# Patient Record
Sex: Female | Born: 1950 | Race: White | Hispanic: No | Smoking: Former smoker
Health system: Southern US, Community
[De-identification: ages and names within clinical notes are randomized; demographics above are authoritative.]

## PROBLEM LIST (undated history)

## (undated) DIAGNOSIS — F419 Anxiety disorder, unspecified: Secondary | ICD-10-CM

## (undated) DIAGNOSIS — R51 Headache: Secondary | ICD-10-CM

## (undated) DIAGNOSIS — M549 Dorsalgia, unspecified: Secondary | ICD-10-CM

## (undated) DIAGNOSIS — C50919 Malignant neoplasm of unspecified site of unspecified female breast: Secondary | ICD-10-CM

## (undated) DIAGNOSIS — K2210 Ulcer of esophagus without bleeding: Secondary | ICD-10-CM

## (undated) DIAGNOSIS — F410 Panic disorder [episodic paroxysmal anxiety] without agoraphobia: Secondary | ICD-10-CM

## (undated) DIAGNOSIS — K602 Anal fissure, unspecified: Secondary | ICD-10-CM

## (undated) DIAGNOSIS — H269 Unspecified cataract: Secondary | ICD-10-CM

## (undated) DIAGNOSIS — H409 Unspecified glaucoma: Secondary | ICD-10-CM

## (undated) DIAGNOSIS — K5730 Diverticulosis of large intestine without perforation or abscess without bleeding: Secondary | ICD-10-CM

## (undated) DIAGNOSIS — I2510 Atherosclerotic heart disease of native coronary artery without angina pectoris: Secondary | ICD-10-CM

## (undated) DIAGNOSIS — R519 Headache, unspecified: Secondary | ICD-10-CM

## (undated) DIAGNOSIS — M5416 Radiculopathy, lumbar region: Secondary | ICD-10-CM

## (undated) DIAGNOSIS — G8929 Other chronic pain: Secondary | ICD-10-CM

## (undated) DIAGNOSIS — M48061 Spinal stenosis, lumbar region without neurogenic claudication: Secondary | ICD-10-CM

## (undated) DIAGNOSIS — E785 Hyperlipidemia, unspecified: Secondary | ICD-10-CM

## (undated) DIAGNOSIS — B3781 Candidal esophagitis: Secondary | ICD-10-CM

## (undated) DIAGNOSIS — K2970 Gastritis, unspecified, without bleeding: Secondary | ICD-10-CM

## (undated) DIAGNOSIS — K589 Irritable bowel syndrome without diarrhea: Secondary | ICD-10-CM

## (undated) DIAGNOSIS — K259 Gastric ulcer, unspecified as acute or chronic, without hemorrhage or perforation: Secondary | ICD-10-CM

## (undated) DIAGNOSIS — K219 Gastro-esophageal reflux disease without esophagitis: Secondary | ICD-10-CM

## (undated) DIAGNOSIS — Z923 Personal history of irradiation: Secondary | ICD-10-CM

## (undated) DIAGNOSIS — R17 Unspecified jaundice: Secondary | ICD-10-CM

## (undated) DIAGNOSIS — G629 Polyneuropathy, unspecified: Secondary | ICD-10-CM

## (undated) DIAGNOSIS — Z9221 Personal history of antineoplastic chemotherapy: Secondary | ICD-10-CM

## (undated) DIAGNOSIS — N189 Chronic kidney disease, unspecified: Secondary | ICD-10-CM

## (undated) DIAGNOSIS — F32A Depression, unspecified: Secondary | ICD-10-CM

## (undated) DIAGNOSIS — W540XXA Bitten by dog, initial encounter: Secondary | ICD-10-CM

## (undated) DIAGNOSIS — T7491XA Unspecified adult maltreatment, confirmed, initial encounter: Secondary | ICD-10-CM

## (undated) DIAGNOSIS — F329 Major depressive disorder, single episode, unspecified: Secondary | ICD-10-CM

## (undated) DIAGNOSIS — R52 Pain, unspecified: Secondary | ICD-10-CM

## (undated) DIAGNOSIS — G4730 Sleep apnea, unspecified: Secondary | ICD-10-CM

## (undated) DIAGNOSIS — K449 Diaphragmatic hernia without obstruction or gangrene: Secondary | ICD-10-CM

## (undated) DIAGNOSIS — I639 Cerebral infarction, unspecified: Secondary | ICD-10-CM

## (undated) DIAGNOSIS — I10 Essential (primary) hypertension: Secondary | ICD-10-CM

## (undated) DIAGNOSIS — E876 Hypokalemia: Secondary | ICD-10-CM

## (undated) HISTORY — DX: Diverticulosis of large intestine without perforation or abscess without bleeding: K57.30

## (undated) HISTORY — PX: BREAST RECONSTRUCTION: SHX9

## (undated) HISTORY — DX: Malignant neoplasm of unspecified site of unspecified female breast: C50.919

## (undated) HISTORY — DX: Unspecified cataract: H26.9

## (undated) HISTORY — DX: Spinal stenosis, lumbar region without neurogenic claudication: M48.061

## (undated) HISTORY — PX: BREAST LUMPECTOMY: SHX2

## (undated) HISTORY — DX: Atherosclerotic heart disease of native coronary artery without angina pectoris: I25.10

## (undated) HISTORY — DX: Unspecified glaucoma: H40.9

## (undated) HISTORY — PX: CATARACT EXTRACTION: SUR2

## (undated) HISTORY — DX: Gastric ulcer, unspecified as acute or chronic, without hemorrhage or perforation: K25.9

## (undated) HISTORY — DX: Irritable bowel syndrome, unspecified: K58.9

## (undated) HISTORY — DX: Anal fissure, unspecified: K60.2

## (undated) HISTORY — DX: Chronic kidney disease, unspecified: N18.9

## (undated) HISTORY — PX: CARDIAC CATHETERIZATION: SHX172

## (undated) HISTORY — DX: Gastritis, unspecified, without bleeding: K29.70

## (undated) HISTORY — DX: Gastro-esophageal reflux disease without esophagitis: K21.9

## (undated) HISTORY — PX: BREAST REDUCTION SURGERY: SHX8

## (undated) HISTORY — DX: Panic disorder (episodic paroxysmal anxiety): F41.0

## (undated) HISTORY — DX: Bitten by dog, initial encounter: W54.0XXA

## (undated) HISTORY — DX: Sleep apnea, unspecified: G47.30

## (undated) HISTORY — DX: Anxiety disorder, unspecified: F41.9

## (undated) HISTORY — DX: Diaphragmatic hernia without obstruction or gangrene: K44.9

## (undated) HISTORY — PX: CHOLECYSTECTOMY: SHX55

## (undated) HISTORY — PX: REDUCTION MAMMAPLASTY: SUR839

## (undated) HISTORY — DX: Unspecified jaundice: R17

## (undated) HISTORY — DX: Ulcer of esophagus without bleeding: K22.10

## (undated) HISTORY — DX: Essential (primary) hypertension: I10

## (undated) HISTORY — DX: Hyperlipidemia, unspecified: E78.5

## (undated) HISTORY — PX: INSERTION / PLACEMENT / REVISION NEUROSTIMULATOR: SUR720

## (undated) HISTORY — DX: Major depressive disorder, single episode, unspecified: F32.9

## (undated) HISTORY — DX: Candidal esophagitis: B37.81

## (undated) HISTORY — DX: Unspecified adult maltreatment, confirmed, initial encounter: T74.91XA

## (undated) HISTORY — PX: BLADDER REPAIR: SHX76

## (undated) HISTORY — PX: UPPER GASTROINTESTINAL ENDOSCOPY: SHX188

## (undated) HISTORY — DX: Depression, unspecified: F32.A

---

## 1985-09-12 HISTORY — PX: PARTIAL HYSTERECTOMY: SHX80

## 1999-09-26 ENCOUNTER — Emergency Department (HOSPITAL_COMMUNITY): Admission: EM | Admit: 1999-09-26 | Discharge: 1999-09-26 | Payer: Self-pay | Admitting: Emergency Medicine

## 1999-10-01 ENCOUNTER — Encounter: Payer: Self-pay | Admitting: Internal Medicine

## 1999-10-01 ENCOUNTER — Ambulatory Visit (HOSPITAL_COMMUNITY): Admission: RE | Admit: 1999-10-01 | Discharge: 1999-10-01 | Payer: Self-pay | Admitting: Internal Medicine

## 1999-10-20 ENCOUNTER — Emergency Department (HOSPITAL_COMMUNITY): Admission: EM | Admit: 1999-10-20 | Discharge: 1999-10-21 | Payer: Self-pay | Admitting: Emergency Medicine

## 1999-10-21 ENCOUNTER — Encounter: Payer: Self-pay | Admitting: Emergency Medicine

## 1999-10-22 ENCOUNTER — Emergency Department (HOSPITAL_COMMUNITY): Admission: EM | Admit: 1999-10-22 | Discharge: 1999-10-22 | Payer: Self-pay | Admitting: Emergency Medicine

## 1999-11-02 ENCOUNTER — Other Ambulatory Visit: Admission: RE | Admit: 1999-11-02 | Discharge: 1999-11-02 | Payer: Self-pay | Admitting: Obstetrics and Gynecology

## 1999-11-03 ENCOUNTER — Inpatient Hospital Stay (HOSPITAL_COMMUNITY): Admission: AD | Admit: 1999-11-03 | Discharge: 1999-11-07 | Payer: Self-pay | Admitting: Internal Medicine

## 1999-11-04 ENCOUNTER — Encounter: Payer: Self-pay | Admitting: Internal Medicine

## 1999-11-05 ENCOUNTER — Encounter: Payer: Self-pay | Admitting: Internal Medicine

## 1999-11-10 ENCOUNTER — Encounter: Payer: Self-pay | Admitting: Obstetrics and Gynecology

## 1999-11-10 ENCOUNTER — Ambulatory Visit (HOSPITAL_COMMUNITY): Admission: RE | Admit: 1999-11-10 | Discharge: 1999-11-10 | Payer: Self-pay | Admitting: Obstetrics and Gynecology

## 2000-01-31 ENCOUNTER — Emergency Department (HOSPITAL_COMMUNITY): Admission: EM | Admit: 2000-01-31 | Discharge: 2000-01-31 | Payer: Self-pay | Admitting: Emergency Medicine

## 2000-03-21 ENCOUNTER — Encounter: Payer: Self-pay | Admitting: Emergency Medicine

## 2000-03-21 ENCOUNTER — Emergency Department (HOSPITAL_COMMUNITY): Admission: EM | Admit: 2000-03-21 | Discharge: 2000-03-21 | Payer: Self-pay | Admitting: Emergency Medicine

## 2001-08-28 ENCOUNTER — Other Ambulatory Visit: Admission: RE | Admit: 2001-08-28 | Discharge: 2001-08-28 | Payer: Self-pay | Admitting: Obstetrics and Gynecology

## 2002-02-17 ENCOUNTER — Encounter: Payer: Self-pay | Admitting: Emergency Medicine

## 2002-02-17 ENCOUNTER — Inpatient Hospital Stay (HOSPITAL_COMMUNITY): Admission: EM | Admit: 2002-02-17 | Discharge: 2002-02-21 | Payer: Self-pay | Admitting: Emergency Medicine

## 2002-02-19 ENCOUNTER — Encounter: Payer: Self-pay | Admitting: Cardiovascular Disease

## 2002-02-22 ENCOUNTER — Ambulatory Visit (HOSPITAL_COMMUNITY): Admission: RE | Admit: 2002-02-22 | Discharge: 2002-02-22 | Payer: Self-pay | Admitting: *Deleted

## 2002-03-14 ENCOUNTER — Ambulatory Visit (HOSPITAL_COMMUNITY): Admission: RE | Admit: 2002-03-14 | Discharge: 2002-03-14 | Payer: Self-pay | Admitting: *Deleted

## 2002-04-17 ENCOUNTER — Encounter: Admission: RE | Admit: 2002-04-17 | Discharge: 2002-07-16 | Payer: Self-pay | Admitting: Internal Medicine

## 2003-01-27 ENCOUNTER — Emergency Department (HOSPITAL_COMMUNITY): Admission: EM | Admit: 2003-01-27 | Discharge: 2003-01-27 | Payer: Self-pay | Admitting: Emergency Medicine

## 2003-01-29 ENCOUNTER — Encounter: Payer: Self-pay | Admitting: Emergency Medicine

## 2003-01-29 ENCOUNTER — Ambulatory Visit (HOSPITAL_COMMUNITY): Admission: RE | Admit: 2003-01-29 | Discharge: 2003-01-29 | Payer: Self-pay | Admitting: Emergency Medicine

## 2004-04-06 ENCOUNTER — Ambulatory Visit (HOSPITAL_COMMUNITY): Admission: RE | Admit: 2004-04-06 | Discharge: 2004-04-06 | Payer: Self-pay | Admitting: *Deleted

## 2004-07-15 ENCOUNTER — Ambulatory Visit: Payer: Self-pay | Admitting: Internal Medicine

## 2004-09-09 ENCOUNTER — Ambulatory Visit: Payer: Self-pay | Admitting: Internal Medicine

## 2004-09-20 ENCOUNTER — Ambulatory Visit: Payer: Self-pay | Admitting: Internal Medicine

## 2004-09-23 ENCOUNTER — Ambulatory Visit: Payer: Self-pay | Admitting: Internal Medicine

## 2004-09-29 ENCOUNTER — Ambulatory Visit: Payer: Self-pay | Admitting: Internal Medicine

## 2004-11-04 ENCOUNTER — Ambulatory Visit: Payer: Self-pay | Admitting: Internal Medicine

## 2005-01-19 ENCOUNTER — Ambulatory Visit: Payer: Self-pay | Admitting: Internal Medicine

## 2005-01-28 ENCOUNTER — Ambulatory Visit: Payer: Self-pay | Admitting: Internal Medicine

## 2005-03-04 ENCOUNTER — Ambulatory Visit: Payer: Self-pay | Admitting: Internal Medicine

## 2005-03-14 ENCOUNTER — Ambulatory Visit: Payer: Self-pay | Admitting: Internal Medicine

## 2005-03-30 ENCOUNTER — Ambulatory Visit: Payer: Self-pay | Admitting: Internal Medicine

## 2005-04-28 ENCOUNTER — Ambulatory Visit: Payer: Self-pay | Admitting: Internal Medicine

## 2005-05-09 ENCOUNTER — Ambulatory Visit: Payer: Self-pay | Admitting: Internal Medicine

## 2005-05-10 ENCOUNTER — Ambulatory Visit (HOSPITAL_COMMUNITY): Admission: RE | Admit: 2005-05-10 | Discharge: 2005-05-10 | Payer: Self-pay | Admitting: Obstetrics and Gynecology

## 2005-05-18 ENCOUNTER — Ambulatory Visit: Payer: Self-pay | Admitting: Internal Medicine

## 2005-05-23 ENCOUNTER — Encounter: Admission: RE | Admit: 2005-05-23 | Discharge: 2005-05-23 | Payer: Self-pay | Admitting: Obstetrics and Gynecology

## 2005-06-20 ENCOUNTER — Ambulatory Visit: Payer: Self-pay | Admitting: Internal Medicine

## 2005-06-23 ENCOUNTER — Ambulatory Visit: Payer: Self-pay | Admitting: Internal Medicine

## 2005-06-29 ENCOUNTER — Ambulatory Visit: Payer: Self-pay | Admitting: Internal Medicine

## 2005-08-11 ENCOUNTER — Ambulatory Visit: Payer: Self-pay | Admitting: Internal Medicine

## 2005-09-07 ENCOUNTER — Ambulatory Visit: Payer: Self-pay | Admitting: Adult Health

## 2005-09-22 ENCOUNTER — Ambulatory Visit: Payer: Self-pay | Admitting: Internal Medicine

## 2005-12-17 ENCOUNTER — Ambulatory Visit: Payer: Self-pay | Admitting: Family Medicine

## 2006-01-03 ENCOUNTER — Ambulatory Visit: Payer: Self-pay | Admitting: Internal Medicine

## 2006-01-09 ENCOUNTER — Ambulatory Visit: Payer: Self-pay | Admitting: Internal Medicine

## 2006-03-10 ENCOUNTER — Ambulatory Visit: Payer: Self-pay | Admitting: Internal Medicine

## 2006-05-02 ENCOUNTER — Ambulatory Visit: Payer: Self-pay | Admitting: Internal Medicine

## 2006-07-05 ENCOUNTER — Ambulatory Visit (HOSPITAL_COMMUNITY): Admission: RE | Admit: 2006-07-05 | Discharge: 2006-07-05 | Payer: Self-pay | Admitting: Internal Medicine

## 2006-07-14 ENCOUNTER — Ambulatory Visit: Payer: Self-pay | Admitting: Internal Medicine

## 2006-07-17 ENCOUNTER — Encounter: Admission: RE | Admit: 2006-07-17 | Discharge: 2006-07-17 | Payer: Self-pay | Admitting: Internal Medicine

## 2006-07-31 ENCOUNTER — Encounter: Admission: RE | Admit: 2006-07-31 | Discharge: 2006-07-31 | Payer: Self-pay | Admitting: General Surgery

## 2006-07-31 ENCOUNTER — Encounter (INDEPENDENT_AMBULATORY_CARE_PROVIDER_SITE_OTHER): Payer: Self-pay | Admitting: *Deleted

## 2006-08-30 ENCOUNTER — Ambulatory Visit: Payer: Self-pay | Admitting: Internal Medicine

## 2006-09-18 ENCOUNTER — Ambulatory Visit: Payer: Self-pay | Admitting: Internal Medicine

## 2006-09-26 ENCOUNTER — Ambulatory Visit: Payer: Self-pay | Admitting: Internal Medicine

## 2006-10-10 ENCOUNTER — Ambulatory Visit: Payer: Self-pay | Admitting: Internal Medicine

## 2006-10-23 ENCOUNTER — Ambulatory Visit: Payer: Self-pay | Admitting: Internal Medicine

## 2006-11-22 ENCOUNTER — Ambulatory Visit: Payer: Self-pay | Admitting: Internal Medicine

## 2006-12-15 ENCOUNTER — Encounter: Admission: RE | Admit: 2006-12-15 | Discharge: 2006-12-15 | Payer: Self-pay | Admitting: General Surgery

## 2007-02-13 ENCOUNTER — Ambulatory Visit: Payer: Self-pay | Admitting: Family Medicine

## 2007-02-19 ENCOUNTER — Ambulatory Visit: Payer: Self-pay | Admitting: Internal Medicine

## 2007-02-21 ENCOUNTER — Ambulatory Visit: Payer: Self-pay | Admitting: Internal Medicine

## 2007-02-22 ENCOUNTER — Emergency Department (HOSPITAL_COMMUNITY): Admission: EM | Admit: 2007-02-22 | Discharge: 2007-02-22 | Payer: Self-pay | Admitting: Emergency Medicine

## 2007-03-29 DIAGNOSIS — F411 Generalized anxiety disorder: Secondary | ICD-10-CM | POA: Insufficient documentation

## 2007-03-29 DIAGNOSIS — K5730 Diverticulosis of large intestine without perforation or abscess without bleeding: Secondary | ICD-10-CM | POA: Insufficient documentation

## 2007-03-29 DIAGNOSIS — I10 Essential (primary) hypertension: Secondary | ICD-10-CM | POA: Insufficient documentation

## 2007-03-29 DIAGNOSIS — I2510 Atherosclerotic heart disease of native coronary artery without angina pectoris: Secondary | ICD-10-CM | POA: Insufficient documentation

## 2007-05-15 ENCOUNTER — Telehealth (INDEPENDENT_AMBULATORY_CARE_PROVIDER_SITE_OTHER): Payer: Self-pay | Admitting: *Deleted

## 2007-05-17 ENCOUNTER — Ambulatory Visit: Payer: Self-pay | Admitting: Internal Medicine

## 2007-05-18 ENCOUNTER — Telehealth (INDEPENDENT_AMBULATORY_CARE_PROVIDER_SITE_OTHER): Payer: Self-pay | Admitting: Internal Medicine

## 2007-05-25 ENCOUNTER — Ambulatory Visit: Payer: Self-pay | Admitting: Internal Medicine

## 2007-05-30 ENCOUNTER — Encounter (INDEPENDENT_AMBULATORY_CARE_PROVIDER_SITE_OTHER): Payer: Self-pay | Admitting: *Deleted

## 2007-06-01 ENCOUNTER — Ambulatory Visit: Payer: Self-pay | Admitting: Internal Medicine

## 2007-06-09 ENCOUNTER — Emergency Department (HOSPITAL_COMMUNITY): Admission: EM | Admit: 2007-06-09 | Discharge: 2007-06-09 | Payer: Self-pay | Admitting: Emergency Medicine

## 2007-06-14 ENCOUNTER — Telehealth (INDEPENDENT_AMBULATORY_CARE_PROVIDER_SITE_OTHER): Payer: Self-pay | Admitting: Internal Medicine

## 2007-06-15 ENCOUNTER — Ambulatory Visit: Payer: Self-pay | Admitting: Internal Medicine

## 2007-06-15 LAB — CONVERTED CEMR LAB: Rapid Strep: NEGATIVE

## 2007-06-18 ENCOUNTER — Ambulatory Visit: Payer: Self-pay | Admitting: *Deleted

## 2007-07-25 ENCOUNTER — Telehealth (INDEPENDENT_AMBULATORY_CARE_PROVIDER_SITE_OTHER): Payer: Self-pay | Admitting: Internal Medicine

## 2007-08-21 ENCOUNTER — Ambulatory Visit: Payer: Self-pay | Admitting: Internal Medicine

## 2007-08-21 DIAGNOSIS — K602 Anal fissure, unspecified: Secondary | ICD-10-CM | POA: Insufficient documentation

## 2007-08-24 ENCOUNTER — Encounter: Payer: Self-pay | Admitting: Internal Medicine

## 2007-08-29 ENCOUNTER — Encounter: Admission: RE | Admit: 2007-08-29 | Discharge: 2007-08-29 | Payer: Self-pay | Admitting: Family Medicine

## 2007-08-29 ENCOUNTER — Encounter (INDEPENDENT_AMBULATORY_CARE_PROVIDER_SITE_OTHER): Payer: Self-pay | Admitting: Diagnostic Radiology

## 2007-08-31 ENCOUNTER — Telehealth (INDEPENDENT_AMBULATORY_CARE_PROVIDER_SITE_OTHER): Payer: Self-pay | Admitting: Internal Medicine

## 2007-09-04 ENCOUNTER — Encounter: Payer: Self-pay | Admitting: Family Medicine

## 2007-09-04 ENCOUNTER — Telehealth (INDEPENDENT_AMBULATORY_CARE_PROVIDER_SITE_OTHER): Payer: Self-pay | Admitting: *Deleted

## 2007-09-07 ENCOUNTER — Encounter: Admission: RE | Admit: 2007-09-07 | Discharge: 2007-09-07 | Payer: Self-pay | Admitting: Family Medicine

## 2007-09-10 ENCOUNTER — Encounter: Payer: Self-pay | Admitting: Internal Medicine

## 2007-09-10 ENCOUNTER — Encounter (INDEPENDENT_AMBULATORY_CARE_PROVIDER_SITE_OTHER): Payer: Self-pay | Admitting: Internal Medicine

## 2007-09-10 ENCOUNTER — Ambulatory Visit: Payer: Self-pay | Admitting: Oncology

## 2007-09-11 ENCOUNTER — Ambulatory Visit: Payer: Self-pay | Admitting: Internal Medicine

## 2007-09-11 DIAGNOSIS — B349 Viral infection, unspecified: Secondary | ICD-10-CM | POA: Insufficient documentation

## 2007-09-13 DIAGNOSIS — C50919 Malignant neoplasm of unspecified site of unspecified female breast: Secondary | ICD-10-CM

## 2007-09-13 HISTORY — DX: Malignant neoplasm of unspecified site of unspecified female breast: C50.919

## 2007-09-17 ENCOUNTER — Ambulatory Visit (HOSPITAL_COMMUNITY): Admission: RE | Admit: 2007-09-17 | Discharge: 2007-09-17 | Payer: Self-pay | Admitting: Oncology

## 2007-09-18 ENCOUNTER — Encounter: Payer: Self-pay | Admitting: Internal Medicine

## 2007-09-18 LAB — COMPREHENSIVE METABOLIC PANEL
ALT: 17 U/L (ref 0–35)
Alkaline Phosphatase: 56 U/L (ref 39–117)
CO2: 28 mEq/L (ref 19–32)
Creatinine, Ser: 0.89 mg/dL (ref 0.40–1.20)
Total Bilirubin: 0.6 mg/dL (ref 0.3–1.2)

## 2007-09-18 LAB — CBC WITH DIFFERENTIAL/PLATELET
EOS%: 1.6 % (ref 0.0–7.0)
Eosinophils Absolute: 0.1 10*3/uL (ref 0.0–0.5)
LYMPH%: 32.4 % (ref 14.0–48.0)
MCH: 31 pg (ref 26.0–34.0)
MCHC: 34.5 g/dL (ref 32.0–36.0)
MCV: 90 fL (ref 81.0–101.0)
MONO%: 8.7 % (ref 0.0–13.0)
NEUT#: 2.8 10*3/uL (ref 1.5–6.5)
Platelets: 239 10*3/uL (ref 145–400)
RBC: 4.26 10*6/uL (ref 3.70–5.32)
RDW: 13.4 % (ref 11.3–14.5)

## 2007-09-18 LAB — LACTATE DEHYDROGENASE: LDH: 192 U/L (ref 94–250)

## 2007-09-19 ENCOUNTER — Ambulatory Visit (HOSPITAL_COMMUNITY): Admission: RE | Admit: 2007-09-19 | Discharge: 2007-09-19 | Payer: Self-pay | Admitting: Oncology

## 2007-09-20 ENCOUNTER — Ambulatory Visit: Payer: Self-pay

## 2007-09-20 ENCOUNTER — Encounter: Payer: Self-pay | Admitting: Oncology

## 2007-09-21 ENCOUNTER — Ambulatory Visit (HOSPITAL_COMMUNITY): Admission: RE | Admit: 2007-09-21 | Discharge: 2007-09-21 | Payer: Self-pay | Admitting: Oncology

## 2007-09-21 LAB — VITAMIN D PNL(25-HYDRXY+1,25-DIHY)-BLD: Vit D, 25-Hydroxy: 25 ng/mL — ABNORMAL LOW (ref 30–89)

## 2007-09-22 ENCOUNTER — Encounter (INDEPENDENT_AMBULATORY_CARE_PROVIDER_SITE_OTHER): Payer: Self-pay | Admitting: Internal Medicine

## 2007-09-24 ENCOUNTER — Ambulatory Visit: Payer: Self-pay | Admitting: Family Medicine

## 2007-09-24 ENCOUNTER — Telehealth (INDEPENDENT_AMBULATORY_CARE_PROVIDER_SITE_OTHER): Payer: Self-pay | Admitting: Family Medicine

## 2007-09-24 DIAGNOSIS — IMO0002 Reserved for concepts with insufficient information to code with codable children: Secondary | ICD-10-CM | POA: Insufficient documentation

## 2007-09-25 ENCOUNTER — Encounter (INDEPENDENT_AMBULATORY_CARE_PROVIDER_SITE_OTHER): Payer: Self-pay | Admitting: Family Medicine

## 2007-09-28 ENCOUNTER — Ambulatory Visit: Payer: Self-pay | Admitting: Internal Medicine

## 2007-09-28 ENCOUNTER — Telehealth: Payer: Self-pay | Admitting: Internal Medicine

## 2007-09-28 DIAGNOSIS — C50211 Malignant neoplasm of upper-inner quadrant of right female breast: Secondary | ICD-10-CM | POA: Insufficient documentation

## 2007-09-28 DIAGNOSIS — F329 Major depressive disorder, single episode, unspecified: Secondary | ICD-10-CM | POA: Insufficient documentation

## 2007-10-01 ENCOUNTER — Encounter (INDEPENDENT_AMBULATORY_CARE_PROVIDER_SITE_OTHER): Payer: Self-pay | Admitting: Family Medicine

## 2007-10-03 ENCOUNTER — Telehealth: Payer: Self-pay | Admitting: Internal Medicine

## 2007-10-03 ENCOUNTER — Ambulatory Visit: Payer: Self-pay | Admitting: Internal Medicine

## 2007-10-03 DIAGNOSIS — J019 Acute sinusitis, unspecified: Secondary | ICD-10-CM | POA: Insufficient documentation

## 2007-10-03 DIAGNOSIS — J069 Acute upper respiratory infection, unspecified: Secondary | ICD-10-CM | POA: Insufficient documentation

## 2007-10-03 DIAGNOSIS — Z888 Allergy status to other drugs, medicaments and biological substances status: Secondary | ICD-10-CM | POA: Insufficient documentation

## 2007-10-04 ENCOUNTER — Ambulatory Visit: Payer: Self-pay | Admitting: Psychiatry

## 2007-10-05 ENCOUNTER — Ambulatory Visit (HOSPITAL_BASED_OUTPATIENT_CLINIC_OR_DEPARTMENT_OTHER): Admission: RE | Admit: 2007-10-05 | Discharge: 2007-10-05 | Payer: Self-pay | Admitting: General Surgery

## 2007-10-09 ENCOUNTER — Ambulatory Visit: Payer: Self-pay | Admitting: Cardiology

## 2007-10-11 ENCOUNTER — Ambulatory Visit: Payer: Self-pay

## 2007-10-16 ENCOUNTER — Ambulatory Visit: Payer: Self-pay | Admitting: Psychiatry

## 2007-10-16 LAB — CBC WITH DIFFERENTIAL/PLATELET
BASO%: 0 % (ref 0.0–2.0)
EOS%: 0.2 % (ref 0.0–7.0)
MCH: 31.1 pg (ref 26.0–34.0)
MCHC: 34.6 g/dL (ref 32.0–36.0)
NEUT%: 93.9 % — ABNORMAL HIGH (ref 39.6–76.8)
RDW: 13.4 % (ref 11.3–14.5)
lymph#: 1 10*3/uL (ref 0.9–3.3)

## 2007-10-25 LAB — CBC WITH DIFFERENTIAL/PLATELET
Basophils Absolute: 0.1 10*3/uL (ref 0.0–0.1)
EOS%: 0.2 % (ref 0.0–7.0)
HGB: 12.4 g/dL (ref 11.6–15.9)
MCH: 28.9 pg (ref 26.0–34.0)
NEUT#: 15.9 10*3/uL — ABNORMAL HIGH (ref 1.5–6.5)
RBC: 4.3 10*6/uL (ref 3.70–5.32)
RDW: 13 % (ref 11.3–14.5)
lymph#: 2.1 10*3/uL (ref 0.9–3.3)

## 2007-10-25 LAB — BASIC METABOLIC PANEL
BUN: 14 mg/dL (ref 6–23)
CO2: 29 mEq/L (ref 19–32)
Chloride: 104 mEq/L (ref 96–112)
Creatinine, Ser: 0.9 mg/dL (ref 0.40–1.20)
Potassium: 4.1 mEq/L (ref 3.5–5.3)

## 2007-10-26 ENCOUNTER — Ambulatory Visit: Payer: Self-pay | Admitting: Oncology

## 2007-10-31 LAB — COMPREHENSIVE METABOLIC PANEL
AST: 21 U/L (ref 0–37)
Albumin: 3.5 g/dL (ref 3.5–5.2)
Alkaline Phosphatase: 129 U/L — ABNORMAL HIGH (ref 39–117)
BUN: 20 mg/dL (ref 6–23)
Calcium: 9.4 mg/dL (ref 8.4–10.5)
Creatinine, Ser: 0.74 mg/dL (ref 0.40–1.20)
Glucose, Bld: 118 mg/dL — ABNORMAL HIGH (ref 70–99)

## 2007-10-31 LAB — CBC WITH DIFFERENTIAL/PLATELET
Basophils Absolute: 0 10*3/uL (ref 0.0–0.1)
EOS%: 0.1 % (ref 0.0–7.0)
Eosinophils Absolute: 0 10*3/uL (ref 0.0–0.5)
HCT: 33.1 % — ABNORMAL LOW (ref 34.8–46.6)
HGB: 11.7 g/dL (ref 11.6–15.9)
MCH: 31.3 pg (ref 26.0–34.0)
MCV: 88.8 fL (ref 81.0–101.0)
NEUT#: 8 10*3/uL — ABNORMAL HIGH (ref 1.5–6.5)
NEUT%: 89.3 % — ABNORMAL HIGH (ref 39.6–76.8)
RDW: 13.3 % (ref 11.3–14.5)
lymph#: 0.9 10*3/uL (ref 0.9–3.3)

## 2007-11-02 ENCOUNTER — Encounter: Payer: Self-pay | Admitting: Internal Medicine

## 2007-11-02 LAB — CBC WITH DIFFERENTIAL/PLATELET
Basophils Absolute: 0 10*3/uL (ref 0.0–0.1)
EOS%: 0.1 % (ref 0.0–7.0)
Eosinophils Absolute: 0 10*3/uL (ref 0.0–0.5)
HGB: 11.1 g/dL — ABNORMAL LOW (ref 11.6–15.9)
LYMPH%: 37.6 % (ref 14.0–48.0)
MCH: 31 pg (ref 26.0–34.0)
MCV: 88.2 fL (ref 81.0–101.0)
MONO%: 0.7 % (ref 0.0–13.0)
NEUT#: 0.9 10*3/uL — ABNORMAL LOW (ref 1.5–6.5)
NEUT%: 60.9 % (ref 39.6–76.8)
Platelets: 96 10*3/uL — ABNORMAL LOW (ref 145–400)

## 2007-11-05 ENCOUNTER — Ambulatory Visit: Payer: Self-pay | Admitting: Psychiatry

## 2007-11-15 LAB — CBC WITH DIFFERENTIAL/PLATELET
EOS%: 0.2 % (ref 0.0–7.0)
Eosinophils Absolute: 0 10*3/uL (ref 0.0–0.5)
LYMPH%: 15 % (ref 14.0–48.0)
MCH: 30.7 pg (ref 26.0–34.0)
MCHC: 34.6 g/dL (ref 32.0–36.0)
MCV: 88.8 fL (ref 81.0–101.0)
MONO%: 7.1 % (ref 0.0–13.0)
NEUT#: 6.9 10*3/uL — ABNORMAL HIGH (ref 1.5–6.5)
Platelets: 158 10*3/uL (ref 145–400)
RBC: 3.62 10*6/uL — ABNORMAL LOW (ref 3.70–5.32)

## 2007-11-15 LAB — COMPREHENSIVE METABOLIC PANEL
ALT: 19 U/L (ref 0–35)
AST: 15 U/L (ref 0–37)
Albumin: 4.3 g/dL (ref 3.5–5.2)
Alkaline Phosphatase: 66 U/L (ref 39–117)
BUN: 18 mg/dL (ref 6–23)
CO2: 28 mEq/L (ref 19–32)
Calcium: 9.7 mg/dL (ref 8.4–10.5)
Glucose, Bld: 107 mg/dL — ABNORMAL HIGH (ref 70–99)
Total Bilirubin: 0.5 mg/dL (ref 0.3–1.2)
Total Protein: 6.7 g/dL (ref 6.0–8.3)

## 2007-11-20 LAB — CBC WITH DIFFERENTIAL/PLATELET
BASO%: 0 % (ref 0.0–2.0)
Basophils Absolute: 0 10*3/uL (ref 0.0–0.1)
EOS%: 0 % (ref 0.0–7.0)
Eosinophils Absolute: 0 10*3/uL (ref 0.0–0.5)
HCT: 30.8 % — ABNORMAL LOW (ref 34.8–46.6)
HGB: 10.7 g/dL — ABNORMAL LOW (ref 11.6–15.9)
LYMPH%: 1.8 % — ABNORMAL LOW (ref 14.0–48.0)
MCH: 31.1 pg (ref 26.0–34.0)
MCHC: 34.6 g/dL (ref 32.0–36.0)
MCV: 89.7 fL (ref 81.0–101.0)
MONO#: 0.1 10*3/uL (ref 0.1–0.9)
MONO%: 0.3 % (ref 0.0–13.0)
NEUT#: 36.8 10*3/uL — ABNORMAL HIGH (ref 1.5–6.5)
NEUT%: 97.9 % — ABNORMAL HIGH (ref 39.6–76.8)
Platelets: 170 10*3/uL (ref 145–400)
RBC: 3.43 10*6/uL — ABNORMAL LOW (ref 3.70–5.32)
RDW: 14.2 % (ref 11.3–14.5)
WBC: 37.6 10*3/uL — ABNORMAL HIGH (ref 3.9–10.0)
lymph#: 0.7 10*3/uL — ABNORMAL LOW (ref 0.9–3.3)

## 2007-11-23 LAB — COMPREHENSIVE METABOLIC PANEL
ALT: 47 U/L — ABNORMAL HIGH (ref 0–35)
AST: 26 U/L (ref 0–37)
Albumin: 4.1 g/dL (ref 3.5–5.2)
Calcium: 9.2 mg/dL (ref 8.4–10.5)
Chloride: 100 mEq/L (ref 96–112)
Potassium: 3.4 mEq/L — ABNORMAL LOW (ref 3.5–5.3)

## 2007-11-23 LAB — CBC WITH DIFFERENTIAL/PLATELET
BASO%: 1.3 % (ref 0.0–2.0)
EOS%: 0.6 % (ref 0.0–7.0)
HGB: 9.6 g/dL — ABNORMAL LOW (ref 11.6–15.9)
MCH: 31.1 pg (ref 26.0–34.0)
MCHC: 35.3 g/dL (ref 32.0–36.0)
RDW: 14.2 % (ref 11.3–14.5)
lymph#: 0.6 10*3/uL — ABNORMAL LOW (ref 0.9–3.3)

## 2007-11-26 ENCOUNTER — Ambulatory Visit: Payer: Self-pay | Admitting: Internal Medicine

## 2007-11-26 DIAGNOSIS — J45909 Unspecified asthma, uncomplicated: Secondary | ICD-10-CM | POA: Insufficient documentation

## 2007-11-27 ENCOUNTER — Telehealth: Payer: Self-pay | Admitting: Internal Medicine

## 2007-11-29 ENCOUNTER — Encounter (INDEPENDENT_AMBULATORY_CARE_PROVIDER_SITE_OTHER): Payer: Self-pay | Admitting: Internal Medicine

## 2007-11-29 ENCOUNTER — Ambulatory Visit: Payer: Self-pay | Admitting: Psychiatry

## 2007-12-05 ENCOUNTER — Encounter: Payer: Self-pay | Admitting: Internal Medicine

## 2007-12-05 ENCOUNTER — Ambulatory Visit: Payer: Self-pay | Admitting: Oncology

## 2007-12-11 ENCOUNTER — Encounter: Admission: RE | Admit: 2007-12-11 | Discharge: 2007-12-11 | Payer: Self-pay | Admitting: Oncology

## 2007-12-12 LAB — COMPREHENSIVE METABOLIC PANEL
ALT: 62 U/L — ABNORMAL HIGH (ref 0–35)
AST: 39 U/L — ABNORMAL HIGH (ref 0–37)
Albumin: 3.4 g/dL — ABNORMAL LOW (ref 3.5–5.2)
Alkaline Phosphatase: 79 U/L (ref 39–117)
BUN: 12 mg/dL (ref 6–23)
Calcium: 8.7 mg/dL (ref 8.4–10.5)
Chloride: 100 mEq/L (ref 96–112)
Potassium: 3.4 mEq/L — ABNORMAL LOW (ref 3.5–5.3)
Sodium: 135 mEq/L (ref 135–145)

## 2007-12-12 LAB — CBC WITH DIFFERENTIAL/PLATELET
BASO%: 0.2 % (ref 0.0–2.0)
Basophils Absolute: 0 10*3/uL (ref 0.0–0.1)
EOS%: 0.2 % (ref 0.0–7.0)
HGB: 9.8 g/dL — ABNORMAL LOW (ref 11.6–15.9)
MCH: 31.2 pg (ref 26.0–34.0)
MCHC: 33.7 g/dL (ref 32.0–36.0)
MCV: 92.6 fL (ref 81.0–101.0)
MONO%: 1.7 % (ref 0.0–13.0)
RBC: 3.14 10*6/uL — ABNORMAL LOW (ref 3.70–5.32)
RDW: 16.6 % — ABNORMAL HIGH (ref 11.3–14.5)
lymph#: 0.7 10*3/uL — ABNORMAL LOW (ref 0.9–3.3)

## 2007-12-14 LAB — CBC WITH DIFFERENTIAL/PLATELET
BASO%: 0.5 % (ref 0.0–2.0)
Basophils Absolute: 0 10*3/uL (ref 0.0–0.1)
EOS%: 0.9 % (ref 0.0–7.0)
HGB: 9.2 g/dL — ABNORMAL LOW (ref 11.6–15.9)
MCH: 31.8 pg (ref 26.0–34.0)
MCHC: 35.1 g/dL (ref 32.0–36.0)
MCV: 90.7 fL (ref 81.0–101.0)
MONO%: 1.8 % (ref 0.0–13.0)
NEUT%: 64 % (ref 39.6–76.8)
RDW: 18.4 % — ABNORMAL HIGH (ref 11.3–14.5)

## 2007-12-17 ENCOUNTER — Ambulatory Visit: Payer: Self-pay | Admitting: Psychiatry

## 2007-12-18 ENCOUNTER — Encounter: Payer: Self-pay | Admitting: Internal Medicine

## 2007-12-26 ENCOUNTER — Ambulatory Visit: Payer: Self-pay | Admitting: Cardiology

## 2007-12-26 ENCOUNTER — Ambulatory Visit: Payer: Self-pay | Admitting: Internal Medicine

## 2007-12-27 LAB — CBC WITH DIFFERENTIAL/PLATELET
BASO%: 0.3 % (ref 0.0–2.0)
EOS%: 0.1 % (ref 0.0–7.0)
MCH: 32.5 pg (ref 26.0–34.0)
MCV: 92.3 fL (ref 81.0–101.0)
MONO%: 9.2 % (ref 0.0–13.0)
NEUT#: 5.2 10*3/uL (ref 1.5–6.5)
RBC: 2.89 10*6/uL — ABNORMAL LOW (ref 3.70–5.32)
RDW: 19.3 % — ABNORMAL HIGH (ref 11.3–14.5)
lymph#: 0.9 10*3/uL (ref 0.9–3.3)

## 2007-12-28 LAB — COMPREHENSIVE METABOLIC PANEL
ALT: 20 U/L (ref 0–35)
AST: 17 U/L (ref 0–37)
Albumin: 4.1 g/dL (ref 3.5–5.2)
Alkaline Phosphatase: 70 U/L (ref 39–117)
Potassium: 3.4 mEq/L — ABNORMAL LOW (ref 3.5–5.3)
Sodium: 141 mEq/L (ref 135–145)
Total Protein: 6.5 g/dL (ref 6.0–8.3)

## 2008-01-01 ENCOUNTER — Telehealth: Payer: Self-pay | Admitting: Internal Medicine

## 2008-01-04 ENCOUNTER — Encounter: Payer: Self-pay | Admitting: Internal Medicine

## 2008-01-04 LAB — CBC WITH DIFFERENTIAL/PLATELET
Basophils Absolute: 0 10*3/uL (ref 0.0–0.1)
EOS%: 0.1 % (ref 0.0–7.0)
Eosinophils Absolute: 0 10*3/uL (ref 0.0–0.5)
HGB: 10 g/dL — ABNORMAL LOW (ref 11.6–15.9)
LYMPH%: 4.8 % — ABNORMAL LOW (ref 14.0–48.0)
MCH: 32.5 pg (ref 26.0–34.0)
MCV: 94 fL (ref 81.0–101.0)
MONO%: 3.4 % (ref 0.0–13.0)
NEUT#: 30.2 10*3/uL — ABNORMAL HIGH (ref 1.5–6.5)
Platelets: 149 10*3/uL (ref 145–400)

## 2008-01-08 LAB — CBC WITH DIFFERENTIAL/PLATELET
BASO%: 0.3 % (ref 0.0–2.0)
EOS%: 0.1 % (ref 0.0–7.0)
LYMPH%: 5.3 % — ABNORMAL LOW (ref 14.0–48.0)
MCH: 31.6 pg (ref 26.0–34.0)
MCHC: 32.7 g/dL (ref 32.0–36.0)
MCV: 96.4 fL (ref 81.0–101.0)
MONO%: 2.7 % (ref 0.0–13.0)
Platelets: 140 10*3/uL — ABNORMAL LOW (ref 145–400)
RBC: 3.41 10*6/uL — ABNORMAL LOW (ref 3.70–5.32)
RDW: 18.1 % — ABNORMAL HIGH (ref 11.3–14.5)

## 2008-01-08 LAB — PROTIME-INR: Protime: 12 Seconds (ref 10.6–13.4)

## 2008-01-09 ENCOUNTER — Ambulatory Visit: Payer: Self-pay | Admitting: Psychiatry

## 2008-01-12 ENCOUNTER — Ambulatory Visit: Payer: Self-pay | Admitting: Internal Medicine

## 2008-01-12 ENCOUNTER — Telehealth: Payer: Self-pay | Admitting: Internal Medicine

## 2008-01-15 ENCOUNTER — Ambulatory Visit: Payer: Self-pay | Admitting: Oncology

## 2008-01-15 ENCOUNTER — Ambulatory Visit: Payer: Self-pay | Admitting: Psychiatry

## 2008-01-15 ENCOUNTER — Ambulatory Visit: Payer: Self-pay | Admitting: Internal Medicine

## 2008-01-17 ENCOUNTER — Encounter: Payer: Self-pay | Admitting: Internal Medicine

## 2008-01-17 LAB — COMPREHENSIVE METABOLIC PANEL
ALT: 20 U/L (ref 0–35)
CO2: 27 mEq/L (ref 19–32)
Calcium: 9.4 mg/dL (ref 8.4–10.5)
Chloride: 103 mEq/L (ref 96–112)
Creatinine, Ser: 0.85 mg/dL (ref 0.40–1.20)
Sodium: 141 mEq/L (ref 135–145)
Total Protein: 6.1 g/dL (ref 6.0–8.3)

## 2008-01-17 LAB — CBC WITH DIFFERENTIAL/PLATELET
BASO%: 1.9 % (ref 0.0–2.0)
Eosinophils Absolute: 0 10*3/uL (ref 0.0–0.5)
HCT: 26 % — ABNORMAL LOW (ref 34.8–46.6)
MCHC: 34.8 g/dL (ref 32.0–36.0)
MONO#: 0.4 10*3/uL (ref 0.1–0.9)
NEUT#: 3.1 10*3/uL (ref 1.5–6.5)
NEUT%: 69.4 % (ref 39.6–76.8)
WBC: 4.4 10*3/uL (ref 3.9–10.0)
lymph#: 0.8 10*3/uL — ABNORMAL LOW (ref 0.9–3.3)

## 2008-01-23 ENCOUNTER — Ambulatory Visit: Payer: Self-pay | Admitting: Internal Medicine

## 2008-01-25 ENCOUNTER — Encounter: Payer: Self-pay | Admitting: Internal Medicine

## 2008-01-25 LAB — CBC WITH DIFFERENTIAL/PLATELET
BASO%: 1.4 % (ref 0.0–2.0)
MCHC: 33.9 g/dL (ref 32.0–36.0)
MONO#: 0.1 10*3/uL (ref 0.1–0.9)
RBC: 2.78 10*6/uL — ABNORMAL LOW (ref 3.70–5.32)
RDW: 15.9 % — ABNORMAL HIGH (ref 11.3–14.5)
WBC: 1.4 10*3/uL — ABNORMAL LOW (ref 3.9–10.0)
lymph#: 0.6 10*3/uL — ABNORMAL LOW (ref 0.9–3.3)

## 2008-01-28 ENCOUNTER — Ambulatory Visit: Payer: Self-pay | Admitting: Psychiatry

## 2008-01-31 ENCOUNTER — Encounter: Payer: Self-pay | Admitting: Internal Medicine

## 2008-02-06 ENCOUNTER — Encounter: Payer: Self-pay | Admitting: Internal Medicine

## 2008-02-14 ENCOUNTER — Ambulatory Visit: Payer: Self-pay | Admitting: Oncology

## 2008-02-14 ENCOUNTER — Inpatient Hospital Stay (HOSPITAL_COMMUNITY): Admission: AD | Admit: 2008-02-14 | Discharge: 2008-02-18 | Payer: Self-pay | Admitting: Oncology

## 2008-02-14 ENCOUNTER — Ambulatory Visit: Payer: Self-pay | Admitting: Psychiatry

## 2008-02-14 LAB — CBC WITH DIFFERENTIAL/PLATELET
Eosinophils Absolute: 0 10*3/uL (ref 0.0–0.5)
HCT: 29.5 % — ABNORMAL LOW (ref 34.8–46.6)
LYMPH%: 75.8 % — ABNORMAL HIGH (ref 14.0–48.0)
MCV: 99.9 fL (ref 81.0–101.0)
MONO#: 0.1 10*3/uL (ref 0.1–0.9)
NEUT#: 0.1 10*3/uL — CL (ref 1.5–6.5)
NEUT%: 10.7 % — ABNORMAL LOW (ref 39.6–76.8)
Platelets: 113 10*3/uL — ABNORMAL LOW (ref 145–400)
WBC: 0.8 10*3/uL — CL (ref 3.9–10.0)

## 2008-02-25 ENCOUNTER — Ambulatory Visit: Payer: Self-pay | Admitting: Psychiatry

## 2008-02-28 LAB — COMPREHENSIVE METABOLIC PANEL
ALT: 13 U/L (ref 0–35)
AST: 14 U/L (ref 0–37)
CO2: 30 mEq/L (ref 19–32)
Creatinine, Ser: 0.75 mg/dL (ref 0.40–1.20)
Total Bilirubin: 0.4 mg/dL (ref 0.3–1.2)

## 2008-02-28 LAB — CBC WITH DIFFERENTIAL/PLATELET
BASO%: 0.8 % (ref 0.0–2.0)
EOS%: 0.2 % (ref 0.0–7.0)
HCT: 28.2 % — ABNORMAL LOW (ref 34.8–46.6)
LYMPH%: 27.4 % (ref 14.0–48.0)
MCH: 33.8 pg (ref 26.0–34.0)
MCHC: 34.9 g/dL (ref 32.0–36.0)
MCV: 96.8 fL (ref 81.0–101.0)
MONO%: 12.9 % (ref 0.0–13.0)
NEUT%: 58.7 % (ref 39.6–76.8)
Platelets: 242 10*3/uL (ref 145–400)

## 2008-02-29 ENCOUNTER — Ambulatory Visit: Payer: Self-pay | Admitting: Oncology

## 2008-03-04 ENCOUNTER — Ambulatory Visit: Payer: Self-pay | Admitting: Oncology

## 2008-03-06 LAB — CBC WITH DIFFERENTIAL/PLATELET
BASO%: 0.3 % (ref 0.0–2.0)
Eosinophils Absolute: 0.1 10*3/uL (ref 0.0–0.5)
HCT: 30.4 % — ABNORMAL LOW (ref 34.8–46.6)
LYMPH%: 30.3 % (ref 14.0–48.0)
MCHC: 34.3 g/dL (ref 32.0–36.0)
MONO#: 0.5 10*3/uL (ref 0.1–0.9)
NEUT%: 52.1 % (ref 39.6–76.8)
Platelets: 142 10*3/uL — ABNORMAL LOW (ref 145–400)
WBC: 3.5 10*3/uL — ABNORMAL LOW (ref 3.9–10.0)

## 2008-03-10 LAB — CBC WITH DIFFERENTIAL/PLATELET
BASO%: 0 % (ref 0.0–2.0)
Basophils Absolute: 0 10*3/uL (ref 0.0–0.1)
Eosinophils Absolute: 0 10*3/uL (ref 0.0–0.5)
HCT: 31.2 % — ABNORMAL LOW (ref 34.8–46.6)
HGB: 10.7 g/dL — ABNORMAL LOW (ref 11.6–15.9)
LYMPH%: 5.8 % — ABNORMAL LOW (ref 14.0–48.0)
MONO#: 0.7 10*3/uL (ref 0.1–0.9)
NEUT#: 18.8 10*3/uL — ABNORMAL HIGH (ref 1.5–6.5)
NEUT%: 91 % — ABNORMAL HIGH (ref 39.6–76.8)
Platelets: 163 10*3/uL (ref 145–400)
WBC: 20.7 10*3/uL — ABNORMAL HIGH (ref 3.9–10.0)
lymph#: 1.2 10*3/uL (ref 0.9–3.3)

## 2008-03-10 LAB — COMPREHENSIVE METABOLIC PANEL
ALT: 31 U/L (ref 0–35)
BUN: 11 mg/dL (ref 6–23)
CO2: 26 mEq/L (ref 19–32)
Calcium: 9 mg/dL (ref 8.4–10.5)
Chloride: 103 mEq/L (ref 96–112)
Creatinine, Ser: 0.73 mg/dL (ref 0.40–1.20)
Glucose, Bld: 120 mg/dL — ABNORMAL HIGH (ref 70–99)

## 2008-03-19 ENCOUNTER — Encounter: Payer: Self-pay | Admitting: Internal Medicine

## 2008-03-23 ENCOUNTER — Encounter: Admission: RE | Admit: 2008-03-23 | Discharge: 2008-03-23 | Payer: Self-pay | Admitting: General Surgery

## 2008-03-24 ENCOUNTER — Encounter: Payer: Self-pay | Admitting: Internal Medicine

## 2008-03-25 ENCOUNTER — Ambulatory Visit: Payer: Self-pay | Admitting: Internal Medicine

## 2008-03-25 DIAGNOSIS — R609 Edema, unspecified: Secondary | ICD-10-CM | POA: Insufficient documentation

## 2008-03-26 ENCOUNTER — Ambulatory Visit: Payer: Self-pay | Admitting: Cardiology

## 2008-03-26 ENCOUNTER — Encounter: Payer: Self-pay | Admitting: Cardiology

## 2008-03-27 ENCOUNTER — Telehealth: Payer: Self-pay | Admitting: Internal Medicine

## 2008-03-31 ENCOUNTER — Ambulatory Visit: Payer: Self-pay | Admitting: Psychiatry

## 2008-04-04 ENCOUNTER — Ambulatory Visit: Payer: Self-pay

## 2008-04-07 ENCOUNTER — Ambulatory Visit: Payer: Self-pay | Admitting: Psychiatry

## 2008-04-10 ENCOUNTER — Ambulatory Visit: Payer: Self-pay | Admitting: Psychiatry

## 2008-04-11 ENCOUNTER — Telehealth: Payer: Self-pay | Admitting: Internal Medicine

## 2008-04-14 ENCOUNTER — Ambulatory Visit: Payer: Self-pay | Admitting: Internal Medicine

## 2008-04-15 ENCOUNTER — Telehealth: Payer: Self-pay | Admitting: Internal Medicine

## 2008-04-16 ENCOUNTER — Telehealth: Payer: Self-pay | Admitting: Internal Medicine

## 2008-04-16 ENCOUNTER — Encounter: Payer: Self-pay | Admitting: Internal Medicine

## 2008-04-17 ENCOUNTER — Ambulatory Visit: Payer: Self-pay | Admitting: Psychiatry

## 2008-04-24 ENCOUNTER — Ambulatory Visit: Payer: Self-pay | Admitting: Psychiatry

## 2008-05-05 ENCOUNTER — Ambulatory Visit: Payer: Self-pay | Admitting: Psychiatry

## 2008-05-06 ENCOUNTER — Ambulatory Visit (HOSPITAL_BASED_OUTPATIENT_CLINIC_OR_DEPARTMENT_OTHER): Admission: RE | Admit: 2008-05-06 | Discharge: 2008-05-06 | Payer: Self-pay | Admitting: General Surgery

## 2008-05-06 ENCOUNTER — Encounter: Admission: RE | Admit: 2008-05-06 | Discharge: 2008-05-06 | Payer: Self-pay | Admitting: General Surgery

## 2008-05-06 ENCOUNTER — Encounter (INDEPENDENT_AMBULATORY_CARE_PROVIDER_SITE_OTHER): Payer: Self-pay | Admitting: General Surgery

## 2008-05-09 ENCOUNTER — Ambulatory Visit: Payer: Self-pay | Admitting: Oncology

## 2008-05-13 ENCOUNTER — Encounter: Payer: Self-pay | Admitting: Internal Medicine

## 2008-05-13 LAB — CBC WITH DIFFERENTIAL/PLATELET
Eosinophils Absolute: 0.1 10*3/uL (ref 0.0–0.5)
HCT: 34.6 % — ABNORMAL LOW (ref 34.8–46.6)
HGB: 11.8 g/dL (ref 11.6–15.9)
LYMPH%: 27.5 % (ref 14.0–48.0)
MONO#: 0.3 10*3/uL (ref 0.1–0.9)
NEUT#: 2.3 10*3/uL (ref 1.5–6.5)
NEUT%: 60.7 % (ref 39.6–76.8)
Platelets: 189 10*3/uL (ref 145–400)
WBC: 3.8 10*3/uL — ABNORMAL LOW (ref 3.9–10.0)
lymph#: 1.1 10*3/uL (ref 0.9–3.3)

## 2008-05-13 LAB — COMPREHENSIVE METABOLIC PANEL
CO2: 26 mEq/L (ref 19–32)
Calcium: 9.7 mg/dL (ref 8.4–10.5)
Chloride: 104 mEq/L (ref 96–112)
Creatinine, Ser: 0.97 mg/dL (ref 0.40–1.20)
Glucose, Bld: 119 mg/dL — ABNORMAL HIGH (ref 70–99)
Sodium: 140 mEq/L (ref 135–145)
Total Bilirubin: 0.5 mg/dL (ref 0.3–1.2)
Total Protein: 6.4 g/dL (ref 6.0–8.3)

## 2008-05-14 ENCOUNTER — Ambulatory Visit: Admission: RE | Admit: 2008-05-14 | Discharge: 2008-08-12 | Payer: Self-pay | Admitting: Radiation Oncology

## 2008-05-21 ENCOUNTER — Ambulatory Visit: Payer: Self-pay | Admitting: Psychiatry

## 2008-05-22 ENCOUNTER — Ambulatory Visit: Payer: Self-pay | Admitting: Internal Medicine

## 2008-05-22 LAB — CONVERTED CEMR LAB
ALT: 15 units/L (ref 0–35)
AST: 18 units/L (ref 0–37)
Alkaline Phosphatase: 63 units/L (ref 39–117)
Basophils Absolute: 0 10*3/uL (ref 0.0–0.1)
Basophils Relative: 0.6 % (ref 0.0–3.0)
Bilirubin, Direct: 0.1 mg/dL (ref 0.0–0.3)
CO2: 31 meq/L (ref 19–32)
Chloride: 106 meq/L (ref 96–112)
Creatinine, Ser: 0.7 mg/dL (ref 0.4–1.2)
Lymphocytes Relative: 34.8 % (ref 12.0–46.0)
MCHC: 33.6 g/dL (ref 30.0–36.0)
Neutrophils Relative %: 54.5 % (ref 43.0–77.0)
Potassium: 3.7 meq/L (ref 3.5–5.1)
RBC: 3.97 M/uL (ref 3.87–5.11)
TSH: 1.23 microintl units/mL (ref 0.35–5.50)
Total Bilirubin: 0.7 mg/dL (ref 0.3–1.2)
WBC: 3.1 10*3/uL — ABNORMAL LOW (ref 4.5–10.5)

## 2008-05-28 ENCOUNTER — Ambulatory Visit: Payer: Self-pay | Admitting: Internal Medicine

## 2008-06-02 ENCOUNTER — Ambulatory Visit: Payer: Self-pay | Admitting: Psychiatry

## 2008-06-02 ENCOUNTER — Telehealth: Payer: Self-pay | Admitting: Internal Medicine

## 2008-06-09 ENCOUNTER — Ambulatory Visit: Payer: Self-pay | Admitting: Psychiatry

## 2008-06-17 LAB — CBC WITH DIFFERENTIAL/PLATELET
BASO%: 0.5 % (ref 0.0–2.0)
EOS%: 1.8 % (ref 0.0–7.0)
Eosinophils Absolute: 0.1 10*3/uL (ref 0.0–0.5)
MCV: 88 fL (ref 81.0–101.0)
MONO%: 8.2 % (ref 0.0–13.0)
NEUT#: 2.1 10*3/uL (ref 1.5–6.5)
RBC: 4.08 10*6/uL (ref 3.70–5.32)
RDW: 14.2 % (ref 11.3–14.5)

## 2008-06-30 ENCOUNTER — Ambulatory Visit: Payer: Self-pay | Admitting: Psychiatry

## 2008-07-07 ENCOUNTER — Ambulatory Visit: Payer: Self-pay | Admitting: Psychiatry

## 2008-07-08 LAB — CBC WITH DIFFERENTIAL/PLATELET
Eosinophils Absolute: 0.1 10*3/uL (ref 0.0–0.5)
MONO#: 0.3 10*3/uL (ref 0.1–0.9)
NEUT#: 2.3 10*3/uL (ref 1.5–6.5)
Platelets: 134 10*3/uL — ABNORMAL LOW (ref 145–400)
RBC: 4.16 10*6/uL (ref 3.70–5.32)
RDW: 14.7 % — ABNORMAL HIGH (ref 11.3–14.5)
WBC: 3.1 10*3/uL — ABNORMAL LOW (ref 3.9–10.0)
lymph#: 0.5 10*3/uL — ABNORMAL LOW (ref 0.9–3.3)

## 2008-07-16 ENCOUNTER — Ambulatory Visit: Payer: Self-pay | Admitting: Oncology

## 2008-07-21 ENCOUNTER — Ambulatory Visit: Payer: Self-pay | Admitting: Psychiatry

## 2008-07-24 LAB — CBC WITH DIFFERENTIAL/PLATELET
Eosinophils Absolute: 0.1 10*3/uL (ref 0.0–0.5)
HCT: 36.8 % (ref 34.8–46.6)
LYMPH%: 21.4 % (ref 14.0–48.0)
MCHC: 34.5 g/dL (ref 32.0–36.0)
MCV: 88.3 fL (ref 81.0–101.0)
MONO#: 0.2 10*3/uL (ref 0.1–0.9)
MONO%: 10.7 % (ref 0.0–13.0)
NEUT#: 1.5 10*3/uL (ref 1.5–6.5)
NEUT%: 64.5 % (ref 39.6–76.8)
Platelets: 150 10*3/uL (ref 145–400)
RBC: 4.16 10*6/uL (ref 3.70–5.32)
WBC: 2.3 10*3/uL — ABNORMAL LOW (ref 3.9–10.0)

## 2008-07-24 LAB — COMPREHENSIVE METABOLIC PANEL
Alkaline Phosphatase: 93 U/L (ref 39–117)
CO2: 28 mEq/L (ref 19–32)
Creatinine, Ser: 0.84 mg/dL (ref 0.40–1.20)
Glucose, Bld: 119 mg/dL — ABNORMAL HIGH (ref 70–99)
Total Bilirubin: 0.8 mg/dL (ref 0.3–1.2)

## 2008-08-04 ENCOUNTER — Ambulatory Visit: Payer: Self-pay | Admitting: Psychiatry

## 2008-08-09 ENCOUNTER — Telehealth (INDEPENDENT_AMBULATORY_CARE_PROVIDER_SITE_OTHER): Payer: Self-pay | Admitting: *Deleted

## 2008-08-09 ENCOUNTER — Ambulatory Visit: Payer: Self-pay | Admitting: Internal Medicine

## 2008-08-21 ENCOUNTER — Ambulatory Visit: Payer: Self-pay | Admitting: Psychiatry

## 2008-08-26 ENCOUNTER — Encounter: Payer: Self-pay | Admitting: Internal Medicine

## 2008-08-29 ENCOUNTER — Ambulatory Visit (HOSPITAL_BASED_OUTPATIENT_CLINIC_OR_DEPARTMENT_OTHER): Admission: RE | Admit: 2008-08-29 | Discharge: 2008-08-29 | Payer: Self-pay | Admitting: General Surgery

## 2008-09-08 ENCOUNTER — Ambulatory Visit (HOSPITAL_COMMUNITY): Admission: RE | Admit: 2008-09-08 | Discharge: 2008-09-08 | Payer: Self-pay | Admitting: Radiation Oncology

## 2008-09-08 ENCOUNTER — Ambulatory Visit: Payer: Self-pay | Admitting: Psychiatry

## 2008-09-17 ENCOUNTER — Ambulatory Visit: Payer: Self-pay | Admitting: Cardiology

## 2008-09-18 ENCOUNTER — Ambulatory Visit: Payer: Self-pay | Admitting: Psychiatry

## 2008-09-29 ENCOUNTER — Ambulatory Visit (HOSPITAL_COMMUNITY): Admission: RE | Admit: 2008-09-29 | Discharge: 2008-09-29 | Payer: Self-pay | Admitting: Radiation Oncology

## 2008-10-09 ENCOUNTER — Encounter: Payer: Self-pay | Admitting: Internal Medicine

## 2008-10-10 ENCOUNTER — Encounter: Payer: Self-pay | Admitting: Internal Medicine

## 2008-10-15 ENCOUNTER — Encounter: Admission: RE | Admit: 2008-10-15 | Discharge: 2008-10-15 | Payer: Self-pay | Admitting: General Surgery

## 2008-10-23 ENCOUNTER — Encounter: Admission: RE | Admit: 2008-10-23 | Discharge: 2008-12-08 | Payer: Self-pay | Admitting: Neurosurgery

## 2008-10-29 ENCOUNTER — Telehealth: Payer: Self-pay | Admitting: Internal Medicine

## 2008-10-30 ENCOUNTER — Ambulatory Visit: Payer: Self-pay | Admitting: Psychiatry

## 2008-11-06 ENCOUNTER — Ambulatory Visit: Payer: Self-pay | Admitting: Psychiatry

## 2008-11-13 ENCOUNTER — Ambulatory Visit: Payer: Self-pay | Admitting: Psychiatry

## 2008-11-19 ENCOUNTER — Ambulatory Visit: Payer: Self-pay | Admitting: Oncology

## 2008-11-21 LAB — CBC WITH DIFFERENTIAL/PLATELET
BASO%: 0.3 % (ref 0.0–2.0)
EOS%: 0.9 % (ref 0.0–7.0)
Eosinophils Absolute: 0 10*3/uL (ref 0.0–0.5)
LYMPH%: 20.6 % (ref 14.0–49.7)
MCHC: 34.7 g/dL (ref 31.5–36.0)
MCV: 91.9 fL (ref 79.5–101.0)
MONO%: 7 % (ref 0.0–14.0)
NEUT#: 2.8 10*3/uL (ref 1.5–6.5)
RBC: 4.07 10*6/uL (ref 3.70–5.45)
RDW: 14 % (ref 11.2–14.5)

## 2008-11-21 LAB — COMPREHENSIVE METABOLIC PANEL
ALT: 20 U/L (ref 0–35)
AST: 20 U/L (ref 0–37)
CO2: 34 mEq/L — ABNORMAL HIGH (ref 19–32)
Calcium: 10.2 mg/dL (ref 8.4–10.5)
Chloride: 101 mEq/L (ref 96–112)
Creatinine, Ser: 0.74 mg/dL (ref 0.40–1.20)
Potassium: 3.3 mEq/L — ABNORMAL LOW (ref 3.5–5.3)
Sodium: 142 mEq/L (ref 135–145)
Total Protein: 7 g/dL (ref 6.0–8.3)

## 2008-11-21 LAB — LACTATE DEHYDROGENASE: LDH: 124 U/L (ref 94–250)

## 2008-12-22 ENCOUNTER — Ambulatory Visit: Payer: Self-pay | Admitting: Psychiatry

## 2008-12-31 ENCOUNTER — Ambulatory Visit: Payer: Self-pay | Admitting: Internal Medicine

## 2008-12-31 ENCOUNTER — Encounter: Payer: Self-pay | Admitting: Physician Assistant

## 2009-01-11 ENCOUNTER — Emergency Department (HOSPITAL_COMMUNITY): Admission: EM | Admit: 2009-01-11 | Discharge: 2009-01-11 | Payer: Self-pay | Admitting: Emergency Medicine

## 2009-01-20 ENCOUNTER — Telehealth (INDEPENDENT_AMBULATORY_CARE_PROVIDER_SITE_OTHER): Payer: Self-pay | Admitting: *Deleted

## 2009-01-21 ENCOUNTER — Encounter: Payer: Self-pay | Admitting: Cardiology

## 2009-01-21 ENCOUNTER — Ambulatory Visit: Payer: Self-pay

## 2009-02-04 ENCOUNTER — Ambulatory Visit: Payer: Self-pay | Admitting: Psychiatry

## 2009-02-23 ENCOUNTER — Encounter: Admission: RE | Admit: 2009-02-23 | Discharge: 2009-05-24 | Payer: Self-pay | Admitting: Oncology

## 2009-02-26 ENCOUNTER — Ambulatory Visit (HOSPITAL_COMMUNITY): Admission: RE | Admit: 2009-02-26 | Discharge: 2009-02-26 | Payer: Self-pay | Admitting: Oncology

## 2009-02-26 ENCOUNTER — Ambulatory Visit: Payer: Self-pay | Admitting: Oncology

## 2009-02-26 LAB — BUN: BUN: 15 mg/dL (ref 6–23)

## 2009-03-02 ENCOUNTER — Inpatient Hospital Stay (HOSPITAL_COMMUNITY): Admission: EM | Admit: 2009-03-02 | Discharge: 2009-03-06 | Payer: Self-pay | Admitting: Emergency Medicine

## 2009-03-03 ENCOUNTER — Ambulatory Visit: Payer: Self-pay | Admitting: Oncology

## 2009-03-05 ENCOUNTER — Encounter (INDEPENDENT_AMBULATORY_CARE_PROVIDER_SITE_OTHER): Payer: Self-pay | Admitting: Diagnostic Radiology

## 2009-03-09 ENCOUNTER — Ambulatory Visit: Payer: Self-pay | Admitting: Cardiology

## 2009-03-09 ENCOUNTER — Ambulatory Visit: Payer: Self-pay | Admitting: Psychiatry

## 2009-03-25 LAB — CREATININE, SERUM: Creatinine, Ser: 0.9 mg/dL (ref 0.40–1.20)

## 2009-03-25 LAB — BUN: BUN: 10 mg/dL (ref 6–23)

## 2009-03-30 ENCOUNTER — Ambulatory Visit: Payer: Self-pay | Admitting: Psychiatry

## 2009-03-30 ENCOUNTER — Ambulatory Visit: Payer: Self-pay | Admitting: Oncology

## 2009-04-28 ENCOUNTER — Encounter: Payer: Self-pay | Admitting: Internal Medicine

## 2009-05-11 ENCOUNTER — Encounter: Admission: RE | Admit: 2009-05-11 | Discharge: 2009-06-02 | Payer: Self-pay | Admitting: Oncology

## 2009-05-28 ENCOUNTER — Encounter: Admission: RE | Admit: 2009-05-28 | Discharge: 2009-06-09 | Payer: Self-pay | Admitting: Oncology

## 2009-09-23 ENCOUNTER — Ambulatory Visit: Payer: Self-pay | Admitting: Oncology

## 2009-09-25 LAB — COMPREHENSIVE METABOLIC PANEL
ALT: 14 U/L (ref 0–35)
BUN: 18 mg/dL (ref 6–23)
CO2: 25 mEq/L (ref 19–32)
Calcium: 9.3 mg/dL (ref 8.4–10.5)
Chloride: 103 mEq/L (ref 96–112)
Creatinine, Ser: 0.91 mg/dL (ref 0.40–1.20)
Total Bilirubin: 0.7 mg/dL (ref 0.3–1.2)

## 2009-09-25 LAB — CBC WITH DIFFERENTIAL/PLATELET
BASO%: 0.2 % (ref 0.0–2.0)
Basophils Absolute: 0 10*3/uL (ref 0.0–0.1)
EOS%: 1.2 % (ref 0.0–7.0)
HCT: 37.6 % (ref 34.8–46.6)
HGB: 12.2 g/dL (ref 11.6–15.9)
LYMPH%: 32.9 % (ref 14.0–49.7)
MCH: 30.6 pg (ref 25.1–34.0)
MCHC: 32.4 g/dL (ref 31.5–36.0)
MONO#: 0.4 10*3/uL (ref 0.1–0.9)
NEUT%: 57.4 % (ref 38.4–76.8)
Platelets: 179 10*3/uL (ref 145–400)

## 2009-09-25 LAB — VITAMIN D 25 HYDROXY (VIT D DEFICIENCY, FRACTURES): Vit D, 25-Hydroxy: 35 ng/mL (ref 30–89)

## 2009-09-25 LAB — LACTATE DEHYDROGENASE: LDH: 179 U/L (ref 94–250)

## 2009-09-30 ENCOUNTER — Ambulatory Visit: Payer: Self-pay | Admitting: Psychiatry

## 2009-10-02 ENCOUNTER — Encounter: Payer: Self-pay | Admitting: Internal Medicine

## 2009-10-16 ENCOUNTER — Encounter: Admission: RE | Admit: 2009-10-16 | Discharge: 2009-10-16 | Payer: Self-pay | Admitting: Oncology

## 2009-10-27 ENCOUNTER — Ambulatory Visit: Payer: Self-pay | Admitting: Psychiatry

## 2009-10-29 ENCOUNTER — Encounter: Payer: Self-pay | Admitting: Internal Medicine

## 2009-11-10 ENCOUNTER — Ambulatory Visit: Payer: Self-pay | Admitting: Psychiatry

## 2009-12-01 ENCOUNTER — Ambulatory Visit: Payer: Self-pay | Admitting: Psychiatry

## 2009-12-15 ENCOUNTER — Ambulatory Visit: Payer: Self-pay | Admitting: Psychiatry

## 2009-12-17 ENCOUNTER — Ambulatory Visit (HOSPITAL_COMMUNITY): Admission: RE | Admit: 2009-12-17 | Discharge: 2009-12-17 | Payer: Self-pay | Admitting: Radiation Oncology

## 2009-12-24 ENCOUNTER — Ambulatory Visit: Payer: Self-pay | Admitting: Psychiatry

## 2010-01-07 ENCOUNTER — Ambulatory Visit: Payer: Self-pay | Admitting: Psychiatry

## 2010-01-21 ENCOUNTER — Ambulatory Visit: Payer: Self-pay | Admitting: Psychiatry

## 2010-01-28 ENCOUNTER — Ambulatory Visit: Payer: Self-pay | Admitting: Cardiology

## 2010-01-28 DIAGNOSIS — R079 Chest pain, unspecified: Secondary | ICD-10-CM | POA: Insufficient documentation

## 2010-02-02 ENCOUNTER — Ambulatory Visit: Payer: Self-pay | Admitting: Psychiatry

## 2010-02-18 ENCOUNTER — Ambulatory Visit: Payer: Self-pay | Admitting: Psychiatry

## 2010-03-12 ENCOUNTER — Ambulatory Visit: Payer: Self-pay | Admitting: Oncology

## 2010-03-17 LAB — LACTATE DEHYDROGENASE: LDH: 158 U/L (ref 94–250)

## 2010-03-17 LAB — COMPREHENSIVE METABOLIC PANEL
ALT: 21 U/L (ref 0–35)
Albumin: 4.3 g/dL (ref 3.5–5.2)
CO2: 25 mEq/L (ref 19–32)
Calcium: 10.1 mg/dL (ref 8.4–10.5)
Chloride: 101 mEq/L (ref 96–112)
Glucose, Bld: 139 mg/dL — ABNORMAL HIGH (ref 70–99)
Potassium: 3.2 mEq/L — ABNORMAL LOW (ref 3.5–5.3)
Sodium: 140 mEq/L (ref 135–145)
Total Bilirubin: 0.7 mg/dL (ref 0.3–1.2)
Total Protein: 6.7 g/dL (ref 6.0–8.3)

## 2010-03-17 LAB — CBC WITH DIFFERENTIAL/PLATELET
BASO%: 0.9 % (ref 0.0–2.0)
Eosinophils Absolute: 0.1 10*3/uL (ref 0.0–0.5)
LYMPH%: 31.5 % (ref 14.0–49.7)
MCHC: 35 g/dL (ref 31.5–36.0)
MONO#: 0.3 10*3/uL (ref 0.1–0.9)
NEUT#: 1.8 10*3/uL (ref 1.5–6.5)
Platelets: 171 10*3/uL (ref 145–400)
RBC: 3.92 10*6/uL (ref 3.70–5.45)
RDW: 13.7 % (ref 11.2–14.5)
WBC: 3.1 10*3/uL — ABNORMAL LOW (ref 3.9–10.3)
lymph#: 1 10*3/uL (ref 0.9–3.3)

## 2010-03-17 LAB — CANCER ANTIGEN 27.29: CA 27.29: 33 U/mL (ref 0–39)

## 2010-03-18 ENCOUNTER — Ambulatory Visit: Payer: Self-pay | Admitting: Psychiatry

## 2010-03-22 ENCOUNTER — Encounter: Payer: Self-pay | Admitting: Cardiology

## 2010-03-23 ENCOUNTER — Ambulatory Visit: Payer: Self-pay | Admitting: Cardiology

## 2010-04-05 ENCOUNTER — Ambulatory Visit: Payer: Self-pay | Admitting: Cardiology

## 2010-04-08 ENCOUNTER — Telehealth: Payer: Self-pay | Admitting: Cardiology

## 2010-04-08 LAB — CONVERTED CEMR LAB
AST: 21 units/L (ref 0–37)
Alkaline Phosphatase: 59 units/L (ref 39–117)
Bilirubin, Direct: 0.1 mg/dL (ref 0.0–0.3)
Calcium: 9.9 mg/dL (ref 8.4–10.5)
GFR calc non Af Amer: 65.25 mL/min (ref >60)
LDL Cholesterol: 89 mg/dL (ref 0–99)
Potassium: 4.7 meq/L (ref 3.5–5.1)
Sodium: 140 meq/L (ref 135–145)
Total Bilirubin: 1.1 mg/dL (ref 0.3–1.2)
Total CHOL/HDL Ratio: 3
VLDL: 19 mg/dL (ref 0.0–40.0)

## 2010-04-20 ENCOUNTER — Ambulatory Visit: Payer: Self-pay | Admitting: Psychiatry

## 2010-05-04 ENCOUNTER — Ambulatory Visit: Payer: Self-pay | Admitting: Psychiatry

## 2010-05-27 ENCOUNTER — Ambulatory Visit: Payer: Self-pay | Admitting: Psychiatry

## 2010-06-24 ENCOUNTER — Ambulatory Visit: Payer: Self-pay | Admitting: Psychiatry

## 2010-07-07 ENCOUNTER — Ambulatory Visit: Payer: Self-pay | Admitting: Psychiatry

## 2010-07-16 ENCOUNTER — Ambulatory Visit: Payer: Self-pay | Admitting: Cardiology

## 2010-07-16 ENCOUNTER — Telehealth: Payer: Self-pay | Admitting: Cardiology

## 2010-07-16 ENCOUNTER — Inpatient Hospital Stay (HOSPITAL_COMMUNITY)
Admission: EM | Admit: 2010-07-16 | Discharge: 2010-07-17 | Payer: Self-pay | Source: Home / Self Care | Admitting: Emergency Medicine

## 2010-07-16 ENCOUNTER — Encounter: Payer: Self-pay | Admitting: Internal Medicine

## 2010-07-17 ENCOUNTER — Encounter: Payer: Self-pay | Admitting: Cardiology

## 2010-07-19 ENCOUNTER — Telehealth: Payer: Self-pay | Admitting: Internal Medicine

## 2010-07-29 ENCOUNTER — Ambulatory Visit: Payer: Self-pay | Admitting: Psychiatry

## 2010-08-02 ENCOUNTER — Encounter: Payer: Self-pay | Admitting: Cardiology

## 2010-08-02 ENCOUNTER — Ambulatory Visit: Payer: Self-pay | Admitting: Cardiology

## 2010-08-10 ENCOUNTER — Telehealth (INDEPENDENT_AMBULATORY_CARE_PROVIDER_SITE_OTHER): Payer: Self-pay

## 2010-08-11 ENCOUNTER — Encounter: Payer: Self-pay | Admitting: Cardiology

## 2010-08-11 ENCOUNTER — Encounter (HOSPITAL_COMMUNITY)
Admission: RE | Admit: 2010-08-11 | Discharge: 2010-10-12 | Payer: Self-pay | Source: Home / Self Care | Attending: Cardiology | Admitting: Cardiology

## 2010-08-11 ENCOUNTER — Ambulatory Visit: Payer: Self-pay

## 2010-08-11 ENCOUNTER — Ambulatory Visit: Payer: Self-pay | Admitting: Cardiology

## 2010-08-12 ENCOUNTER — Telehealth: Payer: Self-pay | Admitting: Cardiology

## 2010-08-18 ENCOUNTER — Ambulatory Visit: Payer: Self-pay | Admitting: Internal Medicine

## 2010-08-19 ENCOUNTER — Ambulatory Visit: Payer: Self-pay | Admitting: Psychiatry

## 2010-09-14 ENCOUNTER — Telehealth (INDEPENDENT_AMBULATORY_CARE_PROVIDER_SITE_OTHER): Payer: Self-pay | Admitting: *Deleted

## 2010-09-15 ENCOUNTER — Ambulatory Visit
Admission: RE | Admit: 2010-09-15 | Discharge: 2010-09-15 | Payer: Self-pay | Source: Home / Self Care | Attending: Cardiology | Admitting: Cardiology

## 2010-09-15 ENCOUNTER — Ambulatory Visit
Admission: RE | Admit: 2010-09-15 | Discharge: 2010-09-15 | Payer: Self-pay | Source: Home / Self Care | Attending: Psychiatry | Admitting: Psychiatry

## 2010-09-21 ENCOUNTER — Inpatient Hospital Stay (HOSPITAL_COMMUNITY)
Admission: RE | Admit: 2010-09-21 | Discharge: 2010-09-22 | Payer: Self-pay | Source: Home / Self Care | Attending: Obstetrics and Gynecology | Admitting: Obstetrics and Gynecology

## 2010-09-27 LAB — CBC
HCT: 34.1 % — ABNORMAL LOW (ref 36.0–46.0)
Hemoglobin: 11.1 g/dL — ABNORMAL LOW (ref 12.0–15.0)
MCH: 29.8 pg (ref 26.0–34.0)
MCHC: 32.6 g/dL (ref 30.0–36.0)
MCV: 91.7 fL (ref 78.0–100.0)
Platelets: 156 10*3/uL (ref 150–400)
RBC: 3.72 MIL/uL — ABNORMAL LOW (ref 3.87–5.11)
RDW: 13.2 % (ref 11.5–15.5)
WBC: 10.4 10*3/uL (ref 4.0–10.5)

## 2010-10-01 ENCOUNTER — Ambulatory Visit
Admission: RE | Admit: 2010-10-01 | Discharge: 2010-10-01 | Payer: Self-pay | Source: Home / Self Care | Attending: Internal Medicine | Admitting: Internal Medicine

## 2010-10-01 DIAGNOSIS — R0989 Other specified symptoms and signs involving the circulatory and respiratory systems: Secondary | ICD-10-CM | POA: Insufficient documentation

## 2010-10-01 DIAGNOSIS — R0609 Other forms of dyspnea: Secondary | ICD-10-CM | POA: Insufficient documentation

## 2010-10-02 ENCOUNTER — Other Ambulatory Visit: Payer: Self-pay | Admitting: Oncology

## 2010-10-02 DIAGNOSIS — Z9889 Other specified postprocedural states: Secondary | ICD-10-CM

## 2010-10-03 ENCOUNTER — Encounter: Payer: Self-pay | Admitting: Internal Medicine

## 2010-10-03 ENCOUNTER — Encounter: Payer: Self-pay | Admitting: Radiation Oncology

## 2010-10-03 ENCOUNTER — Encounter: Payer: Self-pay | Admitting: Oncology

## 2010-10-03 ENCOUNTER — Encounter: Payer: Self-pay | Admitting: General Surgery

## 2010-10-04 ENCOUNTER — Encounter: Payer: Self-pay | Admitting: Radiation Oncology

## 2010-10-06 ENCOUNTER — Ambulatory Visit: Admit: 2010-10-06 | Payer: Self-pay | Admitting: Psychiatry

## 2010-10-12 NOTE — Letter (Signed)
[facility]   Imported By: Marylou Mccoy 05/07/2010 10:50:00  _____________________________________________________________________  External Attachment:    Type:   Image     Comment:   External Document

## 2010-10-12 NOTE — Assessment & Plan Note (Signed)
Summary: eph/ gd  Medications Added NITROSTAT 0.4 MG SUBL (NITROGLYCERIN) 1 tablet under tongue at onset of chest pain; you may repeat every 5 minutes for up to 3 doses. PROVENTIL HFA 108 (90 BASE) MCG/ACT AERS (ALBUTEROL SULFATE) 2 puffs q 4 hour as needed FLUTICASONE PROPIONATE 50 MCG/ACT SUSP (FLUTICASONE PROPIONATE) use as directed CETIRIZINE HCL 10 MG TABS (CETIRIZINE HCL) 1 tab once daily DULERA 100-5 MCG/ACT AERO (MOMETASONE FURO-FORMOTEROL FUM) use as directed        Visit Type:  EPH Primary Provider:  Dr. Secundino Ginger  CC:  chest discomfort last Thursday....sob....edema/ankles .  History of Present Illness: [name] consent the day for followup of her chest discomfort and history of nonobstructive coronary disease.  She was hospitalized for prolonged chest discomfort, shortness of breath, and wheezing. She was also suffering from a chronic cough.  She ruled out for myocardial infarction. She was seen in consultation by Dr. Sherene Sires who changed her ACE inhibitor to Diovan. Her cough is markedly improved. In addition they changed her Inderal to bisoprolol. Her fatigue is improved. Note she has multiple drug allergies.  She has had no further chest discomfort. She does have dyspnea on exertion. She has a history of good LV function.  It is recommended discharge to pursue followup with a stress Myoview.  Clinical Reports Reviewed:  Cardiac Cath:  04/06/2004: Cardiac Cath Findings:   CONCLUSIONS:  1. Normal left ventricular and E and D diastolic pressure.  2. Mild coronary artery disease as described, which is not hemodynamically     significant.  It does not appear to be changed significantly when     compared to a previous catheterization.   PLAN:  Continued medical therapy.                                             Carole Binning, M.D. [facility] Cardiac Cath Findings:   RESULTS/HEMODYNAMICS:  Left ventricular pressure 126/14, aortic pressure  138/82.  There was no aortic  valve gradient.   Left ventriculography was not performed.   Coronary arteriography (codominant).   Left main is normal.   Left anterior descending artery has a tubular 40% stenosis in the mid  vessel.  The distal vessel has diffuse luminal irregularities.  The LAD  gives rise to a large first diagonal branch and a normal size second  diagonal branch.   Left circumflex is a codominant vessel.  There is a 25% stenosis in the  ostium and proximal circumflex.  Circumflex gives rise to a large branching  obtuse marginal and normal size first posterolateral branch, small second  posterolateral branch and a normal size third posterolateral branch.   Right coronary artery gives rise to a normal size posterior descending  artery and two small posterolateral branches.  The right coronary artery is  normal.   Intravascular ultrasound of the left circumflex revealed mild  atherosclerotic disease of the proximal and ostial left circumflex which was  nonobstructive in nature.  There was otherwise no significant disease  identified.   CONCLUSIONS:  1. Normal left ventricular and E and D diastolic pressure.  2. Mild coronary artery disease as described, which is not hemodynamically     significant.  It does not appear to be changed significantly when     compared to a previous catheterization.   PLAN:  Continued medical therapy.  Carole Binning, M.D. [facility]  03/14/2002: Cardiac Cath Findings:  FINDINGS: 1. Left main trunk: A large caliber vessel with mild irregularities. 2. LAD.  This begins as a large caliber vessel and tapers distally.  It    provides three diagonal branches.  There is evidence of eccentric    calcification in the mid LAD with moderate disease of 50% by angiography.    There is mild haziness in the mid LAD as well.  The distal LAD has luminal    irregularities.  The first two diagonal branches are major vessels and have     mild irregularities.  The third diagonal branch is a triple vessel. 3. Left circumflex artery: This is a medium caliber vessel that provides three    marginal branches at the mid section and a small PDA distally.  It is    codominant.  There is mild disease of 10 to 20% by angiogram. 4. Right coronary artery is codominant.  This is a medium caliber vessel that    provides a small posterior descending artery in its terminal segment.    The right coronary artery has luminal irregularities.  LEFT VENTRICULOGRAPHY:  Normal end-systolic and end-diastolic dimensions. Overall left ventricular function is well preserved, ejection fraction greater than 65%.  No mitral regurgitation.  LV pressure 140/10, aortic 140/85.  LV EDP equals 20.  INTRAVASCULAR ULTRASOUND:  The left circumflex artery shows mild eccentric plaque in the proximal segment.  Intravascular ultrasound of the LAD shows this to be a medium caliber vessel distally of 3 mm, a large caliber vessel of 3.5 mm proximally.  There is heavy concentric plaque as well as calcification in the mid section with a lumen of approximately 2.2 to 2.5 mm in diameter.  ASSESSMENT/PLAN:  Linda. Su [name] is a 60 year old female with moderate single-vessel coronary artery disease.  This does not appear to be critical at this point.  Continued medical therapy will be pursued.  Should the patient have recurrent symptoms, dobutamine echocardiogram may be helpful in determining of she has ischemia in the anterior wall. Dictated by:   Veneda Melter, M.D. Cardiac Cath Findings:  INTRAVASCULAR ULTRASOUND:  The left circumflex artery shows mild eccentric plaque in the proximal segment.  Intravascular ultrasound of the LAD shows this to be a medium caliber vessel distally of 3 mm, a large caliber vessel of 3.5 mm proximally.  There is heavy concentric plaque as well as calcification in the mid section with a lumen of approximately 2.2 to 2.5 mm in  diameter.  ASSESSMENT/PLAN:  Linda. Su [name] is a 60 year old female with moderate single-vessel coronary artery disease.  This does not appear to be critical at this point.  Continued medical therapy will be pursued.  Should the patient have recurrent symptoms, dobutamine echocardiogram may be helpful in determining of she has ischemia in the anterior wall. Dictated by:   Veneda Melter, M.D. Attending Physician:  Veneda Melter DD:  03/14/02 TD:  03/18/02 Job: [id number]  Nuclear Study:  10/11/2007:    exercise capacity:Adenosine study with no exercise blood pressure response: Normal Bp Response clinical symptoms: No CP or Dyspnea ECG impression:No significant ST segment change suggestive of ischemia overall impression: Normal stress nuclear study Dr.Peter Nishan  10/11/2007:  Excerise capacity: Adenosine study with no exercise  Blood Pressure response: Normal blood pressure repsonse  Clinical symptoms: No chest pain or dyspnea  ECG impression: No significant ST segment change suggestive of ischemia  Overall impression: Normal stress nuclear study.  Wendall Stade,  MD   07/01/2002:  Final Interpretation: Stress Cardiolite with no chest pain and no diagnostic electrocardiogram changes. The scintigraphic results show no evidence of ischemia or infarction in any vascular territory. Gated ejection fraction was 76%.  Olga Millers, MD, [facility]  07/01/2002:  Final Intrepretation: Stress Cardiolite with no chest pain and no diagnostic electrocardiogram changes. The scintigraphic results show no evidence of ischemia or infarction any vacular territory. Gated ejection fraction was 76%.  Olga Millers ,MD   Current Medications (verified): 1)  Furosemide 20 Mg Tabs (Furosemide) .... Take 1 Tablet A Day. 2)  Bisoprolol Fumarate 5 Mg Tabs (Bisoprolol Fumarate) .Marland Kitchen.. 1 Tab Two Times A Day 3)  Diovan 160 Mg Tabs (Valsartan) .Marland Kitchen.. 1 Cap Once Daily 4)  Pravastatin Sodium 20 Mg Tabs  (Pravastatin Sodium) .Marland Kitchen.. 1 Tab Once Daily 5)  Xanax 1 Mg Tabs (Alprazolam) .... 1/2 - 1 By Mouth Three Times A Day Prn 6)  Vitamin D3 1000 Unit  Tabs (Cholecalciferol) .Marland Kitchen.. 1 Tab Once Daily 7)  Vitamin C 500 Mg Tabs (Ascorbic Acid) .Marland Kitchen.. 1 Tab Once Daily 8)  Nitrostat 0.4 Mg Subl (Nitroglycerin) .Marland Kitchen.. 1 Tablet Under Tongue At Onset of Chest Pain; You May Repeat Every 5 Minutes For Up To 3 Doses. 9)  Proventil Hfa 108 (90 Base) Mcg/act Aers (Albuterol Sulfate) .... 2 Puffs 4 Times Daily As Needed 10)  Calcium-Carb 600 + D 600-125 Mg-Unit Tabs (Calcium Carbonate-Vitamin D) .Marland Kitchen.. 1 Tab Once Daily 11)  Tramadol Hcl 50 Mg Tabs (Tramadol Hcl) .Marland Kitchen.. 1 Tab Four Times Daily As Needed 12)  Xopenex Hfa 45 Mcg/act Aero (Levalbuterol Tartrate) .... 2 Puffs Q 6 Hour As Needed 13)  Aspirin 81 Mg Tbec (Aspirin) .... Take One Tablet By Mouth Daily 14)  Restasis 0.05 % Emul (Cyclosporine) .Marland Kitchen.. 1 Gtt Ou Two Times A Day 15)  Proventil Hfa 108 (90 Base) Mcg/act Aers (Albuterol Sulfate) .... 2 Puffs Q 4 Hour As Needed 16)  Fluticasone Propionate 50 Mcg/act Susp (Fluticasone Propionate) .... Use As Directed 17)  Cetirizine Hcl 10 Mg Tabs (Cetirizine Hcl) .Marland Kitchen.. 1 Tab Once Daily 18)  Dulera 100-5 Mcg/act Aero (Mometasone Furo-Formoterol Fum) .... Use As Directed  Allergies: 1)  ! Penicillin V Potassium (Penicillin V Potassium) 2)  ! Covera-Hs (Verapamil Hcl) 3)  ! Zoloft (Sertraline Hcl) 4)  ! Librax (Clidinium-Chlordiazepoxide) 5)  ! Paxil (Paroxetine Hcl) 6)  ! Sulfadiazine (Sulfadiazine) 7)  ! Cephalexin (Cephalexin) 8)  ! Amoxicillin (Amoxicillin) 9)  ! Bromfenex 10)  ! * Tequin 11)  ! Latex 12)  ! Darvocet 13)  ! 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 To have port-a-cath placed for chemo. Has not started chemo or radiation treatment. 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 Dr Derrell Lolling Onc Dr Donnie Coffin  Past Surgical  History: Last updated: 12/30/2008 Hysterectomy 1987 heart cath-insign cad 2/01 mri/mra brain endonoscopy 2001 asteroid trial crestor 40 mg once daily 03-28-02 porta-cath 2009  Dr Derrell Lolling cardiac stent X2 in 2001 Cholecystectomy remotely  Family History: Last updated: 02/27/2009 Family History of Anxiety Family History Hypertension  Father died of pancreatic cancer.  Mother still alive Siblings: 1 sister..2 brothers alive and well..1 brother deceased   Social History: Last updated: 07/16/2010 Former Smoker  stopped for around 1985 Alcohol use-no Regular exercise-no Getting divorced, moved to a boyfreind 2008   Mollee currently resides with her significant other, [employer].  She has four grown children and several grandchildren.   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  She does have a history of smoking, quit 25 years ago,   and smoked for 6-7 years.  No history of alcohol or illicit drug abuse.      Risk Factors: Exercise: no (03/29/2007)  Risk  Factors: Smoking Status: quit (03/29/2007)  Review of Systems       negative other than history of present illness  Vital Signs:  Patient profile:   60 year old female Height:      64 inches Weight:      171 pounds BMI:     29.46 Pulse rate:   64 / minute Pulse rhythm:   irregular BP sitting:   122 / 80  (left arm) Cuff size:   large  Vitals Entered By: Danielle Rankin, CMA (August 02, 2010 10:23 AM)  Physical Exam  General:  obese.  very pleasant, no acute distress Head:  normocephalic and atraumatic Eyes:  glasses otherwise normal Neck:  Neck supple, no JVD. No masses, thyromegaly or abnormal cervical nodes. Chest Wall:  no deformities or breast masses noted Lungs:  Clear bilaterally to auscultation and percussion. Heart:  PMI heart appreciate, soft S1-S2, no gallop murmur or rub. No obvious carotid bruits Msk:  Back normal, normal gait. Muscle strength and tone normal. Pulses:  pulses normal in all 4 extremities Extremities:  No clubbing or cyanosis. Neurologic:  Alert and oriented x 3. Skin:  Intact without lesions or rashes. Psych:  Normal affect.   Impression & Recommendations:  Problem # 1:  CORONARY ARTERY DISEASE (ICD-414.00) with chest pain, and no objective assessment of her coronary disease for 2 years, would proceed with stress nuclear study. Her updated medication list for this problem includes:    Bisoprolol Fumarate 5 Mg Tabs (Bisoprolol fumarate) .Marland Kitchen... 1 tab two times a day    Nitrostat 0.4 Mg Subl (Nitroglycerin) .Marland Kitchen... 1 tablet under tongue at onset of chest pain; you may repeat every 5 minutes for up to 3 doses.    Aspirin 81 Mg Tbec (Aspirin) .Marland Kitchen... Take one tablet by mouth daily  Orders: EKG w/ Interpretation (93000) Nuclear Stress Test (Nuc Stress Test)  Problem # 2:  OLD MYOCARDIAL INFARCTION (ICD-412) Assessment: Unchanged  Her updated medication list for this problem includes:    Bisoprolol Fumarate 5 Mg Tabs (Bisoprolol fumarate) .Marland Kitchen... 1  tab two times a day    Nitrostat 0.4 Mg Subl (Nitroglycerin) .Marland Kitchen... 1 tablet under tongue at onset of chest pain; you may repeat every 5 minutes for up to 3 doses.    Aspirin 81 Mg Tbec (Aspirin) .Marland Kitchen... Take one tablet by mouth daily  Problem # 3:  HYPERTENSION (ICD-401.9) Assessment: Improved  Her updated medication list for this problem includes:    Furosemide 20 Mg  Tabs (Furosemide) .Marland Kitchen... Take 1 tablet a day.    Bisoprolol Fumarate 5 Mg Tabs (Bisoprolol fumarate) .Marland Kitchen... 1 tab two times a day    Diovan 160 Mg Tabs (Valsartan) .Marland Kitchen... 1 cap once daily    Aspirin 81 Mg Tbec (Aspirin) .Marland Kitchen... Take one tablet by mouth daily  Problem # 4:  EDEMA (ICD-782.3) Assessment: Improved  Patient Instructions: 1)  Your physician recommends that you schedule a follow-up appointment in: 6 months with Dr. Daleen Squibb 2)  Your physician recommends that you continue on your current medications as directed. Please refer to the Current Medication list given to you today. 3)  Your physician has requested that you have an [employer]  For further information please visit [email or web address]  Please follow instruction sheet, as given.

## 2010-10-12 NOTE — Assessment & Plan Note (Signed)
Summary: Inpatient consultation > try off inderal, advair and ACE   Primary Provider/Referring Provider:  Dr. Secundino Ginger   History of Present Illness: 83 yowm quit smoking around 1985 with no respirartory problems at all.   July 16, 2010  1st pulmonary eval in EMR era for > 2 years sob and refractory cough and wheeze since 05/2010, never previously had any fall allegies.  See inpt eval > d/c ACE and and Inderol  Allergies: 1)  ! Penicillin V Potassium (Penicillin V Potassium) 2)  ! Covera-Hs (Verapamil Hcl) 3)  ! Zoloft (Sertraline Hcl) 4)  ! Librax (Clidinium-Chlordiazepoxide) 5)  ! Paxil (Paroxetine Hcl) 6)  ! Sulfadiazine (Sulfadiazine) 7)  ! Cephalexin (Cephalexin) 8)  ! Amoxicillin (Amoxicillin) 9)  ! Bromfenex 10)  ! * Tequin 11)  ! Latex 12)  ! Darvocet 13)  ! 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 To have port-a-cath placed for chemo. Has not started chemo or radiation treatment. 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 Dr Derrell Lolling Onc Dr  Donnie Coffin  Social History: Former Smoker  stopped for around 1985 Alcohol use-no Regular exercise-no Getting divorced, moved to a boyfreind 2008   Ellis currently resides with her significant other, [employer].  She has four grown children and several grandchildren.   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  She does have a history of smoking, quit 25 years ago,   and smoked for 6-7 years.  No history of alcohol or illicit drug abuse.      Physical Exam  Additional Exam:  obese wf with unusual affect who  failed to answer a single question asked in a straightforward manner, tending to go off on tangents or answer questions with ambiguous medical terms or diagnoses and seemed perplexed when asked the same question more than once for clarification. Classic  pseudowheeze resolves with purse lip maneuver HEENT: nl dentition, turbinates, and orophanx. Nl external ear canals without cough reflex NECK :  without JVD/Nodes/TM/ nl carotid upstrokes bilaterally LUNGS: no acc muscle use, clear to A and P bilaterally without cough on insp or exp maneuvers CV:  RRR  no s3 or murmur or increase in P2, no edema  ABD:  soft and nontender with nl excursion in the supine position. No bruits or organomegaly, bowel sounds nl MS:  warm without deformities, calf tenderness, cyanosis or clubbing SKIN: warm and dry without lesions   NEURO:  alert, approp, no deficits     Impression & Recommendations:  Problem # 1:  ASTHMA (ICD-493.90) When respiratory symptoms begin well after a patient reports complete smoking cessation,  it is very hard to "blame"  primary airwas dz  ie it doesn't make any more sense than hearing a  [employer]  driver wrecked his car while driving his kids to school or a Careers adviser sliced his hand off carving [place].  Once the high risk activity stops,  the symptoms should not suddenly erupt.  If so, the differential diagnosis should include   obesity/deconditioning,  LPR/Reflux, CHF, or side effect of medications  (Ace inhibitors and inderol in this case)  Rec off ace and inderol  Problem # 2:  HYPERTENSION (ICD-401.9)  ACE inhibitors are problematic in  pts with airway complaints because  even experienced pulmonologists can't always distinguish ace effects from copd/asthma.  By themselves they don't actually cause a problem, much like oxygen can't by itself start a fire, but they certainly serve as a powerful catalyst or enhancer for any "fire"  or inflammatory process in the upper airway, be it caused by an ET  tube or more commonly reflux (especially in the obese or pts with known GERD or who are on biphoshonates).   In the era of ARB near equivalency until we have a better handle on the reversibility of the airway problem, it just makes sense to avoid ace entirely in the short run and then decide later, having established a level of airway control using a reasonable limited regimen, whether to add back ace but even then being very careful to observe the pt for worsening airway control and number of meds used/ needed to control symptoms.  Given the complexity of her care I would avoid ACE and also strongly prefer Bystolic, the most beta -1  selective Beta blocker available in sample form, with bisoprolol the most selective generic choice  on the market.

## 2010-10-12 NOTE — Progress Notes (Signed)
Summary: test result   Phone Note Call from Patient Call back at Home Phone [phone]   Caller: Patient Reason for Call: Talk to Nurse, Lab or Test Results Initial call taken by: Lorne Skeens,  April 08, 2010 10:07 AM  Follow-up for Phone Call        Phone Call Completed PT AWARE OF LAB RESULTS. Follow-up by: Scherrie Bateman, LPN,  April 08, 2010 10:19 AM

## 2010-10-12 NOTE — Progress Notes (Signed)
Summary: medication not covered by insur  Phone Note Call from Patient Call back at [phone]   Caller: Patient Call For: [name] Summary of Call: insurance won't pay for xopenex abuterol and proventil [facility] [address] Initial call taken by: Rickard Patience,  July 19, 2010 9:48 AM  Follow-up for Phone Call        pt states tha Dr. Sherene Sires changed her to xopenex in hospital, but insurance will not pay. According to [facility] formulary proventil and ventolin are prefered. Pt is on proventil, but it is not working well for her. I advised we could try to get xopenex covered with this information. Pt states she is on her way to the pharamcy and willhave them fax over a PA. I advised we will initiate once we receive the info. Carron Curie CMA  July 19, 2010 11:02 AM

## 2010-10-12 NOTE — Assessment & Plan Note (Signed)
Summary: per check out/saf  Medications Added CARVEDILOL 3.125 MG TABS (CARVEDILOL) 1 tab two times a day        Visit Type:  1 yr f/u Primary Provider:  Tresa Garter MD   History of Present Illness: [name] returns today for evaluation management of some recent chest pain and what sounds like maybe some palpitations. This was during a respiratory tract infection this when her period  The pain was not coronary when careful history taking. She's had previous coronary disease with an anterior Shanie Mauzy MI with full recovery of LV function in 2003. Her most recent Myoview was in May of 2010 which was remarkable for ejection fraction 72% with no ischemia.  She's having no symptoms of angina or ischemic equivalence at present.  She was placed on additional beta blocker. She been on propranolol and now has had carvedilol added.  Her blood pressure and lower extremity edema has been under good control. She denies orthopnea, PND. She's had no syncope.  Current Medications (verified): 1)  Hydrochlorothiazide 25 Mg  Tabs (Hydrochlorothiazide) .Marland Kitchen.. 1 By Mouth Once Daily 2)  Lisinopril 40 Mg  Tabs (Lisinopril) .Marland Kitchen.. 1 Tab By Mouth Daily 3)  Propranolol Hcl Cr 160 Mg  Cp24 (Propranolol Hcl) .Marland Kitchen.. 1 Cap By Mouth Daily 4)  Pravastatin Sodium 40 Mg  Tabs (Pravastatin Sodium) .Marland Kitchen.. 1 Tab By Mouth Daily 5)  Xanax 1 Mg Tabs (Alprazolam) .... 1/2 - 1 By Mouth Three Times A Day Prn 6)  Vitamin D3 1000 Unit  Tabs (Cholecalciferol) .Marland Kitchen.. 1 Tab Once Daily 7)  Vitamin C Cr 500 Mg Cr-Caps (Ascorbic Acid) .... Take One Daily 8)  Nitro-Dur 0.4 Mg/hr Pt24 (Nitroglycerin) .... As Needed 9)  Proventil Hfa 108 (90 Base) Mcg/act Aers (Albuterol Sulfate) .... 2 Puffs 4 Times Daily As Needed 10)  Advair Diskus 250-50 Mcg/dose Misc (Fluticasone-Salmeterol) .Marland Kitchen.. 1 Puff Two Times A Day 11)  Carvedilol 3.125 Mg Tabs (Carvedilol) .Marland Kitchen.. 1 Tab Two Times A Day  Allergies: 1)  ! Penicillin V Potassium (Penicillin V  Potassium) 2)  ! Covera-Hs (Verapamil Hcl) 3)  ! Zoloft (Sertraline Hcl) 4)  ! Librax (Clidinium-Chlordiazepoxide) 5)  ! Paxil (Paroxetine Hcl) 6)  ! Sulfadiazine (Sulfadiazine) 7)  ! Cephalexin (Cephalexin) 8)  ! Amoxicillin (Amoxicillin) 9)  ! Bromfenex 10)  ! * Tequin 11)  ! Latex 12)  ! Darvocet 13)  ! 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 To have port-a-cath placed for chemo. Has not started chemo or radiation treatment. 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 Dr [name] Onc Dr [name]  Past Surgical History: Last updated: 12/30/2008 Hysterectomy 1987 heart cath-insign cad 2/01 mri/mra brain endonoscopy 2001 asteroid trial crestor 40 mg once daily 03-28-02 porta-cath 2009  Dr [name] cardiac stent X2 in 2001 Cholecystectomy remotely  Family History: Last updated: 02/27/2009 Family History of Anxiety Family History Hypertension  Father died of pancreatic cancer.  Mother still alive Siblings: 1 sister..2 brothers alive  and well..1 brother deceased   Social History: Last updated: 02/27/2009 Former Smoker  stopped for 25  years . Alcohol use-no Regular exercise-no Getting divorced, moved to a [place]   [name] currently resides with her significant other, [employer].  She has four grown children and several grandchildren.   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  She does have a history of smoking, quit 25 years ago,   and smoked for 6-7 years.  No history of alcohol or illicit drug abuse.      Risk Factors: Exercise: no (03/29/2007)  Risk Factors: Smoking Status: quit (03/29/2007)  Review of Systems       negative other than history of present illness  Vital Signs:  Patient profile:   60 year old female Height:      64 inches Weight:      173 pounds BMI:     29.80 Pulse rate:   73 / minute Pulse rhythm:   regular BP sitting:   110 / 80  (left arm) Cuff size:   large  Vitals Entered By: Danielle Rankin, CMA (Jan 28, 2010 9:29 AM)  Physical Exam  General:  obese.  obese.   Head:  normocephalic and atraumatic Eyes:  PERRLA/EOM intact; conjunctiva and lids normal. Neck:  Neck supple, no JVD. No masses, thyromegaly or abnormal cervical nodes. Lungs:  Clear bilaterally to auscultation and percussion. Heart:  Non-displaced PMI, chest non-tender; regular rate and rhythm, S1, S2 without murmurs, rubs or gallops. Carotid upstroke normal, no bruit. Normal abdominal aortic size, no bruits. Femorals normal pulses, no bruits. Pedals normal pulses. No edema, no varicosities. Abdomen:  Bowel sounds positive; abdomen soft and non-tender without masses, organomegaly, or hernias noted. No hepatosplenomegaly. Msk:  Back normal, normal gait. Muscle strength and tone normal. Pulses:  pulses normal in all 4 extremities Extremities:  No clubbing or cyanosis. Neurologic:  Alert and oriented x 3. Skin:  Intact without lesions or rashes. Psych:  Normal affect.   Problems:  Medical Problems Added: 1)  Dx of Old Myocardial Infarction  (ICD-412) 2)  Dx  of Chest Pain-unspecified  (ICD-786.50)  EKG  Procedure date:  01/28/2010  Findings:      normal sinus rhythm, low voltage, no change  Impression & Recommendations:  Problem # 1:  CORONARY ARTERY DISEASE (ICD-414.00) Assessment Unchanged She has minimal disease at this point in time. Stress due to her study has been negative for ischemia as recently as May of 2010. Her current symptoms are non-coronary. Despite having an anterior Loucinda Croy MI in the past with a stent, she has normal left ventricular function. She is currently on 2 beta blockers with carvedilol being added most recently. Apparently was for some chest pain and palpitations with a respiratory tract infection. We will discontinue the carvedilol. I will see her back in a year. Her updated medication list for this problem includes:    Lisinopril 40 Mg Tabs (Lisinopril) .Marland Kitchen... 1 tab by mouth daily    Propranolol Hcl Cr 160 Mg Cp24 (Propranolol hcl) .Marland Kitchen... 1 cap by mouth daily    Nitro-dur 0.4 Mg/hr Pt24 (Nitroglycerin) .Marland Kitchen... As needed    Carvedilol 3.125 Mg Tabs (Carvedilol) .Marland Kitchen... 1 tab two times a day  Orders: EKG w/ Interpretation (93000)  Problem # 2:  CHEST PAIN-UNSPECIFIED (ICD-786.50) Assessment: New  Her updated medication list for this problem includes:    Lisinopril 40 Mg Tabs (Lisinopril) .Marland Kitchen... 1 tab  by mouth daily    Propranolol Hcl Cr 160 Mg Cp24 (Propranolol hcl) .Marland Kitchen... 1 cap by mouth daily    Nitro-dur 0.4 Mg/hr Pt24 (Nitroglycerin) .Marland Kitchen... As needed    Carvedilol 3.125 Mg Tabs (Carvedilol) .Marland Kitchen... 1 tab two times a day  Problem # 3:  HYPERTENSION (ICD-401.9) Assessment: Improved  Her updated medication list for this problem includes:    Hydrochlorothiazide 25 Mg Tabs (Hydrochlorothiazide) .Marland Kitchen... 1 by mouth once daily    Lisinopril 40 Mg Tabs (Lisinopril) .Marland Kitchen... 1 tab by mouth daily    Propranolol Hcl Cr 160 Mg Cp24 (Propranolol hcl) .Marland Kitchen... 1 cap by mouth daily    Carvedilol 3.125 Mg Tabs (Carvedilol) .Marland Kitchen... 1 tab two  times a day  Problem # 4:  EDEMA (ICD-782.3) Assessment: Improved  Problem # 5:  OLD MYOCARDIAL INFARCTION (ICD-412) Assessment: Unchanged  Her updated medication list for this problem includes:    Lisinopril 40 Mg Tabs (Lisinopril) .Marland Kitchen... 1 tab by mouth daily    Propranolol Hcl Cr 160 Mg Cp24 (Propranolol hcl) .Marland Kitchen... 1 cap by mouth daily    Nitro-dur 0.4 Mg/hr Pt24 (Nitroglycerin) .Marland Kitchen... As needed    Carvedilol 3.125 Mg Tabs (Carvedilol) .Marland Kitchen... 1 tab two times a day  Her updated medication list for this problem includes:    Lisinopril 40 Mg Tabs (Lisinopril) .Marland Kitchen... 1 tab by mouth daily    Propranolol Hcl Cr 160 Mg Cp24 (Propranolol hcl) .Marland Kitchen... 1 cap by mouth daily    Nitro-dur 0.4 Mg/hr Pt24 (Nitroglycerin) .Marland Kitchen... As needed    Carvedilol 3.125 Mg Tabs (Carvedilol) .Marland Kitchen... 1 tab two times a day  Clinical Reports Reviewed:  Cardiac Cath:  04/06/2004: Cardiac Cath Findings:   CONCLUSIONS:  1. Normal left ventricular and E and D diastolic pressure.  2. Mild coronary artery disease as described, which is not hemodynamically     significant.  It does not appear to be changed significantly when     compared to a previous catheterization.   PLAN:  Continued medical therapy.                                             Carole Binning, M.D. [facility] Cardiac Cath Findings:   RESULTS/HEMODYNAMICS:  Left ventricular pressure 126/14, aortic pressure  138/82.  There was no aortic valve gradient.   Left ventriculography was not performed.   Coronary arteriography (codominant).   Left main is normal.   Left anterior descending artery has a tubular 40% stenosis in the mid  vessel.  The distal vessel has diffuse luminal irregularities.  The LAD  gives rise to a large first diagonal branch and a normal size second  diagonal branch.   Left circumflex is a codominant vessel.  There is a 25% stenosis in the  ostium and proximal circumflex.  Circumflex gives rise to a large branching  obtuse marginal  and normal size first posterolateral branch, small second  posterolateral branch and a normal size third posterolateral branch.   Right coronary artery gives rise to a normal size posterior descending  artery and two small posterolateral branches.  The right coronary artery is  normal.   Intravascular ultrasound of the left circumflex revealed mild  atherosclerotic disease of the proximal and ostial left circumflex which was  nonobstructive in nature.  There was otherwise no significant disease  identified.   CONCLUSIONS:  1. Normal left  ventricular and E and D diastolic pressure.  2. Mild coronary artery disease as described, which is not hemodynamically     significant.  It does not appear to be changed significantly when     compared to a previous catheterization.   PLAN:  Continued medical therapy.                                             Carole Binning, M.D. [facility]  03/14/2002: Cardiac Cath Findings:  FINDINGS: 1. Left main trunk: A large caliber vessel with mild irregularities. 2. LAD.  This begins as a large caliber vessel and tapers distally.  It    provides three diagonal branches.  There is evidence of eccentric    calcification in the mid LAD with moderate disease of 50% by angiography.    There is mild haziness in the mid LAD as well.  The distal LAD has luminal    irregularities.  The first two diagonal branches are major vessels and have    mild irregularities.  The third diagonal branch is a triple vessel. 3. Left circumflex artery: This is a medium caliber vessel that provides three    marginal branches at the mid section and a small PDA distally.  It is    codominant.  There is mild disease of 10 to 20% by angiogram. 4. Right coronary artery is codominant.  This is a medium caliber vessel that    provides a small posterior descending artery in its terminal segment.    The right coronary artery has luminal irregularities.  LEFT VENTRICULOGRAPHY:  Normal  end-systolic and end-diastolic dimensions. Overall left ventricular function is well preserved, ejection fraction greater than 65%.  No mitral regurgitation.  LV pressure 140/10, aortic 140/85.  LV EDP equals 20.  INTRAVASCULAR ULTRASOUND:  The left circumflex artery shows mild eccentric plaque in the proximal segment.  Intravascular ultrasound of the LAD shows this to be a medium caliber vessel distally of 3 mm, a large caliber vessel of 3.5 mm proximally.  There is heavy concentric plaque as well as calcification in the mid section with a lumen of approximately 2.2 to 2.5 mm in diameter.  ASSESSMENT/PLAN:  [name] is a 60 year old female with moderate single-vessel coronary artery disease.  This does not appear to be critical at this point.  Continued medical therapy will be pursued.  Should the patient have recurrent symptoms, dobutamine echocardiogram may be helpful in determining of she has ischemia in the anterior Kym Scannell. Dictated by:   Veneda Melter, M.D. Cardiac Cath Findings:  INTRAVASCULAR ULTRASOUND:  The left circumflex artery shows mild eccentric plaque in the proximal segment.  Intravascular ultrasound of the LAD shows this to be a medium caliber vessel distally of 3 mm, a large caliber vessel of 3.5 mm proximally.  There is heavy concentric plaque as well as calcification in the mid section with a lumen of approximately 2.2 to 2.5 mm in diameter.  ASSESSMENT/PLAN:  [name] is a 60 year old female with moderate single-vessel coronary artery disease.  This does not appear to be critical at this point.  Continued medical therapy will be pursued.  Should the patient have recurrent symptoms, dobutamine echocardiogram may be helpful in determining of she has ischemia in the anterior Rees Matura. Dictated by:   Veneda Melter, M.D. Attending Physician:  Veneda Melter DD:  03/14/02 TD:  03/18/02 Job:  [id number]  Nuclear Study:  10/11/2007:    exercise capacity:Adenosine  study with no exercise blood pressure response: Normal Bp Response clinical symptoms: No CP or Dyspnea ECG impression:No significant ST segment change suggestive of ischemia overall impression: Normal stress nuclear study Dr.Peter Nishan  10/11/2007:  Excerise capacity: Adenosine study with no exercise  Blood Pressure response: Normal blood pressure repsonse  Clinical symptoms: No chest pain or dyspnea  ECG impression: No significant ST segment change suggestive of ischemia  Overall impression: Normal stress nuclear study.  Wendall Stade, MD   07/01/2002:  Final Interpretation: Stress Cardiolite with no chest pain and no diagnostic electrocardiogram changes. The scintigraphic results show no evidence of ischemia or infarction in any vascular territory. Gated ejection fraction was 76%.  Olga Millers, MD, [facility]  07/01/2002:  Final Intrepretation: Stress Cardiolite with no chest pain and no diagnostic electrocardiogram changes. The scintigraphic results show no evidence of ischemia or infarction any vacular territory. Gated ejection fraction was 76%.  Olga Millers ,MD   Patient Instructions: 1)  Your physician recommends that you schedule a follow-up appointment in: YEAR WITH DR Raejean Swinford 2)  Your physician has recommended you make the following change in your medication: STOP CARVEDILOL

## 2010-10-12 NOTE — Letter (Signed)
[facility]   Im[facility] By: Sherian Rein 03/30/2010 08:53:32  _____________________________________________________________________  External Attachment:    Type:   Image     Comment:   External Document

## 2010-10-12 NOTE — Assessment & Plan Note (Signed)
Summary: Cardiology Nuclear Testing  Nuclear Med Background Indications for Stress Test: Evaluation for Ischemia  Indications Comments: 07/18/10 Atypical CP, (-) enzymes, MI ruled out.  History: Asthma, Echo, Heart Catheterization, History of Chemo, Myocardial Perfusion Study  History Comments: '05 Cath:N/O CAD. 01/21/09 MPS: NL, EF=72%. 07/17/10 Echo: EF=55-60%.  Symptoms: Chest Pain, DOE, Fatigue, Nausea, Near Syncope, Rapid HR  Symptoms Comments: Ankle edema. expierenced rapid heart beat x 2 episodes within last 2 weeks since leaving the hospital.   Nuclear Pre-Procedure Cardiac Risk Factors: History of Smoking, Hypertension, Lipids, NIDDM Caffeine/Decaff Intake: None NPO After: 8:00 PM Lungs: clear IV 0.9% NS with Angio Cath: 20g     IV Site: L Antecubital IV Started by: Irean Hong, RN Chest Size (in) 40     Cup Size DD     Height (in): 64 Weight (lb): 172 BMI: 29.63 Tech Comments: Held bisoprolol this am.   took her inhalers prior to  recieving her low level lexiscan,   Nuclear Med Study 1 or 2 day study:  1 day     Stress Test Type:  Treadmill/Lexiscan Reading MD:  Marca Ancona, MD     Referring MD:  Valera Castle, MD Resting Radionuclide:  Technetium 51m Tetrofosmin     Resting Radionuclide Dose:  11.0 mCi  Stress Radionuclide:  Technetium 39m Tetrofosmin     Stress Radionuclide Dose:  33.0 mCi   Stress Protocol   Lexiscan: 0.4 mg   Stress Test Technologist:  Frederick Peers, EMT-P     Nuclear Technologist:  Domenic Polite, CNMT  Rest Procedure  Myocardial perfusion imaging was performed at rest 45 minutes following the intravenous administration of Technetium 71m Tetrofosmin.  Stress Procedure  The patient received IV Lexiscan 0.4 mg over 15-seconds with concurrent low level exercise and then Technetium 91m Tetrofosmin was injected at 30-seconds while the patient continued walking one more minute.  There were no significant changes with Lexiscan.  Quantitative  spect images were obtained after a 45 minute delay.  QPS Raw Data Images:  Normal; no motion artifact; normal heart/lung ratio. Stress Images:  Normal homogeneous uptake in all areas of the myocardium. Rest Images:  Normal homogeneous uptake in all areas of the myocardium. Subtraction (SDS):  There is no evidence of scar or ischemia. Transient Ischemic Dilatation:  .90  (Normal <1.22)  Lung/Heart Ratio:  .32  (Normal <0.45)  Quantitative Gated Spect Images QGS EDV:  52 ml QGS ESV:  13 ml QGS EF:  75 % QGS cine images:  Normal wall motion.    Overall Impression  Exercise Capacity: Lexiscan with no exercise. BP Response: Hypertensive blood pressure response. Clinical Symptoms: Short of breath, chest tightness.  ECG Impression: No significant ST segment change suggestive of ischemia. Overall Impression: Normal stress nuclear study.  Appended Document: Cardiology Nuclear Testing normal, great news.

## 2010-10-12 NOTE — Progress Notes (Signed)
Summary: chest tightness with sob and arms feeling heavy   Phone Note Call from Patient Call back at Home Phone [phone]   Caller: Patient Summary of Call: Pt having chest tightness and sob and arms feeling heavy Initial call taken by: Judie Grieve,  July 16, 2010 1:43 PM  Follow-up for Phone Call        Pt calls with chest pain radiating with pressure to both arms right greater than left.  7/10 .  She also says it feels like her heart is racing.  She has taken 1 sl. ntg while on the phone with me and her "pain in the center of my chest is gone".  She is still having pressure in both arms. She says that she is feeling "awful". She is alone at work.  Advised her to take another sl. ntg and call 911.   Mylo Red RN     Appended Document: chest tightness with sob and arms feeling heavy agree with plan per Wallace Cullens RN

## 2010-10-12 NOTE — Letter (Signed)
Summary: [facility]   Imported By: Sherian Rein 11/10/2009 08:51:36  _____________________________________________________________________  External Attachment:    Type:   Image     Comment:   External Document

## 2010-10-12 NOTE — Progress Notes (Signed)
Summary: test results   Phone Note Call from Patient Call back at [phone]   Caller: Patient Reason for Call: Talk to Nurse, Lab or Test Results Summary of Call: nuc. stress test results Initial call taken by: Roe Coombs,  August 12, 2010 11:35 AM  Follow-up for Phone Call        advised pt. that I could not give her the results until Dr. Daleen Squibb has signed and reviewed it. We will notify her as soon as we get the results. She understands.  Whitney Maeola Sarah RN  August 12, 2010 11:41 AM  Follow-up by: Whitney Maeola Sarah RN,  August 12, 2010 11:41 AM     Appended Document: test results Pt aware of results. Reassurance given. Mylo Red RN

## 2010-10-12 NOTE — Progress Notes (Signed)
Summary: Nuc. Pre-Procedure  Phone Note Outgoing Call Call back at [facility] Phone [phone] Call back at cell [phone]   Call placed by: Irean Hong, RN,  August 10, 2010 3:11 PM Summary of Call: Reviewed information on Myoview Information Sheet (see scanned document for further details).  Spoke with patient.     Nuclear Med Background Indications for Stress Test: Evaluation for Ischemia  Indications Comments: 07/18/10 Atypical CP, (-) enzymes, MI ruled out.  History: Asthma, Echo, Heart Catheterization, History of Chemo, Myocardial Perfusion Study  History Comments: '05 Cath:N/O CAD. 01/21/09 MPS: NL, EF=72%. 07/17/10 Echo: EF=55-60%.  Symptoms: Chest Pain, DOE  Symptoms Comments: Ankle edema.   Nuclear Pre-Procedure Cardiac Risk Factors: History of Smoking, Hypertension, Lipids, NIDDM Height (in): 64  Nuclear Med Study Referring MD:  Valera Castle, MD

## 2010-10-12 NOTE — Assessment & Plan Note (Signed)
Summary: per pt call/edema in feel and legs/lg  Medications Added FUROSEMIDE 20 MG TABS (FUROSEMIDE) Take 1 tablet a day. CALCIUM 600 MG TABS (CALCIUM) 1 tab once daily        Visit Type:  rov Primary Provider:  Dr. Secundino Ginger  CC:  edema/feet/legs...pt states that her left leg has been cramping  also states her left arm has been numb after laying on it at night.  History of Present Illness: [name] comes in today for increased lower extremity edema particularly at the end of the day. She denies any orthopnea, PND. She denies any ischemic chest pain.  She's had some aching in her left arm is after she laid on it. She feels better with activities such as swimming.  The swelling isn't particularly bad this summer. Her feet started to hurt and cramp. She denies any claudication. She has only gained 1 pound since I last saw her. She is currently on HCTZ.  Current Medications (verified): 1)  Hydrochlorothiazide 25 Mg  Tabs (Hydrochlorothiazide) .Marland Kitchen.. 1 By Mouth Once Daily 2)  Lisinopril 40 Mg  Tabs (Lisinopril) .Marland Kitchen.. 1 Tab By Mouth Daily 3)  Propranolol Hcl Cr 160 Mg  Cp24 (Propranolol Hcl) .Marland Kitchen.. 1 Cap By Mouth Daily 4)  Pravastatin Sodium 40 Mg  Tabs (Pravastatin Sodium) .Marland Kitchen.. 1 Tab By Mouth Daily 5)  Xanax 1 Mg Tabs (Alprazolam) .... 1/2 - 1 By Mouth Three Times A Day Prn 6)  Vitamin D3 1000 Unit  Tabs (Cholecalciferol) .Marland Kitchen.. 1 Tab Once Daily 7)  Vitamin C Cr 500 Mg Cr-Caps (Ascorbic Acid) .... Take One Daily 8)  Nitro-Dur 0.4 Mg/hr Pt24 (Nitroglycerin) .... As Needed 9)  Proventil Hfa 108 (90 Base) Mcg/act Aers (Albuterol Sulfate) .... 2 Puffs 4 Times Daily As Needed 10)  Advair Diskus 250-50 Mcg/dose Misc (Fluticasone-Salmeterol) .Marland Kitchen.. 1 Puff Two Times A Day 11)  Calcium 600 Mg Tabs (Calcium) .Marland Kitchen.. 1 Tab Once Daily  Allergies: 1)  ! Penicillin V Potassium (Penicillin V Potassium) 2)  ! Covera-Hs (Verapamil Hcl) 3)  ! Zoloft (Sertraline Hcl) 4)  ! Librax (Clidinium-Chlordiazepoxide) 5)   ! Paxil (Paroxetine Hcl) 6)  ! Sulfadiazine (Sulfadiazine) 7)  ! Cephalexin (Cephalexin) 8)  ! Amoxicillin (Amoxicillin) 9)  ! Bromfenex 10)  ! * Tequin 11)  ! Latex 12)  ! Darvocet 13)  ! 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 To have port-a-cath placed for chemo. Has not started chemo or radiation treatment. 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 Dr Derrell Lolling Onc Dr Donnie Coffin  Past Surgical History: Last updated: 12/30/2008 Hysterectomy 1987 heart cath-insign cad 2/01 mri/mra brain endonoscopy 2001 asteroid trial crestor 40 mg once daily 03-28-02 porta-cath 2009  Dr Derrell Lolling cardiac stent X2 in 2001 Cholecystectomy remotely  Family History: Last updated: 02/27/2009 Family History of Anxiety Family History Hypertension  Father died of pancreatic cancer.  Mother still alive Siblings: 1 sister..2 brothers alive and well..1 brother deceased   Social History: Last updated:  02/27/2009 Former Smoker  stopped for 25 years . Alcohol use-no Regular exercise-no Getting divorced, moved to a boyfreind 2008   [name] currently resides with her  significant other, [employer].  She has four grown children and several grandchildren.   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  She does have a history of smoking, quit 25 years ago,   and smoked for 6-7 years.  No history of alcohol or illicit drug abuse.      Risk Factors: Exercise: no (03/29/2007)  Risk Factors: Smoking Status: quit (03/29/2007)  Review of Systems       negative other than history of present illness  Vital Signs:  Patient profile:   60 year old female Height:      64 inches Weight:      174 pounds BMI:     29.97 Pulse rate:   68 / minute Pulse rhythm:   regular BP sitting:   112 / 80  (left arm) Cuff size:   large  Vitals Entered By: Danielle Rankin, CMA (March 23, 2010 12:12 PM)  Physical Exam  General:  obese.  obese.   Head:  normocephalic and atraumatic Eyes:  PERRLA/EOM intact; conjunctiva and lids normal. Neck:  Neck supple, no JVD. No masses, thyromegaly or abnormal cervical nodes. Chest Deneane Stifter:  no deformities or breast masses noted Lungs:  Clear bilaterally to auscultation and percussion. Heart:  Non-displaced PMI, chest non-tender; regular rate and rhythm, S1, S2 without murmurs, rubs or gallops. Carotid upstroke normal, no bruit. Normal abdominal aortic size, no bruits. Femorals normal pulses, no bruits. Pedals normal pulses. No edema, no varicosities. Msk:  Back normal, normal gait. Muscle strength and tone normal. Pulses:  pulses normal in all 4 extremities Extremities:  trace left pedal edema and trace right pedal edema.  trace left pedal edema.   Neurologic:  Alert and oriented x 3. Skin:  Intact without lesions or rashes. Psych:  Normal affect.   Impression & Recommendations:  Problem # 1:  EDEMA (ICD-782.3) Assessment Deteriorated Today she has very little swelling. Her weight is only up 1 pound. However, she says again today is very uncomfortable.  I've asked her to restrict  sodium further. I changed her HCTZ to Lasix 20 mg per day. We will check a set of electrolytes and a couple weeks. I'll see her back in 6 months.  Problem # 2:  CORONARY ARTERY DISEASE (ICD-414.00)  The following medications were removed from the medication list:    Carvedilol 3.125 Mg Tabs (Carvedilol) .Marland Kitchen... 1 tab two times a day Her updated medication list for this problem includes:    Lisinopril 40 Mg Tabs (Lisinopril) .Marland Kitchen... 1 tab by mouth daily    Propranolol Hcl Cr 160 Mg Cp24 (Propranolol hcl) .Marland Kitchen... 1 cap by mouth daily    Nitro-dur 0.4 Mg/hr Pt24 (Nitroglycerin) .Marland Kitchen... As needed  The following medications were removed from the medication list:    Carvedilol 3.125 Mg Tabs (Carvedilol) .Marland Kitchen... 1 tab two times a day Her updated medication list for this problem includes:    Lisinopril 40 Mg Tabs (Lisinopril) .Marland Kitchen... 1 tab by mouth daily    Propranolol Hcl Cr 160 Mg Cp24 (Propranolol hcl) .Marland Kitchen... 1 cap by mouth daily    Nitro-dur 0.4 Mg/hr Pt24 (Nitroglycerin) .Marland Kitchen... As needed  Problem # 3:  OLD MYOCARDIAL INFARCTION (ICD-412) Assessment: Unchanged  The following medications were removed from the medication list:  Carvedilol 3.125 Mg Tabs (Carvedilol) .Marland Kitchen... 1 tab two times a day Her updated medication list for this problem includes:    Lisinopril 40 Mg Tabs (Lisinopril) .Marland Kitchen... 1 tab by mouth daily    Propranolol Hcl Cr 160 Mg Cp24 (Propranolol hcl) .Marland Kitchen... 1 cap by mouth daily    Nitro-dur 0.4 Mg/hr Pt24 (Nitroglycerin) .Marland Kitchen... As needed  The following medications were removed from the medication list:    Carvedilol 3.125 Mg Tabs (Carvedilol) .Marland Kitchen... 1 tab two times a day Her updated medication list for this problem includes:    Lisinopril 40 Mg Tabs (Lisinopril) .Marland Kitchen... 1 tab by mouth daily    Propranolol Hcl Cr 160 Mg Cp24 (Propranolol hcl) .Marland Kitchen... 1 cap by mouth daily    Nitro-dur 0.4 Mg/hr Pt24 (Nitroglycerin) .Marland Kitchen... As needed  Problem # 4:  HYPERTENSION (ICD-401.9)  The following  medications were removed from the medication list:    Carvedilol 3.125 Mg Tabs (Carvedilol) .Marland Kitchen... 1 tab two times a day Her updated medication list for this problem includes:    Furosemide 20 Mg Tabs (Furosemide) .Marland Kitchen... Take 1 tablet a day.    Lisinopril 40 Mg Tabs (Lisinopril) .Marland Kitchen... 1 tab by mouth daily    Propranolol Hcl Cr 160 Mg Cp24 (Propranolol hcl) .Marland Kitchen... 1 cap by mouth daily  The following medications were removed from the medication list:    Carvedilol 3.125 Mg Tabs (Carvedilol) .Marland Kitchen... 1 tab two times a day Her updated medication list for this problem includes:    Hydrochlorothiazide 25 Mg Tabs (Hydrochlorothiazide) .Marland Kitchen... 1 by mouth once daily    Lisinopril 40 Mg Tabs (Lisinopril) .Marland Kitchen... 1 tab by mouth daily    Propranolol Hcl Cr 160 Mg Cp24 (Propranolol hcl) .Marland Kitchen... 1 cap by mouth daily  Patient Instructions: 1)  Your physician recommends that you schedule a follow-up appointment in: 6 months with Dr. Daleen Squibb 2)  Your physician has recommended you make the following change in your medication: STOP HCTZ    START Furosemide 20mg  1 tablet daily 3)  Your physician recommends that you return for lab work in a couple of weeks for a BMP  782.3 4)  Your physician has requested that you limit the intake of sodium (salt) in your diet to two grams daily. Please see [facility] handout. Prescriptions: FUROSEMIDE 20 MG TABS (FUROSEMIDE) Take 1 tablet a day.  #30 x 6   Entered by:   Lisabeth Devoid RN   Authorized by:   Gaylord Shih, MD, [facility]   Signed by:   Lisabeth Devoid RN on 03/23/2010   Method used:   Electronically to        [facility] [phone]* (retail)       [address] [place], [place]  [address]       Ph: [phone] or [phone]       Fax: [phone]   RxID:   [id number]   Appended Document: per pt call/edema in feel and legs/lg no change in treatment.

## 2010-10-12 NOTE — Consult Note (Signed)
Summary: [place]   Imported By: Roderic Ovens 07/26/2010 09:25:04  _____________________________________________________________________  External Attachment:    Type:   Image     Comment:   External Document

## 2010-10-12 NOTE — Assessment & Plan Note (Signed)
Summary: Pulmonary/ ext p hosp ov with hfa 75% p coaching   Visit Type:  Hospital Follow-up Primary Provider/Referring Provider:  Dr. Secundino Ginger  CC:  Cough- much better.  History of Present Illness: 33 yowm quit smoking around 1985 with no respirartory problems at all.   July 16, 2010  1st pulmonary eval in EMR era for > 2 years sob and refractory cough and wheeze since 05/2010, never previously had any fall allegies.  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 Pt denies any significant sore throat, dysphagia, itching, sneezing,  nasal congestion or excess secretions,  fever, chills, sweats, unintended wt loss, pleuritic or exertional cp, hempoptysis, change in activity tolerance  orthopnea pnd or leg swelling Pt also denies any obvious fluctuation in symptoms with weather or environmental change or other alleviating or aggravating factors.       Current Medications (verified): 1)  Furosemide 20 Mg Tabs (Furosemide) .... Take 1 Tablet A Day. 2)  Bisoprolol Fumarate 5 Mg Tabs (Bisoprolol Fumarate) .Marland Kitchen.. 1 Tab Two Times A Day 3)  Diovan 160 Mg Tabs (Valsartan) .Marland Kitchen.. 1 Cap Once Daily 4)  Pravastatin Sodium 20 Mg Tabs (Pravastatin Sodium) .Marland Kitchen.. 1 Tab Once Daily 5)  Xanax 1 Mg Tabs (Alprazolam) .... 1/2 - 1 By Mouth Three Times A Day Prn 6)  Vitamin D3 1000 Unit  Tabs (Cholecalciferol) .Marland Kitchen.. 1 Tab Once Daily 7)  Vitamin C 500 Mg Tabs (Ascorbic Acid) .Marland Kitchen.. 1 Tab Once Daily 8)  Nitrostat 0.4 Mg Subl (Nitroglycerin) .Marland Kitchen.. 1 Tablet Under Tongue At Onset of Chest Pain; You May Repeat Every 5 Minutes For Up To 3 Doses. 9)  Proventil Hfa 108 (90 Base) Mcg/act Aers (Albuterol Sulfate) .... 2 Puffs 4 Times Daily As Needed 10)  Calcium-Carb 600 + D 600-125 Mg-Unit Tabs (Calcium Carbonate-Vitamin D) .Marland Kitchen.. 1 Tab Once Daily 11)  Tramadol Hcl 50 Mg Tabs (Tramadol Hcl) .Marland Kitchen.. 1 Tab Four Times Daily As Needed 12)   Xopenex Hfa 45 Mcg/act Aero (Levalbuterol Tartrate) .... 2 Puffs Q 6 Hour As Needed 13)  Aspirin 81 Mg Tbec (Aspirin) .... Take One Tablet By Mouth Daily 14)  Restasis 0.05 % Emul (Cyclosporine) .Marland Kitchen.. 1 Gtt Ou Two Times A Day 15)  Proventil Hfa 108 (90 Base) Mcg/act Aers (Albuterol Sulfate) .... 2 Puffs Q 4 Hour As Needed 16)  Fluticasone Propionate 50 Mcg/act Susp (Fluticasone Propionate) .... Use As Directed 17)  Cetirizine Hcl 10 Mg Tabs (Cetirizine Hcl) .Marland Kitchen.. 1 Tab Once Daily 18)  Dulera 100-5 Mcg/act Aero (Mometasone Furo-Formoterol Fum) .... Use As Directed  Allergies (verified): 1)  ! Penicillin V Potassium (Penicillin V Potassium) 2)  ! Covera-Hs (Verapamil Hcl) 3)  ! Zoloft (Sertraline Hcl) 4)  ! Librax (Clidinium-Chlordiazepoxide) 5)  ! Paxil (Paroxetine Hcl) 6)  ! Sulfadiazine (Sulfadiazine) 7)  ! Cephalexin (Cephalexin) 8)  ! Amoxicillin (Amoxicillin) 9)  ! Bromfenex 10)  ! * Tequin 11)  ! Latex 12)  ! Darvocet 13)  ! Zithromax  Past History:  Past Medical History: MYOCARDIAL INFARCTION, ANTERIOR WALL (ICD-410.10) CORONARY ARTERY DISEASE (ICD-414.00) HYPERTENSION (ICD-401.9)      - D/c ace July 16, 2010 (cough/ pseudoasthma)> resolved  ANTICOAGULATION THERAPY (ICD-V58.61) EDEMA (ICD-782.3) DIARRHEA (ICD-787.91) HEMATOMA (ICD-924.9) ASTHMA (ICD-493.90)    - HFA 0-75% p coaching  August 18, 2010  OTHER DRUG ALLERGY (ICD-995.27) SINUSITIS, ACUTE (ICD-461.9)       -  Sinus CT  July 16, 2010 >>>  L max thickening only UPPER RESPIRATORY INFECTION (URI) (ICD-465.9) BREAST CANCER, HX OF (ICD-V10.3)dx'd 12/08. To have port-a-cath placed for chemo. Has not started chemo or radiation treatment. 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 Dr Derrell Lolling Onc Dr Donnie Coffin  Family History: Reviewed history from 02/27/2009 and no changes required. Family History of Anxiety Family History Hypertension  Father died of pancreatic cancer.  Mother still alive Siblings: 1 sister..2 brothers alive and well..1 brother deceased   Social History: Reviewed history from 07/16/2010 and no changes required. Former Smoker  stopped for around 1985 Alcohol use-no Regular exercise-no Getting divorced, moved to a boyfreind 2008   Lenox currently resides with her significant other, [employer].  She has four grown children and several grandchildren.   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  She does have a history of smoking, quit 25 years ago,   and smoked for 6-7 years.  No history of alcohol or illicit drug abuse.      Vital Signs:  Patient profile:   60 year old female Weight:      173 pounds O2 Sat:      97 % on Room air Temp:     97.9 degrees F oral Pulse rate:   59 / minute BP sitting:   136 / 86  (left arm)  Vitals Entered By: Vernie Murders (August 18, 2010 11:15 AM)  O2 Flow:  Room air  Physical Exam  Additional Exam:  wt 172 > 173 August 18, 2010  obese wf who failed to answer a single question asked in a straightforward manner, tending to go off on tangents or answer questions with ambiguous medical terms or diagnoses and seemed perlexed  when asked the same question more than once for clarification.  HEENT: nl dentition, turbinates, and orophanx. Nl external ear canals without cough reflex NECK :  without JVD/Nodes/TM/ nl carotid upstrokes bilaterally LUNGS: no acc muscle use, clear to A and P bilaterally without cough on insp or exp maneuvers CV:  RRR   no s3 or murmur or increase in P2, no edema  ABD:  soft and nontender with nl excursion in the supine position. No bruits or organomegaly, bowel sounds nl MS:  warm without deformities, calf tenderness, cyanosis or clubbing SKIN: warm and dry without lesions   NEURO:  alert, approp, no deficits     Impression & Recommendations:  Problem # 1:  ASTHMA (ICD-493.90)   DDX of  difficult airways managment all start with A and  include Adherence, Ace Inhibitors, Acid Reflux, Active Sinus Disease, Alpha 1 Antitripsin deficiency, Anxiety masquerading as Airways dz,  ABPA,  allergy(esp in young), Aspiration (esp in elderly), Adverse effects of DPI,  Active smokers, plus one B  = Beta blocker use..    Most likely this was combination of ace effect plus the effect of inderol, rec continue off both  ? Adherence:  I spent extra time with the patient today explaining optimal mdi  technique.  This improved from  0-75% p coaching  ? active sinus ct excluded by ct sinus during recent  admit with flare of symptoms. continue to treat rhinitis for now with flonase.  See instructions for specific recommendations   Problem # 2:  HYPERTENSION (ICD-401.9)  Her updated medication list for this problem includes:    Furosemide 20 Mg Tabs (Furosemide) .Marland Kitchen... Take 1 tablet a day.    Bisoprolol Fumarate 5 Mg Tabs (Bisoprolol fumarate) .Marland Kitchen... 1 tab two times a day    Diovan 160 Mg Tabs (Valsartan) .Marland Kitchen... 1 cap once daily    ACE inhibitors are problematic in  pts with airway complaints because  even experienced pulmonologists can't always distinguish ace effects from copd/asthma.  By themselves they don't actually cause a problem, much like oxygen can't by itself start a fire, but they certainly serve as a powerful catalyst or enhancer for any "fire"  or inflammatory process in the upper airway, be it caused by an ET  tube or more commonly reflux (especially in the obese or pts with known GERD or who are on biphoshonates).   In the era of ARB near equivalency until we have a better handle on the reversibility of the airway problem, it just makes sense to avoid ace entirely in the short run and then decide later, having established a level of airway control using a reasonable limited regimen, whether to add back ace but even then being very careful to observe the pt for worsening airway control and number of meds used/ needed to control symptoms.    In her case given how difficult it is to assess her symptoms I would like to avoid this class completely and avoid non-specific BB alsol  Orders: Est. Patient Level IV ([address]109)  Medications Added to Medication List This Visit: 1)  Prilosec Otc 20 Mg Tbec (Omeprazole magnesium) .... Take  one 30-60 min before first meal of the day 2)  Pepcid 20 Mg Tabs (Famotidine) .... Take one by mouth at bedtime 3)  Fluticasone Propionate 50 Mcg/act Susp (Fluticasone propionate) .... One at bedtime  Other Orders: HFA Instruction [id number])  Patient Instructions: 1)  Work on inhaler technique:  relax and blow all the way out then take a nice smooth deep breath back in, triggering the inhaler at same time you start breathing in  2)  Flonase  has  no immediate benefit in terms of improving symptoms. To help them reached the target tissue, the patient should use Afrin two puffs every 12 hours applied one min before using the nasal steroids.  Afrin should be stopped after no more than 5 days.  If the symptoms worsen, Afrin can be restarted after 5 days off of therapy to prevent rebound congestion from overuse of Afrin.  I also emphasized that in no way are nasal steroids a concern in terms of "addiction". 3)  Prilosec 20 mg Take  one 30-60 min before first meal of the day and pecid 20 mg one at bedtime  4)  Please schedule a follow-up appointment in 6 weeks, sooner if needed  5)   Think of your medications in 3 separate categories and keep them separate AND BRING THEM WITH YOU TO [facility] OFFICE  VISIT  6)  a)  The ones you take no matter what daily on a scheduled basis these belong in a pillbox but also include flonase  7)  b)   The ones you only take if needed for specific problems 8)  c)   The ones you take for a short course and stop, like antibiotics and prednisone. 9)  GERD (REFLUX)  is a common cause of respiratory symptoms. It commonly presents without heartburn and can be treated with medication, but also with lifestyle changes including avoidance of late meals, excessive alcohol, smoking cessation, and avoid fatty foods, chocolate, peppermint, colas, red wine, and acidic juices such as orange juice. NO MINT OR MENTHOL PRODUCTS SO NO COUGH DROPS  10)  USE SUGARLESS CANDY INSTEAD (jolley ranchers)  11)  NO OIL BASED VITAMINS  12)  Please schedule a follow-up appointment in 4 weeks, sooner if needed

## 2010-10-14 NOTE — Assessment & Plan Note (Signed)
Summary: f17m/dfg  Medications Added DULERA 100-5 MCG/ACT AERO (MOMETASONE FURO-FORMOTEROL FUM) 2 puffs at bedtime      Allergies Added:   Visit Type:  Follow-up  CC:  no complaints.  Current Medications (verified): 1)  Furosemide 20 Mg Tabs (Furosemide) .... Take 1 Tablet A Day. 2)  Bisoprolol Fumarate 5 Mg Tabs (Bisoprolol Fumarate) .Marland Kitchen.. 1 Tab Two Times A Day 3)  Diovan 160 Mg Tabs (Valsartan) .Marland Kitchen.. 1 Cap Once Daily 4)  Pravastatin Sodium 20 Mg Tabs (Pravastatin Sodium) .Marland Kitchen.. 1 Tab Once Daily 5)  Vitamin D3 1000 Unit  Tabs (Cholecalciferol) .Marland Kitchen.. 1 Tab Once Daily 6)  Calcium-Carb 600 + D 600-125 Mg-Unit Tabs (Calcium Carbonate-Vitamin D) .Marland Kitchen.. 1 Tab Once Daily 7)  Vitamin C 500 Mg Tabs (Ascorbic Acid) .Marland Kitchen.. 1 Tab Once Daily 8)  Aspirin 81 Mg Tbec (Aspirin) .... Take One Tablet By Mouth Daily 9)  Pepcid 20 Mg Tabs (Famotidine) .... Take One By Mouth At Bedtime 10)  Nitrostat 0.4 Mg Subl (Nitroglycerin) .Marland Kitchen.. 1 Tablet Under Tongue At Onset of Chest Pain; You May Repeat Every 5 Minutes For Up To 3 Doses. 11)  Restasis 0.05 % Emul (Cyclosporine) .Marland Kitchen.. 1 Gtt Ou Two Times A Day 12)  Fluticasone Propionate 50 Mcg/act Susp (Fluticasone Propionate) .... One At Bedtime 13)  Proventil Hfa 108 (90 Base) Mcg/act Aers (Albuterol Sulfate) .... 2 Puffs 4 Times Daily As Needed 14)  Dulera 100-5 Mcg/act Aero (Mometasone Furo-Formoterol Fum) .... 2 Puffs At Bedtime 15)  Xanax 1 Mg Tabs (Alprazolam) .... 1/2 - 1 By Mouth Three Times A Day Prn 16)  Cetirizine Hcl 10 Mg Tabs (Cetirizine Hcl) .Marland Kitchen.. 1 Tab Once Daily  Allergies (verified): 1)  ! Penicillin V Potassium (Penicillin V Potassium) 2)  ! Covera-Hs (Verapamil Hcl) 3)  ! Zoloft (Sertraline Hcl) 4)  ! Librax (Clidinium-Chlordiazepoxide) 5)  ! Paxil (Paroxetine Hcl) 6)  ! Sulfadiazine (Sulfadiazine) 7)  ! Cephalexin (Cephalexin) 8)  ! Amoxicillin (Amoxicillin) 9)  ! Bromfenex 10)  ! * Tequin 11)  ! Latex 12)  ! Darvocet 13)  ! Zithromax  Vital  Signs:  Patient profile:   60 year old female Height:      64 inches Weight:      173 pounds BMI:     29.80 Pulse rate:   75 / minute BP sitting:   132 / 80  (left arm) Cuff size:   regular  Vitals Entered By: Hardin Negus, RMA (September 15, 2010 12:16 PM)   Impression & Recommendations:  Problem # 1:  CORONARY ARTERY DISEASE (ICD-414.00) Assessment Unchanged I review her recent Myoview with her. No changes in medications. I'll see her back in 6 months. Her updated medication list for this problem includes:    Bisoprolol Fumarate 5 Mg Tabs (Bisoprolol fumarate) .Marland Kitchen... 1 tab two times a day    Aspirin 81 Mg Tbec (Aspirin) .Marland Kitchen... Take one tablet by mouth daily    Nitrostat 0.4 Mg Subl (Nitroglycerin) .Marland Kitchen... 1 tablet under tongue at onset of chest pain; you may repeat every 5 minutes for up to 3 doses.  Her updated medication list for this problem includes:    Bisoprolol Fumarate 5 Mg Tabs (Bisoprolol fumarate) .Marland Kitchen... 1 tab two times a day    Aspirin 81 Mg Tbec (Aspirin) .Marland Kitchen... Take one tablet by mouth daily    Nitrostat 0.4 Mg Subl (Nitroglycerin) .Marland Kitchen... 1 tablet under tongue at onset of chest pain; you may repeat every 5 minutes for up to 3  doses.  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 1 tab two times a day    Aspirin 81 Mg Tbec (Aspirin) .Marland Kitchen... Take one tablet by mouth daily    Nitrostat 0.4 Mg Subl (Nitroglycerin) .Marland Kitchen... 1 tablet under tongue at onset of chest pain; you may repeat every 5 minutes for up to 3 doses.  Her updated medication list for this problem includes:    Bisoprolol Fumarate 5 Mg Tabs (Bisoprolol fumarate) .Marland Kitchen... 1 tab two times a day    Aspirin 81 Mg Tbec (Aspirin) .Marland Kitchen... Take one tablet by mouth daily    Nitrostat 0.4 Mg Subl (Nitroglycerin) .Marland Kitchen... 1 tablet under tongue at onset of chest pain; you may repeat every 5  minutes for up to 3 doses.  Patient Instructions: 1)  Your physician recommends that you schedule a follow-up appointment in: 6 months in [id number] 2)  Your physician recommends that you continue on your current medications as directed. Please refer to the Current Medication list given to you today.

## 2010-10-14 NOTE — Assessment & Plan Note (Signed)
Summary: Pulmonary/ ext f/u ov with walking sats = nl x 3 laps   Primary Provider/Referring Provider:  Dr. Secundino Ginger  CC:  Cough- resolved.  History of Present Illness: 60 yowm quit smoking around 1985 with no respirartory problems at all.   July 16, 2010  1st pulmonary eval in EMR era for > 2 years sob and refractory cough and wheeze since 05/2010, never previously had any fall allegies.  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 rec Work on inhaler technique:  relax and blow all the way out then take a nice smooth deep breath back in, triggering the inhaler at same time you start breathing in  Flonase  has  no immediate benefit in terms of improving symptoms.  Prilosec 20 mg Take  one 30-60 min before first meal of the day and pecid 20 mg one at bedtime  Please schedule a follow-up appointment in 6 weeks, sooner if needed   Think of your medications in 3 separate categories and keep them separate AND BRING THEM WITH YOU TO [facility] OFFICE VISIT  a)  The ones you take no matter what daily on a scheduled basis these belong in a pillbox but also include flonase  b)   The ones you only take if needed for specific problems c)   The ones you take for a short course and stop, like antibiotics and prednisone. GERD (REFLUX)  diet  October 01, 2010 ov cc cough resolved  but still feels she needs the saba up to 3 times esp with activities like going to the dumpster and back - not clear whether sitting and resting works just as well to relieve sob. Pt denies any significant sore throat, dysphagia, itching, sneezing,  nasal congestion or excess secretions,  fever, chills, sweats, unintended wt loss, pleuritic or exertional cp, hempoptysis, change in activity tolerance  orthopnea pnd or leg swelling. did not bring meds in separate bags as requested. count on dulera is way off.  Current Medications  (verified): 1)  Furosemide 20 Mg Tabs (Furosemide) .... Take 1 Tablet A Day. 2)  Bisoprolol Fumarate 5 Mg Tabs (Bisoprolol Fumarate) .Marland Kitchen.. 1 Tab Two Times A Day 3)  Diovan 160 Mg Tabs (Valsartan) .Marland Kitchen.. 1 Cap Once Daily 4)  Pravastatin Sodium 20 Mg Tabs (Pravastatin Sodium) .Marland Kitchen.. 1 Tab Once Daily 5)  Vitamin D3 1000 Unit  Tabs (Cholecalciferol) .Marland Kitchen.. 1 Tab Once Daily 6)  Calcium-Carb 600 + D 600-125 Mg-Unit Tabs (Calcium Carbonate-Vitamin D) .Marland Kitchen.. 1 Tab Once Daily 7)  Vitamin C 500 Mg Tabs (Ascorbic Acid) .Marland Kitchen.. 1 Tab Once Daily 8)  Aspirin 81 Mg Tbec (Aspirin) .... Take One Tablet By Mouth Daily 9)  Pepcid 20 Mg Tabs (Famotidine) .... Take One By Mouth At Bedtime As Needed 10)  Nitrostat 0.4 Mg Subl (Nitroglycerin) .Marland Kitchen.. 1 Tablet Under Tongue At Onset of Chest Pain; You May Repeat Every 5 Minutes For Up To 3 Doses. 11)  Restasis 0.05 % Emul (Cyclosporine) .Marland Kitchen.. 1 Gtt Ou Two Times A Day 12)  Fluticasone Propionate 50 Mcg/act Susp (Fluticasone Propionate) .Marland Kitchen.. 1 Spray Each Nostril At Bedtime As Needed 13)  Ventolin Hfa 108 (90 Base) Mcg/act Aers (Albuterol Sulfate) .... 2 Puffs Every 4-6 Hrs As Needed 14)  Dulera 100-5 Mcg/act Aero (Mometasone Furo-Formoterol Fum) .... 2 Puffs At Bedtime 15)  Xanax 1 Mg Tabs (Alprazolam) .Marland KitchenMarland KitchenMarland Kitchen  1/2 - 1 By Mouth Three Times A Day Prn 16)  Cetirizine Hcl 10 Mg Tabs (Cetirizine Hcl) .Marland Kitchen.. 1 Tab Once Daily  Allergies (verified): 1)  ! Penicillin V Potassium (Penicillin V Potassium) 2)  ! Covera-Hs (Verapamil Hcl) 3)  ! Zoloft (Sertraline Hcl) 4)  ! Librax (Clidinium-Chlordiazepoxide) 5)  ! Paxil (Paroxetine Hcl) 6)  ! Sulfadiazine (Sulfadiazine) 7)  ! Cephalexin (Cephalexin) 8)  ! Amoxicillin (Amoxicillin) 9)  ! Bromfenex 10)  ! * Tequin 11)  ! Latex 12)  ! Darvocet 13)  ! Zithromax  Vital Signs:  Patient profile:   60 year old female Weight:      173 pounds O2 Sat:      96 % on Room air Temp:     98.5 degrees F oral Pulse rate:   73 / minute BP sitting:   142  / 90  (left arm)  Vitals Entered By: Vernie Murders (October 01, 2010 9:22 AM)  O2 Flow:  Room air  Serial Vital Signs/Assessments:  Comments: Ambulatory Pulse Oximetry  Resting; HR__79___    02 Sat__96%RA___  Lap1 (185 feet)   HR__95___   02 Sat__97%RA___ Lap2 (185 feet)   HR__95___   02 Sat__97%RA___    Lap3 (185 feet)   HR__94___   02 Sat__98%RA___  _X__Test Completed without Difficulty Zackery Barefoot CMA  October 01, 2010 10:06 AM  ___Test Stopped due to:   By: Zackery Barefoot CMA    Physical Exam  Additional Exam:  wt 172 > 173 August 18, 2010 > 173 October 01, 2010  obese wf who continues to fail  to answer a single question asked in a straightforward manner, tending to go off on tangents or answer questions with ambiguous medical terms or diagnoses and seemed perlexed  when asked the same question more than once for clarification.  HEENT: nl dentition, turbinates, and orophanx. Nl external ear canals without cough reflex NECK :  without JVD/Nodes/TM/ nl carotid upstrokes bilaterally LUNGS: no acc muscle use, clear to A and P bilaterally without cough on insp or exp maneuvers CV:  RRR  no s3 or murmur or increase in P2, no edema  ABD:  soft and nontender with nl excursion in the supine position. No bruits or organomegaly, bowel sounds nl MS:  warm without deformities, calf tenderness, cyanosis or clubbing       Impression & Recommendations:  Problem # 1:  ASTHMA (ICD-493.90) Continues with atypical features.    DDX of  difficult airways managment all start with A and  include Adherence, Ace Inhibitors, Acid Reflux, Active Sinus Disease, Alpha 1 Antitripsin deficiency, Anxiety masquerading as Airways dz,  ABPA,  allergy(esp in young), Aspiration (esp in elderly), Adverse effects of DPI,  Active smokers, plus one B  = Beta blocker use..    In this case Adherence is the biggest issue and starts with  inability to use HFA effectively and also  understand that SABA  treats the symptoms but doesn't get to the underlying problem (inflammation).  I used  the ananology of putting steroid cream on a rash to help explain the meaning of topical therapy and the need to get the drug to the target tissue.   I spent extra time with the patient today explaining optimal mdi  technique.  This improved from  50-75% p coaching,  Acid reflux always a concern in this setting  Active sinus dz excluded by sinus ct already  Allergies in ddx > consider allergy profile next  Anxiety/ deconditioning also in ddx for  her symptoms.  Problem # 2:  DYSPNEA (ICD-786.09)  Not able to reproduce this today with symptoms disproportionate to objective findings suggesting anxiety/ deconditioning   Her updated medication list for this problem includes:    Furosemide 20 Mg Tabs (Furosemide) .Marland Kitchen... Take 1 tablet a day.    Bisoprolol Fumarate 5 Mg Tabs (Bisoprolol fumarate) .Marland Kitchen... 1 tab two times a day    Dulera 100-5 Mcg/act Aero (Mometasone furo-formoterol fum) .Marland Kitchen... 2 puffs first thing  in am and 2 puffs again in pm about 12 hours later    Ventolin Hfa 108 (90 Base) Mcg/act Aers (Albuterol sulfate) .Marland Kitchen... 2 puffs every 4-6 hrs as needed  Orders: Est. Patient Level IV (99214) Pulse Oximetry, Ambulatory ([address])  Medications Added to Medication List This Visit: 1)  Pepcid 20 Mg Tabs (Famotidine) .... Take one by mouth at bedtime as needed 2)  Dulera 100-5 Mcg/act Aero (Mometasone furo-formoterol fum) .... 2 puffs first thing  in am and 2 puffs again in pm about 12 hours later 3)  Fluticasone Propionate 50 Mcg/act Susp (Fluticasone propionate) .Marland Kitchen.. 1 spray each nostril at bedtime as needed 4)  Ventolin Hfa 108 (90 Base) Mcg/act Aers (Albuterol sulfate) .... 2 puffs every 4-6 hrs as needed 5)  Cetirizine Hcl 10 Mg Tabs (Cetirizine hcl) .Marland Kitchen.. 1 at bedtime if needed for itchy sneezing runny nose  Patient Instructions: 1)  Work on inhaler technique:  relax and blow all the way out then take a  nice smooth deep breath back in, triggering the inhaler at same time you start breathing in 2)  Dulera 100 2 puffs first thing  in am and 2 puffs again in pm about 12 hours later and only use the ventolin if needed 3)  Separate the medications in 2 bags, the ones you take no matter what on a scheduled basis vs the ones you only take if you need 4)  Please schedule a follow-up appointment in 6 weeks, sooner if needed with PFT's

## 2010-10-14 NOTE — Progress Notes (Signed)
Summary: Faxed records to [place] at [facility].   Faxed records to [place] at [facility]. LOV, EKG, & STRESS FAX: [phone] PHONE: [phone] [name]  September 14, 2010 11:14 AM

## 2010-10-18 ENCOUNTER — Ambulatory Visit
Admission: RE | Admit: 2010-10-18 | Discharge: 2010-10-18 | Disposition: A | Discharge status: Home / Self Care | Payer: Medicare Other | Source: Ambulatory Visit | Attending: Oncology | Admitting: Oncology

## 2010-10-18 DIAGNOSIS — Z9889 Other specified postprocedural states: Secondary | ICD-10-CM

## 2010-11-03 ENCOUNTER — Ambulatory Visit (INDEPENDENT_AMBULATORY_CARE_PROVIDER_SITE_OTHER): Payer: Medicare Other | Admitting: Psychiatry

## 2010-11-03 DIAGNOSIS — F0630 Mood disorder due to known physiological condition, unspecified: Secondary | ICD-10-CM

## 2010-11-03 DIAGNOSIS — F410 Panic disorder [episodic paroxysmal anxiety] without agoraphobia: Secondary | ICD-10-CM

## 2010-11-19 ENCOUNTER — Encounter: Payer: Self-pay | Admitting: Internal Medicine

## 2010-11-19 ENCOUNTER — Ambulatory Visit (INDEPENDENT_AMBULATORY_CARE_PROVIDER_SITE_OTHER): Payer: Medicare Other | Admitting: Internal Medicine

## 2010-11-19 DIAGNOSIS — J45909 Unspecified asthma, uncomplicated: Secondary | ICD-10-CM

## 2010-11-22 LAB — BASIC METABOLIC PANEL
BUN: 9 mg/dL (ref 6–23)
CO2: 32 mEq/L (ref 19–32)
Calcium: 9.9 mg/dL (ref 8.4–10.5)
GFR calc non Af Amer: >60 mL/min (ref >60)
Glucose, Bld: 99 mg/dL (ref 70–99)
Sodium: 140 mEq/L (ref 135–145)

## 2010-11-22 LAB — CBC
MCH: 29.7 pg (ref 26.0–34.0)
MCV: 90.8 fL (ref 78.0–100.0)
Platelets: 159 10*3/uL (ref 150–400)
RBC: 4.45 MIL/uL (ref 3.87–5.11)

## 2010-11-23 NOTE — Assessment & Plan Note (Signed)
Summary: Pulmonary/ ext ov with hfa 75% p coaching   Primary Provider/Referring Provider:  Dr. Secundino Ginger  CC:  Cough- returned approx 4 days ago.  History of Present Illness: 18 yowm quit smoking around 1985 with no respirartory problems at all.   July 16, 2010  1st pulmonary eval in EMR era for > 2 years sob and refractory cough and wheeze since 05/2010, never previously had any fall allegies.  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 rec Work on inhaler technique:  relax and blow all the way out then take a nice smooth deep breath back in, triggering the inhaler at same time you start breathing in  Flonase  has  no immediate benefit in terms of improving symptoms.  Prilosec 20 mg Take  one 30-60 min before first meal of the day and pecid 20 mg one at bedtime  Please schedule a follow-up appointment in 6 weeks, sooner if needed   Think of your medications in 3 separate categories and keep them separate AND BRING THEM WITH YOU TO [facility] OFFICE VISIT  a)  The ones you take no matter what daily on a scheduled basis these belong in a pillbox but also include flonase  b)   The ones you only take if needed for specific problems c)   The ones you take for a short course and stop, like antibiotics and prednisone. GERD (REFLUX)  diet  October 01, 2010 ov cc cough resolved  but still feels she needs the saba up to 3 times esp with activities like going to the dumpster and back - not clear whether sitting and resting works just as well to relieve sob.  did not bring meds in separate bags as requested. count on dulera is way off > could not tolerated taking it regularly due to nerves    November 19, 2010  ov cc Cough- returned approx 4 days ago using ventolin after lie down helps x hours  but using it up to 4 x daily also.  no excess sputum. sob worse with ex but resolves s needing saba. Pt denies any  significant sore throat, dysphagia, itching, sneezing,  nasal congestion or excess secretions,  fever, chills, sweats, unintended wt loss, pleuritic or exertional cp, hempoptysis,   tolerance  orthopnea pnd or leg swelling  Current Medications (verified): 1)  Furosemide 20 Mg Tabs (Furosemide) .... Take 1 Tablet A Day. 2)  Bisoprolol Fumarate 5 Mg Tabs (Bisoprolol Fumarate) .Marland Kitchen.. 1 Tab Two Times A Day 3)  Diovan 160 Mg Tabs (Valsartan) .Marland Kitchen.. 1 Cap Once Daily 4)  Pravastatin Sodium 20 Mg Tabs (Pravastatin Sodium) .Marland Kitchen.. 1 Tab Once Daily 5)  Vitamin D3 1000 Unit  Tabs (Cholecalciferol) .Marland Kitchen.. 1 Tab Once Daily 6)  Calcium-Carb 600 + D 600-125 Mg-Unit Tabs (Calcium Carbonate-Vitamin D) .Marland Kitchen.. 1 Tab Once Daily 7)  Vitamin C 500 Mg Tabs (Ascorbic Acid) .Marland Kitchen.. 1 Tab Once Daily 8)  Aspirin 81 Mg Tbec (Aspirin) .... Take One Tablet By Mouth Daily 9)  Nitrostat 0.4 Mg Subl (Nitroglycerin) .Marland Kitchen.. 1 Tablet Under Tongue At Onset of Chest Pain; You May Repeat Every 5 Minutes For Up To 3 Doses. 10)  Restasis 0.05 % Emul (Cyclosporine) .Marland Kitchen.. 1 Gtt Ou Two Times A Day 11)  Fluticasone Propionate 50 Mcg/act Susp (Fluticasone Propionate) .Marland Kitchen.. 1 Spray Each Nostril At Bedtime As Needed  12)  Ventolin Hfa 108 (90 Base) Mcg/act Aers (Albuterol Sulfate) .... 2 Puffs Every 4-6 Hrs As Needed 13)  Xanax 1 Mg Tabs (Alprazolam) .... 1/2 - 1 By Mouth Three Times A Day Prn 14)  Cetirizine Hcl 10 Mg Tabs (Cetirizine Hcl) .Marland Kitchen.. 1 At Bedtime If Needed For Itchy Sneezing Runny Nose  Allergies (verified): 1)  ! Penicillin V Potassium (Penicillin V Potassium) 2)  ! Covera-Hs (Verapamil Hcl) 3)  ! Zoloft (Sertraline Hcl) 4)  ! Librax (Clidinium-Chlordiazepoxide) 5)  ! Paxil (Paroxetine Hcl) 6)  ! Sulfadiazine (Sulfadiazine) 7)  ! Cephalexin (Cephalexin) 8)  ! Amoxicillin (Amoxicillin) 9)  ! Bromfenex 10)  ! * Tequin 11)  ! Latex 12)  ! Darvocet 13)  ! 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 To have port-a-cath placed for chemo. Has not started chemo or radiation treatment. 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 Dr Derrell Lolling Onc Dr Donnie Coffin  Vital Signs:  Patient profile:   60 year old female Weight:      172 pounds O2 Sat:      96 % on Room air Temp:     97.7 degrees F oral Pulse rate:   61 / minute BP sitting:   130 / 80  (left arm)  Vitals Entered By: Vernie Murders (November 19, 2010 12:28 PM)  O2 Flow:  Room air  Physical Exam  Additional Exam:  wt  173 August 18, 2010 > 173 October 01, 2010 > 172 November 19, 2010  pseudowheeze resolves completely  with purse lip maneuver   HEENT: nl dentition, turbinates, and orophanx. Nl external ear canals without cough reflex NECK :  without JVD/Nodes/TM/ nl carotid upstrokes bilaterally LUNGS: no acc muscle use, clear to A and P bilaterally without cough on insp or exp maneuvers CV:  RRR  no s3 or murmur or  increase in P2, no edema  ABD:  soft and nontender with nl excursion in the supine position. No bruits or organomegaly, bowel sounds nl MS:  warm without deformities, calf tenderness, cyanosis or clubbing       Impression & Recommendations:  Problem # 1:  ASTHMA (ICD-493.90) Continues with atypical features and nl spirometry despite active symptoms    DDX of  difficult airways managment all start with A and  include Adherence, Ace Inhibitors, Acid Reflux, Active Sinus Disease, Alpha 1 Antitripsin deficiency, Anxiety masquerading as Airways dz,  ABPA,  allergy(esp in young), Aspiration (esp in elderly), Adverse effects of DPI,  Active smokers, plus one B  = Beta blocker  use..    In this case Adherence is the biggest issue and starts with  inability to use HFA effectively and also  understand that SABA treats the symptoms but doesn't get to the underlying problem (inflammation).  I used  the ananology of putting steroid cream on a rash to help explain the meaning of topical therapy and the need to get the drug to the target tissue.   I spent extra time with the patient today explaining optimal mdi  technique.  This improved from  50-75% p coaching Will see if can tolerate one two times a day dosing with symbicort  Acid reflux always a concern in this setting: not better on diet so add PPI and h2 hs  Active sinus dz excluded by sinus ct already  Allergies in ddx > consider allergy profile next ov  Anxiety/ deconditioning also in ddx for  her symptoms.  I discussed the importance of understanding the goals of asthma therapy including the rule of 2's as it applies to the use of a rescue inhaler and hope to see much less saba dependency at next ov  Pt informed: Unlike when you get a prescription for eyeglasses, it's not possible to always walk out of this or any medical office with a perfect prescription that is immediately effective  based on any test that we offer here.  On the contrary, it  may take several weeks for the full impact of changes recommened today - hopefully you will respond well.  If not, then we'll adjust your medication on your next visit accordingly, knowing more then than we can possibly know now.     Medications Added to Medication List This Visit: 1)  Symbicort 160-4.5 Mcg/act Aero (Budesonide-formoterol fumarate) .... One puff every 12 hours 2)  Pepcid 20 Mg Tabs (Famotidine) .... Take one by mouth at bedtime 3)  Protonix 40 Mg Tbec (Pantoprazole sodium) .... By mouth daily. take one half hour before eating.  Other Orders: Est. Patient Level IV (04540) HFA Instruction [phone])  Patient Instructions: 1)  Work on inhaler technique:  relax and blow all the way out then take a nice smooth deep breath back in, triggering the inhaler at same time you start breathing in 2)  Symbicort 160 1 puffs first thing  in am and 1 puffs again in pm about 12 hours later if needed 3)  Protonix 40 mg Take  one 30-60 min before first meal of the day and Pepcid 20 mg one at bedtime 4)  Stop dulera 5)  GERD (REFLUX)  is a common cause of respiratory symptoms. It commonly presents without heartburn and can be treated with medication, but also with lifestyle changes including avoidance of late meals, excessive alcohol, smoking cessation, and avoid fatty foods, chocolate, peppermint, colas, red wine, and acidic juices such as orange juice. NO MINT OR MENTHOL PRODUCTS SO NO COUGH DROPS  6)  USE SUGARLESS CANDY INSTEAD (jolley ranchers)  7)  NO OIL BASED VITAMINS  8)  Please schedule a follow-up appointment in 6 weeks, sooner if needed  Prescriptions: PROTONIX 40 MG  TBEC (PANTOPRAZOLE SODIUM) By mouth daily. Take one half hour before eating.  #34 x 11   Entered and Authorized by:   Nyoka Cowden MD   Signed by:   Nyoka Cowden MD on 11/19/2010   Method used:   Electronically to        [facility] [phone]* (retail)  [address]       [place], [place]  [address]        Ph: [phone] or [phone]       Fax: [phone]   RxID:   [id number]

## 2010-11-24 LAB — COMPREHENSIVE METABOLIC PANEL
ALT: 17 U/L (ref 0–35)
AST: 19 U/L (ref 0–37)
Alkaline Phosphatase: 56 U/L (ref 39–117)
CO2: 28 mEq/L (ref 19–32)
GFR calc Af Amer: >60 mL/min (ref >60)
GFR calc non Af Amer: >60 mL/min (ref >60)
Glucose, Bld: 122 mg/dL — ABNORMAL HIGH (ref 70–99)
Potassium: 3.3 mEq/L — ABNORMAL LOW (ref 3.5–5.1)
Sodium: 141 mEq/L (ref 135–145)
Total Protein: 6.1 g/dL (ref 6.0–8.3)

## 2010-11-24 LAB — CBC
MCH: 28.9 pg (ref 26.0–34.0)
MCHC: 32.1 g/dL (ref 30.0–36.0)
MCV: 90.2 fL (ref 78.0–100.0)
Platelets: 171 10*3/uL (ref 150–400)
RDW: 13.7 % (ref 11.5–15.5)
WBC: 4.5 10*3/uL (ref 4.0–10.5)

## 2010-11-24 LAB — HEMOGLOBIN A1C: Hgb A1c MFr Bld: 5.9 % — ABNORMAL HIGH (ref <5.7)

## 2010-11-24 LAB — BASIC METABOLIC PANEL
BUN: 14 mg/dL (ref 6–23)
Chloride: 109 mEq/L (ref 96–112)
Creatinine, Ser: 0.75 mg/dL (ref 0.4–1.2)
Glucose, Bld: 109 mg/dL — ABNORMAL HIGH (ref 70–99)

## 2010-11-24 LAB — CK TOTAL AND CKMB (NOT AT ARMC): CK, MB: 2.2 ng/mL (ref 0.3–4.0)

## 2010-11-24 LAB — POCT CARDIAC MARKERS
CKMB, poc: <1 ng/mL — ABNORMAL LOW (ref 1.0–8.0)
Myoglobin, poc: 37.6 ng/mL (ref 12–200)
Troponin i, poc: <0.05 ng/mL (ref 0.00–0.09)
Troponin i, poc: <0.05 ng/mL (ref 0.00–0.09)

## 2010-11-24 LAB — CARDIAC PANEL(CRET KIN+CKTOT+MB+TROPI)
CK, MB: 1.9 ng/mL (ref 0.3–4.0)
Total CK: 78 U/L (ref 7–177)
Troponin I: <0.01 ng/mL (ref 0.00–0.06)

## 2010-11-24 LAB — DIFFERENTIAL
Eosinophils Absolute: 0 10*3/uL (ref 0.0–0.7)
Eosinophils Relative: 1 % (ref 0–5)
Lymphs Abs: 1 10*3/uL (ref 0.7–4.0)
Monocytes Absolute: 0.4 10*3/uL (ref 0.1–1.0)
Monocytes Relative: 9 % (ref 3–12)

## 2010-11-24 LAB — LIPID PANEL
Cholesterol: 137 mg/dL (ref 0–200)
HDL: 51 mg/dL (ref >39)
Total CHOL/HDL Ratio: 2.7 RATIO

## 2010-11-24 LAB — D-DIMER, QUANTITATIVE: D-Dimer, Quant: <0.22 ug/mL-FEU (ref 0.00–0.48)

## 2010-11-25 ENCOUNTER — Other Ambulatory Visit: Payer: Self-pay | Admitting: Oncology

## 2010-11-25 ENCOUNTER — Encounter (HOSPITAL_BASED_OUTPATIENT_CLINIC_OR_DEPARTMENT_OTHER): Payer: Medicare Other | Admitting: Oncology

## 2010-11-25 DIAGNOSIS — Z170 Estrogen receptor positive status [ER+]: Secondary | ICD-10-CM

## 2010-11-25 DIAGNOSIS — C50419 Malignant neoplasm of upper-outer quadrant of unspecified female breast: Secondary | ICD-10-CM

## 2010-11-25 LAB — COMPREHENSIVE METABOLIC PANEL
Albumin: 4.1 g/dL (ref 3.5–5.2)
Alkaline Phosphatase: 86 U/L (ref 39–117)
BUN: 16 mg/dL (ref 6–23)
CO2: 31 mEq/L (ref 19–32)
Calcium: 9.9 mg/dL (ref 8.4–10.5)
Chloride: 103 mEq/L (ref 96–112)
Glucose, Bld: 102 mg/dL — ABNORMAL HIGH (ref 70–99)
Potassium: 3.8 mEq/L (ref 3.5–5.3)
Sodium: 141 mEq/L (ref 135–145)
Total Protein: 7 g/dL (ref 6.0–8.3)

## 2010-11-25 LAB — CBC WITH DIFFERENTIAL/PLATELET
Basophils Absolute: 0 10*3/uL (ref 0.0–0.1)
Eosinophils Absolute: 0.1 10*3/uL (ref 0.0–0.5)
HGB: 13.6 g/dL (ref 11.6–15.9)
MCV: 90.4 fL (ref 79.5–101.0)
MONO#: 0.3 10*3/uL (ref 0.1–0.9)
MONO%: 7.8 % (ref 0.0–14.0)
NEUT#: 2.3 10*3/uL (ref 1.5–6.5)
RBC: 4.47 10*6/uL (ref 3.70–5.45)
RDW: 13.8 % (ref 11.2–14.5)
WBC: 3.8 10*3/uL — ABNORMAL LOW (ref 3.9–10.3)
lymph#: 1.1 10*3/uL (ref 0.9–3.3)

## 2010-11-25 LAB — LACTATE DEHYDROGENASE: LDH: 140 U/L (ref 94–250)

## 2010-11-26 LAB — CANCER ANTIGEN 27.29: CA 27.29: 35 U/mL (ref 0–39)

## 2010-11-26 LAB — VITAMIN D 25 HYDROXY (VIT D DEFICIENCY, FRACTURES): Vit D, 25-Hydroxy: 35 ng/mL (ref 30–89)

## 2010-12-01 LAB — CREATININE, SERUM: GFR calc non Af Amer: 53 mL/min — ABNORMAL LOW (ref >60)

## 2010-12-02 ENCOUNTER — Ambulatory Visit (INDEPENDENT_AMBULATORY_CARE_PROVIDER_SITE_OTHER): Payer: Medicare Other | Admitting: Psychiatry

## 2010-12-02 ENCOUNTER — Encounter: Payer: Medicare Other | Admitting: Oncology

## 2010-12-02 DIAGNOSIS — F0630 Mood disorder due to known physiological condition, unspecified: Secondary | ICD-10-CM

## 2010-12-02 DIAGNOSIS — F410 Panic disorder [episodic paroxysmal anxiety] without agoraphobia: Secondary | ICD-10-CM

## 2010-12-20 LAB — DIFFERENTIAL
Basophils Absolute: 0 10*3/uL (ref 0.0–0.1)
Basophils Relative: 1 % (ref 0–1)
Eosinophils Relative: 1 % (ref 0–5)
Lymphocytes Relative: 18 % (ref 12–46)
Monocytes Absolute: 0.3 10*3/uL (ref 0.1–1.0)
Monocytes Relative: 7 % (ref 3–12)

## 2010-12-20 LAB — COMPREHENSIVE METABOLIC PANEL
ALT: 15 U/L (ref 0–35)
AST: 21 U/L (ref 0–37)
Alkaline Phosphatase: 72 U/L (ref 39–117)
CO2: 29 mEq/L (ref 19–32)
Calcium: 9.3 mg/dL (ref 8.4–10.5)
Chloride: 110 mEq/L (ref 96–112)
GFR calc Af Amer: >60 mL/min (ref >60)
GFR calc non Af Amer: >60 mL/min (ref >60)
Glucose, Bld: 107 mg/dL — ABNORMAL HIGH (ref 70–99)
Potassium: 4.3 mEq/L (ref 3.5–5.1)
Sodium: 142 mEq/L (ref 135–145)
Total Bilirubin: 1 mg/dL (ref 0.3–1.2)

## 2010-12-20 LAB — CBC
HCT: 38.8 % (ref 36.0–46.0)
Hemoglobin: 12 g/dL (ref 12.0–15.0)
Hemoglobin: 12.3 g/dL (ref 12.0–15.0)
Hemoglobin: 13.4 g/dL (ref 12.0–15.0)
MCHC: 33.3 g/dL (ref 30.0–36.0)
MCHC: 33.5 g/dL (ref 30.0–36.0)
MCHC: 34.4 g/dL (ref 30.0–36.0)
MCHC: 34.5 g/dL (ref 30.0–36.0)
RBC: 3.76 MIL/uL — ABNORMAL LOW (ref 3.87–5.11)
RBC: 3.88 MIL/uL (ref 3.87–5.11)
RBC: 3.92 MIL/uL (ref 3.87–5.11)
RBC: 4.21 MIL/uL (ref 3.87–5.11)
RDW: 13.2 % (ref 11.5–15.5)
WBC: 4 10*3/uL (ref 4.0–10.5)
WBC: 4.9 10*3/uL (ref 4.0–10.5)

## 2010-12-20 LAB — POCT I-STAT, CHEM 8
BUN: 13 mg/dL (ref 6–23)
Calcium, Ion: 1.3 mmol/L (ref 1.12–1.32)
Glucose, Bld: 113 mg/dL — ABNORMAL HIGH (ref 70–99)
TCO2: 29 mmol/L (ref 0–100)

## 2010-12-20 LAB — BASIC METABOLIC PANEL
CO2: 28 mEq/L (ref 19–32)
Calcium: 9.3 mg/dL (ref 8.4–10.5)
Calcium: 9.6 mg/dL (ref 8.4–10.5)
Creatinine, Ser: 1.03 mg/dL (ref 0.4–1.2)
GFR calc Af Amer: >60 mL/min (ref >60)
Sodium: 141 mEq/L (ref 135–145)
Sodium: 142 mEq/L (ref 135–145)

## 2010-12-20 LAB — TSH: TSH: 1.967 u[IU]/mL (ref 0.350–4.500)

## 2010-12-20 LAB — GRAM STAIN

## 2010-12-20 LAB — URINALYSIS, ROUTINE W REFLEX MICROSCOPIC
Bilirubin Urine: NEGATIVE
Glucose, UA: NEGATIVE mg/dL
Ketones, ur: NEGATIVE mg/dL
Nitrite: NEGATIVE
Specific Gravity, Urine: 1.002 — ABNORMAL LOW (ref 1.005–1.030)
pH: 6 (ref 5.0–8.0)

## 2010-12-20 LAB — PROTIME-INR: INR: 1 (ref 0.00–1.49)

## 2010-12-20 LAB — CSF CELL COUNT WITH DIFFERENTIAL
RBC Count, CSF: 3 /mm3 — ABNORMAL HIGH
WBC, CSF: 1 /mm3 (ref 0–5)

## 2010-12-20 LAB — VITAMIN B12: Vitamin B-12: 278 pg/mL (ref 211–911)

## 2010-12-20 LAB — HSV PCR: HSV, PCR: NOT DETECTED

## 2010-12-20 LAB — ALBUMIN, FLUID (OTHER): Albumin, Fluid: 0 g/dL

## 2010-12-20 LAB — T4, FREE: Free T4: 1.16 ng/dL (ref 0.80–1.80)

## 2010-12-20 LAB — T4: T4, Total: 8.9 ug/dL (ref 5.0–12.5)

## 2010-12-20 LAB — MAGNESIUM: Magnesium: 1.9 mg/dL (ref 1.5–2.5)

## 2010-12-23 ENCOUNTER — Ambulatory Visit (INDEPENDENT_AMBULATORY_CARE_PROVIDER_SITE_OTHER): Payer: Medicare Other | Admitting: Psychiatry

## 2010-12-23 DIAGNOSIS — F0630 Mood disorder due to known physiological condition, unspecified: Secondary | ICD-10-CM

## 2010-12-23 DIAGNOSIS — F410 Panic disorder [episodic paroxysmal anxiety] without agoraphobia: Secondary | ICD-10-CM

## 2010-12-27 LAB — GLUCOSE, CAPILLARY: Glucose-Capillary: 117 mg/dL — ABNORMAL HIGH (ref 70–99)

## 2010-12-29 ENCOUNTER — Encounter: Payer: Self-pay | Admitting: Internal Medicine

## 2010-12-31 ENCOUNTER — Ambulatory Visit (INDEPENDENT_AMBULATORY_CARE_PROVIDER_SITE_OTHER): Payer: Medicare Other | Admitting: Internal Medicine

## 2010-12-31 ENCOUNTER — Encounter: Payer: Self-pay | Admitting: Internal Medicine

## 2010-12-31 DIAGNOSIS — I10 Essential (primary) hypertension: Secondary | ICD-10-CM

## 2010-12-31 DIAGNOSIS — K219 Gastro-esophageal reflux disease without esophagitis: Secondary | ICD-10-CM

## 2010-12-31 DIAGNOSIS — J45909 Unspecified asthma, uncomplicated: Secondary | ICD-10-CM

## 2010-12-31 NOTE — Assessment & Plan Note (Signed)
All goals of chronic asthma control met including optimal function and elimination of symptoms with minimal need for rescue therapy.  Contingencies discussed in full including contacting this office immediately if not controlling the symptoms using the rule of two's.     The proper method of use, as well as anticipated side effects, of this metered-dose inhaler are discussed and demonstrated to the patient - this improved to about 75% with coaching

## 2010-12-31 NOTE — Progress Notes (Signed)
Subjective:    Patient ID: [name], female    DOB: [birth year], 60 y.o.   MRN: [id number]  HPI  Primary Provider/Referring Provider: Dr. Secundino Ginger / [facility] Internal medicine    59 yowm quit smoking around 1985 with no respirartory problems at all.   July 16, 2010 1st pulmonary eval in EMR cc sob and refractory cough and wheeze since 05/2010, never previously had any fall allergies. See inpt eval with imp pseudoasthma, rec d/c ACE and and Inderal   August 18, 2010 ov cough much better off ace, no need for saba at all now, still not satisfied throat irritation better. rec Work on inhaler technique Flonase has no immediate benefit in terms of improving symptoms.  Prilosec 20 mg Take one 30-60 min before first meal of the day and pecid 20 mg one at bedtime  Please schedule a follow-up appointment in 6 weeks, sooner if needed  Think of your medications in 3 separate categories and keep them separate AND BRING THEM WITH YOU TO [facility] OFFICE VISIT  a) The ones you take no matter what daily on a scheduled basis these belong in a pillbox but also include flonase  b) The ones you only take if needed for specific problems  c) The ones you take for a short course and stop, like antibiotics and prednisone.  GERD (REFLUX) diet   October 01, 2010 ov cc cough resolved but still feels she needs the saba up to 3 times esp with activities like going to the dumpster and back - not clear whether sitting and resting works just as well to relieve sob. did not bring meds in separate bags as requested. count on dulera is way off > could not tolerate taking it regularly due to nerves. rec no change in rx  November 19, 2010 ov cc Cough- returned approx 4 days ago using ventolin after lie down helps x hours but using it up to 4 x daily also. no excess sputum. sob worse with ex but resolves s needing saba. rec 1) Work on inhaler technique: relax and blow all the way out then take a nice smooth deep breath back in,  triggering the inhaler at same time you start breathing in  2) Symbicort 160 1 puffs first thing in am and 1 puffs again in pm about 12 hours later if needed  3) Protonix 40 mg Take one 30-60 min before first meal of the day and Pepcid 20 mg one at bedtime  4) Stop dulera  5) GERD (REFLUX) diet  12/31/2010 ov /[name]/ final summary:  All smiles, no cough, sob,  rarely needs symbicort more than once or twice a week and no saba at all.    Pt denies any significant sore throat, dysphagia, itching, sneezing,  nasal congestion or excess/ purulent secretions,  fever, chills, sweats, unintended wt loss, pleuritic or exertional cp, hempoptysis, orthopnea pnd or leg swelling.    Also denies any obvious fluctuation of symptoms with weather or environmental changes or other aggravating or alleviating factors.      Allergies   1) ! Penicillin V Potassium (Penicillin V Potassium)  2) ! Covera-Hs (Verapamil Hcl)  3) ! Zoloft (Sertraline Hcl)  4) ! Librax (Clidinium-Chlordiazepoxide)  5) ! Paxil (Paroxetine Hcl)  6) ! Sulfadiazine (Sulfadiazine)  7) ! Cephalexin (Cephalexin)  8) ! Amoxicillin (Amoxicillin)  9) ! Bromfenex  10) ! * Tequin  11) ! Latex  12) ! Darvocet  13) ! 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 To have port-a-cath placed for chemo. Has not started chemo or radiation treatment.  DEPRESSION (ICD-311)bipolar (?)  VIRAL INFECTION (ICD-079.99)  ADENOCARCINOMA, RIGHT BREAST (ICD-174.9)  DIVERTICULOSIS, COLON (ICD-562.10)   CONSTIPATION (ICD-564.00)  GASTROESOPHAGEAL REFLUX DISEASE (ICD-530.81)  BIPOLAR NOS (ICD-296.80)  LOW BACK PAIN (ICD-724.2)  ANXIETY (ICD-300.00)               Review of Systems     Objective:   Physical Exam     wt 173 August 18, 2010 > 173 October 01, 2010 > 172 November 19, 2010 >  174 12/31/2010  pseudowheeze resolves completely with purse lip maneuver  HEENT: nl dentition, turbinates, and orophanx. Nl external ear canals without cough reflex  NECK : without JVD/Nodes/TM/ nl carotid upstrokes bilaterally  LUNGS: no acc muscle use, clear to A and P bilaterally without cough on insp or exp maneuvers  CV: RRR no s3 or murmur or increase in P2, no edema  ABD: soft and nontender with nl excursion in the supine position. No bruits or organomegaly, bowel sounds nl  MS: warm without deformities, calf tenderness, cyanosis or clubbing     Assessment & Plan:

## 2010-12-31 NOTE — Assessment & Plan Note (Signed)
Cough was probably  Classic Upper airway cough syndrome, so named because it's frequently impossible to sort out how much is  CR/sinusitis with freq throat clearing (which can be related to primary GERD)   vs  causing  secondary (" extra esophageal")  GERD from wide swings in gastric pressure that occur with throat clearing, often  promoting self use of mint and menthol lozenges that reduce the lower esophageal sphincter tone and exacerbate the problem further in a cyclical fashion.   These are the same pts who not infrequently have failed to tolerate ace inhibitors,  dry powder inhalers or biphosphonates or report having reflux symptoms that don't respond to standard doses of PPI , and are easily confused as having aecopd or asthma flares,   Now that off ace try taper off acid suppression and if flares will need GI eval next

## 2010-12-31 NOTE — Patient Instructions (Addendum)
Only symbicort 160 if needed up to  1-2 puffs every 12 hours and if you use the max dose and are not happy call immediately for evaluation.    Stop pepcid first and if no increase in cough then stop protonix 2 weeks later and if resp symptoms flare you need to restart them  GERD (REFLUX)  is an extremely common cause of respiratory symptoms, many times with no significant heartburn at all.    It can be treated with medication, but also with lifestyle changes including avoidance of late meals, excessive alcohol, smoking cessation, and avoid fatty foods, chocolate, peppermint, colas, red wine, and acidic juices such as orange juice.  NO MINT OR MENTHOL PRODUCTS SO NO COUGH DROPS  USE SUGARLESS CANDY INSTEAD (jolley ranchers or Stover's)  NO OIL BASED VITAMINS

## 2011-01-06 ENCOUNTER — Ambulatory Visit (INDEPENDENT_AMBULATORY_CARE_PROVIDER_SITE_OTHER): Payer: Medicare Other | Admitting: Psychiatry

## 2011-01-06 DIAGNOSIS — F410 Panic disorder [episodic paroxysmal anxiety] without agoraphobia: Secondary | ICD-10-CM

## 2011-01-06 DIAGNOSIS — F0630 Mood disorder due to known physiological condition, unspecified: Secondary | ICD-10-CM

## 2011-01-17 ENCOUNTER — Encounter (INDEPENDENT_AMBULATORY_CARE_PROVIDER_SITE_OTHER): Payer: Self-pay | Admitting: General Surgery

## 2011-01-20 ENCOUNTER — Encounter (HOSPITAL_BASED_OUTPATIENT_CLINIC_OR_DEPARTMENT_OTHER)
Admission: RE | Admit: 2011-01-20 | Discharge: 2011-01-20 | Disposition: A | Discharge status: Home / Self Care | Payer: Medicare Other | Source: Ambulatory Visit | Attending: Specialist | Admitting: Specialist

## 2011-01-20 LAB — DIFFERENTIAL
Basophils Absolute: 0 10*3/uL (ref 0.0–0.1)
Basophils Relative: 1 % (ref 0–1)
Eosinophils Absolute: 0 10*3/uL (ref 0.0–0.7)
Monocytes Relative: 7 % (ref 3–12)
Neutrophils Relative %: 61 % (ref 43–77)

## 2011-01-20 LAB — BASIC METABOLIC PANEL
CO2: 31 mEq/L (ref 19–32)
GFR calc Af Amer: >60 mL/min (ref >60)
GFR calc non Af Amer: >60 mL/min (ref >60)
Glucose, Bld: 107 mg/dL — ABNORMAL HIGH (ref 70–99)
Potassium: 3.6 mEq/L (ref 3.5–5.1)
Sodium: 143 mEq/L (ref 135–145)

## 2011-01-20 LAB — CBC
HCT: 38.7 % (ref 36.0–46.0)
Hemoglobin: 13 g/dL (ref 12.0–15.0)
RDW: 13.2 % (ref 11.5–15.5)
WBC: 3.7 10*3/uL — ABNORMAL LOW (ref 4.0–10.5)

## 2011-01-24 ENCOUNTER — Other Ambulatory Visit: Payer: Self-pay | Admitting: Specialist

## 2011-01-24 ENCOUNTER — Ambulatory Visit (HOSPITAL_BASED_OUTPATIENT_CLINIC_OR_DEPARTMENT_OTHER)
Admission: RE | Admit: 2011-01-24 | Discharge: 2011-01-25 | Disposition: A | Discharge status: Home / Self Care | Payer: Medicare Other | Source: Ambulatory Visit | Attending: Specialist | Admitting: Specialist

## 2011-01-24 DIAGNOSIS — N62 Hypertrophy of breast: Secondary | ICD-10-CM | POA: Insufficient documentation

## 2011-01-24 DIAGNOSIS — K589 Irritable bowel syndrome without diarrhea: Secondary | ICD-10-CM | POA: Insufficient documentation

## 2011-01-24 DIAGNOSIS — F411 Generalized anxiety disorder: Secondary | ICD-10-CM | POA: Insufficient documentation

## 2011-01-24 DIAGNOSIS — Z853 Personal history of malignant neoplasm of breast: Secondary | ICD-10-CM | POA: Insufficient documentation

## 2011-01-24 DIAGNOSIS — F410 Panic disorder [episodic paroxysmal anxiety] without agoraphobia: Secondary | ICD-10-CM | POA: Insufficient documentation

## 2011-01-24 DIAGNOSIS — J45909 Unspecified asthma, uncomplicated: Secondary | ICD-10-CM | POA: Insufficient documentation

## 2011-01-24 DIAGNOSIS — Z79899 Other long term (current) drug therapy: Secondary | ICD-10-CM | POA: Insufficient documentation

## 2011-01-24 DIAGNOSIS — Z01812 Encounter for preprocedural laboratory examination: Secondary | ICD-10-CM | POA: Insufficient documentation

## 2011-01-24 DIAGNOSIS — K219 Gastro-esophageal reflux disease without esophagitis: Secondary | ICD-10-CM | POA: Insufficient documentation

## 2011-01-25 NOTE — Discharge Summary (Signed)
[name], [name]                ACCOUNT NO.:  [id number]   MEDICAL RECORD NO.:  [id number]          PATIENT TYPE:  INP   LOCATION:  1318                         FACILITY:  [facility]   PHYSICIAN:  Linda Paling, MD    DATE OF BIRTH:  July 21, 1951   DATE OF ADMISSION:  03/02/2009  DATE OF DISCHARGE:  03/06/2009                               DISCHARGE SUMMARY   PRIMARY CARE PHYSICIAN:  Dr. [place] Terasaki, [place].   HEMATOLOGIST/ONCOLOGIST:  Dr. Donnie Coffin.   ADMITTING HISTORY:  Please refer to the excellent admission note  dictated by Dr. Vania Rea under history of present illness.   DISCHARGE DIAGNOSIS:  1. Back pain and bilateral lower extremity weakness due to      degenerative disk disease, C3-C4, C6-C7.   SECONDARY DIAGNOSES:  1. History of breast cancer.  2. History of hypertension.  3. History of CAD.  4. History of depression.  5. History of sinusitis.  6. History of asthma.   DISCHARGE MEDICATIONS:  1. New medication added Percocet 5/325 mg daily 6 hours p.r.n.   HOME MEDICATIONS TO BE CONTINUED:  1. Advair discus 250/50 one puff twice a day.  2. Albuterol inhaler 2 puffs p.r.n. typically twice a week.  3. HCTZ 25 mg daily.  4. Lisinopril 40 mg daily.  5. Inderal LA 160 mg daily.  6. Xanax 1 mg twice daily as needed.  7. Aspirin 81 mg p.o. daily.  8. Pravachol 40 mg p.o. q.h.s.  9. Extra strength Tylenol p.r.n.   HOSPITAL COURSE:  The following issues were addressed during the  hospitalization.  1. Back pain.  The patient was admitted to the hospital.  Neurology      consult was obtained.  The patient underwent MRI scan which showed      degenerative disk disease at the cervical level.  The patient also      underwent CSF evaluation which showed mild elevation in protein,      otherwise, it was negative.  Neurology consultation was performed      by Dr. Delia Heady on March 03, 2009.  The patient's back pain      continued to get better.  At the time  of discharge, the patient's      back pain is somewhat better.  Physical therapy evaluation is      performed who have recommended home PT/OT.  Neurology has signed      off today.  Patient is hemodynamically stable to go home.  2. History of hypertension.  It stayed stable.  3. History of depression, stayed stable.  4. History of asthma.  She did not have any acute exacerbation.  5. Imaging performed.  Consultation performed and procedure performed      as per hospital course.   DISPOSITION:  The patient will follow up with Dr. Secundino Ginger at [facility] within one week time and with Dr. Donnie Coffin as on as-needed basis.  Total time spent in discharge with this patient around one hour 15  minutes.      Linda Paling, MD  Electronically Signed  NP/MEDQ  D:  03/06/2009  T:  03/06/2009  Job:  [id number]   cc:   Dr. Kennon Holter, MD  Fax: [phone]

## 2011-01-25 NOTE — Assessment & Plan Note (Signed)
[facility]                            CARDIOLOGY OFFICE NOTE   DIXIE, COPPA                       MRN:          [id number]  DATE:12/26/2007                            DOB:          Jun 24, 1951    [name] returns today for further management of her nonobstructive  coronary disease.  She has undergone a significant round of chemotherapy  since I saw her for surgical clearance in January 2009 for right breast  cancer.   At that time we had a stress Myoview which showed an EF of 73% with no  ischemia.  An echocardiogram obtained in January 2009 showed normal left  ventricular function as well with an EF of 60%.  She is having no  symptoms of orthopnea, PND, or peripheral edema.  She has had no angina.  She does note that her heart rate gets up about 90 when she is having  chemo.   MEDS:  1. HCTZ 25 mg a day.  2. Lisinopril 40 mg a day.  3. Propranolol 160 mg a day.  4. Pravastatin 40 mg a day.  5. Coumadin/benazepril 40 mg a day.   PHYSICAL EXAMINATION:  Her blood pressure today is 122/84, pulse 80 and  regular.  Weight is 169.  HEENT:  Normal.  Carotid upstrokes were equal bilaterally without bruits.  No JVD.  Thyroid is not enlarged.  LUNGS:  Clear to auscultation.  HEART:  Reveals a difficult to appreciate PMI.  She has a normal S1-S2.  There is no obvious gallop.  She has a regular rate and rhythm.  ABDOMINAL EXAM:  Soft, good bowel sounds.  EXTREMITIES:  Reveal no edema.  Pulses are present.  NEURO:  Exam is intact.  SKIN:  Unremarkable.   I am delighted that [name] has tolerated her chemotherapy, thus  far, well.  She is going to have another round over the next several  months.  Her EF is normal both by echo and nuclear stress with no  evidence of ischemia--both studies done in January.   She will return in 3 months for a 2-D echocardiogram once she has  finished her chemotherapeutic course.    Thomas C. Daleen Squibb, MD,  [facility]  Electronically Signed   TCW/MedQ  DD: 12/26/2007  DT: 12/26/2007  Job #: [id number]   cc:   Pierce Crane, MD

## 2011-01-25 NOTE — H&P (Signed)
[name], Linda NO.:  [id number]   MEDICAL RECORD NO.:  [id number]          [name] TYPE:  INP   LOCATION:  1318                         FACILITY:  [facility]   PHYSICIAN:  Vania Rea, M.D. DATE OF BIRTH:  07/18/1951   DATE OF ADMISSION:  03/02/2009  DATE OF DISCHARGE:                              HISTORY & PHYSICAL   PRIMARY CARE PHYSICIAN:  Dr. [place] Terasaki in [place].   HEMATOLOGIST/ONCOLOGIST:  Dr. Donnie Coffin.   CHIEF COMPLAINT:  Urinary incontinence and inability to walk since  midday today.   HISTORY OF PRESENT ILLNESS:  This is a 60 year old Caucasian lady with a  past history is significant for carcinoma of the right breast status  post chemotherapy followed by partial right mastectomy in August 2009  who has been otherwise in fair state of health able to walk a mile every  day up to about 2 days ago.  The [name] really has been having problems  with her lower back since 4 months ago.  She has been having severe  lower back pains radiating down predominant the left leg.  She was  referred to a spinal surgeon, Dr. Phoebe Perch for this, and has been treated  with physical therapy.  The [name] did have imaging of her lumbar,  thoracic and cervical spine in December 2009 which showed only mild  degenerative changes in the lumbar spine, no evidence of metastasis.  Scattered nonspecific bone lesions in the thoracic spine of unclear  significance.  No metastatic disease in the cervical spine.  The [name]  had a CAT scan done on September 29, 2008 and this showed no specific  areas of metastasis in the spine.  However, they recommended a bone scan  to further evaluate the thoracic lesion.  It is not clear that the bone  scan is yet done.  Additionally, PET scan showed a small focus of  activity in the left gluteus medius muscle inferiorly and muscle  metastasis could not be excluded for that reading.  The [name] had an  MRI of the brain on June 17  with and without contrast because of  persistent right sided headaches and there was no evidence of acute  process on that MRI.   After firing the spinal surgeon, Dr. Phoebe Perch after one month of therapy,  because she felt she was getting worse not better, the [name] has been  self-medicating with extra-strength Tylenol and has been subsisting with  the pain until 2 days ago when the pain seems to be getting worse,  particularly down her left leg, and she started having tingling in the  fingers of both hands.  Then at about midday today, the [name]  apparently suddenly became ataxic, unable to walk and has been  incontinent of urine.  The [name] also notes she has been having  increased urinary frequency for about 2 days.  The [name] was brought  to the emergency room, had a Foley catheter placed and had an MRI of the  lumbar spine without contrast.  The MRI was negative for metastatic  disease and showed only mild lumbar  spondylosis.  At L4-L5 there is disk  bulging and annular care, eccentric to the left mild central canal and  mild to moderate bilateral foraminal narrowing, unchanged from previous.   The [name] was reevaluated by the emergency room physician and was  noted that she was now able to move her legs again.  The Foley catheter  was removed and she was able to call for the bed pan to pass urine, and  she was being prepared to be discharged home, but after standing the  [name] up to walk her, she was ataxic, unable to support herself.  The  hospitalist service was called to assist with management.  The [name]  denies any fever, cough or cold.  She denies any chest pain or shortness  of breath.  She denies any pain in the neck or thoracic area of the  lumbar pain and radicular left leg pain are aggravated by movement.  The  tingling in her fingers do not seem to be precipitated by anything.  She  does suffer with chronic insomnia, but does not consider herself  depressed.   She sleeps best in the early night and then is awake most of  the time after walking.   She does take HCTZ regularly for high blood pressure.  She does not take  a potassium supplement.   PAST MEDICAL HISTORY:  1. Breast cancer as noted above, status post chemotherapy and surgery.  2. Hypertension.  3. Coronary artery disease status post MI and stents x2 in 2003.  4. History of depression.  5. History of sinusitis.  6. Asthma since receiving chemotherapy.   SURGICAL HISTORY:  1. Hysterectomy.  2. Cholecystectomy.  3. Coronary artery stenting.  4. Right partial mastectomy.   MEDICATIONS:  1. Advair discus 250/50 one puff twice daily.  2. Albuterol inhaler two puffs p.r.n. typically twice weekly.  3. HCTZ 25 mg daily.  4. Lisinopril 40 mg daily.  5. Inderal LA 160 mg daily.  6. Xanax 1 mg twice daily as needed.  7. Aspirin 81 mg daily.  8. Pravachol 40 mg at bedtime.  9. Extra strength Tylenol p.r.n.   ALLERGIES:  1. ALL OF THE FOLLOWING CAUSES DIFFICULTY WITH BREATHING:  PENICILLIN,      SULFA, PROPHYLACTIC EYE DROPS, CEPHALEXIN, CLINDAMYCIN, COVERA      Q.H.S. WHICH IS VERAPAMIL, LIBRAX, PAXIL.  2. PAPER TAPE CAUSES A LOCAL RASH.  3. THE [name] RECEIVED TORADOL WHILE IN THE EMERGENCY ROOM AND THIS      CAUSED EXTREME NAUSEA.   SOCIAL HISTORY:  No history of tobacco, alcohol or illicit drug use.  She used to work as a Geophysicist/field seismologist, but because of her  cancer and chemotherapy, she has been advised to avoid exposure to young  children.  The [name] currently resides with her significant other who  is [employer].  She has four grown children and several  grandchildren.   FAMILY HISTORY:  Significant for father who died of pancreatic cancer.  Mother with paroxysmal atrial fibrillation, assist with mitral valve  prolapse.   REVIEW OF SYSTEMS:  On a 10-point review of systems, other than noted  above, unremarkable.   PHYSICAL EXAMINATION:  GENERAL:   Somewhat anxious middle-aged Caucasian  lady sitting up in the stretcher, somewhat nauseous from receiving  Toradol.  VITALS:  Temperature is 97.2, pulse 72, respiration 18, blood pressure  149/97.  She is saturating 100% on 2 liters.  HEENT:  Pupils are round, equal and reactive.  Mucous membranes pink and  anicteric.  No cervical lymphadenopathy or thyromegaly.  No jugular  venous distention.  No carotid bruit.  CHEST:  Clear to auscultation bilaterally.  CARDIOVASCULAR:  Regular rhythm without murmur.  ABDOMEN:  Obese, soft and nontender.  There are no masses.  Bladder is  not distended.  EXTREMITIES:  Without edema.  There are no bony joint deformities noted.  Dorsalis pulses is 2+ bilaterally.  CENTRAL NERVOUS SYSTEM:  Cranial nerves II-XII are grossly intact.  The  [name] has impaired sensation of the entire left side of her body up to  her neck, but not including her face.  She has been tested with gauze.  She says the sensation on the left side feels very rough and strange  compared to the right side.  Power is full strength in both upper  extremities bilaterally, and she has good power in both lower  extremities bilaterally.  Able to flex the knees at the ankles and the  toes against resistance as well as extend against resistance  bilaterally.  However, the movement on the left side triggered  excruciating back pain, and the exam had to be aborted.  Babinski was  downgoing bilaterally.  Deep tendon reflexes of the knees seem to be  increased on the left compared to the right or diminished on the right  compared to the left.  Her skin is warm and dry but she did have  erythematous rash under her left breast, suspicious for Candida.   LABORATORY DATA:  CBC is reviewed and is completely normal.  White count  is 4.8, hemoglobin 13.4, platelets 168, had a normal differential.  Her  MCV is 92.2.  Her I-stat is reviewed and is significant for potassium of  3.2, glucose of 113,  otherwise unremarkable.  Urinalysis:  Specific  gravity was 1.002 which was extremely dilute, and was otherwise  unremarkable.  Her PT/PTT and INR were completely normal.  MRI of her L-  spine as noted above was negative for metastatic disease.  No change in  the mild lumbar spondylosis noted in December 2009.  Transitional  anatomy at the lumbar sacral junction noted.   ASSESSMENT:  1. Lumbar radiculopathy, possibly related to disk bulge at L4-L5.  2. Left hemianesthesia of unclear etiology is possibly related to      cervical pathology.  3. Anxiety.  4. Depression.  5. Hypertension uncontrolled.  6. Hypokalemia due to diuretic therapy, possibly aggravating      neurologic symptoms.  7. History of hyperlipidemia.  8. History of asthma.  9. History of breast cancer.   PLAN:  1. Will admit this lady for pain control and will give her the      benefits of a Neurology evaluation in the morning.  Will send a      courtesy consult to Dr. Donnie Coffin.  2. We will order MRI of her cervical and thoracic spine with and      without contrast in the morning.  After tonight we will order x-      rays of the thoracic and the cervical spine.  Will start the      [name] on Neurontin.  Other plans as per orders.      Vania Rea, M.D.  Electronically Signed     LC/MEDQ  D:  03/02/2009  T:  03/03/2009  Job:  [id number]   cc:   Dr. Lucas Mallow, Liam Graham, MD  Fax: [phone]   Jesse Sans. Wall,  MD, [facility]  [address]  [address]  [place]  [place] [address]

## 2011-01-25 NOTE — Assessment & Plan Note (Signed)
[facility]                            CARDIOLOGY OFFICE NOTE   [name]                       MRN:          [id number]  DATE:09/17/2008                            DOB:          16-Apr-1951    [name] comes in today for followup.  She has finished chemo and has  had her Port-A-Cath removed!  She unfortunately has had some spots on  her spine that are being currently evaluated.  Dr. Donnie Coffin is in charge of  this.   She has had no angina, no shortness of breath, no orthopnea, PND, or  peripheral edema.  She has finished her furosemide and she has not had  recurrence of her edema.  Please refer my last note.   PROBLEM LIST:  Coronary artery disease.  She has a history of an  anterior myocardial infarction in April 2003.  She had 2 stents to the  LAD.  Since that time, she has had remarkable improvement in her LV  function.  Her last EF was 73% with normal perfusion dated October 11, 2007.  Her last echo was in January 2009, showed an EF of 55-60% with  normal LV size and function as well as wall thickness.   CURRENT MEDICATIONS:  1. Xanax 1 mg p.o. b.i.d. p.r.n.  2. HCTZ 25 mg a day.  3. Inderal LA 160 mg a day.  4. Pravachol 40 mg nightly.  5. Vitamin D 1000 units a day.  6. Vitamin C.  7. Lisinopril 40 mg a day.   PHYSICAL EXAMINATION:  VITAL SIGNS:  Today, her blood pressure is lower  than usual at 102/84.  Her heart rate is 64 and regular, weight is 175.  HEENT:  She is awake, otherwise normal.  Carotids are full.  No bruits.  Thyroid is not enlarged.  Trachea is midline.  LUNGS:  Clear to auscultation and percussion.  HEART:  Normal S1 and S2.  No gallop, rub, or murmur.  ABDOMEN:  Soft, good bowel sounds.  EXTREMITIES:  No significant edema.  Pulses are intact.  NEUROLOGICAL:  Intact.   ASSESSMENT AND PLAN:  [name] is doing well from my standpoint.  We  have made no changes in medical program.  I will see her back  again in 6  months.     Thomas C. Daleen Squibb, MD, [facility]  Electronically Signed   TCW/MedQ  DD: 09/17/2008  DT: 09/18/2008  Job #: [id number]

## 2011-01-25 NOTE — Op Note (Signed)
[name], [name]                ACCOUNT NO.:  [id number]   MEDICAL RECORD NO.:  [id number]          PATIENT TYPE:  AMB   LOCATION:  DSC                          FACILITY:  [facility]   PHYSICIAN:  Angelia Mould. Derrell Lolling, M.D.DATE OF BIRTH:  08-07-1951   DATE OF PROCEDURE:  08/29/2008  DATE OF DISCHARGE:                               OPERATIVE REPORT   PREOPERATIVE DIAGNOSIS:  Breast cancer.   POSTOPERATIVE DIAGNOSIS:  Breast cancer.   OPERATION PERFORMED:  Removal of Port-A-Cath.   SURGEON:  Angelia Mould. Derrell Lolling, MD   OPERATIVE INDICATIONS:  This is a 60 year old white female who has  undergone neoadjuvant chemotherapy and subsequent lumpectomy and  sentinel node biopsy for what turned out to be a stage T2, N0, invasive  mammary carcinoma of the right breast.  She has had radiation therapy.  She has completed her chemotherapy.  She had been referred back to me by  Dr. Donnie Coffin for removal of her Port-A-Cath.  She has been counseled  regarding this procedure.  She requested sedation for this.  She was  brought to operating room electively.   OPERATIVE TECHNIQUE:  The patient was brought to the operating room,  placed supine on the operating table and was sedated and monitored by  Anesthesia Department.  A surgical time out was held. The left neck and  left chest wall were prepped and draped in a sterile fashion.  I could  palpate the port in the left infraclavicular area.  Xylocaine 1% with  epinephrine was used as a local infiltration anesthetic.  A transverse  incision was made through the previous scar.  Dissection was carried  down through the subcutaneous tissue.  I dissected the capsule around  the port.  This turned out to be a power port that we removed.  We cut  all the Prolene sutures and removed the sutures and dissected the port  and the catheter completely off and they came out intact without any  problems.  There was no bleeding.  The wound looked clean.  The  subcutaneous  tissue was closed with interrupted sutures of 3-0 Vicryl  and the skin closed with a running subcuticular suture of 4-0 Monocryl  and Dermabond.  Clean bandages were placed and the patient was taken to  recovery room in stable condition.  Estimated blood loss was about 5 mL.  Complications none.  Sponge, needle, and instrument counts were correct.      Angelia Mould. Derrell Lolling, M.D.  Electronically Signed     HMI/MEDQ  D:  08/29/2008  T:  08/30/2008  Job:  [id number]

## 2011-01-25 NOTE — Op Note (Signed)
[name], [name]                ACCOUNT NO.:  [id number]   MEDICAL RECORD NO.:  [id number]          PATIENT TYPE:  AMB   LOCATION:  DSC                          FACILITY:  [facility]   PHYSICIAN:  Angelia Mould. Derrell Lolling, M.D.DATE OF BIRTH:  [birth year]   DATE OF PROCEDURE:  10/05/2007  DATE OF DISCHARGE:                               OPERATIVE REPORT   PREOPERATIVE DIAGNOSIS:  Locally advanced cancer, right breast.   POSTOPERATIVE DIAGNOSIS:  Locally advanced cancer, right breast.   OPERATION PERFORMED:  Insertion of PowerPort vascular access device with  fluoroscopic guidance and interpretation.   SURGEON:  Angelia Mould. Derrell Lolling, M.D.   OPERATIVE INDICATIONS:  This is a 60 year old white female who has  presented with a large mass in the upper inner quadrant of the right  breast.  She has had multiple x-rays which suggested she has a 5 cm  tumor in the right breast in the upper inner quadrant.  Physical exam  reveals that it is at least this size and this has appeared in the last  9 months.  There is concern that it is progressive and rapidly growing.  Biopsy has shown that this is invasive breast cancer.  Following  evaluation by me, I referred her to Dr. Pierce Crane for consideration of  neoadjuvant chemotherapy.  He has agreed that this needs to be done and  has requested that a port be inserted.  The patient has been counseled  regarding indications and details and risks and complications of port  insertion and she is brought to operating room electively.   OPERATIVE TECHNIQUE:  The patient was brought to the operating room and  placed supine on the operating table.  She was monitored and sedated by  the anesthesia department.  Intravenous antibiotics were given.  The  patient's neck and chest and breast were prepped and draped in a sterile  fashion.  The patient was identified as to correct patient and correct  procedure.   I used 1% Xylocaine with epinephrine as a local infiltration  anesthetic.  A left subclavian venipuncture was performed without difficulty in a  single pass.  A guidewire was inserted into the superior vena cava using  fluoroscopic guidance.  Using fluoroscopic guidance, we mapped out on  the anterior chest wall where the tip of the catheter should be in the  distal superior vena cava near the right atrial junction.  We then made  a small incision at the wire insertion site.  About 2.5 cm below the  wire insertion site, we made a transverse incision and dissected down  through subcutaneous tissue to the pectoralis fascia.  Using a tunneling  device, we drew the catheter between the wire insertion site and the  port pocket site.  Using the markings on the chest wall, we measured how  long the catheter should be and then cut the catheter, secured it to the  port and secured it with a locking device.  The port and catheter were  then flushed with heparinized saline.  The port was secured to the  pectoralis fascia with three interrupted  sutures of 2-0 Prolene.  With  the patient back in Trendelenburg position, I inserted the dilator and  peel-away sheath assembly over the guidewire into the central venous  circulation.  The guidewire and dilator were removed.  The catheter was  inserted through the peel-away sheath.  The peel-away sheath was  removed.  The catheter flushed easily and had excellent blood return.  Fluoroscopy confirmed that the tip of the catheter was right at the  superior vena cava right atrial junction.  There was no kink or crimping  or distortion of the catheter anywhere along its course.  The catheter  flushed easily, had excellent blood return and we then flushed the  catheter with concentrated heparin solution.  The subcutaneous tissue  was closed with interrupted sutures of 3-0 Vicryl.  The skin incisions  were closed with subcuticular sutures of 4-0 Monocryl and Steri-Strips.  Clean bandages were placed and patient was taken to  the recovery room in  stable condition.  A chest x-ray will be obtained in the recovery room.   ESTIMATED BLOOD LOSS:  About 10 mL.   COMPLICATIONS:  None.   Sponge, needle and instrument counts were correct.      Angelia Mould. Derrell Lolling, M.D.  Electronically Signed     HMI/MEDQ  D:  10/05/2007  T:  10/05/2007  Job:  [id number]   cc:   Georgina Quint. Plotnikov, MD  Pierce Crane, MD

## 2011-01-25 NOTE — Discharge Summary (Signed)
NAMETRESHA, [name]                ACCOUNT NO.:  [id number]   MEDICAL RECORD NO.:  [id number]          PATIENT TYPE:  INP   LOCATION:  1319                         FACILITY:  [facility]   PHYSICIAN:  Pierce Crane, MD        DATE OF BIRTH:  12/18/1950   DATE OF ADMISSION:  02/14/2008  DATE OF DISCHARGE:  02/18/2008                               DISCHARGE SUMMARY   DISCHARGE DIAGNOSES:  1. History of locally advanced right breast cancer.  2. Hypertension.  3. Diarrhea, resolved.  4. Neutropenia during this admission, improving.  5. Thrush, improving.  6. Nausea, improved.  7. Hypertension.  8. History of cardiac stent times two in 2001.  9. History of headaches.  10.History of sinusitis.  11.History of asthma.  12.Irritable bowel syndrome.  13.History of anxiety.  14.Status post partial hysterectomy 23 years ago.  15.History of cholecystectomy, remote.   MEDICATIONS ON DISCHARGE:  1. Hydrochlorothiazide 25 mg p.o. daily.  2. Inderal LA 160 mg p.o. daily.  3. Reglan 10 mg p.o. at bedtime p.r.n. for nausea.  4. Xanax 1 milligram p.o. b.i.d.  5. __________ 400 mg p.o. b.i.d.  6. Doxycycline 100 mg p.o. b.i.d.  7. Diflucan 100 mg p.o. daily.  8. Prinivil 40 mg at bedtime.  9. Magic mouth wash p.r.n.  10.K-Dur 20 mEq p.o. daily.  11.Pravachol 40 mg p.o. at bedtime.  12.Aspirin 81 mg daily.  13.Vitamin D 1000 units p.o. daily.  14.Imodium p.r.n.  15.Albuterol p.r.n. metered-dose inhaler.   LABS ON DISCHARGE:  C. diff toxin negative, white count 1.7, hemoglobin  9.1, hematocrit 26.6, platelets 150, ANC 0.6.  Urine culture negative.  Sodium 142, potassium 3.8, BUN 4, creatinine 0.66, glucose 119, calcium  8.9 and total bilirubin is 0.8, alkaline phosphatase 58, AST 29, ALT 40,  total protein 5.5, albumin 3.4.  On radiological studies status post  chest x-ray on February 15, 2008, with no active cardiopulmonary disease.   CONDITION ON DISCHARGE:  Improved.  Follow up with Dr. Donnie Coffin, the  office  will call.   HISTORY OF PRESENT ILLNESS:  [name] is a pleasant 60 year old white  female with diagnosis of right breast carcinoma originally diagnosed by  biopsy in August 29, 2007, ER/PR-positive 1%, PR-negative, HER-2 neu  negative with a proliferative index of 38%.  For well detailed  information regarding all her therapies, as well as details on her  original diagnosis, please refer to the current H and P by Dr. Donnie Coffin on  February 14, 2008.  The current therapy is neoadjuvant setting since January  2009, status post 4 cycles of FEC given on an every 3 week basis with  very little response.  She is now receiving every 3 weeks Taxotere,  currently cycle 3, day 11 with Xeloda at 1000 mg p.o. b.i.d., 14 days on  and 7 days off.   HOSPITAL COURSE:  [name] was admitted to [facility]  with afebrile neutropenia with a white count of 0.8, ANC of 0.1 as she  presented with extreme fatigue and weakness.  She also had a  temperature  greater than 100 in the last 24 hours prior to admission, and as well  she had episodes of diarrhea.  In addition, she had some significant  oral sensitivity with mouth ulcers and problems with swallowing.  She  had mild peripheral neuropathy, which was transient.  Thus, she was  admitted for further evaluation and management of her symptoms.  Upon  admission, blood cultures were obtained, both port and peripheral.  In  the interim, she was started on Avelox 400 mg IV daily with the first  dose given immediately after cultures were obtained.  In addition, she  continued with her current medications, but Diflucan was added as well.  Over the following days, her status improved significantly with the  exception of diarrhea, which was still present.  Her CBC celiac cultures  were negative.  Her blood cultures and urinary cultures were negative as  well.  Avelox was discontinued, and the patient was placed on  doxycycline.  She continued  on acyclovir and Diflucan.  As of February 16, 2008, the patient's overall status was improving with the exception of 5-  6 loose stools as of February 16, 2008, but as of February 17, 2008, her diarrhea  had significantly subsided, although it continued to be monitored.  Today, the patient is afebrile, blood pressure stable, and she has good  saturation at room air.  She feels well, and she denies any new episodes  of diarrhea.  The neutropenic fever has significantly improved, and she  denies any nausea or vomiting.  Thus, due to the overall improvement in  her symptoms, Dr. Donnie Coffin has concluded that she is stable to be  discharged home.  Prescriptions for doxycycline, Diflucan and Reglan  have been given.  The patient knows to call to the [facility] if she has any questions or concerns or her symptoms exacerbate.  The office will call for the follow-up appointment.      Marlowe Kays, P.A.      Pierce Crane, MD  Electronically Signed    SW/MEDQ  D:  02/18/2008  T:  02/18/2008  Job:  [id number]

## 2011-01-25 NOTE — Consult Note (Signed)
[name], [name]                ACCOUNT NO.:  [id number]   MEDICAL RECORD NO.:  [id number]          PATIENT TYPE:  INP   LOCATION:  1318                         FACILITY:  [facility]   PHYSICIAN:  Pramod P. Pearlean Brownie, MD    DATE OF BIRTH:  03-24-1951   DATE OF CONSULTATION:  03/03/2009  DATE OF DISCHARGE:                                 CONSULTATION   REFERRING PHYSICIAN:  [facility].   REASON FOR REFERRAL:  Back pain and myelopathy.   HISTORY OF PRESENT ILLNESS:  [name] is a 60 year old Caucasian lady  with known history of invasive ductal carcinoma of the right breast with  partial mastectomy who has undergone neoadjuvant chemotherapy with Taxol  in the past.  She has been complaining of chronic back pain for several  years but since January of this year the back pain seems to have gotten  worse.  She states her back pain has actually resolved with response to  chemotherapy and radiation that she had gotten. Over the last 6 months  she has had slowly progressive back pain which she states is in the low  back.  She describes this as moderate to severe in intensity.  Occasionally it radiates down into the tail bone and up the back of  both legs, left more than right.  Yesterday she had sudden worsening of  her back pain and also noted that she had trouble controlling her urine.  On several occasions she passed urine in her clothes when she was on the  way to the bathroom and here in the hospital where she has a bed pan but  before that can be brought she cannot hold her urine.  She denies any  true radicular pain shooting down her legs.  She is also complaining of  some numbness in her legs.  The patient had a very inconsistent history  and exam as per my physician assistant earlier and the story that I have  obtained from her also seems a little different from what he obtained  earlier today.  She states the pain was quite severe 10/10 yesterday but  today seems a little  improved and is 3/10 in severity.  She has not  attempted to walk today.   PAST MEDICAL HISTORY:  1. Hypertension.  2. Coronary artery disease.  3. Right breast cancer as stated above.  4. Neutropenia from chemotherapy.   HOME MEDICATIONS:  Benadryl, Toradol, potassium.   ALLERGIES:  ZOLOFT, AMOXICILLIN, DARVOCET, VERAPAMIL, PENICILLIN,  CEPHALOSPORIN, VICODIN, CONTRAST DYE.   FAMILY HISTORY:  Father had pancreatic cancer.   SOCIAL HISTORY:  The patient lives with significant other, does not  work.  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   REVIEW OF SYSTEMS:  Positive for pain, gait difficulty,  bladder  incontinence.   PHYSICAL EXAM:  Reveals a middle-aged Caucasian lady who appears  currently not in distress.  She is afebrile, temperature 97.3, pulse  rate 67 per minute, regular, blood pressure 119/75, respiratory rate 20  per minute.  Distal pulses well felt.  HEAD:  Nontraumatic.  NECK:  Supple.  There is no bruit.  EENT EXAM:  Unremarkable.  CARDIAC EXAM:  No murmur or gallop.  LUNGS:  Clear to auscultation.  ABDOMEN:  Soft, nontender.  NEUROLOGIC EXAM:  She is awake, alert, oriented to time, place and  person.  There is no aphasia, apraxia, dysarthria.  She follows commands  well.  Eye movements are full range.  There is no nystagmus.  Face is  symmetric.  Tongue is midline.  Motor system exam reveals no upper  extremity drift.  She has symmetric strength, tone, reflexes in upper  extremities.  Lower extremity exam is limited to some degree by her back  pain but she does have antigravity strength in both upper and lower  extremities.  She has good distal strength.  She has mild hip flexor  weakness but this is limited mainly by the back pain and her poor  effort.  Deep tendon reflexes are 2+ symmetric at the knees and 1+ at  the ankles.  Plantars were both downgoing.  She has subjective  diminished patch pinprick sensation in the left foot from the hip down.  Her  gait was not tested.   DATA REVIEWED:  MRI scan of lumbar spine done yesterday shows mild  spondylitic changes without definite disk herniation.  Metastatic lesion  noted.  MRI scan of the brain done last week shows mild nonspecific  white matter hyperintensities but no definite structural lesion, tumor  or infarct.  No metastasis is seen.  Basic metabolic panel labs, UA and  WBC count all normal.   IMPRESSION:  [age]-year-old lady with chronic low back pain with  some acute exacerbation with new symptoms of left leg numbness and some  urinary urge incontinence for unclear etiology.  History and exam are  inconsistent and have some non-organic features but given her history of  malignancy, metastatic carcinoma to the thoracic spine with myelopathy  or meningeal carcinomatosis are considerations which need to be ruled  out.   PLAN:  I agree with MRI scan of thoracic and cervical spine with and  without contrast as well and if this shows no definite explanation for  the patient's symptoms, may consider doing a spinal tap to look for  abnormal cells.  Continue present pain management.  I had a long  discussion with the patient regarding her symptoms.  Discussed plan for  evaluation and answered questions.  Kindly call for questions.           ______________________________  Sunny Schlein. Pearlean Brownie, MD     PPS/MEDQ  D:  03/03/2009  T:  03/03/2009  Job:  [id number]

## 2011-01-25 NOTE — H&P (Signed)
[name], [name] NO.:  [id number]   MEDICAL RECORD NO.:  [id number]          PATIENT TYPE:  INP   LOCATION:  1319                         FACILITY:  [facility]   PHYSICIAN:  Pierce Crane, MD        DATE OF BIRTH:  10-26-1950   DATE OF ADMISSION:  02/14/2008  DATE OF DISCHARGE:                              HISTORY & PHYSICAL   HISTORY OF PRESENT ILLNESS:  [name] is a pleasant 60 year old woman  living in [place], currently with diagnosis of right breast  carcinoma.  She was evaluated in late 2008 for a palpable mass in the  right breast.  Bilateral diagnostic mammograms and right ultrasound were  obtained on August 29, 2007 showing 2 microlobulated adjacent  hypoechoic masses measuring 1.5 x 1.2 x 1.8 cm and 1.2 x 1.4 x 1.7 cm.  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  The right lateral needle biopsy showed invasive ductal  carcinoma, ER positive at 1%, PR negative, HER-2/neu 2+, but FISH  negative, proliferation index of 38%.  MRI was then obtained following  the biopsy, and this did in fact show a lesion in the upper inner  quadrant of the right breast measuring 4.9 x 2.6 x 4.8 cm.  With this  information, [name] was referred to Dr. Donnie Coffin for further  evaluation.   [name] started on neoadjuvant chemotherapy in January 2009.  She is  currently status post 4 cycles of FEC chemotherapy given on an every 3-  week basis.  Initially, she was receiving the FEC in a dose-dense  fashion, but had very poor tolerance, primarily secondary to mucositis  and PPD, and the treatments were subsequently given on an every 3-week  basis.  This was better tolerated.  Breast MRI was repeated following  these 4 cycles of FEC on December 11, 2007 and unfortunately showed no  radiographic evidence of response.  In fact by MRI, the mass seemed to  have increased slightly from original measurement of 4.3 x 3.4 x 2.1 cm  to 4.4 x 4 x  3.0 cm.  Nonetheless, clinically the mass seemed to be  slightly smaller.   [name] then proceeded to treatment with Taxotere/Xeloda on which she  continues.  She is receiving Taxotere on an every 3-week basis, today  being day 7 cycle 3 of 4 planned doses.  She is also receiving  concurrent Xeloda at a dose of 1000 mg p.o. b.i.d., 14 days on and 7  days off.  Of course, this is also day 7 cycle 3 of the Xeloda as well.   [name] has had multiple side effects noted throughout the cycles of  chemotherapy.  She does have asymptomatic chemotherapy-induced anemia.  She has had some significant afebrile neutropenia as well and receives  Neupogen injections following each treatment.  She had severe myalgias  and arthralgias secondary to the Neulasta, but the Neupogen seems to be  less painful for her.  She does have only some mild transient and  numbness and tingling secondary to the Taxotere  and has had some pain in  the lower extremities with mild edema bilaterally.  Again, this pain  seems to increase slightly after the Neupogen injections.  She has  recently also developed some sensitivity on the palms of hands  bilaterally with mild erythema but no frank peeling, cracking,  blistering or other evidence of PPE.  The skin on the feet at this point  does not tend to be affected.   Of significance today is the fact that [name]'s ANC is 0.1, her WBC is  0.8.  She reports that she did run a temperature as high as 100.8 last  night.  She started on Tylenol, the temperature went back to normal, and  she has had no fever recorded since.  She has had no Tylenol since early  this morning, and in fact on exam today, her temperature was normal at  98.3.  She continues to feel very poorly, however.  She is very weak and  fatigued.  She has had some mild diarrhea in the last 24 hours.  She  also notes an increased shortness of breath.  She does have a history of  asthma, but this seems to have worsened.  She  has a lot of sinus  congestion with yellow mucus when she blows her nose.  She has also  developed some very significant oral sensitivity with a few oral  ulcerations, sore throat, and some difficulty swallowing secondary to  the discomfort.   ALLERGIES:  [name] has an extensive list of allergies, all of which  apparently calls problems breathing.  1. Penicillin.  2. Azithromycin.  3. Sertraline hydrochloride.  4. Clidinium-chlordiaze.  5. Paroxetine hydrochloride.  6. Sulfa.  7. Keflex.  8. Gatifloxacin.  9. Darvocet.  10.Vicodin.  11.Intolerance of clear tape.   PAST MEDICAL HISTORY:  1. Right breast carcinoma as per HPI.  2. Hypertension.  3. History of cardiac stent x2 in 2001.  4. History of headaches.  5. History of sinusitis.  6. History of asthma.  7. Irritable bowel syndrome.  8. History of anxiety.  9. Partial hysterectomy 23 years ago.  10.History of cholecystectomy remotely.   CURRENT MEDICATIONS:  1. Xeloda 1000 mg p.o. b.i.d. 14 days on, 7 days off, currently day 7      of the cycle.  2. Aspirin 81 mg daily.  3. Xanax 1 mg p.o. b.i.d.  4. Decadron 8 mg p.o. b.i.d. as directed with chemotherapy.  5. Vitamin D 1000 units daily.  6. Amen as directed with chemotherapy.  7. Hydrochlorothiazide 25 mg daily.  8. Propranolol 160 mg daily.  9. Klor-Con 20 mEq daily.  10.Magic mouth wash as needed swish and spit.  11.Pravachol 40 mg daily  12.TobraDex eye drops 2 drops in each eye every 6 hours as needed.  13.Vitamin C 500 mg daily.  14.Lisinopril 40 mg daily.  15.Acyclovir 400 mg p.o. b.i.d.   SOCIAL HISTORY:  [name] currently resides with her significant other, [name].  She has four grown children and several grandchildren.  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  She does have a history of smoking, quit 25 years ago,  and smoked for 6-7 years.  No history of alcohol or illicit drug abuse.   FAMILY  HISTORY:  Father died of pancreatic cancer.  Mother still alive,  and family history is otherwise noncontributory.   REVIEW OF SYSTEMS:  As per HPI and currently complains of extreme  fatigue and weakness.  She does have a history of a temperature of  greater than 100 in the last 24 hours and is found to be neutropenic on  exam today.  She has had some recent episodes of diarrhea, currently  resolved.  She also has some significant oral sensitivity with mouth  ulcers and problems swallowing.  She has had some mild peripheral  neuropathy which is transient.  She also has some mild edema bilaterally  in the lower extremities, equal bilaterally.  She has noticed some skin  changes in her hands bilaterally, primarily some mild erythema and  tenderness but no frank cracking, peeling or blistering.  No significant  nail bed changes.  She has had no rashes, no evidence of abnormal  bleeding.  She has occasional nausea but denies any recent emesis.  No  change in urinary habits.  She has developed a dry cough but denies any  phlegm production or pleurisy.  She also notes shortness of breath and  has a history of asthma, but this seems to have worsened recently.  Also  a lot of sinus congestion with yellow mucus.  No epistaxis noted.  No  hemoptysis noted.  No chest pain, pressure, palpitations.  No abnormal  headaches or dizziness.  No additional arthralgias or bony pain noted  other than that following the Neupogen injections.   PHYSICAL EXAMINATION:  Ill-appearing female appearing her stated age  oriented x3 and in no acute distress, ambulatory on presentation  although she does appear weak.  VITAL SIGNS:  Blood pressure 147/94, pulse 88, respirations 16,  temperature 98.3, and weight 170.5.  Height is 64 inches.  HEENT: Sclerae anicteric.  Conjunctivae pink.  Oropharynx is  erythematous with some diffuse ulcers around the inside of the bottom  lip.  Also some evidence of candidiasis noted.   Significant pharyngeal  erythema noted.  NODES:  No peripheral lymphadenopathy is palpated.  BREAST EXAM:  Left breast is benign and pendulous with no masses,  discharge, skin change or nipple inversion.  Right breast also pendulous  with easily palpated mass in the upper outer quadrant measuring  approximately 3 x 4 cm, slightly smaller and softer than at  presentation, but with little change recently.  Mild erythema  immediately superior to the mass but no additional skin changes noted.  No nipple discharge or inversion noted.  LUNGS:  Mildly diminished breath sounds, bibasilar, with some diffuse  expiratory wheezing, greater on the right than the left.  No frank  crackles or rhonchi auscultated.  HEART:  Regular rate and rhythm with no gallops, murmurs or rubs.  ABDOMEN:  Soft, nontender, positive bowel sounds x4.  No organomegaly or  masses.  EXTREMITIES:  Nonpitting pedal edema.  Equal bilaterally.  No upper  extremity edema noted.  No peripheral cyanosis.  SKIN:  Mild erythema noted on the palms of the hands bilaterally but no  cracking, peeling, blistering, or additional evidence of PPE.  Skin is  otherwise benign.  GENITOURINARY:  Is deferred.  NEUROLOGICAL:  Is nonfocal.  MUSCULOSKELETAL:  No focal spinal tenderness to gentle percussion.   LABORATORY DATA:  White count 0.8, ANC 0.1, hemoglobin 10.2, hematocrit  29.5 and platelets 113,000.  CMET has been ordered and is currently  pending.   IMPRESSION AND PLAN:  Delightful 60 year old [place] woman with a  diagnosis of right breast carcinoma, originally diagnosed by biopsy  August 29, 2007 and found to be ER positive 1%, PR negative, HER-2/neu  negative with  a proliferative index of 38%.  Currently being treated in  the neoadjuvant setting since January of 2009.  Status post 4 cycles of  FEC given on an every 3-week basis with very little response.  Now  receiving every 3-week Taxotere, currently day 7 cycle 3, given   concurrently with Xeloda at 1000 mg p.o. b.i.d., 14 days on and 7 days  off.   [name] is now being admitted to [facility] oncology unit currently  with afebrile neutropenia, although she does have a history of a  temperature greater than 100 degrees within the last 24 hours.  We will  obtain blood cultures x2, both port and peripheral.  Will also obtain a  urinalysis and urine cultures as well as a chest x-ray.  She will be  started on Avelox 400 mg IV daily with the first dose to be given  a.s.a.p. as soon as cultures are obtained.  We will continue her current  medications and will add Diflucan as well.  We will also make sure that  [name] is well hydrated.   This case admission and plan have all been reviewed with Dr. Pierce Crane  who voices his agreement with our plan.      Zollie Scale, [place]      Pierce Crane, MD  Electronically Signed    AB/MEDQ  D:  02/14/2008  T:  02/14/2008  Job:  [id number]   cc:   Angelia Mould. Derrell Lolling, M.D.  [address]  [place]  [place] [address]   Georgina Quint. Plotnikov, MD  [address]  [place]  [place] [address]

## 2011-01-25 NOTE — Op Note (Signed)
[name], [name]                ACCOUNT NO.:  [id number]   MEDICAL RECORD NO.:  [id number]          PATIENT TYPE:  AMB   LOCATION:  DSC                          FACILITY:  [facility]   PHYSICIAN:  Angelia Mould. Derrell Lolling, M.D.DATE OF BIRTH:  17-Mar-1951   DATE OF PROCEDURE:  05/06/2008  DATE OF DISCHARGE:                               OPERATIVE REPORT   PREOPERATIVE DIAGNOSIS:  Locally advanced carcinoma of the right breast,  status post neoadjuvant chemotherapy.   POSTOPERATIVE DIAGNOSIS:  Locally advanced carcinoma of the right  breast, status post neoadjuvant chemotherapy.   OPERATION PERFORMED:  1. Injection of blue dye, right breast.  2. Right partial mastectomy with specimen mammogram.  3. Right axillary sentinel node biopsy.   SURGEON:  Angelia Mould. Derrell Lolling, MD   OPERATIVE INDICATIONS:  This is a 61 year old white female who presented  on September 10, 2007, with a large palpable mass in the right breast.  On exam, she had what appeared to be about a 5-6-cm mass at the 1  o'clock position of the right breast, but it did not appear to be fixed  to the chest wall and did not appear to be invading the skin.  Image-  guided biopsy showed that 2 areas were biopsied and 2 clips were left, 1  showed ductal carcinoma in situ and the other showed invasive cancer.  The hormone receptors were essentially negative and HER-2/neu was  essentially negative by FISH.  By MRI, the tumor was 4.9 cm.  There was  no apparent axillary adenopathy.  She saw Dr. Pierce Crane who has been  giving her neoadjuvant chemotherapy.  She has had 2 different regimens  and she has had some reduction in the size of the tumor, but it remains  large and palpable.  She is strongly motivated to have breast  conservation.  I told her that we could attempt to do that and she is  aware that we will have to take a large amount of tissue that might pull  the breast up some.  I have told her that if we got positive margins, we  might have to go back another day for mastectomy.  She is comfortable  with that, but still wants to try.  Because her breasts are large, I  think that this is reasonable to attempt.  She is brought to operating  room electively.   OPERATIVE TECHNIQUE:  The patient was brought to [facility] to the  holding area where she underwent injection of radionuclide by the  Nuclear Medicine technician.  The patient was then taken to the  operating room.  General anesthesia was induced using an LMA device.  Following an alcohol prep, I injected 5 mL of blue dye in the right  retroareolar area.  This was 2 mL of methylene blue mixed with 3 mL of  saline.  The breast was then massaged for about 5 minutes.  The patient  received intravenous antibiotics.  The right breast, chest wall, right  axilla, and right shoulder were then prepped and draped in a sterile  fashion.  Using a marking pen, I marked the intended partial mastectomy.  This was  a curved transverse incision at least 25 cm in length, taking a ellipse  of skin probably 6 cm in width overlying the tumor.  Using a knife, I  made the incision.  Using electrocautery, I dissected widely around the  tumor all the way down to the chest wall taking the pectoralis fascia  with the specimen.  I felt that I got all the way around the tumor  grossly with at least a 1-cm margin, perhaps more.  The tumor was marked  with a silk suture at the medial edge of the skin ellipse.  I used the 6-  color margin marker kit to mark the specimen in 6 orientations.  We did  a specimen mammogram and I could see the tumor and both of the metal  clips.  Dr. Anselmo Pickler in the [facility] also  reviewed this and said that we appeared to have everything that  radiographically needed to be removed.  This specimen was then sent for  routine histology.  The breast incision was irrigated with saline.  Electrocautery was used to control bleeders.  We  reconstructed the  breast in about 3 layers.  Deep layer of breast tissue was brought  together with interrupted sutures of 3-0 Vicryl.  A superficial layer of  breast tissue was brought together with interrupted suture of 3-0 Vicryl  and the skin closed with skin staples.   Attention was directed to the right axilla.  The NeoProbe was used.  We  found a focal area of markedly increased radioactivity.  We made a  curved transverse incision overlying this.  Dissection was carried down  into the right axilla using the NeoProbe as a guide.  We identified a  single very blue, very hot lymph node and removed that.  After that was  removed, there was no more radioactivity remaining and so we only had  the single sentinel node.  This was sent to pathology.  Dr. Dierdre Searles performed  imprint cytology and I spoke with her and she said that there was no  cancerous cells seen on the imprint cytology.  The right axillary wound  was irrigated with saline.  Hemostasis was excellent.  The deeper  tissues were closed with interrupted sutures of 3-0 Vicryl and the skin  closed with skin staples.  The wounds were cleansed and dried and  covered with Adaptic gauze, 4x4s, ABDs, and Hypafix tape.  A 6-inch Ace  wrap was planned as well.  Ice packs were planned.  The patient  tolerated the procedure well and was taken to the recovery room in  stable condition.  Estimated blood loss was about 30 mL.  Complications  were none.  Sponge, needle, and instrument counts were correct.      Angelia Mould. Derrell Lolling, M.D.  Electronically Signed     HMI/MEDQ  D:  05/06/2008  T:  05/07/2008  Job:  [id number]   cc:   Pierce Crane, MD  Georgina Quint. Plotnikov, MD  Elinor Parkinson. Worthy Rancher, M.D.

## 2011-01-25 NOTE — Assessment & Plan Note (Signed)
[facility]                            CARDIOLOGY OFFICE NOTE   [name], [name]                       MRN:          [id number]  DATE:10/09/2007                            DOB:          14-Apr-1951    [name] is referred today by Dr. Pierce Crane for her history of  coronary artery disease, clearance for 16 weeks of chemotherapy for  right breast cancer, and possible right breast surgery with Dr. Claud Kelp in the future.   She is a delightful 60 year old white female who has been a former  patient of Dr. Chales Abrahams and Dr. Gerri Spore.  She is followed by Dr.  Posey Rea from primary care.   She has had previous catheterization x2 showing a 40% narrowing in the  mid LAD, 25% ostium of the proximal circumflex, no significant disease  in the right coronary.  Left ventricular function has been normal.   Dr. Donnie Coffin recently ordered a 2-D echocardiogram on September 20, 2007 in  anticipation of the chemotherapy.  Her EF was 55%-60%, normal left  ventricular chamber size and function and wall thickness, mild basal  septal hypertrophy.  She had no significant valvular disease.   She has been having some chest discomfort in the right upper chest.  This is since she had a Port-A-Cath placed last week by Dr. Derrell Lolling.  She  does have some dyspnea on exertion.   PAST MEDICAL HISTORY:  Her cardiac risk factors are hyperlipidemia on  pravastatin, hypertension, and mild obesity.  She does not exercise.  She quit smoking in 1984.  She does not drink alcohol.   ALLERGIES:  She has multiple drug allergies.  Please refer to the chart.   PAST SURGICAL HISTORY:  Has been negative except for the Port-A-Cath.   FAMILY HISTORY:  Negative for premature coronary disease.   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  She is separated.  Has four children.   REVIEW OF SYSTEMS:  Has a history of allergies and hay fever  and  irritable bowel syndrome.   PHYSICAL EXAMINATION:  VITAL SIGNS:  Blood pressure is 153/96 today,  usually it is better, heart rate 75, weight 166, 5 feet 3 inches.  HEENT:  Normocephalic, atraumatic.  PERRLA.  Extraocular movements  intact.  Sclerae are clear.  Facial symmetry is normal.  NECK:  Carotid upstrokes are equal bilaterally without bruits.  There is  no JVD.  Thyroid is not enlarged.  Trachea is midline.  LUNGS:  Clear.  HEART:  Reveals a poorly appreciated PMI.  She has large breasts, normal  S1, S2.  No gallop.  ABDOMEN:  Soft, good bowel sounds.  No midline bruit, no hepatomegaly.  EXTREMITIES:  With no cyanosis, clubbing or edema.  Pulses are intact.  NEUROLOGICAL:  Intact.   Electrocardiogram shows sinus rhythm.  This is essentially normal.   ASSESSMENT:  1. Nonobstructive coronary disease.  She has not had an objective      assessment of her coronary disease in about four years.  She does  have some chest discomfort which sounds nonischemic.  2. Normal left ventricular function.  3. Breast cancer getting ready to undergo chemotherapy.  She is going      to receive epirubicin, Paclitaxel and 5-FU as well as Cytoxan.      There is some potential cardiotoxicity with the first two as I have      discussed with her today.   PLAN:  1. Adenosine rest stress Myoview to rule out any progressive coronary      disease.  2. Continue current medications.  3. See back in three months.  At that time we will repeat a 2-D      echocardiogram after her chemotherapy.     Thomas C. Daleen Squibb, MD, [facility]  Electronically Signed    TCW/MedQ  DD: 10/09/2007  DT: 10/10/2007  Job #: [id number]   cc:   Pierce Crane, MD  [facility]  Georgina Quint. Plotnikov, MD

## 2011-01-25 NOTE — Assessment & Plan Note (Signed)
[facility]                            CARDIOLOGY OFFICE NOTE   [name], [name]                       MRN:          [id number]  DATE:03/26/2008                            DOB:          07-Sep-1951    [name] returns today for further management of nonobstructive  coronary disease.  She has now finished her chemotherapy for breast  cancer.   Followup 2D echocardiogram today shows normal left ventricular chamber  size and overall systolic function.  Her EF is normal without any  segmental wall motion abnormalities.   She has had some lower extremity edema.  Dr. Posey Rea placed her on  furosemide 20 mg p.r.n. yesterday.  I told her to take it until the  edema resolves.  It is probably multifactorial and not cardiac.   Her medications are otherwise unchanged.  Please refer the maintenance  medication list.   PHYSICAL EXAMINATION:  VITAL SIGNS:  Her blood pressure is 133/80, her  pulse is 90 and regular.  Her electrocardiogram shows sinus rhythm with  poor progression across the anterior precordium which is stable.  HEENT:  Unchanged.  NECK:  Carotids upstrokes are equal bilaterally.  There is no JVD.  Thyroid is not enlarged.  Trachea is midline.  LUNGS:  Clear.  HEART:  A poorly appreciated PMI.  She has no obvious gallop.  Normal S1  and S2.  ABDOMEN:  Soft.  Good bowel sounds.  Organomegaly could not be assessed.  EXTREMITIES:  1+ to 2+ pitting edema.  Pulses are intact.  NEURO:  Intact.  There is no sign of DVT.   [name] will stay on furosemide 20 mg daily until her edema  resolves.  If she is doing well, I will plan on seeing her back in 6  months.     Thomas C. Daleen Squibb, MD, [facility]  Electronically Signed    TCW/MedQ  DD: 03/26/2008  DT: 03/27/2008  Job #: [id number]   cc:   Pierce Crane, MD

## 2011-01-27 NOTE — Op Note (Signed)
NAMEALICEA, [name]                ACCOUNT NO.:  [id number]  MEDICAL RECORD NO.:  [id number]           PATIENT TYPE:  LOCATION:                                 FACILITY:  PHYSICIAN:  Earvin Hansen L. Shon Hough, M.D.DATE OF BIRTH:  10-23-1950  DATE OF PROCEDURE:  01/24/2011 DATE OF DISCHARGE:                              OPERATIVE REPORT   This is a 60 year old lady with history of right breast cancer, had previous large lumpectomy removing over one-half of that breast.  She also has severe macromastia on the left side causing increased pain, discomfort, shoulder grooving, and imbalance of body secondary to its size.  It is probably at least a G-cup.  PROCEDURES PERFORMED:  Right mastopexy full, left reduction mammoplasty.  General anesthesia.  Preoperatively, the patient was sat up and drawn for the surgeries on the right mastopexy and left reduction mammoplasty using the inferior pedicle technique.  All procedures in detail had been explained to the patient preoperatively as well as in the office setting.  All questions were answered.  She understands all the potential risks including scarring, infection, bleeding irregularity of the implants with asymmetry and infection.  With this, she still consents to surgery.  She was then taken to the operating room, placed on the operating room table in supine position, was given adequate general anesthesia after drawings had been done previously.  Prep was done to the chest and breast areas in routine fashion using Hibiclens soap and solution and walled off with sterile towels and drapes so as to make a sterile field.  One 0.25% Xylocaine with epinephrine with injected 1:400,000 concentration to the right and left breast areas.  The right side was done first with a full lifting procedure.  The skin was de-epithelialized using #20 blade. Medial and lateral skin pedicles were de-epithelialized as well.  Some excess skin was removed and fatty  breast tissue removed laterally.  I was able to then advance the flaps back together and secured them with 3- 0 Prolene sutures.  Subcutaneous closure was done with 3-0 Monocryl x2 layers and running subcuticular stitch of 3-0 Monocryl throughout the inverted T for the full mastopexy.  The wounds were drained with #10 fully-fluted Blake drains which were placed in the depths of the wound and brought out the lateral-most portion of the incision and secured with 3-0 Prolene suture.  Next, on left side the inferior pedicle reduction mammoplasty was fashioned.  Skin of the inferior pedicle was de-epithelialized with #20 blades.  Medial and lateral fatty dermal pedicles were incised down to underlying fascia.  The new keyhole area was also debulked and the flap was trimmed appropriately.  After proper hemostasis, the flap was transposed and stayed with 3-0 Prolene sutures. Subcutaneous closure was done with 3-0 Monocryl x2 layers, then running subcuticular stitch of 3-0 Monocryl and 5-0 Monocryl throughout the inverted T.  We used a right side in terms of size reduction and we got as close as possible with that.  The wounds were drained with #10 fully- fluted Blake drains which were placed in the depths of the wound and brought out  through the lateral-most portion of the incision and secured 3-0 Prolene.  At the end of the procedure, the periareolar areas were examined with nipple being supple with good blood supply.  There was some slight redness at the inferior portion of the left breast underneath the incision area which looked almost reaction area but actually improved throughout the procedure.  The wounds were cleansed.  Steri-Strips and soft dressing were applied including ABDs, 4 x 4's, Hypafix tape.  She withstood the procedure very well.  Estimated blood loss 200 mL.  Complications none.     [name]. Shon Hough, M.D.     Cathie Hoops  D:  01/24/2011  T:  01/24/2011  Job:   [id number]  Electronically Signed by Louisa Second M.D. on 01/27/2011 03:15:17 PM

## 2011-01-28 NOTE — H&P (Signed)
. [facility]  Patient:    [name], [name] Visit Number: [id number] MRN: [id number]          Service Type: MED Location: 2000 2030 01 Attending Physician:  Colon Branch Dictated by:   Lorain Childes, M.D. [facility] Admit Date:  02/17/2002                           History and Physical  PRIMARY CARE Marica Trentham:  Sonda Primes, M.D. PRIMARY CARDIOLOGIST:  Veneda Melter, M.D. [facility]  CHIEF COMPLAINT:  Chest pain.  HISTORY OF PRESENT ILLNESS:  The patient is a 60 year old female with history of non-flow-limiting coronary disease who was admitted with chest pain.  The patient reports that for the past three weeks she has had increasing shortness of breath and fatigue and also mild left chest sharp pain.  She has still been able to do her activities of daily living without any interruption, but she does report she is not at her baseline.  Today she attended a family reunion and noted some mild shortness of breath and nausea.  This persisted and then this evening around 4:30 p.m. she began developing pressure in her chest described as tightness, "like someone pushing on you."  She rated this as a 9/10, and this occurred while she was lifting her 33-month-old grandson.  She sat down, and the pain gradually decreased over the next 30 minutes.  She then came in for evaluation later this evening.  She reports complete relief of pain by 10:30 this evening once she received an aspirin in the emergency room. She denies any palpitations, though reports shortness of breath.  She denies any PND, no orthopnea.  She does report pedal edema.  She reports nausea, no vomiting, no syncope, no diaphoresis.  She does say that this pain is similar to her pain during her last presentation in February 2001, when she underwent a cardiac catheterization; however, this pain was a little more severe.  PAST MEDICAL HISTORY: 1. Chest pain and near-syncope in 2001.    a.  Cardiac catheterization in February 2001 done by Daisey Must, M.D.       Her EF was noted to be greater than 50%.  She had normal renal arteries,       no aorta or iliac disease.  Her left main was normal.  She is codominant.       Her LAD had diffuse 40% proximal and a focal 30% mid-.  Her left       circumflex was normal.  Her right coronary artery was normal. 2. History of anxiety disorder. 3. Irritable bowel syndrome with a negative MRI of the abdomen and CT of the    abdomen and pelvis in February 2001. 4. Chronic sinusitis. 5. Status post cholecystectomy. 6. Status post TAH. 7. History of headaches, status post MRI of the brain in February 2001,    which was negative. 8. Hypertension with history of hypertensive urgency in 2001.  ALLERGIES:  SULFA and ZOLOFT.  Both of these cause edema.  Denies any contrast allergies.  She did fine with her last cardiac catheterization and with her prior CTs.  MEDICATIONS: 1. Inderal 160 mg XL p.o. q.d. 2. Lotensin 40 mg p.o. q.h.s. 3. Hydrochlorothiazide 25 mg p.o. q.d. 4. Xanax 0.5 mg p.o. t.i.d. 5. Cerestin 0.625 mg p.o. q.d. 6. Z-Pak, which she has been taking for the past three days and will finish  tomorrow.  SOCIAL HISTORY:  She lives in [place] with her husband.  She is a day Occupational hygienist.  She is married, has four children, one living at home.  She had a three pack-year history of tobacco.  She quit 20 years ago.  She denies any alcohol, no drugs, no herbal medication, over-the-counter medications.  FAMILY HISTORY:  Her mother is alive at the age of 77.  She has a history of palpitations.  Her father died at the age of 67.  He had no coronary disease. She has a sister who has mitral valve prolapse, and she has brothers who are healthy.  REVIEW OF SYSTEMS:  Remarkable for subjective fevers and chills a few days ago, for which she was initiated on a Z-Pak for sinusitis.  She denies any sweats or weight changes, no  lymphadenopathy.  She had a sinus headache which has since improved.  She has had nasal discharge, no nasal bleeding or weight changes, no vertigo.  She does not have any hearing loss.  She denies any dental problems.  She did report visual changes with her sinus headache, which have since resolved.  SKIN:  She has no rashes or lesions.  CARDIOPULMONARY: Per the HPI, otherwise unremarkable.  GENITOURINARY:  She denies any urinary frequency, dysuria, no hematuria, nocturia.  She is surgically postmenopausal. NEUROPSYCHIATRIC:  She denies any weakness or numbness.  She does have anxiety.  She denies any arthralgias or joint swelling or joint deformities. She has some nausea, no vomiting, no diarrhea, and no bright red blood per rectum, no melena, no hematemesis.  No dysphagia or odynophagia.  No gastroesophageal reflux disease.  She does have mild abdominal pain, which is chronic.  She has had no change in her bowel habits and denies any polyuria or polydipsia.  No heat or cold intolerance, no skin or hair changes.  All other review of systems is negative.  PHYSICAL EXAMINATION:  VITAL SIGNS:  Temperature is 97.0, pulse 76, respirations 18, blood pressure 122/75.  She is saturating 98% on room air.  GENERAL:  She is a pleasant woman in no acute distress.  HEENT:  Normocephalic, atraumatic.  Pupils round and reactive to light. Extraocular movements intact.  Sclerae are white.  Conjunctivae are pink. Oropharynx is clear without any erythema or exudate.  Dentition is normal without evidence of abscesses.  NECK:  Supple with full range of motion, without any lymphadenopathy.  She has no bruits.  Her thyroid is normal without evidence of thyromegaly.  Her JVP is approximately 6 cm.  CHEST:  Her lungs are clear to auscultation bilaterally without any wheezes, rales, or rhonchi.  CARDIAC:  She is normal S1, split S2, regular rate and rhythm.  She has no murmur, no rubs, no gallops.  Her  PMI is nondisplaced.  Pulses are 2+ and equal without any bruits.  SKIN:  No rashes or lesions.   ABDOMEN:  Soft, positive bowel sounds throughout.  She has no rebound, no guarding.  She has mild diffuse tenderness which was nonfocal.  Her liver is approximately 7 cm in span.  EXTREMITIES:  She has no edema, no clubbing, no cyanosis, no rashes, no petechiae.  She has varicosities which are noted.  MUSCULOSKELETAL:  No joint deformities, no effusions, no CVA tenderness.  NEUROLOGIC:  She is alert and oriented x3.  Cranial nerves II-XII are intact. Her strength is 5/5 upper extremities and lower extremities bilaterally.  She has normal sensation throughout.  Normal cerebellar function.  LABORATORY DATA:  EKG shows sinus rhythm, rate of 75, normal axis, normal intervals, no Qs.  No ST elevation or depression.  No T-wave inversions.  No evidence of hypertrophy.  No change from prior EKG from February 2001.  Her laboratory data shows a hemoglobin of 12.8, hematocrit of 37.4, white count of 9.9, platelet count of 202.  Sodium 138, potassium 3.4, BUN 15, creatinine 0.9, glucose 116.  CK 89, MB of 2.1, troponin of 0.01.  ASSESSMENT AND PLAN:  The patient is a 60 year old female with history of non-flow-limiting coronary disease admitted with chest pain.  1. Coronary artery disease.  Will admit her to telemetry, serial cardiac    enzymes, and follow EKG.  Will start her on Lovenox and aspirin and Nitrol    paste.  Will continue her Inderal.  Will obtain a stress test to evaluate    for any ischemia.  Will check for risk factors with checking a fasting    lipid panel, a hemoglobin a1C, TSH. 2. Anxiety.  The patient sounds like she has significant anxiety disorder.    She has had multiple evaluations for complaints of pain.  Will continue her    Xanax, and she may benefit from further assistance with her anxiety to    decrease her stress.  Will monitor this. 3. Hypertension.  It is  well-controlled currently.  Will continue on her    current medications. 4. Chronic sinusitis.  The patient has had a recent acute exacerbation of her    sinusitis.  A CT scan was done in the emergency room by the ER physician.    The results of this are pending.  Will follow up on the results of this.    Will continue her on her Z-Pak and Clarinex for now.  She will receive her    final dose of azithromycin tomorrow. 5. Irritable bowel syndrome.  The patient has some vague mild diffuse    abdominal discomfort.  She has had a previous workup with negative CTs and    MRIs.  She also has had negative colonoscopy and EGD.  She is not    complaining of any exacerbation of her abdominal discomfort.  Will continue    to monitor her.  Will start her on Protonix, as this may help as she had    some mild epigastric discomfort. 6. Shortness of breath.  The patient did complain of shortness of breath.  She    does relate this much to her sinusitis and some postnasal drip and    congestion.  Her saturations are fine on room air.  We will check a chest    x-ray to rule out any air space disease.  Will also check a BNP to rule out    for any possible left ventricular dilatation.  Will continue to monitor    her.Dictated by:   Lorain Childes, M.D. [facility] Attending Physician:  Colon Branch DD:  02/18/02 TD:  02/19/02 Job: [id number]

## 2011-01-28 NOTE — Assessment & Plan Note (Signed)
[facility]                             PRIMARY CARE OFFICE NOTE   ZEPHYR, SAUSEDO                       MRN:          [id number]  DATE:07/14/2006                            DOB:          [birth year]    PROCEDURE:  Cryosurgery.   INDICATION:  Lesion suspicious for actinic keratosis, 6 mm, on the right  side.   Risks and benefits explained to the patient in detail and she agreed to  proceed.  The lesion was treated in the usual fashion with liquid nitrogen,  tolerated well.  Complications:  None.  Will do a biopsy if it recurs.    ______________________________  Georgina Quint Plotnikov, MD    AVP/MedQ  DD: 07/19/2006  DT: 07/19/2006  Job #: [id number]

## 2011-01-28 NOTE — Cardiovascular Report (Signed)
[facility]  Patient:    CHERAL, CAPPUCCI Visit Number: [id number] MRN: [id number]          Service Type: CAT Location: [facility] 2866 01 Attending Physician:  Veneda Melter Dictated by:   Veneda Melter, M.D. Proc. Date: 03/14/02 Admit Date:  03/14/2002 Discharge Date: 03/14/2002   CC:         Sonda Primes, M.D. [facility]  [facility]   Cardiac Catheterization  PROCEDURE: 1. Left heart catheterization 2. Left ventriculogram. 3. Selective coronary angiography. 4. Intravascular ultrasound of left circumflex artery. 5. Intravascular ultrasound of the left anterior descending artery.  DIAGNOSES: 1. Single-vessel coronary artery disease. 2. Normal left ventricular systolic function.  CARDIOLOGIST:  Veneda Melter, M.D.  HISTORY:  [name] is a 60 year old white female with a known history of mild coronary artery disease by prior cardiac catheterization in 2001.  The patient has been treated medically in the interim; however, she recently has had increasing complaints of substernal chest discomfort.  She presents for further cardiac assessment.  TECHNIQUE:  After informed consent was obtained, the patient was brought to the catheterization lab.  A 6-French sheath was placed in the right femoral artery.  The 6-French JL4 and JR4 catheters were then used the engage the right coronary artery.  Selective angiography was then performed in various projections using manual injection of contrast.  A pigtail catheter was advanced in the left ventricle and left ventriculogram performed using manual injections of contrast.  Subsequently intravascular ultrasound of the left circumflex and LAD arteries was performed; 2000 units of heparin given intravenously.  A 6-French CLS catheter was used to engage the left coronary artery.  A 0.014 inch floppy wire was advanced into the distal circumflex and then the distal LAD.  Intravascular ultrasound  was performed using automated pullback.  Repeat angiography showed no evidence of vessel damage. The patient tolerated the procedure well.  Intracoronary nitroglycerin was used prior to guide shots and IVUS.  At the termination of the case, catheters and sheaths were removed and manual pressure applied until adequate hemostasis was achieved.  The patient tolerated the procedure well, and the patient was transferred to the floor in stable condition.  FINDINGS: 1. Left main trunk: A large caliber vessel with mild irregularities. 2. LAD.  This begins as a large caliber vessel and tapers distally.  It    provides three diagonal branches.  There is evidence of eccentric    calcification in the mid LAD with moderate disease of 50% by angiography.    There is mild haziness in the mid LAD as well.  The distal LAD has luminal    irregularities.  The first two diagonal branches are major vessels and have    mild irregularities.  The third diagonal branch is a triple vessel. 3. Left circumflex artery: This is a medium caliber vessel that provides three    marginal branches at the mid section and a small PDA distally.  It is    codominant.  There is mild disease of 10 to 20% by angiogram. 4. Right coronary artery is codominant.  This is a medium caliber vessel that    provides a small posterior descending artery in its terminal segment.    The right coronary artery has luminal irregularities.  LEFT VENTRICULOGRAPHY:  Normal end-systolic and end-diastolic dimensions. Overall left ventricular function is well preserved, ejection fraction greater than 65%.  No mitral regurgitation.  LV pressure 140/10, aortic 140/85.  LV EDP equals  20.  INTRAVASCULAR ULTRASOUND:  The left circumflex artery shows mild eccentric plaque in the proximal segment.  Intravascular ultrasound of the LAD shows this to be a medium caliber vessel distally of 3 mm, a large caliber vessel of 3.5 mm proximally.  There is heavy  concentric plaque as well as calcification in the mid section with a lumen of approximately 2.2 to 2.5 mm in diameter.  ASSESSMENT/PLAN:  [name] is a 60 year old female with moderate single-vessel coronary artery disease.  This does not appear to be critical at this point.  Continued medical therapy will be pursued.  Should the patient have recurrent symptoms, dobutamine echocardiogram may be helpful in determining of she has ischemia in the anterior wall. Dictated by:   Veneda Melter, M.D. Attending Physician:  Veneda Melter DD:  03/14/02 TD:  03/18/02 Job: [id number]

## 2011-01-28 NOTE — Discharge Summary (Signed)
[name], [name]                            ACCOUNT NO.:  [id number]   MEDICAL RECORD NO.:  [id number]                    PATIENT TYPE:   LOCATION:                                       FACILITY:   PHYSICIAN:  [id number]                                DATE OF BIRTH:  September 22, 1950   DATE OF ADMISSION:  02/17/2002  DATE OF DISCHARGE:  02/21/2002                           DISCHARGE SUMMARY - REFERRING   PROCEDURES:  1. Adenosine Cardiolite.  2. Esophagogastroduodenoscopy.  3. CT of the sinuses.   HOSPITAL COURSE:  The patient is a 60 year old female who is status post  cardiac catheterization in 2001 showing no flow-limiting coronary artery  disease.  She came to the emergency room on February 17, 2002 for chest pain.  It  was described as pressure and tightness and rated a 9/10.  It occurred with  exertion.  She was admitted to rule out MI and for further evaluation.   Her enzymes were negative for MI and she had an adenosine Cardiolite on February 19, 2002.  The Cardiolite showed satisfactory overall contraction of the  left ventricular wall with an EF of 69% and some focal ischemia in the  inferolateral wall.  The Cardiolite images were reviewed with the prior  Cardiolite performed before her cardiac catheterization in 2001 and the  images were unchanged.  Since there was no flow-limiting disease on the  cardiac catheterization in 2001, it was felt that she did not need repeat  cardiac catheterization.  A GI consult was called.   The patient was seen by Dr. Hedwig Morton. Brodie with [facility].  An upper  endoscopy was performed which was normal for the esophagus, stomach and  duodenum.  Dr. Juanda Chance felt that the patient could possibly have  gastroesophageal reflux and therefore recommended Protonix 40 mg q.d. for  the next six to eight weeks.  This medication was added to her medication  regimen.   The patient had a D-dimer checked, which was elevated at 1.33.  She  additionally complained of  chronic sinusitis and a CT of her sinuses showed  mild chronic maxillary sinusitis bilaterally with mild mucosal swelling in  the nasal cavity.  Because there were no acute issues, no further treatment  was needed at this time.  A VQ scan was performed to rule out PE with an  elevated D-dimer.  The VQ scan was low probability for pulmonary embolism  and she had lower extremity Dopplers performed as well which showed no  evidence of DVT, superficial thrombosis or Baker's cysts bilaterally.   An internal medicine consult had been called to help [place] manage her multiple  medical problems and their assistance was greatly appreciated.  Her family  physician is Dr. Georgina Quint. Plotnikov and she is to follow up with him on  the GI issues, as well as the sinusitis and other medical problems.  On February 21, 2002, the patient was complaining of left arm and left-neck-to-left-  ulnar numbness and pain.  It was felt that this was possible cervical  radiculopathy and that outpatient followup was adequate.  Prior to  discharge, the patient had an MRI appointment and will follow up with her  primary care physician.  She was considered stable for discharge on February 21, 2002.   LABORATORY VALUES:  Hemoglobin 12.1, hematocrit 35.3, WBC 7.7, platelets  211,000.  Fecal occult blood negative.  Sodium 139, potassium 3.4  (supplemented), chloride 106, CO2 26, BUN 10, creatinine 0.7, glucose 117.  Other CMET values within normal limits.  Hemoglobin A1c 5.7.  Total  cholesterol 178, triglycerides 60, HDL 70, LDL 96.  TSH 2.048.   Chest x-ray:  Lungs are clear with heart size and mediastinal contours  normal.  VQ scan:  Low probability for pulmonary embolism.   DISCHARGE CONDITION:  Stable.   CONSULTANTS:  1. Dr. Lina Sar.  2. Dr. Stacie Glaze.   DISCHARGE DIAGNOSES:  1. Chest pain, no change in Cardiolite performed this admission, with     enzymes negative for myocardial infarction.  Gastrointestinal  workup also     negative for causative factors for chest pain and VQ scan low probability     for pulmonary embolus.  2. Acute-on-chronic sinusitis, antibiotics completed during this admission.  3. Hypokalemia.  4. Normocytic anemia.  5. Glucose intolerance with normal hemoglobin A1c.  6. Nonobstructive coronary artery disease by catheterization in February of     2001 with left anterior descending 40% and 30% stenosis, with no     significant coronary artery disease in any other vessels and a normal     ejection fraction.  7. Elevated D-dimer with VQ scan low probability for pulmonary embolism.  8. Left-neck-to-left-ulnar area numbness, MRI to be obtained as an     outpatient.  9. History of irritable bowel syndrome.  10.      Hypertension.  11.      Anxiety disorder.  12.      History of headaches with normal MRI in February of 2001.  13.      Status post cholecystectomy and tonsillectomy.  14.      Allergies to sulfa and Zoloft as well as Paxil, Provera and Librax.  15.      Reported history of renal artery stenosis with no evidence of renal     artery stenosis by catheterization in 2001.  16.      Possible history of intravenous dye reaction (patient unable to     remember type).  17.      History of allergies to amoxicillin and Cephalexin as well as     Darvocet.   DISCHARGE INSTRUCTIONS:  1. Her activity level is to be as tolerated.  2. She is to stick to a low-fat diet.  3. She is to follow up with Dr. Posey Rea and call for an appointment.  4. She is to follow up with Dr. Veneda Melter and call for an appointment.   DISCHARGE MEDICATIONS:  1. Protonix 40 mg q.d.  2. Inderal 160 mg q.d.  3. Lotensin 40 mg q.d.  4. Hydrochlorothiazide 25 mg q.d.  5. Xanax 0.5 mg t.i.d.  6.     _________ 0.625 mg q.d.  7. Coated aspirin 81 mg q.d.  8. Claritin 10 mg q.d.  Lavella Hammock, PA [facility]                    [phone]   RG/MEDQ  D:  04/29/2002  T:  05/01/2002  Job:  [id number]    cc:   Georgina Quint. Plotnikov, M.D. [facility]   Veneda Melter, M.D. [facility]

## 2011-01-28 NOTE — Cardiovascular Report (Signed)
NAME:  [name], KUPER                          ACCOUNT NO.:  [id number]   MEDICAL RECORD NO.:  [id number]                   PATIENT TYPE:  OIB   LOCATION:  2899                                 FACILITY:  [facility]   PHYSICIAN:  Carole Binning, M.D. [facility]         DATE OF BIRTH:  1951/06/07   DATE OF PROCEDURE:  DATE OF DISCHARGE:                              CARDIAC CATHETERIZATION   PROCEDURE PERFORMED:  1. Left heart catheterization with coronary angiography.  2. Intravascular ultrasound of the left circumflex coronary artery as part     of the Asteroid protocol.   INDICATION:  [name] is a 60 year old woman who has a history of mild  coronary artery disease.  She underwent cardiac catheterization two years  ago by Dr. Chales Abrahams.  She was enrolled in the Asteroid study and underwent  intravascular ultrasound of the left circumflex coronary artery at that  time.  She returns today for protocol followup and coronary angiography and  intravascular ultrasound.   PROCEDURE:  A 6 French sheath was placed in the right femoral artery.  Coronary angiography was performed with standard Judkins 6 French catheters.  Left ventricular pressure was measured with angled pigtail catheter.  Contrast was Omnipaque.  After completion of the coronary angiography we  proceeded with intravascular ultrasound of the left circumflex coronary  artery.  We used a 6 [place] CLS 3.5 guiding catheter.  A high-torque floppy  wire was advanced under fluoroscopic guidance into the distal left  circumflex.  An intravascular ultrasound catheter utilizing Clear View  Intravascular Ultrasound System was used.  The IVUS catheter was advanced  over the wire into distal left circumflex.  Intravascular ultrasound images  were then obtained and recorded on automatic pullback.  Intracoronary  nitroglycerin was administered prior to intravascular ultrasound.  Upon  completion of the intravascular ultrasound the catheter and  guidewire were  removed.  Angiographic images were repeated revealing no evidence of vessel  trauma.  At the conclusion of the procedure, an Angioseal vascular closure  device was placed in the right femoral artery with good hemostasis.   COMPLICATIONS:  None.   RESULTS/HEMODYNAMICS:  Left ventricular pressure 126/14, aortic pressure  138/82.  There was no aortic valve gradient.   Left ventriculography was not performed.   Coronary arteriography (codominant).   Left main is normal.   Left anterior descending artery has a tubular 40% stenosis in the mid  vessel.  The distal vessel has diffuse luminal irregularities.  The LAD  gives rise to a large first diagonal branch and a normal size second  diagonal branch.   Left circumflex is a codominant vessel.  There is a 25% stenosis in the  ostium and proximal circumflex.  Circumflex gives rise to a large branching  obtuse marginal and normal size first posterolateral branch, small second  posterolateral branch and a normal size third posterolateral branch.   Right coronary artery gives rise to a  normal size posterior descending  artery and two small posterolateral branches.  The right coronary artery is  normal.   Intravascular ultrasound of the left circumflex revealed mild  atherosclerotic disease of the proximal and ostial left circumflex which was  nonobstructive in nature.  There was otherwise no significant disease  identified.   CONCLUSIONS:  1. Normal left ventricular and E and D diastolic pressure.  2. Mild coronary artery disease as described, which is not hemodynamically     significant.  It does not appear to be changed significantly when     compared to a previous catheterization.   PLAN:  Continued medical therapy.                                               Carole Binning, M.D. [facility]    MWP/MEDQ  D:  04/06/2004  T:  04/06/2004  Job:  [id number]   cc:   Georgina Quint. Plotnikov, M.D. [facility]   [employer]

## 2011-01-28 NOTE — Op Note (Signed)
[place]  Patient:    [name], [name] Visit Number: [id number] MRN: [id number]          Service Type: MED Location: 2000 2030 01 Attending Physician:  Colon Branch Dictated by:   Hedwig Morton. Juanda Chance, M.D. [facility] Proc. Date: 02/20/02 Admit Date:  02/17/2002 Discharge Date: 02/21/2002   CC:         Theron Arista C. Eden Emms, M.D., [facility]   Operative Report  PROCEDURE:  Upper endoscopy.  ENDOSCOPIST:  Hedwig Morton. Juanda Chance, M.D. [facility]  INDICATIONS:  This 60 year old white female was admitted with chest pressure and underwent cardiac evaluation which showed positive Cardiolite scan which was apparently unchanged from previous scan and thought to be false positive. Also, internal medicine service has been consulted to assess etiology of her chest pain.  She does not give any clear symptoms of gastroesophageal reflux and denied any dysphagia or odynophagia.  She is now undergoing upper endoscopy to further evaluate her chest pain.  ENDOSCOPE:  Fujinon single-channel endoscope.  SEDATION:  Versed 10 mg IV.  DESCRIPTION OF PROCEDURE:  The Fujinon single-channel endoscope was passed under direct vision through the posterior pharynx into the esophagus.  The patient was monitored by pulse oximeter.  Her oxygen saturations were normal. Proximal and distal esophageal mucosa was normal.  Normal squamocolumnar junction.  There was a tiny hiatal hernia measuring about 1 cm which was not clinically significant.  The patient gagged during the procedure, and there was some prolapse of gastric mucosa into the esophagus, but this was not out of ordinary.  Stomach: The stomach was insufflated with air and showed normal-appearing gastric mucosa.  Body of the stomach and gastric antrum, no bile was noted. Retroflexion of the endoscope revealed normal fundus and cardia.  Pyloric outlet was normal.  Duodenum:  The duodenal bulb and descending duodenum were  unremarkable.  IMPRESSION:  Normal upper endoscopy of esophagus, stomach, and duodenum.  PLAN:  The patients chest pain or pressure is unclear as to the etiology. Normal upper endoscopy exam does not rule out symptomatic gastroesophageal reflux and, therefore, I would recommend the patient stay on Protonix 40 mg a day for at least the next 6 to 8 weeks to see how her pain develops or if it responds to the acid suppression. Dictated by:   Hedwig Morton. Juanda Chance, M.D. [facility] Attending Physician:  Colon Branch DD:  02/20/02 TD:  02/22/02 Job: [id number]

## 2011-01-28 NOTE — Letter (Signed)
May 02, 2006     Franco Collet  [address]  [place], [place]  [address]   RE:  JALEE, SAINE  MRN:  [id number]  /  DOB:  [birth year]   Dear Ms. Dayton Scrape:   I am writing this letter at Ms. Georgian Co request who has been a patient  of mine for a number of years.  She asked me to describe to you her medical  problems and medicines that she has been taking.  She has been suffering  with anxiety disorder, panic attacks, hypertension, coronary artery disease  and a back pain.  She had been under a fair amount of stress lately.  She  has been trying to get a disability and a Medicaid.  Currently, she is  taking Inderal LA 160 mg daily for her palpitations and Caresta 0.625 mg  daily for her hormone replacement, Xanax one 5 mg three times a day for  anxiety and panic attacks, HCTZ 25 mg for high blood pressure, Rosuvastatin  40 mg daily for her cholesterol, K-Dur 20 mEq for a supplement.  Lotensin 20  mg daily for her high blood pressure and aspirin 81 mg daily for her heart.   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  She is enjoying this job  and feels like she has enough energy to continue.  Please let me know if you  have any further questions.    Sincerely,     Sonda Primes, MD   AP/MedQ  DD:  05/02/2006  DT:  05/03/2006  Job #:  [id number]

## 2011-02-09 ENCOUNTER — Other Ambulatory Visit: Payer: Self-pay | Admitting: Cardiology

## 2011-02-09 DIAGNOSIS — I10 Essential (primary) hypertension: Secondary | ICD-10-CM

## 2011-02-09 MED ORDER — VALSARTAN 160 MG PO TABS: 160.0000 mg | ORAL_TABLET | Freq: Every day | ORAL | Status: DC

## 2011-02-09 MED ORDER — BISOPROLOL FUMARATE 5 MG PO TABS: 5.0000 mg | ORAL_TABLET | Freq: Two times a day (BID) | ORAL | Status: DC

## 2011-02-17 ENCOUNTER — Ambulatory Visit (INDEPENDENT_AMBULATORY_CARE_PROVIDER_SITE_OTHER): Payer: Medicare Other | Admitting: Psychiatry

## 2011-02-17 DIAGNOSIS — F0630 Mood disorder due to known physiological condition, unspecified: Secondary | ICD-10-CM

## 2011-02-17 DIAGNOSIS — F410 Panic disorder [episodic paroxysmal anxiety] without agoraphobia: Secondary | ICD-10-CM

## 2011-03-02 ENCOUNTER — Ambulatory Visit (INDEPENDENT_AMBULATORY_CARE_PROVIDER_SITE_OTHER): Payer: Medicare Other | Admitting: Psychiatry

## 2011-03-02 DIAGNOSIS — F0630 Mood disorder due to known physiological condition, unspecified: Secondary | ICD-10-CM

## 2011-03-02 DIAGNOSIS — F410 Panic disorder [episodic paroxysmal anxiety] without agoraphobia: Secondary | ICD-10-CM

## 2011-03-03 ENCOUNTER — Ambulatory Visit: Payer: Medicare Other | Admitting: Psychiatry

## 2011-03-03 ENCOUNTER — Encounter: Payer: Self-pay | Admitting: Adult Health

## 2011-03-03 ENCOUNTER — Ambulatory Visit (INDEPENDENT_AMBULATORY_CARE_PROVIDER_SITE_OTHER): Payer: Medicare Other | Admitting: Adult Health

## 2011-03-03 VITALS — BP 136/78 | HR 69 | Temp 97.4°F | Ht 63.0 in | Wt 167.4 lb

## 2011-03-03 DIAGNOSIS — J45909 Unspecified asthma, uncomplicated: Secondary | ICD-10-CM

## 2011-03-03 MED ORDER — PANTOPRAZOLE SODIUM 40 MG PO TBEC: 40.0000 mg | DELAYED_RELEASE_TABLET | Freq: Every day | ORAL | Status: DC

## 2011-03-03 MED ORDER — DOXYCYCLINE HYCLATE 100 MG PO TABS: 100.0000 mg | ORAL_TABLET | Freq: Two times a day (BID) | ORAL | Status: AC

## 2011-03-03 NOTE — Patient Instructions (Addendum)
Doxycycline 100mg  Twice daily  For 7 days Increase Symbicort 2 puffs Twice daily  -brush/rinse and gargle after use  Restart Protonix 40mg  daily before meal  Restart Pepcid 20mg  At bedtime   Delsym 2 tsp Twice daily  As needed  Cough Please contact office for sooner follow up if symptoms do not improve or worsen or seek emergency care  follow up Dr. Sherene Sires  4 weeks

## 2011-03-03 NOTE — Progress Notes (Signed)
Subjective:    Patient ID: [name], female    DOB: [birth year], 60 y.o.   MRN: [id number]  HPI 35 yowm quit smoking around 1985 with no respirartory problems at all.   July 16, 2010 1st pulmonary eval in EMR cc sob and refractory cough and wheeze since 05/2010, never previously had any fall allergies. See inpt eval with imp pseudoasthma, rec d/c ACE and and Inderal   August 18, 2010 ov cough much better off ace, no need for saba at all now, still not satisfied throat irritation better. rec Work on inhaler technique Flonase has no immediate benefit in terms of improving symptoms.  Prilosec 20 mg Take one 30-60 min before first meal of the day and pecid 20 mg one at bedtime  Please schedule a follow-up appointment in 6 weeks, sooner if needed  Think of your medications in 3 separate categories and keep them separate AND BRING THEM WITH YOU TO [facility] OFFICE VISIT  a) The ones you take no matter what daily on a scheduled basis these belong in a pillbox but also include flonase  b) The ones you only take if needed for specific problems  c) The ones you take for a short course and stop, like antibiotics and prednisone.  GERD (REFLUX) diet   October 01, 2010 ov cc cough resolved but still feels she needs the saba up to 3 times esp with activities like going to the dumpster and back - not clear whether sitting and resting works just as well to relieve sob. did not bring meds in separate bags as requested. count on dulera is way off > could not tolerate taking it regularly due to nerves. rec no change in rx  November 19, 2010 ov cc Cough- returned approx 4 days ago using ventolin after lie down helps x hours but using it up to 4 x daily also. no excess sputum. sob worse with ex but resolves s needing saba. rec 1) Work on inhaler technique: relax and blow all the way out then take a nice smooth deep breath back in, triggering the inhaler at same time you start breathing in  2) Symbicort 160 1 puffs  first thing in am and 1 puffs again in pm about 12 hours later if needed  3) Protonix 40 mg Take one 30-60 min before first meal of the day and Pepcid 20 mg one at bedtime  4) Stop dulera  5) GERD (REFLUX) diet  12/31/2010 ov /Wert/ final summary:  All smiles, no cough, sob,  rarely needs symbicort more than once or twice a week and no saba at all.   03/03/11 Acute OV  Pt presents for an acute office visit. Pt complains of asthma flare x1week with prod cough with milky-colored mucus, wheezing, increased SOB, tightness in chest.  Pt recently had breast reconstruction surgery 01/24/11 from previous breast cancer in past. Has been very sore from this. Is still on light duty. Last visit she was doing very well with rare cough, She taperd off off PPI, Symbicort and Pepcid as recommended last ov. She has restarted Symbicort once daily . Cough and wheezing are getting worse. Has used xopenex neb few times over last week. No chest pain , fever or hemoptysis      Allergies   1) ! Penicillin V Potassium (Penicillin V Potassium)  2) ! Covera-Hs (Verapamil Hcl)  3) ! Zoloft (Sertraline Hcl)  4) ! Librax (Clidinium-Chlordiazepoxide)  5) ! Paxil (Paroxetine Hcl)  6) ! Sulfadiazine (  Sulfadiazine)  7) ! Cephalexin (Cephalexin)  8) ! Amoxicillin (Amoxicillin)  9) ! Bromfenex  10) ! * Tequin  11) ! Latex  12) ! Darvocet  13) ! 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 To have port-a-cath placed for chemo. S/p chemo /radiation s/p lumpectomy  -right  S/p reconstruction surgery 01/24/11  DEPRESSION (ICD-311)bipolar (?)  VIRAL INFECTION (ICD-079.99)  ADENOCARCINOMA, RIGHT BREAST (ICD-174.9)  DIVERTICULOSIS, COLON (ICD-562.10)  CONSTIPATION (ICD-564.00)  GASTROESOPHAGEAL REFLUX DISEASE (ICD-530.81)  BIPOLAR NOS (ICD-296.80)  LOW BACK PAIN (ICD-724.2)  ANXIETY (ICD-300.00)     Review of Systems Constitutional:   No  weight loss, night sweats,  Fevers, chills, fatigue, or  lassitude.  HEENT:   No headaches,  Difficulty swallowing,  Tooth/dental problems, or  Sore throat,                No sneezing, itching, ear ache, nasal congestion, post nasal drip,   CV:  No chest pain,  Orthopnea, PND, swelling in lower extremities, anasarca, dizziness, palpitations, syncope.   GI  No heartburn, indigestion, abdominal pain, nausea, vomiting, diarrhea, change in bowel habits, loss of appetite, bloody stools.   Resp:    No coughing up of blood.  No chest wall deformity  Skin: no rash or lesions.  GU: no dysuria, change in color of urine, no urgency or frequency.  No flank pain, no hematuria   MS:  No joint pain or swelling.  No decreased range of motion.   Psych:  No change in mood or affect. No depression or anxiety.  .         Objective:   Physical Exam GEN: A/Ox3; pleasant , NAD   HEENT:  [place]/AT,  EACs-clear, TMs-wnl, NOSE-clear, THROAT-clear, no lesions, no postnasal drip or exudate noted.   NECK:  Supple w/ fair ROM; no JVD; normal carotid impulses w/o bruits; no thyromegaly or nodules palpated; no lymphadenopathy.  RESP  Clear  P & A; w/o, wheezes/ rales/ or rhonchi.no accessory muscle use, no dullness to percussion  CARD:  RRR, no m/r/g  , no peripheral edema, pulses intact, no cyanosis or clubbing.  GI:   Soft & nt; nml bowel sounds; no organomegaly or masses detected.  Musco: Warm bil, no deformities or joint swelling noted.   Neuro: alert, no focal deficits noted.    Skin: Warm, no lesions or rashes          Assessment & Plan:

## 2011-03-03 NOTE — Assessment & Plan Note (Addendum)
Flare with associated URI-   Plan:  Doxycycline 100mg  Twice daily  For 7 days Increase Symbicort 2 puffs Twice daily  -brush/rinse and gargle after use  Restart Protonix 40mg  daily before meal  Restart Pepcid 20mg  At bedtime   Delsym 2 tsp Twice daily  As needed  Cough Please contact office for sooner follow up if symptoms do not improve or worsen or seek emergency care  follow up Dr. Sherene Sires  4 weeks

## 2011-03-05 ENCOUNTER — Emergency Department (HOSPITAL_COMMUNITY): Payer: Medicare Other

## 2011-03-05 ENCOUNTER — Emergency Department (HOSPITAL_COMMUNITY)
Admission: EM | Admit: 2011-03-05 | Discharge: 2011-03-05 | Disposition: A | Discharge status: Home / Self Care | Payer: Medicare Other | Attending: Emergency Medicine | Admitting: Emergency Medicine

## 2011-03-05 DIAGNOSIS — K209 Esophagitis, unspecified without bleeding: Secondary | ICD-10-CM | POA: Insufficient documentation

## 2011-03-05 DIAGNOSIS — F3289 Other specified depressive episodes: Secondary | ICD-10-CM | POA: Insufficient documentation

## 2011-03-05 DIAGNOSIS — R05 Cough: Secondary | ICD-10-CM | POA: Insufficient documentation

## 2011-03-05 DIAGNOSIS — F329 Major depressive disorder, single episode, unspecified: Secondary | ICD-10-CM | POA: Insufficient documentation

## 2011-03-05 DIAGNOSIS — I252 Old myocardial infarction: Secondary | ICD-10-CM | POA: Insufficient documentation

## 2011-03-05 DIAGNOSIS — Z853 Personal history of malignant neoplasm of breast: Secondary | ICD-10-CM | POA: Insufficient documentation

## 2011-03-05 DIAGNOSIS — R059 Cough, unspecified: Secondary | ICD-10-CM | POA: Insufficient documentation

## 2011-03-05 DIAGNOSIS — I2510 Atherosclerotic heart disease of native coronary artery without angina pectoris: Secondary | ICD-10-CM | POA: Insufficient documentation

## 2011-03-05 DIAGNOSIS — K2990 Gastroduodenitis, unspecified, without bleeding: Secondary | ICD-10-CM | POA: Insufficient documentation

## 2011-03-05 DIAGNOSIS — K2970 Gastritis, unspecified, without bleeding: Secondary | ICD-10-CM | POA: Insufficient documentation

## 2011-03-05 DIAGNOSIS — R079 Chest pain, unspecified: Secondary | ICD-10-CM | POA: Insufficient documentation

## 2011-03-05 DIAGNOSIS — R109 Unspecified abdominal pain: Secondary | ICD-10-CM | POA: Insufficient documentation

## 2011-03-05 DIAGNOSIS — I10 Essential (primary) hypertension: Secondary | ICD-10-CM | POA: Insufficient documentation

## 2011-03-05 LAB — CK TOTAL AND CKMB (NOT AT ARMC)
CK, MB: 2.3 ng/mL (ref 0.3–4.0)
CK, MB: 2.1 ng/mL (ref 0.3–4.0)
Relative Index: INVALID (ref 0.0–2.5)
Relative Index: INVALID (ref 0.0–2.5)
Total CK: 80 U/L (ref 7–177)

## 2011-03-05 LAB — TROPONIN I
Troponin I: <0.3 ng/mL (ref <0.30)
Troponin I: <0.3 ng/mL (ref <0.30)

## 2011-03-05 LAB — CBC
HCT: 38.6 % (ref 36.0–46.0)
Hemoglobin: 12.6 g/dL (ref 12.0–15.0)
MCHC: 32.6 g/dL (ref 30.0–36.0)
RBC: 4.33 MIL/uL (ref 3.87–5.11)
WBC: 5.1 10*3/uL (ref 4.0–10.5)

## 2011-03-05 LAB — DIFFERENTIAL
Basophils Absolute: 0 10*3/uL (ref 0.0–0.1)
Basophils Relative: 0 % (ref 0–1)
Lymphocytes Relative: 42 % (ref 12–46)
Monocytes Absolute: 0.4 10*3/uL (ref 0.1–1.0)
Neutro Abs: 2.4 10*3/uL (ref 1.7–7.7)
Neutrophils Relative %: 48 % (ref 43–77)

## 2011-03-05 LAB — COMPREHENSIVE METABOLIC PANEL
ALT: 11 U/L (ref 0–35)
AST: 17 U/L (ref 0–37)
Albumin: 4.1 g/dL (ref 3.5–5.2)
Alkaline Phosphatase: 94 U/L (ref 39–117)
Calcium: 10.4 mg/dL (ref 8.4–10.5)
GFR calc Af Amer: >60 mL/min (ref >60)
Glucose, Bld: 121 mg/dL — ABNORMAL HIGH (ref 70–99)
Potassium: 3.1 mEq/L — ABNORMAL LOW (ref 3.5–5.1)
Sodium: 140 mEq/L (ref 135–145)
Total Protein: 6.8 g/dL (ref 6.0–8.3)

## 2011-03-05 LAB — URINALYSIS, ROUTINE W REFLEX MICROSCOPIC
Bilirubin Urine: NEGATIVE
Nitrite: NEGATIVE
Specific Gravity, Urine: 1.006 (ref 1.005–1.030)
Urobilinogen, UA: 0.2 mg/dL (ref 0.0–1.0)
pH: 7 (ref 5.0–8.0)

## 2011-03-09 ENCOUNTER — Encounter: Payer: Self-pay | Admitting: Internal Medicine

## 2011-03-10 ENCOUNTER — Encounter: Payer: Self-pay | Admitting: Internal Medicine

## 2011-03-10 ENCOUNTER — Ambulatory Visit (INDEPENDENT_AMBULATORY_CARE_PROVIDER_SITE_OTHER): Payer: Medicare Other | Admitting: Internal Medicine

## 2011-03-10 VITALS — BP 140/86 | HR 69 | Temp 98.1°F | Ht 64.0 in | Wt 165.0 lb

## 2011-03-10 DIAGNOSIS — J45909 Unspecified asthma, uncomplicated: Secondary | ICD-10-CM

## 2011-03-10 NOTE — Assessment & Plan Note (Signed)
Symptoms are markedly disproportionate to objective findings and not clear this is a lung problem but pt does appear to have difficult airway management issues.  DDX of  difficult airways managment all start with A and  include Adherence, Ace Inhibitors, Acid Reflux, Active Sinus Disease, Alpha 1 Antitripsin deficiency, Anxiety masquerading as Airways dz,  ABPA,  allergy(esp in young), Aspiration (esp in elderly), Adverse effects of DPI,  Active smokers, plus two Bs  = Bronchiectasis and Beta blocker use..and one C= CHF  Adherence is always the initial "prime suspect" and is a multilayered concern that requires a "trust but verify" approach in every patient - starting with knowing how to use medications, especially inhalers, correctly, keeping up with refills and understanding the fundamental difference between maintenance and prns vs those medications only taken for a very short course and then stopped and not refilled.   See instructions for specific recommendations which were reviewed directly with the patient who was given a copy with highlighter outlining the key components.  Acid reflux appears to be a major trigger for her atypical asthma symptoms. See instructions for specific recommendations which were reviewed directly with the patient who was given a copy with highlighter outlining the key components.   The proper method of use, as well as anticipated side effects, of this metered-dose inhaler are discussed and demonstrated to the patient. Improved to 75% with coaching

## 2011-03-10 NOTE — Patient Instructions (Addendum)
Protonix 40 mg 30-60 min before bfast daily and pepcid 20 mg at bedtime and call for GI referral if not better.  GERD (REFLUX)  is an extremely common cause of respiratory symptoms, many times with no significant heartburn at all.    It can be treated with medication, but also with lifestyle changes including avoidance of late meals, excessive alcohol, smoking cessation, and avoid fatty foods, chocolate, peppermint, colas, red wine, and acidic juices such as orange juice.  NO MINT OR MENTHOL PRODUCTS SO NO COUGH DROPS  USE SUGARLESS CANDY INSTEAD (jolley ranchers or Stover's)  NO OIL BASED VITAMINS   Every visit you need to bring every medication  Resume symbicort 160 Take 2 puffs first thing in am and then another 2 puffs about 12 hours later if having any respiratory problems    If you are satisfied with your treatment plan let your doctor know and he/she can either refill your medications or you can return here when your prescription runs out.     If in any way you are not 100% satisfied,  please tell us.  If 100% better, tell your friends!

## 2011-03-10 NOTE — Progress Notes (Signed)
Subjective:    Patient ID: [name], female    DOB: 11-20-1950, 60 y.o.   MRN: [id number]  HPI 24 yowm quit smoking around 1985 with no respirartory problems at all.   July 16, 2010 1st pulmonary eval in EMR cc sob and refractory cough and wheeze since 05/2010, never previously had any fall allergies. See inpt eval with imp pseudoasthma, rec d/c ACE and and Inderal   August 18, 2010 ov cough much better off ace, no need for saba at all now, still not satisfied throat irritation better. rec Work on inhaler technique Flonase has no immediate benefit in terms of improving symptoms.  Prilosec 20 mg Take one 30-60 min before first meal of the day and pecid 20 mg one at bedtime  Please schedule a follow-up appointment in 6 weeks, sooner if needed  Think of your medications in 3 separate categories and keep them separate AND BRING THEM WITH YOU TO [facility] OFFICE VISIT  a) The ones you take no matter what daily on a scheduled basis these belong in a pillbox but also include flonase  b) The ones you only take if needed for specific problems  c) The ones you take for a short course and stop, like antibiotics and prednisone.  GERD (REFLUX) diet   October 01, 2010 ov cc cough resolved but still feels she needs the saba up to 3 times esp with activities like going to the dumpster and back - not clear whether sitting and resting works just as well to relieve sob. did not bring meds in separate bags as requested. count on dulera is way off > could not tolerate taking it regularly due to nerves. rec no change in rx  November 19, 2010 ov cc Cough- returned approx 4 days ago using ventolin after lie down helps x hours but using it up to 4 x daily also. no excess sputum. sob worse with ex but resolves s needing saba. rec 1) Work on inhaler technique: relax and blow all the way out then take a nice smooth deep breath back in, triggering the inhaler at same time you start breathing in  2) Symbicort 160 1 puffs  first thing in am and 1 puffs again in pm about 12 hours later if needed  3) Protonix 40 mg Take one 30-60 min before first meal of the day and Pepcid 20 mg one at bedtime  4) Stop dulera  5) GERD (REFLUX) diet  12/31/2010 ov /[name]/ final summary:  All smiles, no cough, sob,  rarely needs symbicort more than once or twice a week and no saba at all.    rec Only use  symbicort 160 if needed up to 1-2 puffs every 12 hours and if you use the max dose and are not happy call immediately for evaluation.  Stop pepcid first and if no increase in cough then stop protonix 2 weeks later and if resp symptoms flare you need to restart them  GERD (REFLUX) diet reviewed    01/24/11  Major reconstructive surgery under general anesthesia Dr Shon Hough then w/in 2 weeks started needing rescue inhaler   03/03/11 Acute OV  Pt presents for an acute office visit. Pt complains of asthma flare x1week with prod cough with milky-colored mucus, wheezing, increased SOB, tightness in chest.  Pt recently had breast reconstruction surgery 01/24/11 from previous breast cancer in past. Has been very sore from this. Is still on light duty. Last visit she was doing very well with rare  cough, She taperd off off PPI, Symbicort and Pepcid as recommended last ov. She has restarted Symbicort once daily .   03/10/2011  Ov/ [name] confused with med names/ instructions, did not follow any of the contingency instructions from prev ov and started using rescue rx without symbicort.  No purulent sputum.  Pt denies any significant sore throat, dysphagia, itching, sneezing,  nasal congestion or excess/ purulent secretions,  fever, chills, sweats, unintended wt loss, pleuritic or exertional cp, hempoptysis, orthopnea pnd or leg swelling.    Also denies any obvious fluctuation of symptoms with weather or environmental changes or other aggravating or alleviating factors.      Allergies   1) ! Penicillin V Potassium (Penicillin V Potassium)  2) !  Covera-Hs (Verapamil Hcl)  3) ! Zoloft (Sertraline Hcl)  4) ! Librax (Clidinium-Chlordiazepoxide)  5) ! Paxil (Paroxetine Hcl)  6) ! Sulfadiazine (Sulfadiazine)  7) ! Cephalexin (Cephalexin)  8) ! Amoxicillin (Amoxicillin)  9) ! Bromfenex  10) ! * Tequin  11) ! Latex  12) ! Darvocet  13) ! 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 To have port-a-cath placed for chemo. S/p chemo /radiation s/p lumpectomy -right  S/p reconstruction surgery 01/24/11  DEPRESSION (ICD-311)bipolar (?)  VIRAL INFECTION (ICD-079.99)  ADENOCARCINOMA, RIGHT BREAST (ICD-174.9)  DIVERTICULOSIS, COLON (ICD-562.10)  CONSTIPATION (ICD-564.00)  GASTROESOPHAGEAL REFLUX DISEASE (ICD-530.81)  BIPOLAR NOS (ICD-296.80)  LOW BACK PAIN (ICD-724.2)  ANXIETY (ICD-300.00)               Objective:   Physical Exam GEN: amb wf unusual affect who   failed to answer a single question asked in a straightforward manner, tending to go off on tangents or answer questions with ambiguous medical terms or diagnoses and seemed aggravated  when asked the same question more than once for clarification.   HEENT:  [place]/AT,  EACs-clear, TMs-wnl, NOSE-clear, THROAT-clear, no lesions, no postnasal drip or exudate noted.   NECK:  Supple w/ fair ROM; no JVD; normal carotid impulses w/o bruits; no thyromegaly or nodules palpated; no lymphadenopathy.  RESP  Clear  P & A; w/o, wheezes/ rales/ or rhonchi.no accessory muscle use, no dullness to percussion  CARD:  RRR, no m/r/g   , no peripheral edema, pulses intact, no cyanosis or clubbing.  GI:   Soft & nt; nml bowel sounds; no organomegaly or masses detected.  Musco: Warm bil, no deformities or joint swelling noted.   Neuro: alert, no focal deficits noted.    Skin: Warm, no lesions or rashes         Assessment & Plan:

## 2011-03-15 ENCOUNTER — Telehealth: Payer: Self-pay | Admitting: Gastroenterology

## 2011-03-15 ENCOUNTER — Telehealth: Payer: Self-pay | Admitting: Internal Medicine

## 2011-03-15 DIAGNOSIS — R197 Diarrhea, unspecified: Secondary | ICD-10-CM

## 2011-03-15 DIAGNOSIS — R195 Other fecal abnormalities: Secondary | ICD-10-CM

## 2011-03-15 DIAGNOSIS — R634 Abnormal weight loss: Secondary | ICD-10-CM

## 2011-03-15 NOTE — Telephone Encounter (Signed)
Spoke with pt and notified referral to GI has been made. Pt verbalized understanding.

## 2011-03-15 NOTE — Telephone Encounter (Signed)
Order placed -- [facility]

## 2011-03-15 NOTE — Telephone Encounter (Signed)
Ok to refer to GI

## 2011-03-15 NOTE — Telephone Encounter (Signed)
Called, spoke with pt.  States after eating it is no more than 10 min and she has to go to the bathroom --having loose stools.  States this is worse since visits with MW.  Pt states she is also loosing weight -- has lost almost 14 lbs "since this started."  States MW had mentioned she will need to see a GI dr -- requesting he refer her to Dr. Eloise Harman who she has seen in the past.  She is aware he is out of office until Thursday and ok with  This.  Dr. Sherene Sires, pls advise if GI referral ok.  Thanks!

## 2011-03-17 ENCOUNTER — Other Ambulatory Visit: Payer: Medicare Other

## 2011-03-17 ENCOUNTER — Telehealth: Payer: Self-pay | Admitting: Physician Assistant

## 2011-03-17 ENCOUNTER — Ambulatory Visit (INDEPENDENT_AMBULATORY_CARE_PROVIDER_SITE_OTHER): Payer: Medicare Other | Admitting: Physician Assistant

## 2011-03-17 ENCOUNTER — Ambulatory Visit: Payer: Medicare Other | Admitting: Psychiatry

## 2011-03-17 ENCOUNTER — Encounter: Payer: Self-pay | Admitting: Physician Assistant

## 2011-03-17 ENCOUNTER — Other Ambulatory Visit (INDEPENDENT_AMBULATORY_CARE_PROVIDER_SITE_OTHER): Payer: Medicare Other

## 2011-03-17 VITALS — BP 112/76 | HR 68 | Ht 64.0 in | Wt 163.0 lb

## 2011-03-17 DIAGNOSIS — R634 Abnormal weight loss: Secondary | ICD-10-CM

## 2011-03-17 DIAGNOSIS — R1084 Generalized abdominal pain: Secondary | ICD-10-CM

## 2011-03-17 DIAGNOSIS — R197 Diarrhea, unspecified: Secondary | ICD-10-CM

## 2011-03-17 DIAGNOSIS — R6889 Other general symptoms and signs: Secondary | ICD-10-CM

## 2011-03-17 LAB — CBC WITH DIFFERENTIAL/PLATELET
Eosinophils Relative: 0.5 % (ref 0.0–5.0)
HCT: 37.7 % (ref 36.0–46.0)
Hemoglobin: 12.9 g/dL (ref 12.0–15.0)
Lymphocytes Relative: 23.7 % (ref 12.0–46.0)
Lymphs Abs: 1.3 10*3/uL (ref 0.7–4.0)
Monocytes Relative: 7.8 % (ref 3.0–12.0)
Neutro Abs: 3.6 10*3/uL (ref 1.4–7.7)
RBC: 4.14 Mil/uL (ref 3.87–5.11)
WBC: 5.4 10*3/uL (ref 4.5–10.5)

## 2011-03-17 MED ORDER — DICYCLOMINE HCL 10 MG PO CAPS: ORAL_CAPSULE | ORAL | Status: DC

## 2011-03-17 MED ORDER — ALIGN 4 MG PO CAPS: 1.0000 | ORAL_CAPSULE | Freq: Every day | ORAL | Status: DC

## 2011-03-17 NOTE — Progress Notes (Signed)
Topographical  errors, in plan of treatment : Align, ? Bentyl. I agree with plan

## 2011-03-17 NOTE — Telephone Encounter (Signed)
Pt brought 2 stool samples in this afternoon and was going to bring 2 samples  Tomorrow the 6th. She was concerned because when she brought the stool back today, someone told her to bring all 4 in at the same time from the same BM.  I called the lab and was told by someone there that checked with 2 other people that the pt is doing the stool collection correctly and can bring 2 samples today and 2 tomorrow.

## 2011-03-17 NOTE — Progress Notes (Signed)
Subjective:    Patient ID: [name], female    DOB: 1951-06-01, 60 y.o.   MRN: [id number]  HPI  [name] is a very nice 60 year old white female, known remotely to Dr. Jarold Motto who has not been seen since 2003. She has history of GERD and diverticulosis, and IBS. Her last endoscopy and colonoscopy were in 2001. She does have a generalized anxiety disorder and history of asthma, and is currently dealing with chronic back pain for which she is to have a spinal block within the next couple of weeks. She has also had recent breast reconstruction for breast cancer.  She comes in today after an ER visit on June 23 of when she had an asthma attack which she says was associated with a lot of hard coughing and then eventually developed acid reflux symptoms with choking and production of a lot of for sputum and phlegm. She had labs and chest x-ray done in the emergency room which were unremarkable and  was started on Protonix 40 mg daily in the morning and has also been taking Pepcid 20 mg at bedtime. She says over the past week or so she has improved although she still having some symptoms. She denies any dysphagia or a or odynophagia. She is also concerned because she has been having other GI symptoms over the past couple of months which have progressed and got worse over the past 2 weeks. She describes crampy abdominal pain in the upper abdomen associated with swelling and urgency postprandially. She says she was having symptoms to the point where every time she ate anything she was having urgency and the need for a bowel movement within 10-15 minutes after eating. She says her stools were light and soft but not really diarrhea. She has not seen any blood. At this point she is unable to tolerate anything other than a Bratt diet because it causes her to have about a pain and urgency. She has lost about 15 pounds over the past 2 months. She has not had any fever or chills but she is aware of, the only new medication  was a course of doxycycline after a breast reconstruction for breast cancer in mid May and then had another course of doxycycline in June for a sinus infection. She does not take any NSAIDs had been taking a baby aspirin which was held when she went to the emergency room.    Review of Systems  Constitutional: Positive for appetite change and unexpected weight change.  HENT: Negative.   Eyes: Negative.   Respiratory: Positive for cough.   Cardiovascular: Negative.   Gastrointestinal: Positive for abdominal pain and diarrhea.  Genitourinary: Negative.   Musculoskeletal: Negative.   Skin: Negative.   Neurological: Negative.   Hematological: Negative.   Psychiatric/Behavioral: Negative.    Outpatient Prescriptions Prior to Visit  Medication Sig Dispense Refill  . ALPRAZolam (XANAX) 1 MG tablet 1/2 to 1 tablet 3 times daily as needed      . aluminum-magnesium hydroxide 200-200 MG/5ML suspension Take as directed daily       . Ascorbic Acid (VITAMIN C) 500 MG tablet Take 500 mg by mouth daily.        . bisoprolol (ZEBETA) 5 MG tablet Take 1 tablet (5 mg total) by mouth 2 (two) times daily.  60 tablet  3  . budesonide-formoterol (SYMBICORT) 160-4.5 MCG/ACT inhaler Inhale 1 puff into the lungs at bedtime.       . Calcium Carbonate-Vitamin D (CALCIUM-CARB 600 + D)  600-125 MG-UNIT TABS Take 1 capsule by mouth daily.        . cetirizine (ZYRTEC) 10 MG tablet Take 10 mg by mouth at bedtime as needed.        . Cholecalciferol (VITAMIN D3) 400 UNITS CAPS Take 1 capsule by mouth daily.        . cycloSPORINE (RESTASIS) 0.05 % ophthalmic emulsion Place 1 drop into both eyes 2 (two) times daily.        Marland Kitchen docusate sodium (COLACE) 50 MG capsule Take by mouth 2 (two) times daily.        . famotidine (PEPCID) 20 MG tablet Take 20 mg by mouth daily.        . fluticasone (FLONASE) 50 MCG/ACT nasal spray 2 sprays by Nasal route at bedtime as needed.        . furosemide (LASIX) 20 MG tablet Take 20 mg by mouth  daily.        . nitroGLYCERIN (NITROSTAT) 0.4 MG SL tablet Place 0.4 mg under the tongue every 5 (five) minutes as needed.        . pantoprazole (PROTONIX) 40 MG tablet Take 1 tablet (40 mg total) by mouth daily before breakfast.  30 tablet  5  . pravastatin (PRAVACHOL) 20 MG tablet Take 20 mg by mouth daily.        . valsartan (DIOVAN) 160 MG tablet Take 1 tablet (160 mg total) by mouth daily.  30 tablet  3  . acetaminophen (TYLENOL) 500 MG tablet Take 500 mg by mouth every 6 (six) hours as needed.        Marland Kitchen aspirin 81 MG tablet Take 81 mg by mouth daily.        . psyllium (METAMUCIL) 58.6 % powder Take 1 packet by mouth as directed.             Objective:   Physical Exam Well-developed white female in no acute distress, pleasant, alert and oriented x3 HEENT; nontraumatic normocephalic EOMI PERRLA sclera anicteric  Neck; supple no JVD, Cardiovascular; regular rate and rhythm with S1-S2 no murmur rub or gallop  Pulmonary; clear bilaterally  Abdomen; soft tender in the upper abdomen epigastrium right upper quadrant right mid quadrant right lower quadrant no guarding no rebound no palpable mass or hepatosplenomegaly bowel sounds are active, Rectal; exam not done, Extremities; is benign skin warm and dry  Psych;anxios but otherwise appropriate.        Assessment & Plan:  #70 60 year old female with exacerbation of GERD, improving  Plan; Continue Protonix 40 mg daily in a.m. and Pepcid 20 mg each bedtime x30 days and if symptoms resolved may stop the Pepcid. Restart coated baby aspirin once daily  #2 Two-month history of upper abdominal pain cramping bloating and altered bowel habits with urgency postprandially and weight loss of 15 pounds. Etiology is not clear though chronologically seems to coincide with her breast reconstruction surgery and first course of doxycycline. Suspect antibiotic associated exacerbation of IBS, cannot rule out gastritis or other infectious colitis i.e. C.  difficile.  Plan; check CBC and sedimentation rate today Stool for culture, C. difficile PCR, lactoferrin, O. and P. Start a line one daily Start that until 10 mg 3 times daily 15 minutes before meals Further workup pending results of above  #3 Diverticulosis  #4 asthma  #5 Coronary artery disease status post MI  #6 Recent diagnosis of breast cancer.

## 2011-03-17 NOTE — Patient Instructions (Signed)
Please go to the basement level to have your labs drawn. Stay on a bland diet at the present time. We have sent a prescription for Bentyl ( Dicyclomine) to [facility]. Continue the Protonix 40 mg 30 min before breakfast. Take Pepcid AC 20 mg at bedtime for 1 month.

## 2011-03-17 NOTE — Telephone Encounter (Signed)
Pt with GI hx of Diverticulitis, Anal Fissure and Constipation. Pt has not seen [place] since [facility] with Dr Jarold Motto in 10/2000 showing Diverticulosis in Sigmoid-suspect IBS and the EGD was normal, again Dr Jarold Motto suspects IBS. She went to the ER on 03/05/11 for chest pain and was placed on Protonix. She saw Dr Sherene Sires on 03/10/11 and was to take Protonix qam and Pepcid QHS. Spoke with pt this am who reports she has a stool as soon as she eats no matter what she eats. She also reports bloating and upper abdominal pain . She stated she can have as many as 8-9 stools daily that are semi-formed and she reports light in color. She will bring copies of her records from Dr Benay Pike her "spine specialist". Pt will see Mike Gip, PA at 10am today.

## 2011-03-18 ENCOUNTER — Other Ambulatory Visit: Payer: Medicare Other

## 2011-03-18 DIAGNOSIS — R197 Diarrhea, unspecified: Secondary | ICD-10-CM

## 2011-03-18 DIAGNOSIS — R1084 Generalized abdominal pain: Secondary | ICD-10-CM

## 2011-03-18 DIAGNOSIS — R634 Abnormal weight loss: Secondary | ICD-10-CM

## 2011-03-18 LAB — GIARDIA/CRYPTOSPORIDIUM (EIA)
Cryptosporidium Screen (EIA): NEGATIVE
Giardia Screen (EIA): NEGATIVE

## 2011-03-19 LAB — FECAL LACTOFERRIN, QUANT: Lactoferrin: NEGATIVE

## 2011-03-21 ENCOUNTER — Ambulatory Visit (INDEPENDENT_AMBULATORY_CARE_PROVIDER_SITE_OTHER): Payer: Medicare Other | Admitting: Psychiatry

## 2011-03-21 ENCOUNTER — Telehealth: Payer: Self-pay | Admitting: *Deleted

## 2011-03-21 DIAGNOSIS — F410 Panic disorder [episodic paroxysmal anxiety] without agoraphobia: Secondary | ICD-10-CM

## 2011-03-21 DIAGNOSIS — F0630 Mood disorder due to known physiological condition, unspecified: Secondary | ICD-10-CM

## 2011-03-21 LAB — STOOL CULTURE

## 2011-03-21 NOTE — Telephone Encounter (Signed)
Left a message for patient at her home number to call me.

## 2011-03-21 NOTE — Telephone Encounter (Signed)
Patient given results. She is feeling better but still has same pain when she bends over and her stomach is still swelling.

## 2011-03-21 NOTE — Telephone Encounter (Signed)
Message copied by Daphine Deutscher on Mon Mar 21, 2011  3:40 PM ------      Message from: [place], [place] S      Created: Mon Mar 21, 2011  3:35 PM       Please let pt know that stool studies are negative, labs are normal... How is she feeling?

## 2011-03-22 ENCOUNTER — Ambulatory Visit: Payer: Medicare Other | Admitting: Psychiatry

## 2011-03-22 ENCOUNTER — Telehealth: Payer: Self-pay | Admitting: Physician Assistant

## 2011-03-22 NOTE — Telephone Encounter (Signed)
Patient instructed to continue as Mike Gip, PA instructed her too at visit. Patient states the Dicyclomine is causing "thrush'" in her mouth. Please, advise.

## 2011-03-23 ENCOUNTER — Telehealth: Payer: Self-pay | Admitting: *Deleted

## 2011-03-23 NOTE — Telephone Encounter (Signed)
Patient describes the "thrush"as blisters that are hurt. Spoke with Mike Gip, PA and she suggests stopping Bentyl. Would use Magic mouthwash but patient has an allergy to one of the ingredients. Called and left patient a message to call me.

## 2011-03-23 NOTE — Telephone Encounter (Signed)
error 

## 2011-03-23 NOTE — Telephone Encounter (Signed)
Spoke with patient and she has stopped the Bentyl and is doing warm salt water gargles which has helped her mouth.

## 2011-03-23 NOTE — Telephone Encounter (Signed)
Ok, good!

## 2011-03-28 ENCOUNTER — Ambulatory Visit (INDEPENDENT_AMBULATORY_CARE_PROVIDER_SITE_OTHER): Payer: Medicare Other | Admitting: Psychiatry

## 2011-03-28 DIAGNOSIS — F0630 Mood disorder due to known physiological condition, unspecified: Secondary | ICD-10-CM

## 2011-03-28 DIAGNOSIS — F410 Panic disorder [episodic paroxysmal anxiety] without agoraphobia: Secondary | ICD-10-CM

## 2011-04-05 ENCOUNTER — Ambulatory Visit: Payer: Medicare Other | Admitting: Internal Medicine

## 2011-04-07 ENCOUNTER — Encounter: Payer: Self-pay | Admitting: Internal Medicine

## 2011-04-07 ENCOUNTER — Ambulatory Visit (INDEPENDENT_AMBULATORY_CARE_PROVIDER_SITE_OTHER): Payer: Medicare Other | Admitting: Internal Medicine

## 2011-04-07 ENCOUNTER — Ambulatory Visit (INDEPENDENT_AMBULATORY_CARE_PROVIDER_SITE_OTHER): Payer: Medicare Other | Admitting: Psychiatry

## 2011-04-07 DIAGNOSIS — R059 Cough, unspecified: Secondary | ICD-10-CM

## 2011-04-07 DIAGNOSIS — F410 Panic disorder [episodic paroxysmal anxiety] without agoraphobia: Secondary | ICD-10-CM

## 2011-04-07 DIAGNOSIS — F0630 Mood disorder due to known physiological condition, unspecified: Secondary | ICD-10-CM

## 2011-04-07 DIAGNOSIS — R05 Cough: Secondary | ICD-10-CM

## 2011-04-07 DIAGNOSIS — J45909 Unspecified asthma, uncomplicated: Secondary | ICD-10-CM

## 2011-04-07 MED ORDER — TRAMADOL HCL 50 MG PO TABS: ORAL_TABLET | ORAL | Status: AC

## 2011-04-07 MED ORDER — PREDNISONE (PAK) 10 MG PO TABS: ORAL_TABLET | ORAL | Status: AC

## 2011-04-07 NOTE — Patient Instructions (Signed)
Take delsym two tsp every 12 hours and supplement if needed with  tramadol 50 mg up to 2 every 4 hours to suppress the urge to cough. Swallowing water or using ice chips/non mint and menthol containing candies (such as lifesavers or sugarless jolly ranchers) are also effective.  You should rest your voice and avoid activities that you know make you cough.  Once you have eliminated the cough for 3 straight days try reducing the tramadol first,  then the delsym as tolerated.   Prednisone 10 mg take  4 each am x 2 days,   2 each am x 2 days,  1 each am x2days and stop  Only use symbicort when you can't get your breath after you've effectively suppressed the cough as above  See Tammy NP w/in 2 weeks with all your medications, even over the counter meds, separated in two separate bags, the ones you take no matter what vs the ones you stop once you feel better and take only as needed when you feel you need them.   Tammy  will generate for you a new user friendly medication calendar that will put [place] all on the same page re: your medication use.     Without this process, it simply isn't possible to assure that we are providing  your outpatient care  with  the attention to detail we feel you deserve.   If we cannot assure that you're getting that kind of care,  then we cannot manage your problem effectively from this clinic.  Once you have seen Tammy and we are sure that we're all on the same page with your medication use she will arrange follow up with me.

## 2011-04-07 NOTE — Assessment & Plan Note (Signed)
Symptoms are markedly disproportionate to objective findings and not clear this is a lung problem but pt does appear to have difficult airway management issues.  DDX of  difficult airways managment all start with A and  include Adherence, Ace Inhibitors, Acid Reflux, Active Sinus Disease, Alpha 1 Antitripsin deficiency, Anxiety masquerading as Airways dz,  ABPA,  allergy(esp in young), Aspiration (esp in elderly), Adverse effects of DPI,  Active smokers, plus two Bs  = Bronchiectasis and Beta blocker use..and one C= CHF  ? Acid reflux > rx maximally  ? Anxiety > dx of exclusion  Reviewed optimal rx for all forms of inhalers which actually may worsen her upper airway complaints.

## 2011-04-07 NOTE — Assessment & Plan Note (Addendum)
Classic Upper airway cough syndrome, so named because it's frequently impossible to sort out how much is  CR/sinusitis with freq throat clearing (which can be related to primary GERD)   vs  causing  secondary (" extra esophageal")  GERD from wide swings in gastric pressure that occur with throat clearing, often  promoting self use of mint and menthol lozenges that reduce the lower esophageal sphincter tone and exacerbate the problem further in a cyclical fashion.   These are the same pts who not infrequently have failed to tolerate ace inhibitors,  dry powder inhalers or biphosphonates or report having reflux symptoms that don't respond to standard doses of PPI , and are easily confused as having aecopd or asthma flares, which was the case here  I strongly doubt this is asthma. See instructions for specific recommendations which were reviewed directly with the patient who was given a copy with highlighter outlining the key components.

## 2011-04-07 NOTE — Progress Notes (Signed)
Subjective:    Patient ID: [name], female    DOB: [birth year], 60 y.o.   MRN: [id number]  HPI 59 yowm quit smoking around 1985 with no respirartory problems at all.   July 16, 2010 1st pulmonary eval in EMR cc sob and refractory cough and wheeze since 05/2010, never previously had any fall allergies. See inpt eval with imp pseudoasthma, rec d/c ACE and and Inderal   August 18, 2010 ov cough much better off ace, no need for saba at all now, still not satisfied throat irritation better. rec Work on inhaler technique Flonase has no immediate benefit in terms of improving symptoms.  Prilosec 20 mg Take one 30-60 min before first meal of the day and pecid 20 mg one at bedtime  Please schedule a follow-up appointment in 6 weeks, sooner if needed  Think of your medications in 3 separate categories and keep them separate AND BRING THEM WITH YOU TO [facility] OFFICE VISIT  a) The ones you take no matter what daily on a scheduled basis these belong in a pillbox but also include flonase  b) The ones you only take if needed for specific problems  c) The ones you take for a short course and stop, like antibiotics and prednisone.  GERD (REFLUX) diet   October 01, 2010 ov cc cough resolved but still feels she needs the saba up to 3 times esp with activities like going to the dumpster and back - not clear whether sitting and resting works just as well to relieve sob. did not bring meds in separate bags as requested. count on dulera is way off > could not tolerate taking it regularly due to nerves. rec no change in rx  November 19, 2010 ov cc Cough- returned approx 4 days ago using ventolin after lie down helps x hours but using it up to 4 x daily also. no excess sputum. sob worse with ex but resolves s needing saba. rec 1) Work on inhaler technique: relax and blow all the way out then take a nice smooth deep breath back in, triggering the inhaler at same time you start breathing in  2) Symbicort 160 1 puffs  first thing in am and 1 puffs again in pm about 12 hours later if needed  3) Protonix 40 mg Take one 30-60 min before first meal of the day and Pepcid 20 mg one at bedtime  4) Stop dulera  5) GERD (REFLUX) diet  12/31/2010 ov /[name]/ final summary:  All smiles, no cough, sob,  rarely needs symbicort more than once or twice a week and no saba at all.    rec Only use  symbicort 160 if needed up to 1-2 puffs every 12 hours and if you use the max dose and are not happy call immediately for evaluation.  Stop pepcid first and if no increase in cough then stop protonix 2 weeks later and if resp symptoms flare you need to restart them  GERD (REFLUX) diet reviewed    01/24/11  Major reconstructive surgery under general anesthesia Dr Shon Hough then w/in 2 weeks started needing rescue inhaler   03/03/11 Acute OV  Pt presents for an acute office visit. Pt complains of asthma flare x1week with prod cough with milky-colored mucus, wheezing, increased SOB, tightness in chest.  Pt recently had breast reconstruction surgery 01/24/11 from previous breast cancer in past. Has been very sore from this. Is still on light duty. Last visit she was doing very well with rare  cough, She taperd off off PPI, Symbicort and Pepcid as recommended last ov. She has restarted Symbicort once daily .   03/10/2011  Ov/ [name] cc sob and coughing x months  confused with med names/ instructions, did not follow any of the contingency instructions from prev ov and started using rescue rx without symbicort.  GERD (REFLUX)  Every visit you need to bring every medication  Resume symbicort 160 Take 2 puffs first thing in am and then another 2 puffs about 12 hours   04/07/2011 ov/[name] cc  Cough with abd pain while coughing worse since July 22nd.    Allergies   1) ! Penicillin V Potassium (Penicillin V Potassium)  2) ! Covera-Hs (Verapamil Hcl)  3) ! Zoloft (Sertraline Hcl)  4) ! Librax (Clidinium-Chlordiazepoxide)  5) ! Paxil (Paroxetine  Hcl)  6) ! Sulfadiazine (Sulfadiazine)  7) ! Cephalexin (Cephalexin)  8) ! Amoxicillin (Amoxicillin)  9) ! Bromfenex  10) ! * Tequin  11) ! Latex  12) ! Darvocet  13) ! 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 To have port-a-cath placed for chemo. S/p chemo /radiation s/p lumpectomy -right  S/p reconstruction surgery 01/24/11  DEPRESSION (ICD-311)bipolar (?)  VIRAL INFECTION (ICD-079.99)  ADENOCARCINOMA, RIGHT BREAST (ICD-174.9)  DIVERTICULOSIS, COLON (ICD-562.10)  CONSTIPATION (ICD-564.00)  GASTROESOPHAGEAL REFLUX DISEASE (ICD-530.81)  BIPOLAR NOS (ICD-296.80)  LOW BACK PAIN (ICD-724.2)  ANXIETY (ICD-300.00)               Objective:   Physical Exam GEN: amb wf unusual affect who   failed to answer a single question asked in a straightforward manner, tending to go off on tangents or answer questions with ambiguous medical terms or diagnoses and seemed aggravated  when asked the same question more than once for clarification.   Wt 164 04/07/2011  HEENT:  [place]/AT,  EACs-clear, TMs-wnl, NOSE-clear, THROAT-clear, no lesions, no postnasal drip or exudate noted.   NECK:  Supple w/ fair ROM; no JVD; normal carotid impulses w/o bruits; no thyromegaly or nodules palpated; no lymphadenopathy.  RESP  Clear  P & A; w/o, wheezes/ rales/ or rhonchi.no accessory muscle use, no dullness to percussion  CARD:  RRR, no m/r/g  , no peripheral edema, pulses intact, no cyanosis or clubbing.  GI:   Soft & nt; nml bowel sounds; no  organomegaly or masses detected.  Musco: Warm bil, no deformities or joint swelling noted.             Assessment & Plan:

## 2011-04-18 ENCOUNTER — Ambulatory Visit: Payer: Medicare Other | Admitting: Psychiatry

## 2011-04-21 ENCOUNTER — Ambulatory Visit (INDEPENDENT_AMBULATORY_CARE_PROVIDER_SITE_OTHER): Payer: Medicare Other | Admitting: Psychiatry

## 2011-04-21 DIAGNOSIS — F0630 Mood disorder due to known physiological condition, unspecified: Secondary | ICD-10-CM

## 2011-04-21 DIAGNOSIS — F410 Panic disorder [episodic paroxysmal anxiety] without agoraphobia: Secondary | ICD-10-CM

## 2011-04-22 ENCOUNTER — Encounter: Payer: Self-pay | Admitting: Adult Health

## 2011-04-22 ENCOUNTER — Ambulatory Visit (INDEPENDENT_AMBULATORY_CARE_PROVIDER_SITE_OTHER): Payer: Medicare Other | Admitting: Adult Health

## 2011-04-22 DIAGNOSIS — R05 Cough: Secondary | ICD-10-CM

## 2011-04-22 DIAGNOSIS — R059 Cough, unspecified: Secondary | ICD-10-CM

## 2011-04-22 NOTE — Patient Instructions (Signed)
Follow med calendar closely and bring to each visit.  Follow up Dr. Wert  In 6 weeks   

## 2011-04-22 NOTE — Progress Notes (Signed)
Subjective:    Patient ID: [name], female    DOB: [birth year], 60 y.o.   MRN: [id number]  HPI 9 yowm quit smoking around 1985 with no respirartory problems at all.   July 16, 2010 1st pulmonary eval in EMR cc sob and refractory cough and wheeze since 05/2010, never previously had any fall allergies. See inpt eval with imp pseudoasthma, rec d/c ACE and and Inderal   August 18, 2010 ov cough much better off ace, no need for saba at all now, still not satisfied throat irritation better. rec Work on inhaler technique Flonase has no immediate benefit in terms of improving symptoms.  Prilosec 20 mg Take one 30-60 min before first meal of the day and pecid 20 mg one at bedtime  Please schedule a follow-up appointment in 6 weeks, sooner if needed  Think of your medications in 3 separate categories and keep them separate AND BRING THEM WITH YOU TO [facility] OFFICE VISIT  a) The ones you take no matter what daily on a scheduled basis these belong in a pillbox but also include flonase  b) The ones you only take if needed for specific problems  c) The ones you take for a short course and stop, like antibiotics and prednisone.  GERD (REFLUX) diet   October 01, 2010 ov cc cough resolved but still feels she needs the saba up to 3 times esp with activities like going to the dumpster and back - not clear whether sitting and resting works just as well to relieve sob. did not bring meds in separate bags as requested. count on dulera is way off > could not tolerate taking it regularly due to nerves. rec no change in rx  November 19, 2010 ov cc Cough- returned approx 4 days ago using ventolin after lie down helps x hours but using it up to 4 x daily also. no excess sputum. sob worse with ex but resolves s needing saba. rec 1) Work on inhaler technique: relax and blow all the way out then take a nice smooth deep breath back in, triggering the inhaler at same time you start breathing in  2) Symbicort 160 1 puffs  first thing in am and 1 puffs again in pm about 12 hours later if needed  3) Protonix 40 mg Take one 30-60 min before first meal of the day and Pepcid 20 mg one at bedtime  4) Stop dulera  5) GERD (REFLUX) diet  12/31/2010 ov /Wert/ final summary:  All smiles, no cough, sob,  rarely needs symbicort more than once or twice a week and no saba at all.    rec Only use  symbicort 160 if needed up to 1-2 puffs every 12 hours and if you use the max dose and are not happy call immediately for evaluation.  Stop pepcid first and if no increase in cough then stop protonix 2 weeks later and if resp symptoms flare you need to restart them  GERD (REFLUX) diet reviewed    01/24/11  Major reconstructive surgery under general anesthesia Dr Shon Hough then w/in 2 weeks started needing rescue inhaler   03/03/11 Acute OV  Pt presents for an acute office visit. Pt complains of asthma flare x1week with prod cough with milky-colored mucus, wheezing, increased SOB, tightness in chest.  Pt recently had breast reconstruction surgery 01/24/11 from previous breast cancer in past. Has been very sore from this. Is still on light duty. Last visit she was doing very well with rare  cough, She taperd off off PPI, Symbicort and Pepcid as recommended last ov. She has restarted Symbicort once daily .   03/10/2011  Ov/ Wert cc sob and coughing x months  confused with med names/ instructions, did not follow any of the contingency instructions from prev ov and started using rescue rx without symbicort.  GERD (REFLUX)  Every visit you need to bring every medication Resume symbicort 160 Take 2 puffs first thing in am and then another 2 puffs about 12 hours   04/07/2011 ov/Wert cc  Cough with abd pain while coughing worse since July 22nd.>>steroid taper   04/22/2011 follow up and med review  Pt returns for follow up and med review. We organized them into a med calendar with pt education .   She is feeling better with decreased cough.  Cough is almost gone. Last ov with slow to resolve cough -tx with steroid taper. She finished this with noticeable improvement. No congestion or fever.     Allergies   1) ! Penicillin V Potassium (Penicillin V Potassium)  2) ! Covera-Hs (Verapamil Hcl)  3) ! Zoloft (Sertraline Hcl)  4) ! Librax (Clidinium-Chlordiazepoxide)  5) ! Paxil (Paroxetine Hcl)  6) ! Sulfadiazine (Sulfadiazine)  7) ! Cephalexin (Cephalexin)  8) ! Amoxicillin (Amoxicillin)  9) ! Bromfenex  10) ! * Tequin  11) ! Latex  12) ! Darvocet  13) ! 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 To have port-a-cath placed for chemo. S/p chemo /radiation s/p lumpectomy -right  S/p reconstruction surgery 01/24/11  DEPRESSION (ICD-311)bipolar (?)  VIRAL INFECTION (ICD-079.99)  ADENOCARCINOMA, RIGHT BREAST (ICD-174.9)  DIVERTICULOSIS, COLON (ICD-562.10)  CONSTIPATION (ICD-564.00)  GASTROESOPHAGEAL REFLUX DISEASE (ICD-530.81)  BIPOLAR NOS (ICD-296.80)  LOW BACK PAIN (ICD-724.2)  ANXIETY (ICD-300.00)               Objective:   Physical Exam GEN: NAD  Wt 164 04/07/2011 >>166 04/22/2011  HEENT:  [place]/AT,  EACs-clear, TMs-wnl, NOSE-clear, THROAT-clear, no lesions, no postnasal drip or exudate noted.   NECK:  Supple w/ fair ROM; no JVD; normal carotid impulses w/o bruits; no thyromegaly or nodules palpated; no lymphadenopathy.  RESP  Clear  P & A; w/o, wheezes/ rales/ or rhonchi.no accessory muscle use, no dullness to percussion  CARD:  RRR, no m/r/g  , no  peripheral edema, pulses intact, no cyanosis or clubbing.  GI:   Soft & nt; nml bowel sounds; no organomegaly or masses detected.  Musco: Warm bil, no deformities or joint swelling noted.             Assessment & Plan:

## 2011-04-22 NOTE — Assessment & Plan Note (Signed)
Improved with tx aimed at GERD and cough prevention regimen.  Patient's medications were reviewed today and patient education was given. Computerized medication calendar was adjusted/completed

## 2011-04-27 ENCOUNTER — Encounter: Payer: Self-pay | Admitting: Cardiology

## 2011-04-27 ENCOUNTER — Ambulatory Visit (INDEPENDENT_AMBULATORY_CARE_PROVIDER_SITE_OTHER): Payer: Medicare Other | Admitting: Cardiology

## 2011-04-27 DIAGNOSIS — I252 Old myocardial infarction: Secondary | ICD-10-CM

## 2011-04-27 DIAGNOSIS — I10 Essential (primary) hypertension: Secondary | ICD-10-CM

## 2011-04-27 DIAGNOSIS — I2510 Atherosclerotic heart disease of native coronary artery without angina pectoris: Secondary | ICD-10-CM

## 2011-04-27 NOTE — Assessment & Plan Note (Signed)
Stable.  No change in therapy. 

## 2011-04-27 NOTE — Progress Notes (Signed)
Addended by: Reynaldo Minium C on: 04/27/2011 12:56 PM   Modules accepted: Orders

## 2011-04-27 NOTE — Patient Instructions (Signed)
Your physician recommends that you schedule a follow-up appointment in: 1 year with Dr. Wall  

## 2011-04-27 NOTE — Progress Notes (Signed)
HPI [name] returns today for evaluation and management of her history of coronary artery disease. She is having no angina. She has had some breathing or regularity at night. [name] has noticed at times that she snores a lot, it is not clear if she quits breathing.  She denies any angina, orthopnea, PND, edema, palpitations. She is compliant with her medications.  She's been a lot of stress lately with her mother being ill and out of nursing homes. She's also had to go back to school to get her certification period  EKG from March 05, 2011 is normal except for poor R-wave progression in the anterior precordium. Past Medical History  Diagnosis Date  . Myocardial infarct 2005  . CAD (coronary artery disease)   . Hypertension   . Encounter for long-term (current) use of anticoagulants   . Edema   . Hematoma   . Sinusitis acute   . Adenocarcinoma of breast     right  . Acute upper respiratory infections of unspecified site   . Depression   . Anal fissure   . GERD (gastroesophageal reflux disease)   . Dog bite   . Diverticulosis of colon (without mention of hemorrhage)   . Spinal stenosis, lumbar region, without neurogenic claudication   . Anxiety   . Panic attacks   . Asthma   . Irritable bowel syndrome     Past Surgical History  Procedure Date  . Vesicovaginal fistula closure w/ tah 1987  . Cardiac catheterization 2001  . Coronary angioplasty with stent placement 2001  . Cholecystectomy   . Bladder repair     tact  . Partial hymenectomy     Family History  Problem Relation Age of Onset  . Hypertension Mother     father, sister brother  . Pancreatic cancer Father   . Heart disease Mother     sister  . Diabetes Father     mother  . Dementia Mother   . Arthritis Mother     History   Social History  . Marital Status: Divorced    Spouse Name: N/A    Number of Children: 4  . Years of Education: N/A   Occupational History  . Teacher    Social History Main  Topics  . Smoking status: Former Smoker -- 0.3 packs/day for 8 years    Types: Cigarettes    Quit date: 09/13/1983  . Smokeless tobacco: Never Used  . Alcohol Use: No  . Drug Use: No  . Sexually Active: Not on file   Other Topics Concern  . Not on file   Social History Narrative  . No narrative on file    Allergies  Allergen Reactions  . Amoxicillin   . Azithromycin     REACTION: bad reaction  . Bentyl Hives  . Bromfed   . Cephalexin   . Iohexol      Code: HIVES, Desc: throat swelling no hives 20 yrs ago;needs pre-medication  09/19/07 sg, Onset Date: [id number]   . Latex   . Librax   . Paroxetine   . Penicillins   . Propoxyphene N-Acetaminophen     REACTION: swelling in the throat  . Sertraline Hcl   . Sulfadiazine   . Verapamil     Current Outpatient Prescriptions  Medication Sig Dispense Refill  . acetaminophen (TYLENOL) 500 MG tablet Take 500 mg by mouth every 6 (six) hours as needed.        . ALPRAZolam (XANAX) 1 MG tablet 1/2 to  1 tablet 3 times daily as needed      . Ascorbic Acid (VITAMIN C) 500 MG tablet Take 500 mg by mouth daily.        Marland Kitchen aspirin 81 MG tablet Take 81 mg by mouth daily.        . bisoprolol (ZEBETA) 5 MG tablet Take 1 tablet (5 mg total) by mouth 2 (two) times daily.  60 tablet  3  . budesonide-formoterol (SYMBICORT) 160-4.5 MCG/ACT inhaler Inhale 1 puff into the lungs at bedtime.       . Calcium Carbonate-Vitamin D (CALCIUM-CARB 600 + D) 600-125 MG-UNIT TABS Take 1 capsule by mouth daily.        . cetirizine (ZYRTEC) 10 MG tablet Take 10 mg by mouth at bedtime as needed.        . Cholecalciferol (VITAMIN D3) 400 UNITS CAPS Take 1 capsule by mouth daily.        . cycloSPORINE (RESTASIS) 0.05 % ophthalmic emulsion Place 1 drop into both eyes 2 (two) times daily.        Marland Kitchen docusate sodium (COLACE) 50 MG capsule Take by mouth 2 (two) times daily.        . famotidine (PEPCID) 20 MG tablet Take 20 mg by mouth at bedtime.       . fluticasone (FLONASE)  50 MCG/ACT nasal spray 2 sprays by Nasal route at bedtime as needed.        . furosemide (LASIX) 20 MG tablet Take 20 mg by mouth daily.        Marland Kitchen levalbuterol (XOPENEX HFA) 45 MCG/ACT inhaler Inhale 2 puffs into the lungs as needed.        . nitroGLYCERIN (NITROSTAT) 0.4 MG SL tablet Place 0.4 mg under the tongue every 5 (five) minutes as needed.        . pantoprazole (PROTONIX) 40 MG tablet Take 1 tablet (40 mg total) by mouth daily before breakfast.  30 tablet  5  . pravastatin (PRAVACHOL) 40 MG tablet Take 40 mg by mouth daily.        . Probiotic Product (ALIGN) 4 MG CAPS Take 1 capsule by mouth daily.  21 capsule  0  . traMADol (ULTRAM) 50 MG tablet Take 1-2 every 4 hours as needed       . valsartan (DIOVAN) 160 MG tablet Take 1 tablet (160 mg total) by mouth daily.  30 tablet  3    ROS Negative other than HPI.   PE General Appearance: well developed, well nourished in no acute distress HEENT: symmetrical face, PERRLA, good dentition  Neck: no JVD, thyromegaly, or adenopathy, trachea midline Chest: symmetric without deformity Cardiac: PMI non-displaced, RRR, normal S1, S2, no gallop or murmur Lung: clear to ausculation and percussion Vascular: all pulses full without bruits  Abdominal: nondistended, nontender, good bowel sounds, no HSM, no bruits Extremities: no cyanosis, clubbing or edema, no sign of DVT, no varicosities  Skin: normal color, no rashes Neuro: alert and oriented x 3, non-focal Pysch: normal affect Filed Vitals:   04/27/11 1618  BP: 138/80  Pulse: 67  Resp: 12  Height: 5\' 4"  (1.626 m)  Weight: 164 lb (74.39 kg)    EKG  Labs and Studies Reviewed.   Lab Results  Component Value Date   WBC 5.4 03/17/2011   HGB 12.9 03/17/2011   HCT 37.7 03/17/2011   MCV 91.0 03/17/2011   PLT 207.0 03/17/2011      Chemistry      Component Value  Date/Time   NA 140 03/05/2011 0208   K 3.1* 03/05/2011 0208   CL 103 03/05/2011 0208   CO2 28 03/05/2011 0208   BUN 15 03/05/2011 0208     CREATININE 0.75 03/05/2011 0208      Component Value Date/Time   CALCIUM 10.4 03/05/2011 0208   ALKPHOS 94 03/05/2011 0208   AST 17 03/05/2011 0208   ALT 11 03/05/2011 0208   BILITOT 0.4 03/05/2011 0208       Lab Results  Component Value Date   CHOL  Value: 137        ATP III CLASSIFICATION:  <200     mg/dL   Desirable  784-696  mg/dL   Borderline High  >=295    mg/dL   High        28/12/1322   CHOL 155 04/05/2010   Lab Results  Component Value Date   HDL 51 07/17/2010   HDL 47.50 04/05/2010   Lab Results  Component Value Date   LDLCALC  Value: 75        Total Cholesterol/HDL:CHD Risk Coronary Heart Disease Risk Table                     Men   Women  1/2 Average Risk   3.4   3.3  Average Risk       5.0   4.4  2 X Average Risk   9.6   7.1  3 X Average Risk  23.4   11.0        Use the calculated Patient Ratio above and the CHD Risk Table to determine the patient's CHD Risk.        ATP III CLASSIFICATION (LDL):  <100     mg/dL   Optimal  401-027  mg/dL   Near or Above                    Optimal  130-159  mg/dL   Borderline  253-664  mg/dL   High  >403     mg/dL   Very High 47/12/2593   LDLCALC 89 04/05/2010   Lab Results  Component Value Date   TRIG 55 07/17/2010   TRIG 95.0 04/05/2010   Lab Results  Component Value Date   CHOLHDL 2.7 07/17/2010   CHOLHDL 3 04/05/2010   Lab Results  Component Value Date   HGBA1C  Value: 5.9 (NOTE)                                                                       According to the ADA Clinical Practice Recommendations for 2011, when HbA1c is used as a screening test:   >=6.5%   Diagnostic of Diabetes Mellitus           (if abnormal result  is confirmed)  5.7-6.4%   Increased risk of developing Diabetes Mellitus  References:Diagnosis and Classification of Diabetes Mellitus,Diabetes Care,2011,34(Suppl 1):S62-S69 and Standards of Medical Care in         Diabetes - 2011,Diabetes Care,2011,34  (Suppl 1):S11-S61.* 07/16/2010   Lab Results  Component Value Date    ALT 11 03/05/2011   AST 17 03/05/2011   ALKPHOS 94 03/05/2011   BILITOT 0.4 03/05/2011  Lab Results  Component Value Date   TSH 0.831 07/16/2010

## 2011-05-04 ENCOUNTER — Telehealth: Payer: Self-pay | Admitting: Physician Assistant

## 2011-05-04 NOTE — Telephone Encounter (Signed)
Patient had many questions about her medications since he visit on 03/17/11 with Mike Gip, PA. Discussed with patient the medications from that visit. She states her stomach is "swelling after eating." Patient wants to see Dr. Jarold Motto. Scheduled patient on 04/3011 at 11:30 AM.

## 2011-05-05 ENCOUNTER — Ambulatory Visit (INDEPENDENT_AMBULATORY_CARE_PROVIDER_SITE_OTHER): Payer: Medicare Other | Admitting: Psychiatry

## 2011-05-05 DIAGNOSIS — F0630 Mood disorder due to known physiological condition, unspecified: Secondary | ICD-10-CM

## 2011-05-05 DIAGNOSIS — F410 Panic disorder [episodic paroxysmal anxiety] without agoraphobia: Secondary | ICD-10-CM

## 2011-05-12 ENCOUNTER — Telehealth: Payer: Self-pay | Admitting: Internal Medicine

## 2011-05-12 ENCOUNTER — Encounter: Payer: Self-pay | Admitting: *Deleted

## 2011-05-12 NOTE — Telephone Encounter (Signed)
Spoke with pt and she states she needed a sample of symbicort.  I advised pt would leave a sample upfront. Nothing further was needed

## 2011-05-13 ENCOUNTER — Ambulatory Visit (INDEPENDENT_AMBULATORY_CARE_PROVIDER_SITE_OTHER): Payer: Medicare Other | Admitting: Gastroenterology

## 2011-05-13 ENCOUNTER — Other Ambulatory Visit (INDEPENDENT_AMBULATORY_CARE_PROVIDER_SITE_OTHER): Payer: Medicare Other

## 2011-05-13 ENCOUNTER — Encounter: Payer: Self-pay | Admitting: Gastroenterology

## 2011-05-13 VITALS — BP 152/88 | HR 64 | Ht 64.0 in | Wt 164.0 lb

## 2011-05-13 DIAGNOSIS — K589 Irritable bowel syndrome without diarrhea: Secondary | ICD-10-CM

## 2011-05-13 DIAGNOSIS — R141 Gas pain: Secondary | ICD-10-CM

## 2011-05-13 DIAGNOSIS — R140 Abdominal distension (gaseous): Secondary | ICD-10-CM

## 2011-05-13 DIAGNOSIS — R1013 Epigastric pain: Secondary | ICD-10-CM

## 2011-05-13 DIAGNOSIS — K219 Gastro-esophageal reflux disease without esophagitis: Secondary | ICD-10-CM

## 2011-05-13 DIAGNOSIS — R143 Flatulence: Secondary | ICD-10-CM

## 2011-05-13 DIAGNOSIS — Z86010 Personal history of colonic polyps: Secondary | ICD-10-CM

## 2011-05-13 DIAGNOSIS — G8929 Other chronic pain: Secondary | ICD-10-CM

## 2011-05-13 LAB — HEPATIC FUNCTION PANEL
AST: 20 U/L (ref 0–37)
Alkaline Phosphatase: 74 U/L (ref 39–117)
Total Bilirubin: 1 mg/dL (ref 0.3–1.2)

## 2011-05-13 LAB — AMYLASE: Amylase: 58 U/L (ref 27–131)

## 2011-05-13 NOTE — Patient Instructions (Signed)
Your procedure has been scheduled for 05/23/2011, please follow the seperate instructions.  Please go to the basement today for your labs.

## 2011-05-13 NOTE — Progress Notes (Signed)
This is a very complicated 60 year old Caucasian female with chronic IBS apparently had colonoscopy at [facility] 2-3 years ago with removal of colon polyps. I've seen her for many years because of IBS and acid reflux problems area she recent saw our physician's assistant in June with a very detailed note reviewed at this time. It has had multiple negative radiographs. She has not had endoscopy many years. Her main complaint today is epigastric discomfort exacerbated by 18 with early satiety, nausea, and constipation. She does have lactose intolerance. She is status post cholecystectomy, and denies any specific hepatobiliary or systemic complaints. She has a history of at least 15 drug allergies she recently has had reconstructive breast surgery after mastectomy and chemotherapy for breast carcinoma. She is on approximately 20 medications listed and reviewed her chart which include Protonix 40 mg a day, probiotics, when necessary Ultram and also large doses of acetaminophen, Xanax 3 times a day, and various inhalers for her COPD. She denies current alcohol, cigarette, or NSAID use. There are no specific hepatobiliary complaints or systemic complaints such as fever or chills. Also denies any previous history of hepatitis or pancreatitis.  Current Medications, Allergies, Past Medical History, Past Surgical History, Family History and Social History were reviewed in [employer] record.  Pertinent Review of Systems Negative... running fatigue with chronic anxiety syndrome. She denies current cardiovascular or pulmonary complaints.   Physical Exam: Awake and alert no acute distress appears stated age. I cannot appreciate stigmata of chronic liver disease. Chest is clear cardiac exam is unremarkable. There is no hepatosplenomegaly, abdominal masses or significant tenderness. Bowel sounds are normal. Peripheral extremities are unremarkable, and mental status is normal. There are no gross  focal neurological deficits.    Assessment and Plan: Chronic abdominal pain with no endoscopic exam in greater than 10 years. She is status post cholecystectomy. I have increased her Protonix to a twice a day dosage with when necessary Carafate suspension. I suspect this patient has a hiatal hernia and worsening GERD. I have ordered serum amylase, lipase, and celiac profile. Try to obtain her records from [facility] concern her colonoscopy. I have urged her to avoid large doses of acetaminophen and to use when necessary tramadol.   Please copy her primary care physician, referring physician, and pertinent subspecialists. Encounter Diagnoses  Name Primary?  . Bloating Yes  . Chronic epigastric pain   . IBS (irritable bowel syndrome)   . Personal history of colonic polyps   . GERD (gastroesophageal reflux disease)

## 2011-05-17 ENCOUNTER — Encounter: Payer: Self-pay | Admitting: Gastroenterology

## 2011-05-17 LAB — CELIAC PANEL 10: Tissue Transglut Ab: 3 U/mL (ref <20)

## 2011-05-23 ENCOUNTER — Encounter: Payer: Self-pay | Admitting: Gastroenterology

## 2011-05-23 ENCOUNTER — Ambulatory Visit (AMBULATORY_SURGERY_CENTER): Payer: Medicare Other | Admitting: Gastroenterology

## 2011-05-23 DIAGNOSIS — R142 Eructation: Secondary | ICD-10-CM

## 2011-05-23 DIAGNOSIS — R1013 Epigastric pain: Secondary | ICD-10-CM

## 2011-05-23 DIAGNOSIS — K219 Gastro-esophageal reflux disease without esophagitis: Secondary | ICD-10-CM

## 2011-05-23 DIAGNOSIS — K449 Diaphragmatic hernia without obstruction or gangrene: Secondary | ICD-10-CM

## 2011-05-23 DIAGNOSIS — R141 Gas pain: Secondary | ICD-10-CM

## 2011-05-23 MED ORDER — SODIUM CHLORIDE 0.9 % IV SOLN: 500.0000 mL | INTRAVENOUS | Status: DC

## 2011-05-23 NOTE — Patient Instructions (Signed)
Discharge instructions given with verbal understanding. Handouts on a hiatal hernia and a soft diet given. Resume previous medications. 

## 2011-05-24 ENCOUNTER — Telehealth: Payer: Self-pay | Admitting: *Deleted

## 2011-05-24 NOTE — Telephone Encounter (Signed)

## 2011-05-25 ENCOUNTER — Ambulatory Visit (INDEPENDENT_AMBULATORY_CARE_PROVIDER_SITE_OTHER): Payer: Medicare Other | Admitting: Psychiatry

## 2011-05-25 DIAGNOSIS — K219 Gastro-esophageal reflux disease without esophagitis: Secondary | ICD-10-CM

## 2011-05-25 DIAGNOSIS — F0630 Mood disorder due to known physiological condition, unspecified: Secondary | ICD-10-CM

## 2011-05-25 DIAGNOSIS — F410 Panic disorder [episodic paroxysmal anxiety] without agoraphobia: Secondary | ICD-10-CM

## 2011-05-25 DIAGNOSIS — K449 Diaphragmatic hernia without obstruction or gangrene: Secondary | ICD-10-CM

## 2011-05-25 LAB — HELICOBACTER PYLORI SCREEN-BIOPSY: UREASE: NEGATIVE

## 2011-05-26 ENCOUNTER — Encounter: Payer: Self-pay | Admitting: Gastroenterology

## 2011-05-27 ENCOUNTER — Other Ambulatory Visit: Payer: Self-pay | Admitting: Gastroenterology

## 2011-05-27 MED ORDER — PANTOPRAZOLE SODIUM 40 MG PO TBEC: 40.0000 mg | DELAYED_RELEASE_TABLET | Freq: Two times a day (BID) | ORAL | Status: DC

## 2011-05-27 NOTE — Telephone Encounter (Signed)
rx sent and pt aware h pylori neg.

## 2011-05-31 ENCOUNTER — Other Ambulatory Visit: Payer: Self-pay | Admitting: Oncology

## 2011-05-31 ENCOUNTER — Encounter (HOSPITAL_BASED_OUTPATIENT_CLINIC_OR_DEPARTMENT_OTHER): Payer: Medicare Other | Admitting: Oncology

## 2011-05-31 ENCOUNTER — Ambulatory Visit: Payer: Medicare Other | Admitting: Internal Medicine

## 2011-05-31 DIAGNOSIS — C50419 Malignant neoplasm of upper-outer quadrant of unspecified female breast: Secondary | ICD-10-CM

## 2011-05-31 DIAGNOSIS — Z170 Estrogen receptor positive status [ER+]: Secondary | ICD-10-CM

## 2011-05-31 LAB — CBC WITH DIFFERENTIAL/PLATELET
BASO%: 0.7 % (ref 0.0–2.0)
EOS%: 1.3 % (ref 0.0–7.0)
HCT: 38.4 % (ref 34.8–46.6)
LYMPH%: 34.1 % (ref 14.0–49.7)
MCH: 31 pg (ref 25.1–34.0)
MCHC: 34.4 g/dL (ref 31.5–36.0)
MONO#: 0.2 10*3/uL (ref 0.1–0.9)
NEUT%: 57.6 % (ref 38.4–76.8)
Platelets: 155 10*3/uL (ref 145–400)
RBC: 4.25 10*6/uL (ref 3.70–5.45)
WBC: 3.6 10*3/uL — ABNORMAL LOW (ref 3.9–10.3)

## 2011-05-31 LAB — COMPREHENSIVE METABOLIC PANEL
BUN: 15 mg/dL (ref 6–23)
CO2: 31 mEq/L (ref 19–32)
Creatinine, Ser: 0.82 mg/dL (ref 0.50–1.10)
Glucose, Bld: 132 mg/dL — ABNORMAL HIGH (ref 70–99)
Total Bilirubin: 0.6 mg/dL (ref 0.3–1.2)

## 2011-05-31 LAB — VITAMIN D 25 HYDROXY (VIT D DEFICIENCY, FRACTURES): Vit D, 25-Hydroxy: 34 ng/mL (ref 30–89)

## 2011-05-31 LAB — CANCER ANTIGEN 27.29: CA 27.29: 30 U/mL (ref 0–39)

## 2011-05-31 LAB — LACTATE DEHYDROGENASE: LDH: 166 U/L (ref 94–250)

## 2011-06-02 ENCOUNTER — Ambulatory Visit: Payer: Medicare Other | Admitting: Psychiatry

## 2011-06-03 LAB — BASIC METABOLIC PANEL
Calcium: 9.9
GFR calc Af Amer: >60
GFR calc non Af Amer: >60
Glucose, Bld: 116 — ABNORMAL HIGH
Sodium: 137

## 2011-06-03 LAB — POCT HEMOGLOBIN-HEMACUE: Hemoglobin: 13.7

## 2011-06-05 ENCOUNTER — Other Ambulatory Visit: Payer: Self-pay | Admitting: Cardiology

## 2011-06-06 ENCOUNTER — Encounter (HOSPITAL_BASED_OUTPATIENT_CLINIC_OR_DEPARTMENT_OTHER): Payer: Medicare Other | Admitting: Oncology

## 2011-06-06 ENCOUNTER — Other Ambulatory Visit: Payer: Self-pay | Admitting: Oncology

## 2011-06-06 ENCOUNTER — Other Ambulatory Visit: Payer: Self-pay | Admitting: Cardiology

## 2011-06-06 ENCOUNTER — Encounter: Payer: Self-pay | Admitting: *Deleted

## 2011-06-06 DIAGNOSIS — C50919 Malignant neoplasm of unspecified site of unspecified female breast: Secondary | ICD-10-CM

## 2011-06-06 DIAGNOSIS — Z170 Estrogen receptor positive status [ER+]: Secondary | ICD-10-CM

## 2011-06-06 DIAGNOSIS — Z9889 Other specified postprocedural states: Secondary | ICD-10-CM

## 2011-06-06 DIAGNOSIS — C50419 Malignant neoplasm of upper-outer quadrant of unspecified female breast: Secondary | ICD-10-CM

## 2011-06-06 NOTE — Telephone Encounter (Signed)
Pt calling with complaints of pt hair falling out. Pt thinks this could be a negative side effect of pt BP medicine, bisoprolol 5 mg 2x/day.   Pt stated it is not controlling pt BP as well. Today BP was: 1st-168/89 2nd-168/99  Please return pt call to advise/discuss the option of changing pt RX.

## 2011-06-06 NOTE — Telephone Encounter (Signed)
Pt calls with c/o blood pressure elevation.  At cancer Dr. Isidore Moos today blood pressure was 164/89-90.  She is taking all of her medications. She will keep a record of her blood pressure.  She will follow-up with Dr. Daleen Squibb on Thursday. Her hair has started thinning over the past month. Reassured pt.   Pt has also had some c/o sore throat and will be seeing Dr. Jarold Motto Friday re? Acid reflux.  Pt is on Pantoprazole  Mylo Red RN

## 2011-06-08 ENCOUNTER — Ambulatory Visit (INDEPENDENT_AMBULATORY_CARE_PROVIDER_SITE_OTHER): Payer: Medicare Other | Admitting: Psychiatry

## 2011-06-08 DIAGNOSIS — F0630 Mood disorder due to known physiological condition, unspecified: Secondary | ICD-10-CM

## 2011-06-08 DIAGNOSIS — F410 Panic disorder [episodic paroxysmal anxiety] without agoraphobia: Secondary | ICD-10-CM

## 2011-06-09 ENCOUNTER — Encounter: Payer: Self-pay | Admitting: Cardiology

## 2011-06-09 ENCOUNTER — Ambulatory Visit (INDEPENDENT_AMBULATORY_CARE_PROVIDER_SITE_OTHER): Payer: Medicare Other | Admitting: Cardiology

## 2011-06-09 DIAGNOSIS — I10 Essential (primary) hypertension: Secondary | ICD-10-CM

## 2011-06-09 DIAGNOSIS — I252 Old myocardial infarction: Secondary | ICD-10-CM

## 2011-06-09 DIAGNOSIS — R7309 Other abnormal glucose: Secondary | ICD-10-CM

## 2011-06-09 DIAGNOSIS — I2510 Atherosclerotic heart disease of native coronary artery without angina pectoris: Secondary | ICD-10-CM

## 2011-06-09 LAB — COMPREHENSIVE METABOLIC PANEL
ALT: 40 — ABNORMAL HIGH
Alkaline Phosphatase: 58
CO2: 29
Calcium: 9.6
Chloride: 100
GFR calc non Af Amer: >60
Glucose, Bld: 113 — ABNORMAL HIGH
Potassium: 3.3 — ABNORMAL LOW
Sodium: 137
Total Bilirubin: 0.8

## 2011-06-09 LAB — CLOSTRIDIUM DIFFICILE EIA
C difficile Toxins A+B, EIA: NEGATIVE
C difficile Toxins A+B, EIA: NEGATIVE

## 2011-06-09 LAB — DIFFERENTIAL
Band Neutrophils: 0
Band Neutrophils: 0
Basophils Absolute: 0
Basophils Relative: 0
Blasts: 0
Eosinophils Absolute: 0
Eosinophils Relative: 0
Lymphocytes Relative: 0 — ABNORMAL LOW
Metamyelocytes Relative: 0
Myelocytes: 0
Neutro Abs: 0.6 — ABNORMAL LOW
Neutrophils Relative %: 0 — ABNORMAL LOW
Neutrophils Relative %: 35 — ABNORMAL LOW
Promyelocytes Absolute: 0

## 2011-06-09 LAB — CBC
HCT: 26.3 — ABNORMAL LOW
Hemoglobin: 9.1 — ABNORMAL LOW
Hemoglobin: 9.2 — ABNORMAL LOW
MCHC: 34.3
MCV: 100.7 — ABNORMAL HIGH
Platelets: 100 — ABNORMAL LOW
Platelets: 129 — ABNORMAL LOW
RDW: 16.2 — ABNORMAL HIGH
WBC: 0.9 — CL
WBC: 1.1 — CL

## 2011-06-09 LAB — CULTURE, BLOOD (ROUTINE X 2): Culture: NO GROWTH

## 2011-06-09 LAB — BASIC METABOLIC PANEL
BUN: 4 — ABNORMAL LOW
Calcium: 8.9
GFR calc non Af Amer: >60
Potassium: 3.8
Sodium: 142

## 2011-06-09 LAB — URINALYSIS, ROUTINE W REFLEX MICROSCOPIC
Bilirubin Urine: NEGATIVE
Nitrite: NEGATIVE
Specific Gravity, Urine: 1.005
pH: 6.5

## 2011-06-09 LAB — URINE CULTURE: Special Requests: NEGATIVE

## 2011-06-09 LAB — PATHOLOGIST SMEAR REVIEW

## 2011-06-09 MED ORDER — BISOPROLOL FUMARATE 10 MG PO TABS: 10.0000 mg | ORAL_TABLET | Freq: Two times a day (BID) | ORAL | Status: DC

## 2011-06-09 NOTE — Patient Instructions (Addendum)
Your physician recommends that you return for lab work tomorrow at the [place] lab for a Hemoglobin A1C  Your physician recommends that you schedule a follow-up appointment in: 1 year with Dr. Daleen Squibb  Your physician has recommended you make the following change in your medication:  Increase Bisoprol

## 2011-06-09 NOTE — Assessment & Plan Note (Signed)
Stable. No change in treatment. 

## 2011-06-09 NOTE — Progress Notes (Signed)
HPI [name] is a times a day for evaluation and management of her and coronary artery disease. Her biggest complaint is being very thirsty and urinating all the time. She is on 70 pounds. Her last fasting blood sugar was mildly elevated at 109.   She denies any angina, chest pain, orthopnea, PND, and any significant edema. She does take Lasix daily.  EKG from June 23 shows poor  R-wave progression,  stable.  She denies any dysuria or urinary symptoms. Past Medical History  Diagnosis Date  . Myocardial infarct 2005  . CAD (coronary artery disease)   . Hypertension   . Encounter for long-term (current) use of anticoagulants   . Edema   . Hematoma   . Sinusitis acute   . Adenocarcinoma of breast     right  . Acute upper respiratory infections of unspecified site   . Depression   . Anal fissure   . GERD (gastroesophageal reflux disease)   . Dog bite   . Diverticulosis of colon (without mention of hemorrhage)   . Spinal stenosis, lumbar region, without neurogenic claudication   . Anxiety   . Panic attacks   . Asthma   . Irritable bowel syndrome   . Hiatal hernia     Past Surgical History  Procedure Date  . Vesicovaginal fistula closure w/ tah 1987  . Cardiac catheterization 2001  . Coronary angioplasty with stent placement 2001  . Cholecystectomy   . Bladder repair     tact  . Partial hymenectomy     Family History  Problem Relation Age of Onset  . Hypertension Mother   . Pancreatic cancer Father   . Heart disease Mother   . Diabetes Father   . Dementia Mother   . Arthritis Mother   . Hypertension Father   . Hypertension Sister   . Hypertension Brother   . Heart disease Sister   . Diabetes Mother     History   Social History  . Marital Status: Divorced    Spouse Name: N/A    Number of Children: 4  . Years of Education: N/A   Occupational History  . Teacher    Social History Main Topics  . Smoking status: Former Smoker -- 0.3 packs/day for 8 years      Types: Cigarettes    Quit date: 09/13/1983  . Smokeless tobacco: Never Used  . Alcohol Use: No  . Drug Use: No  . Sexually Active: Not on file   Other Topics Concern  . Not on file   Social History Narrative  . No narrative on file    Allergies  Allergen Reactions  . Advil   . Amoxicillin   . Azithromycin     REACTION: bad reaction  . Bentyl Hives  . Bromfed   . Cephalexin   . Iohexol      Code: HIVES, Desc: throat swelling no hives 20 yrs ago;needs pre-medication  09/19/07 sg, Onset Date: [id number]   . Latex   . Librax   . Paroxetine   . Penicillins   . Propoxyphene N-Acetaminophen     REACTION: swelling in the throat  . Sertraline Hcl   . Sulfadiazine   . Verapamil     Current Outpatient Prescriptions  Medication Sig Dispense Refill  . acetaminophen (TYLENOL) 500 MG tablet Take 500 mg by mouth every 6 (six) hours as needed.        . ALPRAZolam (XANAX) 1 MG tablet 1/2 to 1 tablet 3 times daily  as needed      . aluminum & magnesium hydroxide (MAALOX) 225-200 MG/5ML suspension Take by mouth every 6 (six) hours as needed.        . Ascorbic Acid (VITAMIN C) 500 MG tablet Take 500 mg by mouth daily.        Marland Kitchen aspirin 81 MG tablet Take 81 mg by mouth daily.        . bisoprolol (ZEBETA) 10 MG tablet Take 1 tablet (10 mg total) by mouth 2 (two) times daily.  60 tablet  11  . budesonide-formoterol (SYMBICORT) 160-4.5 MCG/ACT inhaler Inhale 2 puffs into the lungs 2 (two) times daily.        . Calcium Carbonate-Vitamin D (CALCIUM-CARB 600 + D) 600-125 MG-UNIT TABS Take 1 capsule by mouth daily.        . cetirizine (ZYRTEC) 10 MG tablet Take 10 mg by mouth at bedtime as needed.       . Cholecalciferol (VITAMIN D3) 400 UNITS CAPS Take 1 capsule by mouth daily.        . cycloSPORINE (RESTASIS) 0.05 % ophthalmic emulsion Place 1 drop into both eyes 2 (two) times daily.        Marland Kitchen DIOVAN 160 MG tablet take 1 tablet by mouth daily  30 tablet  3  . docusate sodium (COLACE) 50 MG  capsule Take by mouth daily.       . famotidine (PEPCID) 20 MG tablet Take 20 mg by mouth daily.        . fluticasone (FLONASE) 50 MCG/ACT nasal spray Place 2 sprays into the nose at bedtime as needed.       . furosemide (LASIX) 20 MG tablet Take 20 mg by mouth daily.        Marland Kitchen levalbuterol (XOPENEX HFA) 45 MCG/ACT inhaler Inhale 2 puffs into the lungs as needed.        . nitroGLYCERIN (NITROSTAT) 0.4 MG SL tablet Place 0.4 mg under the tongue every 5 (five) minutes as needed.        . pantoprazole (PROTONIX) 40 MG tablet Take 1 tablet (40 mg total) by mouth 2 (two) times daily.  60 tablet  3  . Polyethyl Glycol-Propyl Glycol (SYSTANE OP) Apply 1 drop to eye 2 (two) times daily.        . pravastatin (PRAVACHOL) 40 MG tablet Take 40 mg by mouth daily.        . Probiotic Product (ALIGN) 4 MG CAPS Take 1 capsule by mouth daily.  21 capsule  0  . traMADol (ULTRAM) 50 MG tablet Take 1-2 every 4 hours as needed         ROS Negative other than HPI.   PE General Appearance: well developed, well nourished in no acute distress, overweight HEENT: symmetrical face, PERRLA, good dentition  Neck: no JVD, thyromegaly, or adenopathy, trachea midline Chest: symmetric without deformity Cardiac: PMI non-displaced, RRR, normal S1, S2, no gallop or murmur Lung: clear to ausculation and percussion Vascular: all pulses full without bruits  Abdominal: nondistended, nontender, good bowel sounds, no HSM, no bruits Extremities: no cyanosis, clubbing or edema, no sign of DVT, no varicosities  Skin: normal color, no rashes Neuro: alert and oriented x 3, non-focal Pysch: normal affect Filed Vitals:   06/09/11 1604  BP: 144/86  Pulse: 75  Height: 5\' 2"  (1.575 m)  Weight: 161 lb 12.8 oz (73.392 kg)    EKG  Labs and Studies Reviewed.   Lab Results  Component Value Date  WBC 5.4 03/17/2011   HGB 13.2 05/31/2011   HCT 38.4 05/31/2011   MCV 90.3 05/31/2011   PLT 155 05/31/2011      Chemistry      Component  Value Date/Time   NA 140 05/31/2011 1408   NA 140 05/31/2011 1408   K 3.4* 05/31/2011 1408   K 3.4* 05/31/2011 1408   CL 101 05/31/2011 1408   CL 101 05/31/2011 1408   CO2 31 05/31/2011 1408   CO2 31 05/31/2011 1408   BUN 15 05/31/2011 1408   BUN 15 05/31/2011 1408   CREATININE 0.82 05/31/2011 1408   CREATININE 0.82 05/31/2011 1408      Component Value Date/Time   CALCIUM 10.1 05/31/2011 1408   CALCIUM 10.1 05/31/2011 1408   ALKPHOS 70 05/31/2011 1408   ALKPHOS 70 05/31/2011 1408   AST 18 05/31/2011 1408   AST 18 05/31/2011 1408   ALT 15 05/31/2011 1408   ALT 15 05/31/2011 1408   BILITOT 0.6 05/31/2011 1408   BILITOT 0.6 05/31/2011 1408       Lab Results  Component Value Date   CHOL  Value: 137        ATP III CLASSIFICATION:  <200     mg/dL   Desirable  960-454  mg/dL   Borderline High  >=098    mg/dL   High        07/21/1477   CHOL 155 04/05/2010   Lab Results  Component Value Date   HDL 51 07/17/2010   HDL 47.50 04/05/2010   Lab Results  Component Value Date   LDLCALC  Value: 75        Total Cholesterol/HDL:CHD Risk Coronary Heart Disease Risk Table                     Men   Women  1/2 Average Risk   3.4   3.3  Average Risk       5.0   4.4  2 X Average Risk   9.6   7.1  3 X Average Risk  23.4   11.0        Use the calculated Patient Ratio above and the CHD Risk Table to determine the patient's CHD Risk.        ATP III CLASSIFICATION (LDL):  <100     mg/dL   Optimal  295-621  mg/dL   Near or Above                    Optimal  130-159  mg/dL   Borderline  308-657  mg/dL   High  >846     mg/dL   Very High 96/10/9526   LDLCALC 89 04/05/2010   Lab Results  Component Value Date   TRIG 55 07/17/2010   TRIG 95.0 04/05/2010   Lab Results  Component Value Date   CHOLHDL 2.7 07/17/2010   CHOLHDL 3 04/05/2010   Lab Results  Component Value Date   HGBA1C  Value: 5.9 (NOTE)                                                                       According to the ADA Clinical Practice Recommendations for  2011, when HbA1c is  used as a screening test:   >=6.5%   Diagnostic of Diabetes Mellitus           (if abnormal result  is confirmed)  5.7-6.4%   Increased risk of developing Diabetes Mellitus  References:Diagnosis and Classification of Diabetes Mellitus,Diabetes Care,2011,34(Suppl 1):S62-S69 and Standards of Medical Care in         Diabetes - 2011,Diabetes Care,2011,34  (Suppl 1):S11-S61.* 07/16/2010   Lab Results  Component Value Date   ALT 15 05/31/2011   ALT 15 05/31/2011   AST 18 05/31/2011   AST 18 05/31/2011   ALKPHOS 70 05/31/2011   ALKPHOS 70 05/31/2011   BILITOT 0.6 05/31/2011   BILITOT 0.6 05/31/2011   Lab Results  Component Value Date   TSH 0.831 07/16/2010

## 2011-06-10 ENCOUNTER — Other Ambulatory Visit (INDEPENDENT_AMBULATORY_CARE_PROVIDER_SITE_OTHER): Payer: Medicare Other

## 2011-06-10 ENCOUNTER — Ambulatory Visit (INDEPENDENT_AMBULATORY_CARE_PROVIDER_SITE_OTHER): Payer: Medicare Other | Admitting: Gastroenterology

## 2011-06-10 ENCOUNTER — Encounter: Payer: Self-pay | Admitting: Gastroenterology

## 2011-06-10 DIAGNOSIS — R7309 Other abnormal glucose: Secondary | ICD-10-CM

## 2011-06-10 DIAGNOSIS — R1310 Dysphagia, unspecified: Secondary | ICD-10-CM

## 2011-06-10 DIAGNOSIS — K219 Gastro-esophageal reflux disease without esophagitis: Secondary | ICD-10-CM

## 2011-06-10 DIAGNOSIS — R070 Pain in throat: Secondary | ICD-10-CM

## 2011-06-10 LAB — HEMOGLOBIN A1C: Hgb A1c MFr Bld: 6.2 % (ref 4.6–6.5)

## 2011-06-10 MED ORDER — LIDOCAINE VISCOUS HCL 2 % MT SOLN: OROMUCOSAL | Status: DC

## 2011-06-10 NOTE — Progress Notes (Signed)
This is a somewhat complicated 60 year old Caucasian female on multiple medications with a history of multiple drug allergies. She recently had endoscopy because of complaints of solid food dysphagia. Her endoscopy on Sept 10 was fairly unremarkable. She apparently developed a rather severe sore throat within several days after endoscopy, and apparently this has resolved at this time. She denies cough, sputum production, or any upper respiratory or  pulmonary symptoms at  this time. She has no history of recurrent strep throats or autoimmune disease. She continues with dysphagia for solid foods in her mid substernal area. Currently is on Protonix 40 mg twice a day.  Current Medications, Allergies, Past Medical History, Past Surgical History, Family History and Social History were reviewed in [employer] record.  Pertinent Review of Systems Negative.. history of breast cancer with regular followup per Dr. Beatris Ship.  Physical Exam: Awake and alert in no acute distress appearing her stated age. I cannot appreciate stigmata of chronic liver disease. Examination of the throat and oropharynx is unremarkable. There is no lymphadenopathy, thyroid enlargement, or neck tenderness. Chest is clear cardiac exam is unremarkable. Mental status is normal without gross neurological deficits.    Assessment and Plan: Continue dysphagia despite negative endoscopy and negative exam for H. pylori infection. She apparently had a rather severe sore throat related to endoscopy, but there is no evidence of trauma on exam or perforation. The etiology of her problem is unclear, and I've scheduled barium swallow for diagnostic purposes. She may need manometry and 24-hour pH probe testing. Otherwise she is to continue medications as listed and reviewed in her chart per her multiple physicians. Encounter Diagnoses  Name Primary?  Marland Kitchen Dysphagia Yes  . GERD (gastroesophageal reflux disease)   . Throat pain

## 2011-06-10 NOTE — Patient Instructions (Signed)
Your Barium Swallow is scheduled for 06/14/2011 arrive at [facility] at 10:45am for a 11:00am appt. Nothing to eat or drink after 5:00am.

## 2011-06-13 ENCOUNTER — Ambulatory Visit: Payer: Medicare Other | Admitting: Internal Medicine

## 2011-06-14 ENCOUNTER — Other Ambulatory Visit (HOSPITAL_COMMUNITY): Payer: Medicare Other

## 2011-06-15 ENCOUNTER — Other Ambulatory Visit (HOSPITAL_COMMUNITY): Payer: Medicare Other

## 2011-06-16 ENCOUNTER — Telehealth: Payer: Self-pay | Admitting: Cardiology

## 2011-06-16 NOTE — Telephone Encounter (Signed)
Pt returning your call

## 2011-06-17 ENCOUNTER — Telehealth: Payer: Self-pay | Admitting: *Deleted

## 2011-06-17 LAB — POCT I-STAT, CHEM 8
Chloride: 105 mEq/L (ref 96–112)
HCT: 36 % (ref 36.0–46.0)
Hemoglobin: 12.2 g/dL (ref 12.0–15.0)
Potassium: 4 mEq/L (ref 3.5–5.1)
Sodium: 141 mEq/L (ref 135–145)

## 2011-06-17 NOTE — Telephone Encounter (Signed)
10/5---pt calling stating she received a voicemail message about her lab and weight loss, but she did not understand message, and she's not feeling well and could debbie gray call her back --advised debbie gray not here today but i would send her a message to call [name] back the next time she is in office--pt agrees--i will send to deb--nt

## 2011-06-20 NOTE — Telephone Encounter (Signed)
I spoke with pt about her Hemoglobin A1C results. She will be joining a gym for her exercise as she has many allergie and the weather is cooler. We also discussed carbohydrate counting and ADA web sites for more information. She will call the office if she has any further symptoms as when seen by Dr. Daleen Squibb recently. She will call back when she has lost weight for a repeat HbA1C.   Mylo Red RN

## 2011-08-30 ENCOUNTER — Other Ambulatory Visit: Payer: Self-pay | Admitting: Cardiology

## 2011-09-13 HISTORY — PX: COLONOSCOPY: SHX174

## 2011-09-21 ENCOUNTER — Other Ambulatory Visit: Payer: Self-pay | Admitting: Oncology

## 2011-09-21 DIAGNOSIS — Z853 Personal history of malignant neoplasm of breast: Secondary | ICD-10-CM

## 2011-10-01 ENCOUNTER — Telehealth: Payer: Self-pay | Admitting: Oncology

## 2011-10-01 NOTE — Telephone Encounter (Signed)
called pt and scheduled appt for march2013 °

## 2011-10-06 ENCOUNTER — Encounter (INDEPENDENT_AMBULATORY_CARE_PROVIDER_SITE_OTHER): Payer: Self-pay | Admitting: General Surgery

## 2011-10-09 ENCOUNTER — Other Ambulatory Visit: Payer: Self-pay | Admitting: Cardiology

## 2011-10-11 DIAGNOSIS — F411 Generalized anxiety disorder: Secondary | ICD-10-CM | POA: Diagnosis not present

## 2011-10-11 DIAGNOSIS — R3589 Other polyuria: Secondary | ICD-10-CM | POA: Diagnosis not present

## 2011-10-11 DIAGNOSIS — R358 Other polyuria: Secondary | ICD-10-CM | POA: Diagnosis not present

## 2011-10-11 DIAGNOSIS — R071 Chest pain on breathing: Secondary | ICD-10-CM | POA: Diagnosis not present

## 2011-10-24 DIAGNOSIS — K5289 Other specified noninfective gastroenteritis and colitis: Secondary | ICD-10-CM | POA: Diagnosis not present

## 2011-10-24 DIAGNOSIS — J45909 Unspecified asthma, uncomplicated: Secondary | ICD-10-CM | POA: Diagnosis not present

## 2011-10-25 DIAGNOSIS — K5289 Other specified noninfective gastroenteritis and colitis: Secondary | ICD-10-CM | POA: Diagnosis not present

## 2011-11-08 ENCOUNTER — Encounter (INDEPENDENT_AMBULATORY_CARE_PROVIDER_SITE_OTHER): Payer: Self-pay | Admitting: General Surgery

## 2011-11-08 ENCOUNTER — Ambulatory Visit (INDEPENDENT_AMBULATORY_CARE_PROVIDER_SITE_OTHER): Payer: Medicare Other | Admitting: General Surgery

## 2011-11-08 VITALS — BP 118/74 | HR 76 | Temp 97.6°F | Resp 18 | Ht 64.0 in | Wt 167.6 lb

## 2011-11-08 DIAGNOSIS — Z853 Personal history of malignant neoplasm of breast: Secondary | ICD-10-CM

## 2011-11-08 NOTE — Progress Notes (Signed)
Patient ID: Linda [name], female   DOB: 08-Jul-1951, 61 y.o.   MRN: [id number]  Chief Complaint  Patient presents with  . Follow-up    br ck after mgm    HPI Linda [name] is a 61 y.o. female.  She returned for long-term followup regarding her right breast cancer.  The patient underwent a right partial mastectomy and sentinel node biopsy on May 06, 2008, following neoadjuvant chemotherapy. Her pathologic stage was ypT2, ypN0 following neoadjuvant treatment. She has no known recurrence to date.  She underwent bilateral reduction mammoplasty by Dr. Shon Hough on Jan 22, 2011. She is pleased with her result.  She is due for mammograms on Jan 30, 2012.  She is followed by Pierce Crane. She is here for interval followup.HPI  Past Medical History  Diagnosis Date  . Myocardial infarct 2005  . CAD (coronary artery disease)   . Hypertension   . Encounter for long-term (current) use of anticoagulants   . Edema   . Hematoma   . Sinusitis acute   . Adenocarcinoma of breast     right  . Acute upper respiratory infections of unspecified site   . Depression   . Anal fissure   . GERD (gastroesophageal reflux disease)   . Dog bite   . Diverticulosis of colon (without [name] of hemorrhage)   . Spinal stenosis, lumbar region, without neurogenic claudication   . Anxiety   . Panic attacks   . Asthma   . Irritable bowel syndrome   . Hiatal hernia     Past Surgical History  Procedure Date  . Vesicovaginal fistula closure w/ tah 1987  . Cardiac catheterization 2001  . Coronary angioplasty with stent placement 2001  . Cholecystectomy   . Bladder repair     tact  . Partial hymenectomy   . Breast lumpectomy     right  . Breast reconstruction     right breast  . Breast reduction surgery     left breast  . Abdominal hysterectomy     partial    Family History  Problem Relation Age of Onset  . Hypertension Mother   . Pancreatic cancer Father   . Heart disease Mother   .  Diabetes Father   . Dementia Mother   . Arthritis Mother   . Hypertension Father   . Hypertension Sister   . Hypertension Brother   . Heart disease Sister   . Diabetes Mother     Social History History  Substance Use Topics  . Smoking status: Former Smoker -- 0.3 packs/day for 8 years    Types: Cigarettes    Quit date: 09/13/1983  . Smokeless tobacco: Never Used  . Alcohol Use: No    Allergies  Allergen Reactions  . Advil   . Amoxicillin   . Azithromycin     REACTION: bad reaction  . Bentyl Hives  . Bromfed   . Cephalexin   . Iohexol      Code: HIVES, Desc: throat swelling no hives 20 yrs ago;needs pre-medication  09/19/07 sg, Onset Date: [id number]   . Latex   . Librax   . Paroxetine   . Penicillins   . Propoxyphene N-Acetaminophen     REACTION: swelling in the throat  . Sertraline Hcl   . Sulfadiazine   . Verapamil     Current Outpatient Prescriptions  Medication Sig Dispense Refill  . acetaminophen (TYLENOL) 500 MG tablet Take 500 mg by mouth every 6 (six) hours as needed.        Marland Kitchen  ALPRAZolam (XANAX) 1 MG tablet 1/2 to 1 tablet 3 times daily as needed      . Ascorbic Acid (VITAMIN C) 500 MG tablet Take 500 mg by mouth daily.        Marland Kitchen aspirin 81 MG tablet Take 81 mg by mouth daily.        . bisoprolol (ZEBETA) 10 MG tablet Take 1 tablet (10 mg total) by mouth 2 (two) times daily.  60 tablet  11  . budesonide-formoterol (SYMBICORT) 160-4.5 MCG/ACT inhaler Inhale 2 puffs into the lungs 2 (two) times daily.        . Calcium Carbonate-Vitamin D (CALCIUM-CARB 600 + D) 600-125 MG-UNIT TABS Take 1 capsule by mouth daily.        . cetirizine (ZYRTEC) 10 MG tablet Take 10 mg by mouth at bedtime as needed.       . Cholecalciferol (VITAMIN D3) 400 UNITS CAPS Take 1 capsule by mouth daily.        . cycloSPORINE (RESTASIS) 0.05 % ophthalmic emulsion Place 1 drop into both eyes 2 (two) times daily.        Marland Kitchen DIOVAN 160 MG tablet take 1 tablet by mouth daily  30 tablet  3  .  levalbuterol (XOPENEX HFA) 45 MCG/ACT inhaler Inhale 2 puffs into the lungs as needed.        . Lidocaine HCl 2 % SOLN Use as needed for sore throat.  20 mL  0  . nitroGLYCERIN (NITROSTAT) 0.4 MG SL tablet Place 0.4 mg under the tongue every 5 (five) minutes as needed.        . pantoprazole (PROTONIX) 40 MG tablet Take 1 tablet (40 mg total) by mouth 2 (two) times daily.  60 tablet  3  . pravastatin (PRAVACHOL) 40 MG tablet Take 40 mg by mouth daily.        . Probiotic Product (ALIGN) 4 MG CAPS Take 1 capsule by mouth daily.  21 capsule  0  . traMADol (ULTRAM) 50 MG tablet Take 1-2 every 4 hours as needed         Review of Systems Review of Systems  Constitutional: Negative for fever, chills and unexpected weight change.  HENT: Negative for hearing loss, congestion, sore throat, trouble swallowing and voice change.   Eyes: Negative for visual disturbance.  Respiratory: Negative for cough and wheezing.   Cardiovascular: Negative for chest pain, palpitations and leg swelling.  Gastrointestinal: Negative for nausea, vomiting, abdominal pain, diarrhea, constipation, blood in stool, abdominal distention and anal bleeding.  Genitourinary: Negative for hematuria, vaginal bleeding and difficulty urinating.  Musculoskeletal: Negative for arthralgias.  Skin: Negative for rash and wound.  Neurological: Negative for seizures, syncope and headaches.  Hematological: Negative for adenopathy. Does not bruise/bleed easily.  Psychiatric/Behavioral: Negative for confusion.    Blood pressure 118/74, pulse 76, temperature 97.6 F (36.4 C), temperature source Temporal, resp. rate 18, height 5\' 4"  (1.626 m), weight 167 lb 9.6 oz (76.023 kg).  Physical Exam Physical Exam  Constitutional: She appears well-developed and well-nourished. No distress.  HENT:  Head: Normocephalic and atraumatic.  Eyes: Right eye exhibits no discharge. Left eye exhibits no discharge. No scleral icterus.  Neck: Normal range of  motion. Neck supple. No JVD present. No tracheal deviation present. No thyromegaly present.  Cardiovascular: Normal rate, regular rhythm, normal heart sounds and intact distal pulses.  Exam reveals no gallop and no friction rub.   No murmur heard. Pulmonary/Chest: Effort normal and breath sounds normal. No respiratory  distress. She has no wheezes. She has no rales. She exhibits no tenderness.    Lymphadenopathy:    She has no cervical adenopathy.  Skin: Skin is warm and dry. No rash noted. She is not diaphoretic. No erythema. No pallor.  Psychiatric: She has a normal mood and affect. Her behavior is normal. Judgment and thought content normal.    Data Reviewed I reviewed all of her old records.  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  No evidence of  recurrence of 3-1/2 years following neoadjuvant chemotherapy and subsequent right partial mastectomy and sentinel node  biopsy  Good result following recent bilateral reduction mammoplasty.    Plan    Bilateral mammogram scheduled for Jan 30, 2012.  She will see Dr. Donnie Coffin in March of this year  Return to see me in one year   Athelene Hursey M. Derrell Lolling, M.D., [facility] General and Minimally invasive Surgery Breast and Colorectal Surgery Office:   [phone] Pager:   [phone]        Linda [name] 11/08/2011, 5:12 PM

## 2011-11-18 ENCOUNTER — Ambulatory Visit (HOSPITAL_BASED_OUTPATIENT_CLINIC_OR_DEPARTMENT_OTHER): Payer: Medicare Other | Admitting: Oncology

## 2011-11-18 ENCOUNTER — Other Ambulatory Visit (HOSPITAL_BASED_OUTPATIENT_CLINIC_OR_DEPARTMENT_OTHER): Payer: Medicare Other | Admitting: Lab

## 2011-11-18 VITALS — BP 139/83 | HR 67 | Temp 98.0°F | Ht 64.0 in | Wt 166.2 lb

## 2011-11-18 DIAGNOSIS — D709 Neutropenia, unspecified: Secondary | ICD-10-CM

## 2011-11-18 DIAGNOSIS — D649 Anemia, unspecified: Secondary | ICD-10-CM | POA: Diagnosis not present

## 2011-11-18 DIAGNOSIS — C50419 Malignant neoplasm of upper-outer quadrant of unspecified female breast: Secondary | ICD-10-CM

## 2011-11-18 DIAGNOSIS — Z170 Estrogen receptor positive status [ER+]: Secondary | ICD-10-CM | POA: Diagnosis not present

## 2011-11-18 DIAGNOSIS — E559 Vitamin D deficiency, unspecified: Secondary | ICD-10-CM

## 2011-11-18 LAB — CBC WITH DIFFERENTIAL/PLATELET
BASO%: 0.4 % (ref 0.0–2.0)
Basophils Absolute: 0 10*3/uL (ref 0.0–0.1)
EOS%: 0.7 % (ref 0.0–7.0)
HCT: 41.4 % (ref 34.8–46.6)
HGB: 13.9 g/dL (ref 11.6–15.9)
MCH: 30.3 pg (ref 25.1–34.0)
MONO#: 0.3 10*3/uL (ref 0.1–0.9)
NEUT#: 3.9 10*3/uL (ref 1.5–6.5)
NEUT%: 72.1 % (ref 38.4–76.8)
RDW: 14.1 % (ref 11.2–14.5)
WBC: 5.4 10*3/uL (ref 3.9–10.3)
lymph#: 1.1 10*3/uL (ref 0.9–3.3)

## 2011-11-18 NOTE — Progress Notes (Signed)
Hematology and Oncology Follow Up Visit  [name] [id number] 04-21-1951 61 y.o. 11/18/2011 12:40 PM PCP CC: Vania Rea. Jarold Motto, MD, Clementeen Graham, FACP, FAGA Yaakov Guthrie. Shon Hough, M.D. Thomas C. Daleen Squibb, MD, Abbey Chatters B. Sherene Sires, MD, FCCP Aleksei V. Plotnikov, MD    Principle Diagnosis: PROBLEMS:   1. Locally advanced breast cancer, status post dose-dense FEC followed by dose-dense Taxotere and Xeloda with last cycle of chemotherapy on 02/29/2008, status post lumpectomy on 05/06/2008 with partial response, status post radiation therapy completed on 07/22/2008, ER/PR negative.  2. History of CAD.  3. Chronic low back pain. 4. History of hypertension. 5. 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  Medications: I have reviewed the patient's current medications.  Allergies:  Allergies  Allergen Reactions  . Advil   . Amoxicillin   . Azithromycin     REACTION: bad reaction  . Bentyl Hives  . Bromfed   . Cephalexin   . Iohexol      Code: HIVES, Desc: throat swelling no hives 20 yrs ago;needs pre-medication  09/19/07 sg, Onset Date: [id number]   . Latex   . Librax   . Paroxetine   . Penicillins   . Propoxyphene N-Acetaminophen     REACTION: swelling in the throat  . Sertraline Hcl   . Sulfadiazine   . Verapamil     Past Medical History, Surgical history, Social history, and Family History were reviewed and updated.  Review of Systems: Constitutional:  Negative for fever, chills, night sweats, anorexia, weight loss, pain. Cardiovascular: negative Respiratory: negative Neurological: negative Dermatological: negative ENT: negative Skin Gastrointestinal: negative Genito-Urinary: negative Hematological and Lymphatic: negative Breast: negative Musculoskeletal: negative Remaining ROS  negative.  Physical Exam: Blood pressure 139/83, pulse 67, temperature 98 F (36.7 C), temperature source Oral, height 5\' 4"  (1.626 m), weight 166 lb 3.2 oz (75.388 kg). ECOG:  General appearance: alert, cooperative and appears stated age Head: Normocephalic, without obvious abnormality, atraumatic Neck: no adenopathy, no carotid bruit, no JVD, supple, symmetrical, trachea midline and thyroid not enlarged, symmetric, no tenderness/mass/nodules Lymph nodes: Cervical, supraclavicular, and axillary nodes normal. Cardiac : regular rate and rhythm, no murmurs or gallops Pulmonary:clear to auscultation bilaterally and normal percussion bilaterally Breasts: inspection negative, no nipple discharge or bleeding, no masses or nodularity palpable and s/p bilateral reconstruction Abdomen:soft, non-tender; bowel sounds normal; no masses,  no organomegaly Extremities negative Neuro: alert, oriented, normal speech, no focal findings or movement disorder noted  Lab Results: Lab Results  Component Value Date   WBC 5.4 11/18/2011   HGB 13.9 11/18/2011   HCT 41.4 11/18/2011   MCV 90.0 11/18/2011   PLT 179 11/18/2011     Chemistry      Component Value Date/Time   NA 140 05/31/2011 1408   NA 140 05/31/2011 1408   K 3.4* 05/31/2011 1408   K 3.4* 05/31/2011 1408   CL 101 05/31/2011 1408   CL 101 05/31/2011 1408   CO2 31 05/31/2011 1408   CO2 31 05/31/2011 1408   BUN 15 05/31/2011 1408   BUN 15 05/31/2011 1408   CREATININE 0.82 05/31/2011 1408   CREATININE 0.82 05/31/2011 1408      Component Value Date/Time   CALCIUM 10.1 05/31/2011 1408   CALCIUM 10.1 05/31/2011 1408   ALKPHOS 70 05/31/2011 1408   ALKPHOS 70 05/31/2011 1408  AST 18 05/31/2011 1408   AST 18 05/31/2011 1408   ALT 15 05/31/2011 1408   ALT 15 05/31/2011 1408   BILITOT 0.6 05/31/2011 1408   BILITOT 0.6 05/31/2011 1408      .pathology. Radiological Studies: chest X-ray m/a Mammogram Due for mammogram this month Bone density n/a  Impression and  Plan: [name] is doing well , I will see her in 1 yr with appropriate imaging studies.  More than 50% of the visit was spent in patient-related counselling   Pierce Crane, MD 3/8/201312:40 PM

## 2011-11-19 LAB — COMPREHENSIVE METABOLIC PANEL
ALT: 12 U/L (ref 0–35)
AST: 14 U/L (ref 0–37)
Albumin: 4.6 g/dL (ref 3.5–5.2)
BUN: 12 mg/dL (ref 6–23)
CO2: 31 mEq/L (ref 19–32)
Calcium: 10.2 mg/dL (ref 8.4–10.5)
Chloride: 104 mEq/L (ref 96–112)
Creatinine, Ser: 0.86 mg/dL (ref 0.50–1.10)
Potassium: 3.6 mEq/L (ref 3.5–5.3)

## 2011-11-19 LAB — VITAMIN D 25 HYDROXY (VIT D DEFICIENCY, FRACTURES): Vit D, 25-Hydroxy: 38 ng/mL (ref 30–89)

## 2012-01-18 DIAGNOSIS — I10 Essential (primary) hypertension: Secondary | ICD-10-CM | POA: Diagnosis not present

## 2012-01-18 DIAGNOSIS — E785 Hyperlipidemia, unspecified: Secondary | ICD-10-CM | POA: Diagnosis not present

## 2012-01-18 DIAGNOSIS — Z853 Personal history of malignant neoplasm of breast: Secondary | ICD-10-CM | POA: Diagnosis not present

## 2012-01-18 DIAGNOSIS — R197 Diarrhea, unspecified: Secondary | ICD-10-CM | POA: Diagnosis not present

## 2012-01-18 DIAGNOSIS — K5730 Diverticulosis of large intestine without perforation or abscess without bleeding: Secondary | ICD-10-CM | POA: Diagnosis not present

## 2012-01-18 DIAGNOSIS — R1032 Left lower quadrant pain: Secondary | ICD-10-CM | POA: Diagnosis not present

## 2012-01-18 DIAGNOSIS — R109 Unspecified abdominal pain: Secondary | ICD-10-CM | POA: Diagnosis not present

## 2012-01-18 DIAGNOSIS — R509 Fever, unspecified: Secondary | ICD-10-CM | POA: Diagnosis not present

## 2012-01-30 ENCOUNTER — Ambulatory Visit
Admission: RE | Admit: 2012-01-30 | Discharge: 2012-01-30 | Disposition: A | Discharge status: Home / Self Care | Payer: Medicare Other | Source: Ambulatory Visit | Attending: Oncology | Admitting: Oncology

## 2012-01-30 DIAGNOSIS — C50919 Malignant neoplasm of unspecified site of unspecified female breast: Secondary | ICD-10-CM

## 2012-01-30 DIAGNOSIS — Z9889 Other specified postprocedural states: Secondary | ICD-10-CM

## 2012-01-30 DIAGNOSIS — Z853 Personal history of malignant neoplasm of breast: Secondary | ICD-10-CM | POA: Diagnosis not present

## 2012-02-01 DIAGNOSIS — I10 Essential (primary) hypertension: Secondary | ICD-10-CM | POA: Diagnosis not present

## 2012-02-01 DIAGNOSIS — J029 Acute pharyngitis, unspecified: Secondary | ICD-10-CM | POA: Diagnosis not present

## 2012-02-03 ENCOUNTER — Other Ambulatory Visit: Payer: Self-pay | Admitting: Cardiology

## 2012-02-03 NOTE — Telephone Encounter (Signed)
..   Requested Prescriptions   Pending Prescriptions Disp Refills  . DIOVAN 160 MG tablet [Pharmacy Med Name: DIOVAN 160 MG TABLET] 30 tablet 5    Sig: take 1 tablet by mouth daily

## 2012-03-06 DIAGNOSIS — M545 Low back pain, unspecified: Secondary | ICD-10-CM | POA: Diagnosis not present

## 2012-03-06 DIAGNOSIS — IMO0002 Reserved for concepts with insufficient information to code with codable children: Secondary | ICD-10-CM | POA: Diagnosis not present

## 2012-03-08 ENCOUNTER — Other Ambulatory Visit: Payer: Self-pay | Admitting: Anesthesiology

## 2012-03-08 DIAGNOSIS — M5417 Radiculopathy, lumbosacral region: Secondary | ICD-10-CM

## 2012-03-14 ENCOUNTER — Other Ambulatory Visit: Payer: Medicare Other

## 2012-03-14 ENCOUNTER — Ambulatory Visit
Admission: RE | Admit: 2012-03-14 | Discharge: 2012-03-14 | Disposition: A | Discharge status: Home / Self Care | Payer: Medicare Other | Source: Ambulatory Visit | Attending: Anesthesiology | Admitting: Anesthesiology

## 2012-03-14 DIAGNOSIS — M5137 Other intervertebral disc degeneration, lumbosacral region: Secondary | ICD-10-CM | POA: Diagnosis not present

## 2012-03-14 DIAGNOSIS — R32 Unspecified urinary incontinence: Secondary | ICD-10-CM | POA: Diagnosis not present

## 2012-03-14 DIAGNOSIS — M5126 Other intervertebral disc displacement, lumbar region: Secondary | ICD-10-CM | POA: Diagnosis not present

## 2012-03-14 DIAGNOSIS — M5417 Radiculopathy, lumbosacral region: Secondary | ICD-10-CM

## 2012-03-20 ENCOUNTER — Ambulatory Visit (INDEPENDENT_AMBULATORY_CARE_PROVIDER_SITE_OTHER): Payer: Medicare Other | Admitting: Psychiatry

## 2012-03-20 DIAGNOSIS — F4323 Adjustment disorder with mixed anxiety and depressed mood: Secondary | ICD-10-CM

## 2012-03-20 NOTE — Progress Notes (Signed)
03-20-2012  Patient returns to therapy after a long break.  Seen for individual psychotherapy.  Next appointment 03-27-2012.

## 2012-03-23 ENCOUNTER — Telehealth: Payer: Self-pay | Admitting: Cardiology

## 2012-03-23 NOTE — Telephone Encounter (Signed)
Fu call °Patient returning your call °

## 2012-03-23 NOTE — Telephone Encounter (Signed)
Patient wanted to verify the next appointment date and time with Dr. Daleen Squibb.

## 2012-03-26 DIAGNOSIS — M545 Low back pain, unspecified: Secondary | ICD-10-CM | POA: Diagnosis not present

## 2012-03-26 DIAGNOSIS — IMO0002 Reserved for concepts with insufficient information to code with codable children: Secondary | ICD-10-CM | POA: Diagnosis not present

## 2012-03-26 DIAGNOSIS — M533 Sacrococcygeal disorders, not elsewhere classified: Secondary | ICD-10-CM | POA: Diagnosis not present

## 2012-03-27 ENCOUNTER — Ambulatory Visit (INDEPENDENT_AMBULATORY_CARE_PROVIDER_SITE_OTHER): Payer: Medicare Other | Admitting: Psychiatry

## 2012-03-27 DIAGNOSIS — F410 Panic disorder [episodic paroxysmal anxiety] without agoraphobia: Secondary | ICD-10-CM | POA: Diagnosis not present

## 2012-03-27 DIAGNOSIS — F0630 Mood disorder due to known physiological condition, unspecified: Secondary | ICD-10-CM | POA: Diagnosis not present

## 2012-03-27 NOTE — Progress Notes (Signed)
03-27-2012  Patient seen for individual psychotherapy.  Next appointment 04-26-2012.

## 2012-03-29 ENCOUNTER — Other Ambulatory Visit: Payer: Self-pay | Admitting: Adult Health

## 2012-03-30 DIAGNOSIS — J45901 Unspecified asthma with (acute) exacerbation: Secondary | ICD-10-CM | POA: Diagnosis not present

## 2012-04-16 DIAGNOSIS — H2510 Age-related nuclear cataract, unspecified eye: Secondary | ICD-10-CM | POA: Diagnosis not present

## 2012-04-16 DIAGNOSIS — IMO0002 Reserved for concepts with insufficient information to code with codable children: Secondary | ICD-10-CM | POA: Diagnosis not present

## 2012-04-24 DIAGNOSIS — Z79899 Other long term (current) drug therapy: Secondary | ICD-10-CM | POA: Diagnosis not present

## 2012-04-24 DIAGNOSIS — Z5181 Encounter for therapeutic drug level monitoring: Secondary | ICD-10-CM | POA: Diagnosis not present

## 2012-04-24 DIAGNOSIS — M533 Sacrococcygeal disorders, not elsewhere classified: Secondary | ICD-10-CM | POA: Diagnosis not present

## 2012-04-24 DIAGNOSIS — M545 Low back pain, unspecified: Secondary | ICD-10-CM | POA: Diagnosis not present

## 2012-04-24 DIAGNOSIS — IMO0002 Reserved for concepts with insufficient information to code with codable children: Secondary | ICD-10-CM | POA: Diagnosis not present

## 2012-04-25 DIAGNOSIS — H2510 Age-related nuclear cataract, unspecified eye: Secondary | ICD-10-CM | POA: Diagnosis not present

## 2012-04-26 ENCOUNTER — Ambulatory Visit (INDEPENDENT_AMBULATORY_CARE_PROVIDER_SITE_OTHER): Payer: Medicare Other | Admitting: Psychiatry

## 2012-04-26 DIAGNOSIS — F4323 Adjustment disorder with mixed anxiety and depressed mood: Secondary | ICD-10-CM

## 2012-04-26 NOTE — Progress Notes (Signed)
04-26-2012  Patient seen for individual psychotherapy.  Next appointment 05-10-2012. 

## 2012-04-30 ENCOUNTER — Other Ambulatory Visit: Payer: Self-pay | Admitting: Adult Health

## 2012-05-01 ENCOUNTER — Encounter: Payer: Self-pay | Admitting: Gastroenterology

## 2012-05-01 ENCOUNTER — Ambulatory Visit (INDEPENDENT_AMBULATORY_CARE_PROVIDER_SITE_OTHER): Payer: Medicare Other | Admitting: Gastroenterology

## 2012-05-01 VITALS — BP 138/80 | HR 72 | Ht 64.0 in | Wt 166.4 lb

## 2012-05-01 DIAGNOSIS — K5790 Diverticulosis of intestine, part unspecified, without perforation or abscess without bleeding: Secondary | ICD-10-CM

## 2012-05-01 DIAGNOSIS — R1032 Left lower quadrant pain: Secondary | ICD-10-CM

## 2012-05-01 DIAGNOSIS — Z8719 Personal history of other diseases of the digestive system: Secondary | ICD-10-CM

## 2012-05-01 DIAGNOSIS — K5730 Diverticulosis of large intestine without perforation or abscess without bleeding: Secondary | ICD-10-CM

## 2012-05-01 DIAGNOSIS — K219 Gastro-esophageal reflux disease without esophagitis: Secondary | ICD-10-CM

## 2012-05-01 DIAGNOSIS — Z800 Family history of malignant neoplasm of digestive organs: Secondary | ICD-10-CM

## 2012-05-01 MED ORDER — MOVIPREP 100 G PO SOLR: 1.0000 | Freq: Once | ORAL | Status: DC

## 2012-05-01 NOTE — Progress Notes (Signed)
History of Present Illness:  This is a complicated 61 year old patient who has multiple sensitivities and drug allergies, multiple complaints, and multiple diagnoses with multiple physicians and multiple somatic diagnoses. Please see her problem list for a list of her problems and therapies. She has chronic GERD and apparently is doing well on daily PPI therapy. She relates that she currently receiving primary care at [facility] and was treated for acute diverticulitis in may of this past year. At that time she had severe left lower quadrant pain, and apparently an abnormal CT scan. Currently she is having fairly regular bowel movements and denies melena or hematochezia. She is concerned that she has a first cousin that had colon cancer. She has rather severe lactose intolerance, but otherwise follows a regular diet. She denies anorexia, weight loss, or other systemic complaints. She is status post cholecystectomy many years ago.  I have reviewed this patient's present history, medical and surgical past history, allergies and medications.     ROS: The remainder of the 10 point ROS is negative     Physical Exam: Blood pressure 130/80, pulse 72 and regular, and weight 166 pounds the BMI of 28.56. General well developed well nourished patient in no acute distress, appearing their stated age Eyes PERRLA, no icterus, fundoscopic exam per opthamologist Skin no lesions noted Neck supple, no adenopathy, no thyroid enlargement, no tenderness Chest clear to percussion and auscultation Heart no significant murmurs, gallops or rubs noted Abdomen no hepatosplenomegaly masses or tenderness, BS normal.  Extremities no acute joint lesions, edema, phlebitis or evidence of cellulitis. Neurologic patient oriented x 3, cranial nerves intact, no focal neurologic deficits noted. Psychological mental status normal and normal affect.  Assessment and plan: Diverticulosis coli with recent episode of  diverticulitis. I placed her on high fiber diet with daily Metamucil and liberal by mouth fluids. She saw her patient education video on diverticulosis and its management. Outpatient colonoscopy scheduled her convenience. I have not prescribed anti-spasmodic because of her multiple" reactions" in the past multiple anti-spasmodic sudden other medications. She seems to be doing well with her GERD on daily Dexilant 60 mg. Standard antireflux maneuvers reviewed. Otherwise she is to continue her medications per her multiple physicians.  Encounter Diagnoses  Name Primary?  Marland Kitchen LLQ abdominal pain Yes  . Family history of malignant neoplasm of gastrointestinal tract   . Diverticular disease

## 2012-05-01 NOTE — Patient Instructions (Addendum)
You have been scheduled for a colonoscopy with propofol. Please follow written instructions given to you at your visit today.  Please pick up your prep kit at the pharmacy within the next 1-3 days. If you use inhalers (even only as needed), please bring them with you on the day of your procedure. Please purchase Metamucil over the counter. Take as directed. High Fiber Diet A high fiber diet changes your normal diet to include more whole grains, legumes, fruits, and vegetables. Changes in the diet involve replacing refined carbohydrates with unrefined foods. The calorie level of the diet is essentially unchanged. The Dietary Reference Intake (recommended amount) for adult males is 38 g per day. For adult females, it is 25 g per day. Pregnant and lactating women should consume 28 g of fiber per day. Fiber is the intact part of a plant that is not broken down during digestion. Functional fiber is fiber that has been isolated from the plant to provide a beneficial effect in the body. PURPOSE  Increase stool bulk.   Ease and regulate bowel movements.   Lower cholesterol.  INDICATIONS THAT YOU NEED MORE FIBER  Constipation and hemorrhoids.   Uncomplicated diverticulosis (intestine condition) and irritable bowel syndrome.   Weight management.   As a protective measure against hardening of the arteries (atherosclerosis), diabetes, and cancer.  NOTE OF CAUTION If you have a digestive or bowel problem, ask your caregiver for advice before adding high fiber foods to your diet. Some of the following medical problems are such that a high fiber diet should not be used without consulting your caregiver:  Acute diverticulitis (intestine infection).   Partial small bowel obstructions.   Complicated diverticular disease involving bleeding, rupture (perforation), or abscess (boil, furuncle).   Presence of autonomic neuropathy (nerve damage) or gastric paresis (stomach cannot empty itself).  GUIDELINES  FOR INCREASING FIBER  Start adding fiber to the diet slowly. A gradual increase of about 5 more grams (2 slices of whole-wheat bread, 2 servings of most fruits or vegetables, or 1 bowl of high fiber cereal) per day is best. Too rapid an increase in fiber may result in constipation, flatulence, and bloating.   Drink enough water and fluids to keep your urine clear or pale yellow. Water, juice, or caffeine-free drinks are recommended. Not drinking enough fluid may cause constipation.   Eat a variety of high fiber foods rather than one type of fiber.   Try to increase your intake of fiber through using high fiber foods rather than fiber pills or supplements that contain small amounts of fiber.   The goal is to change the types of food eaten. Do not supplement your present diet with high fiber foods, but replace foods in your present diet.  INCLUDE A VARIETY OF FIBER SOURCES  Replace refined and processed grains with whole grains, canned fruits with fresh fruits, and incorporate other fiber sources. White rice, white breads, and most bakery goods contain little or no fiber.   Brown whole-grain rice, buckwheat oats, and many fruits and vegetables are all good sources of fiber. These include: broccoli, Brussels sprouts, cabbage, cauliflower, beets, sweet potatoes, white potatoes (skin on), carrots, tomatoes, eggplant, squash, berries, fresh fruits, and dried fruits.   Cereals appear to be the richest source of fiber. Cereal fiber is found in whole grains and bran. Bran is the fiber-rich outer coat of cereal grain, which is largely removed in refining. In whole-grain cereals, the bran remains. In breakfast cereals, the largest amount of fiber  is found in those with "bran" in their names. The fiber content is sometimes indicated on the label.   You may need to include additional fruits and vegetables each day.   In baking, for 1 cup white flour, you may use the following substitutions:   1 cup  whole-wheat flour minus 2 tbs.    cup white flour plus  cup whole-wheat flour.  Document Released: 08/29/2005 Document Revised: 08/18/2011 Document Reviewed: 07/07/2009 [facility] Patient Information 2012 [place], [place].

## 2012-05-07 ENCOUNTER — Ambulatory Visit (INDEPENDENT_AMBULATORY_CARE_PROVIDER_SITE_OTHER): Payer: Medicare Other | Admitting: Psychiatry

## 2012-05-07 ENCOUNTER — Encounter: Payer: Self-pay | Admitting: Cardiology

## 2012-05-07 ENCOUNTER — Ambulatory Visit (INDEPENDENT_AMBULATORY_CARE_PROVIDER_SITE_OTHER): Payer: Medicare Other | Admitting: Cardiology

## 2012-05-07 VITALS — BP 134/80 | HR 66 | Ht 64.0 in | Wt 165.0 lb

## 2012-05-07 DIAGNOSIS — I10 Essential (primary) hypertension: Secondary | ICD-10-CM

## 2012-05-07 DIAGNOSIS — I2510 Atherosclerotic heart disease of native coronary artery without angina pectoris: Secondary | ICD-10-CM | POA: Diagnosis not present

## 2012-05-07 DIAGNOSIS — R739 Hyperglycemia, unspecified: Secondary | ICD-10-CM

## 2012-05-07 DIAGNOSIS — R7309 Other abnormal glucose: Secondary | ICD-10-CM

## 2012-05-07 DIAGNOSIS — I252 Old myocardial infarction: Secondary | ICD-10-CM

## 2012-05-07 DIAGNOSIS — F4323 Adjustment disorder with mixed anxiety and depressed mood: Secondary | ICD-10-CM | POA: Diagnosis not present

## 2012-05-07 NOTE — Patient Instructions (Addendum)
Your physician recommends that you continue on your current medications as directed. Please refer to the Current Medication list given to you today.  Your physician wants you to follow-up in: 1 year. You will receive a reminder letter in the mail two months in advance. If you don't receive a letter, please call our office to schedule the follow-up appointment.  

## 2012-05-07 NOTE — Assessment & Plan Note (Signed)
She is borderline diabetic. I've advised her again to increase her activity, decrease carbohydrate intake, and lose about 20 pounds.

## 2012-05-07 NOTE — Assessment & Plan Note (Signed)
Stable. Continue current medical program. Return to the office in one year.

## 2012-05-07 NOTE — Progress Notes (Signed)
HPI Ms [name] comes in today for evaluation and management of her coronary artery disease and history of MI. She's very emotional today because of some social issues. Para she denies any angina or ischemic symptoms. She is compliant with her medications. She remains thirsty all the time. but her hemoglobin A1c was 6.2%. We advised her to decrease carbs and to lose 20 pounds. Para she denies orthopnea, PND or edema.  Past Medical History  Diagnosis Date  . Myocardial infarct 2005  . CAD (coronary artery disease)   . Hypertension   . Encounter for long-term (current) use of anticoagulants   . Edema   . Hematoma   . Sinusitis acute   . Adenocarcinoma of breast     right  . Acute upper respiratory infections of unspecified site   . Depression   . Anal fissure   . GERD (gastroesophageal reflux disease)   . Dog bite   . Diverticulosis of colon (without mention of hemorrhage)   . Spinal stenosis, lumbar region, without neurogenic claudication   . Anxiety   . Panic attacks   . Asthma   . Irritable bowel syndrome   . Hiatal hernia     Current Outpatient Prescriptions  Medication Sig Dispense Refill  . acetaminophen (TYLENOL) 500 MG tablet Take 500 mg by mouth every 6 (six) hours as needed.        . ALPRAZolam (XANAX) 1 MG tablet 1/2 to 1 tablet 3 times daily as needed      . Ascorbic Acid (VITAMIN C) 500 MG tablet Take 500 mg by mouth daily.        Marland Kitchen aspirin 81 MG tablet Take 81 mg by mouth daily.        . bisoprolol (ZEBETA) 10 MG tablet Take 1 tablet (10 mg total) by mouth 2 (two) times daily.  60 tablet  11  . budesonide-formoterol (SYMBICORT) 160-4.5 MCG/ACT inhaler Inhale 2 puffs into the lungs 2 (two) times daily.        . Calcium Carbonate-Vitamin D (CALCIUM-CARB 600 + D) 600-125 MG-UNIT TABS Take 1 capsule by mouth daily.        . cetirizine (ZYRTEC) 10 MG tablet Take 10 mg by mouth at bedtime as needed.       . Cholecalciferol (VITAMIN D3) 400 UNITS CAPS Take 1 capsule by mouth  daily.        . cycloSPORINE (RESTASIS) 0.05 % ophthalmic emulsion Place 1 drop into both eyes 2 (two) times daily.        Marland Kitchen DIOVAN 160 MG tablet take 1 tablet by mouth daily  30 tablet  5  . furosemide (LASIX) 20 MG tablet Take 20 mg by mouth daily.      Marland Kitchen levalbuterol (XOPENEX HFA) 45 MCG/ACT inhaler Inhale 2 puffs into the lungs as needed.        . levETIRAcetam (KEPPRA) 250 MG tablet Take 250 mg by mouth as directed.      Marland Kitchen MOVIPREP 100 G SOLR Take 1 kit (100 g total) by mouth once.  1 kit  0  . nitroGLYCERIN (NITROSTAT) 0.4 MG SL tablet Place 0.4 mg under the tongue every 5 (five) minutes as needed.        . pantoprazole (PROTONIX) 40 MG tablet take 1 tablet by mouth once daily BEFORE BREAKFAST.  30 tablet  0  . pravastatin (PRAVACHOL) 40 MG tablet Take 40 mg by mouth daily.          Allergies  Allergen  Reactions  . Amoxicillin   . Azithromycin     REACTION: bad reaction  . Bromfed   . Cephalexin   . Clidinium-Chlordiazepoxide   . Dicyclomine Hcl Hives  . Ibuprofen   . Iohexol      Code: HIVES, Desc: throat swelling no hives 20 yrs ago;needs pre-medication  09/19/07 sg, Onset Date: 16109604   . Latex   . Paroxetine   . Penicillins   . Propoxyphene-Acetaminophen     REACTION: swelling in the throat  . Sertraline Hcl   . Sulfadiazine   . Verapamil     Family History  Problem Relation Age of Onset  . Hypertension Mother   . Pancreatic cancer Father   . Heart disease Mother   . Diabetes Father   . Dementia Mother   . Arthritis Mother   . Hypertension Father   . Hypertension Sister   . Hypertension Brother   . Heart disease Sister   . Diabetes Mother     History   Social History  . Marital Status: Divorced    Spouse Name: N/A    Number of Children: 4  . Years of Education: N/A   Occupational History  . Teacher    Social History Main Topics  . Smoking status: Former Smoker -- 0.3 packs/day for 8 years    Types: Cigarettes    Quit date: 09/13/1983  .  Smokeless tobacco: Never Used  . Alcohol Use: No  . Drug Use: No  . Sexually Active: Not on file   Other Topics Concern  . Not on file   Social History Narrative  . No narrative on file    ROS ALL NEGATIVE EXCEPT THOSE NOTED IN HPI  PE  General Appearance: well developed, well nourished in no acute distress, overweight HEENT: symmetrical face, PERRLA, good dentition  Neck: no JVD, thyromegaly, or adenopathy, trachea midline Chest: symmetric without deformity Cardiac: PMI non-displaced, RRR, normal S1, S2, no gallop or murmur Lung: clear to ausculation and percussion Vascular: all pulses full without bruits  Abdominal: nondistended, nontender, good bowel sounds, no HSM, no bruits Extremities: no cyanosis, clubbing or edema, no sign of DVT, no varicosities  Skin: normal color, no rashes Neuro: alert and oriented x 3, non-focal Pysch: normal affect  EKG Normal sinus rhythm, poor R wave progression, no acute changes BMET    Component Value Date/Time   NA 142 11/18/2011 1107   K 3.6 11/18/2011 1107   CL 104 11/18/2011 1107   CO2 31 11/18/2011 1107   GLUCOSE 99 11/18/2011 1107   BUN 12 11/18/2011 1107   CREATININE 0.86 11/18/2011 1107   CALCIUM 10.2 11/18/2011 1107   GFRNONAA >60 03/05/2011 0208   GFRAA >60 03/05/2011 0208    Lipid Panel     Component Value Date/Time   CHOL  Value: 137        ATP III CLASSIFICATION:  <200     mg/dL   Desirable  540-981  mg/dL   Borderline High  >=191    mg/dL   High        47/04/2955 0550   TRIG 55 07/17/2010 0550   HDL 51 07/17/2010 0550   CHOLHDL 2.7 07/17/2010 0550   VLDL 11 07/17/2010 0550   LDLCALC  Value: 75        Total Cholesterol/HDL:CHD Risk Coronary Heart Disease Risk Table                     Men   Women  1/2 Average Risk   3.4   3.3  Average Risk       5.0   4.4  2 X Average Risk   9.6   7.1  3 X Average Risk  23.4   11.0        Use the calculated Patient Ratio above and the CHD Risk Table to determine the patient's CHD Risk.        ATP III  CLASSIFICATION (LDL):  <100     mg/dL   Optimal  914-782  mg/dL   Near or Above                    Optimal  130-159  mg/dL   Borderline  956-213  mg/dL   High  >086     mg/dL   Very High 57/04/4695 2952    CBC    Component Value Date/Time   WBC 5.4 11/18/2011 1107   WBC 5.4 03/17/2011 1118   RBC 4.60 11/18/2011 1107   RBC 4.14 03/17/2011 1118   HGB 13.9 11/18/2011 1107   HGB 12.9 03/17/2011 1118   HCT 41.4 11/18/2011 1107   HCT 37.7 03/17/2011 1118   PLT 179 11/18/2011 1107   PLT 207.0 03/17/2011 1118   MCV 90.0 11/18/2011 1107   MCV 91.0 03/17/2011 1118   MCH 30.3 11/18/2011 1107   MCH 29.1 03/05/2011 0208   MCHC 33.7 11/18/2011 1107   MCHC 34.2 03/17/2011 1118   RDW 14.1 11/18/2011 1107   RDW 13.7 03/17/2011 1118   LYMPHSABS 1.1 11/18/2011 1107   LYMPHSABS 1.3 03/17/2011 1118   MONOABS 0.3 11/18/2011 1107   MONOABS 0.4 03/17/2011 1118   EOSABS 0.0 11/18/2011 1107   EOSABS 0.0 03/17/2011 1118   BASOSABS 0.0 11/18/2011 1107   BASOSABS 0.0 03/17/2011 1118

## 2012-05-08 DIAGNOSIS — F411 Generalized anxiety disorder: Secondary | ICD-10-CM | POA: Diagnosis not present

## 2012-05-08 DIAGNOSIS — Z881 Allergy status to other antibiotic agents status: Secondary | ICD-10-CM | POA: Diagnosis not present

## 2012-05-08 DIAGNOSIS — I252 Old myocardial infarction: Secondary | ICD-10-CM | POA: Diagnosis not present

## 2012-05-08 DIAGNOSIS — I2510 Atherosclerotic heart disease of native coronary artery without angina pectoris: Secondary | ICD-10-CM | POA: Diagnosis not present

## 2012-05-08 DIAGNOSIS — Z91041 Radiographic dye allergy status: Secondary | ICD-10-CM | POA: Diagnosis not present

## 2012-05-08 DIAGNOSIS — I10 Essential (primary) hypertension: Secondary | ICD-10-CM | POA: Diagnosis not present

## 2012-05-08 DIAGNOSIS — M461 Sacroiliitis, not elsewhere classified: Secondary | ICD-10-CM | POA: Diagnosis not present

## 2012-05-08 DIAGNOSIS — F329 Major depressive disorder, single episode, unspecified: Secondary | ICD-10-CM | POA: Diagnosis not present

## 2012-05-08 DIAGNOSIS — Z888 Allergy status to other drugs, medicaments and biological substances status: Secondary | ICD-10-CM | POA: Diagnosis not present

## 2012-05-08 DIAGNOSIS — Z853 Personal history of malignant neoplasm of breast: Secondary | ICD-10-CM | POA: Diagnosis not present

## 2012-05-08 DIAGNOSIS — Z882 Allergy status to sulfonamides status: Secondary | ICD-10-CM | POA: Diagnosis not present

## 2012-05-08 DIAGNOSIS — E7800 Pure hypercholesterolemia, unspecified: Secondary | ICD-10-CM | POA: Diagnosis not present

## 2012-05-08 DIAGNOSIS — Z91040 Latex allergy status: Secondary | ICD-10-CM | POA: Diagnosis not present

## 2012-05-08 DIAGNOSIS — IMO0002 Reserved for concepts with insufficient information to code with codable children: Secondary | ICD-10-CM | POA: Diagnosis not present

## 2012-05-08 DIAGNOSIS — M533 Sacrococcygeal disorders, not elsewhere classified: Secondary | ICD-10-CM | POA: Diagnosis not present

## 2012-05-08 DIAGNOSIS — Z880 Allergy status to penicillin: Secondary | ICD-10-CM | POA: Diagnosis not present

## 2012-05-09 ENCOUNTER — Emergency Department (HOSPITAL_COMMUNITY)
Admission: EM | Admit: 2012-05-09 | Discharge: 2012-05-09 | Disposition: A | Discharge status: Home / Self Care | Payer: Medicare Other | Attending: Emergency Medicine | Admitting: Emergency Medicine

## 2012-05-09 ENCOUNTER — Encounter (HOSPITAL_COMMUNITY): Payer: Self-pay | Admitting: Emergency Medicine

## 2012-05-09 DIAGNOSIS — K5730 Diverticulosis of large intestine without perforation or abscess without bleeding: Secondary | ICD-10-CM | POA: Diagnosis not present

## 2012-05-09 DIAGNOSIS — Z7982 Long term (current) use of aspirin: Secondary | ICD-10-CM | POA: Diagnosis not present

## 2012-05-09 DIAGNOSIS — T7840XA Allergy, unspecified, initial encounter: Secondary | ICD-10-CM | POA: Insufficient documentation

## 2012-05-09 DIAGNOSIS — F411 Generalized anxiety disorder: Secondary | ICD-10-CM | POA: Insufficient documentation

## 2012-05-09 DIAGNOSIS — L299 Pruritus, unspecified: Secondary | ICD-10-CM | POA: Diagnosis not present

## 2012-05-09 DIAGNOSIS — Z833 Family history of diabetes mellitus: Secondary | ICD-10-CM | POA: Diagnosis not present

## 2012-05-09 DIAGNOSIS — J45909 Unspecified asthma, uncomplicated: Secondary | ICD-10-CM | POA: Diagnosis not present

## 2012-05-09 DIAGNOSIS — Z888 Allergy status to other drugs, medicaments and biological substances status: Secondary | ICD-10-CM | POA: Insufficient documentation

## 2012-05-09 DIAGNOSIS — K219 Gastro-esophageal reflux disease without esophagitis: Secondary | ICD-10-CM | POA: Insufficient documentation

## 2012-05-09 DIAGNOSIS — Z87891 Personal history of nicotine dependence: Secondary | ICD-10-CM | POA: Diagnosis not present

## 2012-05-09 DIAGNOSIS — Z880 Allergy status to penicillin: Secondary | ICD-10-CM | POA: Diagnosis not present

## 2012-05-09 DIAGNOSIS — Z8249 Family history of ischemic heart disease and other diseases of the circulatory system: Secondary | ICD-10-CM | POA: Diagnosis not present

## 2012-05-09 DIAGNOSIS — Z881 Allergy status to other antibiotic agents status: Secondary | ICD-10-CM | POA: Insufficient documentation

## 2012-05-09 DIAGNOSIS — Z8261 Family history of arthritis: Secondary | ICD-10-CM | POA: Diagnosis not present

## 2012-05-09 DIAGNOSIS — I2510 Atherosclerotic heart disease of native coronary artery without angina pectoris: Secondary | ICD-10-CM | POA: Insufficient documentation

## 2012-05-09 DIAGNOSIS — Z800 Family history of malignant neoplasm of digestive organs: Secondary | ICD-10-CM | POA: Insufficient documentation

## 2012-05-09 DIAGNOSIS — Z7901 Long term (current) use of anticoagulants: Secondary | ICD-10-CM | POA: Diagnosis not present

## 2012-05-09 DIAGNOSIS — I10 Essential (primary) hypertension: Secondary | ICD-10-CM | POA: Insufficient documentation

## 2012-05-09 DIAGNOSIS — Z8489 Family history of other specified conditions: Secondary | ICD-10-CM | POA: Insufficient documentation

## 2012-05-09 DIAGNOSIS — X58XXXA Exposure to other specified factors, initial encounter: Secondary | ICD-10-CM | POA: Insufficient documentation

## 2012-05-09 MED ORDER — DIPHENHYDRAMINE HCL 50 MG/ML IJ SOLN: 12.5000 mg | Freq: Once | INTRAMUSCULAR | Status: DC

## 2012-05-09 MED ORDER — DIPHENHYDRAMINE HCL 50 MG/ML IJ SOLN
12.5000 mg | Freq: Once | INTRAMUSCULAR | Status: AC
  Administered 2012-05-09: 12.5 mg via INTRAVENOUS
  Filled 2012-05-09: qty 1

## 2012-05-09 MED ORDER — DIPHENHYDRAMINE HCL 50 MG/ML IJ SOLN
25.0000 mg | Freq: Once | INTRAMUSCULAR | Status: AC
  Administered 2012-05-09: 25 mg via INTRAVENOUS
  Filled 2012-05-09: qty 1

## 2012-05-09 MED ORDER — METHYLPREDNISOLONE SODIUM SUCC 125 MG IJ SOLR
125.0000 mg | Freq: Once | INTRAMUSCULAR | Status: AC
  Administered 2012-05-09: 125 mg via INTRAVENOUS
  Filled 2012-05-09: qty 2

## 2012-05-09 MED ORDER — FAMOTIDINE IN NACL 20-0.9 MG/50ML-% IV SOLN
20.0000 mg | Freq: Once | INTRAVENOUS | Status: AC
  Administered 2012-05-09: 20 mg via INTRAVENOUS
  Filled 2012-05-09: qty 50

## 2012-05-09 NOTE — ED Notes (Signed)
Pt presenting to ed with c/o possible allergic reaction. Pt states she received an injection at the pain clinic yesterday and she's not sure if she's having a reaction. Pt states she did not eat anything unusual this morning. Pt states she has positive nausea no vomiting and she's itching all over.

## 2012-05-09 NOTE — ED Provider Notes (Signed)
History     CSN: [id number]  Arrival date & time 05/09/12  0945   First MD Initiated Contact with Patient 05/09/12 1008      Chief Complaint  Patient presents with  . Allergic Reaction    (Consider location/radiation/quality/duration/timing/severity/associated sxs/prior treatment) HPI The patient his a 61 year old female with a history of several allergic reactions that presents to the ED with a suspected allergic reaction.  She reports significant pruritis this morning about 2 hours ago.  She states that the pruritus began to spread all over her body.  She also reports significant malaise.  She denies fever, chills, headache, dizziness, chest pain, palpitations, SOB, wheezing, and urinary symptoms.  She also denies taking any new medications, changing medication doses, eating/drinking any new food or drink, or using any different soaps/skin care products.  She does report receiving a Kenalog steroid injection in her lumbar spine yesterday.  She was injected with bupivacaine for local anesthesia.   Past Medical History  Diagnosis Date  . Myocardial infarct 2005  . CAD (coronary artery disease)   . Hypertension   . Encounter for long-term (current) use of anticoagulants   . Edema   . Hematoma   . Sinusitis acute   . Adenocarcinoma of breast     right  . Acute upper respiratory infections of unspecified site   . Depression   . Anal fissure   . GERD (gastroesophageal reflux disease)   . Dog bite   . Diverticulosis of colon (without mention of hemorrhage)   . Spinal stenosis, lumbar region, without neurogenic claudication   . Anxiety   . Panic attacks   . Asthma   . Irritable bowel syndrome   . Hiatal hernia     Past Surgical History  Procedure Date  . Vesicovaginal fistula closure w/ tah 1987  . Cardiac catheterization 2001  . Coronary angioplasty with stent placement 2001  . Cholecystectomy   . Bladder repair     tact  . Partial hymenectomy   . Breast lumpectomy    right  . Breast reconstruction     right breast  . Breast reduction surgery     left breast  . Abdominal hysterectomy     partial    Family History  Problem Relation Age of Onset  . Hypertension Mother   . Pancreatic cancer Father   . Heart disease Mother   . Diabetes Father   . Dementia Mother   . Arthritis Mother   . Hypertension Father   . Hypertension Sister   . Hypertension Brother   . Heart disease Sister   . Diabetes Mother     History  Substance Use Topics  . Smoking status: Former Smoker -- 0.3 packs/day for 8 years    Types: Cigarettes    Quit date: 09/13/1983  . Smokeless tobacco: Never Used  . Alcohol Use: No    OB History    Grav Para Term Preterm Abortions TAB SAB Ect Mult Living                  Review of Systems All other systems negative except as documented in the HPI. All pertinent positives and negatives as reviewed in the HPI.   Allergies  Amoxicillin; Azithromycin; Bromfed; Cephalexin; Clidinium-chlordiazepoxide; Dicyclomine hcl; Ibuprofen; Iohexol; Latex; Paroxetine; Penicillins; Propoxyphene-acetaminophen; Sertraline hcl; Sulfadiazine; and Verapamil  Home Medications   Current Outpatient Rx  Name Route Sig Dispense Refill  . ACETAMINOPHEN 500 MG PO TABS Oral Take 500 mg by  mouth every 6 (six) hours as needed. pain    . ALPRAZOLAM 1 MG PO TABS Oral Take 1 mg by mouth 3 (three) times daily as needed. 1/2 to 1 tablet 3 times daily as needed anxiety    . VITAMIN C 500 MG PO TABS Oral Take 500 mg by mouth daily.      . ASPIRIN 81 MG PO TABS Oral Take 81 mg by mouth daily.      Marland Kitchen BISOPROLOL FUMARATE 10 MG PO TABS Oral Take 1 tablet (10 mg total) by mouth 2 (two) times daily. 60 tablet 11  . BUDESONIDE-FORMOTEROL FUMARATE 160-4.5 MCG/ACT IN AERO Inhalation Inhale 2 puffs into the lungs 2 (two) times daily.      Marland Kitchen CALCIUM CARBONATE-VITAMIN D 600-125 MG-UNIT PO TABS Oral Take 1 capsule by mouth daily.      Marland Kitchen VITAMIN D3 400 UNITS PO CAPS Oral  Take 1 capsule by mouth daily.      . CYCLOSPORINE 0.05 % OP EMUL Both Eyes Place 1 drop into both eyes 2 (two) times daily.      . FUROSEMIDE 20 MG PO TABS Oral Take 20 mg by mouth daily.    Marland Kitchen LEVALBUTEROL TARTRATE 45 MCG/ACT IN AERO Inhalation Inhale 2 puffs into the lungs as needed.      Marland Kitchen PANTOPRAZOLE SODIUM 40 MG PO TBEC Oral Take 40 mg by mouth daily.    Marland Kitchen PRAVASTATIN SODIUM 40 MG PO TABS Oral Take 40 mg by mouth daily.      Marland Kitchen VALSARTAN 160 MG PO TABS Oral Take 160 mg by mouth daily.      BP 151/89  Pulse 62  Temp 98.7 F (37.1 C) (Oral)  Resp 16  SpO2 100%  Physical Exam  Constitutional: She is oriented to person, place, and time. She appears well-developed and well-nourished.  HENT:  Head: Normocephalic and atraumatic.  Eyes: Conjunctivae and EOM are normal. Pupils are equal, round, and reactive to light. Right eye exhibits no discharge. Left eye exhibits no discharge.  Neck: Normal range of motion. Neck supple. No thyromegaly present.  Cardiovascular: Normal rate, regular rhythm, normal heart sounds and intact distal pulses.   No murmur heard. Pulmonary/Chest: Effort normal and breath sounds normal. No respiratory distress. She has no wheezes. She has no rales. She exhibits no tenderness.  Abdominal: There is no hepatosplenomegaly. There is no CVA tenderness.  Musculoskeletal: Normal range of motion.  Lymphadenopathy:    She has no cervical adenopathy.  Neurological: She is alert and oriented to person, place, and time.  Skin: Skin is warm and dry. No rash noted.  Psychiatric: Her speech is normal. Judgment normal. Her mood appears anxious. Cognition and memory are normal.    ED Course  Procedures (including critical care time)   The patients presented to the ED with a suspected allergic reaction. Patient is having just itching. She received an ECG to assess potential cardiac issues.  It revealed normal sinus rhythm with no abnormalities.  The patient has no difficulty  breathing, airway issues, or cardiovascular problems. The patient was given Benadryl, famotidine, and methylprednisolone for antihistamine and anti-inflammatory.  She responded well to the medications stating that her itching has decreased and she is feeling better overall.        MDM  The patient was initially evaluated to rule out anaphylaxis and determined to have a minor allergic reaction.  She was given dual antihistamine treatment as well as a one time steroid dose for treatment which  alleviated her symptoms.          Carlyle Dolly, PA-C 05/09/12 1420

## 2012-05-09 NOTE — ED Notes (Signed)
Pt reports mouth is dry but no SOB or swelling to face or neck. Pt maintaining O2 sats on room air. MM pink and moist.

## 2012-05-10 ENCOUNTER — Ambulatory Visit: Payer: Medicare Other | Admitting: Psychiatry

## 2012-05-11 ENCOUNTER — Encounter: Payer: Self-pay | Admitting: Gastroenterology

## 2012-05-11 ENCOUNTER — Ambulatory Visit (AMBULATORY_SURGERY_CENTER): Payer: Medicare Other | Admitting: Gastroenterology

## 2012-05-11 VITALS — BP 149/98 | HR 68 | Temp 97.2°F | Resp 15 | Ht 64.0 in | Wt 166.0 lb

## 2012-05-11 DIAGNOSIS — K589 Irritable bowel syndrome without diarrhea: Secondary | ICD-10-CM | POA: Diagnosis not present

## 2012-05-11 DIAGNOSIS — Z8719 Personal history of other diseases of the digestive system: Secondary | ICD-10-CM | POA: Diagnosis not present

## 2012-05-11 DIAGNOSIS — E669 Obesity, unspecified: Secondary | ICD-10-CM | POA: Diagnosis not present

## 2012-05-11 DIAGNOSIS — Z889 Allergy status to unspecified drugs, medicaments and biological substances status: Secondary | ICD-10-CM

## 2012-05-11 DIAGNOSIS — R1032 Left lower quadrant pain: Secondary | ICD-10-CM | POA: Diagnosis not present

## 2012-05-11 DIAGNOSIS — Z1211 Encounter for screening for malignant neoplasm of colon: Secondary | ICD-10-CM | POA: Diagnosis not present

## 2012-05-11 MED ORDER — SODIUM CHLORIDE 0.9 % IV SOLN: 500.0000 mL | INTRAVENOUS | Status: DC

## 2012-05-11 NOTE — Op Note (Signed)
[facility] [address] [place], [address]   COLONOSCOPY PROCEDURE REPORT  PATIENT: [name], [name]  MR#: [id number] BIRTHDATE: 1951/06/20 , 61  yrs. old GENDER: Female ENDOSCOPIST: Mardella Layman, MD, [facility] REFERRED BY: PROCEDURE DATE:  05/11/2012 PROCEDURE:   Colonoscopy, screening ASA CLASS:   Class II INDICATIONS:average risk patient for colon cancer and abdominal pain in the lower left quadrant. MEDICATIONS: Propofol (Diprivan) 140 mg IV  DESCRIPTION OF PROCEDURE:   After the risks and benefits and of the procedure were explained, informed consent was obtained.  A digital rectal exam revealed no abnormalities of the rectum.    The LB [id number]  endoscope was introduced through the anus and advanced to the cecum, which was identified by both the appendix and ileocecal valve .  The quality of the prep was excellent, using MoviPrep .  The instrument was then slowly withdrawn as the colon was fully examined.     COLON FINDINGS: Moderate diverticulosis was noted in the descending colon and sigmoid colon.   The colon was otherwise normal.  There was no diverticulosis, inflamation, polyps or cancers unless previously stated.     Retroflexed views revealed no abnormalities. The scope was then withdrawn from the patient and the procedure completed.  COMPLICATIONS: There were no complications. ENDOSCOPIC IMPRESSION: 1.   Moderate diverticulosis was noted in the descending colon and sigmoid colon 2.   The colon was otherwise normal  RECOMMENDATIONS: 1.  High fiber diet 2.  Continue current colorectal screening recommendations for "routine risk" patients with a repeat colonoscopy in 10 years.   REPEAT EXAM:  cc:Judy Pimple, MD and Gaylord Shih, MD  _______________________________ eSigned:  Mardella Layman, MD, [facility] 05/11/2012 2:50 PM     PATIENT NAME:  [name], [name] MR#: [id number]

## 2012-05-11 NOTE — Patient Instructions (Addendum)
Discharge instructions given with verbal understanding. Handouts on diverticulosis and a high fiber diet given. Resume previous medications.YOU HAD AN ENDOSCOPIC PROCEDURE TODAY AT THE [facility]: Refer to the procedure report that was given to you for any specific questions about what was found during the examination.  If the procedure report does not answer your questions, please call your gastroenterologist to clarify.  If you requested that your care partner not be given the details of your procedure findings, then the procedure report has been included in a sealed envelope for you to review at your convenience later.  YOU SHOULD EXPECT: Some feelings of bloating in the abdomen. Passage of more gas than usual.  Walking can help get rid of the air that was put into your GI tract during the procedure and reduce the bloating. If you had a lower endoscopy (such as a colonoscopy or flexible sigmoidoscopy) you may notice spotting of blood in your stool or on the toilet paper. If you underwent a bowel prep for your procedure, then you may not have a normal bowel movement for a few days.  DIET: Your first meal following the procedure should be a light meal and then it is ok to progress to your normal diet.  A half-sandwich or bowl of soup is an example of a good first meal.  Heavy or fried foods are harder to digest and may make you feel nauseous or bloated.  Likewise meals heavy in dairy and vegetables can cause extra gas to form and this can also increase the bloating.  Drink plenty of fluids but you should avoid alcoholic beverages for 24 hours.  ACTIVITY: Your care partner should take you home directly after the procedure.  You should plan to take it easy, moving slowly for the rest of the day.  You can resume normal activity the day after the procedure however you should NOT DRIVE or use heavy machinery for 24 hours (because of the sedation medicines used during the test).    SYMPTOMS TO  REPORT IMMEDIATELY: A gastroenterologist can be reached at any hour.  During normal business hours, 8:30 AM to 5:00 PM Monday through Friday, call [phone].  After hours and on weekends, please call the GI answering service at [phone] who will take a message and have the physician on call contact you.   Following lower endoscopy (colonoscopy or flexible sigmoidoscopy):  Excessive amounts of blood in the stool  Significant tenderness or worsening of abdominal pains  Swelling of the abdomen that is new, acute  Fever of 100F or higher  FOLLOW UP: If any biopsies were taken you will be contacted by phone or by letter within the next 1-3 weeks.  Call your gastroenterologist if you have not heard about the biopsies in 3 weeks.  Our staff will call the home number listed on your records the next business day following your procedure to check on you and address any questions or concerns that you may have at that time regarding the information given to you following your procedure. This is a courtesy call and so if there is no answer at the home number and we have not heard from you through the emergency physician on call, we will assume that you have returned to your regular daily activities without incident.  SIGNATURES/CONFIDENTIALITY: You and/or your care partner have signed paperwork which will be entered into your electronic medical record.  These signatures attest to the fact that that the information above on your   After Visit Summary has been reviewed and is understood.  Full responsibility of the confidentiality of this discharge information lies with you and/or your care-partner.   

## 2012-05-11 NOTE — ED Provider Notes (Signed)
Medical screening examination/treatment/procedure(s) were conducted as a shared visit with non-physician practitioner(s) and myself.  I personally evaluated the patient during the encounter.  Pt appeared nontoxic.  No evidence of airway compromise observed.  Agree with plan as described.  Tobin Chad, MD 05/11/12 [phone]

## 2012-05-11 NOTE — Progress Notes (Signed)
Patient did not experience any of the following events: a burn prior to discharge; a fall within the facility; wrong site/side/patient/procedure/implant event; or a hospital transfer or hospital admission upon discharge from the facility. (G8907) Patient did not have preoperative order for IV antibiotic SSI prophylaxis. (G8918)  

## 2012-05-12 ENCOUNTER — Other Ambulatory Visit: Payer: Self-pay | Admitting: Internal Medicine

## 2012-05-12 MED ORDER — CLOTRIMAZOLE 10 MG MT TROC: 10.0000 mg | Freq: Every day | OROMUCOSAL | Status: DC

## 2012-05-12 NOTE — Progress Notes (Signed)
Patient called and c/o oral thrush. Sore mouth. Hx of thrush years ago andlike this.  mycelex troche rx.  Advised go to urgent care and/or PCP if persists.

## 2012-05-14 ENCOUNTER — Encounter (HOSPITAL_COMMUNITY): Payer: Self-pay | Admitting: *Deleted

## 2012-05-14 ENCOUNTER — Emergency Department (HOSPITAL_COMMUNITY)
Admission: EM | Admit: 2012-05-14 | Discharge: 2012-05-14 | Disposition: A | Discharge status: Home / Self Care | Payer: Medicare Other | Attending: Emergency Medicine | Admitting: Emergency Medicine

## 2012-05-14 DIAGNOSIS — B370 Candidal stomatitis: Secondary | ICD-10-CM

## 2012-05-14 DIAGNOSIS — I2510 Atherosclerotic heart disease of native coronary artery without angina pectoris: Secondary | ICD-10-CM | POA: Insufficient documentation

## 2012-05-14 DIAGNOSIS — K219 Gastro-esophageal reflux disease without esophagitis: Secondary | ICD-10-CM | POA: Diagnosis not present

## 2012-05-14 DIAGNOSIS — K589 Irritable bowel syndrome without diarrhea: Secondary | ICD-10-CM | POA: Diagnosis not present

## 2012-05-14 DIAGNOSIS — Z7901 Long term (current) use of anticoagulants: Secondary | ICD-10-CM | POA: Diagnosis not present

## 2012-05-14 DIAGNOSIS — R070 Pain in throat: Secondary | ICD-10-CM | POA: Diagnosis not present

## 2012-05-14 DIAGNOSIS — J45909 Unspecified asthma, uncomplicated: Secondary | ICD-10-CM | POA: Diagnosis not present

## 2012-05-14 DIAGNOSIS — Z87891 Personal history of nicotine dependence: Secondary | ICD-10-CM | POA: Insufficient documentation

## 2012-05-14 DIAGNOSIS — J029 Acute pharyngitis, unspecified: Secondary | ICD-10-CM

## 2012-05-14 DIAGNOSIS — I252 Old myocardial infarction: Secondary | ICD-10-CM | POA: Diagnosis not present

## 2012-05-14 DIAGNOSIS — J020 Streptococcal pharyngitis: Secondary | ICD-10-CM | POA: Diagnosis not present

## 2012-05-14 DIAGNOSIS — I10 Essential (primary) hypertension: Secondary | ICD-10-CM | POA: Insufficient documentation

## 2012-05-14 MED ORDER — FLUCONAZOLE 100 MG PO TABS: 100.0000 mg | ORAL_TABLET | Freq: Every day | ORAL | Status: AC

## 2012-05-14 MED ORDER — FLUCONAZOLE 200 MG PO TABS
200.0000 mg | ORAL_TABLET | Freq: Once | ORAL | Status: AC
  Administered 2012-05-14: 200 mg via ORAL
  Filled 2012-05-14: qty 1

## 2012-05-14 NOTE — ED Provider Notes (Signed)
History     CSN: [id number]  Arrival date & time 05/14/12  1116   First MD Initiated Contact with Patient 05/14/12 1235      No chief complaint on file.   (Consider location/radiation/quality/duration/timing/severity/associated sxs/prior treatment) HPI Comments: [name] 61 y.o. female   The chief complaint is: No chief complaint on file.   The patient has medical history significant for:   Past Medical History:   Myocardial infarct                              2005         CAD (coronary artery disease)                                Hypertension                                                 Encounter for long-term (current) use of antic*              Edema                                                        Hematoma                                                     Sinusitis acute                                              Adenocarcinoma of breast                                       Comment:right   Acute upper respiratory infections of unspecif*              Depression                                                   Anal fissure                                                 GERD (gastroesophageal reflux disease)                       Dog bite  Diverticulosis of colon (without mention of he*              Spinal stenosis, lumbar region, without neurog*              Anxiety                                                      Panic attacks                                                Asthma                                                       Irritable bowel syndrome                                     Hiatal hernia                                                Jaundice                                                       Comment:Hx of Jaundice at age 75 from "dirty               restuarant". Unsure of Hepatitis type  Patient presents with a 3 day history of thrush and sore throat. Patient states  that she frequently uses inhaled steroid but does not have a spacer. Patient was given clotrimazole troches from her PCP, but states that they are not helping. Denies fever, chills. Denies NVD or abdominal pain. Denies SOB or dysphagia.      The history is provided by the patient.    Past Medical History  Diagnosis Date  . Myocardial infarct 2005  . CAD (coronary artery disease)   . Hypertension   . Encounter for long-term (current) use of anticoagulants   . Edema   . Hematoma   . Sinusitis acute   . Adenocarcinoma of breast     right  . Acute upper respiratory infections of unspecified site   . Depression   . Anal fissure   . GERD (gastroesophageal reflux disease)   . Dog bite   . Diverticulosis of colon (without mention of hemorrhage)   . Spinal stenosis, lumbar region, without neurogenic claudication   . Anxiety   . Panic attacks   . Asthma   . Irritable bowel syndrome   . Hiatal hernia   . Jaundice     Hx of Jaundice at age 35 from "dirty restuarant". Unsure of Hepatitis type    Past Surgical History  Procedure Date  . Vesicovaginal fistula  closure w/ tah 1987  . Cardiac catheterization 2001  . Coronary angioplasty with stent placement 2001  . Cholecystectomy   . Bladder repair     tact  . Partial hymenectomy   . Breast lumpectomy     right  . Breast reconstruction     right breast  . Breast reduction surgery     left breast  . Abdominal hysterectomy     partial    Family History  Problem Relation Age of Onset  . Hypertension Mother   . Pancreatic cancer Father   . Heart disease Mother   . Diabetes Father   . Dementia Mother   . Arthritis Mother   . Hypertension Father   . Hypertension Sister   . Hypertension Brother   . Heart disease Sister   . Diabetes Mother     History  Substance Use Topics  . Smoking status: Former Smoker -- 0.3 packs/day for 8 years    Types: Cigarettes    Quit date: 09/13/1983  . Smokeless tobacco: Never Used  .  Alcohol Use: No    OB History    Grav Para Term Preterm Abortions TAB SAB Ect Mult Living                  Review of Systems  Constitutional: Negative for fever and chills.  HENT: Positive for sore throat, mouth sores and trouble swallowing.   Respiratory: Positive for shortness of breath.   Gastrointestinal: Negative for nausea, vomiting, abdominal pain and diarrhea.    Allergies  Amoxicillin; Azithromycin; Bromfed; Cephalexin; Clidinium-chlordiazepoxide; Dicyclomine hcl; Ibuprofen; Iohexol; Latex; Lidocaine; Paroxetine; Penicillins; Propoxyphene-acetaminophen; Sertraline hcl; Sulfadiazine; and Verapamil  Home Medications   Current Outpatient Rx  Name Route Sig Dispense Refill  . ACETAMINOPHEN 500 MG PO TABS Oral Take 500 mg by mouth every 6 (six) hours as needed. pain    . ALBUTEROL SULFATE HFA 108 (90 BASE) MCG/ACT IN AERS Inhalation Inhale 2 puffs into the lungs every 6 (six) hours as needed. For shortness of breath    . ALPRAZOLAM 1 MG PO TABS Oral Take 1 mg by mouth 3 (three) times daily as needed. 1/2 to 1 tablet 3 times daily as needed anxiety    . VITAMIN C 500 MG PO TABS Oral Take 500 mg by mouth daily.      . ASPIRIN 81 MG PO TABS Oral Take 81 mg by mouth daily.      . BECLOMETHASONE DIPROPIONATE 40 MCG/ACT IN AERS Inhalation Inhale 2 puffs into the lungs 2 (two) times daily.    Marland Kitchen BISOPROLOL FUMARATE 10 MG PO TABS Oral Take 1 tablet (10 mg total) by mouth 2 (two) times daily. 60 tablet 11  . CALCIUM CARBONATE-VITAMIN D 600-125 MG-UNIT PO TABS Oral Take 1 capsule by mouth daily.      Marland Kitchen CETIRIZINE HCL 10 MG PO TABS Oral Take 10 mg by mouth daily.    Marland Kitchen CLOTRIMAZOLE 10 MG MT TROC Oral Take 10 mg by mouth 5 (five) times daily.    . CYCLOSPORINE 0.05 % OP EMUL Both Eyes Place 1 drop into both eyes 2 (two) times daily.      . FUROSEMIDE 20 MG PO TABS Oral Take 20 mg by mouth daily.    . ADULT MULTIVITAMIN W/MINERALS CH Oral Take 1 tablet by mouth daily.    Marland Kitchen OVER THE COUNTER  MEDICATION Oral Take 1 capsule by mouth daily. [facility] probiotic digestive care: benificail bacteria, with b, infantis 10mg     .  PRAVASTATIN SODIUM 40 MG PO TABS Oral Take 40 mg by mouth daily.      Marland Kitchen VALSARTAN 160 MG PO TABS Oral Take 160 mg by mouth daily.    Marland Kitchen VITAMIN E 400 UNITS PO CAPS Oral Take 400 Units by mouth daily.      BP 150/85  Pulse 77  Temp 98.2 F (36.8 C) (Oral)  SpO2 96%  Physical Exam  Nursing note and vitals reviewed. Constitutional: She appears well-developed and well-nourished. She appears distressed.  HENT:  Head: Normocephalic and atraumatic.       Oropharynx has significant areas of candida, in addition to areas on the tounge.  Eyes: Conjunctivae are normal. No scleral icterus.  Neck: Normal range of motion. Neck supple.  Cardiovascular: Normal rate, regular rhythm and normal heart sounds.   Pulmonary/Chest: Effort normal and breath sounds normal.  Abdominal: Soft. Bowel sounds are normal. There is no tenderness.  Lymphadenopathy:    She has no cervical adenopathy.  Neurological: She is alert.  Skin: Skin is warm and dry.    ED Course  Procedures (including critical care time)  Labs Reviewed - No data to display No results found.   1. Thrush   2. Sore throat       MDM  Patient presented with sore throat secondary to thrush. Patient used inhaled steroid for her asthma. Patient encourage to ask her primary care about a spacer for thrush prevention. Patient given first dose of fluconazole today and will continue for 13 days. Return precautions given. No red flags for bacterial pharyngitis or peritonsillar abscess.        Pixie Casino, PA-C 05/14/12 1413

## 2012-05-14 NOTE — ED Notes (Signed)
Sore throat ? Thrush  White coating in mouth and throat. Pain in rt upper tooth/jaw area

## 2012-05-14 NOTE — ED Provider Notes (Signed)
Medical screening examination/treatment/procedure(s) were performed by non-physician practitioner and as supervising physician I was immediately available for consultation/collaboration.   Laray Anger, DO 05/14/12 1840

## 2012-05-15 ENCOUNTER — Other Ambulatory Visit: Payer: Self-pay | Admitting: Gastroenterology

## 2012-05-15 ENCOUNTER — Telehealth: Payer: Self-pay | Admitting: *Deleted

## 2012-05-15 NOTE — Telephone Encounter (Signed)
  Follow up Call-  Call back number 05/11/2012 05/23/2011  Post procedure Call Back phone  # [phone]  Permission to leave phone message Yes -     Patient questions:  Do you have a fever, pain , or abdominal swelling? yes  Patient states that she went to the ER due to a reaction to the meds that the GI doc gave her for white "stuff" that was in her throat.  Patient states that her fever was 99.4, then 100. States feels okay today. Pain Score  0 *   Have you tolerated food without any problems? yes  Have you been able to return to your normal activities? yes  Do you have any questions about your discharge instructions: Diet   no Medications  yes  States that the doc on call and ER gave her fluconazole for the "white stuff" in her mouth; states mouth is better. Follow up visit  no  Do you have questions or concerns about your Care? no  Actions: * If pain score is 4 or above: No action needed, pain <4.

## 2012-05-16 DIAGNOSIS — H25049 Posterior subcapsular polar age-related cataract, unspecified eye: Secondary | ICD-10-CM | POA: Diagnosis not present

## 2012-05-16 DIAGNOSIS — Z961 Presence of intraocular lens: Secondary | ICD-10-CM | POA: Diagnosis not present

## 2012-05-16 DIAGNOSIS — H2510 Age-related nuclear cataract, unspecified eye: Secondary | ICD-10-CM | POA: Diagnosis not present

## 2012-05-17 ENCOUNTER — Encounter (HOSPITAL_COMMUNITY): Payer: Self-pay | Admitting: *Deleted

## 2012-05-17 ENCOUNTER — Emergency Department (HOSPITAL_COMMUNITY)
Admission: EM | Admit: 2012-05-17 | Discharge: 2012-05-17 | Disposition: A | Discharge status: Home / Self Care | Payer: Medicare Other | Attending: Emergency Medicine | Admitting: Emergency Medicine

## 2012-05-17 DIAGNOSIS — Z9889 Other specified postprocedural states: Secondary | ICD-10-CM | POA: Diagnosis not present

## 2012-05-17 DIAGNOSIS — R21 Rash and other nonspecific skin eruption: Secondary | ICD-10-CM | POA: Diagnosis not present

## 2012-05-17 DIAGNOSIS — K219 Gastro-esophageal reflux disease without esophagitis: Secondary | ICD-10-CM | POA: Insufficient documentation

## 2012-05-17 DIAGNOSIS — T413X5A Adverse effect of local anesthetics, initial encounter: Secondary | ICD-10-CM | POA: Insufficient documentation

## 2012-05-17 DIAGNOSIS — K589 Irritable bowel syndrome without diarrhea: Secondary | ICD-10-CM | POA: Diagnosis not present

## 2012-05-17 DIAGNOSIS — Z87891 Personal history of nicotine dependence: Secondary | ICD-10-CM | POA: Diagnosis not present

## 2012-05-17 DIAGNOSIS — I252 Old myocardial infarction: Secondary | ICD-10-CM | POA: Insufficient documentation

## 2012-05-17 DIAGNOSIS — I2510 Atherosclerotic heart disease of native coronary artery without angina pectoris: Secondary | ICD-10-CM | POA: Diagnosis not present

## 2012-05-17 DIAGNOSIS — I10 Essential (primary) hypertension: Secondary | ICD-10-CM | POA: Insufficient documentation

## 2012-05-17 DIAGNOSIS — Z7901 Long term (current) use of anticoagulants: Secondary | ICD-10-CM | POA: Insufficient documentation

## 2012-05-17 DIAGNOSIS — T7840XA Allergy, unspecified, initial encounter: Secondary | ICD-10-CM | POA: Insufficient documentation

## 2012-05-17 MED ORDER — DIPHENHYDRAMINE HCL 25 MG PO CAPS
ORAL_CAPSULE | ORAL | Status: AC
  Administered 2012-05-17: 50 mg via ORAL
  Filled 2012-05-17: qty 2

## 2012-05-17 MED ORDER — DIPHENHYDRAMINE HCL 25 MG PO CAPS: 25.0000 mg | ORAL_CAPSULE | Freq: Four times a day (QID) | ORAL | Status: DC

## 2012-05-17 MED ORDER — DIPHENHYDRAMINE HCL 25 MG PO CAPS
ORAL_CAPSULE | ORAL | Status: AC
  Filled 2012-05-17: qty 1

## 2012-05-17 MED ORDER — DIPHENHYDRAMINE HCL 25 MG PO CAPS
50.0000 mg | ORAL_CAPSULE | Freq: Once | ORAL | Status: AC
  Administered 2012-05-17: 50 mg via ORAL

## 2012-05-17 MED ORDER — FAMOTIDINE 20 MG PO TABS
20.0000 mg | ORAL_TABLET | Freq: Once | ORAL | Status: AC
  Administered 2012-05-17: 20 mg via ORAL
  Filled 2012-05-17: qty 1

## 2012-05-17 MED ORDER — PREDNISONE (PAK) 10 MG PO TABS: 10.0000 mg | ORAL_TABLET | Freq: Every day | ORAL | Status: AC

## 2012-05-17 MED ORDER — FAMOTIDINE 20 MG PO TABS: 20.0000 mg | ORAL_TABLET | Freq: Two times a day (BID) | ORAL | Status: DC

## 2012-05-17 MED ORDER — PREDNISONE 20 MG PO TABS
60.0000 mg | ORAL_TABLET | Freq: Once | ORAL | Status: AC
  Administered 2012-05-17: 60 mg via ORAL
  Filled 2012-05-17: qty 3

## 2012-05-17 NOTE — ED Notes (Signed)
Pt reports having cataract procedure done yesterday morning. Sts MD used lidocaine which she is allergic to. C/o itching all over body since yesterday. Has not taken any antihistamines at home. Sts she has taken benadryl before with no adverse reaction. Pt given 50mg  benadryl PO in triage. No resp distress, swelling or symptoms noted, 99% RA.

## 2012-05-17 NOTE — ED Provider Notes (Signed)
History     CSN: [id number]  Arrival date & time 05/17/12  1140   First MD Initiated Contact with Patient 05/17/12 1424      Chief Complaint  Patient presents with  . Allergic Reaction    (Consider location/radiation/quality/duration/timing/severity/associated sxs/prior treatment) HPI Comments: Patient reports she had cataract surgery on her right eye yesterday, states she told the staff prior to the operation that she was allergic to lidocaine.  Despite this, lidocaine was used - though she was given benadryl to help with itching reaction she has had in the past.  Pt states that overnight she had an asthma attack that subsided with inhalers.  This morning she began having itching, first on her bilateral anterior thighs, then spread to the rest of her body.  Reports mild erythematous rash over extremities and has itching over entire body and face.  Denies any itching or swelling in her mouth, tongue, or throat.  Denies any difficulty breathing or swallowing.    Patient is a 61 y.o. female presenting with allergic reaction. The history is provided by the patient.  Allergic Reaction The primary symptoms are  rash. The primary symptoms do not include wheezing, shortness of breath or cough.  Significant symptoms that are not present include eye redness.    Past Medical History  Diagnosis Date  . Myocardial infarct 2005  . CAD (coronary artery disease)   . Hypertension   . Encounter for long-term (current) use of anticoagulants   . Edema   . Hematoma   . Sinusitis acute   . Adenocarcinoma of breast     right  . Acute upper respiratory infections of unspecified site   . Depression   . Anal fissure   . GERD (gastroesophageal reflux disease)   . Dog bite   . Diverticulosis of colon (without mention of hemorrhage)   . Spinal stenosis, lumbar region, without neurogenic claudication   . Anxiety   . Panic attacks   . Asthma   . Irritable bowel syndrome   . Hiatal hernia   . Jaundice      Hx of Jaundice at age 59 from "dirty restuarant". Unsure of Hepatitis type    Past Surgical History  Procedure Date  . Vesicovaginal fistula closure w/ tah 1987  . Cardiac catheterization 2001  . Coronary angioplasty with stent placement 2001  . Cholecystectomy   . Bladder repair     tact  . Partial hymenectomy   . Breast lumpectomy     right  . Breast reconstruction     right breast  . Breast reduction surgery     left breast  . Abdominal hysterectomy     partial  . Colonoscopy   . Cataract extraction     Family History  Problem Relation Age of Onset  . Hypertension Mother   . Pancreatic cancer Father   . Heart disease Mother   . Diabetes Father   . Dementia Mother   . Arthritis Mother   . Hypertension Father   . Hypertension Sister   . Hypertension Brother   . Heart disease Sister   . Diabetes Mother     History  Substance Use Topics  . Smoking status: Former Smoker -- 0.3 packs/day for 8 years    Types: Cigarettes    Quit date: 09/13/1983  . Smokeless tobacco: Never Used  . Alcohol Use: No    OB History    Grav Para Term Preterm Abortions TAB SAB Ect Mult Living  Review of Systems  Constitutional: Negative for fever.  HENT: Negative for sore throat and trouble swallowing.   Eyes: Negative for pain, redness and visual disturbance.  Respiratory: Negative for cough, shortness of breath, wheezing and stridor.   Cardiovascular: Negative for chest pain.  Skin: Positive for rash.    Allergies  Amoxicillin; Azithromycin; Bromfed; Cephalexin; Clidinium-chlordiazepoxide; Clotrimazole; Dicyclomine hcl; Ibuprofen; Iohexol; Latex; Lidocaine; Paroxetine; Penicillins; Propoxyphene-acetaminophen; Sertraline hcl; Sulfadiazine; and Verapamil  Home Medications   Current Outpatient Rx  Name Route Sig Dispense Refill  . ACETAMINOPHEN 500 MG PO TABS Oral Take 500 mg by mouth every 6 (six) hours as needed. pain    . ALBUTEROL SULFATE HFA 108 (90  BASE) MCG/ACT IN AERS Inhalation Inhale 2 puffs into the lungs every 6 (six) hours as needed. For shortness of breath    . ALPRAZOLAM 1 MG PO TABS Oral Take 1 mg by mouth 3 (three) times daily as needed. 1/2 to 1 tablet 3 times daily as needed anxiety    . VITAMIN C 500 MG PO TABS Oral Take 500 mg by mouth daily.      . ASPIRIN 81 MG PO TABS Oral Take 81 mg by mouth daily.      . BECLOMETHASONE DIPROPIONATE 40 MCG/ACT IN AERS Inhalation Inhale 2 puffs into the lungs 2 (two) times daily.    Marland Kitchen BISOPROLOL FUMARATE 10 MG PO TABS Oral Take 1 tablet (10 mg total) by mouth 2 (two) times daily. 60 tablet 11  . BROMFENAC SODIUM 0.07 % OP SOLN Ophthalmic Apply 1 drop to eye daily. Right eye    . CALCIUM CARBONATE-VITAMIN D 600-125 MG-UNIT PO TABS Oral Take 1 capsule by mouth daily.      Marland Kitchen CETIRIZINE HCL 10 MG PO TABS Oral Take 10 mg by mouth daily.    . CYCLOSPORINE 0.05 % OP EMUL Both Eyes Place 1 drop into both eyes 2 (two) times daily.      Marland Kitchen FLUCONAZOLE 100 MG PO TABS Oral Take 1 tablet (100 mg total) by mouth daily. For 13 days 13 tablet 0  . FUROSEMIDE 20 MG PO TABS Oral Take 20 mg by mouth daily.    . ADULT MULTIVITAMIN W/MINERALS CH Oral Take 1 tablet by mouth daily.    . OFLOXACIN 0.3 % OP SOLN Right Eye Place 1 drop into the right eye 2 (two) times daily.    Marland Kitchen OVER THE COUNTER MEDICATION Oral Take 1 capsule by mouth daily. [facility] probiotic digestive care: benificail bacteria, with b, infantis 10mg     . PANTOPRAZOLE SODIUM 40 MG PO TBEC  TAKE 1 TABLET TWICE DAILY. 60 tablet 11  . PRAVASTATIN SODIUM 40 MG PO TABS Oral Take 40 mg by mouth daily.      Marland Kitchen VALSARTAN 160 MG PO TABS Oral Take 160 mg by mouth daily.    Marland Kitchen VITAMIN E 400 UNITS PO CAPS Oral Take 400 Units by mouth daily.    Marland Kitchen DIPHENHYDRAMINE HCL 25 MG PO CAPS Oral Take 1 capsule (25 mg total) by mouth every 6 (six) hours. 30 capsule 0  . FAMOTIDINE 20 MG PO TABS Oral Take 1 tablet (20 mg total) by mouth 2 (two) times daily. X 3 days then as  needed 30 tablet 0  . PREDNISONE (PAK) 10 MG PO TABS Oral Take 1 tablet (10 mg total) by mouth daily. Day 1: take 6 tabs.  Day 2: 5 tabs  Day 3: 4 tabs  Day 4: 3 tabs  Day 5:  2 tabs  Day 6: 1 tab 21 tablet 0    BP 158/85  Pulse 55  Temp 98.1 F (36.7 C) (Oral)  Resp 14  SpO2 98%  Physical Exam  Nursing note and vitals reviewed. Constitutional: She appears well-developed and well-nourished. No distress.  HENT:  Head: Normocephalic and atraumatic.  Mouth/Throat: Uvula is midline and oropharynx is clear and moist. No uvula swelling. No oropharyngeal exudate, posterior oropharyngeal edema or posterior oropharyngeal erythema.  Neck: Neck supple.  Cardiovascular: Normal rate and regular rhythm.   Pulmonary/Chest: Effort normal and breath sounds normal. No respiratory distress. She has no wheezes. She has no rales.  Neurological: She is alert.  Skin: She is not diaphoretic.    ED Course  Procedures (including critical care time)  Labs Reviewed - No data to display No results found.   1. Allergic reaction       MDM  Pt with mild erythematous rash on extremities, c/o diffuse pruritis.  No airway concerns, lungs CTAB.  Pt given prednisone, pepcid, benadryl in ED.  D/C home with same, PCP and close f/u with ophthalmology.  Discussed diagnosis and care plan with patient.  Pt given return precautions.  Pt verbalizes understanding and agrees with plan.           [place], [place] 05/17/12 [phone]

## 2012-05-17 NOTE — ED Notes (Signed)
Pt will call for ride due to slight sedation

## 2012-05-21 NOTE — ED Provider Notes (Signed)
Medical screening examination/treatment/procedure(s) were performed by non-physician practitioner and as supervising physician I was immediately available for consultation/collaboration.   Suzi Roots, MD 05/21/12 [phone]

## 2012-05-23 DIAGNOSIS — H2510 Age-related nuclear cataract, unspecified eye: Secondary | ICD-10-CM | POA: Diagnosis not present

## 2012-05-26 ENCOUNTER — Other Ambulatory Visit: Payer: Self-pay | Admitting: Cardiology

## 2012-05-31 ENCOUNTER — Ambulatory Visit (INDEPENDENT_AMBULATORY_CARE_PROVIDER_SITE_OTHER): Payer: Medicare Other | Admitting: Psychiatry

## 2012-05-31 DIAGNOSIS — F4323 Adjustment disorder with mixed anxiety and depressed mood: Secondary | ICD-10-CM | POA: Diagnosis not present

## 2012-06-04 NOTE — Progress Notes (Signed)
05-31-2012  Patient seen for individual psychotherapy.  Next appointment 06-14-2012.

## 2012-06-06 ENCOUNTER — Telehealth: Payer: Self-pay | Admitting: Cardiology

## 2012-06-06 DIAGNOSIS — F411 Generalized anxiety disorder: Secondary | ICD-10-CM | POA: Diagnosis not present

## 2012-06-06 DIAGNOSIS — Z01419 Encounter for gynecological examination (general) (routine) without abnormal findings: Secondary | ICD-10-CM | POA: Diagnosis not present

## 2012-06-06 DIAGNOSIS — Z124 Encounter for screening for malignant neoplasm of cervix: Secondary | ICD-10-CM | POA: Diagnosis not present

## 2012-06-06 DIAGNOSIS — J309 Allergic rhinitis, unspecified: Secondary | ICD-10-CM | POA: Diagnosis not present

## 2012-06-06 DIAGNOSIS — I10 Essential (primary) hypertension: Secondary | ICD-10-CM | POA: Diagnosis not present

## 2012-06-06 NOTE — Telephone Encounter (Signed)
Pt. states that she has developed an allergy to Lidocaine since her last OV with Dr. Daleen Squibb on 05-07-12. She states that her first reaction happened when  Dr. Marca Ancona at the pain clinic gave her Lidocaine prior to an injection. Pt. states that the day after injection her face was as "red as blood" and hot and that she almost collapsed. She went to ED at [facility] (pt. states that her BP was 180/181) and was told she is now allergic to Lidocaine and for her to notify her MD's of allergy, she states her BP was 132/76 when she left ED. Pt. states that she went to have her cataract surgery and advised anesthesiologist that she is allergic to Lidocaine but he used it anyway which caused another allergic reaction. She later went for appt. with Dr. Sherrin Daisy and her BP was elevated (?), he wanted to increase her BP meds but pt. refused b/c she wanted him to talk with Dr. Daleen Squibb before any changes were made in her medications. Pt. wants to know if Dr. Sherrin Daisy has spoke with Dr. Daleen Squibb concerning this and if not he can be reached at [phone]. Per pt. please call her cell phone if you have questions.

## 2012-06-06 NOTE — Telephone Encounter (Signed)
Pt has been having a problem with her b/p and she had to go see Dr. Sherrin Daisy at [facility] and he wanted to give her more b/p meds and she refused until he talks to Dr. Daleen Squibb and he was suppose to be contacting Dr.Wall and pt wants to know when that is done

## 2012-06-06 NOTE — Telephone Encounter (Signed)
Patient called no answer.LMTC. 

## 2012-06-07 NOTE — Telephone Encounter (Signed)
Have her come by the office to check her blood pressure either tomorrow next week. Make sure she takes her meds before she comes. No changes for now.

## 2012-06-08 ENCOUNTER — Ambulatory Visit (INDEPENDENT_AMBULATORY_CARE_PROVIDER_SITE_OTHER): Payer: Medicare Other

## 2012-06-08 ENCOUNTER — Telehealth: Payer: Self-pay | Admitting: Cardiology

## 2012-06-08 VITALS — BP 152/94 | HR 96 | Ht 64.0 in | Wt 166.0 lb

## 2012-06-08 DIAGNOSIS — M545 Low back pain, unspecified: Secondary | ICD-10-CM | POA: Diagnosis not present

## 2012-06-08 DIAGNOSIS — I10 Essential (primary) hypertension: Secondary | ICD-10-CM

## 2012-06-08 DIAGNOSIS — R0989 Other specified symptoms and signs involving the circulatory and respiratory systems: Secondary | ICD-10-CM

## 2012-06-08 DIAGNOSIS — IMO0002 Reserved for concepts with insufficient information to code with codable children: Secondary | ICD-10-CM | POA: Diagnosis not present

## 2012-06-08 DIAGNOSIS — M533 Sacrococcygeal disorders, not elsewhere classified: Secondary | ICD-10-CM | POA: Diagnosis not present

## 2012-06-08 NOTE — Telephone Encounter (Signed)
Attempted to return pt's call 3 times, no answer.  Left vm.

## 2012-06-08 NOTE — Telephone Encounter (Signed)
I spoke with pt this morning and she will come in later today for a bp check. Mylo Red RN

## 2012-06-08 NOTE — Telephone Encounter (Signed)
Pt calls today b/c she is upset about an apparent possible cyst near her heart. Her [facility] doctor told her about this.She states no xrays had been made. She is clearly upset about the discussion the [facility] md had with her but we have not received her records.  She is extremely anxious.    She was in earlier for a blood pressure check 152/94. She was anxious then even after resting.   After much reassurance she will sign a release of medical information either at our office or with the [facility] md.s office. Once information received Dr. Daleen Squibb will be able to review this. Pt is agreeable.  Also discussed resuming her blood pressure monitoring at home. She will take her Zebeta 7am & 7 pm.  Recheck her blood pressure 2-3 hours later and keep this log.  She is still taking her Diovan.  Reassurance given Mylo Red RN

## 2012-06-08 NOTE — Telephone Encounter (Signed)
New Problem:    Patient called in because she called Dr. Kizzie Ide office and was unable to reach them because they had left early on Friday.  Her PCP was supposed to send a fax but it did not make it to our office.  Patient wants to see Dr. Daleen Squibb as soon as possible. Please call back.

## 2012-06-08 NOTE — Progress Notes (Signed)
**Note De-Identified [name] Obfuscation** Pt's BP today is 152/94. Pt. states that she took her meds this morning at 7 am. She c/o elevated BP, "spells of nausea, fatigue and headaches" that she first thought was an allergic reaction to Lidocaine but she no longer thinks that is the problem. She states that Dr. Marca Ancona told her today that " I have a cyst beside one of my main arteries around my heart" she states that he told her the name but she cannot remember. Pt. given our fax number and asked to call Dr. Kizzie Ide office and request that they fax over the notes from her last couple of visits with him and any test results as well, she states she will request that those records be faxed here.  She wants to see Dr. Daleen Squibb as soon as possible. Please advise.

## 2012-06-08 NOTE — Telephone Encounter (Signed)
Pt calls to day

## 2012-06-14 ENCOUNTER — Ambulatory Visit: Payer: Medicare Other | Admitting: Psychiatry

## 2012-06-14 ENCOUNTER — Telehealth: Payer: Self-pay | Admitting: Cardiology

## 2012-06-14 NOTE — Telephone Encounter (Signed)
Pt calls today with blood pressure reading and more information on "cyst". We have not received any records as of today.  Her blood pressure today is 169/96  She is taking her medications as directed.  She talked with her pain management Dr. 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  Asked if she had discussed this with her pcp Dr. Marland Kitchen  [place] in [place].  Pt said this would be something that would need to be "addressed by her heart doctor since her blood pressure was going up" as pt was told to her by Dr. Mckinley Jewel ( pain management doctor)  Appt. With Lawson Fiscal NP on Tuesday 06/19/12 same day as Dr. Daleen Squibb is in office. Mylo Red RN

## 2012-06-14 NOTE — Telephone Encounter (Signed)
Pt would like to speak with nurse regarding medical questions.  She can be reached at [phone]

## 2012-06-19 ENCOUNTER — Encounter: Payer: Self-pay | Admitting: Nurse Practitioner

## 2012-06-19 ENCOUNTER — Ambulatory Visit (INDEPENDENT_AMBULATORY_CARE_PROVIDER_SITE_OTHER): Payer: Medicare Other | Admitting: Nurse Practitioner

## 2012-06-19 VITALS — BP 158/100 | HR 71 | Ht 64.0 in | Wt 168.8 lb

## 2012-06-19 DIAGNOSIS — I10 Essential (primary) hypertension: Secondary | ICD-10-CM

## 2012-06-19 LAB — BASIC METABOLIC PANEL
BUN: 17 mg/dL (ref 6–23)
CO2: 31 mEq/L (ref 19–32)
Calcium: 9.5 mg/dL (ref 8.4–10.5)
Chloride: 104 mEq/L (ref 96–112)
Creatinine, Ser: 0.8 mg/dL (ref 0.4–1.2)
GFR: 75.8 mL/min (ref >60.00)
Glucose, Bld: 94 mg/dL (ref 70–99)
Potassium: 3.4 mEq/L — ABNORMAL LOW (ref 3.5–5.1)
Sodium: 140 mEq/L (ref 135–145)

## 2012-06-19 MED ORDER — HYDRALAZINE HCL 25 MG PO TABS: 25.0000 mg | ORAL_TABLET | Freq: Two times a day (BID) | ORAL | Status: DC

## 2012-06-19 MED ORDER — NITROGLYCERIN 0.4 MG SL SUBL: 0.4000 mg | SUBLINGUAL_TABLET | SUBLINGUAL | Status: DC | PRN

## 2012-06-19 NOTE — Progress Notes (Signed)
[name] Date of Birth: Jul 31, 1951 Medical Record #[id number]  [name] is seen today for a work in visit. [name] is seen for Dr. Daleen Squibb. Her history is noted below.  [name] comes in today. [name] is here alone. [name] is complaining of an elevated BP. Says "everything has been a mess" since her visit here in August. [name] has been to the ER twice in September with a lidocaine allergy and asthma issues. No chest pain. No NTG use. Weight is going up. Some swelling. Uses sea salt. Says her blood pressure is staying 150 to 160/100. Has had some headaches. With her allergic reaction to lidocaine, [name] was flushing. 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    Current Outpatient Prescriptions on File Prior to Visit  Medication Sig Dispense Refill  . acetaminophen (TYLENOL) 500 MG tablet Take 500 mg by mouth every 6 (six) hours as needed. pain      . albuterol (PROVENTIL HFA;VENTOLIN HFA) 108 (90 BASE) MCG/ACT inhaler Inhale 2 puffs into the lungs every 6 (six) hours as needed. For shortness of breath      . ALPRAZolam (XANAX) 1 MG tablet Take 1 mg by mouth 3 (three) times daily as needed. 1/2 to 1 tablet 3 times daily as needed anxiety      . Ascorbic Acid (VITAMIN C) 500 MG tablet Take 500 mg by mouth daily.        Marland Kitchen aspirin 81 MG tablet Take 81 mg by mouth daily.        . beclomethasone (QVAR) 40 MCG/ACT inhaler Inhale 2 puffs into the lungs 2 (two) times daily.      . bisoprolol (ZEBETA) 10 MG tablet TAKE 1 TABLET TWICE DAILY.  60 tablet  11  . Bromfenac Sodium (PROLENSA) 0.07 % SOLN Apply 1 drop to eye daily. Left eye      . Calcium Carbonate-Vitamin D (CALCIUM-CARB 600 + D) 600-125 MG-UNIT TABS Take 1 capsule by mouth daily.        . cetirizine (ZYRTEC ALLERGY) 10 MG tablet Take 10 mg by mouth daily.      . cycloSPORINE (RESTASIS) 0.05 % ophthalmic emulsion Place 1 drop into both eyes 2 (two) times daily.        . diphenhydrAMINE (BENADRYL) 25 mg capsule Take 1 capsule (25 mg total) by mouth  every 6 (six) hours.  30 capsule  0  . furosemide (LASIX) 20 MG tablet Take 20 mg by mouth daily.      . Multiple Vitamin (MULTIVITAMIN WITH MINERALS) TABS Take 1 tablet by mouth daily.      Marland Kitchen ofloxacin (OCUFLOX) 0.3 % ophthalmic solution Place 1 drop into the left eye 2 (two) times daily.       Marland Kitchen OVER THE COUNTER MEDICATION Take 1 capsule by mouth daily. [facility] probiotic digestive care: benificail bacteria, with b, infantis 10mg       . pravastatin (PRAVACHOL) 40 MG tablet Take 40 mg by mouth daily.        . valsartan (DIOVAN) 160 MG tablet Take 160 mg by mouth daily.      . vitamin E (VITAMIN E) 400 UNIT capsule Take 400 Units by mouth daily.        Allergies  Allergen Reactions  . Amoxicillin Swelling    Throat Swells  . Azithromycin Swelling    Throat Swelling  . Bromfed Swelling    Throat Swelling  . Cephalexin Swelling    Throat Swelling  . Clidinium-Chlordiazepoxide Swelling  Throat Swelling  . Clotrimazole Other (See Comments) and Hypertension    Patient told me that [name] couldn't swallow due to the medication  . Dicyclomine Hcl Hives  . Ibuprofen     N/V  . Iohexol      Code: HIVES, Desc: throat swelling no hives 20 yrs ago;needs pre-medication  09/19/07 sg, Onset Date: [id number]   . Latex Swelling    Blisters on Skin  . Lidocaine Hives  . Paroxetine Swelling    Throat Swelling  . Penicillins Hives  . Propoxyphene-Acetaminophen Swelling    REACTION: swelling in the throat  . Sertraline Hcl Swelling    Throat Swelling  . Sulfadiazine Swelling    Throat Swelling  . Verapamil Swelling    Throat Swelling    Past Medical History  Diagnosis Date  . Myocardial infarct 2005  . CAD (coronary artery disease)   . Hypertension   . Long term (current) use of anticoagulants   . Edema   . Hematoma   . Sinusitis acute   . Adenocarcinoma of breast     right  . Acute upper respiratory infections of unspecified site   . Depression   . Anal fissure   . GERD  (gastroesophageal reflux disease)   . Dog bite(E906.0)   . Diverticulosis of colon (without mention of hemorrhage)   . Spinal stenosis, lumbar region, without neurogenic claudication   . Anxiety   . Panic attacks   . Asthma   . Irritable bowel syndrome   . Hiatal hernia   . Jaundice     Hx of Jaundice at age 68 from "dirty restuarant". Unsure of Hepatitis type    Past Surgical History  Procedure Date  . Vesicovaginal fistula closure w/ tah 1987  . Cardiac catheterization 2001  . Coronary angioplasty with stent placement 2001  . Cholecystectomy   . Bladder repair     tact  . Partial hymenectomy   . Breast lumpectomy     right  . Breast reconstruction     right breast  . Breast reduction surgery     left breast  . Abdominal hysterectomy     partial  . Colonoscopy   . Cataract extraction     History  Smoking status  . Former Smoker -- 0.3 packs/day for 8 years  . Types: Cigarettes  . Quit date: 09/13/1983  Smokeless tobacco  . Never Used    History  Alcohol Use No    Family History  Problem Relation Age of Onset  . Hypertension Mother   . Pancreatic cancer Father   . Heart disease Mother   . Diabetes Father   . Dementia Mother   . Arthritis Mother   . Hypertension Father   . Hypertension Sister   . Hypertension Brother   . Heart disease Sister   . Diabetes Mother     Review of Systems: The review of systems is per the HPI.  All other systems were reviewed and are negative.  Physical Exam: BP 158/100  Pulse 71  Ht 5\' 4"  (1.626 m)  Wt 168 lb 12.8 oz (76.567 kg)  BMI 28.97 kg/m2 Patient is very pleasant and in no acute distress. [name] seems a little anxious. Skin is warm and dry. Color is normal.  HEENT is unremarkable. Normocephalic/atraumatic. PERRL. Sclera are nonicteric. Neck is supple. No masses. No JVD. Lungs are clear. Cardiac exam shows a regular rate and rhythm. Abdomen is soft. Extremities are without edema. Gait and ROM are intact. No  gross  neurologic deficits noted.  LABORATORY DATA: PENDING  Lab Results  Component Value Date   WBC 5.4 11/18/2011   HGB 13.9 11/18/2011   HCT 41.4 11/18/2011   PLT 179 11/18/2011   GLUCOSE 99 11/18/2011   CHOL  Value: 137        ATP III CLASSIFICATION:  <200     mg/dL   Desirable  960-454  mg/dL   Borderline High  >=098    mg/dL   High        07/21/1477   TRIG 55 07/17/2010   HDL 51 07/17/2010   LDLCALC  Value: 75        Total Cholesterol/HDL:CHD Risk Coronary Heart Disease Risk Table                     Men   Women  1/2 Average Risk   3.4   3.3  Average Risk       5.0   4.4  2 X Average Risk   9.6   7.1  3 X Average Risk  23.4   11.0        Use the calculated Patient Ratio above and the CHD Risk Table to determine the patient's CHD Risk.        ATP III CLASSIFICATION (LDL):  <100     mg/dL   Optimal  295-621  mg/dL   Near or Above                    Optimal  130-159  mg/dL   Borderline  308-657  mg/dL   High  >846     mg/dL   Very High 96/10/9526   ALT 12 11/18/2011   AST 14 11/18/2011   NA 142 11/18/2011   K 3.6 11/18/2011   CL 104 11/18/2011   CREATININE 0.86 11/18/2011   BUN 12 11/18/2011   CO2 31 11/18/2011   TSH 0.831 07/16/2010   INR 1.11 07/16/2010   HGBA1C 6.2 06/10/2011     Assessment / Plan:   HTN - not controlled. I have asked her to cut out the salt. We will add Hydralazine 25 mg BID. I have asked her to monitor her blood pressure at home and keep a diary. I will see her back in about 3 weeks and check her cuff as well. We will check renal function today and send a 24 hour urine for catecholamines/metanephrines.   Patient is agreeable to this plan and will call if any problems develop in the interim.

## 2012-06-19 NOTE — Patient Instructions (Signed)
We are adding Hydralazine 25 mg two times a day  Monitor your blood pressure at home. Record your readings and bring in for review  We are checking labs today and a 24 hour urine  I will see you in about 3 weeks. Bring in your cuff for review  Call the [facility] office at [phone] if you have any questions, problems or concerns.

## 2012-06-21 ENCOUNTER — Ambulatory Visit (INDEPENDENT_AMBULATORY_CARE_PROVIDER_SITE_OTHER): Payer: Medicare Other | Admitting: Psychiatry

## 2012-06-21 ENCOUNTER — Telehealth: Payer: Self-pay | Admitting: Nurse Practitioner

## 2012-06-21 DIAGNOSIS — F4323 Adjustment disorder with mixed anxiety and depressed mood: Secondary | ICD-10-CM

## 2012-06-21 NOTE — Telephone Encounter (Signed)
Returned call again.  LMOM. Vista Mink, CMA.

## 2012-06-21 NOTE — Telephone Encounter (Signed)
F/u  Returning call back to nurse.  

## 2012-06-22 MED ORDER — VALSARTAN 160 MG PO TABS: 160.0000 mg | ORAL_TABLET | Freq: Two times a day (BID) | ORAL | Status: DC

## 2012-06-22 NOTE — Telephone Encounter (Signed)
Pt calls today b/c she is allergic to the Hydralazine she started on Tuesday. States she has a rash on her neck, chest, and arms. Itching all over. She consulted her pharmacist and has been taking Benadryl with little relief. Pt did not stop taking the Hydralazine until yesterday after speaking with her pharmacist.  Norma Fredrickson reviewed. Recommended pt increase Diovan to 160mg  bid. Decrease sodium intake. Hydralazine discontinued and documented as an allergy.  Pt agrees with this. Prescription e-scribed. Mylo Red RN

## 2012-06-22 NOTE — Telephone Encounter (Signed)
Pt had a reaction to the new b/p medication and she is rtning a call to [facility] you can call home or mobile

## 2012-07-02 LAB — CATECHOLAMINES, FRACTIONATED, URINE, 24 HOUR
Dopamine, 24 hr Urine: 175
Epinephrine, 24 hr Urine: 3.5
Norepinephrine, 24 hr Ur: 45
Total Volume - CF 24Hr U: 2500

## 2012-07-03 LAB — 5 HIAA, QUANTITATIVE, URINE, 24 HOUR: 5-HIAA, 24 Hr Urine: 7 mg/24 h — ABNORMAL HIGH (ref <=6.0)

## 2012-07-03 LAB — METANEPHRINES, URINE, 24 HOUR
Metaneph Total, Ur: 396 mcg/24 h (ref 224–832)
Metanephrines, Ur: 138 mcg/24 h (ref 90–315)
Normetanephrine, 24H Ur: 258 mcg/24 h (ref 122–676)

## 2012-07-10 ENCOUNTER — Encounter: Payer: Self-pay | Admitting: Nurse Practitioner

## 2012-07-10 ENCOUNTER — Ambulatory Visit (INDEPENDENT_AMBULATORY_CARE_PROVIDER_SITE_OTHER): Payer: Federal, State, Local not specified - Other | Admitting: Psychiatry

## 2012-07-10 ENCOUNTER — Ambulatory Visit (INDEPENDENT_AMBULATORY_CARE_PROVIDER_SITE_OTHER): Payer: Medicare Other | Admitting: Nurse Practitioner

## 2012-07-10 VITALS — BP 132/84 | HR 70 | Ht 64.0 in | Wt 166.8 lb

## 2012-07-10 DIAGNOSIS — F4323 Adjustment disorder with mixed anxiety and depressed mood: Secondary | ICD-10-CM

## 2012-07-10 DIAGNOSIS — I10 Essential (primary) hypertension: Secondary | ICD-10-CM

## 2012-07-10 NOTE — Patient Instructions (Addendum)
Stay on your current medicines  Dr. Daleen Squibb will see you in 3 months.  I will talk with him about your urine tests and we will be in touch tomorrow.  Call the [facility] office at [phone] if you have any questions, problems or concerns.

## 2012-07-10 NOTE — Progress Notes (Addendum)
[name] Date of Birth: 09/25/1950 Medical Record #[id number]  History of Present Illness: [name] is seen back today for a 3 week check. She is seen for Dr. Daleen Squibb. She has HTN. Intolerant to several medicines. Her other issues are as noted below.   I saw her 3 weeks ago. BP was up. She was worried about a pheo. We checked 24 hour urine. Tried to start Hydralazine. She did not tolerate. Advised to increase her ARB.   She comes back today. She is here alone. Overall, she say she is doing ok. Remains quite stressed with her mom. Blood pressure has improved. Readings are good at home. She did not increase her ARB. Saw her primary doctor who gave her Norvasc. I chose not to use this due to her allergy to Verapamil. So far she is doing ok on it. Her 24 hour urine was negative for catecholamines and metanephrine's. Her 5HIAA was just ever so slightly elevated and could have been affected by diet. I would like to review this with Dr. Daleen Squibb upon his return tomorrow. She has basically been ok. She did have a sharp chest pain in her right breast while driving yesterday and felt some soreness today. This has been fleeting. She has had breast cancer on the right and has had radiation. She has been wearing new bras and this seems more musculoskeletal. She has no exertional symptoms.   Current Outpatient Prescriptions on File Prior to Visit  Medication Sig Dispense Refill  . acetaminophen (TYLENOL) 500 MG tablet Take 500 mg by mouth every 6 (six) hours as needed. pain      . albuterol (PROVENTIL HFA;VENTOLIN HFA) 108 (90 BASE) MCG/ACT inhaler Inhale 2 puffs into the lungs every 6 (six) hours as needed. For shortness of breath      . ALPRAZolam (XANAX) 1 MG tablet Take 1 mg by mouth 3 (three) times daily as needed. 1/2 to 1 tablet 3 times daily as needed anxiety      . amLODipine (NORVASC) 5 MG tablet Take 5 mg by mouth daily.      . Ascorbic Acid (VITAMIN C) 500 MG tablet Take 500 mg by mouth daily.         Marland Kitchen aspirin 81 MG tablet Take 81 mg by mouth daily.        . beclomethasone (QVAR) 40 MCG/ACT inhaler Inhale 2 puffs into the lungs 2 (two) times daily.      . bisoprolol (ZEBETA) 10 MG tablet TAKE 1 TABLET TWICE DAILY.  60 tablet  11  . Calcium Carbonate-Vitamin D (CALCIUM-CARB 600 + D) 600-125 MG-UNIT TABS Take 1 capsule by mouth daily.        . cetirizine (ZYRTEC ALLERGY) 10 MG tablet Take 10 mg by mouth daily.      . cycloSPORINE (RESTASIS) 0.05 % ophthalmic emulsion Place 1 drop into both eyes 2 (two) times daily.        . furosemide (LASIX) 20 MG tablet Take 20 mg by mouth daily.      . Multiple Vitamin (MULTIVITAMIN WITH MINERALS) TABS Take 1 tablet by mouth daily.      . nitroGLYCERIN (NITROSTAT) 0.4 MG SL tablet Place 1 tablet (0.4 mg total) under the tongue every 5 (five) minutes as needed for chest pain.  90 tablet  3  . OVER THE COUNTER MEDICATION Take 1 capsule by mouth daily. [facility] probiotic digestive care: benificail bacteria, with b, infantis 10mg       . pravastatin (  PRAVACHOL) 40 MG tablet Take 40 mg by mouth daily.        . vitamin E (VITAMIN E) 400 UNIT capsule Take 400 Units by mouth daily.        Allergies  Allergen Reactions  . Amoxicillin Swelling    Throat Swells  . Azithromycin Swelling    Throat Swelling  . Bromfed Swelling    Throat Swelling  . Cephalexin Swelling    Throat Swelling  . Clidinium-Chlordiazepoxide Swelling    Throat Swelling  . Clotrimazole Other (See Comments) and Hypertension    Patient told me that she couldn't swallow due to the medication  . Dicyclomine Hcl Hives  . Hydralazine Hcl     Rash and itching  . Ibuprofen     N/V  . Iohexol      Code: HIVES, Desc: throat swelling no hives 20 yrs ago;needs pre-medication  09/19/07 sg, Onset Date: [id number]   . Latex Swelling    Blisters on Skin  . Lidocaine Hives  . Paroxetine Swelling    Throat Swelling  . Penicillins Hives  . Propoxyphene-Acetaminophen Swelling    REACTION:  swelling in the throat  . Sertraline Hcl Swelling    Throat Swelling  . Sulfadiazine Swelling    Throat Swelling  . Verapamil Swelling    Throat Swelling    Past Medical History  Diagnosis Date  . Myocardial infarct 2005  . CAD (coronary artery disease)   . Hypertension   . Long term (current) use of anticoagulants   . Edema   . Hematoma   . Sinusitis acute   . Adenocarcinoma of breast     right  . Acute upper respiratory infections of unspecified site   . Depression   . Anal fissure   . GERD (gastroesophageal reflux disease)   . Dog bite(E906.0)   . Diverticulosis of colon (without mention of hemorrhage)   . Spinal stenosis, lumbar region, without neurogenic claudication   . Anxiety   . Panic attacks   . Asthma   . Irritable bowel syndrome   . Hiatal hernia   . Jaundice     Hx of Jaundice at age 43 from "dirty restuarant". Unsure of Hepatitis type    Past Surgical History  Procedure Date  . Vesicovaginal fistula closure w/ tah 1987  . Cardiac catheterization 2001  . Coronary angioplasty with stent placement 2001  . Cholecystectomy   . Bladder repair     tact  . Partial hymenectomy   . Breast lumpectomy     right  . Breast reconstruction     right breast  . Breast reduction surgery     left breast  . Abdominal hysterectomy     partial  . Colonoscopy   . Cataract extraction     History  Smoking status  . Former Smoker -- 0.3 packs/day for 8 years  . Types: Cigarettes  . Quit date: 09/13/1983  Smokeless tobacco  . Never Used    History  Alcohol Use No    Family History  Problem Relation Age of Onset  . Hypertension Mother   . Pancreatic cancer Father   . Heart disease Mother   . Diabetes Father   . Dementia Mother   . Arthritis Mother   . Hypertension Father   . Hypertension Sister   . Hypertension Brother   . Heart disease Sister   . Diabetes Mother     Review of Systems: The review of systems is per the HPI.  All other systems were  reviewed and are negative.  Physical Exam: BP 132/84  Pulse 70  Ht 5\' 4"  (1.626 m)  Wt 166 lb 12.8 oz (75.66 kg)  BMI 28.63 kg/m2 Patient is very pleasant and in no acute distress. Skin is warm and dry. Color is normal.  HEENT is unremarkable. Normocephalic/atraumatic. PERRL. Sclera are nonicteric. Neck is supple. No masses. No JVD. Lungs are clear. Cardiac exam shows a regular rate and rhythm. Abdomen is soft. Extremities are without edema. Gait and ROM are intact. No gross neurologic deficits noted.  LABORATORY DATA: Lab Results  Component Value Date   WBC 5.4 11/18/2011   HGB 13.9 11/18/2011   HCT 41.4 11/18/2011   PLT 179 11/18/2011   GLUCOSE 94 06/19/2012   CHOL  Value: 137        ATP III CLASSIFICATION:  <200     mg/dL   Desirable  161-096  mg/dL   Borderline High  >=045    mg/dL   High        40/05/8118   TRIG 55 07/17/2010   HDL 51 07/17/2010   LDLCALC  Value: 75        Total Cholesterol/HDL:CHD Risk Coronary Heart Disease Risk Table                     Men   Women  1/2 Average Risk   3.4   3.3  Average Risk       5.0   4.4  2 X Average Risk   9.6   7.1  3 X Average Risk  23.4   11.0        Use the calculated Patient Ratio above and the CHD Risk Table to determine the patient's CHD Risk.        ATP III CLASSIFICATION (LDL):  <100     mg/dL   Optimal  147-829  mg/dL   Near or Above                    Optimal  130-159  mg/dL   Borderline  562-130  mg/dL   High  >865     mg/dL   Very High 78/12/6960   ALT 12 11/18/2011   AST 14 11/18/2011   NA 140 06/19/2012   K 3.4* 06/19/2012   CL 104 06/19/2012   CREATININE 0.8 06/19/2012   BUN 17 06/19/2012   CO2 31 06/19/2012   TSH 0.831 07/16/2010   INR 1.11 07/16/2010   HGBA1C 6.2 06/10/2011     Assessment / Plan: HTN - her blood pressure has improved. She is monitoring at home. Her cuff correlates nicely. She is tolerating this current regimen. We will see her back in 3 months. I will talk with Dr. Daleen Squibb regarding her 24 hour urine.   Patient is agreeable to  this plan and will call if any problems develop in the interim.   Addendum: I have reviewed her 24 hour urine with Dr. Daleen Squibb. He would like to repeat the 5HIAA with the dietary restrictions to just make sure she is ok.

## 2012-07-11 ENCOUNTER — Other Ambulatory Visit: Payer: Self-pay | Admitting: Nurse Practitioner

## 2012-07-11 ENCOUNTER — Telehealth: Payer: Self-pay | Admitting: *Deleted

## 2012-07-11 DIAGNOSIS — I10 Essential (primary) hypertension: Secondary | ICD-10-CM

## 2012-07-11 NOTE — Telephone Encounter (Signed)
Message copied by Debbe Bales on Wed Jul 11, 2012 12:23 PM ------      Message from: Rosalio Macadamia      Created: Wed Jul 11, 2012 10:39 AM       Can you call [name]. Tell her that Dr. Daleen Squibb has reviewed her urine tests. He feels like it is ok but does want to repeat it to just make sure. We will need to send her the instructions on the preparation.             lori

## 2012-07-11 NOTE — Telephone Encounter (Signed)
t/w pt will come in tomorrow to pick up 24 hour container pt aware of  instructions

## 2012-07-16 ENCOUNTER — Other Ambulatory Visit: Payer: Self-pay

## 2012-07-16 ENCOUNTER — Other Ambulatory Visit: Payer: Medicare Other

## 2012-07-16 DIAGNOSIS — J3489 Other specified disorders of nose and nasal sinuses: Secondary | ICD-10-CM | POA: Diagnosis not present

## 2012-07-16 DIAGNOSIS — I10 Essential (primary) hypertension: Secondary | ICD-10-CM

## 2012-07-16 DIAGNOSIS — Z23 Encounter for immunization: Secondary | ICD-10-CM | POA: Diagnosis not present

## 2012-07-16 DIAGNOSIS — F411 Generalized anxiety disorder: Secondary | ICD-10-CM | POA: Diagnosis not present

## 2012-07-24 ENCOUNTER — Ambulatory Visit (INDEPENDENT_AMBULATORY_CARE_PROVIDER_SITE_OTHER): Payer: Medicare Other | Admitting: Psychiatry

## 2012-07-24 DIAGNOSIS — F4323 Adjustment disorder with mixed anxiety and depressed mood: Secondary | ICD-10-CM | POA: Diagnosis not present

## 2012-07-25 ENCOUNTER — Other Ambulatory Visit: Payer: Self-pay | Admitting: *Deleted

## 2012-07-25 MED ORDER — FUROSEMIDE 20 MG PO TABS: 20.0000 mg | ORAL_TABLET | Freq: Every day | ORAL | Status: DC

## 2012-08-07 ENCOUNTER — Ambulatory Visit (INDEPENDENT_AMBULATORY_CARE_PROVIDER_SITE_OTHER): Payer: Medicare Other | Admitting: Psychiatry

## 2012-08-07 DIAGNOSIS — F4323 Adjustment disorder with mixed anxiety and depressed mood: Secondary | ICD-10-CM

## 2012-08-22 ENCOUNTER — Ambulatory Visit (INDEPENDENT_AMBULATORY_CARE_PROVIDER_SITE_OTHER): Payer: Medicare Other | Admitting: Psychiatry

## 2012-08-22 DIAGNOSIS — F4323 Adjustment disorder with mixed anxiety and depressed mood: Secondary | ICD-10-CM | POA: Diagnosis not present

## 2012-08-28 ENCOUNTER — Other Ambulatory Visit (INDEPENDENT_AMBULATORY_CARE_PROVIDER_SITE_OTHER): Payer: Medicare Other

## 2012-08-28 ENCOUNTER — Ambulatory Visit (INDEPENDENT_AMBULATORY_CARE_PROVIDER_SITE_OTHER): Payer: Medicare Other | Admitting: Gastroenterology

## 2012-08-28 ENCOUNTER — Ambulatory Visit (HOSPITAL_COMMUNITY)
Admission: RE | Admit: 2012-08-28 | Discharge: 2012-08-28 | Disposition: A | Discharge status: Home / Self Care | Payer: Medicare Other | Source: Ambulatory Visit | Attending: Gastroenterology | Admitting: Gastroenterology

## 2012-08-28 ENCOUNTER — Encounter: Payer: Self-pay | Admitting: Gastroenterology

## 2012-08-28 VITALS — BP 124/84 | HR 82 | Ht 64.0 in | Wt 169.4 lb

## 2012-08-28 DIAGNOSIS — C50919 Malignant neoplasm of unspecified site of unspecified female breast: Secondary | ICD-10-CM | POA: Insufficient documentation

## 2012-08-28 DIAGNOSIS — R142 Eructation: Secondary | ICD-10-CM

## 2012-08-28 DIAGNOSIS — K219 Gastro-esophageal reflux disease without esophagitis: Secondary | ICD-10-CM

## 2012-08-28 DIAGNOSIS — R1013 Epigastric pain: Secondary | ICD-10-CM

## 2012-08-28 DIAGNOSIS — F458 Other somatoform disorders: Secondary | ICD-10-CM

## 2012-08-28 DIAGNOSIS — Z9089 Acquired absence of other organs: Secondary | ICD-10-CM

## 2012-08-28 DIAGNOSIS — R141 Gas pain: Secondary | ICD-10-CM

## 2012-08-28 DIAGNOSIS — R140 Abdominal distension (gaseous): Secondary | ICD-10-CM

## 2012-08-28 DIAGNOSIS — Z853 Personal history of malignant neoplasm of breast: Secondary | ICD-10-CM | POA: Diagnosis not present

## 2012-08-28 DIAGNOSIS — I10 Essential (primary) hypertension: Secondary | ICD-10-CM | POA: Diagnosis not present

## 2012-08-28 DIAGNOSIS — K589 Irritable bowel syndrome without diarrhea: Secondary | ICD-10-CM

## 2012-08-28 DIAGNOSIS — Z9049 Acquired absence of other specified parts of digestive tract: Secondary | ICD-10-CM

## 2012-08-28 LAB — HEPATIC FUNCTION PANEL
ALT: 18 U/L (ref 0–35)
AST: 23 U/L (ref 0–37)
Albumin: 4.3 g/dL (ref 3.5–5.2)
Alkaline Phosphatase: 60 U/L (ref 39–117)
Bilirubin, Direct: 0.1 mg/dL (ref 0.0–0.3)
Total Bilirubin: 1 mg/dL (ref 0.3–1.2)
Total Protein: 7.3 g/dL (ref 6.0–8.3)

## 2012-08-28 LAB — IBC PANEL
Iron: 85 ug/dL (ref 42–145)
Saturation Ratios: 25.4 % (ref 20.0–50.0)
Transferrin: 239 mg/dL (ref 212.0–360.0)

## 2012-08-28 LAB — LIPASE: Lipase: 35 U/L (ref 11.0–59.0)

## 2012-08-28 LAB — VITAMIN B12: Vitamin B-12: 337 pg/mL (ref 211–911)

## 2012-08-28 LAB — FERRITIN: Ferritin: 74.6 ng/mL (ref 10.0–291.0)

## 2012-08-28 LAB — FOLATE: Folate: >24.8 ng/mL

## 2012-08-28 MED ORDER — TRAMADOL HCL 50 MG PO TABS: 50.0000 mg | ORAL_TABLET | Freq: Four times a day (QID) | ORAL | Status: DC | PRN

## 2012-08-28 NOTE — Progress Notes (Signed)
This is a 61 year old Caucasian female with hypertensive cardiovascular disease and multiple drug allergies.  She also has a history of IBS, GERD, constipation, or other artery disease, chronic anxiety and depression with panic attacks, as had previous surgery for breast cancer.  Endoscopy a year and a half ago was fairly unremarkable.  She is status post cholecystectomy.  Currently the patient describes several months of" agonizing" pain or subxiphoid area without typical acid reflux or hepatobiliary complaints.  Her pain is associated with severe distention, bloating,, but she denies bowel or regularity, melena or medically Z.  Patient also denies abuse of alcohol, cigarettes, or NSAIDs.  She has asthma, allergies, and apparently uses when necessary nitroglycerin, but her chest pain is not associated with exertion or other cardiopulmonary complaints.  Current Medications, Allergies, Past Medical History, Past Surgical History, Family History and Social History were reviewed in [employer] record.  Pertinent Review of Systems Negative   Physical Exam: Healthy-appearing patient in no acute distress.  Blood pressure 124/84, pulse 82 and regular, and weight under and 69 pounds the BMI of 29.08.  Oxygen saturation on room air 97%.  Cannot appreciate stigmata of chronic liver disease.  Chest is clear and she is in a regular rhythm without murmurs gallops or rubs.  Her abdomen shows no significant distention, organomegaly, masses or significant tenderness.  Bowel sounds are nonobstructive.  Mental status is normal.  Peripheral extremities showed no edema or phlebitis.    Assessment and Plan: Recurrent severe subxiphoid pain in a 61 year old patient is status post cholecystectomy and is on regular daily PPI therapy.  I've scheduled repeat upper abdominal ultrasonography, endoscopy, and we may need to proceed with esophageal manometry.  She has a long history of anxiety and depression, and  this may be an upper GI- associated IBS symptoms.  I cannot see where she is on an antidepressant medication, but a SSRI OR triyclic antidepressant may be in order.  Labs also ordered for review.  I have increased her twice a day PPI with when necessary liquid Carafate, and when necessary tramadol pending her workup.  She has multiple drug allergies to all anti-spasmodic... Encounter Diagnoses  Name Primary?  . Epigastric pain Yes  . Other general symptoms   . Gastritis, acute    . Bloating

## 2012-08-28 NOTE — Patient Instructions (Addendum)
You have been scheduled for an endoscopy with propofol. Please follow written instructions given to you at your visit today. If you use inhalers (even only as needed) or a CPAP machine, please bring them with you on the day of your procedure.  You have been scheduled for an abdominal ultrasound at [facility] (1st floor of hospital) on 08-28-2012 at [facility]. Please arrive 15 minutes prior to your appointment for registration. Make certain not to have anything to eat or drink 6 hours prior to your appointment. Should you need to reschedule your appointment, please contact radiology at [phone]. This test typically takes about 30 minutes to perform.  Your physician has requested that you go to the basement for lab work before leaving today.  We have sent the following medications to your pharmacy for you to pick up at your convenience:Tramadol, please take as directed.

## 2012-08-29 ENCOUNTER — Ambulatory Visit (AMBULATORY_SURGERY_CENTER): Payer: Medicare Other | Admitting: Gastroenterology

## 2012-08-29 ENCOUNTER — Encounter: Payer: Self-pay | Admitting: Gastroenterology

## 2012-08-29 VITALS — BP 152/78 | HR 65 | Temp 97.5°F | Resp 15 | Ht 64.0 in | Wt 169.0 lb

## 2012-08-29 DIAGNOSIS — R1013 Epigastric pain: Secondary | ICD-10-CM | POA: Diagnosis not present

## 2012-08-29 DIAGNOSIS — F410 Panic disorder [episodic paroxysmal anxiety] without agoraphobia: Secondary | ICD-10-CM | POA: Diagnosis not present

## 2012-08-29 DIAGNOSIS — R1310 Dysphagia, unspecified: Secondary | ICD-10-CM | POA: Diagnosis not present

## 2012-08-29 DIAGNOSIS — K219 Gastro-esophageal reflux disease without esophagitis: Secondary | ICD-10-CM

## 2012-08-29 DIAGNOSIS — K449 Diaphragmatic hernia without obstruction or gangrene: Secondary | ICD-10-CM | POA: Diagnosis not present

## 2012-08-29 DIAGNOSIS — K222 Esophageal obstruction: Secondary | ICD-10-CM

## 2012-08-29 DIAGNOSIS — F319 Bipolar disorder, unspecified: Secondary | ICD-10-CM | POA: Diagnosis not present

## 2012-08-29 MED ORDER — SODIUM CHLORIDE 0.9 % IV SOLN: 500.0000 mL | INTRAVENOUS | Status: DC

## 2012-08-29 NOTE — Patient Instructions (Addendum)
YOU HAD AN ENDOSCOPIC PROCEDURE TODAY AT THE [facility]: Refer to the procedure report that was given to you for any specific questions about what was found during the examination.  If the procedure report does not answer your questions, please call your gastroenterologist to clarify.  If you requested that your care partner not be given the details of your procedure findings, then the procedure report has been included in a sealed envelope for you to review at your convenience later.  YOU SHOULD EXPECT: Some feelings of bloating in the abdomen. Passage of more gas than usual.  Walking can help get rid of the air that was put into your GI tract during the procedure and reduce the bloating. If you had a lower endoscopy (such as a colonoscopy or flexible sigmoidoscopy) you may notice spotting of blood in your stool or on the toilet paper. If you underwent a bowel prep for your procedure, then you may not have a normal bowel movement for a few days.  DIET: Your first meal following the procedure should be a light meal and then it is ok to progress to your normal diet.  A half-sandwich or bowl of soup is an example of a good first meal.  Heavy or fried foods are harder to digest and may make you feel nauseous or bloated.  Likewise meals heavy in dairy and vegetables can cause extra gas to form and this can also increase the bloating.  Drink plenty of fluids but you should avoid alcoholic beverages for 24 hours.  ACTIVITY: Your care partner should take you home directly after the procedure.  You should plan to take it easy, moving slowly for the rest of the day.  You can resume normal activity the day after the procedure however you should NOT DRIVE or use heavy machinery for 24 hours (because of the sedation medicines used during the test).    SYMPTOMS TO REPORT IMMEDIATELY: A gastroenterologist can be reached at any hour.  During normal business hours, 8:30 AM to 5:00 PM Monday through Friday,  call [phone].  After hours and on weekends, please call the GI answering service at [phone] who will take a message and have the physician on call contact you.    Following upper endoscopy (EGD)  Vomiting of blood or coffee ground material  New chest pain or pain under the shoulder blades  Painful or persistently difficult swallowing  New shortness of breath  Fever of 100F or higher  Black, tarry-looking stools  FOLLOW UP: If any biopsies were taken you will be contacted by phone or by letter within the next 1-3 weeks.  Call your gastroenterologist if you have not heard about the biopsies in 3 weeks.  Our staff will call the home number listed on your records the next business day following your procedure to check on you and address any questions or concerns that you may have at that time regarding the information given to you following your procedure. This is a courtesy call and so if there is no answer at the home number and we have not heard from you through the emergency physician on call, we will assume that you have returned to your regular daily activities without incident.  SIGNATURES/CONFIDENTIALITY: You and/or your care partner have signed paperwork which will be entered into your electronic medical record.  These signatures attest to the fact that that the information above on your After Visit Summary has been reviewed and is understood.  Full   responsibility of the confidentiality of this discharge information lies with you and/or your care-partner.    Follow post dilation diet for rest of day. GERD and stricture information given.  Dr. Norval Gable office will arrange future esophageal manometry to test muscle function.

## 2012-08-29 NOTE — Progress Notes (Signed)
Patient did not experience any of the following events: a burn prior to discharge; a fall within the facility; wrong site/side/patient/procedure/implant event; or a hospital transfer or hospital admission upon discharge from the facility. (G8907) Patient did not have preoperative order for IV antibiotic SSI prophylaxis. (G8918)  

## 2012-08-29 NOTE — Progress Notes (Signed)
Propofol given over incremental dosages 

## 2012-08-29 NOTE — Op Note (Signed)
[facility] [address] [place], [address]   ENDOSCOPY PROCEDURE REPORT  PATIENT: Linda, [name]  MR#: [id number] BIRTHDATE: 10/18/1950 , 61  yrs. old GENDER: Female ENDOSCOPIST:David Hale Bogus, MD, [facility] REFERRED BY: PROCEDURE DATE:  08/29/2012 PROCEDURE:   EGD, diagnostic and Maloney dilation of esophagus ASA CLASS:    Class II INDICATIONS: Epigastric pain and Dysphagia. MEDICATION: propofol (Diprivan) 100mg  IV TOPICAL ANESTHETIC:  DESCRIPTION OF PROCEDURE:   After the risks and benefits of the procedure were explained, informed consent was obtained.  The LB [id number]  endoscope was introduced through the mouth  and advanced to the second portion of the duodenum .  The instrument was slowly withdrawn as the mucosa was fully examined.    The upper, middle and distal third of the esophagus were carefully inspected and no abnormalities were noted.  The z-line was well seen at the GEJ.  The endoscope was pushed into the fundus which was normal including a retroflexed view.  The antrum, gastric body, first and second part of the duodenum were unremarkable. Retroflexed views revealed no abnormalities.  Dilated #73F maloney dilator..slight heme...l  The scope was then withdrawn from the patient and the procedure completed.  COMPLICATIONS: There were no complications.   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  Continue PPI 2.  My office will arrange for you to have an esophageal manometry test performed.  This is an test of you muscle function of your esophagus.    _______________________________ eSignedMardella Layman, MD, [facility] 08/29/2012 4:19 PM   standard discharge

## 2012-08-30 ENCOUNTER — Telehealth: Payer: Self-pay

## 2012-08-30 ENCOUNTER — Ambulatory Visit (INDEPENDENT_AMBULATORY_CARE_PROVIDER_SITE_OTHER): Payer: Medicare Other | Admitting: Psychiatry

## 2012-08-30 DIAGNOSIS — F4323 Adjustment disorder with mixed anxiety and depressed mood: Secondary | ICD-10-CM | POA: Diagnosis not present

## 2012-08-30 NOTE — Telephone Encounter (Signed)
  Follow up Call-  Call back number 08/29/2012 05/11/2012 05/23/2011  Post procedure Call Back phone  # [phone]  Permission to leave phone message Yes Yes -     Patient questions:  Do you have a fever, pain , or abdominal swelling? no Pain Score  0 *  Have you tolerated food without any problems? yes  Have you been able to return to your normal activities? yes  Do you have any questions about your discharge instructions: Diet   no Medications  no Follow up visit  no  Do you have questions or concerns about your Care? no  Actions: * If pain score is 4 or above: No action needed, pain <4.

## 2012-09-12 HISTORY — PX: ESOPHAGOGASTRODUODENOSCOPY: SHX1529

## 2012-09-18 ENCOUNTER — Telehealth: Payer: Self-pay | Admitting: *Deleted

## 2012-09-18 NOTE — Telephone Encounter (Signed)
lmom for pt to call back. Per EGD, Dr Jarold Motto wrote for pt to have an Esophageal Manometry.

## 2012-09-19 ENCOUNTER — Ambulatory Visit (INDEPENDENT_AMBULATORY_CARE_PROVIDER_SITE_OTHER): Payer: Medicare Other | Admitting: Psychiatry

## 2012-09-19 ENCOUNTER — Other Ambulatory Visit: Payer: Self-pay | Admitting: *Deleted

## 2012-09-19 DIAGNOSIS — F4323 Adjustment disorder with mixed anxiety and depressed mood: Secondary | ICD-10-CM | POA: Diagnosis not present

## 2012-09-19 DIAGNOSIS — R1013 Epigastric pain: Secondary | ICD-10-CM

## 2012-09-19 DIAGNOSIS — R1310 Dysphagia, unspecified: Secondary | ICD-10-CM

## 2012-09-19 NOTE — Telephone Encounter (Signed)
Pt called back and I scheduled her EM for 10/08/12 at 0800. Mailed her instructions to her.

## 2012-09-24 ENCOUNTER — Telehealth: Payer: Self-pay | Admitting: Cardiology

## 2012-09-24 DIAGNOSIS — Z961 Presence of intraocular lens: Secondary | ICD-10-CM | POA: Diagnosis not present

## 2012-09-24 NOTE — Telephone Encounter (Signed)
Spoke with [name] [phone].Prior Authorization for diovan waiting for further review,will hear within 2 business days.Patient was called and told.

## 2012-09-24 NOTE — Telephone Encounter (Signed)
Statement of medical necessity is needed so they can pay for pt medication called Diovan and  they need to hear from [place] today

## 2012-10-03 ENCOUNTER — Ambulatory Visit: Payer: Medicare Other | Admitting: Cardiology

## 2012-10-04 ENCOUNTER — Ambulatory Visit (INDEPENDENT_AMBULATORY_CARE_PROVIDER_SITE_OTHER): Payer: Medicare Other | Admitting: Psychiatry

## 2012-10-04 DIAGNOSIS — F4323 Adjustment disorder with mixed anxiety and depressed mood: Secondary | ICD-10-CM | POA: Diagnosis not present

## 2012-10-08 ENCOUNTER — Ambulatory Visit (HOSPITAL_COMMUNITY)
Admission: RE | Admit: 2012-10-08 | Discharge: 2012-10-08 | Disposition: A | Discharge status: Home / Self Care | Payer: Medicare Other | Source: Ambulatory Visit | Attending: Gastroenterology | Admitting: Gastroenterology

## 2012-10-08 ENCOUNTER — Encounter (HOSPITAL_COMMUNITY)
Admission: RE | Disposition: A | Discharge status: Home / Self Care | Payer: Self-pay | Source: Ambulatory Visit | Attending: Gastroenterology

## 2012-10-08 DIAGNOSIS — K219 Gastro-esophageal reflux disease without esophagitis: Secondary | ICD-10-CM

## 2012-10-08 DIAGNOSIS — R1310 Dysphagia, unspecified: Secondary | ICD-10-CM | POA: Insufficient documentation

## 2012-10-08 DIAGNOSIS — R1013 Epigastric pain: Secondary | ICD-10-CM | POA: Diagnosis not present

## 2012-10-08 HISTORY — PX: ESOPHAGEAL MANOMETRY: SHX5429

## 2012-10-08 SURGERY — MANOMETRY, ESOPHAGUS

## 2012-10-09 ENCOUNTER — Encounter (HOSPITAL_COMMUNITY): Payer: Self-pay | Admitting: Gastroenterology

## 2012-10-10 ENCOUNTER — Ambulatory Visit: Payer: Self-pay | Admitting: Cardiology

## 2012-10-13 ENCOUNTER — Telehealth: Payer: Self-pay | Admitting: *Deleted

## 2012-10-13 NOTE — Telephone Encounter (Signed)
Left message for pt to return our call so we can reschedule her med onc appt.

## 2012-10-18 ENCOUNTER — Ambulatory Visit (INDEPENDENT_AMBULATORY_CARE_PROVIDER_SITE_OTHER): Payer: Medicare Other | Admitting: Psychiatry

## 2012-10-18 DIAGNOSIS — F4323 Adjustment disorder with mixed anxiety and depressed mood: Secondary | ICD-10-CM | POA: Diagnosis not present

## 2012-10-19 ENCOUNTER — Telehealth: Payer: Self-pay | Admitting: *Deleted

## 2012-10-19 ENCOUNTER — Encounter: Payer: Self-pay | Admitting: Oncology

## 2012-10-19 DIAGNOSIS — J019 Acute sinusitis, unspecified: Secondary | ICD-10-CM | POA: Diagnosis not present

## 2012-10-19 NOTE — Telephone Encounter (Signed)
Pt request to have f/u with Dr. Welton Flakes.  New appt date and time of 11/06/12 at 2:15 with Warner Mccreedy, NP given to pt.

## 2012-10-26 ENCOUNTER — Telehealth: Payer: Self-pay | Admitting: *Deleted

## 2012-10-26 NOTE — Telephone Encounter (Signed)
Called and spoke with patient to reschedule her appt. Due to changes made in Melissa's schedule. Confirmed appt. For 11/26/12 at 215pm.

## 2012-10-29 ENCOUNTER — Ambulatory Visit (INDEPENDENT_AMBULATORY_CARE_PROVIDER_SITE_OTHER): Payer: Medicare Other | Admitting: General Surgery

## 2012-10-29 ENCOUNTER — Telehealth: Payer: Self-pay | Admitting: Gastroenterology

## 2012-10-29 DIAGNOSIS — J45909 Unspecified asthma, uncomplicated: Secondary | ICD-10-CM | POA: Diagnosis not present

## 2012-10-29 NOTE — Telephone Encounter (Signed)
I spoke with [name] at [facility] pharmacy services to start prior authorization.  Per Lacie I need to call [phone] to get automated service to fax over prior authorization.  ____________________________________________________________________________________________________________________  Patient was notified that fax was filled out and faxed back. I advised patient that normally it takes 24-48 hours to hear back about prior authorization.  I advised patient that once I hear back from [facility] about prior authorization I will notify her.  Patient has enough medication.  Patient verbalized understanding.

## 2012-11-01 ENCOUNTER — Ambulatory Visit: Payer: Medicare Other | Admitting: Internal Medicine

## 2012-11-06 ENCOUNTER — Ambulatory Visit: Payer: Medicare Other | Admitting: Gynecologic Oncology

## 2012-11-08 ENCOUNTER — Ambulatory Visit: Payer: Medicare Other | Admitting: Psychiatry

## 2012-11-09 ENCOUNTER — Telehealth: Payer: Self-pay | Admitting: Gastroenterology

## 2012-11-09 NOTE — Telephone Encounter (Signed)
Pt reports she is having a problem with her stomach swelling; the swelling is worse at night. She states certain foods make it worse and when asked she stated tomato based products and others. She is not on any new meds that could cause this. She also states she never heard back from her last test, EM but I found results and gave then to her and Dr Jarold Motto wants to see her to discuss. Pt is scheduled for 11/27/12.

## 2012-11-13 ENCOUNTER — Other Ambulatory Visit: Payer: Self-pay | Admitting: Lab

## 2012-11-20 ENCOUNTER — Telehealth: Payer: Self-pay | Admitting: *Deleted

## 2012-11-20 ENCOUNTER — Ambulatory Visit: Payer: Medicare Other | Admitting: Oncology

## 2012-11-20 MED ORDER — METOPROLOL TARTRATE 50 MG PO TABS: 50.0000 mg | ORAL_TABLET | Freq: Two times a day (BID) | ORAL | Status: DC

## 2012-11-20 NOTE — Telephone Encounter (Signed)
Left detailed message regarding Bisoprol not being covered by her insurance formulary. Dr. Daleen Squibb has reviewed and recommended changing medication to:  Metoprolol Tartrate 50mg  1 tablet twice a day  I have sent in prescription for this and asked that pt call back. Mylo Red RN

## 2012-11-21 DIAGNOSIS — E785 Hyperlipidemia, unspecified: Secondary | ICD-10-CM | POA: Diagnosis not present

## 2012-11-21 DIAGNOSIS — R609 Edema, unspecified: Secondary | ICD-10-CM | POA: Diagnosis not present

## 2012-11-21 DIAGNOSIS — I10 Essential (primary) hypertension: Secondary | ICD-10-CM | POA: Diagnosis not present

## 2012-11-21 DIAGNOSIS — F411 Generalized anxiety disorder: Secondary | ICD-10-CM | POA: Diagnosis not present

## 2012-11-21 NOTE — Telephone Encounter (Signed)
Pt is aware of medication change due to her insurance. She will start Metoprolol today as she is currently out of the Bisoprol Mylo Red RN

## 2012-11-26 ENCOUNTER — Ambulatory Visit: Payer: Medicare Other | Admitting: Gynecologic Oncology

## 2012-11-26 ENCOUNTER — Encounter (INDEPENDENT_AMBULATORY_CARE_PROVIDER_SITE_OTHER): Payer: Self-pay | Admitting: General Surgery

## 2012-11-26 ENCOUNTER — Ambulatory Visit (INDEPENDENT_AMBULATORY_CARE_PROVIDER_SITE_OTHER): Payer: Medicare Other | Admitting: General Surgery

## 2012-11-26 ENCOUNTER — Telehealth: Payer: Self-pay | Admitting: *Deleted

## 2012-11-26 VITALS — BP 130/68 | HR 84 | Temp 97.1°F | Resp 20 | Ht 64.0 in | Wt 179.0 lb

## 2012-11-26 DIAGNOSIS — Z853 Personal history of malignant neoplasm of breast: Secondary | ICD-10-CM | POA: Diagnosis not present

## 2012-11-26 NOTE — Telephone Encounter (Signed)
Pt called stating that she cannot drive in ice and I confirmed rescheduled 12/06/12 appt w/ pt.

## 2012-11-26 NOTE — Progress Notes (Signed)
Patient ID: [name], female   DOB: 1951/07/19, 62 y.o.   MRN: [id number] History: This patient returns for long-term followup of her right breast cancer. She was diagnosed with a large cancer in the 12:00 position of the right breast. She underwent neoadjuvant chemotherapy. On 05/10/2008 she underwent right partial mastectomy, sentinel lymph node biopsy. Final pathology was ypT2, ypN0. On 01/22/2011 she underwent bilateral reductions and has done well with that. Last mammogram 01/30/2012 was category 2. She's going to get mammograms again in May of this year. She has a history of coronary disease, myocardial infarction and hypertension is followed [facility]. She is also followed by a Samella Parr  at [facility]. Recent swelling lead to increasing her Lasix dose. She was Dr. Renelda Loma patient. She states that she's going to establish withDr. Drue Second. No complaints about her breast  Exam: Patient looks well. No distress Neck no adenopathy or mass Lungs clear to auscultation bilaterally Heart regular rate and rhythm her were no ectopy   breast--- long transverse curvilinear incision superior right breast. Bilateral reduction scars. Breasts are soft. No palpable mass no axillary adenopathy. Some radiation changes on the right  Assessment: Locally advanced cancer right breast, 12:00 position. Status post neoadjuvant chemotherapy, breast conservation surgery, ultimate bilateral reduction mammoplasty. No evidence of recurrence at this time  Plan: She will establish herself with Dr. Welton Flakes for medical oncology Mammograms in May 2014 Return to see me one year   Christon Parada M. Derrell Lolling, M.D., [facility] General and Minimally invasive Surgery Breast and Colorectal Surgery Office:   [phone] Pager:   [phone]

## 2012-11-26 NOTE — Patient Instructions (Signed)
Your breast exam today reveals no evidence of cancer. All of your scars have healed well. There is a little bit of pigmentation on the right from the radiation therapy. All of your lymph nodes feel normal.  Be sure to get bilateral mammograms in May 2014  Keep your appointment at the [facility] cancer center today  Return to see Dr. Derrell Lolling in one year.

## 2012-11-27 ENCOUNTER — Telehealth: Payer: Self-pay | Admitting: Gastroenterology

## 2012-11-27 ENCOUNTER — Ambulatory Visit: Payer: Medicare Other | Admitting: Gastroenterology

## 2012-12-06 ENCOUNTER — Telehealth: Payer: Self-pay | Admitting: Oncology

## 2012-12-06 ENCOUNTER — Ambulatory Visit (HOSPITAL_BASED_OUTPATIENT_CLINIC_OR_DEPARTMENT_OTHER): Payer: Medicare Other | Admitting: Adult Health

## 2012-12-06 ENCOUNTER — Encounter: Payer: Self-pay | Admitting: Adult Health

## 2012-12-06 ENCOUNTER — Telehealth: Payer: Self-pay | Admitting: *Deleted

## 2012-12-06 VITALS — BP 138/87 | HR 86 | Temp 98.1°F | Resp 20 | Ht 64.0 in | Wt 179.8 lb

## 2012-12-06 DIAGNOSIS — Z853 Personal history of malignant neoplasm of breast: Secondary | ICD-10-CM | POA: Diagnosis not present

## 2012-12-06 NOTE — Progress Notes (Signed)
OFFICE PROGRESS NOTE  CC**  Provider Not In System No address on file  DIAGNOSIS: 62 y/o female with stage IIA right breast cancer ER negative, PR negative, HER-2/neu negative.  PRIOR THERAPY:  1.  Patient was found to have a right breast mass at a routine gynecologic breast exam in 08/2007. She had a diagnostic mammogram on 08/29/2007 that found to macrolobulated masses 1.5 x 1.2 x 1.8 cm and 1.2 x 1.4 x. 1.7 cm.  The medial mass biopsy showed DCIS with foci suspicious for stromal invasion, the lateral mass showed invasive ductal carcinoma ER 1%, PR 0%, HER-2/neu negative with a Ki-67 of 38%.  MRI on 09/07/2007 showed a bilobed mass in the right upper inner quadrant of 4.9 x 2.6 x 4.8 cm with no axillary adenopathy.  2. Patient underwent neoadjuvant treatment with four cycles of dose dense FEC followed by 4 cycles of dose dense Taxotere coupled with Xeloda 1000mg  PO BID daily for 8 weeks.  There was an effort to increase her Xeloda to 1500mg  however she was hospitalized for febrile neutropenia.  She had f/u MRI on 03/23/08 that showed response to neoadjuvant chemotherapy with the mass decreasing to 3.4 x 2.6 x 3 cm.    3. She had a partial mastectomy by Dr. Derrell Lolling on 05/06/08 and a 3.8 cm tumor was removed.  Sentinel lymph node was negative.  4. She underwent radiation therapy with Dr. Mitzi Hansen from 06/03/08 to 07/22/08.    5. 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  A PET/CT in January 2010 was negative for any malignancy or cancer, and a lumbar puncture with cytology was negative as well.  She is followed by Dr. Marca Ancona in the pain clinic for her back.   CURRENT THERAPY: Observation  INTERVAL HISTORY: [name] 62 y.o. female returns for follow up.  She is doing well.  She has not noticed any changes in her breasts, or any other difficulties.  She denies fevers, chills, nausea,  vomiting, pain, or any other concerns.  She does have some swelling and is taking Lasix as directed by her PCP and has an echocardiogram scheduled as well as f/u with a cardiologist.    MEDICAL HISTORY: Past Medical History  Diagnosis Date  . Myocardial infarct 2005  . CAD (coronary artery disease)   . Hypertension   . Long term (current) use of anticoagulants   . Edema   . Hematoma   . Sinusitis acute   . Adenocarcinoma of breast     right  . Acute upper respiratory infections of unspecified site   . Depression   . Anal fissure   . GERD (gastroesophageal reflux disease)   . Dog bite(E906.0)   . Diverticulosis of colon (without mention of hemorrhage)   . Spinal stenosis, lumbar region, without neurogenic claudication   . Anxiety   . Panic attacks   . Asthma   . Irritable bowel syndrome   . Hiatal hernia   . Jaundice     Hx of Jaundice at age 89 from "dirty restuarant". Unsure of Hepatitis type    ALLERGIES:  is allergic to amoxicillin; azithromycin; bromfed; cephalexin; clidinium-chlordiazepoxide; clotrimazole; dicyclomine hcl; hydralazine hcl; ibuprofen; iohexol; latex; lidocaine; paroxetine; penicillins; propoxyphene-acetaminophen; sertraline hcl; sulfadiazine; and verapamil.  MEDICATIONS:  Current Outpatient Prescriptions  Medication Sig Dispense Refill  . acetaminophen (TYLENOL) 500 MG tablet Take 500 mg by mouth every 6 (six)  hours as needed. pain      . ADVAIR DISKUS 100-50 MCG/DOSE AEPB       . albuterol (PROVENTIL HFA;VENTOLIN HFA) 108 (90 BASE) MCG/ACT inhaler Inhale 2 puffs into the lungs every 6 (six) hours as needed. For shortness of breath      . ALPRAZolam (XANAX) 1 MG tablet Take 1 mg by mouth 3 (three) times daily as needed. 1/2 to 1 tablet 3 times daily as needed anxiety      . amLODipine (NORVASC) 5 MG tablet Take 5 mg by mouth daily.      . Ascorbic Acid (VITAMIN C) 500 MG tablet Take 500 mg by mouth daily.        Marland Kitchen aspirin 81 MG tablet Take 81 mg by mouth  daily.        . beclomethasone (QVAR) 40 MCG/ACT inhaler Inhale 2 puffs into the lungs 2 (two) times daily.      . bisoprolol (ZEBETA) 10 MG tablet       . Calcium Carbonate-Vitamin D (CALCIUM-CARB 600 + D) 600-125 MG-UNIT TABS Take 1 capsule by mouth daily.        . cetirizine (ZYRTEC ALLERGY) 10 MG tablet Take 10 mg by mouth daily.      . cycloSPORINE (RESTASIS) 0.05 % ophthalmic emulsion Place 1 drop into both eyes 2 (two) times daily.        . furosemide (LASIX) 20 MG tablet Take 1 tablet (20 mg total) by mouth daily.  30 tablet  5  . hydrochlorothiazide (HYDRODIURIL) 25 MG tablet Take 25 mg by mouth daily.      . metoprolol (LOPRESSOR) 50 MG tablet Take 1 tablet (50 mg total) by mouth 2 (two) times daily.  180 tablet  3  . Multiple Vitamin (MULTIVITAMIN WITH MINERALS) TABS Take 1 tablet by mouth daily.      . nitroGLYCERIN (NITROSTAT) 0.4 MG SL tablet Place 1 tablet (0.4 mg total) under the tongue every 5 (five) minutes as needed for chest pain.  90 tablet  3  . OVER THE COUNTER MEDICATION Take 1 capsule by mouth daily. [facility] probiotic digestive care: benificail bacteria, with b, infantis 10mg       . pantoprazole (PROTONIX) 40 MG tablet Take 40 mg by mouth daily.      . pravastatin (PRAVACHOL) 40 MG tablet Take 40 mg by mouth daily.        . traMADol (ULTRAM) 50 MG tablet Take 1 tablet (50 mg total) by mouth every 6 (six) hours as needed for pain.  30 tablet  1  . valsartan (DIOVAN) 160 MG tablet Take 1 tablet (160 mg total) by mouth daily.      . vitamin E (VITAMIN E) 400 UNIT capsule Take 400 Units by mouth daily.       No current facility-administered medications for this visit.    SURGICAL HISTORY:  Past Surgical History  Procedure Laterality Date  . Vesicovaginal fistula closure w/ tah  1987  . Cardiac catheterization  2001  . Coronary angioplasty with stent placement  2001  . Cholecystectomy    . Bladder repair      tact  . Partial hymenectomy    . Breast lumpectomy       right  . Breast reconstruction      right breast  . Breast reduction surgery      left breast  . Abdominal hysterectomy      partial  . Colonoscopy    . Cataract extraction    .  Esophageal manometry  10/08/2012    Procedure: ESOPHAGEAL MANOMETRY (EM);  Surgeon: Mardella Layman, MD;  Location: [facility];  Service: Endoscopy;  Laterality: N/A;    REVIEW OF SYSTEMS:  General: fatigue (-), night sweats (-), fever (-), pain (-) Lymph: palpable nodes (-) HEENT: vision changes (-), mucositis (-), gum bleeding (-), epistaxis (-) Cardiovascular: chest pain (-), palpitations (-) Pulmonary: shortness of breath (-), dyspnea on exertion (-), cough (-), hemoptysis (-) GI:  Early satiety (-), melena (-), dysphagia (-), nausea/vomiting (-), diarrhea (-) GU: dysuria (-), hematuria (-), incontinence (-) Musculoskeletal: joint swelling (-), joint pain (-), back pain (-) Neuro: weakness (-), numbness (-), headache (-), confusion (-) Skin: Rash (-), lesions (-), dryness (-) Psych: depression (-), suicidal/homicidal ideation (-), feeling of hopelessness (-)   HEALTH MAINTENANCE:  Mammogram 01/2012 Colonoscopy 04/2012 Bone Density n/a Pap Smear 05/2012 Eye Exam 09/2012 Vitamin D n/a Lipid Panel not checked recently  PHYSICAL EXAMINATION: Blood pressure 138/87, pulse 86, temperature 98.1 F (36.7 C), temperature source Oral, resp. rate 20, height 5\' 4"  (1.626 m), weight 179 lb 12.8 oz (81.557 kg). Body mass index is 30.85 kg/(m^2). General: Patient is a well appearing female in no acute distress HEENT: PERRLA, sclerae anicteric no conjunctival pallor, MMM Neck: supple, no palpable adenopathy Lungs: clear to auscultation bilaterally, no wheezes, rhonchi, or rales Cardiovascular: regular rate rhythm, S1, S2, no murmurs, rubs or gallops Abdomen: Soft, non-tender, non-distended, normoactive bowel sounds, no HSM Extremities: warm and well perfused, no clubbing, cyanosis, or edema Skin: No rashes  or lesions Neuro: Non-focal Breasts: Right breast lumpectomy site well healed, no nodularity or masses, left breast no nodularity, masses, or skin changes ECOG PERFORMANCE STATUS: 1 - Symptomatic but completely ambulatory      LABORATORY DATA: Lab Results  Component Value Date   WBC 4.3 12/07/2012   HGB 13.1 12/07/2012   HCT 39.2 12/07/2012   MCV 89.9 12/07/2012   PLT 188 12/07/2012      Chemistry      Component Value Date/Time   NA 145 12/07/2012 1341   NA 140 06/19/2012 1510   K 3.7 12/07/2012 1341   K 3.4* 06/19/2012 1510   CL 104 12/07/2012 1341   CL 104 06/19/2012 1510   CO2 32* 12/07/2012 1341   CO2 31 06/19/2012 1510   BUN 14.0 12/07/2012 1341   BUN 17 06/19/2012 1510   CREATININE 1.1 12/07/2012 1341   CREATININE 0.8 06/19/2012 1510      Component Value Date/Time   CALCIUM 10.0 12/07/2012 1341   CALCIUM 9.5 06/19/2012 1510   ALKPHOS 83 12/07/2012 1341   ALKPHOS 60 08/28/2012 1100   AST 22 12/07/2012 1341   AST 23 08/28/2012 1100   ALT 25 12/07/2012 1341   ALT 18 08/28/2012 1100   BILITOT 0.77 12/07/2012 1341   BILITOT 1.0 08/28/2012 1100       RADIOGRAPHIC STUDIES:  No results found.  ASSESSMENT:  [name] is a 62 y/o female  1. Stage IIA triple negative breast cancer of the right breast.   PLAN:  1.  We will see the patient back in every six months.  We updated her health maintenance information, discussed the importance of monthly self breast exams, yearly diagnostic mammograms, exercise, healthy diet, and calcium/vitamin D supplementation.     All questions were answered. The patient knows to call the clinic with any problems, questions or concerns. We can certainly see the patient much sooner if necessary.  I spent 40 minutes  counseling the patient face to face. The total time spent in the appointment was 60 minutes.  This case was reviewed by Dr. Welton Flakes.   Cherie Ouch Lyn Hollingshead, NP Medical Oncology [facility] Phone: [phone] 12/10/2012,  8:57 AM

## 2012-12-06 NOTE — Telephone Encounter (Signed)
gv pt appt schedule for March and October. Due to lb closed already pt will return for add on lb tomorrow.

## 2012-12-06 NOTE — Telephone Encounter (Signed)
Left message on pts cell and at home to let her know that Annice Pih is sick and cannot see her, however Dr. Welton Flakes can, but needs to see her at 1pm.  Requested for her to contact me asap and let me know if she is able to make the 1pm appt or if we need to reschedule.

## 2012-12-06 NOTE — Patient Instructions (Addendum)
Doing well.  We will see you in 6 months.   Proceed with mammogram due in May.  Please call us if you have any questions or concerns.

## 2012-12-07 ENCOUNTER — Other Ambulatory Visit (HOSPITAL_BASED_OUTPATIENT_CLINIC_OR_DEPARTMENT_OTHER): Payer: Medicare Other

## 2012-12-07 DIAGNOSIS — Z853 Personal history of malignant neoplasm of breast: Secondary | ICD-10-CM

## 2012-12-07 DIAGNOSIS — C50419 Malignant neoplasm of upper-outer quadrant of unspecified female breast: Secondary | ICD-10-CM | POA: Diagnosis not present

## 2012-12-07 DIAGNOSIS — R609 Edema, unspecified: Secondary | ICD-10-CM | POA: Diagnosis not present

## 2012-12-07 LAB — CANCER ANTIGEN 27.29: CA 27.29: 41 U/mL — ABNORMAL HIGH (ref 0–39)

## 2012-12-07 LAB — CBC WITH DIFFERENTIAL/PLATELET
Basophils Absolute: 0 10*3/uL (ref 0.0–0.1)
Eosinophils Absolute: 0.1 10*3/uL (ref 0.0–0.5)
LYMPH%: 27.9 % (ref 14.0–49.7)
MCH: 30 pg (ref 25.1–34.0)
MCV: 89.9 fL (ref 79.5–101.0)
MONO%: 6.3 % (ref 0.0–14.0)
NEUT#: 2.7 10*3/uL (ref 1.5–6.5)
Platelets: 188 10*3/uL (ref 145–400)
RBC: 4.36 10*6/uL (ref 3.70–5.45)

## 2012-12-07 LAB — COMPREHENSIVE METABOLIC PANEL (CC13)
CO2: 32 mEq/L — ABNORMAL HIGH (ref 22–29)
Creatinine: 1.1 mg/dL (ref 0.6–1.1)
Glucose: 110 mg/dl — ABNORMAL HIGH (ref 70–99)
Total Bilirubin: 0.77 mg/dL (ref 0.20–1.20)

## 2012-12-10 ENCOUNTER — Other Ambulatory Visit: Payer: Self-pay | Admitting: *Deleted

## 2013-01-01 ENCOUNTER — Encounter: Payer: Self-pay | Admitting: Cardiology

## 2013-01-01 ENCOUNTER — Ambulatory Visit (INDEPENDENT_AMBULATORY_CARE_PROVIDER_SITE_OTHER): Payer: Medicare Other | Admitting: Cardiology

## 2013-01-01 VITALS — BP 118/80 | HR 82 | Ht 64.0 in | Wt 178.0 lb

## 2013-01-01 DIAGNOSIS — I252 Old myocardial infarction: Secondary | ICD-10-CM

## 2013-01-01 DIAGNOSIS — I2510 Atherosclerotic heart disease of native coronary artery without angina pectoris: Secondary | ICD-10-CM | POA: Diagnosis not present

## 2013-01-01 DIAGNOSIS — I10 Essential (primary) hypertension: Secondary | ICD-10-CM | POA: Diagnosis not present

## 2013-01-01 NOTE — Patient Instructions (Addendum)
  Your physician recommends that you return for fasting lab work.  Lipid Panel and CMET

## 2013-01-01 NOTE — Progress Notes (Signed)
HPI [name] returns today for her history of coronary disease, history of myocardial infarction, hypertension, and hyperlipidemia.  Her last visit her blood pressure was poorly controlled. Today it's quite good. He seems to be maintaining her weight without gaining further. She denies any angina or ischemic symptoms. Looking back at her chart, do not see lipids nor LFTs in the past year.  Her biggest concern is dependent edema. She denies any orthopnea, PND.  Past Medical History  Diagnosis Date  . Myocardial infarct 2005  . CAD (coronary artery disease)   . Hypertension   . Long term (current) use of anticoagulants   . Edema   . Hematoma   . Sinusitis acute   . Adenocarcinoma of breast     right  . Acute upper respiratory infections of unspecified site   . Depression   . Anal fissure   . GERD (gastroesophageal reflux disease)   . Dog bite(E906.0)   . Diverticulosis of colon (without mention of hemorrhage)   . Spinal stenosis, lumbar region, without neurogenic claudication   . Anxiety   . Panic attacks   . Asthma   . Irritable bowel syndrome   . Hiatal hernia   . Jaundice     Hx of Jaundice at age 59 from "dirty restuarant". Unsure of Hepatitis type    Current Outpatient Prescriptions  Medication Sig Dispense Refill  . acetaminophen (TYLENOL) 500 MG tablet Take 500 mg by mouth every 6 (six) hours as needed. pain      . ADVAIR DISKUS 100-50 MCG/DOSE AEPB       . albuterol (PROVENTIL HFA;VENTOLIN HFA) 108 (90 BASE) MCG/ACT inhaler Inhale 2 puffs into the lungs every 6 (six) hours as needed. For shortness of breath      . ALPRAZolam (XANAX) 1 MG tablet Take 1 mg by mouth 3 (three) times daily as needed. 1/2 to 1 tablet 3 times daily as needed anxiety      . amLODipine (NORVASC) 5 MG tablet Take 5 mg by mouth daily.      . Ascorbic Acid (VITAMIN C) 500 MG tablet Take 500 mg by mouth daily.        Marland Kitchen aspirin 81 MG tablet Take 81 mg by mouth daily.        . beclomethasone  (QVAR) 40 MCG/ACT inhaler Inhale 2 puffs into the lungs 2 (two) times daily.      . bisoprolol (ZEBETA) 10 MG tablet       . Calcium Carbonate-Vitamin D (CALCIUM-CARB 600 + D) 600-125 MG-UNIT TABS Take 1 capsule by mouth daily.        . cetirizine (ZYRTEC ALLERGY) 10 MG tablet Take 10 mg by mouth daily.      . cycloSPORINE (RESTASIS) 0.05 % ophthalmic emulsion Place 1 drop into both eyes 2 (two) times daily.        . furosemide (LASIX) 40 MG tablet Take 1 tablet by mouth daily.      . metoprolol (LOPRESSOR) 50 MG tablet Take 1 tablet (50 mg total) by mouth 2 (two) times daily.  180 tablet  3  . Multiple Vitamin (MULTIVITAMIN WITH MINERALS) TABS Take 1 tablet by mouth daily.      . nitroGLYCERIN (NITROSTAT) 0.4 MG SL tablet Place 1 tablet (0.4 mg total) under the tongue every 5 (five) minutes as needed for chest pain.  90 tablet  3  . OVER THE COUNTER MEDICATION Take 1 capsule by mouth daily. [facility] probiotic digestive care: benificail bacteria,  with b, infantis 10mg       . pantoprazole (PROTONIX) 40 MG tablet Take 40 mg by mouth daily.      . pravastatin (PRAVACHOL) 40 MG tablet Take 40 mg by mouth daily.        . traMADol (ULTRAM) 50 MG tablet Take 1 tablet (50 mg total) by mouth every 6 (six) hours as needed for pain.  30 tablet  1  . valsartan (DIOVAN) 160 MG tablet Take 1 tablet (160 mg total) by mouth daily.      . vitamin E (VITAMIN E) 400 UNIT capsule Take 400 Units by mouth daily.       No current facility-administered medications for this visit.    Allergies  Allergen Reactions  . Amoxicillin Swelling    Throat Swells  . Azithromycin Swelling    Throat Swelling  . Bromfed Swelling    Throat Swelling  . Cephalexin Swelling    Throat Swelling  . Clidinium-Chlordiazepoxide Swelling    Throat Swelling  . Clotrimazole Other (See Comments) and Hypertension    Patient told me that she couldn't swallow due to the medication  . Dicyclomine Hcl Hives  . Hydralazine Hcl     Rash  and itching  . Ibuprofen     N/V  . Iohexol      Code: HIVES, Desc: throat swelling no hives 20 yrs ago;needs pre-medication  09/19/07 sg, Onset Date: [id number]   . Latex Swelling    Blisters on Skin  . Lidocaine Hives  . Paroxetine Swelling    Throat Swelling  . Penicillins Hives  . Propoxyphene-Acetaminophen Swelling    REACTION: swelling in the throat  . Sertraline Hcl Swelling    Throat Swelling  . Sulfadiazine Swelling    Throat Swelling  . Verapamil Swelling    Throat Swelling    Family History  Problem Relation Age of Onset  . Hypertension Mother   . Heart disease Mother   . Dementia Mother   . Arthritis Mother   . Diabetes Mother   . Pancreatic cancer Father   . Diabetes Father   . Hypertension Father   . Hypertension Sister   . Hypertension Brother   . Heart disease Sister   . Rectal cancer Neg Hx   . Stomach cancer Neg Hx   . Colon cancer Cousin     History   Social History  . Marital Status: Divorced    Spouse Name: N/A    Number of Children: 4  . Years of Education: N/A   Occupational History  . Teacher    Social History Main Topics  . Smoking status: Former Smoker -- 0.30 packs/day for 8 years    Types: Cigarettes    Quit date: 09/13/1983  . Smokeless tobacco: Never Used  . Alcohol Use: No  . Drug Use: No  . Sexually Active: Not on file   Other Topics Concern  . Not on file   Social History Narrative  . No narrative on file    ROS ALL NEGATIVE EXCEPT THOSE NOTED IN HPI  PE  General Appearance: well developed, well nourished in no acute distress , overweight HEENT: symmetrical face, PERRLA, good dentition  Neck: no JVD, thyromegaly, or adenopathy, trachea midline Chest: symmetric without deformity Cardiac: PMI POORLY APPRECIATED RRR, normal S1, S2, no gallop or murmur Lung: clear to ausculation and percussion Vascular: all pulses full without bruits  Abdominal: nondistended, nontender, good bowel sounds, no HSM, no  bruits Extremities: no cyanosis, clubbing, minimal  bilateral lower extremity edema, no sign of DVT, no varicosities  Skin: normal color, no rashes Neuro: alert and oriented x 3, non-focal Pysch: normal affect  EKG  BMET    Component Value Date/Time   NA 145 12/07/2012 1341   NA 140 06/19/2012 1510   K 3.7 12/07/2012 1341   K 3.4* 06/19/2012 1510   CL 104 12/07/2012 1341   CL 104 06/19/2012 1510   CO2 32* 12/07/2012 1341   CO2 31 06/19/2012 1510   GLUCOSE 110* 12/07/2012 1341   GLUCOSE 94 06/19/2012 1510   BUN 14.0 12/07/2012 1341   BUN 17 06/19/2012 1510   CREATININE 1.1 12/07/2012 1341   CREATININE 0.8 06/19/2012 1510   CALCIUM 10.0 12/07/2012 1341   CALCIUM 9.5 06/19/2012 1510   GFRNONAA >60 03/05/2011 0208   GFRAA >60 03/05/2011 0208    Lipid Panel     Component Value Date/Time   CHOL  Value: 137        ATP III CLASSIFICATION:  <200     mg/dL   Desirable  409-811  mg/dL   Borderline High  >=914    mg/dL   High        78/10/9560 0550   TRIG 55 07/17/2010 0550   HDL 51 07/17/2010 0550   CHOLHDL 2.7 07/17/2010 0550   VLDL 11 07/17/2010 0550   LDLCALC  Value: 75        Total Cholesterol/HDL:CHD Risk Coronary Heart Disease Risk Table                     Men   Women  1/2 Average Risk   3.4   3.3  Average Risk       5.0   4.4  2 X Average Risk   9.6   7.1  3 X Average Risk  23.4   11.0        Use the calculated Patient Ratio above and the CHD Risk Table to determine the patient's CHD Risk.        ATP III CLASSIFICATION (LDL):  <100     mg/dL   Optimal  130-865  mg/dL   Near or Above                    Optimal  130-159  mg/dL   Borderline  784-696  mg/dL   High  >295     mg/dL   Very High 28/12/1322 4010    CBC    Component Value Date/Time   WBC 4.3 12/07/2012 1341   WBC 5.4 03/17/2011 1118   RBC 4.36 12/07/2012 1341   RBC 4.14 03/17/2011 1118   HGB 13.1 12/07/2012 1341   HGB 12.9 03/17/2011 1118   HCT 39.2 12/07/2012 1341   HCT 37.7 03/17/2011 1118   PLT 188 12/07/2012 1341   PLT 207.0 03/17/2011 1118    MCV 89.9 12/07/2012 1341   MCV 91.0 03/17/2011 1118   MCH 30.0 12/07/2012 1341   MCH 29.1 03/05/2011 0208   MCHC 33.4 12/07/2012 1341   MCHC 34.2 03/17/2011 1118   RDW 13.4 12/07/2012 1341   RDW 13.7 03/17/2011 1118   LYMPHSABS 1.2 12/07/2012 1341   LYMPHSABS 1.3 03/17/2011 1118   MONOABS 0.3 12/07/2012 1341   MONOABS 0.4 03/17/2011 1118   EOSABS 0.1 12/07/2012 1341   EOSABS 0.0 03/17/2011 1118   BASOSABS 0.0 12/07/2012 1341   BASOSABS 0.0 03/17/2011 1118

## 2013-01-01 NOTE — Assessment & Plan Note (Signed)
Stable. Continue secondary preventative therapy. Will arrange for fasting lipids and a comprehensive metabolic profile. Patient encouraged to lose weight. Will remain on current meds for now. Return the office in one year with Dr. Delton See.

## 2013-01-01 NOTE — Assessment & Plan Note (Signed)
Much better control. Continue salt restriction and weight loss. No change in medications.

## 2013-01-02 ENCOUNTER — Telehealth: Payer: Self-pay | Admitting: *Deleted

## 2013-01-02 ENCOUNTER — Telehealth: Payer: Self-pay | Admitting: Cardiology

## 2013-01-02 ENCOUNTER — Ambulatory Visit (INDEPENDENT_AMBULATORY_CARE_PROVIDER_SITE_OTHER): Payer: Medicare Other | Admitting: Cardiology

## 2013-01-02 ENCOUNTER — Other Ambulatory Visit: Payer: Self-pay | Admitting: *Deleted

## 2013-01-02 DIAGNOSIS — I2510 Atherosclerotic heart disease of native coronary artery without angina pectoris: Secondary | ICD-10-CM

## 2013-01-02 DIAGNOSIS — I252 Old myocardial infarction: Secondary | ICD-10-CM | POA: Diagnosis not present

## 2013-01-02 DIAGNOSIS — E876 Hypokalemia: Secondary | ICD-10-CM

## 2013-01-02 DIAGNOSIS — I10 Essential (primary) hypertension: Secondary | ICD-10-CM

## 2013-01-02 LAB — COMPREHENSIVE METABOLIC PANEL
Albumin: 3.9 g/dL (ref 3.5–5.2)
CO2: 29 mEq/L (ref 19–32)
GFR: 80.15 mL/min (ref >60.00)
Glucose, Bld: 100 mg/dL — ABNORMAL HIGH (ref 70–99)
Potassium: 3.3 mEq/L — ABNORMAL LOW (ref 3.5–5.1)
Sodium: 141 mEq/L (ref 135–145)
Total Protein: 6.6 g/dL (ref 6.0–8.3)

## 2013-01-02 LAB — LIPID PANEL: Cholesterol: 146 mg/dL (ref 0–200)

## 2013-01-02 MED ORDER — FUROSEMIDE 40 MG PO TABS: 40.0000 mg | ORAL_TABLET | Freq: Every day | ORAL | Status: DC

## 2013-01-02 MED ORDER — POTASSIUM CHLORIDE CRYS ER 20 MEQ PO TBCR: 20.0000 meq | EXTENDED_RELEASE_TABLET | Freq: Every day | ORAL | Status: DC

## 2013-01-02 NOTE — Telephone Encounter (Signed)
Needs refill on Lasix

## 2013-01-02 NOTE — Telephone Encounter (Signed)
Walk In Pt Form " pt Needs Refill" Sent to Message Nurse  01/02/13/KM

## 2013-01-02 NOTE — Telephone Encounter (Signed)
K+ low,  kdur 20 meq ordered daily, lab redraw 01/09/13, pt agreed to plan.

## 2013-01-04 ENCOUNTER — Telehealth: Payer: Self-pay | Admitting: Cardiology

## 2013-01-04 DIAGNOSIS — E876 Hypokalemia: Secondary | ICD-10-CM

## 2013-01-04 MED ORDER — POTASSIUM CHLORIDE CRYS ER 20 MEQ PO TBCR: 40.0000 meq | EXTENDED_RELEASE_TABLET | Freq: Every day | ORAL | Status: DC

## 2013-01-04 NOTE — Telephone Encounter (Signed)
Follow up  ° ° °Patient returning call back to nurse  °

## 2013-01-04 NOTE — Telephone Encounter (Signed)
Pt returns call & lab results given.  She will increase potassium to daily per Dr. Daleen Squibb Return for bmp next week Pt verbalizes understanding. New prescription e-scribed. Mylo Red RN

## 2013-01-04 NOTE — Telephone Encounter (Signed)
Received 42 pages of medical records from [facility]. Placed in Dr. Vern Claude box for review.  rmf

## 2013-01-15 ENCOUNTER — Telehealth: Payer: Self-pay | Admitting: Cardiology

## 2013-01-15 NOTE — Telephone Encounter (Signed)
Records Rec From [facility] gave to [facility] 01/15/13/KM

## 2013-01-15 NOTE — Telephone Encounter (Signed)
Records Rec From [facility], gave to [facility] 01/15/13/KM

## 2013-01-29 ENCOUNTER — Other Ambulatory Visit: Payer: Self-pay | Admitting: Oncology

## 2013-01-29 DIAGNOSIS — Z853 Personal history of malignant neoplasm of breast: Secondary | ICD-10-CM

## 2013-02-11 ENCOUNTER — Ambulatory Visit
Admission: RE | Admit: 2013-02-11 | Discharge: 2013-02-11 | Disposition: A | Discharge status: Home / Self Care | Payer: Medicare Other | Source: Ambulatory Visit | Attending: Oncology | Admitting: Oncology

## 2013-02-11 DIAGNOSIS — Z853 Personal history of malignant neoplasm of breast: Secondary | ICD-10-CM

## 2013-03-22 DIAGNOSIS — J019 Acute sinusitis, unspecified: Secondary | ICD-10-CM | POA: Diagnosis not present

## 2013-03-29 ENCOUNTER — Ambulatory Visit (INDEPENDENT_AMBULATORY_CARE_PROVIDER_SITE_OTHER)
Admission: RE | Admit: 2013-03-29 | Discharge: 2013-03-29 | Disposition: A | Discharge status: Home / Self Care | Payer: Medicare Other | Source: Ambulatory Visit | Attending: Physician Assistant | Admitting: Physician Assistant

## 2013-03-29 ENCOUNTER — Other Ambulatory Visit (INDEPENDENT_AMBULATORY_CARE_PROVIDER_SITE_OTHER): Payer: Medicare Other

## 2013-03-29 ENCOUNTER — Ambulatory Visit (INDEPENDENT_AMBULATORY_CARE_PROVIDER_SITE_OTHER): Payer: Medicare Other | Admitting: Physician Assistant

## 2013-03-29 ENCOUNTER — Encounter: Payer: Self-pay | Admitting: Physician Assistant

## 2013-03-29 ENCOUNTER — Telehealth: Payer: Self-pay | Admitting: Gastroenterology

## 2013-03-29 VITALS — BP 120/70 | HR 80 | Ht 64.0 in | Wt 173.0 lb

## 2013-03-29 DIAGNOSIS — R109 Unspecified abdominal pain: Secondary | ICD-10-CM

## 2013-03-29 DIAGNOSIS — D3000 Benign neoplasm of unspecified kidney: Secondary | ICD-10-CM | POA: Diagnosis not present

## 2013-03-29 DIAGNOSIS — R140 Abdominal distension (gaseous): Secondary | ICD-10-CM

## 2013-03-29 DIAGNOSIS — R10819 Abdominal tenderness, unspecified site: Secondary | ICD-10-CM | POA: Diagnosis not present

## 2013-03-29 DIAGNOSIS — R197 Diarrhea, unspecified: Secondary | ICD-10-CM | POA: Diagnosis not present

## 2013-03-29 DIAGNOSIS — R143 Flatulence: Secondary | ICD-10-CM | POA: Diagnosis not present

## 2013-03-29 DIAGNOSIS — R141 Gas pain: Secondary | ICD-10-CM | POA: Diagnosis not present

## 2013-03-29 DIAGNOSIS — K5730 Diverticulosis of large intestine without perforation or abscess without bleeding: Secondary | ICD-10-CM | POA: Diagnosis not present

## 2013-03-29 LAB — C-REACTIVE PROTEIN: CRP: <0.5 mg/dL (ref 0.5–20.0)

## 2013-03-29 LAB — CBC WITH DIFFERENTIAL/PLATELET
Basophils Relative: 0.4 % (ref 0.0–3.0)
Eosinophils Relative: 0.8 % (ref 0.0–5.0)
Hemoglobin: 14.1 g/dL (ref 12.0–15.0)
Lymphocytes Relative: 23.8 % (ref 12.0–46.0)
Monocytes Relative: 6.1 % (ref 3.0–12.0)
Neutro Abs: 5 10*3/uL (ref 1.4–7.7)
Neutrophils Relative %: 68.9 % (ref 43.0–77.0)
RBC: 4.62 Mil/uL (ref 3.87–5.11)
WBC: 7.2 10*3/uL (ref 4.5–10.5)

## 2013-03-29 MED ORDER — TRAMADOL HCL 50 MG PO TABS: 50.0000 mg | ORAL_TABLET | Freq: Four times a day (QID) | ORAL | Status: DC | PRN

## 2013-03-29 NOTE — Telephone Encounter (Signed)
Pt has not been seen since 08/2012 and has an extensive GI hx including IBS, GERD and Constipation . Last COLON 05/11/2012 showed moderate diverticulosis in descending and sigmoid colon. She has a hx of MIs and normal EM and EGD. Pt reports months of her stomach hurting and abdominal swelling. She states pain has worsened this week and she has diarrhea. The pain is located above her waist on the right side, she has no fever. She had 3 loose stools yesterday that turned into watery ones. Can't eat,doesn't want anything, but denies nausea and reports the pain is worse when she bends over. Pt given an appt with Mike Gip, PA this am.

## 2013-03-29 NOTE — Progress Notes (Signed)
Subjective:    Patient ID: [name], female    DOB: 1950-11-17, 62 y.o.   MRN: [id number]  HPI  [name] is a 62 year old white female known to Dr. Jarold Motto. She has history of hypertension, coronary artery disease is status post MI in 2005. She also has history of asthma, hyperlipidemia, and breast cancer  which was diagnosed in 2007. She is status post lumpectomy and underwent chemotherapy and radiation, and says she has been disease free. She had undergone colonoscopy in August of 2013 which showed moderate diverticulosis of the left colon. She had EGD in December of 2013 which was a normal exam and then underwent esophageal manometry which was also normal. Upper abnormal ultrasound was also done in December 2013 which was negative status post cholecystectomy   She comes in today stating that she's been having abdominal pain over the past month or so which had initially been intermittent and associated with bloating and abdominal distention particularly in the evenings. She says over the past couple of days he has developed more constant abdominal pain and a sensation of abdominal swelling .She says she hurts particularly with bending over and feels worse after eating. Her appetite has been decreased. She's not had any weight loss. No fever or chills. She's had some vague nausea but no vomiting. She says she has had formed stools followed by loose stools on a daily basis over the past few months. No recent change no melena or hematochezia. She's currently taking a course of doxycycline for sinusitis but just started this earlier this week.    Review of Systems  Constitutional: Positive for appetite change and fatigue.  HENT: Negative.   Eyes: Negative.   Respiratory: Negative.   Cardiovascular: Negative.   Gastrointestinal: Positive for abdominal pain, diarrhea and abdominal distention.  Endocrine: Negative.   Genitourinary: Negative.   Musculoskeletal: Negative.   Skin: Negative.    Allergic/Immunologic: Negative.   Neurological: Negative.   Hematological: Negative.   Psychiatric/Behavioral: Negative.    Outpatient Prescriptions Prior to Visit  Medication Sig Dispense Refill  . ADVAIR DISKUS 100-50 MCG/DOSE AEPB       . albuterol (PROVENTIL HFA;VENTOLIN HFA) 108 (90 BASE) MCG/ACT inhaler Inhale 2 puffs into the lungs every 6 (six) hours as needed. For shortness of breath      . ALPRAZolam (XANAX) 1 MG tablet Take 1 mg by mouth 3 (three) times daily as needed. 1/2 to 1 tablet 3 times daily as needed anxiety      . amLODipine (NORVASC) 5 MG tablet Take 5 mg by mouth daily.      . Ascorbic Acid (VITAMIN C) 500 MG tablet Take 500 mg by mouth daily.        Marland Kitchen aspirin 81 MG tablet Take 81 mg by mouth daily.        . beclomethasone (QVAR) 40 MCG/ACT inhaler Inhale 2 puffs into the lungs 2 (two) times daily.      . bisoprolol (ZEBETA) 10 MG tablet       . Calcium Carbonate-Vitamin D (CALCIUM-CARB 600 + D) 600-125 MG-UNIT TABS Take 1 capsule by mouth daily.        . cetirizine (ZYRTEC ALLERGY) 10 MG tablet Take 10 mg by mouth daily.      . cycloSPORINE (RESTASIS) 0.05 % ophthalmic emulsion Place 1 drop into both eyes 2 (two) times daily.        . furosemide (LASIX) 40 MG tablet Take 1 tablet (40 mg total) by mouth daily.  30 tablet  11  . metoprolol (LOPRESSOR) 50 MG tablet Take 1 tablet (50 mg total) by mouth 2 (two) times daily.  180 tablet  3  . Multiple Vitamin (MULTIVITAMIN WITH MINERALS) TABS Take 1 tablet by mouth daily.      . nitroGLYCERIN (NITROSTAT) 0.4 MG SL tablet Place 1 tablet (0.4 mg total) under the tongue every 5 (five) minutes as needed for chest pain.  90 tablet  3  . OVER THE COUNTER MEDICATION Take 1 capsule by mouth daily. [facility] probiotic digestive care: benificail bacteria, with b, infantis 10mg       . pantoprazole (PROTONIX) 40 MG tablet Take 40 mg by mouth daily.      . potassium chloride SA (K-DUR,KLOR-CON) 20 MEQ tablet Take 2 tablets (40 mEq  total) by mouth daily.  60 tablet  5  . pravastatin (PRAVACHOL) 40 MG tablet Take 40 mg by mouth daily.        . valsartan (DIOVAN) 160 MG tablet Take 1 tablet (160 mg total) by mouth daily.      . vitamin E (VITAMIN E) 400 UNIT capsule Take 400 Units by mouth daily.      Marland Kitchen acetaminophen (TYLENOL) 500 MG tablet Take 500 mg by mouth every 6 (six) hours as needed. pain      . traMADol (ULTRAM) 50 MG tablet Take 1 tablet (50 mg total) by mouth every 6 (six) hours as needed for pain.  30 tablet  1   No facility-administered medications prior to visit.   Allergies  Allergen Reactions  . Amoxicillin Swelling    Throat Swells  . Azithromycin Swelling    Throat Swelling  . Bromfed Swelling    Throat Swelling  . Cephalexin Swelling    Throat Swelling  . Clidinium-Chlordiazepoxide Swelling    Throat Swelling  . Clotrimazole Other (See Comments) and Hypertension    Patient told me that she couldn't swallow due to the medication  . Dicyclomine Hcl Hives  . Hydralazine Hcl     Rash and itching  . Ibuprofen     N/V  . Iohexol      Code: HIVES, Desc: throat swelling no hives 20 yrs ago;needs pre-medication  09/19/07 sg, Onset Date: [id number]   . Latex Swelling    Blisters on Skin  . Lidocaine Hives  . Paroxetine Swelling    Throat Swelling  . Penicillins Hives  . Propoxyphene-Acetaminophen Swelling    REACTION: swelling in the throat  . Sertraline Hcl Swelling    Throat Swelling  . Sulfadiazine Swelling    Throat Swelling  . Verapamil Swelling    Throat Swelling   Patient Active Problem List   Diagnosis Date Noted  . Dysphagia 06/10/2011  . Throat pain 06/10/2011  . Elevated blood sugar level 06/09/2011  . Flatulence, eructation, and gas pain 05/23/2011  . Abdominal pain, epigastric 05/23/2011  . Hiatal hernia 05/23/2011  . GERD (gastroesophageal reflux disease) 05/13/2011  . Personal history of colonic polyps 05/13/2011  . IBS (irritable bowel syndrome) 05/13/2011  . Chronic  epigastric pain 05/13/2011  . Bloating 05/13/2011  . Cough 04/07/2011  . OLD MYOCARDIAL INFARCTION 01/28/2010  . DIARRHEA 04/14/2008  . HEMATOMA 01/15/2008  . ASTHMA 11/26/2007  . DEPRESSION 09/28/2007  . BREAST CANCER, HX OF 09/28/2007  . ABRASION/FRICION BURN OTH MX&UNS SITE W/O INF 09/24/2007  . CONSTIPATION 08/21/2007  . ANAL FISSURE 08/21/2007  . GASTROESOPHAGEAL REFLUX DISEASE 06/15/2007  . DISORDER, BIPOLAR NOS 06/01/2007  . LOW BACK PAIN  06/01/2007  . ANXIETY 03/29/2007  . PANIC ATTACK 03/29/2007  . HYPERTENSION 03/29/2007  . CORONARY ARTERY DISEASE 03/29/2007  . DIVERTICULOSIS, COLON 03/29/2007   History  Substance Use Topics  . Smoking status: Former Smoker -- 0.30 packs/day for 8 years    Types: Cigarettes    Quit date: 09/13/1983  . Smokeless tobacco: Never Used  . Alcohol Use: No   family history includes Arthritis in her mother; Colon cancer in her cousin; Dementia in her mother; Diabetes in her father and mother; Heart disease in her mother and sister; Hypertension in her brother, father, mother, and sister; and Pancreatic cancer in her father.  There is no history of Rectal cancer and Stomach cancer.     Objective:   Physical Exam well-developed white female in no acute distress anxious appearing blood pressure 120/70 pulse 80 height 5 foot 4 weight 173. HEENT; nontraumatic normocephalic EOMI PERRLA sclera anicteric, Supple ;no JVD, Cardiovascular; regular rate and rhythm with S1-S2 no murmur or gallop, capillary clear bilaterally, Abdomen ;soft bowel sounds are present she is rather diffusely tender but more markedly so in the left mid quadrant left lower ordered there is no guarding or rebound no palpable mass or hepatosplenomegaly no fluid wave, she has a cholecystectomy incisional scar, Rectal; exam Brown Hemoccult negative stool, Extremities; no clubbing cyanosis or edema skin warm and dry, Psych; mood and affect normal and appropriate      Assessment &  Plan:   #33 62 year old female with a several week history of postprandial abdominal bloating swelling and discomfort now progressive over the past 2 days with constant fairly generalized abdominal pain. Etiology is not clear we'll rule out partial obstruction versus acute inflammatory process #2 history of coronary artery disease status post MI 2005 #3 Hypertension #4 hyperlipidemia #5 asthma #6 history of breast cancer 2007 status post lumpectomy followed by chemotherapy and radiation #7 diverticulosis  Plan; is on labs today to include CBC with differential C. met and CRP Schedule for CT scan of the abdomen and pelvis with oral but no IV contrast due to allergy Ultram 50 mg every 6 hours as needed for pain #50 and no refills Further plans pending results of labs and CT

## 2013-03-29 NOTE — Patient Instructions (Addendum)
Your physician has requested that you go to the basement for the following lab work before leaving today: CBC/diff, CMET, Lipase, CRP  We have sent the following medications to your pharmacy for you to pick up at your convenience: Ultram  You have been scheduled for a CT scan of the abdomen and pelvis at [facility] ([address]---this is in the same building as [facility]).   You are scheduled on 03/29/13 at 3:00pm. You should arrive 15 minutes prior to your appointment time for registration. Please follow the written instructions below on the day of your exam:  WARNING: IF YOU ARE ALLERGIC TO IODINE/X-RAY DYE, PLEASE NOTIFY RADIOLOGY IMMEDIATELY AT [phone]! YOU WILL BE GIVEN A 13 HOUR PREMEDICATION PREP.  1) Do not eat or drink anything after 11:00am (4 hours prior to your test) 2) You have been given 2 bottles of oral contrast to drink. The solution may taste               better if refrigerated, but do NOT add ice or any other liquid to this solution. Shake             well before drinking.    Drink 1 bottle of contrast @ 1:00pm(2 hours prior to your exam)  Drink 1 bottle of contrast @ 2:00pm (1 hour prior to your exam)  You may take any medications as prescribed with a small amount of water except for the following: Metformin, Glucophage, Glucovance, Avandamet, Riomet, Fortamet, Actoplus Met, Janumet, Glumetza or Metaglip. The above medications must be held the day of the exam AND 48 hours after the exam.  The purpose of you drinking the oral contrast is to aid in the visualization of your intestinal tract. The contrast solution may cause some diarrhea. Before your exam is started, you will be given a small amount of fluid to drink. Depending on your individual set of symptoms, you may also receive an intravenous injection of x-ray contrast/dye. Plan on being at [facility] [address] for 30 minutes or long, depending on the type of exam you are having  performed.  This test typically takes 30-45 minutes to complete.  If you have any questions regarding your exam or if you need to reschedule, you may call the CT department at [phone] between the hours of 8:00 am and 5:00 pm, Monday-Friday.  ________________________________________________________________________  I appreciate the opportunity to care for you.

## 2013-03-30 ENCOUNTER — Telehealth: Payer: Self-pay | Admitting: Gastroenterology

## 2013-03-30 NOTE — Telephone Encounter (Signed)
Seen in the office yesterday for abdominal pain and diarrhea. She called today complaining of worsening diarrhea. I reviewed her lab CT reports which all were unremarkable. Pain is actually less but diarrhea is more.  Patient was instructed to take 2 Imodium and then one to 2 every 6 hours. If diarrhea is not improved over the weekend she will call the office on Monday.

## 2013-04-05 NOTE — Telephone Encounter (Signed)
Closed encounter °

## 2013-04-25 DIAGNOSIS — E785 Hyperlipidemia, unspecified: Secondary | ICD-10-CM | POA: Diagnosis not present

## 2013-04-25 DIAGNOSIS — F341 Dysthymic disorder: Secondary | ICD-10-CM | POA: Diagnosis not present

## 2013-04-25 DIAGNOSIS — R197 Diarrhea, unspecified: Secondary | ICD-10-CM | POA: Diagnosis not present

## 2013-04-25 DIAGNOSIS — I10 Essential (primary) hypertension: Secondary | ICD-10-CM | POA: Diagnosis not present

## 2013-04-25 DIAGNOSIS — K219 Gastro-esophageal reflux disease without esophagitis: Secondary | ICD-10-CM | POA: Diagnosis not present

## 2013-04-25 DIAGNOSIS — F411 Generalized anxiety disorder: Secondary | ICD-10-CM | POA: Diagnosis not present

## 2013-04-25 DIAGNOSIS — R609 Edema, unspecified: Secondary | ICD-10-CM | POA: Diagnosis not present

## 2013-06-10 DIAGNOSIS — Z01419 Encounter for gynecological examination (general) (routine) without abnormal findings: Secondary | ICD-10-CM | POA: Diagnosis not present

## 2013-06-10 DIAGNOSIS — IMO0002 Reserved for concepts with insufficient information to code with codable children: Secondary | ICD-10-CM | POA: Diagnosis not present

## 2013-06-10 DIAGNOSIS — R109 Unspecified abdominal pain: Secondary | ICD-10-CM | POA: Diagnosis not present

## 2013-06-11 DIAGNOSIS — Z7982 Long term (current) use of aspirin: Secondary | ICD-10-CM | POA: Diagnosis not present

## 2013-06-11 DIAGNOSIS — I10 Essential (primary) hypertension: Secondary | ICD-10-CM | POA: Diagnosis not present

## 2013-06-11 DIAGNOSIS — R350 Frequency of micturition: Secondary | ICD-10-CM | POA: Diagnosis not present

## 2013-06-11 DIAGNOSIS — R609 Edema, unspecified: Secondary | ICD-10-CM | POA: Diagnosis not present

## 2013-06-11 DIAGNOSIS — Z79899 Other long term (current) drug therapy: Secondary | ICD-10-CM | POA: Diagnosis not present

## 2013-06-11 DIAGNOSIS — M549 Dorsalgia, unspecified: Secondary | ICD-10-CM | POA: Diagnosis not present

## 2013-06-11 DIAGNOSIS — R109 Unspecified abdominal pain: Secondary | ICD-10-CM | POA: Diagnosis not present

## 2013-06-13 ENCOUNTER — Ambulatory Visit: Payer: Medicare Other | Admitting: Oncology

## 2013-06-13 ENCOUNTER — Telehealth: Payer: Self-pay | Admitting: *Deleted

## 2013-06-13 ENCOUNTER — Other Ambulatory Visit: Payer: Medicare Other | Admitting: Lab

## 2013-06-13 NOTE — Telephone Encounter (Signed)
Called pt to let her know that Dr. Welton Flakes is sick and cannot see her today and she understood.  Rescheduled and confirmed 06/25/13 appt w/ pt.

## 2013-06-17 DIAGNOSIS — F411 Generalized anxiety disorder: Secondary | ICD-10-CM | POA: Diagnosis not present

## 2013-06-17 DIAGNOSIS — F410 Panic disorder [episodic paroxysmal anxiety] without agoraphobia: Secondary | ICD-10-CM | POA: Diagnosis not present

## 2013-06-20 ENCOUNTER — Ambulatory Visit (INDEPENDENT_AMBULATORY_CARE_PROVIDER_SITE_OTHER): Payer: Medicare Other | Admitting: Psychiatry

## 2013-06-20 DIAGNOSIS — F4323 Adjustment disorder with mixed anxiety and depressed mood: Secondary | ICD-10-CM

## 2013-06-21 ENCOUNTER — Telehealth: Payer: Self-pay | Admitting: Oncology

## 2013-06-21 NOTE — Telephone Encounter (Signed)
pt called to confirm appts for 10/14 shh °

## 2013-06-21 NOTE — Telephone Encounter (Signed)
pt called to confirm appts for 10/14 shh

## 2013-06-25 ENCOUNTER — Telehealth: Payer: Self-pay | Admitting: Oncology

## 2013-06-25 ENCOUNTER — Ambulatory Visit: Payer: Federal, State, Local not specified - Other | Admitting: Psychiatry

## 2013-06-25 ENCOUNTER — Ambulatory Visit: Payer: Medicare Other | Admitting: Oncology

## 2013-06-25 ENCOUNTER — Other Ambulatory Visit: Payer: Medicare Other | Admitting: Lab

## 2013-06-25 DIAGNOSIS — IMO0002 Reserved for concepts with insufficient information to code with codable children: Secondary | ICD-10-CM | POA: Diagnosis not present

## 2013-06-25 DIAGNOSIS — M545 Low back pain, unspecified: Secondary | ICD-10-CM | POA: Diagnosis not present

## 2013-06-25 DIAGNOSIS — Z5181 Encounter for therapeutic drug level monitoring: Secondary | ICD-10-CM | POA: Diagnosis not present

## 2013-06-25 DIAGNOSIS — Z79899 Other long term (current) drug therapy: Secondary | ICD-10-CM | POA: Diagnosis not present

## 2013-06-25 DIAGNOSIS — G894 Chronic pain syndrome: Secondary | ICD-10-CM | POA: Diagnosis not present

## 2013-06-25 DIAGNOSIS — M533 Sacrococcygeal disorders, not elsewhere classified: Secondary | ICD-10-CM | POA: Diagnosis not present

## 2013-06-25 NOTE — Telephone Encounter (Signed)
, °

## 2013-06-27 ENCOUNTER — Ambulatory Visit (INDEPENDENT_AMBULATORY_CARE_PROVIDER_SITE_OTHER): Payer: Medicare Other | Admitting: Psychiatry

## 2013-06-27 DIAGNOSIS — Z630 Problems in relationship with spouse or partner: Secondary | ICD-10-CM | POA: Diagnosis not present

## 2013-06-27 DIAGNOSIS — F4323 Adjustment disorder with mixed anxiety and depressed mood: Secondary | ICD-10-CM | POA: Diagnosis not present

## 2013-06-28 ENCOUNTER — Emergency Department (HOSPITAL_COMMUNITY)
Admission: EM | Admit: 2013-06-28 | Discharge: 2013-06-28 | Disposition: A | Discharge status: Home / Self Care | Payer: Medicare Other | Attending: Emergency Medicine | Admitting: Emergency Medicine

## 2013-06-28 ENCOUNTER — Encounter (HOSPITAL_COMMUNITY): Payer: Self-pay | Admitting: Emergency Medicine

## 2013-06-28 DIAGNOSIS — Z87891 Personal history of nicotine dependence: Secondary | ICD-10-CM | POA: Insufficient documentation

## 2013-06-28 DIAGNOSIS — Z79899 Other long term (current) drug therapy: Secondary | ICD-10-CM | POA: Diagnosis not present

## 2013-06-28 DIAGNOSIS — IMO0002 Reserved for concepts with insufficient information to code with codable children: Secondary | ICD-10-CM | POA: Diagnosis not present

## 2013-06-28 DIAGNOSIS — I2510 Atherosclerotic heart disease of native coronary artery without angina pectoris: Secondary | ICD-10-CM | POA: Insufficient documentation

## 2013-06-28 DIAGNOSIS — T502X1A Poisoning by carbonic-anhydrase inhibitors, benzothiadiazides and other diuretics, accidental (unintentional), initial encounter: Secondary | ICD-10-CM | POA: Diagnosis not present

## 2013-06-28 DIAGNOSIS — T465X1A Poisoning by other antihypertensive drugs, accidental (unintentional), initial encounter: Secondary | ICD-10-CM | POA: Insufficient documentation

## 2013-06-28 DIAGNOSIS — T503X1A Poisoning by electrolytic, caloric and water-balance agents, accidental (unintentional), initial encounter: Secondary | ICD-10-CM | POA: Insufficient documentation

## 2013-06-28 DIAGNOSIS — T46901A Poisoning by unspecified agents primarily affecting the cardiovascular system, accidental (unintentional), initial encounter: Secondary | ICD-10-CM | POA: Insufficient documentation

## 2013-06-28 DIAGNOSIS — E860 Dehydration: Secondary | ICD-10-CM | POA: Diagnosis not present

## 2013-06-28 DIAGNOSIS — Z91040 Latex allergy status: Secondary | ICD-10-CM | POA: Insufficient documentation

## 2013-06-28 DIAGNOSIS — F411 Generalized anxiety disorder: Secondary | ICD-10-CM | POA: Diagnosis not present

## 2013-06-28 DIAGNOSIS — Z880 Allergy status to penicillin: Secondary | ICD-10-CM | POA: Insufficient documentation

## 2013-06-28 DIAGNOSIS — Z9889 Other specified postprocedural states: Secondary | ICD-10-CM | POA: Insufficient documentation

## 2013-06-28 DIAGNOSIS — F329 Major depressive disorder, single episode, unspecified: Secondary | ICD-10-CM | POA: Diagnosis not present

## 2013-06-28 DIAGNOSIS — K219 Gastro-esophageal reflux disease without esophagitis: Secondary | ICD-10-CM | POA: Insufficient documentation

## 2013-06-28 DIAGNOSIS — Z9861 Coronary angioplasty status: Secondary | ICD-10-CM | POA: Insufficient documentation

## 2013-06-28 DIAGNOSIS — T50901A Poisoning by unspecified drugs, medicaments and biological substances, accidental (unintentional), initial encounter: Secondary | ICD-10-CM

## 2013-06-28 DIAGNOSIS — J45909 Unspecified asthma, uncomplicated: Secondary | ICD-10-CM | POA: Insufficient documentation

## 2013-06-28 DIAGNOSIS — I252 Old myocardial infarction: Secondary | ICD-10-CM | POA: Diagnosis not present

## 2013-06-28 DIAGNOSIS — I10 Essential (primary) hypertension: Secondary | ICD-10-CM | POA: Diagnosis not present

## 2013-06-28 DIAGNOSIS — Y9389 Activity, other specified: Secondary | ICD-10-CM | POA: Insufficient documentation

## 2013-06-28 DIAGNOSIS — Z7982 Long term (current) use of aspirin: Secondary | ICD-10-CM | POA: Diagnosis not present

## 2013-06-28 DIAGNOSIS — Z853 Personal history of malignant neoplasm of breast: Secondary | ICD-10-CM | POA: Insufficient documentation

## 2013-06-28 DIAGNOSIS — Y929 Unspecified place or not applicable: Secondary | ICD-10-CM | POA: Insufficient documentation

## 2013-06-28 DIAGNOSIS — F3289 Other specified depressive episodes: Secondary | ICD-10-CM | POA: Insufficient documentation

## 2013-06-28 DIAGNOSIS — R42 Dizziness and giddiness: Secondary | ICD-10-CM | POA: Diagnosis not present

## 2013-06-28 LAB — POCT I-STAT, CHEM 8
Calcium, Ion: 1.32 mmol/L — ABNORMAL HIGH (ref 1.13–1.30)
Creatinine, Ser: 1.1 mg/dL (ref 0.50–1.10)
Glucose, Bld: 122 mg/dL — ABNORMAL HIGH (ref 70–99)
Hemoglobin: 14.3 g/dL (ref 12.0–15.0)
Sodium: 143 mEq/L (ref 135–145)
TCO2: 26 mmol/L (ref 0–100)

## 2013-06-28 MED ORDER — POTASSIUM CHLORIDE CRYS ER 20 MEQ PO TBCR
40.0000 meq | EXTENDED_RELEASE_TABLET | Freq: Once | ORAL | Status: AC
  Administered 2013-06-28: 40 meq via ORAL
  Filled 2013-06-28: qty 2

## 2013-06-28 NOTE — ED Provider Notes (Addendum)
CSN: [id number]     Arrival date & time 06/28/13  1191 History   First MD Initiated Contact with Patient 06/28/13 [phone]     Chief Complaint  Patient presents with  . Dizziness   (Consider location/radiation/quality/duration/timing/severity/associated sxs/prior Treatment) HPI Patient reports she took one extra dose of Lasix, metoprolol, and Diovan last night by accident. SHe felt lightheaded and dry last night. Feels improved presently. No treatment prior to coming here no pain anywhere. No other associated symptoms. Past Medical History  Diagnosis Date  . Myocardial infarct 2005  . CAD (coronary artery disease)   . Hypertension   . Long term (current) use of anticoagulants   . Edema   . Hematoma   . Sinusitis acute   . Adenocarcinoma of breast     right  . Acute upper respiratory infections of unspecified site   . Depression   . Anal fissure   . GERD (gastroesophageal reflux disease)   . Dog bite(E906.0)   . Diverticulosis of colon (without mention of hemorrhage)   . Spinal stenosis, lumbar region, without neurogenic claudication   . Anxiety   . Panic attacks   . Asthma   . Irritable bowel syndrome   . Hiatal hernia   . Jaundice     Hx of Jaundice at age 59 from "dirty restuarant". Unsure of Hepatitis type   Past Surgical History  Procedure Laterality Date  . Vesicovaginal fistula closure w/ tah  1987  . Cardiac catheterization  2001  . Coronary angioplasty with stent placement  2001  . Cholecystectomy    . Bladder repair      tact  . Partial hymenectomy    . Breast lumpectomy      right  . Breast reconstruction      right breast  . Breast reduction surgery      left breast  . Abdominal hysterectomy      partial  . Colonoscopy    . Cataract extraction    . Esophageal manometry  10/08/2012    Procedure: ESOPHAGEAL MANOMETRY (EM);  Surgeon: Mardella Layman, MD;  Location: [facility];  Service: Endoscopy;  Laterality: N/A;   Family History  Problem Relation  Age of Onset  . Hypertension Mother   . Heart disease Mother   . Dementia Mother   . Arthritis Mother   . Diabetes Mother   . Pancreatic cancer Father   . Diabetes Father   . Hypertension Father   . Hypertension Sister   . Hypertension Brother   . Heart disease Sister   . Rectal cancer Neg Hx   . Stomach cancer Neg Hx   . Colon cancer Cousin    History  Substance Use Topics  . Smoking status: Former Smoker -- 0.30 packs/day for 8 years    Types: Cigarettes    Quit date: 09/13/1983  . Smokeless tobacco: Never Used  . Alcohol Use: No   OB History   Grav Para Term Preterm Abortions TAB SAB Ect Mult Living                 Review of Systems  Neurological: Positive for dizziness.  All other systems reviewed and are negative.    Allergies  Amoxicillin; Azithromycin; Bromfed; Cephalexin; Clidinium-chlordiazepoxide; Clotrimazole; Dicyclomine hcl; Hydralazine hcl; Ibuprofen; Iohexol; Latex; Lidocaine; Paroxetine; Penicillins; Propoxyphene-acetaminophen; Sertraline hcl; Sulfadiazine; and Verapamil  Home Medications   Current Outpatient Rx  Name  Route  Sig  Dispense  Refill  . ADVAIR DISKUS 100-50 MCG/DOSE AEPB               .  albuterol (PROVENTIL HFA;VENTOLIN HFA) 108 (90 BASE) MCG/ACT inhaler   Inhalation   Inhale 2 puffs into the lungs every 6 (six) hours as needed. For shortness of breath         . ALPRAZolam (XANAX) 1 MG tablet   Oral   Take 1 mg by mouth 3 (three) times daily as needed. 1/2 to 1 tablet 3 times daily as needed anxiety         . amLODipine (NORVASC) 5 MG tablet   Oral   Take 5 mg by mouth daily.         . Ascorbic Acid (VITAMIN C) 500 MG tablet   Oral   Take 500 mg by mouth daily.           Marland Kitchen aspirin 81 MG tablet   Oral   Take 81 mg by mouth daily.           . beclomethasone (QVAR) 40 MCG/ACT inhaler   Inhalation   Inhale 2 puffs into the lungs 2 (two) times daily.         . bisoprolol (ZEBETA) 10 MG tablet                . Calcium Carbonate-Vitamin D (CALCIUM-CARB 600 + D) 600-125 MG-UNIT TABS   Oral   Take 1 capsule by mouth daily.           . cetirizine (ZYRTEC ALLERGY) 10 MG tablet   Oral   Take 10 mg by mouth daily.         . cycloSPORINE (RESTASIS) 0.05 % ophthalmic emulsion   Both Eyes   Place 1 drop into both eyes 2 (two) times daily.           Marland Kitchen doxycycline (ADOXA) 50 MG tablet   Oral   Take 50 mg by mouth 2 (two) times daily.         . furosemide (LASIX) 40 MG tablet   Oral   Take 1 tablet (40 mg total) by mouth daily.   30 tablet   11   . metoprolol (LOPRESSOR) 50 MG tablet   Oral   Take 1 tablet (50 mg total) by mouth 2 (two) times daily.   180 tablet   3   . Multiple Vitamin (MULTIVITAMIN WITH MINERALS) TABS   Oral   Take 1 tablet by mouth daily.         . nitroGLYCERIN (NITROSTAT) 0.4 MG SL tablet   Sublingual   Place 1 tablet (0.4 mg total) under the tongue every 5 (five) minutes as needed for chest pain.   90 tablet   3   . OVER THE COUNTER MEDICATION   Oral   Take 1 capsule by mouth daily. [facility] probiotic digestive care: benificail bacteria, with b, infantis 10mg          . pantoprazole (PROTONIX) 40 MG tablet   Oral   Take 40 mg by mouth daily.         . potassium chloride SA (K-DUR,KLOR-CON) 20 MEQ tablet   Oral   Take 2 tablets (40 mEq total) by mouth daily.   60 tablet   5   . pravastatin (PRAVACHOL) 40 MG tablet   Oral   Take 40 mg by mouth daily.           . traMADol (ULTRAM) 50 MG tablet   Oral   Take 1 tablet (50 mg total) by mouth every 6 (six) hours as needed for pain.  50 tablet   0   . valsartan (DIOVAN) 160 MG tablet   Oral   Take 1 tablet (160 mg total) by mouth daily.         . vitamin E (VITAMIN E) 400 UNIT capsule   Oral   Take 400 Units by mouth daily.          BP 134/107  Pulse 104  Temp(Src) 98.3 F (36.8 C) (Oral)  Resp 18  SpO2 99% Physical Exam  Nursing note and vitals  reviewed. Constitutional: She appears well-developed and well-nourished.  HENT:  Head: Normocephalic and atraumatic.  Mucous membranes dry  Eyes: Conjunctivae are normal. Pupils are equal, round, and reactive to light.  Neck: Neck supple. No tracheal deviation present. No thyromegaly present.  Cardiovascular: Normal rate and regular rhythm.   No murmur heard. Pulmonary/Chest: Effort normal and breath sounds normal.  Abdominal: Soft. Bowel sounds are normal. She exhibits no distension. There is no tenderness.  Musculoskeletal: Normal range of motion. She exhibits no edema and no tenderness.  Neurological: She is alert. She has normal reflexes. Coordination normal.  gait normal moderate left lightheaded on standing  Skin: Skin is warm and dry. No rash noted.  Psychiatric: She has a normal mood and affect.    ED Course  Procedures (including critical care time) Labs Review Labs Reviewed - No data to display Imaging Review No results found.  EKG Interpretation   None       MDM  No diagnosis found. no further evaluation needed. Potassium chloride 40 mg by mouth prior to discharge Patient likely tries to one extra dose of diuretic. He is encouraged to drink lots of fluids. She can resume her usual medications today. Diagnosis #1accidental overdose #2 hypokalemia 3 hyperglycemia    Doug Sou, MD 06/28/13 1022  Doug Sou, MD 06/28/13 1103

## 2013-06-28 NOTE — ED Notes (Signed)
Pt accidentally took 2 days worth of BP meds (Metoprolol 50 mg) this morning. C/o feeling dizzy and light headed, contacted PCP and was told to come to the ED

## 2013-07-02 ENCOUNTER — Ambulatory Visit (HOSPITAL_BASED_OUTPATIENT_CLINIC_OR_DEPARTMENT_OTHER): Payer: Medicare Other | Admitting: Oncology

## 2013-07-02 ENCOUNTER — Other Ambulatory Visit (HOSPITAL_BASED_OUTPATIENT_CLINIC_OR_DEPARTMENT_OTHER): Payer: Medicare Other | Admitting: Lab

## 2013-07-02 ENCOUNTER — Encounter: Payer: Self-pay | Admitting: Oncology

## 2013-07-02 ENCOUNTER — Telehealth: Payer: Self-pay | Admitting: *Deleted

## 2013-07-02 ENCOUNTER — Telehealth: Payer: Self-pay | Admitting: Oncology

## 2013-07-02 VITALS — BP 155/93 | HR 84 | Temp 98.4°F | Resp 18 | Ht 64.0 in | Wt 175.7 lb

## 2013-07-02 DIAGNOSIS — Z853 Personal history of malignant neoplasm of breast: Secondary | ICD-10-CM

## 2013-07-02 LAB — CBC WITH DIFFERENTIAL/PLATELET
BASO%: 1.3 % (ref 0.0–2.0)
Basophils Absolute: 0 10*3/uL (ref 0.0–0.1)
EOS%: 3 % (ref 0.0–7.0)
HCT: 38.5 % (ref 34.8–46.6)
MCH: 30.3 pg (ref 25.1–34.0)
MCHC: 33.9 g/dL (ref 31.5–36.0)
MCV: 89.4 fL (ref 79.5–101.0)
MONO%: 7.6 % (ref 0.0–14.0)
RBC: 4.31 10*6/uL (ref 3.70–5.45)
RDW: 13.5 % (ref 11.2–14.5)
WBC: 3.2 10*3/uL — ABNORMAL LOW (ref 3.9–10.3)

## 2013-07-02 LAB — COMPREHENSIVE METABOLIC PANEL (CC13)
AST: 26 U/L (ref 5–34)
Albumin: 3.8 g/dL (ref 3.5–5.0)
Alkaline Phosphatase: 72 U/L (ref 40–150)
BUN: 10.9 mg/dL (ref 7.0–26.0)
CO2: 26 mEq/L (ref 22–29)
Calcium: 9.9 mg/dL (ref 8.4–10.4)
Potassium: 3.4 mEq/L — ABNORMAL LOW (ref 3.5–5.1)
Sodium: 144 mEq/L (ref 136–145)

## 2013-07-02 NOTE — Patient Instructions (Signed)
We will see you back in 6 months  Please call with any questions or problems

## 2013-07-02 NOTE — Telephone Encounter (Signed)
, °

## 2013-07-02 NOTE — Telephone Encounter (Signed)
Message copied by Cooper Render on Tue Jul 02, 2013  4:28 PM ------      Message from: Illa Level      Created: Tue Jul 02, 2013  4:15 PM       Patients potassium is low, please call patient and tell to increase intake of potassium rich foods.             Thanks,       L      ----- Message -----         From: [facility] Interface         Sent: 07/02/2013   9:49 AM           To: Illa Level, NP                   ------

## 2013-07-02 NOTE — Progress Notes (Signed)
OFFICE PROGRESS NOTE  CC**  Linda Bison, MD [address] [place] [address]  DIAGNOSIS: 62 y/o female with stage IIA right breast cancer ER negative, PR negative, HER-2/neu negative.  PRIOR THERAPY:  1.  Patient was found to have a right breast mass at a routine gynecologic breast exam in 08/2007. She had a diagnostic mammogram on 08/29/2007 that found to macrolobulated masses 1.5 x 1.2 x 1.8 cm and 1.2 x 1.4 x. 1.7 cm.  The medial mass biopsy showed DCIS with foci suspicious for stromal invasion, the lateral mass showed invasive ductal carcinoma ER 1%, PR 0%, HER-2/neu negative with a Ki-67 of 38%.  MRI on 09/07/2007 showed a bilobed mass in the right upper inner quadrant of 4.9 x 2.6 x 4.8 cm with no axillary adenopathy.  2. Patient underwent neoadjuvant treatment with four cycles of dose dense FEC followed by 4 cycles of dose dense Taxotere coupled with Xeloda 1000mg  PO BID daily for 8 weeks.  There was an effort to increase her Xeloda to 1500mg  however she was hospitalized for febrile neutropenia.  She had f/u MRI on 03/23/08 that showed response to neoadjuvant chemotherapy with the mass decreasing to 3.4 x 2.6 x 3 cm.    3. She had a partial mastectomy by Dr. Derrell Lolling on 05/06/08 and a 3.8 cm tumor was removed.  Sentinel lymph node was negative.  4. She underwent radiation therapy with Dr. Mitzi Hansen from 06/03/08 to 07/22/08.    5. 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  A PET/CT in January 2010 was negative for any malignancy or cancer, and a lumbar puncture with cytology was negative as well.  She is followed by Dr. Marca Ancona in the pain clinic for her back.   CURRENT THERAPY: Observation  INTERVAL HISTORY: [name] 62 y.o. female returns for follow up.  She is doing well.  She has not noticed any changes in her breasts, or any other difficulties.  She denies fevers,  chills, nausea, vomiting, pain, or any other concerns.  She does have some swelling and is taking Lasix as directed by her PCP and has an echocardiogram scheduled as well as f/u with a cardiologist.    MEDICAL HISTORY: Past Medical History  Diagnosis Date  . Myocardial infarct 2005  . CAD (coronary artery disease)   . Hypertension   . Long term (current) use of anticoagulants   . Edema   . Hematoma   . Sinusitis acute   . Adenocarcinoma of breast     right  . Acute upper respiratory infections of unspecified site   . Depression   . Anal fissure   . GERD (gastroesophageal reflux disease)   . Dog bite(E906.0)   . Diverticulosis of colon (without mention of hemorrhage)   . Spinal stenosis, lumbar region, without neurogenic claudication   . Anxiety   . Panic attacks   . Asthma   . Irritable bowel syndrome   . Hiatal hernia   . Jaundice     Hx of Jaundice at age 26 from "dirty restuarant". Unsure of Hepatitis type    ALLERGIES:  is allergic to amoxicillin; azithromycin; bromfed; cephalexin; chlordiazepoxide-clidinium; clotrimazole; dicyclomine hcl; hydralazine hcl; ibuprofen; iohexol; latex; lidocaine; paroxetine; penicillins; propoxyphene-acetaminophen; sertraline hcl; sulfadiazine; and verapamil.  MEDICATIONS:  Current Outpatient Prescriptions  Medication Sig Dispense Refill  . acetaminophen (TYLENOL) 500 MG tablet Take 500 mg by mouth 3 (  three) times daily.      Marland Kitchen ADVAIR DISKUS 100-50 MCG/DOSE AEPB Inhale 1 puff into the lungs 2 (two) times daily as needed (shortness of breath).       Marland Kitchen albuterol (PROVENTIL HFA;VENTOLIN HFA) 108 (90 BASE) MCG/ACT inhaler Inhale 2 puffs into the lungs every 6 (six) hours as needed. For shortness of breath      . ALPRAZolam (XANAX) 1 MG tablet Take 1 mg by mouth 2 (two) times daily as needed for sleep or anxiety. 1/2 to 1 tablet 2 times daily as needed anxiety/sleep      . amLODipine (NORVASC) 5 MG tablet       . ascorbic acid (VITAMIN C) 1000 MG  tablet Take 1,000 mg by mouth daily.      Marland Kitchen aspirin 81 MG tablet Take 81 mg by mouth daily.        . Calcium Carbonate-Vitamin D (CALCIUM-CARB 600 + D) 600-125 MG-UNIT TABS Take 1 capsule by mouth 2 (two) times daily.       . cetirizine (ZYRTEC ALLERGY) 10 MG tablet Take 10 mg by mouth daily.      . cyclobenzaprine (FLEXERIL) 5 MG tablet       . cycloSPORINE (RESTASIS) 0.05 % ophthalmic emulsion Place 1 drop into both eyes 2 (two) times daily.        Marland Kitchen doxycycline (VIBRAMYCIN) 50 MG capsule       . furosemide (LASIX) 40 MG tablet Take 1 tablet (40 mg total) by mouth daily.  30 tablet  11  . metoprolol (LOPRESSOR) 50 MG tablet Take 1 tablet (50 mg total) by mouth 2 (two) times daily.  180 tablet  3  . nitroGLYCERIN (NITROSTAT) 0.4 MG SL tablet Place 1 tablet (0.4 mg total) under the tongue every 5 (five) minutes as needed for chest pain.  90 tablet  3  . pantoprazole (PROTONIX) 40 MG tablet Take 40 mg by mouth daily.      . pravastatin (PRAVACHOL) 40 MG tablet       . valsartan (DIOVAN) 160 MG tablet Take 160 mg by mouth 2 (two) times daily.       . vitamin E (VITAMIN E) 400 UNIT capsule Take 400 Units by mouth daily.      . VOLTAREN 1 % GEL        No current facility-administered medications for this visit.    SURGICAL HISTORY:  Past Surgical History  Procedure Laterality Date  . Vesicovaginal fistula closure w/ tah  1987  . Cardiac catheterization  2001  . Coronary angioplasty with stent placement  2001  . Cholecystectomy    . Bladder repair      tact  . Partial hymenectomy    . Breast lumpectomy      right  . Breast reconstruction      right breast  . Breast reduction surgery      left breast  . Abdominal hysterectomy      partial  . Colonoscopy    . Cataract extraction    . Esophageal manometry  10/08/2012    Procedure: ESOPHAGEAL MANOMETRY (EM);  Surgeon: Mardella Layman, MD;  Location: [facility];  Service: Endoscopy;  Laterality: N/A;    REVIEW OF SYSTEMS:   General: fatigue (-), night sweats (-), fever (-), pain (-) Lymph: palpable nodes (-) HEENT: vision changes (-), mucositis (-), gum bleeding (-), epistaxis (-) Cardiovascular: chest pain (-), palpitations (-) Pulmonary: shortness of breath (-), dyspnea on exertion (-), cough (-), hemoptysis (-)  GI:  Early satiety (-), melena (-), dysphagia (-), nausea/vomiting (-), diarrhea (-) GU: dysuria (-), hematuria (-), incontinence (-) Musculoskeletal: joint swelling (-), joint pain (-), back pain (-) Neuro: weakness (-), numbness (-), headache (-), confusion (-) Skin: Rash (-), lesions (-), dryness (-) Psych: depression (-), suicidal/homicidal ideation (-), feeling of hopelessness (-)   HEALTH MAINTENANCE:  Mammogram 01/2012 Colonoscopy 04/2012 Bone Density n/a Pap Smear 05/2012 Eye Exam 09/2012 Vitamin D n/a Lipid Panel not checked recently  PHYSICAL EXAMINATION: Blood pressure 155/93, pulse 84, temperature 98.4 F (36.9 C), temperature source Oral, resp. rate 18, height 5\' 4"  (1.626 m), weight 175 lb 11.2 oz (79.697 kg). Body mass index is 30.14 kg/(m^2). General: Patient is a well appearing female in no acute distress HEENT: PERRLA, sclerae anicteric no conjunctival pallor, MMM Neck: supple, no palpable adenopathy Lungs: clear to auscultation bilaterally, no wheezes, rhonchi, or rales Cardiovascular: regular rate rhythm, S1, S2, no murmurs, rubs or gallops Abdomen: Soft, non-tender, non-distended, normoactive bowel sounds, no HSM Extremities: warm and well perfused, no clubbing, cyanosis, or edema Skin: No rashes or lesions Neuro: Non-focal Breasts: Right breast lumpectomy site well healed, no nodularity or masses, left breast no nodularity, masses, or skin changes ECOG PERFORMANCE STATUS: 1 - Symptomatic but completely ambulatory      LABORATORY DATA: Lab Results  Component Value Date   WBC 3.2* 07/02/2013   HGB 13.0 07/02/2013   HCT 38.5 07/02/2013   MCV 89.4 07/02/2013    PLT 178 07/02/2013      Chemistry      Component Value Date/Time   NA 143 06/28/2013 1037   NA 145 12/07/2012 1341   K 3.4* 06/28/2013 1037   K 3.7 12/07/2012 1341   CL 102 06/28/2013 1037   CL 104 12/07/2012 1341   CO2 29 01/02/2013 0810   CO2 32* 12/07/2012 1341   BUN 15 06/28/2013 1037   BUN 14.0 12/07/2012 1341   CREATININE 1.10 06/28/2013 1037   CREATININE 1.1 12/07/2012 1341      Component Value Date/Time   CALCIUM 9.5 01/02/2013 0810   CALCIUM 10.0 12/07/2012 1341   ALKPHOS 64 01/02/2013 0810   ALKPHOS 83 12/07/2012 1341   AST 25 01/02/2013 0810   AST 22 12/07/2012 1341   ALT 27 01/02/2013 0810   ALT 25 12/07/2012 1341   BILITOT 0.7 01/02/2013 0810   BILITOT 0.77 12/07/2012 1341       RADIOGRAPHIC STUDIES:  No results found.  ASSESSMENT:  [name] is a 62 y/o female  1. Stage IIA triple negative breast cancer of the right breast.   PLAN:  1.  We will see the patient back in every six months.  We updated her health maintenance information, discussed the importance of monthly self breast exams, yearly diagnostic mammograms, exercise, healthy diet, and calcium/vitamin D supplementation.    2. Patient daughter diagnosed with cancer during her malignancy we had a lengthy discussion regarding the situation. Patient encouraged to continue counseling with Dr. Noe Gens   All questions were answered. The patient knows to call the clinic with any problems, questions or concerns. We can certainly see the patient much sooner if necessary.  I spent 30 minutes counseling the patient face to face. The total time spent in the appointment was 30 minutes.  Drue Second, MD Medical/Oncology [facility] [phone] (beeper) [phone] (Office)  07/02/2013, 10:23 AM

## 2013-07-02 NOTE — Telephone Encounter (Signed)
Notified pt, per NP increase potassium rich foods. Gave pt's examples of K+ rich foods. Pt verbalized understanding. No further concerns.

## 2013-07-04 ENCOUNTER — Ambulatory Visit (INDEPENDENT_AMBULATORY_CARE_PROVIDER_SITE_OTHER): Payer: Medicare Other | Admitting: Psychiatry

## 2013-07-04 DIAGNOSIS — F0630 Mood disorder due to known physiological condition, unspecified: Secondary | ICD-10-CM

## 2013-07-04 DIAGNOSIS — Z630 Problems in relationship with spouse or partner: Secondary | ICD-10-CM | POA: Diagnosis not present

## 2013-07-10 DIAGNOSIS — I10 Essential (primary) hypertension: Secondary | ICD-10-CM | POA: Diagnosis not present

## 2013-07-10 DIAGNOSIS — R7309 Other abnormal glucose: Secondary | ICD-10-CM | POA: Diagnosis not present

## 2013-07-10 DIAGNOSIS — Z789 Other specified health status: Secondary | ICD-10-CM | POA: Diagnosis not present

## 2013-07-11 ENCOUNTER — Ambulatory Visit (INDEPENDENT_AMBULATORY_CARE_PROVIDER_SITE_OTHER): Payer: Medicare Other | Admitting: Psychiatry

## 2013-07-11 DIAGNOSIS — Z630 Problems in relationship with spouse or partner: Secondary | ICD-10-CM

## 2013-07-11 DIAGNOSIS — F4323 Adjustment disorder with mixed anxiety and depressed mood: Secondary | ICD-10-CM | POA: Diagnosis not present

## 2013-07-16 ENCOUNTER — Telehealth: Payer: Self-pay | Admitting: Gastroenterology

## 2013-07-16 NOTE — Telephone Encounter (Signed)
Pt seen in July, 2014 by Mike Gip, PA for abdominal pain and bloating. Labs were done and CT was ordered that was unremarkable.  Today, pt reports her stomach swells after she eats no matter what the food and now her feet and legs are swelling. She is wearing support hose to help with the swelling and her BP meds have been changed. Tried to schedule pt an appt and she is [place] [facility] and I cannot make appts now until I have a referral and NPI code. Pt will call back.

## 2013-07-18 ENCOUNTER — Other Ambulatory Visit: Payer: Self-pay

## 2013-07-18 ENCOUNTER — Ambulatory Visit (INDEPENDENT_AMBULATORY_CARE_PROVIDER_SITE_OTHER): Payer: Medicare Other | Admitting: Psychiatry

## 2013-07-18 DIAGNOSIS — Z630 Problems in relationship with spouse or partner: Secondary | ICD-10-CM | POA: Diagnosis not present

## 2013-07-18 DIAGNOSIS — F4323 Adjustment disorder with mixed anxiety and depressed mood: Secondary | ICD-10-CM | POA: Diagnosis not present

## 2013-07-18 NOTE — Telephone Encounter (Signed)
Sch'd to see [name] on 07-19-13. Pt's aware

## 2013-07-19 ENCOUNTER — Ambulatory Visit (INDEPENDENT_AMBULATORY_CARE_PROVIDER_SITE_OTHER): Payer: Medicare Other | Admitting: Gastroenterology

## 2013-07-19 ENCOUNTER — Encounter: Payer: Self-pay | Admitting: Gastroenterology

## 2013-07-19 ENCOUNTER — Other Ambulatory Visit (INDEPENDENT_AMBULATORY_CARE_PROVIDER_SITE_OTHER): Payer: Medicare Other

## 2013-07-19 VITALS — BP 138/90 | HR 80 | Wt 175.2 lb

## 2013-07-19 DIAGNOSIS — K589 Irritable bowel syndrome without diarrhea: Secondary | ICD-10-CM

## 2013-07-19 DIAGNOSIS — R140 Abdominal distension (gaseous): Secondary | ICD-10-CM

## 2013-07-19 DIAGNOSIS — R141 Gas pain: Secondary | ICD-10-CM

## 2013-07-19 LAB — IGA: IgA: 221 mg/dL (ref 68–378)

## 2013-07-19 NOTE — Patient Instructions (Signed)
Please go to the basement level to have your labs drawn.  We made you a follow up visit with Dr. Jarold Motto for 08-16-2013 at 9:15 am.  Follow the Fod Map diet , we have given you a sheet on this.

## 2013-07-19 NOTE — Progress Notes (Signed)
07/19/2013 [name] [id number] 27-Jan-1951   History of Present Illness:  [name] is a 62 year old white female known to Dr. Jarold Motto. She has history of hypertension, coronary artery disease is status post MI in 2005. She also has history of asthma, hyperlipidemia, and breast cancer, which was diagnosed in 2007. She is status post lumpectomy and underwent chemotherapy and radiation, and says she has been disease free.  She also has anxiety and bipolar disorder with panic attacks.  She had undergone colonoscopy in August of 2013, which showed moderate diverticulosis of the left colon. She had EGD in December of 2013 which was a normal exam and then underwent esophageal manometry which was also normal. Upper abnormal ultrasound was also done in December 2013 which was negative status post cholecystectomy.  CT scan of the abdomen and pelvis without contrast in July of this year was unremarkable as well.  Recent CBC and CMP were unremarkable.    She comes in today with the same previous complaints of abdominal bloating (swelling as she refers to it) and distention along with diffuse abdominal pain that she has complained about in the past.  Has alternating constipation and diarrhea.  Has diarrhea following a harder stool.  Says that she gets symptoms even after eating a banana.  Says that she had a black stool this AM.     Current Medications, Allergies, Past Medical History, Past Surgical History, Family History and Social History were reviewed in [employer] record.   Physical Exam: BP 138/90  Pulse 80  Wt 175 lb 3.2 oz (79.47 kg) General: Well developed, white female in no acute distress Head: Normocephalic and atraumatic Eyes:  Sclerae anicteric, conjunctiva pink  Ears: Normal auditory acuity Lungs: Clear throughout to auscultation Heart: Regular rate and rhythm Abdomen: Soft, non-distended.  Normal bowel sounds.  Diffuse TTP with light palpation, but abdomen is benign.    Rectal:  There was soft stool noted in the rectal vault.  Stool was very light brown and heme negative.   Musculoskeletal: Symmetrical with no gross deformities  Extremities: No edema  Neurological: Alert oriented x 4, grossly nonfocal Psychological:  Alert and cooperative. Normal mood and affect  Assessment and Recommendations: -Postprandial abdominal bloating, swelling, and generalized abdominal pain.  Also alternating constipation and diarrhea.  All symptoms present for at least the past year.  Very likely secondary to IBS.  Will check labs to rule out celiac disease.  Continue daily probiotic.  Will give the FODMAP diet for her to follow.  Will follow up in 4 weeks.

## 2013-07-19 NOTE — Progress Notes (Signed)
I agree

## 2013-07-22 DIAGNOSIS — F411 Generalized anxiety disorder: Secondary | ICD-10-CM | POA: Diagnosis not present

## 2013-07-25 ENCOUNTER — Ambulatory Visit: Payer: Federal, State, Local not specified - Other | Admitting: Psychiatry

## 2013-07-28 ENCOUNTER — Encounter (HOSPITAL_COMMUNITY): Payer: Self-pay | Admitting: Emergency Medicine

## 2013-07-28 ENCOUNTER — Emergency Department (HOSPITAL_COMMUNITY): Payer: Medicare Other

## 2013-07-28 ENCOUNTER — Emergency Department (HOSPITAL_COMMUNITY)
Admission: EM | Admit: 2013-07-28 | Discharge: 2013-07-28 | Disposition: A | Discharge status: Home / Self Care | Payer: Medicare Other | Attending: Emergency Medicine | Admitting: Emergency Medicine

## 2013-07-28 DIAGNOSIS — Z880 Allergy status to penicillin: Secondary | ICD-10-CM | POA: Insufficient documentation

## 2013-07-28 DIAGNOSIS — Z9861 Coronary angioplasty status: Secondary | ICD-10-CM | POA: Insufficient documentation

## 2013-07-28 DIAGNOSIS — I10 Essential (primary) hypertension: Secondary | ICD-10-CM | POA: Diagnosis not present

## 2013-07-28 DIAGNOSIS — Z8719 Personal history of other diseases of the digestive system: Secondary | ICD-10-CM | POA: Diagnosis not present

## 2013-07-28 DIAGNOSIS — F329 Major depressive disorder, single episode, unspecified: Secondary | ICD-10-CM | POA: Insufficient documentation

## 2013-07-28 DIAGNOSIS — J45909 Unspecified asthma, uncomplicated: Secondary | ICD-10-CM | POA: Diagnosis not present

## 2013-07-28 DIAGNOSIS — F410 Panic disorder [episodic paroxysmal anxiety] without agoraphobia: Secondary | ICD-10-CM | POA: Insufficient documentation

## 2013-07-28 DIAGNOSIS — Z9889 Other specified postprocedural states: Secondary | ICD-10-CM | POA: Diagnosis not present

## 2013-07-28 DIAGNOSIS — Z853 Personal history of malignant neoplasm of breast: Secondary | ICD-10-CM | POA: Diagnosis not present

## 2013-07-28 DIAGNOSIS — Z87891 Personal history of nicotine dependence: Secondary | ICD-10-CM | POA: Insufficient documentation

## 2013-07-28 DIAGNOSIS — Z91040 Latex allergy status: Secondary | ICD-10-CM | POA: Diagnosis not present

## 2013-07-28 DIAGNOSIS — Z7982 Long term (current) use of aspirin: Secondary | ICD-10-CM | POA: Insufficient documentation

## 2013-07-28 DIAGNOSIS — G8929 Other chronic pain: Secondary | ICD-10-CM | POA: Insufficient documentation

## 2013-07-28 DIAGNOSIS — IMO0002 Reserved for concepts with insufficient information to code with codable children: Secondary | ICD-10-CM | POA: Diagnosis not present

## 2013-07-28 DIAGNOSIS — I252 Old myocardial infarction: Secondary | ICD-10-CM | POA: Diagnosis not present

## 2013-07-28 DIAGNOSIS — I2510 Atherosclerotic heart disease of native coronary artery without angina pectoris: Secondary | ICD-10-CM | POA: Diagnosis not present

## 2013-07-28 DIAGNOSIS — Z7901 Long term (current) use of anticoagulants: Secondary | ICD-10-CM | POA: Diagnosis not present

## 2013-07-28 DIAGNOSIS — Z79899 Other long term (current) drug therapy: Secondary | ICD-10-CM | POA: Diagnosis not present

## 2013-07-28 DIAGNOSIS — F3289 Other specified depressive episodes: Secondary | ICD-10-CM | POA: Insufficient documentation

## 2013-07-28 DIAGNOSIS — M5416 Radiculopathy, lumbar region: Secondary | ICD-10-CM

## 2013-07-28 DIAGNOSIS — M519 Unspecified thoracic, thoracolumbar and lumbosacral intervertebral disc disorder: Secondary | ICD-10-CM | POA: Diagnosis not present

## 2013-07-28 LAB — POCT I-STAT CREATININE: Creatinine, Ser: 1.3 mg/dL — ABNORMAL HIGH (ref 0.50–1.10)

## 2013-07-28 MED ORDER — DEXAMETHASONE SODIUM PHOSPHATE 10 MG/ML IJ SOLN
10.0000 mg | Freq: Once | INTRAMUSCULAR | Status: AC
  Administered 2013-07-28: 10 mg via INTRAVENOUS
  Filled 2013-07-28: qty 1

## 2013-07-28 MED ORDER — TRAMADOL HCL 50 MG PO TABS: 50.0000 mg | ORAL_TABLET | Freq: Four times a day (QID) | ORAL | Status: DC | PRN

## 2013-07-28 MED ORDER — METHOCARBAMOL 100 MG/ML IJ SOLN
500.0000 mg | Freq: Once | INTRAMUSCULAR | Status: AC
  Administered 2013-07-28: 500 mg via INTRAMUSCULAR
  Filled 2013-07-28: qty 5

## 2013-07-28 MED ORDER — METHOCARBAMOL 500 MG PO TABS: 1000.0000 mg | ORAL_TABLET | Freq: Four times a day (QID) | ORAL | Status: DC | PRN

## 2013-07-28 MED ORDER — TRAMADOL HCL 50 MG PO TABS
100.0000 mg | ORAL_TABLET | Freq: Once | ORAL | Status: AC
  Administered 2013-07-28: 100 mg via ORAL
  Filled 2013-07-28: qty 2

## 2013-07-28 NOTE — ED Notes (Signed)
Pt c/o bain pain x3 months

## 2013-07-28 NOTE — ED Notes (Signed)
Patient transported to MRI 

## 2013-07-28 NOTE — ED Provider Notes (Signed)
CSN: [id number]     Arrival date & time 07/28/13  1314 History   First MD Initiated Contact with Patient 07/28/13 1352     Chief Complaint  Patient presents with  . Back Pain   (Consider location/radiation/quality/duration/timing/severity/associated sxs/prior Treatment) HPI   [name] is a 62 y.o. female complaining of exacerbation of chronic low back pain radiating down the left leg to the foot. Pain is significantly worsening over the course of the last 2 weeks in addition has now become bilateral with right-sided pain as well. Patient has been taking Tylenol Extra Strength, applying Voltaren gel with little relief. She states the pain is 10 out of 10, it is exacerbated by lying flat. She denies any fever, numbness, weakness, history of IV drug use. Patient is a breast cancer survivor. She does report change in her bowel habits with intermittent urinary incontinence described as urge incontinence starting 1.5 weeks ago. Patient denies any saddle paresthesia. Last MRI was 3 years ago.  [facility]t [facility]   Past Medical History  Diagnosis Date  . Myocardial infarct 2005  . CAD (coronary artery disease)   . Hypertension   . Long term (current) use of anticoagulants   . Edema   . Hematoma   . Sinusitis acute   . Adenocarcinoma of breast     right  . Acute upper respiratory infections of unspecified site   . Depression   . Anal fissure   . GERD (gastroesophageal reflux disease)   . Dog bite(E906.0)   . Diverticulosis of colon (without mention of hemorrhage)   . Spinal stenosis, lumbar region, without neurogenic claudication   . Anxiety   . Panic attacks   . Asthma   . Irritable bowel syndrome   . Hiatal hernia   . Jaundice     Hx of Jaundice at age 49 from "dirty restuarant". Unsure of Hepatitis type   Past Surgical History  Procedure Laterality Date  . Partial hysterectomy  1987  . Cardiac catheterization  2001  . Coronary angioplasty with stent placement   2001  . Cholecystectomy    . Bladder repair      tact  . Breast lumpectomy Right   . Breast reconstruction Right   . Breast reduction surgery Left   . Colonoscopy    . Cataract extraction    . Esophageal manometry  10/08/2012    Procedure: ESOPHAGEAL MANOMETRY (EM);  Surgeon: Mardella Layman, MD;  Location: [facility];  Service: Endoscopy;  Laterality: N/A;   Family History  Problem Relation Age of Onset  . Hypertension Mother   . Heart disease Mother   . Dementia Mother   . Arthritis Mother   . Diabetes Mother   . Prostate cancer Father   . Lung cancer Maternal Uncle   . Hypertension Father   . Hypertension Sister   . Hypertension Brother   . Heart disease Sister   . Rectal cancer Neg Hx   . Stomach cancer Neg Hx   . Colon cancer Cousin   . Colon polyps Mother   . Colon polyps Father   . Inflammatory bowel disease Sister    History  Substance Use Topics  . Smoking status: Former Smoker -- 0.30 packs/day for 8 years    Types: Cigarettes    Quit date: 09/13/1983  . Smokeless tobacco: Never Used  . Alcohol Use: No   OB History   Grav Para Term Preterm Abortions TAB SAB Ect Mult Living  Review of Systems 10 systems reviewed and found to be negative, except as noted in the HPI   Allergies  Amoxicillin; Azithromycin; Bromfed; Cephalexin; Chlordiazepoxide-clidinium; Clotrimazole; Dicyclomine hcl; Hydralazine hcl; Ibuprofen; Iohexol; Latex; Lidocaine; Paroxetine; Penicillins; Propoxyphene-acetaminophen; Sertraline hcl; Sulfadiazine; and Verapamil  Home Medications   Current Outpatient Rx  Name  Route  Sig  Dispense  Refill  . acetaminophen (TYLENOL) 500 MG tablet   Oral   Take 500 mg by mouth every 8 (eight) hours as needed (pain).          Marland Kitchen albuterol (PROVENTIL HFA;VENTOLIN HFA) 108 (90 BASE) MCG/ACT inhaler   Inhalation   Inhale 2 puffs into the lungs every 6 (six) hours as needed. For shortness of breath         . ALPRAZolam (XANAX) 1  MG tablet   Oral   Take 0.5-1 mg by mouth 2 (two) times daily as needed for anxiety or sleep.          Marland Kitchen ascorbic acid (VITAMIN C) 1000 MG tablet   Oral   Take 1,000 mg by mouth daily.         Marland Kitchen aspirin 81 MG tablet   Oral   Take 81 mg by mouth daily.           . busPIRone (BUSPAR) 10 MG tablet   Oral   Take 10 mg by mouth 2 (two) times daily.          . Calcium Carbonate-Vitamin D (CALCIUM-CARB 600 + D) 600-125 MG-UNIT TABS   Oral   Take 1 capsule by mouth 2 (two) times daily.          . Camphor-Menthol-Methyl Sal (SALONPAS) 1.2-5.7-6.3 % PTCH   Apply externally   Apply 3 patches topically daily. Removes patches at night         . cetirizine (ZYRTEC ALLERGY) 10 MG tablet   Oral   Take 10 mg by mouth daily.         . cycloSPORINE (RESTASIS) 0.05 % ophthalmic emulsion   Both Eyes   Place 1 drop into both eyes 2 (two) times daily.           . furosemide (LASIX) 40 MG tablet   Oral   Take 1 tablet (40 mg total) by mouth daily.   30 tablet   11   . metoprolol (LOPRESSOR) 50 MG tablet   Oral   Take 1 tablet (50 mg total) by mouth 2 (two) times daily.   180 tablet   3   . nitroGLYCERIN (NITROSTAT) 0.4 MG SL tablet   Sublingual   Place 1 tablet (0.4 mg total) under the tongue every 5 (five) minutes as needed for chest pain.   90 tablet   3   . pravastatin (PRAVACHOL) 40 MG tablet   Oral   Take 40 mg by mouth every evening.          . Probiotic Product (PROBIOTIC DAILY) CAPS   Oral   Take 1 capsule by mouth daily.         . sodium chloride (OCEAN) 0.65 % nasal spray   Nasal   Place 1 spray into the nose as needed for congestion.         . valsartan (DIOVAN) 160 MG tablet   Oral   Take 160 mg by mouth 2 (two) times daily.          . vitamin E (VITAMIN E) 400 UNIT capsule   Oral   Take  400 Units by mouth daily.         . VOLTAREN 1 % GEL   Topical   Apply 2 g topically 2 (two) times daily as needed (pain).          .  methocarbamol (ROBAXIN) 500 MG tablet   Oral   Take 2 tablets (1,000 mg total) by mouth 4 (four) times daily as needed (Pain).   20 tablet   0   . traMADol (ULTRAM) 50 MG tablet   Oral   Take 1 tablet (50 mg total) by mouth every 6 (six) hours as needed.   15 tablet   0    BP 141/87  Pulse 72  Temp(Src) 98.7 F (37.1 C) (Oral)  Resp 14  SpO2 98% Physical Exam  Nursing note and vitals reviewed. Constitutional: She is oriented to person, place, and time. She appears well-developed and well-nourished. No distress.  HENT:  Head: Normocephalic.  Mouth/Throat: Oropharynx is clear and moist.  Eyes: Conjunctivae and EOM are normal.  Cardiovascular: Normal rate, regular rhythm and intact distal pulses.   Pulmonary/Chest: Effort normal and breath sounds normal. No stridor. No respiratory distress. She has no wheezes. She has no rales. She exhibits no tenderness.  Abdominal: Soft. There is no tenderness. There is no rebound and no guarding.  Musculoskeletal: Normal range of motion.  No point tenderness to percussion of lumbar spinal processes.  No TTP or paraspinal spasm. Strength is 5 out of 5 to bilateral lower extremities at hip and knee;extensor hallucis longus 5 out of 5. Ankle strength 5 out of 5, no clonus, neurovascularly intact.  No saddle anaesthesia    Strait leg raise is positive bilaterally.    Neurological: She is alert and oriented to person, place, and time.  Psychiatric: She has a normal mood and affect.    ED Course  Procedures (including critical care time) Labs Review Labs Reviewed  POCT I-STAT CREATININE - Abnormal; Notable for the following:    Creatinine, Ser 1.30 (*)    All other components within normal limits   Imaging Review Mr Lumbar Spine Wo Contrast  07/28/2013   CLINICAL DATA:  Low back and bilateral leg pain. Urinary incontinence.  EXAM: MRI LUMBAR SPINE WITHOUT CONTRAST  TECHNIQUE: Multiplanar, multisequence MR imaging was performed. No  intravenous contrast was administered.  COMPARISON:  MRI dated 03/14/2012  FINDINGS: Slightly low-lying but otherwise normal conus tip at L2-3. Normal paraspinal soft tissues.  T12-L1 through L2-3:  Normal.  L3-4: Tiny diffuse disc bulge with no neural impingement, unchanged.  L4-5: Tiny disc bulges into the neural foramina bilaterally, unchanged. Small effusions in the facet joints, also unchanged. No neural impingement.  L5-S1: Small broad-based disc bulge with a small annular tear at the level of the left neural foramen, unchanged. Disc bulges into both neural foramina without neural impingement, unchanged. Minimal degenerative changes of the facet joints, stable.  IMPRESSION: 1. No significant change in the appearance of the lumbar spine since the prior study of 03/14/2012. 2. No neural impingement or spinal stenosis. 3. Small annular tear at the level of the left neural foramen at L5-S1.   Electronically Signed   By: Geanie Cooley M.D.   On: 07/28/2013 17:58    EKG Interpretation   None      Patient seen and examined at the bedside. She reports that after Decadron and Robaxin at the pain in the legs is resolved however she reports low back pain at 8/10.   MDM  1. Chronic lumbar radiculopathy      Filed Vitals:   07/28/13 1324 07/28/13 1852  BP: 159/98 141/87  Pulse: 78 72  Temp: 98.3 F (36.8 C) 98.7 F (37.1 C)  TempSrc: Oral Oral  Resp: 14   SpO2: 99% 98%     [name] is a 62 y.o. female with past medical history significant for breast cancer in full remission complaining of exacerbation of chronic low back pain. She reports a unrinary incontinence which she describes as consistent with urge incontinence starting 1.5 weeks ago. In addition to this she is having bilateral radiculopathy which also started approximately the same time. In consideration of the acute exacerbation of low back pain, new urinary incontinence and I doubt bilateral nature of the radiculopathy I think  it is reasonable to obtain an MRI at this point. Pain control medications are limited based on patient's preference to not have narcotics (because she has family members who are predicted), and significant number of the drug allergies.  MRI shows no acute abnormalities. Patient will be discharged home with Robaxin and tramadol.  Medications  methocarbamol (ROBAXIN) injection 500 mg (500 mg Intramuscular Given 07/28/13 1451)  dexamethasone (DECADRON) injection 10 mg (10 mg Intravenous Given 07/28/13 1450)  traMADol (ULTRAM) tablet 100 mg (100 mg Oral Given 07/28/13 1552)    Pt is hemodynamically stable, appropriate for, and amenable to discharge at this time. Pt verbalized understanding and agrees with care plan. All questions answered. Outpatient follow-up and specific return precautions discussed.    Discharge Medication List as of 07/28/2013  6:07 PM    START taking these medications   Details  methocarbamol (ROBAXIN) 500 MG tablet Take 2 tablets (1,000 mg total) by mouth 4 (four) times daily as needed (Pain)., Starting 07/28/2013, Until Discontinued, Print    traMADol (ULTRAM) 50 MG tablet Take 1 tablet (50 mg total) by mouth every 6 (six) hours as needed., Starting 07/28/2013, Until Discontinued, Print        Note: Portions of this report may have been transcribed using voice recognition software. Every effort was made to ensure accuracy; however, inadvertent computerized transcription errors may be present      Wynetta Emery, PA-C 07/29/13 0008

## 2013-07-28 NOTE — ED Notes (Signed)
Pt. Ambulated no no assist. Pt. Walked from bed out to the hallway. Pt. Denies dizziness. Nurse was notified.

## 2013-07-29 ENCOUNTER — Telehealth: Payer: Self-pay | Admitting: Gastroenterology

## 2013-07-29 NOTE — Telephone Encounter (Signed)
Pt went to the ER yesterday for back and leg pain. She mad a mistake and thought she was to f/u with Dr Jarold Motto, but it's her PCP. She will see [place] on 08/16/13.

## 2013-07-30 ENCOUNTER — Ambulatory Visit (INDEPENDENT_AMBULATORY_CARE_PROVIDER_SITE_OTHER): Payer: Medicare Other | Admitting: Psychiatry

## 2013-07-30 DIAGNOSIS — F4323 Adjustment disorder with mixed anxiety and depressed mood: Secondary | ICD-10-CM

## 2013-07-30 DIAGNOSIS — R52 Pain, unspecified: Secondary | ICD-10-CM | POA: Diagnosis not present

## 2013-07-30 DIAGNOSIS — I10 Essential (primary) hypertension: Secondary | ICD-10-CM | POA: Diagnosis not present

## 2013-07-30 DIAGNOSIS — Z7189 Other specified counseling: Secondary | ICD-10-CM

## 2013-07-30 DIAGNOSIS — G8929 Other chronic pain: Secondary | ICD-10-CM | POA: Diagnosis not present

## 2013-07-30 DIAGNOSIS — Z23 Encounter for immunization: Secondary | ICD-10-CM | POA: Diagnosis not present

## 2013-07-30 DIAGNOSIS — M545 Low back pain, unspecified: Secondary | ICD-10-CM | POA: Diagnosis not present

## 2013-07-30 DIAGNOSIS — Z630 Problems in relationship with spouse or partner: Secondary | ICD-10-CM

## 2013-08-01 DIAGNOSIS — G8929 Other chronic pain: Secondary | ICD-10-CM | POA: Diagnosis not present

## 2013-08-01 DIAGNOSIS — Z853 Personal history of malignant neoplasm of breast: Secondary | ICD-10-CM | POA: Diagnosis not present

## 2013-08-01 DIAGNOSIS — M549 Dorsalgia, unspecified: Secondary | ICD-10-CM | POA: Diagnosis not present

## 2013-08-01 NOTE — ED Provider Notes (Signed)
Medical screening examination/treatment/procedure(s) were performed by non-physician practitioner and as supervising physician I was immediately available for consultation/collaboration.  EKG Interpretation   None        Raeford Razor, MD 08/01/13 [phone]

## 2013-08-06 ENCOUNTER — Ambulatory Visit (INDEPENDENT_AMBULATORY_CARE_PROVIDER_SITE_OTHER): Payer: Medicare Other | Admitting: Psychiatry

## 2013-08-06 DIAGNOSIS — F4323 Adjustment disorder with mixed anxiety and depressed mood: Secondary | ICD-10-CM

## 2013-08-06 DIAGNOSIS — Z630 Problems in relationship with spouse or partner: Secondary | ICD-10-CM

## 2013-08-12 DIAGNOSIS — IMO0002 Reserved for concepts with insufficient information to code with codable children: Secondary | ICD-10-CM | POA: Diagnosis not present

## 2013-08-13 DIAGNOSIS — R300 Dysuria: Secondary | ICD-10-CM | POA: Diagnosis not present

## 2013-08-16 ENCOUNTER — Ambulatory Visit (INDEPENDENT_AMBULATORY_CARE_PROVIDER_SITE_OTHER): Payer: Medicare Other

## 2013-08-16 ENCOUNTER — Ambulatory Visit (INDEPENDENT_AMBULATORY_CARE_PROVIDER_SITE_OTHER): Payer: Medicare Other | Admitting: Gastroenterology

## 2013-08-16 ENCOUNTER — Encounter: Payer: Self-pay | Admitting: Gastroenterology

## 2013-08-16 VITALS — BP 128/82 | HR 78 | Ht 64.0 in | Wt 178.4 lb

## 2013-08-16 DIAGNOSIS — Z889 Allergy status to unspecified drugs, medicaments and biological substances status: Secondary | ICD-10-CM

## 2013-08-16 DIAGNOSIS — K5730 Diverticulosis of large intestine without perforation or abscess without bleeding: Secondary | ICD-10-CM

## 2013-08-16 DIAGNOSIS — K6389 Other specified diseases of intestine: Secondary | ICD-10-CM

## 2013-08-16 DIAGNOSIS — R140 Abdominal distension (gaseous): Secondary | ICD-10-CM

## 2013-08-16 DIAGNOSIS — K589 Irritable bowel syndrome without diarrhea: Secondary | ICD-10-CM

## 2013-08-16 DIAGNOSIS — K219 Gastro-esophageal reflux disease without esophagitis: Secondary | ICD-10-CM | POA: Diagnosis not present

## 2013-08-16 DIAGNOSIS — R141 Gas pain: Secondary | ICD-10-CM | POA: Diagnosis not present

## 2013-08-16 LAB — IBC PANEL
Iron: 74 ug/dL (ref 42–145)
Saturation Ratios: 21.8 % (ref 20.0–50.0)
Transferrin: 242.9 mg/dL (ref 212.0–360.0)

## 2013-08-16 LAB — FERRITIN: Ferritin: 87.9 ng/mL (ref 10.0–291.0)

## 2013-08-16 LAB — FOLATE: Folate: 14.3 ng/mL (ref >5.9)

## 2013-08-16 LAB — VITAMIN B12: Vitamin B-12: 298 pg/mL (ref 211–911)

## 2013-08-16 MED ORDER — LINACLOTIDE 290 MCG PO CAPS: 290.0000 ug | ORAL_CAPSULE | Freq: Every day | ORAL | Status: DC

## 2013-08-16 MED ORDER — RIFAXIMIN 550 MG PO TABS: 550.0000 mg | ORAL_TABLET | Freq: Two times a day (BID) | ORAL | Status: DC

## 2013-08-16 MED ORDER — VSL#3 PO CAPS: 1.0000 | ORAL_CAPSULE | Freq: Every day | ORAL | Status: DC

## 2013-08-16 NOTE — Patient Instructions (Signed)
Please go to the basement for lab work.  We have given you samples of the following medication to take: VSL # 3 probiotic, please take one capsule by mouth once daily Linzess 290 mcg, please take one capsule by mouth on an empty stomach once daily   We have sent the following medications to your pharmacy for you to pick up at your convenience: Xifaxan 550 mg, please take one tablet by mouth twice daily for two weeks and then stop

## 2013-08-16 NOTE — Progress Notes (Addendum)
This is a very complex 62 year old Caucasian female who I have been seeing for 30 years for IBS with gas, bloating, and alternating diarrhea and constipation.  She's had several extensive GI workups which are been unremarkable.  She was recently was placed on a FOD-MAP diet with some good success in terms of her symptoms, but she continued to complain of gas, bloating, and primarily constipation.  Workup for celiac disease, inflammatory bowel disease, other cause of malabsorption been negative.  Despite these complaints, she's had no anorexia or weight loss.  She has GERD well-controlled on daily PPI.  She also is on variety of different medications and has a long list of drug allergies.  She denies fever, chills, skin rashes, joint pains, oral stomatitis.  Endoscopy is completed within the last year.  She does have lactose intolerance.   ROS: All systems were reviewed and are negative unless otherwise stated in the HPI.          Physical Exam: Blood pressure 120/82, pulse 70 and regular weight 178 the BMI of 30.61.  I cannot appreciate stigmata of chronic liver disease.  Her abdomen is not distended and shows no organomegaly, masses or tenderness.  Bowel sounds are normal.  Mental status is normal.   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  She also appears to have a rather severe constipation problem.  I have decided to treat her with Linzess 290 mcg a day, also a trial of Xifaxan 550 mg twice a day for 2 weeks with VSL#3 probiotic therapy,  continue with her FOD-MAP diet, and we will continue her Protonix for GERD.  I have ordered stool exam for Calprotectin  and elastase- 1 exam, and anemia profile, CRP and sedimentation rate.  Review of her recent esophageal manometry showed some weak peristalsis but no evidence of an esophageal motility disorder otherwise.  Colonoscopy was negative in August of 2013.  Abdominal CT scan in July of this year was normal except for  sigmoid colon diverticulosis.  Ultrasound exam in 2013 showed no evidence of cholelithiasis.

## 2013-08-17 ENCOUNTER — Emergency Department (HOSPITAL_COMMUNITY): Payer: Medicare Other

## 2013-08-17 ENCOUNTER — Inpatient Hospital Stay (HOSPITAL_COMMUNITY)
Admission: EM | Admit: 2013-08-17 | Discharge: 2013-08-19 | DRG: 682 | Disposition: A | Discharge status: Home / Self Care | Payer: Medicare Other | Attending: Internal Medicine | Admitting: Internal Medicine

## 2013-08-17 DIAGNOSIS — Z79899 Other long term (current) drug therapy: Secondary | ICD-10-CM

## 2013-08-17 DIAGNOSIS — R7309 Other abnormal glucose: Secondary | ICD-10-CM | POA: Diagnosis not present

## 2013-08-17 DIAGNOSIS — F329 Major depressive disorder, single episode, unspecified: Secondary | ICD-10-CM | POA: Diagnosis present

## 2013-08-17 DIAGNOSIS — Z833 Family history of diabetes mellitus: Secondary | ICD-10-CM

## 2013-08-17 DIAGNOSIS — I10 Essential (primary) hypertension: Secondary | ICD-10-CM | POA: Diagnosis present

## 2013-08-17 DIAGNOSIS — E876 Hypokalemia: Secondary | ICD-10-CM | POA: Diagnosis present

## 2013-08-17 DIAGNOSIS — Z8249 Family history of ischemic heart disease and other diseases of the circulatory system: Secondary | ICD-10-CM | POA: Diagnosis not present

## 2013-08-17 DIAGNOSIS — Z853 Personal history of malignant neoplasm of breast: Secondary | ICD-10-CM | POA: Diagnosis not present

## 2013-08-17 DIAGNOSIS — R55 Syncope and collapse: Secondary | ICD-10-CM

## 2013-08-17 DIAGNOSIS — D72819 Decreased white blood cell count, unspecified: Secondary | ICD-10-CM | POA: Diagnosis present

## 2013-08-17 DIAGNOSIS — E860 Dehydration: Secondary | ICD-10-CM

## 2013-08-17 DIAGNOSIS — Z7901 Long term (current) use of anticoagulants: Secondary | ICD-10-CM | POA: Diagnosis not present

## 2013-08-17 DIAGNOSIS — R0602 Shortness of breath: Secondary | ICD-10-CM | POA: Diagnosis not present

## 2013-08-17 DIAGNOSIS — Z83719 Family history of colon polyps, unspecified: Secondary | ICD-10-CM

## 2013-08-17 DIAGNOSIS — R070 Pain in throat: Secondary | ICD-10-CM

## 2013-08-17 DIAGNOSIS — K5900 Constipation, unspecified: Secondary | ICD-10-CM

## 2013-08-17 DIAGNOSIS — I2510 Atherosclerotic heart disease of native coronary artery without angina pectoris: Secondary | ICD-10-CM | POA: Diagnosis present

## 2013-08-17 DIAGNOSIS — D649 Anemia, unspecified: Secondary | ICD-10-CM

## 2013-08-17 DIAGNOSIS — R05 Cough: Secondary | ICD-10-CM

## 2013-08-17 DIAGNOSIS — Z8719 Personal history of other diseases of the digestive system: Secondary | ICD-10-CM | POA: Diagnosis not present

## 2013-08-17 DIAGNOSIS — Z860100 Personal history of colon polyps, unspecified: Secondary | ICD-10-CM

## 2013-08-17 DIAGNOSIS — K589 Irritable bowel syndrome without diarrhea: Secondary | ICD-10-CM | POA: Diagnosis present

## 2013-08-17 DIAGNOSIS — Z801 Family history of malignant neoplasm of trachea, bronchus and lung: Secondary | ICD-10-CM | POA: Diagnosis not present

## 2013-08-17 DIAGNOSIS — N179 Acute kidney failure, unspecified: Secondary | ICD-10-CM

## 2013-08-17 DIAGNOSIS — F411 Generalized anxiety disorder: Secondary | ICD-10-CM

## 2013-08-17 DIAGNOSIS — K219 Gastro-esophageal reflux disease without esophagitis: Secondary | ICD-10-CM | POA: Diagnosis present

## 2013-08-17 DIAGNOSIS — K5730 Diverticulosis of large intestine without perforation or abscess without bleeding: Secondary | ICD-10-CM

## 2013-08-17 DIAGNOSIS — R739 Hyperglycemia, unspecified: Secondary | ICD-10-CM

## 2013-08-17 DIAGNOSIS — Z800 Family history of malignant neoplasm of digestive organs: Secondary | ICD-10-CM

## 2013-08-17 DIAGNOSIS — J189 Pneumonia, unspecified organism: Secondary | ICD-10-CM | POA: Diagnosis present

## 2013-08-17 DIAGNOSIS — G8929 Other chronic pain: Secondary | ICD-10-CM

## 2013-08-17 DIAGNOSIS — Z9861 Coronary angioplasty status: Secondary | ICD-10-CM

## 2013-08-17 DIAGNOSIS — F410 Panic disorder [episodic paroxysmal anxiety] without agoraphobia: Secondary | ICD-10-CM | POA: Diagnosis present

## 2013-08-17 DIAGNOSIS — R140 Abdominal distension (gaseous): Secondary | ICD-10-CM

## 2013-08-17 DIAGNOSIS — C50211 Malignant neoplasm of upper-inner quadrant of right female breast: Secondary | ICD-10-CM

## 2013-08-17 DIAGNOSIS — R1013 Epigastric pain: Secondary | ICD-10-CM

## 2013-08-17 DIAGNOSIS — F319 Bipolar disorder, unspecified: Secondary | ICD-10-CM

## 2013-08-17 DIAGNOSIS — Z8371 Family history of colonic polyps: Secondary | ICD-10-CM

## 2013-08-17 DIAGNOSIS — M545 Low back pain, unspecified: Secondary | ICD-10-CM

## 2013-08-17 DIAGNOSIS — R141 Gas pain: Secondary | ICD-10-CM

## 2013-08-17 DIAGNOSIS — T148XXA Other injury of unspecified body region, initial encounter: Secondary | ICD-10-CM

## 2013-08-17 DIAGNOSIS — IMO0002 Reserved for concepts with insufficient information to code with codable children: Secondary | ICD-10-CM

## 2013-08-17 DIAGNOSIS — R062 Wheezing: Secondary | ICD-10-CM | POA: Diagnosis not present

## 2013-08-17 DIAGNOSIS — R059 Cough, unspecified: Secondary | ICD-10-CM

## 2013-08-17 DIAGNOSIS — K602 Anal fissure, unspecified: Secondary | ICD-10-CM

## 2013-08-17 DIAGNOSIS — I252 Old myocardial infarction: Secondary | ICD-10-CM

## 2013-08-17 DIAGNOSIS — J45909 Unspecified asthma, uncomplicated: Secondary | ICD-10-CM | POA: Diagnosis present

## 2013-08-17 DIAGNOSIS — R197 Diarrhea, unspecified: Secondary | ICD-10-CM | POA: Diagnosis not present

## 2013-08-17 DIAGNOSIS — M549 Dorsalgia, unspecified: Secondary | ICD-10-CM | POA: Diagnosis present

## 2013-08-17 DIAGNOSIS — K449 Diaphragmatic hernia without obstruction or gangrene: Secondary | ICD-10-CM

## 2013-08-17 DIAGNOSIS — I959 Hypotension, unspecified: Secondary | ICD-10-CM

## 2013-08-17 DIAGNOSIS — F3289 Other specified depressive episodes: Secondary | ICD-10-CM

## 2013-08-17 LAB — GLUCOSE, CAPILLARY: Glucose-Capillary: 148 mg/dL — ABNORMAL HIGH (ref 70–99)

## 2013-08-17 LAB — POCT I-STAT TROPONIN I: Troponin i, poc: 0 ng/mL (ref 0.00–0.08)

## 2013-08-17 LAB — BASIC METABOLIC PANEL
BUN: 18 mg/dL (ref 6–23)
CO2: 27 mEq/L (ref 19–32)
Calcium: 9.5 mg/dL (ref 8.4–10.5)
GFR calc non Af Amer: 42 mL/min — ABNORMAL LOW (ref >90)
Glucose, Bld: 178 mg/dL — ABNORMAL HIGH (ref 70–99)
Potassium: 3.2 mEq/L — ABNORMAL LOW (ref 3.5–5.1)
Sodium: 135 mEq/L (ref 135–145)

## 2013-08-17 MED ORDER — SODIUM CHLORIDE 0.9 % IV SOLN: 1000.0000 mL | INTRAVENOUS | Status: DC

## 2013-08-17 MED ORDER — SODIUM CHLORIDE 0.9 % IV SOLN
1000.0000 mL | Freq: Once | INTRAVENOUS | Status: AC
  Administered 2013-08-18: 1000 mL via INTRAVENOUS

## 2013-08-17 MED ORDER — SODIUM CHLORIDE 0.9 % IV BOLUS (SEPSIS)
1000.0000 mL | Freq: Once | INTRAVENOUS | Status: AC
  Administered 2013-08-17: 1000 mL via INTRAVENOUS

## 2013-08-17 MED ORDER — SODIUM CHLORIDE 0.9 % IV SOLN: 1000.0000 mL | Freq: Once | INTRAVENOUS | Status: DC

## 2013-08-17 NOTE — ED Provider Notes (Signed)
CSN: [id number]     Arrival date & time 08/17/13  2230 History   First MD Initiated Contact with Patient 08/17/13 2300     Chief Complaint  Patient presents with  . Chest Pain  . Shortness of Breath  . Hypotension   (Consider location/radiation/quality/duration/timing/severity/associated sxs/prior Treatment) HPI Comments: The patient is a 62 year-old female with a past medical history of CAD with a history of MI, IBS, breast cancer, presenting the Emergency Department with a chief complaint of altered mental status.  The patient's spouse reports she went to bed at 2200 and woke up at 2215 and stated "I don't feel well I need to go to the hospital".  She reports nausea lightheadedness.  Upon further questioning the patient reports chest tightness and associated shortness of breath and felt as though she was "suffocating". She also reports 12 episodes of diarrhea yesterday which has resolved.   The history is provided by the patient, the spouse and medical records. No language interpreter was used.    Past Medical History  Diagnosis Date  . Myocardial infarct 2005  . CAD (coronary artery disease)   . Hypertension   . Long term (current) use of anticoagulants   . Edema   . Hematoma   . Sinusitis acute   . Adenocarcinoma of breast     right  . Acute upper respiratory infections of unspecified site   . Depression   . Anal fissure   . GERD (gastroesophageal reflux disease)   . Dog bite(E906.0)   . Diverticulosis of colon (without mention of hemorrhage)   . Spinal stenosis, lumbar region, without neurogenic claudication   . Anxiety   . Panic attacks   . Asthma   . Irritable bowel syndrome   . Hiatal hernia   . Jaundice     Hx of Jaundice at age 33 from "dirty restuarant". Unsure of Hepatitis type   Past Surgical History  Procedure Laterality Date  . Partial hysterectomy  1987  . Cardiac catheterization  2001  . Coronary angioplasty with stent placement  2001  . Cholecystectomy     . Bladder repair      tact  . Breast lumpectomy Right   . Breast reconstruction Right   . Breast reduction surgery Left   . Colonoscopy    . Cataract extraction    . Esophageal manometry  10/08/2012    Procedure: ESOPHAGEAL MANOMETRY (EM);  Surgeon: Mardella Layman, MD;  Location: [facility];  Service: Endoscopy;  Laterality: N/A;   Family History  Problem Relation Age of Onset  . Hypertension Mother   . Heart disease Mother   . Dementia Mother   . Arthritis Mother   . Diabetes Mother   . Prostate cancer Father   . Lung cancer Maternal Uncle   . Hypertension Father   . Hypertension Sister   . Hypertension Brother   . Heart disease Sister   . Rectal cancer Neg Hx   . Stomach cancer Neg Hx   . Colon cancer Cousin   . Colon polyps Mother   . Colon polyps Father   . Inflammatory bowel disease Sister    History  Substance Use Topics  . Smoking status: Former Smoker -- 0.30 packs/day for 8 years    Types: Cigarettes    Quit date: 09/13/1983  . Smokeless tobacco: Never Used  . Alcohol Use: No   OB History   Grav Para Term Preterm Abortions TAB SAB Ect Mult Living  Review of Systems  Constitutional: Negative for fever and chills.  HENT: Positive for ear pain.   Eyes: Negative for photophobia and visual disturbance.  Respiratory: Positive for cough and chest tightness.   Gastrointestinal: Positive for nausea. Negative for vomiting and abdominal distention.  Neurological: Positive for weakness and light-headedness. Negative for facial asymmetry and numbness.  All other systems reviewed and are negative.    Allergies  Amoxicillin; Azithromycin; Bromfed; Cephalexin; Chlordiazepoxide-clidinium; Clotrimazole; Dicyclomine hcl; Hydralazine hcl; Ibuprofen; Iohexol; Latex; Lidocaine; Paroxetine; Penicillins; Propoxyphene-acetaminophen; Sertraline hcl; Sulfadiazine; and Verapamil  Home Medications   Current Outpatient Rx  Name  Route  Sig  Dispense   Refill  . acetaminophen (TYLENOL) 500 MG tablet   Oral   Take 500 mg by mouth every 8 (eight) hours as needed (pain).          Marland Kitchen ALPRAZolam (XANAX) 1 MG tablet   Oral   Take 0.5-1 mg by mouth 2 (two) times daily as needed for anxiety or sleep.          Marland Kitchen ascorbic acid (VITAMIN C) 1000 MG tablet   Oral   Take 1,000 mg by mouth daily.         Marland Kitchen aspirin 81 MG tablet   Oral   Take 81 mg by mouth daily.           . busPIRone (BUSPAR) 10 MG tablet   Oral   Take 10 mg by mouth 2 (two) times daily.          . Calcium Carbonate-Vitamin D (CALCIUM-CARB 600 + D) 600-125 MG-UNIT TABS   Oral   Take 1 capsule by mouth 2 (two) times daily.          . Camphor-Menthol-Methyl Sal (SALONPAS) 1.2-5.7-6.3 % PTCH   Apply externally   Apply 3 patches topically daily. Removes patches at night         . cyclobenzaprine (FLEXERIL) 5 MG tablet               . cycloSPORINE (RESTASIS) 0.05 % ophthalmic emulsion   Both Eyes   Place 1 drop into both eyes 2 (two) times daily.           . furosemide (LASIX) 40 MG tablet   Oral   Take 1 tablet (40 mg total) by mouth daily.   30 tablet   11   . gabapentin (NEURONTIN) 300 MG capsule   Oral   Take 300-900 mg by mouth at bedtime. Take 1 cap at bedtime then 2 caps times 7 days then 3 caps qhs         . Linaclotide (LINZESS) 290 MCG CAPS capsule   Oral   Take 1 capsule (290 mcg total) by mouth daily.   12 capsule   0   . methocarbamol (ROBAXIN) 500 MG tablet   Oral   Take 2 tablets (1,000 mg total) by mouth 4 (four) times daily as needed (Pain).   20 tablet   0   . metoprolol (LOPRESSOR) 50 MG tablet   Oral   Take 1 tablet (50 mg total) by mouth 2 (two) times daily.   180 tablet   3   . pantoprazole (PROTONIX) 40 MG tablet   Oral   Take 40 mg by mouth daily.          . pravastatin (PRAVACHOL) 40 MG tablet   Oral   Take 40 mg by mouth every evening.          Marland Kitchen  Probiotic Product (PROBIOTIC DAILY) CAPS   Oral    Take 1 capsule by mouth daily.         . rifaximin (XIFAXAN) 550 MG TABS tablet   Oral   Take 1 tablet (550 mg total) by mouth 2 (two) times daily.   20 tablet   0     Take for two weeks and stop   . sodium chloride (OCEAN) 0.65 % nasal spray   Nasal   Place 1 spray into the nose as needed for congestion.         . valsartan (DIOVAN) 160 MG tablet   Oral   Take 160 mg by mouth 2 (two) times daily.          . vitamin E (VITAMIN E) 400 UNIT capsule   Oral   Take 400 Units by mouth daily.         . VOLTAREN 1 % GEL   Topical   Apply 2 g topically 2 (two) times daily as needed (pain).           BP 60/43  Pulse 90  Temp(Src) 98.3 F (36.8 C) (Oral)  Resp 18  SpO2 95% Physical Exam  Nursing note and vitals reviewed. Constitutional: She is oriented to person, place, and time. She appears well-developed and well-nourished.  HENT:  Head: Normocephalic and atraumatic.  Left Ear: Tympanic membrane normal.  Nose: No mucosal edema or rhinorrhea.  Mouth/Throat: Uvula is midline. Mucous membranes are dry. Posterior oropharyngeal erythema present. No oropharyngeal exudate or posterior oropharyngeal edema.  Eyes: EOM are normal. Pupils are equal, round, and reactive to light. Left eye exhibits no discharge. No scleral icterus.  Neck: Neck supple. No JVD present.  Cardiovascular: Normal rate and regular rhythm.  Exam reveals distant heart sounds.   Pulses:      Radial pulses are 2+ on the right side, and 2+ on the left side.  Pulmonary/Chest: Effort normal and breath sounds normal. No accessory muscle usage. No respiratory distress. She has no decreased breath sounds. She has no wheezes. She has no rales.  Abdominal: Soft. Bowel sounds are normal. She exhibits no distension. There is no tenderness. There is no rebound and no guarding.  Musculoskeletal: She exhibits no edema.  Lymphadenopathy:       Head (right side): No submental, no submandibular and no tonsillar adenopathy  present.       Head (left side): No submental, no submandibular and no tonsillar adenopathy present.  Neurological: She is alert and oriented to person, place, and time. No cranial nerve deficit or sensory deficit.  Skin: Skin is warm and dry.  Psychiatric: She has a normal mood and affect. Her behavior is normal.    ED Course  Procedures (including critical care time) Labs Review Labs Reviewed  CBC - Abnormal; Notable for the following:    HCT 35.6 (*)    All other components within normal limits  BASIC METABOLIC PANEL - Abnormal; Notable for the following:    Potassium 3.2 (*)    Glucose, Bld 178 (*)    Creatinine, Ser 1.34 (*)    GFR calc non Af Amer 42 (*)    GFR calc Af Amer 49 (*)    All other components within normal limits  GLUCOSE, CAPILLARY - Abnormal; Notable for the following:    Glucose-Capillary 148 (*)    All other components within normal limits  PROTIME-INR  POCT I-STAT TROPONIN I  CG4 I-STAT (LACTIC ACID)   Imaging Review  Dg Chest 2 View  08/18/2013   CLINICAL DATA:  Shortness of breath and cough.  EXAM: CHEST  2 VIEW  COMPARISON:  03/05/2011  FINDINGS: Two views of the chest were obtained. There is no evidence for airspace disease. Heart and mediastinum are within normal limits. Density at the left costophrenic angle but no definite pleural fluid based on the lateral view.  IMPRESSION: No acute chest findings.   Electronically Signed   By: Richarda Overlie M.D.   On: 08/18/2013 00:51   Ct Head (brain) Wo Contrast  08/18/2013   CLINICAL DATA:  Presyncope; hypotension.  EXAM: CT HEAD WITHOUT CONTRAST  TECHNIQUE: Contiguous axial images were obtained from the base of the skull through the vertex without intravenous contrast.  COMPARISON:  MRI of the brain performed 02/26/2009, and maxillofacial CT performed 07/17/2010  FINDINGS: There is no evidence of acute infarction, mass lesion, or intra- or extra-axial hemorrhage on CT.  Mild periventricular white matter change likely  reflects small vessel ischemic microangiopathy.  The posterior fossa, including the cerebellum, brainstem and fourth ventricle, is within normal limits. The third and lateral ventricles, and basal ganglia are unremarkable in appearance. The cerebral hemispheres demonstrate grossly normal gray-white differentiation. No mass effect or midline shift is seen.  There is no evidence of fracture; visualized osseous structures are unremarkable in appearance. The visualized portions of the orbits are within normal limits. The paranasal sinuses and mastoid air cells are well-aerated. No significant soft tissue abnormalities are seen.  IMPRESSION: 1. No acute intracranial pathology seen on CT. 2. Mild small vessel ischemic microangiopathy.   Electronically Signed   By: Roanna Raider M.D.   On: 08/18/2013 00:48    EKG Interpretation    Date/Time:  Saturday August 17 2013 22:59:54 EST Ventricular Rate:  86 PR Interval:  175 QRS Duration: 83 QT Interval:  394 QTC Calculation: 471 R Axis:   55 Text Interpretation:  Sinus rhythm Low voltage, precordial leads ED PHYSICIAN INTERPRETATION AVAILABLE IN [facility] Confirmed by TEST, RECORD ([id number]), editor CLAYTON  CCT  CETT, ROBIN (2) on 08/19/2013 9:39:48 AM            MDM   Hypotension Dehydration  Pt presents with abnormal behavior per spouse and BP's in 6/43 in ED.  Will rule out ACS and CVA/TIA etiology.  Labs, imaging, fluids ordered. Discussed patient history and condition with Dr. Norlene Campbell who agrees on current plan for admission for hypotension. BP-117/70's, BP-130/80 Discussed patient history, condition, and work-up with Dr. Lovell Sheehan who will admit the patient for hypotension and dehydration.     Clabe Seal, PA-C 08/21/13 [phone]

## 2013-08-17 NOTE — ED Notes (Addendum)
Pt reports that she woke up from a nap this evening and did not feel well. The patient reports pre-syncope, centralized chest pain, shortness of breath, dizziness, and nausea. Pt is pale in appearance. Pt is hypotensive at 60/43 (best out of two readings) in triage exam room 2 and transferred to resuscitation room B.

## 2013-08-17 NOTE — ED Notes (Signed)
Pt states she took a nap and woke up and felt nausea and like she was going to pass out,  Pt has complaints of right ear pain.  Pt says her mouth feels like cotton,  Stroke scale completed.  Pt went to bed at 10 pm and fell asleep fast snoring per her husband,  Then 1015 woke up light headed, sob and feels like her mouth is thick.

## 2013-08-17 NOTE — ED Notes (Signed)
Patient transported to CT 

## 2013-08-18 ENCOUNTER — Encounter (HOSPITAL_COMMUNITY): Payer: Self-pay | Admitting: *Deleted

## 2013-08-18 ENCOUNTER — Inpatient Hospital Stay (HOSPITAL_COMMUNITY): Payer: Medicare Other

## 2013-08-18 DIAGNOSIS — N179 Acute kidney failure, unspecified: Secondary | ICD-10-CM

## 2013-08-18 DIAGNOSIS — J45909 Unspecified asthma, uncomplicated: Secondary | ICD-10-CM

## 2013-08-18 DIAGNOSIS — R55 Syncope and collapse: Secondary | ICD-10-CM

## 2013-08-18 DIAGNOSIS — K589 Irritable bowel syndrome without diarrhea: Secondary | ICD-10-CM

## 2013-08-18 DIAGNOSIS — E860 Dehydration: Secondary | ICD-10-CM

## 2013-08-18 DIAGNOSIS — I2510 Atherosclerotic heart disease of native coronary artery without angina pectoris: Secondary | ICD-10-CM

## 2013-08-18 DIAGNOSIS — I959 Hypotension, unspecified: Secondary | ICD-10-CM

## 2013-08-18 LAB — PROTIME-INR
INR: 1.02 (ref 0.00–1.49)
Prothrombin Time: 13.2 seconds (ref 11.6–15.2)

## 2013-08-18 LAB — GLUCOSE, CAPILLARY
Glucose-Capillary: 138 mg/dL — ABNORMAL HIGH (ref 70–99)
Glucose-Capillary: 92 mg/dL (ref 70–99)

## 2013-08-18 LAB — BASIC METABOLIC PANEL
BUN: 16 mg/dL (ref 6–23)
Calcium: 8.6 mg/dL (ref 8.4–10.5)
Creatinine, Ser: 0.83 mg/dL (ref 0.50–1.10)
GFR calc Af Amer: 87 mL/min — ABNORMAL LOW (ref >90)
GFR calc non Af Amer: 75 mL/min — ABNORMAL LOW (ref >90)
Glucose, Bld: 142 mg/dL — ABNORMAL HIGH (ref 70–99)

## 2013-08-18 LAB — CBC
HCT: 35.6 % — ABNORMAL LOW (ref 36.0–46.0)
Hemoglobin: 12.1 g/dL (ref 12.0–15.0)
MCH: 30.3 pg (ref 26.0–34.0)
MCH: 30.6 pg (ref 26.0–34.0)
MCHC: 34 g/dL (ref 30.0–36.0)
MCHC: 34.1 g/dL (ref 30.0–36.0)
MCV: 89.7 fL (ref 78.0–100.0)
Platelets: 196 10*3/uL (ref 150–400)
RBC: 4 MIL/uL (ref 3.87–5.11)
RBC: 3.6 MIL/uL — ABNORMAL LOW (ref 3.87–5.11)
RDW: 13.2 % (ref 11.5–15.5)

## 2013-08-18 LAB — CG4 I-STAT (LACTIC ACID): Lactic Acid, Venous: 1.94 mmol/L (ref 0.5–2.2)

## 2013-08-18 MED ORDER — IPRATROPIUM BROMIDE 0.02 % IN SOLN
0.5000 mg | RESPIRATORY_TRACT | Status: DC
  Administered 2013-08-18: 0.5 mg via RESPIRATORY_TRACT
  Filled 2013-08-18: qty 2.5

## 2013-08-18 MED ORDER — ACETAMINOPHEN 325 MG PO TABS: 650.0000 mg | ORAL_TABLET | Freq: Four times a day (QID) | ORAL | Status: DC | PRN

## 2013-08-18 MED ORDER — PANTOPRAZOLE SODIUM 40 MG PO TBEC
40.0000 mg | DELAYED_RELEASE_TABLET | Freq: Every day | ORAL | Status: DC
  Administered 2013-08-18 – 2013-08-19 (×2): 40 mg via ORAL
  Filled 2013-08-18 (×2): qty 1

## 2013-08-18 MED ORDER — ALBUTEROL SULFATE (5 MG/ML) 0.5% IN NEBU: 2.5000 mg | INHALATION_SOLUTION | RESPIRATORY_TRACT | Status: DC

## 2013-08-18 MED ORDER — ENOXAPARIN SODIUM 40 MG/0.4ML ~~LOC~~ SOLN
40.0000 mg | SUBCUTANEOUS | Status: DC
  Administered 2013-08-18 – 2013-08-19 (×2): 40 mg via SUBCUTANEOUS
  Filled 2013-08-18 (×3): qty 0.4

## 2013-08-18 MED ORDER — ALUM & MAG HYDROXIDE-SIMETH 200-200-20 MG/5ML PO SUSP: 30.0000 mL | Freq: Four times a day (QID) | ORAL | Status: DC | PRN

## 2013-08-18 MED ORDER — ACETAMINOPHEN 500 MG PO TABS
500.0000 mg | ORAL_TABLET | Freq: Three times a day (TID) | ORAL | Status: DC | PRN
  Administered 2013-08-19: 500 mg via ORAL
  Filled 2013-08-18: qty 1

## 2013-08-18 MED ORDER — SODIUM CHLORIDE 0.9 % IV SOLN
INTRAVENOUS | Status: DC
  Administered 2013-08-18 – 2013-08-19 (×3): via INTRAVENOUS

## 2013-08-18 MED ORDER — SODIUM CHLORIDE 0.9 % IV SOLN: INTRAVENOUS | Status: DC

## 2013-08-18 MED ORDER — SODIUM CHLORIDE 0.9 % IJ SOLN
3.0000 mL | Freq: Two times a day (BID) | INTRAMUSCULAR | Status: DC
  Administered 2013-08-18 (×2): 3 mL via INTRAVENOUS

## 2013-08-18 MED ORDER — SALINE SPRAY 0.65 % NA SOLN
1.0000 | NASAL | Status: DC | PRN
  Filled 2013-08-18: qty 44

## 2013-08-18 MED ORDER — DOXYCYCLINE HYCLATE 100 MG PO TABS
100.0000 mg | ORAL_TABLET | Freq: Two times a day (BID) | ORAL | Status: DC
  Administered 2013-08-18 – 2013-08-19 (×2): 100 mg via ORAL
  Filled 2013-08-18 (×4): qty 1

## 2013-08-18 MED ORDER — ONDANSETRON HCL 4 MG/2ML IJ SOLN: 4.0000 mg | Freq: Four times a day (QID) | INTRAMUSCULAR | Status: DC | PRN

## 2013-08-18 MED ORDER — ASPIRIN EC 81 MG PO TBEC
81.0000 mg | DELAYED_RELEASE_TABLET | Freq: Every day | ORAL | Status: DC
  Administered 2013-08-18 – 2013-08-19 (×2): 81 mg via ORAL
  Filled 2013-08-18 (×2): qty 1

## 2013-08-18 MED ORDER — ALBUTEROL SULFATE (5 MG/ML) 0.5% IN NEBU
2.5000 mg | INHALATION_SOLUTION | RESPIRATORY_TRACT | Status: DC
  Administered 2013-08-18: 2.5 mg via RESPIRATORY_TRACT
  Filled 2013-08-18: qty 0.5

## 2013-08-18 MED ORDER — ACETAMINOPHEN 650 MG RE SUPP: 650.0000 mg | Freq: Four times a day (QID) | RECTAL | Status: DC | PRN

## 2013-08-18 MED ORDER — ONDANSETRON HCL 4 MG PO TABS: 4.0000 mg | ORAL_TABLET | Freq: Four times a day (QID) | ORAL | Status: DC | PRN

## 2013-08-18 MED ORDER — RIFAXIMIN 550 MG PO TABS
550.0000 mg | ORAL_TABLET | Freq: Two times a day (BID) | ORAL | Status: DC
  Administered 2013-08-18 – 2013-08-19 (×3): 550 mg via ORAL
  Filled 2013-08-18 (×4): qty 1

## 2013-08-18 MED ORDER — ZOLPIDEM TARTRATE 5 MG PO TABS: 5.0000 mg | ORAL_TABLET | Freq: Every evening | ORAL | Status: DC | PRN

## 2013-08-18 MED ORDER — ALPRAZOLAM 0.5 MG PO TABS
0.5000 mg | ORAL_TABLET | Freq: Every day | ORAL | Status: DC
  Administered 2013-08-18 – 2013-08-19 (×2): 0.5 mg via ORAL
  Filled 2013-08-18 (×2): qty 1

## 2013-08-18 MED ORDER — BUSPIRONE HCL 10 MG PO TABS
10.0000 mg | ORAL_TABLET | Freq: Two times a day (BID) | ORAL | Status: DC
  Administered 2013-08-18 – 2013-08-19 (×3): 10 mg via ORAL
  Filled 2013-08-18 (×4): qty 1

## 2013-08-18 MED ORDER — MOMETASONE FURO-FORMOTEROL FUM 100-5 MCG/ACT IN AERO
2.0000 | INHALATION_SPRAY | Freq: Two times a day (BID) | RESPIRATORY_TRACT | Status: DC
  Administered 2013-08-18 – 2013-08-19 (×2): 2 via RESPIRATORY_TRACT
  Filled 2013-08-18: qty 8.8

## 2013-08-18 MED ORDER — GABAPENTIN 300 MG PO CAPS
300.0000 mg | ORAL_CAPSULE | Freq: Every day | ORAL | Status: DC
  Administered 2013-08-18: 300 mg via ORAL
  Filled 2013-08-18 (×2): qty 1

## 2013-08-18 MED ORDER — ALBUTEROL SULFATE (5 MG/ML) 0.5% IN NEBU
2.5000 mg | INHALATION_SOLUTION | Freq: Three times a day (TID) | RESPIRATORY_TRACT | Status: DC
  Administered 2013-08-18: 2.5 mg via RESPIRATORY_TRACT
  Filled 2013-08-18: qty 0.5

## 2013-08-18 MED ORDER — IPRATROPIUM BROMIDE 0.02 % IN SOLN
0.5000 mg | Freq: Three times a day (TID) | RESPIRATORY_TRACT | Status: DC
  Administered 2013-08-18: 0.5 mg via RESPIRATORY_TRACT
  Filled 2013-08-18: qty 2.5

## 2013-08-18 MED ORDER — SACCHAROMYCES BOULARDII 250 MG PO CAPS
250.0000 mg | ORAL_CAPSULE | Freq: Two times a day (BID) | ORAL | Status: DC
  Administered 2013-08-18 – 2013-08-19 (×3): 250 mg via ORAL
  Filled 2013-08-18 (×4): qty 1

## 2013-08-18 MED ORDER — DICLOFENAC SODIUM 1 % TD GEL
4.0000 g | Freq: Two times a day (BID) | TRANSDERMAL | Status: DC
  Administered 2013-08-18 – 2013-08-19 (×3): 4 g via TOPICAL
  Filled 2013-08-18: qty 100

## 2013-08-18 MED ORDER — SIMVASTATIN 20 MG PO TABS
20.0000 mg | ORAL_TABLET | Freq: Every day | ORAL | Status: DC
  Administered 2013-08-18: 20 mg via ORAL
  Filled 2013-08-18 (×2): qty 1

## 2013-08-18 MED ORDER — CYCLOSPORINE 0.05 % OP EMUL
1.0000 [drp] | Freq: Two times a day (BID) | OPHTHALMIC | Status: DC
  Administered 2013-08-18 – 2013-08-19 (×3): 1 [drp] via OPHTHALMIC
  Filled 2013-08-18 (×4): qty 1

## 2013-08-18 MED ORDER — ALPRAZOLAM 1 MG PO TABS
1.0000 mg | ORAL_TABLET | Freq: Every day | ORAL | Status: DC
  Administered 2013-08-18: 1 mg via ORAL
  Filled 2013-08-18: qty 1

## 2013-08-18 MED ORDER — DIPHENHYDRAMINE HCL 25 MG PO CAPS
25.0000 mg | ORAL_CAPSULE | Freq: Four times a day (QID) | ORAL | Status: DC | PRN
  Administered 2013-08-18: 25 mg via ORAL
  Filled 2013-08-18: qty 1

## 2013-08-18 MED ORDER — ALBUTEROL SULFATE (5 MG/ML) 0.5% IN NEBU: 2.5000 mg | INHALATION_SOLUTION | RESPIRATORY_TRACT | Status: DC | PRN

## 2013-08-18 MED ORDER — IPRATROPIUM BROMIDE 0.02 % IN SOLN: 0.5000 mg | Freq: Three times a day (TID) | RESPIRATORY_TRACT | Status: DC

## 2013-08-18 MED ORDER — HYDROMORPHONE HCL PF 1 MG/ML IJ SOLN
0.5000 mg | INTRAMUSCULAR | Status: DC | PRN
  Administered 2013-08-18: 1 mg via INTRAVENOUS
  Filled 2013-08-18: qty 1

## 2013-08-18 NOTE — Progress Notes (Signed)
Gave patient aero chamber for her Dulera MDI

## 2013-08-18 NOTE — ED Notes (Signed)
Dr. Jenkins at bedside. 

## 2013-08-18 NOTE — Progress Notes (Addendum)
[facility] PROGRESS NOTE  VIRTIE BUNGERT ZOX:[id number] DOB: [birth year] DOA: 08/17/2013 PCP: No PCP Per Patient  Assessment/Plan  Hypotension, resolving with IVF -  Continue IVF - Continue to hold lasix, metoprolol, and valsartan  Chest pain, resolved.   -  troponins neg -  Telemetry:  NSR -  Okay to d/c telemetry  Increased cough with wheezing.  Ddx includes acute asthma exacerbation, pneumonia.  NO evidence of heart failure on CXR and patient likely still hypovolemic -  CXR:  Atelectasis vs. Infiltrate -  Start doxycycline  -  Start dulera (patient has advair at home) -  Start duonebs -  decrease IVF for now -  Incentive spirometer  AKI, resolved with IVF.  Creatinine trended down from 1.34 to 0.83  Diarrhea, possible due to IBS or viral illness.  Resolved.   -  Okay to resume linzess tomorrow, patient may have someone bring from home -  Resume florastor and rifaximin  Anxiety and depression.  Restart buspar and xanax  Chronic pain in back, followed by pain clinic -  Restart flexeril and gabapentin  Leukopenia may be due to hemodilution, or sequestration  -  Monitor for sepsis -  Repeat in AM  Anemia, normocytic, likely hemodilutional -  Repeat in AM  Hypokalemia, resolved with supplementation  Diet:  Healthy heart Access:  PIV IVF:  kvo Proph:  lovenox  Code Status: full Family Communication: patient alone Disposition Plan: pending improvement in breathing, tolerating liquids   Consultants:  None  Procedures:  CT brain  CXR   Antibiotics:  Doxycycline 12/7 >>   HPI/Subjective:  Diarrhea resolved.  Lightheadedness improved.  Able to eat breakfast.  This afternoon, developed wheezing and SOB.  Has had ongoing productive cough with chest pains.    Objective: Filed Vitals:   08/18/13 0148 08/18/13 0150 08/18/13 0400 08/18/13 1445  BP: 132/80  129/81   Pulse: 80  85   Temp:  97.8 F (36.6 C) 98 F (36.7 C)   TempSrc:  Oral Oral    Resp:  18 18   Height:   5\' 4"  (1.626 m)   Weight:   81.8 kg (180 lb 5.4 oz)   SpO2:   100% 98%    Intake/Output Summary (Last 24 hours) at 08/18/13 1603 Last data filed at 08/18/13 1100  Gross per 24 hour  Intake 717.92 ml  Output    200 ml  Net 517.92 ml   Filed Weights   08/18/13 0400  Weight: 81.8 kg (180 lb 5.4 oz)    Exam:   General:  CF, No acute distress  HEENT:  NCAT, MMM  Cardiovascular:  RRR, nl S1, S2 no mrg, 2+ pulses, warm extremities  Respiratory:  Diminished bilateral breath sounds with rhonchi, no focal rales, + wheeze, no increased WOB  Abdomen:   NABS, soft, mildly TTP diffusely without rebound or guarding.  nondistended  MSK:   Normal tone and bulk, no LEE  Neuro:  Grossly intact  Data Reviewed: Basic Metabolic Panel:  Recent Labs Lab 08/17/13 2310 08/18/13 0526  NA 135 141  K 3.2* 3.7  CL 99 109  CO2 27 25  GLUCOSE 178* 142*  BUN 18 16  CREATININE 1.34* 0.83  CALCIUM 9.5 8.6   Liver Function Tests: No results found for this basename: AST, ALT, ALKPHOS, BILITOT, PROT, ALBUMIN,  in the last 168 hours No results found for this basename: LIPASE, AMYLASE,  in the last 168 hours No results found for this basename:  AMMONIA,  in the last 168 hours CBC:  Recent Labs Lab 08/17/13 2310 08/18/13 0526  WBC 5.4 3.5*  HGB 12.1 11.0*  HCT 35.6* 32.3*  MCV 89.0 89.7  PLT 196 157   Cardiac Enzymes: No results found for this basename: CKTOTAL, CKMB, CKMBINDEX, TROPONINI,  in the last 168 hours BNP (last 3 results) No results found for this basename: PROBNP,  in the last 8760 hours CBG:  Recent Labs Lab 08/17/13 2315 08/18/13 0749 08/18/13 1216  GLUCAP 148* 138* 92    No results found for this or any previous visit (from the past 240 hour(s)).   Studies: Dg Chest 2 View  08/18/2013   CLINICAL DATA:  Shortness of breath and cough.  EXAM: CHEST  2 VIEW  COMPARISON:  03/05/2011  FINDINGS: Two views of the chest were obtained. There  is no evidence for airspace disease. Heart and mediastinum are within normal limits. Density at the left costophrenic angle but no definite pleural fluid based on the lateral view.  IMPRESSION: No acute chest findings.   Electronically Signed   By: Richarda Overlie M.D.   On: 08/18/2013 00:51   Ct Head (brain) Wo Contrast  08/18/2013   CLINICAL DATA:  Presyncope; hypotension.  EXAM: CT HEAD WITHOUT CONTRAST  TECHNIQUE: Contiguous axial images were obtained from the base of the skull through the vertex without intravenous contrast.  COMPARISON:  MRI of the brain performed 02/26/2009, and maxillofacial CT performed 07/17/2010  FINDINGS: There is no evidence of acute infarction, mass lesion, or intra- or extra-axial hemorrhage on CT.  Mild periventricular white matter change likely reflects small vessel ischemic microangiopathy.  The posterior fossa, including the cerebellum, brainstem and fourth ventricle, is within normal limits. The third and lateral ventricles, and basal ganglia are unremarkable in appearance. The cerebral hemispheres demonstrate grossly normal gray-white differentiation. No mass effect or midline shift is seen.  There is no evidence of fracture; visualized osseous structures are unremarkable in appearance. The visualized portions of the orbits are within normal limits. The paranasal sinuses and mastoid air cells are well-aerated. No significant soft tissue abnormalities are seen.  IMPRESSION: 1. No acute intracranial pathology seen on CT. 2. Mild small vessel ischemic microangiopathy.   Electronically Signed   By: Roanna Raider M.D.   On: 08/18/2013 00:48   Dg Chest Port 1 View  08/18/2013   CLINICAL DATA:  Shortness of breath, wheezing  EXAM: PORTABLE CHEST - 1 VIEW  COMPARISON:  08/17/2013  FINDINGS: Mild patchy right basilar opacity, likely atelectasis. Mild patchy left lower lobe opacity, atelectasis versus pneumonia. No pleural effusion or pneumothorax.  The heart is top-normal in size.   Cholecystectomy clips.  IMPRESSION: Mild patchy left lower lobe opacity, atelectasis versus pneumonia.  Mild patchy right basilar opacity, likely atelectasis   Electronically Signed   By: Charline Bills M.D.   On: 08/18/2013 14:47    Scheduled Meds: . sodium chloride  1,000 mL Intravenous Once  . albuterol  2.5 mg Nebulization TID   And  . ipratropium  0.5 mg Nebulization TID  . ALPRAZolam  0.5 mg Oral Daily  . ALPRAZolam  1 mg Oral QHS  . aspirin EC  81 mg Oral Daily  . busPIRone  10 mg Oral BID  . cycloSPORINE  1 drop Both Eyes BID  . diclofenac sodium  4 g Topical BID  . enoxaparin (LOVENOX) injection  40 mg Subcutaneous Q24H  . gabapentin  300 mg Oral QHS  . mometasone-formoterol  2 puff Inhalation BID  . pantoprazole  40 mg Oral Daily  . rifaximin  550 mg Oral BID  . saccharomyces boulardii  250 mg Oral BID  . simvastatin  20 mg Oral q1800  . sodium chloride  3 mL Intravenous Q12H   Continuous Infusions: . sodium chloride    . sodium chloride 125 mL/hr at 08/18/13 1344    Principal Problem:   Hypotension Active Problems:   CORONARY ARTERY DISEASE   ASTHMA   Diarrhea   BREAST CANCER, HX OF   IBS (irritable bowel syndrome)   Acute kidney injury   Pre-syncope    Time spent: 30 min    Jearlean Demauro, [facility]  [facility] Pager [phone]. If 7PM-7AM, please contact night-coverage at [email or web address], password [facility] 08/18/2013, 4:03 PM  LOS: 1 day

## 2013-08-18 NOTE — H&P (Signed)
[facility] History and Physical  [name] ZOX:[id number] DOB: 26-Sep-1950 DOA: 08/17/2013  Referring physician:  EDP PCP: No PCP Per Patient  Specialists:   Chief Complaint: Lightheaded  HPI: [name] is a 62 y.o. female who presented to the ED with complaints of lightheadedness and feeling as if she would faint.   She reports that she fell asleep but awakened at about 9 PM because she felt sick, and then she got up felt dizzy and almost passed out.   She was brought to the ED for further evaluation.  She reports having 12 episodes of diarrhea the day before, and she saw her GI doctor yesterday,  Dr. Jarold Motto.  She reports that the diarrhea resolved.  In the ED she was evaluated and was found to have a systolic blood pressure of 60, and she was administered 2 liters of fluid and her blood pressure improved, and she was referred for medical admission.    Review of Systems: The patient denies anorexia, fever, chills, headaches, weight loss, vision loss, diplopia, dizziness, decreased hearing, rhinitis, hoarseness, chest pain, syncope, dyspnea on exertion, peripheral edema, balance deficits, cough, hemoptysis, abdominal pain, nausea, vomiting, constipation, hematemesis, melena, hematochezia, severe indigestion/heartburn, dysuria, hematuria, incontinence, suspicious skin lesions, transient blindness, difficulty walking, depression, unusual weight change, abnormal bleeding, enlarged lymph nodes, angioedema, and breast masses.    Past Medical History  Diagnosis Date  . Myocardial infarct 2005  . CAD (coronary artery disease)   . Hypertension   . Long term (current) use of anticoagulants   . Edema   . Hematoma   . Sinusitis acute   . Adenocarcinoma of breast     right  . Acute upper respiratory infections of unspecified site   . Depression   . Anal fissure   . GERD (gastroesophageal reflux disease)   . Dog bite(E906.0)   . Diverticulosis of colon (without mention of  hemorrhage)   . Spinal stenosis, lumbar region, without neurogenic claudication   . Anxiety   . Panic attacks   . Asthma   . Irritable bowel syndrome   . Hiatal hernia   . Jaundice     Hx of Jaundice at age 59 from "dirty restuarant". Unsure of Hepatitis type    Past Surgical History  Procedure Laterality Date  . Partial hysterectomy  1987  . Cardiac catheterization  2001  . Coronary angioplasty with stent placement  2001  . Cholecystectomy    . Bladder repair      tact  . Breast lumpectomy Right   . Breast reconstruction Right   . Breast reduction surgery Left   . Colonoscopy    . Cataract extraction    . Esophageal manometry  10/08/2012    Procedure: ESOPHAGEAL MANOMETRY (EM);  Surgeon: Mardella Layman, MD;  Location: [facility];  Service: Endoscopy;  Laterality: N/A;    Prior to Admission medications   Medication Sig Start Date End Date Taking? Authorizing Provider  acetaminophen (TYLENOL) 500 MG tablet Take 500 mg by mouth every 8 (eight) hours as needed (pain).    Yes Historical Provider, MD  ALPRAZolam Prudy Feeler) 1 MG tablet Take 0.5-1 mg by mouth 2 (two) times daily as needed for anxiety or sleep.    Yes Historical Provider, MD  ascorbic acid (VITAMIN C) 1000 MG tablet Take 1,000 mg by mouth daily.   Yes Historical Provider, MD  aspirin 81 MG tablet Take 81 mg by mouth daily.     Yes Historical Provider, MD  busPIRone (BUSPAR) 10 MG tablet Take 10 mg by mouth 2 (two) times daily.    Yes Historical Provider, MD  Calcium Carbonate-Vitamin D (CALCIUM-CARB 600 + D) 600-125 MG-UNIT TABS Take 1 capsule by mouth 2 (two) times daily.    Yes Historical Provider, MD  Camphor-Menthol-Methyl Sal (SALONPAS) 1.2-5.7-6.3 % PTCH Apply 3 patches topically daily. Removes patches at night   Yes Historical Provider, MD  cyclobenzaprine (FLEXERIL) 5 MG tablet  07/30/13  Yes Historical Provider, MD  cycloSPORINE (RESTASIS) 0.05 % ophthalmic emulsion Place 1 drop into both eyes 2 (two) times  daily.     Yes Historical Provider, MD  furosemide (LASIX) 40 MG tablet Take 1 tablet (40 mg total) by mouth daily. 01/02/13  Yes Gaylord Shih, MD  gabapentin (NEURONTIN) 300 MG capsule Take 300-900 mg by mouth at bedtime. Take 1 cap at bedtime then 2 caps times 7 days then 3 caps qhs 08/12/13  Yes Historical Provider, MD  Linaclotide (LINZESS) 290 MCG CAPS capsule Take 1 capsule (290 mcg total) by mouth daily. 08/16/13  Yes Mardella Layman, MD  methocarbamol (ROBAXIN) 500 MG tablet Take 2 tablets (1,000 mg total) by mouth 4 (four) times daily as needed (Pain). 07/28/13  Yes Nicole Pisciotta, PA-C  metoprolol (LOPRESSOR) 50 MG tablet Take 1 tablet (50 mg total) by mouth 2 (two) times daily. 11/20/12  Yes Gaylord Shih, MD  pantoprazole (PROTONIX) 40 MG tablet Take 40 mg by mouth daily.  07/24/13  Yes Historical Provider, MD  pravastatin (PRAVACHOL) 40 MG tablet Take 40 mg by mouth every evening.  06/11/13  Yes Historical Provider, MD  Probiotic Product (PROBIOTIC DAILY) CAPS Take 1 capsule by mouth daily.   Yes Historical Provider, MD  rifaximin (XIFAXAN) 550 MG TABS tablet Take 1 tablet (550 mg total) by mouth 2 (two) times daily. 08/16/13  Yes Mardella Layman, MD  sodium chloride (OCEAN) 0.65 % nasal spray Place 1 spray into the nose as needed for congestion.   Yes Historical Provider, MD  valsartan (DIOVAN) 160 MG tablet Take 160 mg by mouth 2 (two) times daily.  09/24/12  Yes Gaylord Shih, MD  vitamin E (VITAMIN E) 400 UNIT capsule Take 400 Units by mouth daily.   Yes Historical Provider, MD  VOLTAREN 1 % GEL Apply 2 g topically 2 (two) times daily as needed (pain).  06/30/13  Yes Historical Provider, MD    Allergies  Allergen Reactions  . Amoxicillin Swelling    Throat Swells  . Azithromycin Swelling    Throat Swelling  . Bromfed Swelling    Throat Swelling  . Cephalexin Swelling    Throat Swelling  . Chlordiazepoxide-Clidinium Swelling    Throat Swelling  . Clotrimazole Other (See  Comments) and Hypertension    Patient told me that she couldn't swallow due to the medication  . Dicyclomine Hcl Hives  . Hydralazine Hcl     Rash and itching  . Ibuprofen     N/V  . Iohexol      Code: HIVES, Desc: throat swelling no hives 20 yrs ago;needs pre-medication  09/19/07 sg, Onset Date: [id number]   . Latex Swelling    Blisters on Skin  . Lidocaine Hives  . Paroxetine Swelling    Throat Swelling  . Penicillins Hives  . Propoxyphene-Acetaminophen Swelling    REACTION: swelling in the throat  . Sertraline Hcl Swelling    Throat Swelling  . Sulfadiazine Swelling    Throat Swelling  . Verapamil Swelling  Throat Swelling    Social History:  reports that she quit smoking about 29 years ago. Her smoking use included Cigarettes. She has a 2.4 pack-year smoking history. She has never used smokeless tobacco. She reports that she does not drink alcohol or use illicit drugs.     Family History  Problem Relation Age of Onset  . Hypertension Mother   . Heart disease Mother   . Dementia Mother   . Arthritis Mother   . Diabetes Mother   . Prostate cancer Father   . Lung cancer Maternal Uncle   . Hypertension Father   . Hypertension Sister   . Hypertension Brother   . Heart disease Sister   . Rectal cancer Neg Hx   . Stomach cancer Neg Hx   . Colon cancer Cousin   . Colon polyps Mother   . Colon polyps Father   . Inflammatory bowel disease Sister      Physical Exam:  GEN:  Pleasant Obese  62 y.o. Caucasian female  examined  and in no acute distress; cooperative with exam Filed Vitals:   08/18/13 0142 08/18/13 0145 08/18/13 0148 08/18/13 0150  BP: 112/60 123/72 132/80   Pulse: 75 81 80   Temp:    97.8 F (36.6 C)  TempSrc:    Oral  Resp:    18  SpO2:       Blood pressure 132/80, pulse 80, temperature 97.8 F (36.6 C), temperature source Oral, resp. rate 18, SpO2 95.00%. PSYCH: She is alert and oriented x4; does not appear anxious does not appear depressed;  affect is normal HEENT: Normocephalic and Atraumatic, Mucous membranes pink; PERRLA; EOM intact; Fundi:  Benign;  No scleral icterus, Nares: Patent, Oropharynx: Clear, Fair Dentition, Neck:  FROM, no cervical lymphadenopathy nor thyromegaly or carotid bruit; no JVD; Breasts:: Not examined CHEST WALL: No tenderness CHEST: Normal respiration, clear to auscultation bilaterally HEART: Regular rate and rhythm; no murmurs rubs or gallops BACK: No kyphosis or scoliosis; no CVA tenderness ABDOMEN: Positive Bowel Sounds,  Obese, soft non-tender; no masses, no organomegaly. Rectal Exam: Not done EXTREMITIES: No cyanosis, clubbing or edema; no ulcerations. Genitalia: not examined PULSES: 2+ and symmetric SKIN: Normal hydration no rash or ulceration CNS: Cranial nerves 2-12 grossly intact no focal neurologic deficit    Labs on Admission:  Basic Metabolic Panel:  Recent Labs Lab 08/17/13 2310  NA 135  K 3.2*  CL 99  CO2 27  GLUCOSE 178*  BUN 18  CREATININE 1.34*  CALCIUM 9.5   Liver Function Tests: No results found for this basename: AST, ALT, ALKPHOS, BILITOT, PROT, ALBUMIN,  in the last 168 hours No results found for this basename: LIPASE, AMYLASE,  in the last 168 hours No results found for this basename: AMMONIA,  in the last 168 hours CBC:  Recent Labs Lab 08/17/13 2310  WBC 5.4  HGB 12.1  HCT 35.6*  MCV 89.0  PLT 196   Cardiac Enzymes: No results found for this basename: CKTOTAL, CKMB, CKMBINDEX, TROPONINI,  in the last 168 hours  BNP (last 3 results) No results found for this basename: PROBNP,  in the last 8760 hours CBG:  Recent Labs Lab 08/17/13 2315  GLUCAP 148*    Radiological Exams on Admission: Dg Chest 2 View  08/18/2013   CLINICAL DATA:  Shortness of breath and cough.  EXAM: CHEST  2 VIEW  COMPARISON:  03/05/2011  FINDINGS: Two views of the chest were obtained. There is no evidence for airspace disease.  Heart and mediastinum are within normal limits.  Density at the left costophrenic angle but no definite pleural fluid based on the lateral view.  IMPRESSION: No acute chest findings.   Electronically Signed   By: Richarda Overlie M.D.   On: 08/18/2013 00:51   Ct Head (brain) Wo Contrast  08/18/2013   CLINICAL DATA:  Presyncope; hypotension.  EXAM: CT HEAD WITHOUT CONTRAST  TECHNIQUE: Contiguous axial images were obtained from the base of the skull through the vertex without intravenous contrast.  COMPARISON:  MRI of the brain performed 02/26/2009, and maxillofacial CT performed 07/17/2010  FINDINGS: There is no evidence of acute infarction, mass lesion, or intra- or extra-axial hemorrhage on CT.  Mild periventricular white matter change likely reflects small vessel ischemic microangiopathy.  The posterior fossa, including the cerebellum, brainstem and fourth ventricle, is within normal limits. The third and lateral ventricles, and basal ganglia are unremarkable in appearance. The cerebral hemispheres demonstrate grossly normal gray-white differentiation. No mass effect or midline shift is seen.  There is no evidence of fracture; visualized osseous structures are unremarkable in appearance. The visualized portions of the orbits are within normal limits. The paranasal sinuses and mastoid air cells are well-aerated. No significant soft tissue abnormalities are seen.  IMPRESSION: 1. No acute intracranial pathology seen on CT. 2. Mild small vessel ischemic microangiopathy.   Electronically Signed   By: Roanna Raider M.D.   On: 08/18/2013 00:48     EKG: Independently reviewed.   Normal Sinus Rhythm No Acute S-T changes   Assessment/Plan Principal Problem:   Hypotension Active Problems:   Pre-syncope   Acute kidney injury   CORONARY ARTERY DISEASE   ASTHMA   Diarrhea   BREAST CANCER, HX OF   IBS (irritable bowel syndrome)     1.  Hypotension- due to volume loss from previous diarrhea.  IVFs for Rehydration.    2.  Presyncope- due to #1  3.  AKI-  Due to volume loss,  IVFs, and Monitor BUN/Cr.    4.  Diarrhea-  Due to IBS,  Resolved.    5.  CAD- stable.    6.  Asthma- stable.    7.  Breast Cancer-  History.    8. IBS- stable.          Code Status:   FULL CODE Family Communication:    No Family at Bedside Disposition Plan:       Inpatient   Time spent:   67 Minutes  Ron Parker [facility] Pager [phone]  If 7PM-7AM, please contact night-coverage [email or web address] Password [email or web address] 08/18/2013, 4:04 AM

## 2013-08-18 NOTE — Progress Notes (Signed)
Patient feeling much better.  Denies recent vomiting and diarrhea, last BM was soft and yesterday AM.  Lightheadedness resolved.  Labs improved.  May be able to discharge later today if continuing to feel well and is tolerating lunch.

## 2013-08-19 DIAGNOSIS — J189 Pneumonia, unspecified organism: Secondary | ICD-10-CM

## 2013-08-19 DIAGNOSIS — R197 Diarrhea, unspecified: Secondary | ICD-10-CM

## 2013-08-19 DIAGNOSIS — D649 Anemia, unspecified: Secondary | ICD-10-CM

## 2013-08-19 MED ORDER — GABAPENTIN 300 MG PO CAPS: 300.0000 mg | ORAL_CAPSULE | Freq: Every day | ORAL | Status: DC

## 2013-08-19 MED ORDER — FLUTICASONE-SALMETEROL 250-50 MCG/DOSE IN AEPB: 1.0000 | INHALATION_SPRAY | Freq: Two times a day (BID) | RESPIRATORY_TRACT | Status: DC

## 2013-08-19 MED ORDER — LORAZEPAM 1 MG PO TABS: 1.0000 mg | ORAL_TABLET | Freq: Once | ORAL | Status: DC

## 2013-08-19 MED ORDER — LEVALBUTEROL HCL 0.63 MG/3ML IN NEBU
0.6300 mg | INHALATION_SOLUTION | Freq: Three times a day (TID) | RESPIRATORY_TRACT | Status: DC
  Filled 2013-08-19: qty 3

## 2013-08-19 MED ORDER — DOXYCYCLINE HYCLATE 100 MG PO TABS: 100.0000 mg | ORAL_TABLET | Freq: Two times a day (BID) | ORAL | Status: DC

## 2013-08-19 MED ORDER — LEVALBUTEROL HCL 0.63 MG/3ML IN NEBU
0.6300 mg | INHALATION_SOLUTION | Freq: Three times a day (TID) | RESPIRATORY_TRACT | Status: DC
  Administered 2013-08-19: 0.63 mg via RESPIRATORY_TRACT
  Filled 2013-08-19: qty 3

## 2013-08-19 MED ORDER — FUROSEMIDE 40 MG PO TABS: 40.0000 mg | ORAL_TABLET | Freq: Every day | ORAL | Status: DC | PRN

## 2013-08-19 MED ORDER — METOPROLOL TARTRATE 50 MG PO TABS
50.0000 mg | ORAL_TABLET | Freq: Two times a day (BID) | ORAL | Status: DC
  Administered 2013-08-19: 50 mg via ORAL
  Filled 2013-08-19 (×2): qty 1

## 2013-08-19 MED ORDER — IPRATROPIUM-ALBUTEROL 20-100 MCG/ACT IN AERS: 1.0000 | INHALATION_SPRAY | Freq: Four times a day (QID) | RESPIRATORY_TRACT | Status: DC | PRN

## 2013-08-19 NOTE — Progress Notes (Signed)
Follow up appointment 08/20/13 at 4:15 PM with [facility].

## 2013-08-19 NOTE — Discharge Summary (Addendum)
Physician Discharge Summary  [name] ZOX:[id number] DOB: 13-Feb-1951 DOA: 08/17/2013  PCP: No PCP Per Patient  Admit date: 08/17/2013 Discharge date: 08/19/2013  Recommendations for Outpatient Follow-up:  1. Primary care doctor in one week to followup on pneumonia and asthma. Repeat CBC to check blood count and anemia. Repeat BMP to check creatinine. Blood pressure evaluation.  Please check a hemoglobin A1c if not done recently. 2. Changed Lasix to as needed, restarted Advair, given prescription for Combivent, doxycycline started.  Discharge Diagnoses:  Principal Problem:   Hypotension Active Problems:   CORONARY ARTERY DISEASE   ASTHMA   Diarrhea   BREAST CANCER, HX OF   IBS (irritable bowel syndrome)   Acute kidney injury   Pre-syncope   CAP (community acquired pneumonia)   Normocytic anemia   Leukopenia   Hypokalemia   Discharge Condition: Stable, improved  Diet recommendation: Healthy heart  Wt Readings from Last 3 Encounters:  08/18/13 81.8 kg (180 lb 5.4 oz)  08/16/13 80.922 kg (178 lb 6.4 oz)  07/19/13 79.47 kg (175 lb 3.2 oz)    History of present illness:  [name] is a 62 y.o. female who presented to the ED with complaints of lightheadedness and feeling as if she would faint. She reports that she fell asleep but awakened at about 9 PM because she felt sick, and then she got up felt dizzy and almost passed out. She was brought to the ED for further evaluation. She reports having 12 episodes of diarrhea the day before, and she saw her GI doctor yesterday, Dr. Jarold Motto. She reports that the diarrhea resolved. In the ED she was evaluated and was found to have a systolic blood pressure of 60, and she was administered 2 liters of fluid and her blood pressure improved, and she was referred for medical admission   Hospital Course:   Hypotension, likely due to dehydration from severe diarrhea. She was given IV fluids and her blood pressure returned to normal and  then started to rise. Her metoprolol was restarted.  Her valsartan may be restarted at the time of discharge. Because of her severe dehydration her Lasix which was previously taken once daily was changed to once daily as needed for swelling, weight gain of 3 pounds in one day, or weight gain of 5 pounds in one week. She will need a blood pressure check in approximately one week by her primary care doctor.  Chest pain, started to resolve after initiation of treatment for asthma and pneumonia. Her troponins were negative. Her telemetry demonstrated normal sinus rhythm. Her chest pain was sharp and pleuritic mostly located in the right lateral chest, low suspicion for cardiac etiology. She may followup with her primary care Doctor or cardiologist if she continues to have chest pain.  Community acquired pneumonia, with persistent coarse rales at the left base and a patchy left lower lobe opacity on x-ray. She was started on doxycycline which she should continue for an additional 7 days. She remained stable on room air.  Wheezing, likely due to chronic asthma.  She did not have evidence of heart failure on her chest x-ray was hypovolemic at the time. She states that she stopped her Advair proximally 2 months ago because she states that her asthma has been well-controlled. Instead she been using vinegar daily to control her asthma symptoms. Advised her to restart her Advair and gave her a new prescription for home. I also gave her a prescription for Combivent inhaler to use when necessary.  She should followup with her primary care doctor for further advice about asthma management.  Acute kidney injury, resolved with IV fluids. Her creatinine immediately trended down from 1.24-0.83. She should have a repeat BMP done in approximately one week to verify that her creatinine is stable.  Hypokalemia, likely due to severe dehydration. Resolved with oral and IV supplementation. She should have a repeat BMP done to  verify that her potassium is still normal in approximately one week.  Diarrhea, likely due to the bowel syndrome which is a chronic problem versus viral illness. She did not have any fevers. Her IBS may been exacerbated by an underlying pneumonia. Her diarrhea resolved and she was able to restart her home medications.  Anxiety and depression, stable. She continue BuSpar and Xanax.  Chronic back pain, followed by pain clinic. She is anticipating getting an injection again this year for her pain and in the meantime is taking Flexeril and gabapentin.  Leukopenia, was likely secondary to hemodilution or sequestration secondary to her pneumonia. She has never repeat CBC done in approximately one week.  Normocytic anemia, likely with hemodilutional.  Again repeat CBC in approximately one week to verify normalization, and if her anemia persists, recommend further anemia workup if not already complete.  Mild hyperglycemia in the setting of acute illness. This may be a stress response due to 2 severity and hypotension and normal, however, recommend screening for diabetes potentially with a hemoglobin A1c as an outpatient.  Consultants:  None Procedures:  CT brain  CXR  Antibiotics:  Doxycycline 12/7 >>   Discharge Exam: Filed Vitals:   08/19/13 0836  BP:   Pulse: 77  Temp:   Resp: 18   Filed Vitals:   08/19/13 0227 08/19/13 0537 08/19/13 0756 08/19/13 0836  BP: 167/83 152/83 142/86   Pulse: 101 99  77  Temp: 97.6 F (36.4 C) 97.6 F (36.4 C)    TempSrc: Oral Oral    Resp: 20 20  18   Height:      Weight:      SpO2: 100% 100%  100%    General: CF, No acute distress  HEENT: NCAT, MMM  Cardiovascular: RRR, nl S1, S2 no mrg, 2+ pulses, warm extremities  Respiratory: Diminished bilateral breath sounds with rhonchi, focal rales at the left base, wheezing has resolved, no increased WOB  Abdomen: NABS, soft, mildly TTP diffusely without rebound or guarding. nondistended  MSK: Normal  tone and bulk, no LEE  Neuro: Grossly intact   Discharge Instructions      Discharge Orders   Future Appointments Provider Department Dept Phone   08/30/2013 9:45 AM Mardella Layman, MD [facility] [phone]   01/10/2014 10:30 AM Krista [facility] MEDICAL ONCOLOGY [phone]   01/10/2014 11:00 AM Victorino December, MD [facility] MEDICAL ONCOLOGY [phone]   Future Orders Complete By Expires   (HEART FAILURE PATIENTS) Call MD:  Anytime you have any of the following symptoms: 1) 3 pound weight gain in 24 hours or 5 pounds in 1 week 2) shortness of breath, with or without a dry hacking cough 3) swelling in the hands, feet or stomach 4) if you have to sleep on extra pillows at night in order to breathe.  As directed    Call MD for:  difficulty breathing, headache or visual disturbances  As directed    Call MD for:  extreme fatigue  As directed    Call MD for:  hives  As directed    Call MD for:  persistant dizziness or light-headedness  As directed    Call MD for:  persistant nausea and vomiting  As directed    Call MD for:  severe uncontrolled pain  As directed    Call MD for:  temperature >100.4  As directed    Diet - low sodium heart healthy  As directed    Discharge instructions  As directed    Comments:     You were hospitalized with dehydration from your IBS, low blood pressure, and pneumonia.  You also developed some mild asthma symptoms.  You may resume your IBS medications and take imodium as needed for diarrhea.  For your pneumonia, please take doxycycline twice a day until gone, your next dose is tonight.  Restart your advair to prevent asthma exacerbation and use combivent inhaler as needed for wheezing.  Please see your doctor in 1 week to make sure your blood pressure, blood counts, kidney function, and potassium are okay and to follow up on how your asthma and pneumonia are doing.  Please return to the hospital if  you have increasing shortness of breath, wheezing, fevers, or severe diarrhea causing dehydration.  Your lasix may have contributed to your dehydration.  Please take your lasix daily only as needed for swelling, weight gain of 3-lbs in one day, or weight gain ofi 5-lbs in one week.  Talk to your primary care doctor about this medication also.   Increase activity slowly  As directed        Medication List    STOP taking these medications       methocarbamol 500 MG tablet  Commonly known as:  ROBAXIN      TAKE these medications       acetaminophen 500 MG tablet  Commonly known as:  TYLENOL  Take 500 mg by mouth every 8 (eight) hours as needed (pain).     ALPRAZolam 1 MG tablet  Commonly known as:  XANAX  Take 0.5-1 mg by mouth 2 (two) times daily as needed for anxiety or sleep.     ascorbic acid 1000 MG tablet  Commonly known as:  VITAMIN C  Take 1,000 mg by mouth daily.     aspirin 81 MG tablet  Take 81 mg by mouth daily.     busPIRone 10 MG tablet  Commonly known as:  BUSPAR  Take 10 mg by mouth 2 (two) times daily.     CALCIUM-CARB 600 + D 600-125 MG-UNIT Tabs  Generic drug:  Calcium Carbonate-Vitamin D  Take 1 capsule by mouth 2 (two) times daily.     cyclobenzaprine 5 MG tablet  Commonly known as:  FLEXERIL     cycloSPORINE 0.05 % ophthalmic emulsion  Commonly known as:  RESTASIS  Place 1 drop into both eyes 2 (two) times daily.     DIOVAN 160 MG tablet  Generic drug:  valsartan  Take 160 mg by mouth 2 (two) times daily.     doxycycline 100 MG tablet  Commonly known as:  VIBRA-TABS  Take 1 tablet (100 mg total) by mouth every 12 (twelve) hours.     Fluticasone-Salmeterol 250-50 MCG/DOSE Aepb  Commonly known as:  ADVAIR  Inhale 1 puff into the lungs 2 (two) times daily.     furosemide 40 MG tablet  Commonly known as:  LASIX  Take 1 tablet (40 mg total) by mouth daily as needed for fluid or edema.     gabapentin 300  MG capsule  Commonly known as:   NEURONTIN  Take 1 capsule (300 mg total) by mouth at bedtime. Take 1 cap at bedtime then 2 caps times 7 days then 3 caps qhs     Ipratropium-Albuterol 20-100 MCG/ACT Aers respimat  Commonly known as:  COMBIVENT  Inhale 1 puff into the lungs every 6 (six) hours as needed for wheezing or shortness of breath.     Linaclotide 290 MCG Caps capsule  Commonly known as:  LINZESS  Take 1 capsule (290 mcg total) by mouth daily.     metoprolol 50 MG tablet  Commonly known as:  LOPRESSOR  Take 1 tablet (50 mg total) by mouth 2 (two) times daily.     pantoprazole 40 MG tablet  Commonly known as:  PROTONIX  Take 40 mg by mouth daily.     pravastatin 40 MG tablet  Commonly known as:  PRAVACHOL  Take 40 mg by mouth every evening.     PROBIOTIC DAILY Caps  Take 1 capsule by mouth daily.     rifaximin 550 MG Tabs tablet  Commonly known as:  XIFAXAN  Take 1 tablet (550 mg total) by mouth 2 (two) times daily.     SALONPAS 1.2-5.7-6.3 % Ptch  Generic drug:  Camphor-Menthol-Methyl Sal  Apply 3 patches topically daily. Removes patches at night     sodium chloride 0.65 % nasal spray  Commonly known as:  OCEAN  Place 1 spray into the nose as needed for congestion.     vitamin E 400 UNIT capsule  Generic drug:  vitamin E  Take 400 Units by mouth daily.     VOLTAREN 1 % Gel  Generic drug:  diclofenac sodium  Apply 2 g topically 2 (two) times daily as needed (pain).       Follow-up Information   Follow up with No PCP Per Patient. Schedule an appointment as soon as possible for a visit in 2 weeks.   Specialty:  General Practice   Contact information:   [address] [place] [address] [phone]        The results of significant diagnostics from this hospitalization (including imaging, microbiology, ancillary and laboratory) are listed below for reference.    Significant Diagnostic Studies: Dg Chest 2 View  08/18/2013   CLINICAL DATA:  Shortness of breath and cough.  EXAM:  CHEST  2 VIEW  COMPARISON:  03/05/2011  FINDINGS: Two views of the chest were obtained. There is no evidence for airspace disease. Heart and mediastinum are within normal limits. Density at the left costophrenic angle but no definite pleural fluid based on the lateral view.  IMPRESSION: No acute chest findings.   Electronically Signed   By: Richarda Overlie M.D.   On: 08/18/2013 00:51   Ct Head (brain) Wo Contrast  08/18/2013   CLINICAL DATA:  Presyncope; hypotension.  EXAM: CT HEAD WITHOUT CONTRAST  TECHNIQUE: Contiguous axial images were obtained from the base of the skull through the vertex without intravenous contrast.  COMPARISON:  MRI of the brain performed 02/26/2009, and maxillofacial CT performed 07/17/2010  FINDINGS: There is no evidence of acute infarction, mass lesion, or intra- or extra-axial hemorrhage on CT.  Mild periventricular white matter change likely reflects small vessel ischemic microangiopathy.  The posterior fossa, including the cerebellum, brainstem and fourth ventricle, is within normal limits. The third and lateral ventricles, and basal ganglia are unremarkable in appearance. The cerebral hemispheres demonstrate grossly normal gray-white differentiation. No mass effect or midline  shift is seen.  There is no evidence of fracture; visualized osseous structures are unremarkable in appearance. The visualized portions of the orbits are within normal limits. The paranasal sinuses and mastoid air cells are well-aerated. No significant soft tissue abnormalities are seen.  IMPRESSION: 1. No acute intracranial pathology seen on CT. 2. Mild small vessel ischemic microangiopathy.   Electronically Signed   By: Roanna Raider M.D.   On: 08/18/2013 00:48   Mr Lumbar Spine Wo Contrast  07/28/2013   CLINICAL DATA:  Low back and bilateral leg pain. Urinary incontinence.  EXAM: MRI LUMBAR SPINE WITHOUT CONTRAST  TECHNIQUE: Multiplanar, multisequence MR imaging was performed. No intravenous contrast was  administered.  COMPARISON:  MRI dated 03/14/2012  FINDINGS: Slightly low-lying but otherwise normal conus tip at L2-3. Normal paraspinal soft tissues.  T12-L1 through L2-3:  Normal.  L3-4: Tiny diffuse disc bulge with no neural impingement, unchanged.  L4-5: Tiny disc bulges into the neural foramina bilaterally, unchanged. Small effusions in the facet joints, also unchanged. No neural impingement.  L5-S1: Small broad-based disc bulge with a small annular tear at the level of the left neural foramen, unchanged. Disc bulges into both neural foramina without neural impingement, unchanged. Minimal degenerative changes of the facet joints, stable.  IMPRESSION: 1. No significant change in the appearance of the lumbar spine since the prior study of 03/14/2012. 2. No neural impingement or spinal stenosis. 3. Small annular tear at the level of the left neural foramen at L5-S1.   Electronically Signed   By: Geanie Cooley M.D.   On: 07/28/2013 17:58   Dg Chest Port 1 View  08/18/2013   CLINICAL DATA:  Shortness of breath, wheezing  EXAM: PORTABLE CHEST - 1 VIEW  COMPARISON:  08/17/2013  FINDINGS: Mild patchy right basilar opacity, likely atelectasis. Mild patchy left lower lobe opacity, atelectasis versus pneumonia. No pleural effusion or pneumothorax.  The heart is top-normal in size.  Cholecystectomy clips.  IMPRESSION: Mild patchy left lower lobe opacity, atelectasis versus pneumonia.  Mild patchy right basilar opacity, likely atelectasis   Electronically Signed   By: Charline Bills M.D.   On: 08/18/2013 14:47    Microbiology: No results found for this or any previous visit (from the past 240 hour(s)).   Labs: Basic Metabolic Panel:  Recent Labs Lab 08/17/13 2310 08/18/13 0526  NA 135 141  K 3.2* 3.7  CL 99 109  CO2 27 25  GLUCOSE 178* 142*  BUN 18 16  CREATININE 1.34* 0.83  CALCIUM 9.5 8.6   Liver Function Tests: No results found for this basename: AST, ALT, ALKPHOS, BILITOT, PROT, ALBUMIN,  in  the last 168 hours No results found for this basename: LIPASE, AMYLASE,  in the last 168 hours No results found for this basename: AMMONIA,  in the last 168 hours CBC:  Recent Labs Lab 08/17/13 2310 08/18/13 0526  WBC 5.4 3.5*  HGB 12.1 11.0*  HCT 35.6* 32.3*  MCV 89.0 89.7  PLT 196 157   Cardiac Enzymes: No results found for this basename: CKTOTAL, CKMB, CKMBINDEX, TROPONINI,  in the last 168 hours BNP: BNP (last 3 results) No results found for this basename: PROBNP,  in the last 8760 hours CBG:  Recent Labs Lab 08/17/13 2315 08/18/13 0749 08/18/13 1216 08/18/13 1650  GLUCAP 148* 138* 92 87    Time coordinating discharge: 45 minutes  Signed:  Harnoor Reta  [facility] 08/19/2013, 10:16 AM

## 2013-08-20 ENCOUNTER — Ambulatory Visit: Payer: Medicare Other | Attending: Internal Medicine | Admitting: Internal Medicine

## 2013-08-20 ENCOUNTER — Encounter: Payer: Self-pay | Admitting: Internal Medicine

## 2013-08-20 VITALS — BP 166/100 | HR 90 | Temp 98.9°F | Resp 16 | Ht 63.0 in | Wt 177.0 lb

## 2013-08-20 DIAGNOSIS — J189 Pneumonia, unspecified organism: Secondary | ICD-10-CM

## 2013-08-20 DIAGNOSIS — F411 Generalized anxiety disorder: Secondary | ICD-10-CM

## 2013-08-20 DIAGNOSIS — I10 Essential (primary) hypertension: Secondary | ICD-10-CM | POA: Diagnosis not present

## 2013-08-20 DIAGNOSIS — N179 Acute kidney failure, unspecified: Secondary | ICD-10-CM

## 2013-08-20 DIAGNOSIS — J45909 Unspecified asthma, uncomplicated: Secondary | ICD-10-CM | POA: Diagnosis not present

## 2013-08-20 DIAGNOSIS — K219 Gastro-esophageal reflux disease without esophagitis: Secondary | ICD-10-CM

## 2013-08-20 DIAGNOSIS — D649 Anemia, unspecified: Secondary | ICD-10-CM

## 2013-08-20 DIAGNOSIS — F329 Major depressive disorder, single episode, unspecified: Secondary | ICD-10-CM

## 2013-08-20 DIAGNOSIS — Z0000 Encounter for general adult medical examination without abnormal findings: Secondary | ICD-10-CM | POA: Diagnosis not present

## 2013-08-20 MED ORDER — ALPRAZOLAM 1 MG PO TABS: 0.5000 mg | ORAL_TABLET | Freq: Two times a day (BID) | ORAL | Status: DC | PRN

## 2013-08-20 NOTE — Addendum Note (Signed)
Addended by: Alison Murray on: 08/20/2013 05:49 PM   Modules accepted: Orders

## 2013-08-20 NOTE — Progress Notes (Signed)
HFU. Wheezing Shortness of breath

## 2013-08-20 NOTE — Patient Instructions (Signed)
Hypertension As your heart beats, it forces blood through your arteries. This force is your blood pressure. If the pressure is too high, it is called hypertension (HTN) or high blood pressure. HTN is dangerous because you may have it and not know it. High blood pressure may mean that your heart has to work harder to pump blood. Your arteries may be narrow or stiff. The extra work puts you at risk for heart disease, stroke, and other problems.  Blood pressure consists of two numbers, a higher number over a lower, 110/72, for example. It is stated as "110 over 72." The ideal is below 120 for the top number (systolic) and under 80 for the bottom (diastolic). Write down your blood pressure today. You should pay close attention to your blood pressure if you have certain conditions such as:  Heart failure.  Prior heart attack.  Diabetes  Chronic kidney disease.  Prior stroke.  Multiple risk factors for heart disease. To see if you have HTN, your blood pressure should be measured while you are seated with your arm held at the level of the heart. It should be measured at least twice. A one-time elevated blood pressure reading (especially in the Emergency Department) does not mean that you need treatment. There may be conditions in which the blood pressure is different between your right and left arms. It is important to see your caregiver soon for a recheck. Most people have essential hypertension which means that there is not a specific cause. This type of high blood pressure may be lowered by changing lifestyle factors such as:  Stress.  Smoking.  Lack of exercise.  Excessive weight.  Drug/tobacco/alcohol use.  Eating less salt. Most people do not have symptoms from high blood pressure until it has caused damage to the body. Effective treatment can often prevent, delay or reduce that damage. TREATMENT  When a cause has been identified, treatment for high blood pressure is directed at the  cause. There are a large number of medications to treat HTN. These fall into several categories, and your caregiver will help you select the medicines that are best for you. Medications may have side effects. You should review side effects with your caregiver. If your blood pressure stays high after you have made lifestyle changes or started on medicines,   Your medication(s) may need to be changed.  Other problems may need to be addressed.  Be certain you understand your prescriptions, and know how and when to take your medicine.  Be sure to follow up with your caregiver within the time frame advised (usually within two weeks) to have your blood pressure rechecked and to review your medications.  If you are taking more than one medicine to lower your blood pressure, make sure you know how and at what times they should be taken. Taking two medicines at the same time can result in blood pressure that is too low. SEEK IMMEDIATE MEDICAL CARE IF:  You develop a severe headache, blurred or changing vision, or confusion.  You have unusual weakness or numbness, or a faint feeling.  You have severe chest or abdominal pain, vomiting, or breathing problems. MAKE SURE YOU:   Understand these instructions.  Will watch your condition.  Will get help right away if you are not doing well or get worse. Document Released: 08/29/2005 Document Revised: 11/21/2011 Document Reviewed: 04/18/2008 [facility] Patient Information 2014 [place]. Hypotension As your heart beats, it forces blood through your arteries. This force is your blood  pressure. If your blood pressure is too low for you to go about your normal activities or support the organs of your body, you have hypotension, or low blood pressure. When your blood pressure becomes too low, you may not get enough blood to your brain, and you may feel weak, lightheaded, or develop a more rapid heart rate. In a more severe case, you may faint: this is a  sudden, brief loss of consciousness where you pass out and recover completely. CAUSES  Loss of blood or fluids from the body. This occurs during rapid blood loss. It can also come from dehydration when the body is not taking in enough fluids or is losing fluids faster than they can be replaced. Examples of this would be severe vomiting and diarrhea.  Not taking in enough fluids and salts. This is common in the elderly where thirst mechanisms are not working as well. This means you do not feel thirsty and you do not take in enough water.  Use of blood pressure pills and other medications that may lower the blood pressure below normal.  Over medication (always take your medications as directed).  Irregular heart beat or heart failure when the heart is no longer working well enough to support blood pressure. Hospitalization is sometimes required for low blood pressure if fluid or blood replacement is needed, if time is needed for medications to wear off, or if further evaluation is needed. Less common causes of low blood pressure might include peripheral or autonomic neuropathy (nerve problems), Parkinson's disease, or other illnesses. Treatment might include a change in diet, change in medications (including medicines aimed at raising your blood pressure), and use of support stockings. HOME CARE INSTRUCTIONS   Maintain good fluid intake and use a little more salt on your food (if you are not on a restricted diet or having problems with your heart such as heart failure). This is especially important for the elderly when you may not feel thirsty in the winter.  Take your medications as directed.  Get up slowly from reclining or sitting positions. This gives your blood pressure a chance to adjust. As we grow older our ability to regulate our blood pressure may not be as good as when we were younger.  Wear support stockings if prescribed.  Use walkers, canes, etc., if advised.  Talk with your  physician or nurse about a Home Safety Evaluation (usually done by visiting nurses). SEEK IMMEDIATE MEDICAL CARE IF:   You have a fainting episode. Do not drive yourself. Call 911 if no other help is available.  You have chest pain, nausea (feeling sick to your stomach) or vomiting.  You have a loss of feeling in some part of your body, or lose movement in your arms or legs.  You have difficulty with speech.  You become sweaty and/or feel light headed. Make sure you are re-checked as instructed. MAKE SURE YOU:   Understand these instructions.  Will watch your condition.  Will get help right away if you are not doing well or get worse. Document Released: 08/29/2005 Document Revised: 11/21/2011 Document Reviewed: 03/01/2013 [facility] Patient Information 2014 [place], [place].

## 2013-08-20 NOTE — Progress Notes (Signed)
Patient ID: [name], female   DOB: 1950-10-27, 62 y.o.   MRN: [id number]  CC: follow up for recent hospitalization  HPI: 62 year old female who has multiple medical comorbidities listed below presented to clinic for followup after recent hospitalization for pneumonia. At this time patient has no complaints of shortness of breath and reports feeling better. She still takes doxycycline.  Allergies  Allergen Reactions  . Amoxicillin Swelling    Throat Swells  . Azithromycin Swelling    Throat Swelling  . Bromfed Swelling    Throat Swelling  . Cephalexin Swelling    Throat Swelling  . Chlordiazepoxide-Clidinium Swelling    Throat Swelling  . Clotrimazole Other (See Comments) and Hypertension    Patient told me that she couldn't swallow due to the medication  . Dicyclomine Hcl Hives  . Hydralazine Hcl     Rash and itching  . Ibuprofen     N/V  . Iohexol      Code: HIVES, Desc: throat swelling no hives 20 yrs ago;needs pre-medication  09/19/07 sg, Onset Date: [id number]   . Latex Swelling    Blisters on Skin  . Lidocaine Hives  . Paroxetine Swelling    Throat Swelling  . Penicillins Hives  . Propoxyphene-Acetaminophen Swelling    REACTION: swelling in the throat  . Sertraline Hcl Swelling    Throat Swelling  . Sulfadiazine Swelling    Throat Swelling  . Verapamil Swelling    Throat Swelling   Past Medical History  Diagnosis Date  . Myocardial infarct 2005  . CAD (coronary artery disease)   . Hypertension   . Long term (current) use of anticoagulants   . Edema   . Hematoma   . Sinusitis acute   . Adenocarcinoma of breast     right  . Acute upper respiratory infections of unspecified site   . Depression   . Anal fissure   . GERD (gastroesophageal reflux disease)   . Dog bite(E906.0)   . Diverticulosis of colon (without mention of hemorrhage)   . Spinal stenosis, lumbar region, without neurogenic claudication   . Anxiety   . Panic attacks   . Asthma   .  Irritable bowel syndrome   . Hiatal hernia   . Jaundice     Hx of Jaundice at age 32 from "dirty restuarant". Unsure of Hepatitis type   Current Outpatient Prescriptions on File Prior to Visit  Medication Sig Dispense Refill  . acetaminophen (TYLENOL) 500 MG tablet Take 500 mg by mouth every 8 (eight) hours as needed (pain).       Marland Kitchen ALPRAZolam (XANAX) 1 MG tablet Take 0.5-1 mg by mouth 2 (two) times daily as needed for anxiety or sleep.       Marland Kitchen ascorbic acid (VITAMIN C) 1000 MG tablet Take 1,000 mg by mouth daily.      Marland Kitchen aspirin 81 MG tablet Take 81 mg by mouth daily.        . busPIRone (BUSPAR) 10 MG tablet Take 10 mg by mouth 2 (two) times daily.       . Calcium Carbonate-Vitamin D (CALCIUM-CARB 600 + D) 600-125 MG-UNIT TABS Take 1 capsule by mouth 2 (two) times daily.       . Camphor-Menthol-Methyl Sal (SALONPAS) 1.2-5.7-6.3 % PTCH Apply 3 patches topically daily. Removes patches at night      . cyclobenzaprine (FLEXERIL) 5 MG tablet       . cycloSPORINE (RESTASIS) 0.05 % ophthalmic emulsion Place 1 drop into both  eyes 2 (two) times daily.        Marland Kitchen doxycycline (VIBRA-TABS) 100 MG tablet Take 1 tablet (100 mg total) by mouth every 12 (twelve) hours.  14 tablet  0  . Fluticasone-Salmeterol (ADVAIR) 250-50 MCG/DOSE AEPB Inhale 1 puff into the lungs 2 (two) times daily.  60 each  0  . furosemide (LASIX) 40 MG tablet Take 1 tablet (40 mg total) by mouth daily as needed for fluid or edema.  30 tablet  0  . gabapentin (NEURONTIN) 300 MG capsule Take 1 capsule (300 mg total) by mouth at bedtime. Take 1 cap at bedtime then 2 caps times 7 days then 3 caps qhs  30 capsule  0  . Ipratropium-Albuterol (COMBIVENT) 20-100 MCG/ACT AERS respimat Inhale 1 puff into the lungs every 6 (six) hours as needed for wheezing or shortness of breath.  1 Inhaler  0  . Linaclotide (LINZESS) 290 MCG CAPS capsule Take 1 capsule (290 mcg total) by mouth daily.  12 capsule  0  . metoprolol (LOPRESSOR) 50 MG tablet Take 1  tablet (50 mg total) by mouth 2 (two) times daily.  180 tablet  3  . pantoprazole (PROTONIX) 40 MG tablet Take 40 mg by mouth daily.       . pravastatin (PRAVACHOL) 40 MG tablet Take 40 mg by mouth every evening.       . Probiotic Product (PROBIOTIC DAILY) CAPS Take 1 capsule by mouth daily.      . rifaximin (XIFAXAN) 550 MG TABS tablet Take 1 tablet (550 mg total) by mouth 2 (two) times daily.  20 tablet  0  . sodium chloride (OCEAN) 0.65 % nasal spray Place 1 spray into the nose as needed for congestion.      . valsartan (DIOVAN) 160 MG tablet Take 160 mg by mouth 2 (two) times daily.       . vitamin E (VITAMIN E) 400 UNIT capsule Take 400 Units by mouth daily.      . VOLTAREN 1 % GEL Apply 2 g topically 2 (two) times daily as needed (pain).        No current facility-administered medications on file prior to visit.   Family History  Problem Relation Age of Onset  . Hypertension Mother   . Heart disease Mother   . Dementia Mother   . Arthritis Mother   . Diabetes Mother   . Prostate cancer Father   . Lung cancer Maternal Uncle   . Hypertension Father   . Hypertension Sister   . Hypertension Brother   . Heart disease Sister   . Rectal cancer Neg Hx   . Stomach cancer Neg Hx   . Colon cancer Cousin   . Colon polyps Mother   . Colon polyps Father   . Inflammatory bowel disease Sister    History   Social History  . Marital Status: Divorced    Spouse Name: N/A    Number of Children: 4  . Years of Education: N/A   Occupational History  . Teacher    Social History Main Topics  . Smoking status: Former Smoker -- 0.30 packs/day for 8 years    Types: Cigarettes    Quit date: 09/13/1983  . Smokeless tobacco: Never Used  . Alcohol Use: No  . Drug Use: No  . Sexual Activity: Not Currently   Other Topics Concern  . Not on file   Social History Narrative  . No narrative on file    Review of Systems  Constitutional: Negative for fever, chills, diaphoresis, activity  change, appetite change and fatigue.  HENT: Negative for ear pain, nosebleeds, congestion, facial swelling, rhinorrhea, neck pain, neck stiffness and ear discharge.   Eyes: Negative for pain, discharge, redness, itching and visual disturbance.  Respiratory: Negative for cough, choking, chest tightness, shortness of breath, wheezing and stridor.   Cardiovascular: Negative for chest pain, palpitations and leg swelling.  Gastrointestinal: Negative for abdominal distention.  Genitourinary: Negative for dysuria, urgency, frequency, hematuria, flank pain, decreased urine volume, difficulty urinating and dyspareunia.  Musculoskeletal: Negative for back pain, joint swelling, arthralgias and gait problem.  Neurological: Negative for dizziness, tremors, seizures, syncope, facial asymmetry, speech difficulty, weakness, light-headedness, numbness and headaches.  Hematological: Negative for adenopathy. Does not bruise/bleed easily.  Psychiatric/Behavioral: Negative for hallucinations, behavioral problems, confusion, dysphoric mood, decreased concentration and agitation.    Objective:   Filed Vitals:   08/20/13 1704  BP: 166/100  Pulse: 90  Temp: 98.9 F (37.2 C)  Resp: 16    Physical Exam  Constitutional: Appears well-developed and well-nourished. No distress.  HENT: Normocephalic. External right and left ear normal. Oropharynx is clear and moist.  Eyes: Conjunctivae and EOM are normal. PERRLA, no scleral icterus.  Neck: Normal ROM. Neck supple. No JVD. No tracheal deviation. No thyromegaly.  CVS: RRR, S1/S2 +, no murmurs, no gallops, no carotid bruit.  Pulmonary: Effort and breath sounds normal, no stridor, rhonchi, wheezes, rales.  Abdominal: Soft. BS +,  no distension, tenderness, rebound or guarding.  Musculoskeletal: Normal range of motion. No edema and no tenderness.  Lymphadenopathy: No lymphadenopathy noted, cervical, inguinal. Neuro: Alert. Normal reflexes, muscle tone coordination. No  cranial nerve deficit. Skin: Skin is warm and dry. No rash noted. Not diaphoretic. No erythema. No pallor.  Psychiatric: Normal mood and affect. Behavior, judgment, thought content normal.   Lab Results  Component Value Date   WBC 3.5* 08/18/2013   HGB 11.0* 08/18/2013   HCT 32.3* 08/18/2013   MCV 89.7 08/18/2013   PLT 157 08/18/2013   Lab Results  Component Value Date   CREATININE 0.83 08/18/2013   BUN 16 08/18/2013   NA 141 08/18/2013   K 3.7 08/18/2013   CL 109 08/18/2013   CO2 25 08/18/2013    Lab Results  Component Value Date   HGBA1C 6.2 06/10/2011   Lipid Panel     Component Value Date/Time   CHOL 146 01/02/2013 0810   TRIG 132.0 01/02/2013 0810   HDL 39.10 01/02/2013 0810   CHOLHDL 4 01/02/2013 0810   VLDL 26.4 01/02/2013 0810   LDLCALC 81 01/02/2013 0810       Assessment and plan:   Patient Active Problem List   Diagnosis Date Noted  . CAP (community acquired pneumonia) 08/19/2013    Priority: Medium - Stable, on   . Normocytic anemia 08/19/2013    Priority: Medium - No need to repeat CBC today but we will have patient come back in one month and we will check CBC at that time   . Acute kidney injury 08/18/2013    Priority: Medium - Check BMP today   . GERD (gastroesophageal reflux disease) 05/13/2011    Priority: Medium - Continue Protonix   . ASTHMA 11/26/2007    Priority: Medium - Continue Advair as prescribed  - Continue Combivent as needed for shortness of breath or wheezing   . DEPRESSION, ANXIETY 09/28/2007    Priority: Medium - Continue BuSpar and Alprazolam  . Preventive Health 03/29/2007    Priority:  Medium - Patient is on quite an extensive amount of medications which can contribute to not only kidney failure or indigestion but are just redundant. Her calcium level is within normal limits and she should limit the amount of calcium she takes because it may cause more vascular problems in future. Pt instructed to drink milk and jogurt 1-2 times a day  .  HYPERTENSION 03/29/2007    Priority: Medium - We have discussed target BP range - I have advised pt to check BP regularly and to call us back if the numbers are higher than 140/90 - discussed the importance of compliance with medical therapy and diet  - continue current BP meds, valsartan and metoprolol - Patient instructed to come back in one month for blood pressure recheck   . Dyslipidemia - Continue statin therapy  03/29/2007

## 2013-08-21 LAB — BASIC METABOLIC PANEL WITH GFR
BUN: 14 mg/dL (ref 6–23)
CO2: 28 meq/L (ref 19–32)
Calcium: 10.6 mg/dL — ABNORMAL HIGH (ref 8.4–10.5)
Chloride: 106 meq/L (ref 96–112)
Creat: 0.76 mg/dL (ref 0.50–1.10)
Glucose, Bld: 103 mg/dL — ABNORMAL HIGH (ref 70–99)
Potassium: 3.9 meq/L (ref 3.5–5.3)
Sodium: 138 meq/L (ref 135–145)

## 2013-08-21 NOTE — ED Provider Notes (Signed)
Medical screening examination/treatment/procedure(s) were performed by non-physician practitioner and as supervising physician I was immediately available for consultation/collaboration.  EKG Interpretation    Date/Time:  Saturday August 17 2013 22:59:54 EST Ventricular Rate:  86 PR Interval:  175 QRS Duration: 83 QT Interval:  394 QTC Calculation: 471 R Axis:   55 Text Interpretation:  Sinus rhythm Low voltage, precordial leads ED PHYSICIAN INTERPRETATION AVAILABLE IN [facility] Confirmed by TEST, RECORD ([id number]), editor CLAYTON  CCT  CETT, ROBIN (2) on 08/19/2013 9:39:48 AM            Olivia Mackie, MD 08/21/13 1354

## 2013-08-22 ENCOUNTER — Encounter: Payer: Self-pay | Admitting: *Deleted

## 2013-08-24 LAB — OTHER SOLSTAS TEST: Calprotectin: 63.7 mcg/g (ref <=162.9)

## 2013-08-27 ENCOUNTER — Ambulatory Visit (INDEPENDENT_AMBULATORY_CARE_PROVIDER_SITE_OTHER): Payer: Medicare Other | Admitting: Psychiatry

## 2013-08-27 DIAGNOSIS — F4323 Adjustment disorder with mixed anxiety and depressed mood: Secondary | ICD-10-CM | POA: Diagnosis not present

## 2013-08-27 DIAGNOSIS — Z630 Problems in relationship with spouse or partner: Secondary | ICD-10-CM | POA: Diagnosis not present

## 2013-08-28 DIAGNOSIS — G894 Chronic pain syndrome: Secondary | ICD-10-CM | POA: Insufficient documentation

## 2013-08-28 DIAGNOSIS — R32 Unspecified urinary incontinence: Secondary | ICD-10-CM | POA: Diagnosis not present

## 2013-08-28 DIAGNOSIS — M545 Low back pain, unspecified: Secondary | ICD-10-CM | POA: Diagnosis not present

## 2013-08-28 DIAGNOSIS — Z853 Personal history of malignant neoplasm of breast: Secondary | ICD-10-CM | POA: Diagnosis not present

## 2013-08-28 DIAGNOSIS — F329 Major depressive disorder, single episode, unspecified: Secondary | ICD-10-CM | POA: Diagnosis not present

## 2013-08-28 DIAGNOSIS — Z791 Long term (current) use of non-steroidal anti-inflammatories (NSAID): Secondary | ICD-10-CM | POA: Diagnosis not present

## 2013-08-28 DIAGNOSIS — IMO0002 Reserved for concepts with insufficient information to code with codable children: Secondary | ICD-10-CM | POA: Diagnosis not present

## 2013-08-28 DIAGNOSIS — Z91041 Radiographic dye allergy status: Secondary | ICD-10-CM | POA: Diagnosis not present

## 2013-08-28 DIAGNOSIS — M461 Sacroiliitis, not elsewhere classified: Secondary | ICD-10-CM | POA: Diagnosis not present

## 2013-08-28 DIAGNOSIS — I10 Essential (primary) hypertension: Secondary | ICD-10-CM | POA: Diagnosis not present

## 2013-08-28 DIAGNOSIS — Z7982 Long term (current) use of aspirin: Secondary | ICD-10-CM | POA: Diagnosis not present

## 2013-08-28 DIAGNOSIS — Z885 Allergy status to narcotic agent status: Secondary | ICD-10-CM | POA: Diagnosis not present

## 2013-08-28 DIAGNOSIS — I890 Lymphedema, not elsewhere classified: Secondary | ICD-10-CM | POA: Diagnosis not present

## 2013-08-28 DIAGNOSIS — Z7983 Long term (current) use of bisphosphonates: Secondary | ICD-10-CM | POA: Diagnosis not present

## 2013-08-28 DIAGNOSIS — F411 Generalized anxiety disorder: Secondary | ICD-10-CM | POA: Diagnosis not present

## 2013-08-28 DIAGNOSIS — I252 Old myocardial infarction: Secondary | ICD-10-CM | POA: Diagnosis not present

## 2013-08-28 DIAGNOSIS — Z880 Allergy status to penicillin: Secondary | ICD-10-CM | POA: Diagnosis not present

## 2013-08-28 DIAGNOSIS — I2510 Atherosclerotic heart disease of native coronary artery without angina pectoris: Secondary | ICD-10-CM | POA: Diagnosis not present

## 2013-08-28 DIAGNOSIS — Z888 Allergy status to other drugs, medicaments and biological substances status: Secondary | ICD-10-CM | POA: Diagnosis not present

## 2013-08-28 DIAGNOSIS — Z883 Allergy status to other anti-infective agents status: Secondary | ICD-10-CM | POA: Diagnosis not present

## 2013-08-28 DIAGNOSIS — Z884 Allergy status to anesthetic agent status: Secondary | ICD-10-CM | POA: Diagnosis not present

## 2013-08-28 DIAGNOSIS — Z882 Allergy status to sulfonamides status: Secondary | ICD-10-CM | POA: Diagnosis not present

## 2013-08-28 DIAGNOSIS — Z79899 Other long term (current) drug therapy: Secondary | ICD-10-CM | POA: Diagnosis not present

## 2013-08-30 ENCOUNTER — Ambulatory Visit (INDEPENDENT_AMBULATORY_CARE_PROVIDER_SITE_OTHER): Payer: Medicare Other | Admitting: Gastroenterology

## 2013-08-30 ENCOUNTER — Encounter: Payer: Self-pay | Admitting: Gastroenterology

## 2013-08-30 VITALS — BP 138/90 | HR 80 | Ht 63.0 in | Wt 179.5 lb

## 2013-08-30 DIAGNOSIS — K589 Irritable bowel syndrome without diarrhea: Secondary | ICD-10-CM

## 2013-08-30 DIAGNOSIS — K5900 Constipation, unspecified: Secondary | ICD-10-CM

## 2013-08-30 NOTE — Patient Instructions (Addendum)
We have given you samples of the following medication to take: VSL # 3 probiotic powder, you can sprinkle in apple sauce or yogurt once daily. (PLEASE KEEP IN REFRIGERATOR)  New prescription for Linzess 290 mcg was sent to your pharmacy.

## 2013-08-30 NOTE — Progress Notes (Signed)
This is a 62 year old Caucasian female with constipation predominant IBS.  She was supposed to take Xifaxan for bacterial overgrowth syndrome as a clinical trial, but did not do so because of drug expanse.  She is on Linzess 290 mcg a day and has had improvement in her constipation gas and bloating.  She also follows a FOD-MAP diet for IBS.  Patient is up-to-date on endoscopy and colonoscopy.  Current Medications, Allergies, Past Medical History, Past Surgical History, Family History and Social History were reviewed in [employer] record.  ROS: All systems were reviewed and are negative unless otherwise stated in the HPI.          Physical Exam: Healthy-appearing patient in no distress.  Low pressure 138/90, pulse 80 and regular and weight 179 the BMI of 31.81.  Abdominal exam shows no distention, organomegaly, masses or tenderness.  Bowel sounds are entirely normal.  Mental status is normal    Assessment and Plan: Constipation predominant IBS doing better as mentioned above.  We will continue these medications and dietary restrictions.  I've also given her a two-week trial of VSL#3 probiotic therapy.  She will be seen again in the future on a when necessary basis as needed.  She is up-to-date on her endoscopy and colonoscopy.

## 2013-09-09 DIAGNOSIS — M545 Low back pain, unspecified: Secondary | ICD-10-CM | POA: Diagnosis not present

## 2013-09-09 DIAGNOSIS — G894 Chronic pain syndrome: Secondary | ICD-10-CM | POA: Diagnosis not present

## 2013-09-09 DIAGNOSIS — M5137 Other intervertebral disc degeneration, lumbosacral region: Secondary | ICD-10-CM | POA: Diagnosis not present

## 2013-09-09 DIAGNOSIS — M533 Sacrococcygeal disorders, not elsewhere classified: Secondary | ICD-10-CM | POA: Diagnosis not present

## 2013-09-17 ENCOUNTER — Ambulatory Visit (INDEPENDENT_AMBULATORY_CARE_PROVIDER_SITE_OTHER): Payer: Medicare Other | Admitting: Psychiatry

## 2013-09-17 DIAGNOSIS — Z630 Problems in relationship with spouse or partner: Secondary | ICD-10-CM | POA: Diagnosis not present

## 2013-09-17 DIAGNOSIS — F4323 Adjustment disorder with mixed anxiety and depressed mood: Secondary | ICD-10-CM | POA: Diagnosis not present

## 2013-09-20 DIAGNOSIS — M545 Low back pain, unspecified: Secondary | ICD-10-CM | POA: Diagnosis not present

## 2013-09-20 DIAGNOSIS — Z859 Personal history of malignant neoplasm, unspecified: Secondary | ICD-10-CM | POA: Diagnosis not present

## 2013-09-20 DIAGNOSIS — Z883 Allergy status to other anti-infective agents status: Secondary | ICD-10-CM | POA: Diagnosis not present

## 2013-09-20 DIAGNOSIS — Z90710 Acquired absence of both cervix and uterus: Secondary | ICD-10-CM | POA: Diagnosis not present

## 2013-09-20 DIAGNOSIS — Z8701 Personal history of pneumonia (recurrent): Secondary | ICD-10-CM | POA: Diagnosis not present

## 2013-09-20 DIAGNOSIS — Z9010 Acquired absence of unspecified breast and nipple: Secondary | ICD-10-CM | POA: Diagnosis not present

## 2013-09-20 DIAGNOSIS — IMO0001 Reserved for inherently not codable concepts without codable children: Secondary | ICD-10-CM | POA: Diagnosis not present

## 2013-09-20 DIAGNOSIS — Z888 Allergy status to other drugs, medicaments and biological substances status: Secondary | ICD-10-CM | POA: Diagnosis not present

## 2013-09-20 DIAGNOSIS — M533 Sacrococcygeal disorders, not elsewhere classified: Secondary | ICD-10-CM | POA: Diagnosis not present

## 2013-09-20 DIAGNOSIS — Z79899 Other long term (current) drug therapy: Secondary | ICD-10-CM | POA: Diagnosis not present

## 2013-09-20 DIAGNOSIS — Z7982 Long term (current) use of aspirin: Secondary | ICD-10-CM | POA: Diagnosis not present

## 2013-09-20 DIAGNOSIS — Z87891 Personal history of nicotine dependence: Secondary | ICD-10-CM | POA: Diagnosis not present

## 2013-09-23 ENCOUNTER — Encounter: Payer: Self-pay | Admitting: Internal Medicine

## 2013-09-23 ENCOUNTER — Ambulatory Visit: Payer: Medicare Other | Attending: Internal Medicine | Admitting: Internal Medicine

## 2013-09-23 VITALS — BP 179/93 | HR 75 | Temp 98.5°F | Resp 16 | Ht 63.0 in | Wt 175.0 lb

## 2013-09-23 DIAGNOSIS — I10 Essential (primary) hypertension: Secondary | ICD-10-CM | POA: Insufficient documentation

## 2013-09-23 DIAGNOSIS — K219 Gastro-esophageal reflux disease without esophagitis: Secondary | ICD-10-CM | POA: Diagnosis not present

## 2013-09-23 DIAGNOSIS — F411 Generalized anxiety disorder: Secondary | ICD-10-CM | POA: Diagnosis not present

## 2013-09-23 MED ORDER — CLONIDINE HCL 0.1 MG PO TABS
0.1000 mg | ORAL_TABLET | Freq: Once | ORAL | Status: AC
  Administered 2013-09-23: 0.1 mg via ORAL

## 2013-09-23 MED ORDER — MUPIROCIN 2 % EX OINT: 1.0000 "application " | TOPICAL_OINTMENT | Freq: Two times a day (BID) | CUTANEOUS | Status: DC

## 2013-09-23 NOTE — Patient Instructions (Signed)

## 2013-09-23 NOTE — Progress Notes (Signed)
Pt is here following up on her HTN and chronic lumbar back pain. Pt has a itchy red dry spot under her left breast.

## 2013-09-25 ENCOUNTER — Ambulatory Visit: Payer: Medicare Other | Attending: Cardiology | Admitting: Cardiology

## 2013-09-25 ENCOUNTER — Encounter: Payer: Self-pay | Admitting: Cardiology

## 2013-09-25 VITALS — BP 168/99 | HR 71 | Temp 98.8°F | Resp 14 | Ht 63.0 in | Wt 177.6 lb

## 2013-09-25 DIAGNOSIS — I10 Essential (primary) hypertension: Secondary | ICD-10-CM

## 2013-09-25 DIAGNOSIS — I2510 Atherosclerotic heart disease of native coronary artery without angina pectoris: Secondary | ICD-10-CM | POA: Diagnosis not present

## 2013-09-25 DIAGNOSIS — R079 Chest pain, unspecified: Secondary | ICD-10-CM | POA: Diagnosis not present

## 2013-09-25 MED ORDER — VALSARTAN 320 MG PO TABS: 320.0000 mg | ORAL_TABLET | Freq: Every day | ORAL | Status: DC

## 2013-09-25 MED ORDER — VALSARTAN 320 MG PO TABS: ORAL_TABLET | ORAL | Status: DC

## 2013-09-25 NOTE — Progress Notes (Signed)
HPI [name] returns today for further evaluation of her history of coronary artery disease, myocardial infarction, stent placement, and recent problems with her blood pressure being elevated.  Was seen in this clinic 2 days ago. Her Lopressor was increased to twice a day. She's having no cardiac chest pain. She does on occasion has been sharp mid sternal chest pain with no radiation. Last night she had some soreness in her left posterior thorax. She is recovering from community acquired pneumonia. She denies any hemoptysis, cough, fever.  She did take her blood pressure medicine this morning. She is taking her ARB twice a day.  Past Medical History  Diagnosis Date  . Myocardial infarct 2005  . CAD (coronary artery disease)   . Hypertension   . Long term (current) use of anticoagulants   . Edema   . Hematoma   . Sinusitis acute   . Adenocarcinoma of breast     right  . Acute upper respiratory infections of unspecified site   . Depression   . Anal fissure   . GERD (gastroesophageal reflux disease)   . Dog bite(E906.0)   . Diverticulosis of colon (without mention of hemorrhage)   . Spinal stenosis, lumbar region, without neurogenic claudication   . Anxiety   . Panic attacks   . Asthma   . Irritable bowel syndrome   . Hiatal hernia   . Jaundice     Hx of Jaundice at age 51 from "dirty restuarant". Unsure of Hepatitis type    Current Outpatient Prescriptions  Medication Sig Dispense Refill  . acetaminophen (TYLENOL) 500 MG tablet Take 500 mg by mouth every 8 (eight) hours as needed (pain).       Marland Kitchen ALPRAZolam (XANAX) 1 MG tablet Take 0.5-1 tablets (0.5-1 mg total) by mouth 2 (two) times daily as needed for anxiety or sleep.  60 tablet  5  . ascorbic acid (VITAMIN C) 1000 MG tablet Take 1,000 mg by mouth daily.      Marland Kitchen aspirin 81 MG tablet Take 81 mg by mouth daily.        . Calcium Carbonate-Vitamin D (CALCIUM-CARB 600 + D) 600-125 MG-UNIT TABS Take 1 capsule by mouth daily.        . Camphor-Menthol-Methyl Sal (SALONPAS) 1.2-5.7-6.3 % PTCH Apply 3 patches topically daily. Removes patches at night      . cycloSPORINE (RESTASIS) 0.05 % ophthalmic emulsion Place 1 drop into both eyes 2 (two) times daily.        . Fluticasone-Salmeterol (ADVAIR) 250-50 MCG/DOSE AEPB Inhale 1 puff into the lungs 2 (two) times daily.  60 each  0  . gabapentin (NEURONTIN) 300 MG capsule Take 1 capsule (300 mg total) by mouth at bedtime. Take 1 cap at bedtime then 2 caps times 7 days then 3 caps qhs  30 capsule  0  . Ipratropium-Albuterol (COMBIVENT) 20-100 MCG/ACT AERS respimat Inhale 1 puff into the lungs every 6 (six) hours as needed for wheezing or shortness of breath.  1 Inhaler  0  . Linaclotide (LINZESS) 290 MCG CAPS capsule Take 1 capsule (290 mcg total) by mouth daily.  12 capsule  0  . metoprolol (LOPRESSOR) 50 MG tablet Take 50 mg by mouth daily.      . mupirocin ointment (BACTROBAN) 2 % Place 1 application into the nose 2 (two) times daily.  22 g  0  . pantoprazole (PROTONIX) 40 MG tablet Take 40 mg by mouth daily.       . pravastatin (  PRAVACHOL) 40 MG tablet Take 40 mg by mouth every evening.       . Probiotic Product (PROBIOTIC DAILY) CAPS Take 1 capsule by mouth daily.      . rifaximin (XIFAXAN) 550 MG TABS tablet Take 1 tablet (550 mg total) by mouth 2 (two) times daily.  20 tablet  0  . sodium chloride (OCEAN) 0.65 % nasal spray Place 1 spray into the nose as needed for congestion.      . valsartan (DIOVAN) 320 MG tablet Take in am daily  60 tablet  1  . vitamin E (VITAMIN E) 400 UNIT capsule Take 400 Units by mouth daily.      . VOLTAREN 1 % GEL Apply 2 g topically 2 (two) times daily as needed (pain).        No current facility-administered medications for this visit.    Allergies  Allergen Reactions  . Amoxicillin Swelling    Throat Swells  . Azithromycin Swelling    Throat Swelling  . Bromfed Swelling    Throat Swelling  . Cephalexin Swelling    Throat Swelling  .  Chlordiazepoxide-Clidinium Swelling    Throat Swelling  . Clotrimazole Other (See Comments) and Hypertension    Patient told me that she couldn't swallow due to the medication  . Dicyclomine Hcl Hives  . Hydralazine Hcl     Rash and itching  . Ibuprofen     N/V  . Iohexol      Code: HIVES, Desc: throat swelling no hives 20 yrs ago;needs pre-medication  09/19/07 sg, Onset Date: [id number]   . Latex Swelling    Blisters on Skin  . Lidocaine Hives  . Paroxetine Swelling    Throat Swelling  . Penicillins Hives  . Propoxyphene N-Acetaminophen Swelling    REACTION: swelling in the throat  . Sertraline Hcl Swelling    Throat Swelling  . Sulfadiazine Swelling    Throat Swelling  . Verapamil Swelling    Throat Swelling    Family History  Problem Relation Age of Onset  . Hypertension Mother   . Heart disease Mother   . Dementia Mother   . Arthritis Mother   . Diabetes Mother   . Prostate cancer Father   . Lung cancer Maternal Uncle   . Hypertension Father   . Hypertension Sister   . Hypertension Brother   . Heart disease Sister   . Rectal cancer Neg Hx   . Stomach cancer Neg Hx   . Colon cancer Cousin   . Colon polyps Mother   . Colon polyps Father   . Inflammatory bowel disease Sister     History   Social History  . Marital Status: Divorced    Spouse Name: N/A    Number of Children: 24  . Years of Education: N/A   Occupational History  . Teacher    Social History Main Topics  . Smoking status: Former Smoker -- 0.30 packs/day for 8 years    Types: Cigarettes    Quit date: 09/13/1983  . Smokeless tobacco: Never Used  . Alcohol Use: No  . Drug Use: No  . Sexual Activity: Not Currently   Other Topics Concern  . Not on file   Social History Narrative  . No narrative on file    ROS ALL NEGATIVE EXCEPT THOSE NOTED IN HPI  PE  General Appearance: well developed, well nourished in no acute distress, obese HEENT: symmetrical face, PERRLA, good dentition   Neck: no JVD, thyromegaly, or  adenopathy, trachea midline Chest: symmetric without deformity Cardiac: PMI non-displaced, RRR, normal S1, S2, no gallop or murmur Lung: clear to ausculation and percussion, no rub Vascular: all pulses full without bruits  Abdominal: nondistended, nontender, good bowel sounds, no HSM, no bruits Extremities: no cyanosis, clubbing or edema, no sign of DVT, no varicosities  Skin: normal color, no rashes Neuro: alert and oriented x 3, non-focal Pysch: normal affect  EKG  not repeated BMET    Component Value Date/Time   NA 138 08/20/2013 1709   NA 144 07/02/2013 0932   K 3.9 08/20/2013 1709   K 3.4* 07/02/2013 0932   CL 106 08/20/2013 1709   CL 104 12/07/2012 1341   CO2 28 08/20/2013 1709   CO2 26 07/02/2013 0932   GLUCOSE 103* 08/20/2013 1709   GLUCOSE 141* 07/02/2013 0932   GLUCOSE 110* 12/07/2012 1341   BUN 14 08/20/2013 1709   BUN 10.9 07/02/2013 0932   CREATININE 0.76 08/20/2013 1709   CREATININE 0.83 08/18/2013 0526   CREATININE 0.9 07/02/2013 0932   CALCIUM 10.6* 08/20/2013 1709   CALCIUM 9.9 07/02/2013 0932   GFRNONAA 75* 08/18/2013 0526   GFRAA 87* 08/18/2013 0526    Lipid Panel     Component Value Date/Time   CHOL 146 01/02/2013 0810   TRIG 132.0 01/02/2013 0810   HDL 39.10 01/02/2013 0810   CHOLHDL 4 01/02/2013 0810   VLDL 26.4 01/02/2013 0810   LDLCALC 81 01/02/2013 0810    CBC    Component Value Date/Time   WBC 3.5* 08/18/2013 0526   WBC 3.2* 07/02/2013 0932   RBC 3.60* 08/18/2013 0526   RBC 4.31 07/02/2013 0932   HGB 11.0* 08/18/2013 0526   HGB 13.0 07/02/2013 0932   HCT 32.3* 08/18/2013 0526   HCT 38.5 07/02/2013 0932   PLT 157 08/18/2013 0526   PLT 178 07/02/2013 0932   MCV 89.7 08/18/2013 0526   MCV 89.4 07/02/2013 0932   MCH 30.6 08/18/2013 0526   MCH 30.3 07/02/2013 0932   MCHC 34.1 08/18/2013 0526   MCHC 33.9 07/02/2013 0932   RDW 13.2 08/18/2013 0526   RDW 13.5 07/02/2013 0932   LYMPHSABS 1.1 07/02/2013 0932   LYMPHSABS 1.7  03/29/2013 1127   MONOABS 0.2 07/02/2013 0932   MONOABS 0.4 03/29/2013 1127   EOSABS 0.1 07/02/2013 0932   EOSABS 0.1 03/29/2013 1127   BASOSABS 0.0 07/02/2013 0932   BASOSABS 0.0 03/29/2013 1127

## 2013-09-25 NOTE — Assessment & Plan Note (Signed)
Stable. Continue secondary preventative therapy with better control blood pressure. She'll need repeat lipids in April. I will see her at that time.

## 2013-09-25 NOTE — Progress Notes (Signed)
Pt is here for a f/u form last visit. Complains of hypertension and chest pain. No pain today. Pain in the middle Rt side of her pain x1 night.

## 2013-09-25 NOTE — Assessment & Plan Note (Signed)
I've asked her to increase her ARB to 320 mg each morning instead of 160 mg twice a day. She returned to the clinic in a week for a blood pressure check. Goal blood pressure less than 140/90. She'll continue with twice a day Lopressor.

## 2013-09-25 NOTE — Progress Notes (Signed)
Patient ID: [name], female   DOB: February 26, 1951, 63 y.o.   MRN: [id number] Patient Demographics  [name], is a 63 y.o. female  IOX:[id number]  QAS:[id number]  DOB - Apr 04, 1951  Chief Complaint  Patient presents with  . Follow-up        Subjective:   [name] is a 63 y.o. female here today for a follow up visit. Patient is here for followup of her blood pressure, she is taking metoprolol 50 mg tablet by mouth daily and valsartan 160 mg tablet by mouth twice a day (this should have been a once daily medication). She has no major complaints today. She did refills of her Xanax for anxiety, she will see cardiologist in 2 days. She's not due for blood tests today. She also complained of a rash under her breasts that is very itchy and excoriated. It has happened before and she used over-the-counter cream and that was resolved. No history of trauma. She is a breast cancer survivor status post treatment Patient has No headache, No chest pain, No abdominal pain - No Nausea, No new weakness tingling or numbness, No Cough - SOB.  ALLERGIES: Allergies  Allergen Reactions  . Amoxicillin Swelling    Throat Swells  . Azithromycin Swelling    Throat Swelling  . Bromfed Swelling    Throat Swelling  . Cephalexin Swelling    Throat Swelling  . Chlordiazepoxide-Clidinium Swelling    Throat Swelling  . Clotrimazole Other (See Comments) and Hypertension    Patient told me that she couldn't swallow due to the medication  . Dicyclomine Hcl Hives  . Hydralazine Hcl     Rash and itching  . Ibuprofen     N/V  . Iohexol      Code: HIVES, Desc: throat swelling no hives 20 yrs ago;needs pre-medication  09/19/07 sg, Onset Date: [id number]   . Latex Swelling    Blisters on Skin  . Lidocaine Hives  . Paroxetine Swelling    Throat Swelling  . Penicillins Hives  . Propoxyphene N-Acetaminophen Swelling    REACTION: swelling in the throat  . Sertraline Hcl Swelling    Throat Swelling  .  Sulfadiazine Swelling    Throat Swelling  . Verapamil Swelling    Throat Swelling    PAST MEDICAL HISTORY: Past Medical History  Diagnosis Date  . Myocardial infarct 2005  . CAD (coronary artery disease)   . Hypertension   . Long term (current) use of anticoagulants   . Edema   . Hematoma   . Sinusitis acute   . Adenocarcinoma of breast     right  . Acute upper respiratory infections of unspecified site   . Depression   . Anal fissure   . GERD (gastroesophageal reflux disease)   . Dog bite(E906.0)   . Diverticulosis of colon (without mention of hemorrhage)   . Spinal stenosis, lumbar region, without neurogenic claudication   . Anxiety   . Panic attacks   . Asthma   . Irritable bowel syndrome   . Hiatal hernia   . Jaundice     Hx of Jaundice at age 42 from "dirty restuarant". Unsure of Hepatitis type    MEDICATIONS AT HOME: Prior to Admission medications   Medication Sig Start Date End Date Taking? Authorizing Provider  acetaminophen (TYLENOL) 500 MG tablet Take 500 mg by mouth every 8 (eight) hours as needed (pain).    Yes Historical Provider, MD  ALPRAZolam Duanne Moron) 1 MG tablet Take 0.5-1 tablets (0.5-1  mg total) by mouth 2 (two) times daily as needed for anxiety or sleep. 08/20/13  Yes Robbie Lis, MD  ascorbic acid (VITAMIN C) 1000 MG tablet Take 1,000 mg by mouth daily.   Yes Historical Provider, MD  aspirin 81 MG tablet Take 81 mg by mouth daily.     Yes Historical Provider, MD  Calcium Carbonate-Vitamin D (CALCIUM-CARB 600 + D) 600-125 MG-UNIT TABS Take 1 capsule by mouth daily.    Yes Historical Provider, MD  cycloSPORINE (RESTASIS) 0.05 % ophthalmic emulsion Place 1 drop into both eyes 2 (two) times daily.     Yes Historical Provider, MD  Fluticasone-Salmeterol (ADVAIR) 250-50 MCG/DOSE AEPB Inhale 1 puff into the lungs 2 (two) times daily. 08/19/13  Yes Janece Canterbury, MD  gabapentin (NEURONTIN) 300 MG capsule Take 1 capsule (300 mg total) by mouth at bedtime. Take  1 cap at bedtime then 2 caps times 7 days then 3 caps qhs 08/19/13  Yes Janece Canterbury, MD  sodium chloride (OCEAN) 0.65 % nasal spray Place 1 spray into the nose as needed for congestion.   Yes Historical Provider, MD  vitamin E (VITAMIN E) 400 UNIT capsule Take 400 Units by mouth daily.   Yes Historical Provider, MD  VOLTAREN 1 % GEL Apply 2 g topically 2 (two) times daily as needed (pain).  06/30/13  Yes Historical Provider, MD  Camphor-Menthol-Methyl Sal (SALONPAS) 1.2-5.7-6.3 % PTCH Apply 3 patches topically daily. Removes patches at night    Historical Provider, MD  Ipratropium-Albuterol (COMBIVENT) 20-100 MCG/ACT AERS respimat Inhale 1 puff into the lungs every 6 (six) hours as needed for wheezing or shortness of breath. 08/19/13   Janece Canterbury, MD  Linaclotide Rolan Lipa) 290 MCG CAPS capsule Take 1 capsule (290 mcg total) by mouth daily. 08/16/13   Sable Feil, MD  metoprolol (LOPRESSOR) 50 MG tablet Take 50 mg by mouth daily. 11/20/12   Renella Cunas, MD  mupirocin ointment (BACTROBAN) 2 % Place 1 application into the nose 2 (two) times daily. 09/23/13   Angelica Chessman, MD  pantoprazole (PROTONIX) 40 MG tablet Take 40 mg by mouth daily.  07/24/13   Historical Provider, MD  pravastatin (PRAVACHOL) 40 MG tablet Take 40 mg by mouth every evening.  06/11/13   Historical Provider, MD  Probiotic Product (PROBIOTIC DAILY) CAPS Take 1 capsule by mouth daily.    Historical Provider, MD  rifaximin (XIFAXAN) 550 MG TABS tablet Take 1 tablet (550 mg total) by mouth 2 (two) times daily. 08/16/13   Sable Feil, MD  valsartan (DIOVAN) 320 MG tablet Take in am daily 09/25/13   Renella Cunas, MD     Objective:   Filed Vitals:   09/23/13 1603  BP: 179/93  Pulse: 75  Temp: 98.5 F (36.9 C)  TempSrc: Oral  Resp: 16  Height: 5\' 3"  (1.6 m)  Weight: 175 lb (79.379 kg)  SpO2: 98%    Exam General appearance : Awake, alert, not in any distress. Speech Clear. Not toxic looking HEENT:  Atraumatic and Normocephalic, pupils equally reactive to light and accomodation Neck: supple, no JVD. No cervical lymphadenopathy.  Chest:Good air entry bilaterally, no added sounds  CVS: S1 S2 regular, no murmurs.  Abdomen: Bowel sounds present, Non tender and not distended with no gaurding, rigidity or rebound. Extremities: B/L Lower Ext shows no edema, both legs are warm to touch Neurology: Awake alert, and oriented X 3, CN II-XII intact, Non focal Skin:No Rash Wounds:N/A   Data Review  CBC No results found for this basename: WBC, HGB, HCT, PLT, MCV, MCH, MCHC, RDW, NEUTRABS, LYMPHSABS, MONOABS, EOSABS, BASOSABS, BANDABS, BANDSABD,  in the last 168 hours  Chemistries   No results found for this basename: NA, K, CL, CO2, GLUCOSE, BUN, CREATININE, GFRCGP, CALCIUM, MG, AST, ALT, ALKPHOS, BILITOT,  in the last 168 hours ------------------------------------------------------------------------------------------------------------------ No results found for this basename: HGBA1C,  in the last 72 hours ------------------------------------------------------------------------------------------------------------------ No results found for this basename: CHOL, HDL, LDLCALC, TRIG, CHOLHDL, LDLDIRECT,  in the last 72 hours ------------------------------------------------------------------------------------------------------------------ No results found for this basename: TSH, T4TOTAL, FREET3, T3FREE, THYROIDAB,  in the last 72 hours ------------------------------------------------------------------------------------------------------------------ No results found for this basename: VITAMINB12, FOLATE, FERRITIN, TIBC, IRON, RETICCTPCT,  in the last 72 hours  Coagulation profile  No results found for this basename: INR, PROTIME,  in the last 168 hours    Assessment & Plan   1. HTN (hypertension): Uncontrolled  - cloNIDine (CATAPRES) tablet 0.1 mg; Take 1 tablet (0.1 mg total) by mouth  once  Increase metoprolol to 50 mg tablet by mouth twice a day Continue valsartan the current dose To see the cardiologist in 2 days  2. GERD (gastroesophageal reflux disease) Continue Protonix 40 mg tablet by mouth daily  3. Anxiety state, unspecified Continue Xanax 1 mg tablet by mouth each bedtime  4. Abrasions from friction under the left breast Mupirocin ointment for local application Patient was educated on wound care Return to clinic in a week if symptoms persist  Patient was extensively counseled about nutrition and exercise  Follow up in 3 months or when necessary for followup for blood draw   The patient was given clear instructions to go to ER or return to medical center if symptoms don't improve, worsen or new problems develop. The patient verbalized understanding. The patient was told to call to get lab results if they haven't heard anything in the next week.    Angelica Chessman, MD, [place], [place], [place] and [facility] [place], [place]   09/23/2013

## 2013-10-01 DIAGNOSIS — M545 Low back pain, unspecified: Secondary | ICD-10-CM | POA: Diagnosis not present

## 2013-10-01 DIAGNOSIS — IMO0002 Reserved for concepts with insufficient information to code with codable children: Secondary | ICD-10-CM | POA: Diagnosis not present

## 2013-10-01 DIAGNOSIS — M5137 Other intervertebral disc degeneration, lumbosacral region: Secondary | ICD-10-CM | POA: Diagnosis not present

## 2013-10-01 DIAGNOSIS — M533 Sacrococcygeal disorders, not elsewhere classified: Secondary | ICD-10-CM | POA: Diagnosis not present

## 2013-10-02 ENCOUNTER — Ambulatory Visit: Payer: Medicare Other | Attending: Internal Medicine

## 2013-10-02 ENCOUNTER — Ambulatory Visit: Payer: Self-pay | Admitting: Cardiology

## 2013-10-03 NOTE — Patient Instructions (Signed)
Pt instructed to continue taking prescribed medication and return at next scheduled appt

## 2013-10-03 NOTE — Progress Notes (Unsigned)
   Subjective:    Patient ID: [name], female    DOB: 1951-09-03, 63 y.o.   MRN: [id number]  HPI    Review of Systems     Objective:   Physical Exam        Assessment & Plan:  Pt here for Bp recheck post high blood pressures Pt is taking medication prescribed daily BP 158/94

## 2013-10-07 DIAGNOSIS — Z961 Presence of intraocular lens: Secondary | ICD-10-CM | POA: Diagnosis not present

## 2013-10-07 DIAGNOSIS — H04129 Dry eye syndrome of unspecified lacrimal gland: Secondary | ICD-10-CM | POA: Diagnosis not present

## 2013-10-15 ENCOUNTER — Ambulatory Visit: Payer: Federal, State, Local not specified - Other | Admitting: Psychiatry

## 2013-11-15 ENCOUNTER — Other Ambulatory Visit: Payer: Self-pay

## 2013-11-15 MED ORDER — METOPROLOL TARTRATE 50 MG PO TABS: 50.0000 mg | ORAL_TABLET | Freq: Every day | ORAL | Status: DC

## 2013-11-18 ENCOUNTER — Telehealth: Payer: Self-pay | Admitting: Internal Medicine

## 2013-11-18 NOTE — Telephone Encounter (Signed)
Pt. called  to schedule an appointment to see her doctor ( Dr. Doreene Burke), the earliest appt. available is for  3/30. Pt. would like for a nurse or doctor to call and advice as to what patient can do. Pt.'s symptoms are fever, sore throat, ear ache, and body aches. Pt. was informed that she could go to  [facility], but prefers to get advice from provider first.

## 2013-11-19 ENCOUNTER — Other Ambulatory Visit: Payer: Self-pay | Admitting: Emergency Medicine

## 2013-11-19 MED ORDER — DOXYCYCLINE HYCLATE 100 MG PO TABS
100.0000 mg | ORAL_TABLET | Freq: Two times a day (BID) | ORAL | Status: DC
Start: 2013-11-19 — End: 2013-11-26

## 2013-11-19 MED ORDER — FLUTICASONE-SALMETEROL 250-50 MCG/DOSE IN AEPB: 1.0000 | INHALATION_SPRAY | Freq: Two times a day (BID) | RESPIRATORY_TRACT | Status: DC

## 2013-11-26 ENCOUNTER — Encounter: Payer: Self-pay | Admitting: Internal Medicine

## 2013-11-26 ENCOUNTER — Ambulatory Visit: Payer: Medicare Other | Attending: Internal Medicine | Admitting: Internal Medicine

## 2013-11-26 VITALS — BP 167/100 | HR 85 | Temp 98.0°F | Wt 181.4 lb

## 2013-11-26 DIAGNOSIS — R5381 Other malaise: Secondary | ICD-10-CM

## 2013-11-26 DIAGNOSIS — I252 Old myocardial infarction: Secondary | ICD-10-CM | POA: Insufficient documentation

## 2013-11-26 DIAGNOSIS — K219 Gastro-esophageal reflux disease without esophagitis: Secondary | ICD-10-CM | POA: Diagnosis not present

## 2013-11-26 DIAGNOSIS — Z79899 Other long term (current) drug therapy: Secondary | ICD-10-CM | POA: Insufficient documentation

## 2013-11-26 DIAGNOSIS — E785 Hyperlipidemia, unspecified: Secondary | ICD-10-CM | POA: Diagnosis not present

## 2013-11-26 DIAGNOSIS — F329 Major depressive disorder, single episode, unspecified: Secondary | ICD-10-CM | POA: Insufficient documentation

## 2013-11-26 DIAGNOSIS — F3289 Other specified depressive episodes: Secondary | ICD-10-CM | POA: Diagnosis not present

## 2013-11-26 DIAGNOSIS — C50919 Malignant neoplasm of unspecified site of unspecified female breast: Secondary | ICD-10-CM | POA: Insufficient documentation

## 2013-11-26 DIAGNOSIS — Z8249 Family history of ischemic heart disease and other diseases of the circulatory system: Secondary | ICD-10-CM | POA: Insufficient documentation

## 2013-11-26 DIAGNOSIS — R7309 Other abnormal glucose: Secondary | ICD-10-CM

## 2013-11-26 DIAGNOSIS — Z87891 Personal history of nicotine dependence: Secondary | ICD-10-CM | POA: Insufficient documentation

## 2013-11-26 DIAGNOSIS — J45909 Unspecified asthma, uncomplicated: Secondary | ICD-10-CM | POA: Diagnosis not present

## 2013-11-26 DIAGNOSIS — R5383 Other fatigue: Secondary | ICD-10-CM | POA: Diagnosis not present

## 2013-11-26 DIAGNOSIS — M48061 Spinal stenosis, lumbar region without neurogenic claudication: Secondary | ICD-10-CM | POA: Diagnosis not present

## 2013-11-26 DIAGNOSIS — I10 Essential (primary) hypertension: Secondary | ICD-10-CM | POA: Diagnosis not present

## 2013-11-26 DIAGNOSIS — F411 Generalized anxiety disorder: Secondary | ICD-10-CM | POA: Insufficient documentation

## 2013-11-26 DIAGNOSIS — R739 Hyperglycemia, unspecified: Secondary | ICD-10-CM

## 2013-11-26 DIAGNOSIS — E538 Deficiency of other specified B group vitamins: Secondary | ICD-10-CM

## 2013-11-26 DIAGNOSIS — Z760 Encounter for issue of repeat prescription: Secondary | ICD-10-CM | POA: Insufficient documentation

## 2013-11-26 DIAGNOSIS — I2510 Atherosclerotic heart disease of native coronary artery without angina pectoris: Secondary | ICD-10-CM | POA: Diagnosis not present

## 2013-11-26 LAB — COMPREHENSIVE METABOLIC PANEL
ALT: 20 U/L (ref 0–35)
AST: 18 U/L (ref 0–37)
Albumin: 4.4 g/dL (ref 3.5–5.2)
Alkaline Phosphatase: 70 U/L (ref 39–117)
BILIRUBIN TOTAL: 0.9 mg/dL (ref 0.2–1.2)
BUN: 15 mg/dL (ref 6–23)
CHLORIDE: 100 meq/L (ref 96–112)
CO2: 31 mEq/L (ref 19–32)
CREATININE: 0.78 mg/dL (ref 0.50–1.10)
Calcium: 10 mg/dL (ref 8.4–10.5)
Glucose, Bld: 98 mg/dL (ref 70–99)
Potassium: 3.8 mEq/L (ref 3.5–5.3)
Sodium: 136 mEq/L (ref 135–145)
Total Protein: 6.8 g/dL (ref 6.0–8.3)

## 2013-11-26 LAB — LIPID PANEL
CHOL/HDL RATIO: 3.4 ratio
Cholesterol: 180 mg/dL (ref 0–200)
HDL: 53 mg/dL (ref >39)
LDL CALC: 91 mg/dL (ref 0–99)
TRIGLYCERIDES: 178 mg/dL — AB (ref <150)
VLDL: 36 mg/dL (ref 0–40)

## 2013-11-26 LAB — CBC WITH DIFFERENTIAL/PLATELET
Basophils Absolute: 0 10*3/uL (ref 0.0–0.1)
Basophils Relative: 0 % (ref 0–1)
Eosinophils Absolute: 0.1 10*3/uL (ref 0.0–0.7)
Eosinophils Relative: 1 % (ref 0–5)
HEMATOCRIT: 41.2 % (ref 36.0–46.0)
HEMOGLOBIN: 14.1 g/dL (ref 12.0–15.0)
LYMPHS PCT: 11 % — AB (ref 12–46)
Lymphs Abs: 0.9 10*3/uL (ref 0.7–4.0)
MCH: 30.3 pg (ref 26.0–34.0)
MCHC: 34.2 g/dL (ref 30.0–36.0)
MCV: 88.4 fL (ref 78.0–100.0)
MONO ABS: 0.5 10*3/uL (ref 0.1–1.0)
MONOS PCT: 7 % (ref 3–12)
Neutro Abs: 6.3 10*3/uL (ref 1.7–7.7)
Neutrophils Relative %: 81 % — ABNORMAL HIGH (ref 43–77)
Platelets: 197 10*3/uL (ref 150–400)
RBC: 4.66 MIL/uL (ref 3.87–5.11)
RDW: 14.3 % (ref 11.5–15.5)
WBC: 7.8 10*3/uL (ref 4.0–10.5)

## 2013-11-26 LAB — HEMOGLOBIN A1C
Hgb A1c MFr Bld: 5.8 % — ABNORMAL HIGH (ref <5.7)
Mean Plasma Glucose: 120 mg/dL — ABNORMAL HIGH (ref <117)

## 2013-11-26 MED ORDER — HYDROXYZINE PAMOATE 100 MG PO CAPS: 100.0000 mg | ORAL_CAPSULE | Freq: Three times a day (TID) | ORAL | Status: DC | PRN

## 2013-11-26 MED ORDER — LEVOFLOXACIN 500 MG PO TABS: 500.0000 mg | ORAL_TABLET | Freq: Every day | ORAL | Status: DC

## 2013-11-26 NOTE — Patient Instructions (Signed)

## 2013-11-27 LAB — TSH: TSH: 0.852 u[IU]/mL (ref 0.350–4.500)

## 2013-11-29 DIAGNOSIS — M533 Sacrococcygeal disorders, not elsewhere classified: Secondary | ICD-10-CM | POA: Diagnosis not present

## 2013-11-29 DIAGNOSIS — R071 Chest pain on breathing: Secondary | ICD-10-CM | POA: Diagnosis not present

## 2013-11-29 DIAGNOSIS — IMO0002 Reserved for concepts with insufficient information to code with codable children: Secondary | ICD-10-CM | POA: Diagnosis not present

## 2013-12-02 DIAGNOSIS — H5340 Unspecified visual field defects: Secondary | ICD-10-CM | POA: Diagnosis not present

## 2013-12-02 DIAGNOSIS — H5347 Heteronymous bilateral field defects: Secondary | ICD-10-CM | POA: Diagnosis not present

## 2013-12-02 DIAGNOSIS — H53469 Homonymous bilateral field defects, unspecified side: Secondary | ICD-10-CM | POA: Diagnosis not present

## 2013-12-02 DIAGNOSIS — J0120 Acute ethmoidal sinusitis, unspecified: Secondary | ICD-10-CM | POA: Diagnosis not present

## 2013-12-03 ENCOUNTER — Telehealth: Payer: Self-pay | Admitting: Internal Medicine

## 2013-12-03 ENCOUNTER — Other Ambulatory Visit (HOSPITAL_COMMUNITY): Payer: Self-pay | Admitting: Ophthalmology

## 2013-12-03 DIAGNOSIS — J329 Chronic sinusitis, unspecified: Secondary | ICD-10-CM

## 2013-12-03 NOTE — Telephone Encounter (Signed)
Pt has taken all antibiotic meds prescribed and says still has symptoms, congestion, runny nose.  Pt is scheduled to have a CAT scan next and was told by specialist, Dr Zadie Rhine, that she needs to have symptoms cleared (and sinisus) in order to have CAT scan.  Please f/u with pt regarding meds.

## 2013-12-04 ENCOUNTER — Telehealth: Payer: Self-pay

## 2013-12-04 NOTE — Telephone Encounter (Signed)
Patient not available  No answering machine Unable to leave message

## 2013-12-05 ENCOUNTER — Encounter (HOSPITAL_COMMUNITY): Payer: Self-pay

## 2013-12-05 ENCOUNTER — Ambulatory Visit (HOSPITAL_COMMUNITY)
Admission: RE | Admit: 2013-12-05 | Discharge: 2013-12-05 | Disposition: A | Discharge status: Home / Self Care | Payer: Medicare Other | Source: Ambulatory Visit | Attending: Ophthalmology | Admitting: Ophthalmology

## 2013-12-05 ENCOUNTER — Telehealth: Payer: Self-pay | Admitting: Internal Medicine

## 2013-12-05 DIAGNOSIS — J322 Chronic ethmoidal sinusitis: Secondary | ICD-10-CM | POA: Diagnosis not present

## 2013-12-05 DIAGNOSIS — J3489 Other specified disorders of nose and nasal sinuses: Secondary | ICD-10-CM | POA: Insufficient documentation

## 2013-12-05 DIAGNOSIS — R51 Headache: Secondary | ICD-10-CM | POA: Insufficient documentation

## 2013-12-05 DIAGNOSIS — J329 Chronic sinusitis, unspecified: Secondary | ICD-10-CM

## 2013-12-05 DIAGNOSIS — J320 Chronic maxillary sinusitis: Secondary | ICD-10-CM | POA: Diagnosis not present

## 2013-12-05 NOTE — Telephone Encounter (Signed)
Pt has come in today to get a refill for levofloxacin (LEVAQUIN) 500 MG tablet; pt has been told to continue the antibiotic for another week; pt would like script filled at [facility] on [address]

## 2013-12-06 ENCOUNTER — Telehealth: Payer: Self-pay | Admitting: *Deleted

## 2013-12-06 NOTE — Telephone Encounter (Signed)
I spoke to pt and she said that the RX was at her pharmacy.

## 2013-12-07 NOTE — Progress Notes (Signed)
Patient ID: [name], female   DOB: January 09, 1951, 63 y.o.   MRN: [id number]   CC: follow up   HPI: pt is 63 yo female, presents to clinic requesting refill on medications, no concerns today.   Allergies  Allergen Reactions  . Amoxicillin Swelling    Throat Swells  . Azithromycin Swelling    Throat Swelling  . Bromfed Swelling    Throat Swelling  . Cephalexin Swelling    Throat Swelling  . Chlordiazepoxide-Clidinium Swelling    Throat Swelling  . Clotrimazole Other (See Comments) and Hypertension    Patient told me that she couldn't swallow due to the medication  . Dicyclomine Hcl Hives  . Hydralazine Hcl     Rash and itching  . Ibuprofen     N/V  . Iohexol      Code: HIVES, Desc: throat swelling no hives 20 yrs ago;needs pre-medication  09/19/07 sg, Onset Date: [id number]   . Latex Swelling    Blisters on Skin  . Lidocaine Hives  . Paroxetine Swelling    Throat Swelling  . Penicillins Hives  . Propoxyphene N-Acetaminophen Swelling    REACTION: swelling in the throat  . Sertraline Hcl Swelling    Throat Swelling  . Sulfadiazine Swelling    Throat Swelling  . Verapamil Swelling    Throat Swelling   Past Medical History  Diagnosis Date  . Myocardial infarct 2005  . CAD (coronary artery disease)   . Hypertension   . Long term (current) use of anticoagulants   . Edema   . Hematoma   . Sinusitis acute   . Adenocarcinoma of breast     right  . Acute upper respiratory infections of unspecified site   . Depression   . Anal fissure   . GERD (gastroesophageal reflux disease)   . Dog bite(E906.0)   . Diverticulosis of colon (without mention of hemorrhage)   . Spinal stenosis, lumbar region, without neurogenic claudication   . Anxiety   . Panic attacks   . Asthma   . Irritable bowel syndrome   . Hiatal hernia   . Jaundice     Hx of Jaundice at age 74 from "dirty restuarant". Unsure of Hepatitis type   Current Outpatient Prescriptions on File Prior to Visit   Medication Sig Dispense Refill  . acetaminophen (TYLENOL) 500 MG tablet Take 500 mg by mouth every 8 (eight) hours as needed (pain).       Marland Kitchen ALPRAZolam (XANAX) 1 MG tablet Take 0.5-1 tablets (0.5-1 mg total) by mouth 2 (two) times daily as needed for anxiety or sleep.  60 tablet  5  . ascorbic acid (VITAMIN C) 1000 MG tablet Take 1,000 mg by mouth daily.      Marland Kitchen aspirin 81 MG tablet Take 81 mg by mouth daily.        . Calcium Carbonate-Vitamin D (CALCIUM-CARB 600 + D) 600-125 MG-UNIT TABS Take 1 capsule by mouth daily.       . cycloSPORINE (RESTASIS) 0.05 % ophthalmic emulsion Place 1 drop into both eyes 2 (two) times daily.        . Fluticasone-Salmeterol (ADVAIR) 250-50 MCG/DOSE AEPB Inhale 1 puff into the lungs 2 (two) times daily.  60 each  0  . gabapentin (NEURONTIN) 300 MG capsule Take 1 capsule (300 mg total) by mouth at bedtime. Take 1 cap at bedtime then 2 caps times 7 days then 3 caps qhs  30 capsule  0  . Ipratropium-Albuterol (COMBIVENT) 20-100 MCG/ACT  AERS respimat Inhale 1 puff into the lungs every 6 (six) hours as needed for wheezing or shortness of breath.  1 Inhaler  0  . Linaclotide (LINZESS) 290 MCG CAPS capsule Take 1 capsule (290 mcg total) by mouth daily.  12 capsule  0  . metoprolol (LOPRESSOR) 50 MG tablet Take 1 tablet (50 mg total) by mouth daily.  30 tablet  3  . pantoprazole (PROTONIX) 40 MG tablet Take 40 mg by mouth daily.       . Probiotic Product (PROBIOTIC DAILY) CAPS Take 1 capsule by mouth daily.      . rifaximin (XIFAXAN) 550 MG TABS tablet Take 1 tablet (550 mg total) by mouth 2 (two) times daily.  20 tablet  0  . sodium chloride (OCEAN) 0.65 % nasal spray Place 1 spray into the nose as needed for congestion.      . valsartan (DIOVAN) 320 MG tablet Take in am daily  60 tablet  1  . vitamin E (VITAMIN E) 400 UNIT capsule Take 400 Units by mouth daily.      . VOLTAREN 1 % GEL Apply 2 g topically 2 (two) times daily as needed (pain).       .  Camphor-Menthol-Methyl Sal (SALONPAS) 1.2-5.7-6.3 % PTCH Apply 3 patches topically daily. Removes patches at night      . mupirocin ointment (BACTROBAN) 2 % Place 1 application into the nose 2 (two) times daily.  22 g  0  . pravastatin (PRAVACHOL) 40 MG tablet Take 40 mg by mouth every evening.        No current facility-administered medications on file prior to visit.   Family History  Problem Relation Age of Onset  . Hypertension Mother   . Heart disease Mother   . Dementia Mother   . Arthritis Mother   . Diabetes Mother   . Prostate cancer Father   . Lung cancer Maternal Uncle   . Hypertension Father   . Hypertension Sister   . Hypertension Brother   . Heart disease Sister   . Rectal cancer Neg Hx   . Stomach cancer Neg Hx   . Colon cancer Cousin   . Colon polyps Mother   . Colon polyps Father   . Inflammatory bowel disease Sister    History   Social History  . Marital Status: Divorced    Spouse Name: N/A    Number of Children: 45  . Years of Education: N/A   Occupational History  . Teacher    Social History Main Topics  . Smoking status: Former Smoker -- 0.30 packs/day for 8 years    Types: Cigarettes    Quit date: 09/13/1983  . Smokeless tobacco: Never Used  . Alcohol Use: No  . Drug Use: No  . Sexual Activity: Not Currently   Other Topics Concern  . Not on file   Social History Narrative  . No narrative on file    Review of Systems  Constitutional: Negative for fever, chills, diaphoresis, activity change, appetite change and fatigue.  HENT: Negative for ear pain, nosebleeds, congestion, facial swelling, rhinorrhea, neck pain, neck stiffness and ear discharge.   Eyes: Negative for pain, discharge, redness, itching and visual disturbance.  Respiratory: Negative for cough, choking, chest tightness, shortness of breath, wheezing and stridor.   Cardiovascular: Negative for chest pain, palpitations and leg swelling.  Gastrointestinal: Negative for abdominal  distention.  Genitourinary: Negative for dysuria, urgency, frequency, hematuria, flank pain, decreased urine volume, difficulty urinating and dyspareunia.  Musculoskeletal: Negative for back pain, joint swelling, arthralgias and gait problem.  Neurological: Negative for dizziness, tremors, seizures, syncope, facial asymmetry, speech difficulty, weakness, light-headedness, numbness and headaches.  Hematological: Negative for adenopathy. Does not bruise/bleed easily.  Psychiatric/Behavioral: Negative for hallucinations, behavioral problems, confusion, dysphoric mood, decreased concentration and agitation.    Objective:   Filed Vitals:   11/26/13 1013  BP: 167/100  Pulse: 85  Temp: 98 F (36.7 C)    Physical Exam  Constitutional: Appears well-developed and well-nourished. No distress.  HENT: Normocephalic. External right and left ear normal. Oropharynx is clear and moist.  Eyes: Conjunctivae and EOM are normal. PERRLA, no scleral icterus.  Neck: Normal ROM. Neck supple. No JVD. No tracheal deviation. No thyromegaly.  CVS: RRR, S1/S2 +, no murmurs, no gallops, no carotid bruit.  Pulmonary: Effort and breath sounds normal, no stridor, rhonchi, wheezes, rales.  Abdominal: Soft. BS +,  no distension, tenderness, rebound or guarding.  Musculoskeletal: Normal range of motion. No edema and no tenderness.  Lymphadenopathy: No lymphadenopathy noted, cervical, inguinal. Neuro: Alert. Normal reflexes, muscle tone coordination. No cranial nerve deficit. Skin: Skin is warm and dry. No rash noted. Not diaphoretic. No erythema. No pallor.  Psychiatric: Normal mood and affect. Behavior, judgment, thought content normal.   Lab Results  Component Value Date   WBC 7.8 11/26/2013   HGB 14.1 11/26/2013   HCT 41.2 11/26/2013   MCV 88.4 11/26/2013   PLT 197 11/26/2013   Lab Results  Component Value Date   CREATININE 0.78 11/26/2013   BUN 15 11/26/2013   NA 136 11/26/2013   K 3.8 11/26/2013   CL 100  11/26/2013   CO2 31 11/26/2013    Lab Results  Component Value Date   HGBA1C 5.8* 11/26/2013   Lipid Panel     Component Value Date/Time   CHOL 180 11/26/2013 1032   TRIG 178* 11/26/2013 1032   HDL 53 11/26/2013 1032   CHOLHDL 3.4 11/26/2013 1032   VLDL 36 11/26/2013 1032   LDLCALC 91 11/26/2013 1032       Assessment and plan:   Patient Active Problem List   Diagnosis Date Noted  . HTN (hypertension) - BP above target range, this was discussed in detail, pt advised to check BP regularly and to call us back if the numbers are > 140/90 so that we can readjust the regimen appropriately  09/23/2013

## 2013-12-23 ENCOUNTER — Encounter: Payer: Self-pay | Admitting: Internal Medicine

## 2013-12-23 ENCOUNTER — Ambulatory Visit: Payer: Medicare Other | Attending: Internal Medicine | Admitting: Internal Medicine

## 2013-12-23 VITALS — BP 154/88 | HR 68 | Temp 98.5°F | Resp 16 | Ht 63.0 in | Wt 181.0 lb

## 2013-12-23 DIAGNOSIS — F411 Generalized anxiety disorder: Secondary | ICD-10-CM | POA: Diagnosis not present

## 2013-12-23 DIAGNOSIS — E785 Hyperlipidemia, unspecified: Secondary | ICD-10-CM | POA: Diagnosis not present

## 2013-12-23 DIAGNOSIS — I10 Essential (primary) hypertension: Secondary | ICD-10-CM | POA: Diagnosis not present

## 2013-12-23 DIAGNOSIS — J45909 Unspecified asthma, uncomplicated: Secondary | ICD-10-CM | POA: Diagnosis not present

## 2013-12-23 DIAGNOSIS — I2510 Atherosclerotic heart disease of native coronary artery without angina pectoris: Secondary | ICD-10-CM | POA: Diagnosis not present

## 2013-12-23 MED ORDER — METOPROLOL TARTRATE 50 MG PO TABS: 50.0000 mg | ORAL_TABLET | Freq: Every day | ORAL | Status: DC

## 2013-12-23 MED ORDER — FLUTICASONE-SALMETEROL 250-50 MCG/DOSE IN AEPB: 1.0000 | INHALATION_SPRAY | Freq: Two times a day (BID) | RESPIRATORY_TRACT | Status: DC

## 2013-12-23 MED ORDER — ALPRAZOLAM 1 MG PO TABS: 0.5000 mg | ORAL_TABLET | Freq: Two times a day (BID) | ORAL | Status: DC | PRN

## 2013-12-23 MED ORDER — IPRATROPIUM-ALBUTEROL 20-100 MCG/ACT IN AERS: 1.0000 | INHALATION_SPRAY | Freq: Four times a day (QID) | RESPIRATORY_TRACT | Status: DC | PRN

## 2013-12-23 MED ORDER — VALSARTAN 320 MG PO TABS: ORAL_TABLET | ORAL | Status: DC

## 2013-12-23 NOTE — Patient Instructions (Signed)

## 2013-12-23 NOTE — Progress Notes (Signed)
Pt is here following up on her HTN. Pt states the her legs are in pain and swelling. Pt is requesting to have her ears checked.

## 2013-12-27 ENCOUNTER — Telehealth: Payer: Self-pay | Admitting: Oncology

## 2013-12-27 NOTE — Progress Notes (Signed)
Patient ID: [name], female   DOB: [birth year], 63 y.o.   MRN: [id number]   [name], is a 63 y.o. female  ACZ:[id number]  FUX:[id number]  DOB - December 09, 1950  Chief Complaint  Patient presents with  . Follow-up        Subjective:   [name] is a 63 y.o. female here today for a follow up visit. Patient has extensive medical history for hypertension, coronary artery, irritable bowel syndrome, asthma, dyslipidemia, and GERD. She is here today following up on her hypertension. She claims compliant with medications, her blood pressure today is 154/88 but patient claims this is slightly high at her numbers at home. She has no new complaint. She needs refill on her medications including that for anxiety and her inhalers. Patient has No headache, No chest pain, No abdominal pain - No Nausea, No new weakness tingling or numbness, No Cough - SOB.  No problems updated.  ALLERGIES: Allergies  Allergen Reactions  . Amoxicillin Swelling    Throat Swells  . Azithromycin Swelling    Throat Swelling  . Bromfed Swelling    Throat Swelling  . Cephalexin Swelling    Throat Swelling  . Chlordiazepoxide-Clidinium Swelling    Throat Swelling  . Clotrimazole Other (See Comments) and Hypertension    Patient told me that she couldn't swallow due to the medication  . Dicyclomine Hcl Hives  . Hydralazine Hcl     Rash and itching  . Ibuprofen     N/V  . Iohexol      Code: HIVES, Desc: throat swelling no hives 20 yrs ago;needs pre-medication  09/19/07 sg, Onset Date: [id number]   . Latex Swelling    Blisters on Skin  . Lidocaine Hives  . Paroxetine Swelling    Throat Swelling  . Penicillins Hives  . Propoxyphene N-Acetaminophen Swelling    REACTION: swelling in the throat  . Sertraline Hcl Swelling    Throat Swelling  . Sulfadiazine Swelling    Throat Swelling  . Verapamil Swelling    Throat Swelling    PAST MEDICAL HISTORY: Past Medical History  Diagnosis Date  . Myocardial  infarct 2005  . CAD (coronary artery disease)   . Hypertension   . Long term (current) use of anticoagulants   . Edema   . Hematoma   . Sinusitis acute   . Adenocarcinoma of breast     right  . Acute upper respiratory infections of unspecified site   . Depression   . Anal fissure   . GERD (gastroesophageal reflux disease)   . Dog bite(E906.0)   . Diverticulosis of colon (without mention of hemorrhage)   . Spinal stenosis, lumbar region, without neurogenic claudication   . Anxiety   . Panic attacks   . Asthma   . Irritable bowel syndrome   . Hiatal hernia   . Jaundice     Hx of Jaundice at age 49 from "dirty restuarant". Unsure of Hepatitis type    MEDICATIONS AT HOME: Prior to Admission medications   Medication Sig Start Date End Date Taking? Authorizing Provider  acetaminophen (TYLENOL) 500 MG tablet Take 500 mg by mouth every 8 (eight) hours as needed (pain).    Yes Historical Provider, MD  ALPRAZolam Duanne Moron) 1 MG tablet Take 0.5-1 tablets (0.5-1 mg total) by mouth 2 (two) times daily as needed for anxiety or sleep. 12/23/13  Yes Angelica Chessman, MD  ascorbic acid (VITAMIN C) 1000 MG tablet Take 1,000 mg by mouth daily.   Yes  Historical Provider, MD  aspirin 81 MG tablet Take 81 mg by mouth daily.     Yes Historical Provider, MD  Calcium Carbonate-Vitamin D (CALCIUM-CARB 600 + D) 600-125 MG-UNIT TABS Take 1 capsule by mouth daily.    Yes Historical Provider, MD  metoprolol (LOPRESSOR) 50 MG tablet Take 1 tablet (50 mg total) by mouth daily. 12/23/13  Yes Angelica Chessman, MD  pantoprazole (PROTONIX) 40 MG tablet Take 40 mg by mouth daily.  07/24/13  Yes Historical Provider, MD  pravastatin (PRAVACHOL) 40 MG tablet Take 40 mg by mouth every evening.  06/11/13  Yes Historical Provider, MD  sodium chloride (OCEAN) 0.65 % nasal spray Place 1 spray into the nose as needed for congestion.   Yes Historical Provider, MD  valsartan (DIOVAN) 320 MG tablet Take in am daily 12/23/13  Yes  Angelica Chessman, MD  vitamin E (VITAMIN E) 400 UNIT capsule Take 400 Units by mouth daily.   Yes Historical Provider, MD  VOLTAREN 1 % GEL Apply 2 g topically 2 (two) times daily as needed (pain).  06/30/13  Yes Historical Provider, MD  Camphor-Menthol-Methyl Sal (SALONPAS) 1.2-5.7-6.3 % PTCH Apply 3 patches topically daily. Removes patches at night    Historical Provider, MD  cycloSPORINE (RESTASIS) 0.05 % ophthalmic emulsion Place 1 drop into both eyes 2 (two) times daily.      Historical Provider, MD  Fluticasone-Salmeterol (ADVAIR) 250-50 MCG/DOSE AEPB Inhale 1 puff into the lungs 2 (two) times daily. 12/23/13   Angelica Chessman, MD  gabapentin (NEURONTIN) 300 MG capsule Take 1 capsule (300 mg total) by mouth at bedtime. Take 1 cap at bedtime then 2 caps times 7 days then 3 caps qhs 08/19/13   Janece Canterbury, MD  hydrOXYzine (VISTARIL) 100 MG capsule Take 1 capsule (100 mg total) by mouth 3 (three) times daily as needed for itching (allergies). 11/26/13   Theodis Blaze, MD  Ipratropium-Albuterol (COMBIVENT) 20-100 MCG/ACT AERS respimat Inhale 1 puff into the lungs every 6 (six) hours as needed for wheezing or shortness of breath. 12/23/13   Angelica Chessman, MD  Linaclotide (LINZESS) 290 MCG CAPS capsule Take 1 capsule (290 mcg total) by mouth daily. 08/16/13   Sable Feil, MD  Probiotic Product (PROBIOTIC DAILY) CAPS Take 1 capsule by mouth daily.    Historical Provider, MD  rifaximin (XIFAXAN) 550 MG TABS tablet Take 1 tablet (550 mg total) by mouth 2 (two) times daily. 08/16/13   Sable Feil, MD     Objective:   Filed Vitals:   12/23/13 1244  BP: 154/88  Pulse: 68  Temp: 98.5 F (36.9 C)  TempSrc: Oral  Resp: 16  Height: 5\' 3"  (1.6 m)  Weight: 181 lb (82.101 kg)  SpO2: 99%    Exam General appearance : Awake, alert, not in any distress. Speech Clear. Not toxic looking HEENT: Atraumatic and Normocephalic, pupils equally reactive to light and accomodation Neck: supple,  no JVD. No cervical lymphadenopathy.  Chest:Good air entry bilaterally, no added sounds  CVS: S1 S2 regular, no murmurs.  Abdomen: Bowel sounds present, Non tender and not distended with no gaurding, rigidity or rebound. Extremities: B/L Lower Ext shows no edema, both legs are warm to touch Neurology: Awake alert, and oriented X 3, CN II-XII intact, Non focal Skin:No Rash Wounds:N/A  Data Review Lab Results  Component Value Date   HGBA1C 5.8* 11/26/2013   HGBA1C 6.2 06/10/2011   HGBA1C  Value: 5.9 (NOTE)  According to the ADA Clinical Practice Recommendations for 2011, when HbA1c is used as a screening test:   >=6.5%   Diagnostic of Diabetes Mellitus           (if abnormal result  is confirmed)  5.7-6.4%   Increased risk of developing Diabetes Mellitus  References:Diagnosis and Classification of Diabetes Mellitus,Diabetes WGNF,6213,08(MVHQI 1):S62-S69 and Standards of Medical Care in         Diabetes - 2011,Diabetes ONGE,9528,41  (Suppl 1):S11-S61.* 07/16/2010     Assessment & Plan   1. HTN (hypertension) Refill - metoprolol (LOPRESSOR) 50 MG tablet; Take 1 tablet (50 mg total) by mouth daily.  Dispense: 90 tablet; Refill: 3 - valsartan (DIOVAN) 320 MG tablet; Take in am daily  Dispense: 90 tablet; Refill: 3  2. HLD (hyperlipidemia) Continue pravastatin 40 mg tablet by mouth daily  3. Anxiety state, unspecified Refill - ALPRAZolam (XANAX) 1 MG tablet; Take 0.5-1 tablets (0.5-1 mg total) by mouth 2 (two) times daily as needed for anxiety or sleep.  Dispense: 60 tablet; Refill: 3  4. Asthma, chronic Refill - Ipratropium-Albuterol (COMBIVENT) 20-100 MCG/ACT AERS respimat; Inhale 1 puff into the lungs every 6 (six) hours as needed for wheezing or shortness of breath.  Dispense: 1 Inhaler; Refill: 1 - Fluticasone-Salmeterol (ADVAIR) 250-50 MCG/DOSE AEPB; Inhale 1 puff into the lungs 2 (two) times daily.  Dispense: 60  each; Refill: 0  Patient was extensively counseled on nutrition and exercise  Return in about 3 months (around 03/24/2014), or if symptoms worsen or fail to improve, for Follow up HTN.  The patient was given clear instructions to go to ER or return to medical center if symptoms don't improve, worsen or new problems develop. The patient verbalized understanding. The patient was told to call to get lab results if they haven't heard anything in the next week.   This note has been created with Surveyor, quantity. Any transcriptional errors are unintentional.    Angelica Chessman, MD, [place], [place], [place] [facility] [place], [place]   12/27/2013, 1:36 PM

## 2013-12-27 NOTE — Telephone Encounter (Signed)
, °

## 2014-01-08 ENCOUNTER — Telehealth: Payer: Self-pay | Admitting: Internal Medicine

## 2014-01-08 NOTE — Telephone Encounter (Signed)
It called requesting a refill of her medication doxycycline, pt states that the doctor has given this medication to her before for her Sinus infection and she know that she is starting to get a sinus infection. Please contact pt

## 2014-01-09 ENCOUNTER — Ambulatory Visit: Payer: Medicare Other | Attending: Family Medicine | Admitting: Family Medicine

## 2014-01-09 ENCOUNTER — Encounter: Payer: Self-pay | Admitting: Family Medicine

## 2014-01-09 VITALS — BP 163/94 | HR 80 | Temp 98.1°F | Resp 17 | Wt 186.8 lb

## 2014-01-09 DIAGNOSIS — F17200 Nicotine dependence, unspecified, uncomplicated: Secondary | ICD-10-CM | POA: Diagnosis not present

## 2014-01-09 DIAGNOSIS — J309 Allergic rhinitis, unspecified: Secondary | ICD-10-CM | POA: Diagnosis not present

## 2014-01-09 DIAGNOSIS — M7989 Other specified soft tissue disorders: Secondary | ICD-10-CM | POA: Insufficient documentation

## 2014-01-09 DIAGNOSIS — I2510 Atherosclerotic heart disease of native coronary artery without angina pectoris: Secondary | ICD-10-CM

## 2014-01-09 DIAGNOSIS — J45909 Unspecified asthma, uncomplicated: Secondary | ICD-10-CM | POA: Diagnosis not present

## 2014-01-09 DIAGNOSIS — R609 Edema, unspecified: Secondary | ICD-10-CM

## 2014-01-09 LAB — CBC WITH DIFFERENTIAL/PLATELET
Basophils Absolute: 0 10*3/uL (ref 0.0–0.1)
Basophils Relative: 1 % (ref 0–1)
Eosinophils Absolute: 0.1 10*3/uL (ref 0.0–0.7)
Eosinophils Relative: 3 % (ref 0–5)
HEMATOCRIT: 39 % (ref 36.0–46.0)
HEMOGLOBIN: 13 g/dL (ref 12.0–15.0)
LYMPHS PCT: 38 % (ref 12–46)
Lymphs Abs: 1.4 10*3/uL (ref 0.7–4.0)
MCH: 29.1 pg (ref 26.0–34.0)
MCHC: 33.3 g/dL (ref 30.0–36.0)
MCV: 87.4 fL (ref 78.0–100.0)
MONO ABS: 0.3 10*3/uL (ref 0.1–1.0)
MONOS PCT: 9 % (ref 3–12)
Neutro Abs: 1.9 10*3/uL (ref 1.7–7.7)
Neutrophils Relative %: 49 % (ref 43–77)
Platelets: 208 10*3/uL (ref 150–400)
RBC: 4.46 MIL/uL (ref 3.87–5.11)
RDW: 14.1 % (ref 11.5–15.5)
WBC: 3.8 10*3/uL — AB (ref 4.0–10.5)

## 2014-01-09 LAB — COMPREHENSIVE METABOLIC PANEL
ALBUMIN: 4.1 g/dL (ref 3.5–5.2)
ALT: 16 U/L (ref 0–35)
AST: 17 U/L (ref 0–37)
Alkaline Phosphatase: 59 U/L (ref 39–117)
BUN: 17 mg/dL (ref 6–23)
CALCIUM: 9.4 mg/dL (ref 8.4–10.5)
CHLORIDE: 105 meq/L (ref 96–112)
CO2: 28 mEq/L (ref 19–32)
Creat: 0.81 mg/dL (ref 0.50–1.10)
GLUCOSE: 111 mg/dL — AB (ref 70–99)
POTASSIUM: 4 meq/L (ref 3.5–5.3)
Sodium: 141 mEq/L (ref 135–145)
Total Bilirubin: 0.7 mg/dL (ref 0.2–1.2)
Total Protein: 6.2 g/dL (ref 6.0–8.3)

## 2014-01-09 LAB — TSH: TSH: 1.416 u[IU]/mL (ref 0.350–4.500)

## 2014-01-09 MED ORDER — CETIRIZINE HCL 10 MG PO CAPS: 1.0000 | ORAL_CAPSULE | Freq: Every day | ORAL | Status: DC

## 2014-01-09 NOTE — Assessment & Plan Note (Addendum)
Mild bilateral.  No signs of chf or dvt.  Check labs and treat for venous insufficiency

## 2014-01-09 NOTE — Patient Instructions (Signed)
Elevate you feet at night TED stockings Continue using your inhalers as needed Return in three month or sooner if needed       Allergies Allergies may happen from anything your body is sensitive to. This may be food, medicines, pollens, chemicals, and nearly anything around you in everyday life that produces allergens. An allergen is anything that causes an allergy producing substance. Heredity is often a factor in causing these problems. This means you may have some of the same allergies as your parents. Food allergies happen in all age groups. Food allergies are some of the most severe and life threatening. Some common food allergies are cow's milk, seafood, eggs, nuts, wheat, and soybeans. SYMPTOMS   Swelling around the mouth.  An itchy red rash or hives.  Vomiting or diarrhea.  Difficulty breathing. SEVERE ALLERGIC REACTIONS ARE LIFE-THREATENING. This reaction is called anaphylaxis. It can cause the mouth and throat to swell and cause difficulty with breathing and swallowing. In severe reactions only a trace amount of food (for example, peanut oil in a salad) may cause death within seconds. Seasonal allergies occur in all age groups. These are seasonal because they usually occur during the same season every year. They may be a reaction to molds, grass pollens, or tree pollens. Other causes of problems are house dust mite allergens, pet dander, and mold spores. The symptoms often consist of nasal congestion, a runny itchy nose associated with sneezing, and tearing itchy eyes. There is often an associated itching of the mouth and ears. The problems happen when you come in contact with pollens and other allergens. Allergens are the particles in the air that the body reacts to with an allergic reaction. This causes you to release allergic antibodies. Through a chain of events, these eventually cause you to release histamine into the blood stream. Although it is meant to be protective to the  body, it is this release that causes your discomfort. This is why you were given anti-histamines to feel better. If you are unable to pinpoint the offending allergen, it may be determined by skin or blood testing. Allergies cannot be cured but can be controlled with medicine. Hay fever is a collection of all or some of the seasonal allergy problems. It may often be treated with simple over-the-counter medicine such as diphenhydramine. Take medicine as directed. Do not drink alcohol or drive while taking this medicine. Check with your caregiver or package insert for child dosages. If these medicines are not effective, there are many new medicines your caregiver can prescribe. Stronger medicine such as nasal spray, eye drops, and corticosteroids may be used if the first things you try do not work well. Other treatments such as immunotherapy or desensitizing injections can be used if all else fails. Follow up with your caregiver if problems continue. These seasonal allergies are usually not life threatening. They are generally more of a nuisance that can often be handled using medicine. HOME CARE INSTRUCTIONS   If unsure what causes a reaction, keep a diary of foods eaten and symptoms that follow. Avoid foods that cause reactions.  If hives or rash are present:  Take medicine as directed.  You may use an over-the-counter antihistamine (diphenhydramine) for hives and itching as needed.  Apply cold compresses (cloths) to the skin or take baths in cool water. Avoid hot baths or showers. Heat will make a rash and itching worse.  If you are severely allergic:  Following a treatment for a severe reaction, hospitalization is often  required for closer follow-up.  Wear a medic-alert bracelet or necklace stating the allergy.  You and your family must learn how to give adrenaline or use an anaphylaxis kit.  If you have had a severe reaction, always carry your anaphylaxis kit or EpiPen with you. Use this  medicine as directed by your caregiver if a severe reaction is occurring. Failure to do so could have a fatal outcome. SEEK MEDICAL CARE IF:  You suspect a food allergy. Symptoms generally happen within 30 minutes of eating a food.  Your symptoms have not gone away within 2 days or are getting worse.  You develop new symptoms.  You want to retest yourself or your child with a food or drink you think causes an allergic reaction. Never do this if an anaphylactic reaction to that food or drink has happened before. Only do this under the care of a caregiver. SEEK IMMEDIATE MEDICAL CARE IF:   You have difficulty breathing, are wheezing, or have a tight feeling in your chest or throat.  You have a swollen mouth, or you have hives, swelling, or itching all over your body.  You have had a severe reaction that has responded to your anaphylaxis kit or an EpiPen. These reactions may return when the medicine has worn off. These reactions should be considered life threatening. MAKE SURE YOU:   Understand these instructions.  Will watch your condition.  Will get help right away if you are not doing well or get worse. Document Released: 11/22/2002 Document Revised: 12/24/2012 Document Reviewed: 04/28/2008 [facility] Patient Information 2014 [place].

## 2014-01-09 NOTE — Assessment & Plan Note (Signed)
No wheezing on exam.  I think symptoms more consistent with allergic rhinitis than asthma.  Treat with antihistamine

## 2014-01-09 NOTE — Progress Notes (Addendum)
   Subjective:    Patient ID: [name], female    DOB: Apr 13, 1951, 63 y.o.   MRN: [id number]   HPI   Feeling stuffy and congested and mild shortness of breath for about a week  ASTHMA Disease monitoring Does your asthma bother you are night (coughing or wheezing): no Does your asthma bother you while exercising (coughing or wheezing): yes Does your asthma keep you from doing anything:  walking When were you last in the ER or hospital for asthma: december  Medication Management How often do you use your rescue inhaler (albuterol): BID How often do you use your steroid inhaler BID Do you have any mouth soreness: no  Allergy Symptoms Patient complains of having a build up of fluid in her ears Has some pressure around her left eye Some soreness in her neck  Feet Swelling Worse in evening better in AM. No chest pain with exertion Has gained weight.  No orthopnea.  Watches salt intake   Review of Symptoms - see HPI PMH - Smoking status noted.       Review of Systems     Objective:   Physical Exam    Ears:  External ear exam shows no significant lesions or deformities.  Otoscopic examination reveals clear canals, tympanic membranes are intact in right ear without bulging, retraction, inflammation or discharge. Hearing is grossly normal bilaterall - left ear TM cloudy  Neck:  No deformities, thyromegaly, masses, or tenderness noted.   Supple with full range of motion without pain. Lungs:  Normal respiratory effort, chest expands symmetrically. Lungs are clear to auscultation, no crackles or wheezes. Increased expiratory sounds Heart - Regular rate and rhythm.  No murmurs, gallops or rubs.    Mouth - no lesions, mucous membranes are moist, no decaying teeth  Extremities:  No cyanosis,r deformity noted with good range of motion of all major joints.  Trace pitting edema vs lipidedema in both lower legs ankle       Assessment & Plan:

## 2014-01-10 ENCOUNTER — Ambulatory Visit: Payer: Self-pay | Admitting: Oncology

## 2014-01-10 ENCOUNTER — Encounter: Payer: Self-pay | Admitting: Family Medicine

## 2014-01-10 ENCOUNTER — Other Ambulatory Visit: Payer: Self-pay

## 2014-01-10 DIAGNOSIS — J309 Allergic rhinitis, unspecified: Secondary | ICD-10-CM | POA: Insufficient documentation

## 2014-01-10 NOTE — Assessment & Plan Note (Signed)
Her current symptoms of congestion and mild shortness of breath without hypoxia and normal lung exam and ear effusion all seem most consistent with allergies.  Will treat with antihistamine.   Check labs for edema

## 2014-01-14 ENCOUNTER — Telehealth: Payer: Self-pay | Admitting: Internal Medicine

## 2014-01-14 ENCOUNTER — Other Ambulatory Visit: Payer: Self-pay | Admitting: Adult Health

## 2014-01-14 DIAGNOSIS — Z853 Personal history of malignant neoplasm of breast: Secondary | ICD-10-CM

## 2014-01-14 NOTE — Telephone Encounter (Signed)
Pt has come in today to request an appointment with a nurse or doctor; pt has stated that her selling has not gotten any better and that she is also experiencing some congestion/yellowing in her eyes; pt states that she was told it is allergies however she does not share the same perspective; please f/u with pt on her cell phone as her mom is in the nursing home;

## 2014-01-15 NOTE — Telephone Encounter (Signed)
I spoke to the pt and her symptoms seemed to be caused by allergies. Pt also states that she is under a ton stress beacause her mother is about to pass away. I instructed the pt to get an appointment with doctor Wall or come in as a walk in if things get worse.

## 2014-01-21 ENCOUNTER — Ambulatory Visit: Payer: Medicare Other | Attending: Internal Medicine | Admitting: *Deleted

## 2014-01-21 VITALS — BP 151/85 | HR 83 | Temp 97.9°F | Resp 16

## 2014-01-21 DIAGNOSIS — J069 Acute upper respiratory infection, unspecified: Secondary | ICD-10-CM

## 2014-01-21 MED ORDER — DOXYCYCLINE HYCLATE 100 MG PO TABS: 100.0000 mg | ORAL_TABLET | Freq: Two times a day (BID) | ORAL | Status: DC

## 2014-01-21 NOTE — Patient Instructions (Signed)
Take prescription as prescribed. Take all of the antibiotic until finished even if you do feel better. If no relief in 2 weeks please return to the clinic.  Upper Respiratory Infection, Adult An upper respiratory infection (URI) is also sometimes known as the common cold. The upper respiratory tract includes the nose, sinuses, throat, trachea, and bronchi. Bronchi are the airways leading to the lungs. Most people improve within 1 week, but symptoms can last up to 2 weeks. A residual cough may last even longer.  CAUSES Many different viruses can infect the tissues lining the upper respiratory tract. The tissues become irritated and inflamed and often become very moist. Mucus production is also common. A cold is contagious. You can easily spread the virus to others by oral contact. This includes kissing, sharing a glass, coughing, or sneezing. Touching your mouth or nose and then touching a surface, which is then touched by another person, can also spread the virus. SYMPTOMS  Symptoms typically develop 1 to 3 days after you come in contact with a cold virus. Symptoms vary from person to person. They may include:  Runny nose.  Sneezing.  Nasal congestion.  Sinus irritation.  Sore throat.  Loss of voice (laryngitis).  Cough.  Fatigue.  Muscle aches.  Loss of appetite.  Headache.  Low-grade fever. DIAGNOSIS  You might diagnose your own cold based on familiar symptoms, since most people get a cold 2 to 3 times a year. Your caregiver can confirm this based on your exam. Most importantly, your caregiver can check that your symptoms are not due to another disease such as strep throat, sinusitis, pneumonia, asthma, or epiglottitis. Blood tests, throat tests, and X-rays are not necessary to diagnose a common cold, but they may sometimes be helpful in excluding other more serious diseases. Your caregiver will decide if any further tests are required. RISKS AND COMPLICATIONS  You may be at risk  for a more severe case of the common cold if you smoke cigarettes, have chronic heart disease (such as heart failure) or lung disease (such as asthma), or if you have a weakened immune system. The very young and very old are also at risk for more serious infections. Bacterial sinusitis, middle ear infections, and bacterial pneumonia can complicate the common cold. The common cold can worsen asthma and chronic obstructive pulmonary disease (COPD). Sometimes, these complications can require emergency medical care and may be life-threatening. PREVENTION  The best way to protect against getting a cold is to practice good hygiene. Avoid oral or hand contact with people with cold symptoms. Wash your hands often if contact occurs. There is no clear evidence that vitamin C, vitamin E, echinacea, or exercise reduces the chance of developing a cold. However, it is always recommended to get plenty of rest and practice good nutrition. TREATMENT  Treatment is directed at relieving symptoms. There is no cure. Antibiotics are not effective, because the infection is caused by a virus, not by bacteria. Treatment may include:  Increased fluid intake. Sports drinks offer valuable electrolytes, sugars, and fluids.  Breathing heated mist or steam (vaporizer or shower).  Eating chicken soup or other clear broths, and maintaining good nutrition.  Getting plenty of rest.  Using gargles or lozenges for comfort.  Controlling fevers with ibuprofen or acetaminophen as directed by your caregiver.  Increasing usage of your inhaler if you have asthma. Zinc gel and zinc lozenges, taken in the first 24 hours of the common cold, can shorten the duration and lessen the  severity of symptoms. Pain medicines may help with fever, muscle aches, and throat pain. A variety of non-prescription medicines are available to treat congestion and runny nose. Your caregiver can make recommendations and may suggest nasal or lung inhalers for other  symptoms.  HOME CARE INSTRUCTIONS   Only take over-the-counter or prescription medicines for pain, discomfort, or fever as directed by your caregiver.  Use a warm mist humidifier or inhale steam from a shower to increase air moisture. This may keep secretions moist and make it easier to breathe.  Drink enough water and fluids to keep your urine clear or pale yellow.  Rest as needed.  Return to work when your temperature has returned to normal or as your caregiver advises. You may need to stay home longer to avoid infecting others. You can also use a face mask and careful hand washing to prevent spread of the virus. SEEK MEDICAL CARE IF:   After the first few days, you feel you are getting worse rather than better.  You need your caregiver's advice about medicines to control symptoms.  You develop chills, worsening shortness of breath, or brown or red sputum. These may be signs of pneumonia.  You develop yellow or brown nasal discharge or pain in the face, especially when you bend forward. These may be signs of sinusitis.  You develop a fever, swollen neck glands, pain with swallowing, or white areas in the back of your throat. These may be signs of strep throat. SEEK IMMEDIATE MEDICAL CARE IF:   You have a fever.  You develop severe or persistent headache, ear pain, sinus pain, or chest pain.  You develop wheezing, a prolonged cough, cough up blood, or have a change in your usual mucus (if you have chronic lung disease).  You develop sore muscles or a stiff neck. Document Released: 02/22/2001 Document Revised: 11/21/2011 Document Reviewed: 12/31/2010 [facility] Patient Information 2014 [place], [place].

## 2014-01-21 NOTE — Progress Notes (Unsigned)
Patient here today complaining of coughing up yellow sputum as well as yellow drainage from nose. Lungs are clear on auscultation. Patient prescribed Doxycyline 100 mg twice a day for seven days.

## 2014-01-29 DIAGNOSIS — M5430 Sciatica, unspecified side: Secondary | ICD-10-CM | POA: Diagnosis not present

## 2014-01-29 DIAGNOSIS — G894 Chronic pain syndrome: Secondary | ICD-10-CM | POA: Diagnosis not present

## 2014-01-29 DIAGNOSIS — M546 Pain in thoracic spine: Secondary | ICD-10-CM | POA: Diagnosis not present

## 2014-01-29 DIAGNOSIS — IMO0002 Reserved for concepts with insufficient information to code with codable children: Secondary | ICD-10-CM | POA: Diagnosis not present

## 2014-02-12 ENCOUNTER — Other Ambulatory Visit: Payer: Self-pay

## 2014-02-12 ENCOUNTER — Other Ambulatory Visit: Payer: Self-pay | Admitting: Adult Health

## 2014-02-12 DIAGNOSIS — Z853 Personal history of malignant neoplasm of breast: Secondary | ICD-10-CM

## 2014-02-18 ENCOUNTER — Telehealth: Payer: Self-pay

## 2014-02-18 ENCOUNTER — Ambulatory Visit
Admission: RE | Admit: 2014-02-18 | Discharge: 2014-02-18 | Disposition: A | Discharge status: Home / Self Care | Payer: Medicaid Other | Source: Ambulatory Visit | Attending: Adult Health | Admitting: Adult Health

## 2014-02-18 DIAGNOSIS — R922 Inconclusive mammogram: Secondary | ICD-10-CM | POA: Diagnosis not present

## 2014-02-18 DIAGNOSIS — Z853 Personal history of malignant neoplasm of breast: Secondary | ICD-10-CM

## 2014-02-18 NOTE — Telephone Encounter (Signed)
Returned pt call - neighbor informed her Gilliam had left practice.  Let pt know she will be re-scheduled.  Pt indicates she wants to wait until August and see Gudena.  Let pt know I would pass on msg to scheduling.  Pt voiced understanding.

## 2014-03-04 ENCOUNTER — Telehealth: Payer: Self-pay | Admitting: Hematology and Oncology

## 2014-03-14 ENCOUNTER — Other Ambulatory Visit: Payer: Self-pay

## 2014-03-14 ENCOUNTER — Ambulatory Visit: Payer: Self-pay | Admitting: Oncology

## 2014-03-24 ENCOUNTER — Encounter: Payer: Self-pay | Admitting: Internal Medicine

## 2014-03-24 ENCOUNTER — Ambulatory Visit: Payer: Medicare Other | Attending: Internal Medicine | Admitting: Internal Medicine

## 2014-03-24 VITALS — BP 128/87 | HR 68 | Temp 98.5°F | Resp 16 | Ht 63.0 in | Wt 177.0 lb

## 2014-03-24 DIAGNOSIS — J45909 Unspecified asthma, uncomplicated: Secondary | ICD-10-CM | POA: Insufficient documentation

## 2014-03-24 DIAGNOSIS — K219 Gastro-esophageal reflux disease without esophagitis: Secondary | ICD-10-CM

## 2014-03-24 DIAGNOSIS — E785 Hyperlipidemia, unspecified: Secondary | ICD-10-CM

## 2014-03-24 DIAGNOSIS — Z881 Allergy status to other antibiotic agents status: Secondary | ICD-10-CM | POA: Diagnosis not present

## 2014-03-24 DIAGNOSIS — Z880 Allergy status to penicillin: Secondary | ICD-10-CM | POA: Insufficient documentation

## 2014-03-24 DIAGNOSIS — F411 Generalized anxiety disorder: Secondary | ICD-10-CM | POA: Diagnosis not present

## 2014-03-24 DIAGNOSIS — I2510 Atherosclerotic heart disease of native coronary artery without angina pectoris: Secondary | ICD-10-CM | POA: Diagnosis not present

## 2014-03-24 DIAGNOSIS — Z7901 Long term (current) use of anticoagulants: Secondary | ICD-10-CM | POA: Insufficient documentation

## 2014-03-24 DIAGNOSIS — Z91040 Latex allergy status: Secondary | ICD-10-CM | POA: Diagnosis not present

## 2014-03-24 DIAGNOSIS — Z886 Allergy status to analgesic agent status: Secondary | ICD-10-CM | POA: Insufficient documentation

## 2014-03-24 DIAGNOSIS — I10 Essential (primary) hypertension: Secondary | ICD-10-CM

## 2014-03-24 DIAGNOSIS — Z5189 Encounter for other specified aftercare: Secondary | ICD-10-CM

## 2014-03-24 DIAGNOSIS — T7840XD Allergy, unspecified, subsequent encounter: Secondary | ICD-10-CM

## 2014-03-24 MED ORDER — PANTOPRAZOLE SODIUM 40 MG PO TBEC: 40.0000 mg | DELAYED_RELEASE_TABLET | Freq: Every day | ORAL | Status: DC

## 2014-03-24 MED ORDER — METOPROLOL TARTRATE 50 MG PO TABS: 50.0000 mg | ORAL_TABLET | Freq: Every day | ORAL | Status: DC

## 2014-03-24 MED ORDER — CETIRIZINE HCL 10 MG PO CAPS: 1.0000 | ORAL_CAPSULE | Freq: Every day | ORAL | Status: DC

## 2014-03-24 MED ORDER — ALPRAZOLAM 1 MG PO TABS: 0.5000 mg | ORAL_TABLET | Freq: Two times a day (BID) | ORAL | Status: DC | PRN

## 2014-03-24 MED ORDER — PRAVASTATIN SODIUM 40 MG PO TABS: 40.0000 mg | ORAL_TABLET | Freq: Every evening | ORAL | Status: DC

## 2014-03-24 MED ORDER — FLUTICASONE-SALMETEROL 250-50 MCG/DOSE IN AEPB: 1.0000 | INHALATION_SPRAY | Freq: Two times a day (BID) | RESPIRATORY_TRACT | Status: DC

## 2014-03-24 NOTE — Progress Notes (Signed)
Patient ID: [name], female   DOB: 12-25-1950, 63 y.o.   MRN: [id number]   [name], is a 63 y.o. female  TGY:[id number]  KAJ:[id number]  DOB - [birth year]  Chief Complaint  Patient presents with  . Follow-up        Subjective:   [name] is a 63 y.o. female here today for a follow up visit. Patient has extensive medical history for hypertension, coronary artery disease, irritable bowel syndrome, asthma, dyslipidemia, and GERD. She is here today following up on her hypertension. She claims compliant with medications, her blood pressure today is 128/87. She has no new complaint except for ongoing swelling of her legs. She needs refill on her medications including that for anxiety and her Advair inhalers. Patient has No headache, No chest pain, No abdominal pain - No Nausea, No new weakness tingling or numbness, No Cough - SOB.  Problem  Gastroesophageal Reflux Disease Without Esophagitis    ALLERGIES: Allergies  Allergen Reactions  . Amoxicillin Swelling    Throat Swells  . Azithromycin Swelling    Throat Swelling  . Bromfed Swelling    Throat Swelling  . Cephalexin Swelling    Throat Swelling  . Chlordiazepoxide-Clidinium Swelling    Throat Swelling  . Clotrimazole Other (See Comments) and Hypertension    Patient told me that she couldn't swallow due to the medication  . Dicyclomine Hcl Hives  . Hydralazine Hcl     Rash and itching  . Ibuprofen     N/V  . Iohexol      Code: HIVES, Desc: throat swelling no hives 20 yrs ago;needs pre-medication  09/19/07 sg, Onset Date: [id number]   . Latex Swelling    Blisters on Skin  . Lidocaine Hives  . Paroxetine Swelling    Throat Swelling  . Penicillins Hives  . Propoxyphene N-Acetaminophen Swelling    REACTION: swelling in the throat  . Sertraline Hcl Swelling    Throat Swelling  . Sulfadiazine Swelling    Throat Swelling  . Verapamil Swelling    Throat Swelling    PAST MEDICAL HISTORY: Past Medical History    Diagnosis Date  . Myocardial infarct 2005  . CAD (coronary artery disease)   . Hypertension   . Long term (current) use of anticoagulants   . Edema   . Hematoma   . Sinusitis acute   . Adenocarcinoma of breast     right  . Acute upper respiratory infections of unspecified site   . Depression   . Anal fissure   . GERD (gastroesophageal reflux disease)   . Dog bite(E906.0)   . Diverticulosis of colon (without mention of hemorrhage)   . Spinal stenosis, lumbar region, without neurogenic claudication   . Anxiety   . Panic attacks   . Asthma   . Irritable bowel syndrome   . Hiatal hernia   . Jaundice     Hx of Jaundice at age 73 from "dirty restuarant". Unsure of Hepatitis type    MEDICATIONS AT HOME: Prior to Admission medications   Medication Sig Start Date End Date Taking? Authorizing Provider  acetaminophen (TYLENOL) 500 MG tablet Take 500 mg by mouth every 8 (eight) hours as needed (pain).    Yes Historical Provider, MD  ALPRAZolam Duanne Moron) 1 MG tablet Take 0.5-1 tablets (0.5-1 mg total) by mouth 2 (two) times daily as needed for anxiety or sleep. 03/24/14  Yes Angelica Chessman, MD  ascorbic acid (VITAMIN C) 1000 MG tablet Take 1,000 mg by mouth  daily.   Yes Historical Provider, MD  aspirin 81 MG tablet Take 81 mg by mouth daily.     Yes Historical Provider, MD  Calcium Carbonate-Vitamin D (CALCIUM-CARB 600 + D) 600-125 MG-UNIT TABS Take 1 capsule by mouth daily.    Yes Historical Provider, MD  cycloSPORINE (RESTASIS) 0.05 % ophthalmic emulsion Place 1 drop into both eyes 2 (two) times daily.     Yes Historical Provider, MD  doxycycline (VIBRA-TABS) 100 MG tablet Take 1 tablet (100 mg total) by mouth 2 (two) times daily. 01/21/14  Yes Angelica Chessman, MD  Fluticasone-Salmeterol (ADVAIR) 250-50 MCG/DOSE AEPB Inhale 1 puff into the lungs 2 (two) times daily. 03/24/14  Yes Angelica Chessman, MD  Ipratropium-Albuterol (COMBIVENT) 20-100 MCG/ACT AERS respimat Inhale 1 puff into the  lungs every 6 (six) hours as needed for wheezing or shortness of breath. 12/23/13  Yes Angelica Chessman, MD  metoprolol (LOPRESSOR) 50 MG tablet Take 1 tablet (50 mg total) by mouth daily. 03/24/14  Yes Angelica Chessman, MD  pantoprazole (PROTONIX) 40 MG tablet Take 1 tablet (40 mg total) by mouth daily. 03/24/14  Yes Angelica Chessman, MD  Probiotic Product (PROBIOTIC DAILY) CAPS Take 1 capsule by mouth daily.   Yes Historical Provider, MD  vitamin E (VITAMIN E) 400 UNIT capsule Take 400 Units by mouth daily.   Yes Historical Provider, MD  VOLTAREN 1 % GEL Apply 2 g topically 2 (two) times daily as needed (pain).  06/30/13  Yes Historical Provider, MD  Camphor-Menthol-Methyl Sal (SALONPAS) 1.2-5.7-6.3 % PTCH Apply 3 patches topically daily. Removes patches at night    Historical Provider, MD  Cetirizine HCl 10 MG CAPS Take 1 capsule (10 mg total) by mouth daily. 03/24/14   Angelica Chessman, MD  gabapentin (NEURONTIN) 300 MG capsule Take 1 capsule (300 mg total) by mouth at bedtime. Take 1 cap at bedtime then 2 caps times 7 days then 3 caps qhs 08/19/13   Janece Canterbury, MD  hydrOXYzine (VISTARIL) 100 MG capsule Take 1 capsule (100 mg total) by mouth 3 (three) times daily as needed for itching (allergies). 11/26/13   Theodis Blaze, MD  Linaclotide (LINZESS) 290 MCG CAPS capsule Take 1 capsule (290 mcg total) by mouth daily. 08/16/13   Sable Feil, MD  pravastatin (PRAVACHOL) 40 MG tablet Take 1 tablet (40 mg total) by mouth every evening. 03/24/14   Angelica Chessman, MD  rifaximin (XIFAXAN) 550 MG TABS tablet Take 1 tablet (550 mg total) by mouth 2 (two) times daily. 08/16/13   Sable Feil, MD  sodium chloride (OCEAN) 0.65 % nasal spray Place 1 spray into the nose as needed for congestion.    Historical Provider, MD  valsartan (DIOVAN) 320 MG tablet Take in am daily 12/23/13   Angelica Chessman, MD     Objective:   Filed Vitals:   03/24/14 1107  BP: 128/87  Pulse: 68  Temp: 98.5 F  (36.9 C)  TempSrc: Oral  Resp: 16  Height: 5\' 3"  (1.6 m)  Weight: 177 lb (80.287 kg)  SpO2: 97%    Exam General appearance : Awake, alert, not in any distress. Speech Clear. Not toxic looking HEENT: Atraumatic and Normocephalic, pupils equally reactive to light and accomodation Neck: supple, no JVD. No cervical lymphadenopathy.  Chest:Good air entry bilaterally, no added sounds  CVS: S1 S2 regular, no murmurs.  Abdomen: Bowel sounds present, Non tender and not distended with no gaurding, rigidity or rebound. Extremities: B/L Lower Ext shows no edema, both legs  are warm to touch Neurology: Awake alert, and oriented X 3, CN II-XII intact, Non focal Skin:No Rash Wounds:N/A  Data Review Lab Results  Component Value Date   HGBA1C 5.8* 11/26/2013   HGBA1C 6.2 06/10/2011   HGBA1C  Value: 5.9 (NOTE)                                                                       According to the ADA Clinical Practice Recommendations for 2011, when HbA1c is used as a screening test:   >=6.5%   Diagnostic of Diabetes Mellitus           (if abnormal result  is confirmed)  5.7-6.4%   Increased risk of developing Diabetes Mellitus  References:Diagnosis and Classification of Diabetes Mellitus,Diabetes VZSM,2707,86(LJQGB 1):S62-S69 and Standards of Medical Care in         Diabetes - 2011,Diabetes EEFE,0712,19  (Suppl 1):S11-S61.* 07/16/2010     Assessment & Plan   1. Anxiety state, unspecified  - ALPRAZolam (XANAX) 1 MG tablet; Take 0.5-1 tablets (0.5-1 mg total) by mouth 2 (two) times daily as needed for anxiety or sleep.  Dispense: 60 tablet; Refill: 3  2. Essential hypertension  - metoprolol (LOPRESSOR) 50 MG tablet; Take 1 tablet (50 mg total) by mouth daily.  Dispense: 90 tablet; Refill: 3  3. Gastroesophageal reflux disease without esophagitis  - pantoprazole (PROTONIX) 40 MG tablet; Take 1 tablet (40 mg total) by mouth daily.  Dispense: 90 tablet; Refill: 3  4. HLD (hyperlipidemia)  -  pravastatin (PRAVACHOL) 40 MG tablet; Take 1 tablet (40 mg total) by mouth every evening.  Dispense: 90 tablet; Refill: 3  5. Allergy, subsequent encounter  - Cetirizine HCl 10 MG CAPS; Take 1 capsule (10 mg total) by mouth daily.  Dispense: 90 capsule; Refill: 3 - Fluticasone-Salmeterol (ADVAIR) 250-50 MCG/DOSE AEPB; Inhale 1 puff into the lungs 2 (two) times daily.  Dispense: 60 each; Refill: 3  Patient was counseled extensively about nutrition and exercise   Return in about 3 months (around 06/24/2014), or if symptoms worsen or fail to improve, for Follow up HTN, Follow up Pain and comorbidities, COPD.  The patient was given clear instructions to go to ER or return to medical center if symptoms don't improve, worsen or new problems develop. The patient verbalized understanding. The patient was told to call to get lab results if they haven't heard anything in the next week.   This note has been created with Surveyor, quantity. Any transcriptional errors are unintentional.    Angelica Chessman, MD, [place], [place], [place] and [facility] [place], [place]   03/24/2014, 11:48 AM

## 2014-03-24 NOTE — Progress Notes (Signed)
Pt is here following up on her HTN. Pt reports having swelling at her legs and feet.

## 2014-03-24 NOTE — Patient Instructions (Signed)
Hypertension Hypertension, commonly called high blood pressure, is when the force of blood pumping through your arteries is too strong. Your arteries are the blood vessels that carry blood from your heart throughout your body. A blood pressure reading consists of a higher number over a lower number, such as 110/72. The higher number (systolic) is the pressure inside your arteries when your heart pumps. The lower number (diastolic) is the pressure inside your arteries when your heart relaxes. Ideally you want your blood pressure below 120/80. Hypertension forces your heart to work harder to pump blood. Your arteries may become narrow or stiff. Having hypertension puts you at risk for heart disease, stroke, and other problems.  RISK FACTORS Some risk factors for high blood pressure are controllable. Others are not.  Risk factors you cannot control include:   Race. You may be at higher risk if you are African American.  Age. Risk increases with age.  Gender. Men are at higher risk than women before age 45 years. After age 65, women are at higher risk than men. Risk factors you can control include:  Not getting enough exercise or physical activity.  Being overweight.  Getting too much fat, sugar, calories, or salt in your diet.  Drinking too much alcohol. SIGNS AND SYMPTOMS Hypertension does not usually cause signs or symptoms. Extremely high blood pressure (hypertensive crisis) may cause headache, anxiety, shortness of breath, and nosebleed. DIAGNOSIS  To check if you have hypertension, your health care provider will measure your blood pressure while you are seated, with your arm held at the level of your heart. It should be measured at least twice using the same arm. Certain conditions can cause a difference in blood pressure between your right and left arms. A blood pressure reading that is higher than normal on one occasion does not mean that you need treatment. If one blood pressure reading  is high, ask your health care provider about having it checked again. TREATMENT  Treating high blood pressure includes making lifestyle changes and possibly taking medication. Living a healthy lifestyle can help lower high blood pressure. You may need to change some of your habits. Lifestyle changes may include:  Following the DASH diet. This diet is high in fruits, vegetables, and whole grains. It is low in salt, red meat, and added sugars.  Getting at least 2 1/2 hours of brisk physical activity every week.  Losing weight if necessary.  Not smoking.  Limiting alcoholic beverages.  Learning ways to reduce stress. If lifestyle changes are not enough to get your blood pressure under control, your health care provider may prescribe medicine. You may need to take more than one. Work closely with your health care provider to understand the risks and benefits. HOME CARE INSTRUCTIONS  Have your blood pressure rechecked as directed by your health care provider.   Only take medicine as directed by your health care provider. Follow the directions carefully. Blood pressure medicines must be taken as prescribed. The medicine does not work as well when you skip doses. Skipping doses also puts you at risk for problems.   Do not smoke.   Monitor your blood pressure at home as directed by your health care provider. SEEK MEDICAL CARE IF:   You think you are having a reaction to medicines taken.  You have recurrent headaches or feel dizzy.  You have swelling in your ankles.  You have trouble with your vision. SEEK IMMEDIATE MEDICAL CARE IF:  You develop a severe headache or   confusion.  You have unusual weakness, numbness, or feel faint.  You have severe chest or abdominal pain.  You vomit repeatedly.  You have trouble breathing. MAKE SURE YOU:   Understand these instructions.  Will watch your condition.  Will get help right away if you are not doing well or get  worse. Document Released: 08/29/2005 Document Revised: 09/03/2013 Document Reviewed: 06/21/2013 ExitCare Patient Information 2015 [employer]. This information is not intended to replace advice given to you by your health care provider. Make sure you discuss any questions you have with your health care provider.  

## 2014-04-01 DIAGNOSIS — Z5181 Encounter for therapeutic drug level monitoring: Secondary | ICD-10-CM | POA: Diagnosis not present

## 2014-04-01 DIAGNOSIS — M5430 Sciatica, unspecified side: Secondary | ICD-10-CM | POA: Diagnosis not present

## 2014-04-01 DIAGNOSIS — G894 Chronic pain syndrome: Secondary | ICD-10-CM | POA: Diagnosis not present

## 2014-04-01 DIAGNOSIS — Z79899 Other long term (current) drug therapy: Secondary | ICD-10-CM | POA: Diagnosis not present

## 2014-04-01 DIAGNOSIS — M47817 Spondylosis without myelopathy or radiculopathy, lumbosacral region: Secondary | ICD-10-CM | POA: Diagnosis not present

## 2014-04-01 DIAGNOSIS — M5137 Other intervertebral disc degeneration, lumbosacral region: Secondary | ICD-10-CM | POA: Diagnosis not present

## 2014-04-25 ENCOUNTER — Emergency Department (INDEPENDENT_AMBULATORY_CARE_PROVIDER_SITE_OTHER): Payer: Medicare Other

## 2014-04-25 ENCOUNTER — Emergency Department (INDEPENDENT_AMBULATORY_CARE_PROVIDER_SITE_OTHER)
Admission: EM | Admit: 2014-04-25 | Discharge: 2014-04-25 | Disposition: A | Discharge status: Home / Self Care | Payer: Medicare Other | Source: Home / Self Care | Attending: Family Medicine | Admitting: Family Medicine

## 2014-04-25 ENCOUNTER — Encounter (HOSPITAL_COMMUNITY): Payer: Self-pay | Admitting: Emergency Medicine

## 2014-04-25 DIAGNOSIS — J42 Unspecified chronic bronchitis: Secondary | ICD-10-CM

## 2014-04-25 DIAGNOSIS — Z853 Personal history of malignant neoplasm of breast: Secondary | ICD-10-CM | POA: Diagnosis not present

## 2014-04-25 DIAGNOSIS — J209 Acute bronchitis, unspecified: Secondary | ICD-10-CM | POA: Diagnosis not present

## 2014-04-25 LAB — POCT URINALYSIS DIP (DEVICE)
Bilirubin Urine: NEGATIVE
Glucose, UA: NEGATIVE mg/dL
KETONES UR: NEGATIVE mg/dL
Nitrite: NEGATIVE
PROTEIN: NEGATIVE mg/dL
SPECIFIC GRAVITY, URINE: 1.01 (ref 1.005–1.030)
UROBILINOGEN UA: 0.2 mg/dL (ref 0.0–1.0)
pH: 7 (ref 5.0–8.0)

## 2014-04-25 MED ORDER — DEXTROMETHORPHAN POLISTIREX 30 MG/5ML PO LQCR: 60.0000 mg | Freq: Two times a day (BID) | ORAL | Status: DC

## 2014-04-25 MED ORDER — MOXIFLOXACIN HCL 400 MG PO TABS: 400.0000 mg | ORAL_TABLET | Freq: Every day | ORAL | Status: DC

## 2014-04-25 NOTE — ED Notes (Signed)
Pt  Reports       Back  Pain   X  5  Days  denys  Any  specefic  Injury         Also reports  Symptoms  Of  A  Cough               Pt  Ambulated  To  Room  With a  Steady  Fluid  Gait

## 2014-04-25 NOTE — ED Provider Notes (Signed)
CSN: [id number]     Arrival date & time 04/25/14  1552 History   First MD Initiated Contact with Patient 04/25/14 [phone]     Chief Complaint  Patient presents with  . Back Pain   (Consider location/radiation/quality/duration/timing/severity/associated sxs/prior Treatment) Patient is a 63 y.o. female presenting with back pain. The history is provided by the patient.  Back Pain Location:  Thoracic spine Quality:  Stabbing Radiates to:  Does not radiate Pain severity:  Mild Onset quality:  Gradual Duration:  5 days Progression:  Unchanged Chronicity:  New Context: not lifting heavy objects   Context comment:  Coughing Associated symptoms: chest pain and fever   Associated symptoms: no abdominal pain, no dysuria and no leg pain     Past Medical History  Diagnosis Date  . Myocardial infarct 2005  . CAD (coronary artery disease)   . Hypertension   . Long term (current) use of anticoagulants   . Edema   . Hematoma   . Sinusitis acute   . Adenocarcinoma of breast     right  . Acute upper respiratory infections of unspecified site   . Depression   . Anal fissure   . GERD (gastroesophageal reflux disease)   . Dog bite(E906.0)   . Diverticulosis of colon (without mention of hemorrhage)   . Spinal stenosis, lumbar region, without neurogenic claudication   . Anxiety   . Panic attacks   . Asthma   . Irritable bowel syndrome   . Hiatal hernia   . Jaundice     Hx of Jaundice at age 55 from "dirty restuarant". Unsure of Hepatitis type   Past Surgical History  Procedure Laterality Date  . Partial hysterectomy  1987  . Cardiac catheterization  2001  . Coronary angioplasty with stent placement  2001  . Cholecystectomy    . Bladder repair      tact  . Breast lumpectomy Right   . Breast reconstruction Right   . Breast reduction surgery Left   . Colonoscopy  2013    Diverticulosis  . Cataract extraction    . Esophageal manometry  10/08/2012    Procedure: ESOPHAGEAL MANOMETRY  (EM);  Surgeon: Sable Feil, MD;  Location: [facility];  Service: Endoscopy;  Laterality: N/A;  . Esophagogastroduodenoscopy  2014    Normal    Family History  Problem Relation Age of Onset  . Hypertension Mother   . Heart disease Mother   . Dementia Mother   . Arthritis Mother   . Diabetes Mother   . Prostate cancer Father   . Lung cancer Maternal Uncle   . Hypertension Father   . Hypertension Sister   . Hypertension Brother   . Heart disease Sister   . Rectal cancer Neg Hx   . Stomach cancer Neg Hx   . Colon cancer Cousin   . Colon polyps Mother   . Colon polyps Father   . Inflammatory bowel disease Sister    History  Substance Use Topics  . Smoking status: Former Smoker -- 0.30 packs/day for 8 years    Types: Cigarettes    Quit date: 09/13/1983  . Smokeless tobacco: Never Used  . Alcohol Use: No   OB History   Grav Para Term Preterm Abortions TAB SAB Ect Mult Living                 Review of Systems  Constitutional: Positive for fever.  HENT: Negative.   Respiratory: Positive for cough. Negative for shortness of  breath.   Cardiovascular: Positive for chest pain. Negative for leg swelling.  Gastrointestinal: Negative.  Negative for abdominal pain.  Genitourinary: Negative.  Negative for dysuria.  Musculoskeletal: Positive for back pain.    Allergies  Amoxicillin; Azithromycin; Bromfed; Cephalexin; Chlordiazepoxide-clidinium; Clotrimazole; Dicyclomine hcl; Hydralazine hcl; Ibuprofen; Iohexol; Latex; Lidocaine; Paroxetine; Penicillins; Propoxyphene n-acetaminophen; Sertraline hcl; Sulfadiazine; and Verapamil  Home Medications   Prior to Admission medications   Medication Sig Start Date End Date Taking? Authorizing Provider  acetaminophen (TYLENOL) 500 MG tablet Take 500 mg by mouth every 8 (eight) hours as needed (pain).     Historical Provider, MD  ALPRAZolam Duanne Moron) 1 MG tablet Take 0.5-1 tablets (0.5-1 mg total) by mouth 2 (two) times daily as needed  for anxiety or sleep. 03/24/14   Angelica Chessman, MD  ascorbic acid (VITAMIN C) 1000 MG tablet Take 1,000 mg by mouth daily.    Historical Provider, MD  aspirin 81 MG tablet Take 81 mg by mouth daily.      Historical Provider, MD  Calcium Carbonate-Vitamin D (CALCIUM-CARB 600 + D) 600-125 MG-UNIT TABS Take 1 capsule by mouth daily.     Historical Provider, MD  Camphor-Menthol-Methyl Sal (SALONPAS) 1.2-5.7-6.3 % PTCH Apply 3 patches topically daily. Removes patches at night    Historical Provider, MD  Cetirizine HCl 10 MG CAPS Take 1 capsule (10 mg total) by mouth daily. 03/24/14   Angelica Chessman, MD  cycloSPORINE (RESTASIS) 0.05 % ophthalmic emulsion Place 1 drop into both eyes 2 (two) times daily.      Historical Provider, MD  dextromethorphan (DELSYM) 30 MG/5ML liquid Take 10 mLs (60 mg total) by mouth 2 (two) times daily. For cough 04/25/14   Billy Fischer, MD  doxycycline (VIBRA-TABS) 100 MG tablet Take 1 tablet (100 mg total) by mouth 2 (two) times daily. 01/21/14   Angelica Chessman, MD  Fluticasone-Salmeterol (ADVAIR) 250-50 MCG/DOSE AEPB Inhale 1 puff into the lungs 2 (two) times daily. 03/24/14   Angelica Chessman, MD  gabapentin (NEURONTIN) 300 MG capsule Take 1 capsule (300 mg total) by mouth at bedtime. Take 1 cap at bedtime then 2 caps times 7 days then 3 caps qhs 08/19/13   Janece Canterbury, MD  hydrOXYzine (VISTARIL) 100 MG capsule Take 1 capsule (100 mg total) by mouth 3 (three) times daily as needed for itching (allergies). 11/26/13   Theodis Blaze, MD  Ipratropium-Albuterol (COMBIVENT) 20-100 MCG/ACT AERS respimat Inhale 1 puff into the lungs every 6 (six) hours as needed for wheezing or shortness of breath. 12/23/13   Angelica Chessman, MD  Linaclotide (LINZESS) 290 MCG CAPS capsule Take 1 capsule (290 mcg total) by mouth daily. 08/16/13   Sable Feil, MD  metoprolol (LOPRESSOR) 50 MG tablet Take 1 tablet (50 mg total) by mouth daily. 03/24/14   Angelica Chessman, MD  moxifloxacin  (AVELOX) 400 MG tablet Take 1 tablet (400 mg total) by mouth daily. One tab daily 04/25/14   Billy Fischer, MD  pantoprazole (PROTONIX) 40 MG tablet Take 1 tablet (40 mg total) by mouth daily. 03/24/14   Angelica Chessman, MD  pravastatin (PRAVACHOL) 40 MG tablet Take 1 tablet (40 mg total) by mouth every evening. 03/24/14   Angelica Chessman, MD  Probiotic Product (PROBIOTIC DAILY) CAPS Take 1 capsule by mouth daily.    Historical Provider, MD  rifaximin (XIFAXAN) 550 MG TABS tablet Take 1 tablet (550 mg total) by mouth 2 (two) times daily. 08/16/13   Sable Feil, MD  sodium chloride Wilber Bihari)  0.65 % nasal spray Place 1 spray into the nose as needed for congestion.    Historical Provider, MD  valsartan (DIOVAN) 320 MG tablet Take in am daily 12/23/13   Angelica Chessman, MD  vitamin E (VITAMIN E) 400 UNIT capsule Take 400 Units by mouth daily.    Historical Provider, MD  VOLTAREN 1 % GEL Apply 2 g topically 2 (two) times daily as needed (pain).  06/30/13   Historical Provider, MD   BP 186/104  Pulse 72  Temp(Src) 98.8 F (37.1 C) (Oral)  Resp 18  SpO2 98% Physical Exam  Nursing note and vitals reviewed. Constitutional: She is oriented to person, place, and time. She appears well-developed and well-nourished.  HENT:  Right Ear: External ear normal.  Left Ear: External ear normal.  Mouth/Throat: Oropharynx is clear and moist.  Eyes: Pupils are equal, round, and reactive to light.  Neck: Normal range of motion. Neck supple.  Cardiovascular: Normal heart sounds.   Pulmonary/Chest: Effort normal and breath sounds normal.  Abdominal: Soft. Bowel sounds are normal.  Musculoskeletal:  Left mid back , cva area pain .  Lymphadenopathy:    She has no cervical adenopathy.  Neurological: She is alert and oriented to person, place, and time.  Skin: Skin is warm and dry.    ED Course  Procedures (including critical care time) Labs Review Labs Reviewed  POCT URINALYSIS DIP (DEVICE) -  Abnormal; Notable for the following:    Hgb urine dipstick TRACE (*)    Leukocytes, UA SMALL (*)    All other components within normal limits    Imaging Review Dg Chest 2 View  04/25/2014   CLINICAL DATA:  Back pain for 5 days, cough since yesterday, history coronary artery disease post MI, hypertension, asthma, breast cancer  EXAM: CHEST  2 VIEW  COMPARISON:  08/18/2013  FINDINGS: Normal heart size, mediastinal contours, and pulmonary vascularity.  Chronic peribronchial thickening.  Lungs clear.  No pleural effusion or pneumothorax.  No acute osseous findings.  IMPRESSION: Chronic bronchitic changes.  No acute abnormalities.   Electronically Signed   By: Lavonia Dana M.D.   On: 04/25/2014 10:28   X-rays reviewed and report per radiologist.   MDM   1. Chronic bronchitis with acute exacerbation        Billy Fischer, MD 04/25/14 [phone]

## 2014-05-12 ENCOUNTER — Encounter: Payer: Self-pay | Admitting: Internal Medicine

## 2014-05-12 ENCOUNTER — Ambulatory Visit: Payer: Medicare Other | Attending: Internal Medicine | Admitting: Internal Medicine

## 2014-05-12 VITALS — BP 154/94 | HR 75 | Temp 98.4°F | Resp 18 | Ht 63.0 in | Wt 175.0 lb

## 2014-05-12 DIAGNOSIS — I2510 Atherosclerotic heart disease of native coronary artery without angina pectoris: Secondary | ICD-10-CM | POA: Diagnosis not present

## 2014-05-12 DIAGNOSIS — F411 Generalized anxiety disorder: Secondary | ICD-10-CM | POA: Insufficient documentation

## 2014-05-12 DIAGNOSIS — I10 Essential (primary) hypertension: Secondary | ICD-10-CM

## 2014-05-12 DIAGNOSIS — Z5189 Encounter for other specified aftercare: Secondary | ICD-10-CM

## 2014-05-12 DIAGNOSIS — Z792 Long term (current) use of antibiotics: Secondary | ICD-10-CM | POA: Insufficient documentation

## 2014-05-12 DIAGNOSIS — T7840XA Allergy, unspecified, initial encounter: Secondary | ICD-10-CM | POA: Insufficient documentation

## 2014-05-12 DIAGNOSIS — J309 Allergic rhinitis, unspecified: Secondary | ICD-10-CM | POA: Diagnosis not present

## 2014-05-12 DIAGNOSIS — K589 Irritable bowel syndrome without diarrhea: Secondary | ICD-10-CM | POA: Diagnosis not present

## 2014-05-12 DIAGNOSIS — Z79899 Other long term (current) drug therapy: Secondary | ICD-10-CM | POA: Diagnosis not present

## 2014-05-12 DIAGNOSIS — I252 Old myocardial infarction: Secondary | ICD-10-CM | POA: Insufficient documentation

## 2014-05-12 DIAGNOSIS — E785 Hyperlipidemia, unspecified: Secondary | ICD-10-CM

## 2014-05-12 DIAGNOSIS — K219 Gastro-esophageal reflux disease without esophagitis: Secondary | ICD-10-CM | POA: Insufficient documentation

## 2014-05-12 DIAGNOSIS — Z124 Encounter for screening for malignant neoplasm of cervix: Secondary | ICD-10-CM | POA: Diagnosis not present

## 2014-05-12 DIAGNOSIS — Z7982 Long term (current) use of aspirin: Secondary | ICD-10-CM | POA: Insufficient documentation

## 2014-05-12 DIAGNOSIS — T7840XD Allergy, unspecified, subsequent encounter: Secondary | ICD-10-CM

## 2014-05-12 LAB — LIPID PANEL
CHOL/HDL RATIO: 3.5 ratio
Cholesterol: 158 mg/dL (ref 0–200)
HDL: 45 mg/dL (ref >39)
LDL CALC: 72 mg/dL (ref 0–99)
Triglycerides: 203 mg/dL — ABNORMAL HIGH (ref <150)
VLDL: 41 mg/dL — ABNORMAL HIGH (ref 0–40)

## 2014-05-12 MED ORDER — FLUTICASONE PROPIONATE 50 MCG/ACT NA SUSP: 2.0000 | Freq: Every day | NASAL | Status: DC

## 2014-05-12 NOTE — Progress Notes (Signed)
Pt comes in as follow up HTN with medication management Pt is compliant with taking prescribed meds daily. Denies refills today Requesting GYN referral per preference for pap smear Up to date with health maintenance- Colonoscopy screening,Mammogram C/o left lower back discomfort

## 2014-05-12 NOTE — Patient Instructions (Signed)
DASH Eating Plan DASH stands for "Dietary Approaches to Stop Hypertension." The DASH eating plan is a healthy eating plan that has been shown to reduce high blood pressure (hypertension). Additional health benefits may include reducing the risk of type 2 diabetes mellitus, heart disease, and stroke. The DASH eating plan may also help with weight loss. WHAT DO I NEED TO KNOW ABOUT THE DASH EATING PLAN? For the DASH eating plan, you will follow these general guidelines:  Choose foods with a percent daily value for sodium of less than 5% (as listed on the food label).  Use salt-free seasonings or herbs instead of table salt or sea salt.  Check with your health care provider or pharmacist before using salt substitutes.  Eat lower-sodium products, often labeled as "lower sodium" or "no salt added."  Eat fresh foods.  Eat more vegetables, fruits, and low-fat dairy products.  Choose whole grains. Look for the word "whole" as the first word in the ingredient list.  Choose fish and skinless chicken or turkey more often than red meat. Limit fish, poultry, and meat to 6 oz (170 g) each day.  Limit sweets, desserts, sugars, and sugary drinks.  Choose heart-healthy fats.  Limit cheese to 1 oz (28 g) per day.  Eat more home-cooked food and less restaurant, buffet, and fast food.  Limit fried foods.  Vitullo foods using methods other than frying.  Limit canned vegetables. If you do use them, rinse them well to decrease the sodium.  When eating at a restaurant, ask that your food be prepared with less salt, or no salt if possible. WHAT FOODS CAN I EAT? Seek help from a dietitian for individual calorie needs. Grains Whole grain or whole wheat bread. Brown rice. Whole grain or whole wheat pasta. Quinoa, bulgur, and whole grain cereals. Low-sodium cereals. Corn or whole wheat flour tortillas. Whole grain cornbread. Whole grain crackers. Low-sodium crackers. Vegetables Fresh or frozen vegetables  (raw, steamed, roasted, or grilled). Low-sodium or reduced-sodium tomato and vegetable juices. Low-sodium or reduced-sodium tomato sauce and paste. Low-sodium or reduced-sodium canned vegetables.  Fruits All fresh, canned (in natural juice), or frozen fruits. Meat and Other Protein Products Ground beef (85% or leaner), grass-fed beef, or beef trimmed of fat. Skinless chicken or turkey. Ground chicken or turkey. Pork trimmed of fat. All fish and seafood. Eggs. Dried beans, peas, or lentils. Unsalted nuts and seeds. Unsalted canned beans. Dairy Low-fat dairy products, such as skim or 1% milk, 2% or reduced-fat cheeses, low-fat ricotta or cottage cheese, or plain low-fat yogurt. Low-sodium or reduced-sodium cheeses. Fats and Oils Tub margarines without trans fats. Light or reduced-fat mayonnaise and salad dressings (reduced sodium). Avocado. Safflower, olive, or canola oils. Natural peanut or almond butter. Other Unsalted popcorn and pretzels. The items listed above may not be a complete list of recommended foods or beverages. Contact your dietitian for more options. WHAT FOODS ARE NOT RECOMMENDED? Grains White bread. White pasta. White rice. Refined cornbread. Bagels and croissants. Crackers that contain trans fat. Vegetables Creamed or fried vegetables. Vegetables in a cheese sauce. Regular canned vegetables. Regular canned tomato sauce and paste. Regular tomato and vegetable juices. Fruits Dried fruits. Canned fruit in light or heavy syrup. Fruit juice. Meat and Other Protein Products Fatty cuts of meat. Ribs, chicken wings, bacon, sausage, bologna, salami, chitterlings, fatback, hot dogs, bratwurst, and packaged luncheon meats. Salted nuts and seeds. Canned beans with salt. Dairy Whole or 2% milk, cream, half-and-half, and cream cheese. Whole-fat or sweetened yogurt. Full-fat   cheeses or blue cheese. Nondairy creamers and whipped toppings. Processed cheese, cheese spreads, or cheese  curds. Condiments Onion and garlic salt, seasoned salt, table salt, and sea salt. Canned and packaged gravies. Worcestershire sauce. Tartar sauce. Barbecue sauce. Teriyaki sauce. Soy sauce, including reduced sodium. Steak sauce. Fish sauce. Oyster sauce. Cocktail sauce. Horseradish. Ketchup and mustard. Meat flavorings and tenderizers. Bouillon cubes. Hot sauce. Tabasco sauce. Marinades. Taco seasonings. Relishes. Fats and Oils Butter, stick margarine, lard, shortening, ghee, and bacon fat. Coconut, palm kernel, or palm oils. Regular salad dressings. Other Pickles and olives. Salted popcorn and pretzels. The items listed above may not be a complete list of foods and beverages to avoid. Contact your dietitian for more information. WHERE CAN I FIND MORE INFORMATION? National Heart, Lung, and Blood Institute: www.nhlbi.nih.gov/health/health-topics/topics/dash/ Document Released: 08/18/2011 Document Revised: 01/13/2014 Document Reviewed: 07/03/2013 ExitCare Patient Information 2015 [employer]. This information is not intended to replace advice given to you by your health care provider. Make sure you discuss any questions you have with your health care provider. Hypertension Hypertension, commonly called high blood pressure, is when the force of blood pumping through your arteries is too strong. Your arteries are the blood vessels that carry blood from your heart throughout your body. A blood pressure reading consists of a higher number over a lower number, such as 110/72. The higher number (systolic) is the pressure inside your arteries when your heart pumps. The lower number (diastolic) is the pressure inside your arteries when your heart relaxes. Ideally you want your blood pressure below 120/80. Hypertension forces your heart to work harder to pump blood. Your arteries may become narrow or stiff. Having hypertension puts you at risk for heart disease, stroke, and other problems.  RISK  FACTORS Some risk factors for high blood pressure are controllable. Others are not.  Risk factors you cannot control include:   Race. You may be at higher risk if you are African American.  Age. Risk increases with age.  Gender. Men are at higher risk than women before age 45 years. After age 65, women are at higher risk than men. Risk factors you can control include:  Not getting enough exercise or physical activity.  Being overweight.  Getting too much fat, sugar, calories, or salt in your diet.  Drinking too much alcohol. SIGNS AND SYMPTOMS Hypertension does not usually cause signs or symptoms. Extremely high blood pressure (hypertensive crisis) may cause headache, anxiety, shortness of breath, and nosebleed. DIAGNOSIS  To check if you have hypertension, your health care provider will measure your blood pressure while you are seated, with your arm held at the level of your heart. It should be measured at least twice using the same arm. Certain conditions can cause a difference in blood pressure between your right and left arms. A blood pressure reading that is higher than normal on one occasion does not mean that you need treatment. If one blood pressure reading is high, ask your health care provider about having it checked again. TREATMENT  Treating high blood pressure includes making lifestyle changes and possibly taking medicine. Living a healthy lifestyle can help lower high blood pressure. You may need to change some of your habits. Lifestyle changes may include:  Following the DASH diet. This diet is high in fruits, vegetables, and whole grains. It is low in salt, red meat, and added sugars.  Getting at least 2 hours of brisk physical activity every week.  Losing weight if necessary.  Not smoking.  Limiting   alcoholic beverages.  Learning ways to reduce stress. If lifestyle changes are not enough to get your blood pressure under control, your health care provider may  prescribe medicine. You may need to take more than one. Work closely with your health care provider to understand the risks and benefits. HOME CARE INSTRUCTIONS  Have your blood pressure rechecked as directed by your health care provider.   Take medicines only as directed by your health care provider. Follow the directions carefully. Blood pressure medicines must be taken as prescribed. The medicine does not work as well when you skip doses. Skipping doses also puts you at risk for problems.   Do not smoke.   Monitor your blood pressure at home as directed by your health care provider. SEEK MEDICAL CARE IF:   You think you are having a reaction to medicines taken.  You have recurrent headaches or feel dizzy.  You have swelling in your ankles.  You have trouble with your vision. SEEK IMMEDIATE MEDICAL CARE IF:  You develop a severe headache or confusion.  You have unusual weakness, numbness, or feel faint.  You have severe chest or abdominal pain.  You vomit repeatedly.  You have trouble breathing. MAKE SURE YOU:   Understand these instructions.  Will watch your condition.  Will get help right away if you are not doing well or get worse. Document Released: 08/29/2005 Document Revised: 01/13/2014 Document Reviewed: 06/21/2013 ExitCare Patient Information 2015 [employer]. This information is not intended to replace advice given to you by your health care provider. Make sure you discuss any questions you have with your health care provider.  

## 2014-05-12 NOTE — Progress Notes (Signed)
Patient ID: [name], female   DOB: 1951-07-01, 63 y.o.   MRN: [id number]   [name], is a 63 y.o. female  [id number]:[id number]  [id number]  DOB - 1950/12/07  Chief Complaint  Patient presents with  . Follow-up  . Hypertension  . Back Pain        Subjective:   [name] is a 63 y.o. female here today for a follow up visit. Patient has extensive medical history as listed came in today for follow-up of hypertension and medical management. Patient is compliant with taking prescribed medication, does not need any refills today, denies any side effects to medications. She request referral to gynecologist for Pap smear. She is up-to-date with her health maintenance including colonoscopy and mammogram. Her only concern today is left low back discomfort but no real pain. Patient has No headache, No chest pain, No abdominal pain - No Nausea, No new weakness tingling or numbness, No Cough - SOB.  Problem  Essential Hypertension  Allergy    ALLERGIES: Allergies  Allergen Reactions  . Amoxicillin Swelling    Throat Swells  . Azithromycin Swelling    Throat Swelling  . Bromfed Swelling    Throat Swelling  . Cephalexin Swelling    Throat Swelling  . Chlordiazepoxide-Clidinium Swelling    Throat Swelling  . Clotrimazole Other (See Comments) and Hypertension    Patient told me that she couldn't swallow due to the medication  . Dicyclomine Hcl Hives  . Hydralazine Hcl     Rash and itching  . Ibuprofen     N/V  . Iohexol      Code: HIVES, Desc: throat swelling no hives 20 yrs ago;needs pre-medication  09/19/07 sg, Onset Date: [id number]   . Latex Swelling    Blisters on Skin  . Lidocaine Hives  . Paroxetine Swelling    Throat Swelling  . Penicillins Hives  . Propoxyphene N-Acetaminophen Swelling    REACTION: swelling in the throat  . Sertraline Hcl Swelling    Throat Swelling  . Sulfadiazine Swelling    Throat Swelling  . Verapamil Swelling    Throat Swelling     PAST MEDICAL HISTORY: Past Medical History  Diagnosis Date  . Myocardial infarct 2005  . CAD (coronary artery disease)   . Hypertension   . Long term (current) use of anticoagulants   . Edema   . Hematoma   . Sinusitis acute   . Adenocarcinoma of breast     right  . Acute upper respiratory infections of unspecified site   . Depression   . Anal fissure   . GERD (gastroesophageal reflux disease)   . Dog bite(E906.0)   . Diverticulosis of colon (without mention of hemorrhage)   . Spinal stenosis, lumbar region, without neurogenic claudication   . Anxiety   . Panic attacks   . Asthma   . Irritable bowel syndrome   . Hiatal hernia   . Jaundice     Hx of Jaundice at age 87 from "dirty restuarant". Unsure of Hepatitis type    MEDICATIONS AT HOME: Prior to Admission medications   Medication Sig Start Date End Date Taking? Authorizing Provider  ALPRAZolam Duanne Moron) 1 MG tablet Take 0.5-1 tablets (0.5-1 mg total) by mouth 2 (two) times daily as needed for anxiety or sleep. 03/24/14  Yes Tresa Garter, MD  ascorbic acid (VITAMIN C) 1000 MG tablet Take 1,000 mg by mouth daily.   Yes Historical Provider, MD  aspirin 81 MG tablet Take 81  mg by mouth daily.     Yes Historical Provider, MD  Calcium Carbonate-Vitamin D (CALCIUM-CARB 600 + D) 600-125 MG-UNIT TABS Take 1 capsule by mouth daily.    Yes Historical Provider, MD  Cetirizine HCl 10 MG CAPS Take 1 capsule (10 mg total) by mouth daily. 03/24/14  Yes Tresa Garter, MD  cycloSPORINE (RESTASIS) 0.05 % ophthalmic emulsion Place 1 drop into both eyes 2 (two) times daily.     Yes Historical Provider, MD  Fluticasone-Salmeterol (ADVAIR) 250-50 MCG/DOSE AEPB Inhale 1 puff into the lungs 2 (two) times daily. 03/24/14  Yes Tresa Garter, MD  gabapentin (NEURONTIN) 300 MG capsule Take 1 capsule (300 mg total) by mouth at bedtime. Take 1 cap at bedtime then 2 caps times 7 days then 3 caps qhs 08/19/13  Yes Janece Canterbury, MD   Ipratropium-Albuterol (COMBIVENT) 20-100 MCG/ACT AERS respimat Inhale 1 puff into the lungs every 6 (six) hours as needed for wheezing or shortness of breath. 12/23/13  Yes Tresa Garter, MD  metoprolol (LOPRESSOR) 50 MG tablet Take 1 tablet (50 mg total) by mouth daily. 03/24/14  Yes Tresa Garter, MD  pantoprazole (PROTONIX) 40 MG tablet Take 1 tablet (40 mg total) by mouth daily. 03/24/14  Yes Tresa Garter, MD  pravastatin (PRAVACHOL) 40 MG tablet Take 1 tablet (40 mg total) by mouth every evening. 03/24/14  Yes Tresa Garter, MD  Probiotic Product (PROBIOTIC DAILY) CAPS Take 1 capsule by mouth daily.   Yes Historical Provider, MD  valsartan (DIOVAN) 320 MG tablet Take in am daily 12/23/13  Yes Sherita Decoste E Doreene Burke, MD  vitamin E (VITAMIN E) 400 UNIT capsule Take 400 Units by mouth daily.   Yes Historical Provider, MD  VOLTAREN 1 % GEL Apply 2 g topically 2 (two) times daily as needed (pain).  06/30/13  Yes Historical Provider, MD  acetaminophen (TYLENOL) 500 MG tablet Take 500 mg by mouth every 8 (eight) hours as needed (pain).     Historical Provider, MD  Camphor-Menthol-Methyl Sal (SALONPAS) 1.2-5.7-6.3 % PTCH Apply 3 patches topically daily. Removes patches at night    Historical Provider, MD  dextromethorphan (DELSYM) 30 MG/5ML liquid Take 10 mLs (60 mg total) by mouth 2 (two) times daily. For cough 04/25/14   Billy Fischer, MD  doxycycline (VIBRA-TABS) 100 MG tablet Take 1 tablet (100 mg total) by mouth 2 (two) times daily. 01/21/14   Tresa Garter, MD  fluticasone (FLONASE) 50 MCG/ACT nasal spray Place 2 sprays into both nostrils daily. 05/12/14   Tresa Garter, MD  hydrOXYzine (VISTARIL) 100 MG capsule Take 1 capsule (100 mg total) by mouth 3 (three) times daily as needed for itching (allergies). 11/26/13   Theodis Blaze, MD  Linaclotide (LINZESS) 290 MCG CAPS capsule Take 1 capsule (290 mcg total) by mouth daily. 08/16/13   Sable Feil, MD  moxifloxacin  (AVELOX) 400 MG tablet Take 1 tablet (400 mg total) by mouth daily. One tab daily 04/25/14   Billy Fischer, MD  rifaximin (XIFAXAN) 550 MG TABS tablet Take 1 tablet (550 mg total) by mouth 2 (two) times daily. 08/16/13   Sable Feil, MD  sodium chloride (OCEAN) 0.65 % nasal spray Place 1 spray into the nose as needed for congestion.    Historical Provider, MD     Objective:   Filed Vitals:   05/12/14 1118  BP: 154/94  Pulse: 75  Temp: 98.4 F (36.9 C)  TempSrc: Oral  Resp:  18  Height: 5\' 3"  (1.6 m)  Weight: 175 lb (79.379 kg)  SpO2: 99%    Exam General appearance : Awake, alert, not in any distress. Speech Clear. Not toxic looking HEENT: Atraumatic and Normocephalic, pupils equally reactive to light and accomodation Neck: supple, no JVD. No cervical lymphadenopathy.  Chest:Good air entry bilaterally, no added sounds  CVS: S1 S2 regular, no murmurs.  Abdomen: Bowel sounds present, Non tender and not distended with no gaurding, rigidity or rebound. Extremities: B/L Lower Ext shows no edema, both legs are warm to touch Neurology: Awake alert, and oriented X 3, CN II-XII intact, Non focal Skin:No Rash Wounds:N/A  Data Review Lab Results  Component Value Date   HGBA1C 5.8* 11/26/2013   HGBA1C 6.2 06/10/2011   HGBA1C  Value: 5.9 (NOTE)                                                                       According to the ADA Clinical Practice Recommendations for 2011, when HbA1c is used as a screening test:   >=6.5%   Diagnostic of Diabetes Mellitus           (if abnormal result  is confirmed)  5.7-6.4%   Increased risk of developing Diabetes Mellitus  References:Diagnosis and Classification of Diabetes Mellitus,Diabetes [id number],0355,97(CBULA 1):S62-S69 and Standards of Medical Care in         Diabetes - 2011,Diabetes [id number],32  (Suppl 1):S11-S61.* 07/16/2010     Assessment & Plan   1. Essential hypertension - DASH Diet - Continue metoprolol as prescribed  2. Allergy,  subsequent encounter  - fluticasone (FLONASE) 50 MCG/ACT nasal spray; Place 2 sprays into both nostrils daily.  Dispense: 16 g; Refill: 6  3. HLD (hyperlipidemia)  - Lipid Panel  4. Pap smear for cervical cancer screening  - Ambulatory referral to Gynecology  Patient was counseled extensively about nutrition and exercise.  Return in about 2 months (around 07/12/2014), or if symptoms worsen or fail to improve, for Follow up HTN, Annual Physical.  The patient was given clear instructions to go to ER or return to medical center if symptoms don't improve, worsen or new problems develop. The patient verbalized understanding. The patient was told to call to get lab results if they haven't heard anything in the next week.   This note has been created with Surveyor, quantity. Any transcriptional errors are unintentional.    Angelica Chessman, MD, [place], [place], [place] [facility] [place], [place]   05/12/2014, 12:03 PM

## 2014-05-13 ENCOUNTER — Encounter: Payer: Self-pay | Admitting: Obstetrics & Gynecology

## 2014-05-20 ENCOUNTER — Ambulatory Visit: Payer: Self-pay

## 2014-05-30 DIAGNOSIS — M47817 Spondylosis without myelopathy or radiculopathy, lumbosacral region: Secondary | ICD-10-CM | POA: Diagnosis not present

## 2014-05-30 DIAGNOSIS — G894 Chronic pain syndrome: Secondary | ICD-10-CM | POA: Diagnosis not present

## 2014-05-30 DIAGNOSIS — M5137 Other intervertebral disc degeneration, lumbosacral region: Secondary | ICD-10-CM | POA: Diagnosis not present

## 2014-05-30 DIAGNOSIS — M5430 Sciatica, unspecified side: Secondary | ICD-10-CM | POA: Diagnosis not present

## 2014-06-06 ENCOUNTER — Other Ambulatory Visit: Payer: Self-pay | Admitting: *Deleted

## 2014-06-06 ENCOUNTER — Telehealth: Payer: Self-pay | Admitting: Emergency Medicine

## 2014-06-06 DIAGNOSIS — Z853 Personal history of malignant neoplasm of breast: Secondary | ICD-10-CM

## 2014-06-06 NOTE — Telephone Encounter (Signed)
Pt given lab results with instructions to control diet with low fats and exercising

## 2014-06-06 NOTE — Telephone Encounter (Signed)
Message copied by Ricci Barker on Fri Jun 06, 2014  3:40 PM ------      Message from: [name]      Created: Wed Jun 04, 2014 10:27 AM       Please inform patient that her lipid panel shows slight increase triglyceride level. This can be managed with dietary control including low-cholesterol and low-fat diet as well as regular physical exercise. ------

## 2014-06-09 ENCOUNTER — Ambulatory Visit (HOSPITAL_BASED_OUTPATIENT_CLINIC_OR_DEPARTMENT_OTHER): Payer: Medicare Other | Admitting: Hematology and Oncology

## 2014-06-09 ENCOUNTER — Other Ambulatory Visit (HOSPITAL_BASED_OUTPATIENT_CLINIC_OR_DEPARTMENT_OTHER): Payer: Medicare Other

## 2014-06-09 ENCOUNTER — Telehealth: Payer: Self-pay | Admitting: Hematology and Oncology

## 2014-06-09 ENCOUNTER — Encounter: Payer: Self-pay | Admitting: Hematology and Oncology

## 2014-06-09 VITALS — BP 166/83 | HR 75 | Temp 98.7°F | Resp 20 | Ht 63.0 in | Wt 177.3 lb

## 2014-06-09 DIAGNOSIS — C50211 Malignant neoplasm of upper-inner quadrant of right female breast: Secondary | ICD-10-CM

## 2014-06-09 DIAGNOSIS — Z853 Personal history of malignant neoplasm of breast: Secondary | ICD-10-CM

## 2014-06-09 LAB — CBC WITH DIFFERENTIAL/PLATELET
BASO%: 0.9 % (ref 0.0–2.0)
BASOS ABS: 0 10*3/uL (ref 0.0–0.1)
EOS%: 0.8 % (ref 0.0–7.0)
Eosinophils Absolute: 0 10*3/uL (ref 0.0–0.5)
HCT: 39.9 % (ref 34.8–46.6)
HEMOGLOBIN: 12.9 g/dL (ref 11.6–15.9)
LYMPH#: 1.4 10*3/uL (ref 0.9–3.3)
LYMPH%: 29.9 % (ref 14.0–49.7)
MCH: 29.7 pg (ref 25.1–34.0)
MCHC: 32.4 g/dL (ref 31.5–36.0)
MCV: 91.5 fL (ref 79.5–101.0)
MONO#: 0.3 10*3/uL (ref 0.1–0.9)
MONO%: 7 % (ref 0.0–14.0)
NEUT#: 2.8 10*3/uL (ref 1.5–6.5)
NEUT%: 61.4 % (ref 38.4–76.8)
Platelets: 190 10*3/uL (ref 145–400)
RBC: 4.36 10*6/uL (ref 3.70–5.45)
RDW: 14.1 % (ref 11.2–14.5)
WBC: 4.6 10*3/uL (ref 3.9–10.3)

## 2014-06-09 LAB — COMPREHENSIVE METABOLIC PANEL (CC13)
ALBUMIN: 3.9 g/dL (ref 3.5–5.0)
ALK PHOS: 72 U/L (ref 40–150)
ALT: 18 U/L (ref 0–55)
AST: 16 U/L (ref 5–34)
Anion Gap: 7 mEq/L (ref 3–11)
BUN: 14.5 mg/dL (ref 7.0–26.0)
CHLORIDE: 109 meq/L (ref 98–109)
CO2: 29 mEq/L (ref 22–29)
Calcium: 10 mg/dL (ref 8.4–10.4)
Creatinine: 0.9 mg/dL (ref 0.6–1.1)
Glucose: 126 mg/dl (ref 70–140)
POTASSIUM: 3.9 meq/L (ref 3.5–5.1)
SODIUM: 145 meq/L (ref 136–145)
TOTAL PROTEIN: 7 g/dL (ref 6.4–8.3)
Total Bilirubin: 0.91 mg/dL (ref 0.20–1.20)

## 2014-06-09 NOTE — Assessment & Plan Note (Addendum)
Right breast cancer T2, N0, M0 stage II A. pathologic staging after neoadjuvant chemotherapy for ER/PR negative HER-2 negative breast cancer treated with lumpectomy followed by radiation  Patient is here for annual followup. She does not have any new lumps or bumps or any concerns. She had mammograms done in June 2000 the return of urine was normal. Her physical exam today does not reveal any abnormalities in the breast. I recommended once he and followups. She will continue to undergo mammograms in June of the year. I reviewed her blood work which was also normal.  We will see her back in one year for clinical followup breast exam and to review the mammograms. Discussed the importance of physical exercise in decreasing the likelihood of breast cancer recurrence. Recommended 30 mins daily 6 days a week of either brisk walking or cycling or swimming. Encouraged patient to eat more fruits and vegetables and decrease red meat. Patient has elevated triglyceride levels and I encouraged her that exercise will help that as well.  

## 2014-06-09 NOTE — Telephone Encounter (Signed)
per pof to sch pt appt-gave pt copy of sch °

## 2014-06-09 NOTE — Progress Notes (Signed)
Patient Care Team: Tresa Garter, MD as PCP - General (Internal Medicine)  DIAGNOSIS: Breast cancer of upper-inner quadrant of right female breast   Primary site: Breast (Right)   Staging method: AJCC 6th Edition   Pathologic: Stage IIA (T2, N0, M0) signed by Rulon Eisenmenger, MD on 06/09/2014  4:41 PM   Summary: Stage IIA (T2, N0, M0)   SUMMARY OF ONCOLOGIC HISTORY:   Breast cancer of upper-inner quadrant of right female breast   08/29/2007 Mammogram Right breast mass macrolobulated 1.5 x 1.2 x 1.8 cm and 1.2 x 1.0 x 1.7 cm biopsy of lateral mass IDC ER 1% PR 0% HER-2 negative Ki-67 38%, MRI bilobed mass 4.9 x 2.6 x 4.8 cm   09/18/2007 - 03/03/2008 Neo-Adjuvant Chemotherapy FEC x4 followed by dose dense Taxotere with Xeloda 1000 mg by mouth twice a day for 8 weeks (could not tolerate increased dose of Xeloda to 1500 mg), MRI showed decreased mass 3.4 x 2.6 x 3 cm   05/06/2008 Surgery Right breast lumpectomy 3.8 cm tumor SLN negative T2, N0, M0 stage II A. pathologic staging   06/03/2008 - 07/22/2008 Radiation Therapy Radiation therapy to lumpectomy site    CHIEF COMPLIANT: Annual followup of breast cancer  INTERVAL HISTORY: [name] is a 63 year old Caucasian with above-mentioned history of breast cancer that was diagnosed in 2008 treated with neoadjuvant chemotherapy followed by lumpectomy and radiation. She because she was triple negative, she did not need any antiestrogen therapy. She has had annual mammograms ever since with physical exams. Last mammogram was done in June 2015. It was normal and she is here without any new complaints or concerns.  REVIEW OF SYSTEMS:   Constitutional: Denies fevers, chills or abnormal weight loss Eyes: Denies blurriness of vision Ears, nose, mouth, throat, and face: Denies mucositis or sore throat Respiratory: Denies cough, dyspnea or wheezes Cardiovascular: Denies palpitation, chest discomfort or lower extremity swelling Gastrointestinal:  Denies  nausea, heartburn or change in bowel habits Skin: Denies abnormal skin rashes Lymphatics: Denies new lymphadenopathy or easy bruising Neurological:Denies numbness, tingling or new weaknesses Behavioral/Psych: Mood is stable, no new changes  Breast:  denies any pain or lumps or nodules in either breasts All other systems were reviewed with the patient and are negative.  I have reviewed the past medical history, past surgical history, social history and family history with the patient and they are unchanged from previous note.  ALLERGIES:  is allergic to amoxicillin; azithromycin; bromfed; cephalexin; chlordiazepoxide-clidinium; clotrimazole; dicyclomine hcl; gatifloxacin; hydralazine hcl; ibuprofen; iohexol; latex; lidocaine; lisinopril; librax ; paroxetine; penicillins; prednisone; pregabalin; propoxyphene n-acetaminophen; sertraline; sertraline hcl; sulfa antibiotics; sulfadiazine; and verapamil.  MEDICATIONS:  Current Outpatient Prescriptions  Medication Sig Dispense Refill  . acetaminophen (TYLENOL) 500 MG tablet Take 500 mg by mouth every 8 (eight) hours as needed (pain).       Marland Kitchen ALPRAZolam (XANAX) 1 MG tablet Take 0.5-1 tablets (0.5-1 mg total) by mouth 2 (two) times daily as needed for anxiety or sleep.  60 tablet  3  . ascorbic acid (VITAMIN C) 1000 MG tablet Take 1,000 mg by mouth daily.      Marland Kitchen aspirin 81 MG tablet Take 81 mg by mouth daily.        . Calcium Carbonate-Vitamin D (CALCIUM-CARB 600 + D) 600-125 MG-UNIT TABS Take 1 capsule by mouth daily.       . Cetirizine HCl 10 MG CAPS Take 1 capsule (10 mg total) by mouth daily.  90 capsule  3  .  cycloSPORINE (RESTASIS) 0.05 % ophthalmic emulsion Place 1 drop into both eyes 2 (two) times daily.        Marland Kitchen doxycycline (VIBRA-TABS) 100 MG tablet Take 1 tablet (100 mg total) by mouth 2 (two) times daily.  20 tablet  0  . fluticasone (FLONASE) 50 MCG/ACT nasal spray Place 2 sprays into both nostrils daily.  16 g  6  . Fluticasone-Salmeterol  (ADVAIR) 250-50 MCG/DOSE AEPB Inhale 1 puff into the lungs 2 (two) times daily.  60 each  3  . gabapentin (NEURONTIN) 300 MG capsule Take 1 capsule (300 mg total) by mouth at bedtime. Take 1 cap at bedtime then 2 caps times 7 days then 3 caps qhs  30 capsule  0  . hydrOXYzine (VISTARIL) 100 MG capsule Take 1 capsule (100 mg total) by mouth 3 (three) times daily as needed for itching (allergies).  90 capsule  0  . Ipratropium-Albuterol (COMBIVENT) 20-100 MCG/ACT AERS respimat Inhale 1 puff into the lungs every 6 (six) hours as needed for wheezing or shortness of breath.  1 Inhaler  1  . Linaclotide (LINZESS) 290 MCG CAPS capsule Take 1 capsule (290 mcg total) by mouth daily.  12 capsule  0  . metoprolol (LOPRESSOR) 50 MG tablet Take 1 tablet (50 mg total) by mouth daily.  90 tablet  3  . pantoprazole (PROTONIX) 40 MG tablet Take 1 tablet (40 mg total) by mouth daily.  90 tablet  3  . pravastatin (PRAVACHOL) 40 MG tablet Take 1 tablet (40 mg total) by mouth every evening.  90 tablet  3  . Probiotic Product (PROBIOTIC DAILY) CAPS Take 1 capsule by mouth daily.      Marland Kitchen tiZANidine (ZANAFLEX) 4 MG tablet Take 1 to 2 tablets per day as needed      . valsartan (DIOVAN) 320 MG tablet Take in am daily  90 tablet  3  . vitamin E (VITAMIN E) 400 UNIT capsule Take 400 Units by mouth daily.      . VOLTAREN 1 % GEL Apply 2 g topically 2 (two) times daily as needed (pain).        No current facility-administered medications for this visit.    PHYSICAL EXAMINATION: ECOG PERFORMANCE STATUS: 0 - Asymptomatic  Filed Vitals:   06/09/14 1530  BP: 166/83  Pulse: 75  Temp: 98.7 F (37.1 C)  Resp: 20   Filed Weights   06/09/14 1530  Weight: 177 lb 4.8 oz (80.423 kg)    GENERAL:alert, no distress and comfortable SKIN: skin color, texture, turgor are normal, no rashes or significant lesions EYES: normal, Conjunctiva are pink and non-injected, sclera clear OROPHARYNX:no exudate, no erythema and lips, buccal  mucosa, and tongue normal  NECK: supple, thyroid normal size, non-tender, without nodularity LYMPH:  no palpable lymphadenopathy in the cervical, axillary or inguinal LUNGS: clear to auscultation and percussion with normal breathing effort HEART: regular rate & rhythm and no murmurs and no lower extremity edema ABDOMEN:abdomen soft, non-tender and normal bowel sounds Musculoskeletal:no cyanosis of digits and no clubbing  NEURO: alert & oriented x 3 with fluent speech, no focal motor/sensory deficits BREAST: No palpable masses or nodules in either right or left breasts. No palpable axillary supraclavicular or infraclavicular adenopathy no breast tenderness or nipple discharge.   LABORATORY DATA:  I have reviewed the data as listed   Chemistry      Component Value Date/Time   NA 145 06/09/2014 1515   NA 141 01/09/2014 1551   K 3.9  06/09/2014 1515   K 4.0 01/09/2014 1551   CL 105 01/09/2014 1551   CL 104 12/07/2012 1341   CO2 29 06/09/2014 1515   CO2 28 01/09/2014 1551   BUN 14.5 06/09/2014 1515   BUN 17 01/09/2014 1551   CREATININE 0.9 06/09/2014 1515   CREATININE 0.81 01/09/2014 1551   CREATININE 0.83 08/18/2013 0526      Component Value Date/Time   CALCIUM 10.0 06/09/2014 1515   CALCIUM 9.4 01/09/2014 1551   ALKPHOS 72 06/09/2014 1515   ALKPHOS 59 01/09/2014 1551   AST 16 06/09/2014 1515   AST 17 01/09/2014 1551   ALT 18 06/09/2014 1515   ALT 16 01/09/2014 1551   BILITOT 0.91 06/09/2014 1515   BILITOT 0.7 01/09/2014 1551       Lab Results  Component Value Date   WBC 4.6 06/09/2014   HGB 12.9 06/09/2014   HCT 39.9 06/09/2014   MCV 91.5 06/09/2014   PLT 190 06/09/2014   NEUTROABS 2.8 06/09/2014     RADIOGRAPHIC STUDIES: I have personally reviewed the radiology reports and agreed with their findings. No results found.   ASSESSMENT & PLAN:  Breast cancer of upper-inner quadrant of right female breast Right breast cancer T2, N0, M0 stage II A. pathologic staging after neoadjuvant  chemotherapy for ER/PR negative HER-2 negative breast cancer treated with lumpectomy followed by radiation  Patient is here for annual followup. She does not have any new lumps or bumps or any concerns. She had mammograms done in June 2000 the return of urine was normal. Her physical exam today does not reveal any abnormalities in the breast. I recommended once he and followups. She will continue to undergo mammograms in June of the year. I reviewed her blood work which was also normal.  We will see her back in one year for clinical followup breast exam and to review the mammograms. Discussed the importance of physical exercise in decreasing the likelihood of breast cancer recurrence. Recommended 30 mins daily 6 days a week of either brisk walking or cycling or swimming. Encouraged patient to eat more fruits and vegetables and decrease red meat. Patient has elevated triglyceride levels and I encouraged her that exercise will help that as well.    Orders Placed This Encounter  Procedures  . CBC with Differential    Standing Status: Future     Number of Occurrences:      Standing Expiration Date: 06/09/2015  . Comprehensive metabolic panel (Cmet) - [facility]    Standing Status: Future     Number of Occurrences:      Standing Expiration Date: 06/09/2015   The patient has a good understanding of the overall plan. she agrees with it. She will call with any problems that may develop before her next visit here.  I spent 20 minutes counseling the patient face to face. The total time spent in the appointment was 25 minutes and more than 50% was on counseling and review of test results    Rulon Eisenmenger, MD 06/09/2014 4:44 PM

## 2014-06-12 NOTE — Addendum Note (Signed)
Addended by: Prentiss Bells on: 06/12/2014 01:33 PM   Modules accepted: Orders

## 2014-06-18 ENCOUNTER — Encounter: Payer: Self-pay | Admitting: Obstetrics & Gynecology

## 2014-06-23 DIAGNOSIS — Z0000 Encounter for general adult medical examination without abnormal findings: Secondary | ICD-10-CM | POA: Diagnosis not present

## 2014-06-23 DIAGNOSIS — Z01419 Encounter for gynecological examination (general) (routine) without abnormal findings: Secondary | ICD-10-CM | POA: Diagnosis not present

## 2014-07-14 ENCOUNTER — Ambulatory Visit: Payer: Medicare Other | Attending: Internal Medicine | Admitting: Internal Medicine

## 2014-07-14 ENCOUNTER — Encounter: Payer: Self-pay | Admitting: Internal Medicine

## 2014-07-14 VITALS — BP 148/101 | HR 74 | Temp 98.9°F | Resp 16 | Ht 63.0 in | Wt 173.0 lb

## 2014-07-14 DIAGNOSIS — F419 Anxiety disorder, unspecified: Secondary | ICD-10-CM | POA: Diagnosis not present

## 2014-07-14 DIAGNOSIS — J069 Acute upper respiratory infection, unspecified: Secondary | ICD-10-CM | POA: Diagnosis not present

## 2014-07-14 DIAGNOSIS — K219 Gastro-esophageal reflux disease without esophagitis: Secondary | ICD-10-CM

## 2014-07-14 DIAGNOSIS — J45909 Unspecified asthma, uncomplicated: Secondary | ICD-10-CM | POA: Diagnosis not present

## 2014-07-14 DIAGNOSIS — Z79899 Other long term (current) drug therapy: Secondary | ICD-10-CM | POA: Insufficient documentation

## 2014-07-14 DIAGNOSIS — Z7951 Long term (current) use of inhaled steroids: Secondary | ICD-10-CM | POA: Diagnosis not present

## 2014-07-14 DIAGNOSIS — F411 Generalized anxiety disorder: Secondary | ICD-10-CM

## 2014-07-14 DIAGNOSIS — F410 Panic disorder [episodic paroxysmal anxiety] without agoraphobia: Secondary | ICD-10-CM | POA: Insufficient documentation

## 2014-07-14 DIAGNOSIS — Z7982 Long term (current) use of aspirin: Secondary | ICD-10-CM | POA: Diagnosis not present

## 2014-07-14 DIAGNOSIS — I10 Essential (primary) hypertension: Secondary | ICD-10-CM | POA: Diagnosis not present

## 2014-07-14 DIAGNOSIS — I2510 Atherosclerotic heart disease of native coronary artery without angina pectoris: Secondary | ICD-10-CM | POA: Diagnosis not present

## 2014-07-14 DIAGNOSIS — I252 Old myocardial infarction: Secondary | ICD-10-CM | POA: Diagnosis not present

## 2014-07-14 DIAGNOSIS — E785 Hyperlipidemia, unspecified: Secondary | ICD-10-CM

## 2014-07-14 MED ORDER — VALSARTAN 320 MG PO TABS
ORAL_TABLET | ORAL | Status: DC
Start: 2014-07-14 — End: 2014-08-02

## 2014-07-14 MED ORDER — ALPRAZOLAM 1 MG PO TABS: 0.5000 mg | ORAL_TABLET | Freq: Two times a day (BID) | ORAL | Status: DC | PRN

## 2014-07-14 MED ORDER — PRAVASTATIN SODIUM 40 MG PO TABS: 40.0000 mg | ORAL_TABLET | Freq: Every evening | ORAL | Status: DC

## 2014-07-14 MED ORDER — METOPROLOL TARTRATE 50 MG PO TABS: 50.0000 mg | ORAL_TABLET | Freq: Every day | ORAL | Status: DC

## 2014-07-14 MED ORDER — PANTOPRAZOLE SODIUM 40 MG PO TBEC: 40.0000 mg | DELAYED_RELEASE_TABLET | Freq: Every day | ORAL | Status: DC

## 2014-07-14 NOTE — Progress Notes (Signed)
Pt is here following up on her HTN. Pt states that she is fighting a cold since Friday. Pt has pain in her ears and throat w/ chest congestion and progressively getting worse.

## 2014-07-14 NOTE — Progress Notes (Signed)
Patient ID: [name], female   DOB: 1951-07-08, 63 y.o.   MRN: [id number]   [name], is a 63 y.o. female  BEM:[id number]  OFH:[id number]  DOB - [birth year]  Chief Complaint  Patient presents with  . Follow-up        Subjective:   [name] is a 63 y.o. female here today for a follow up visit. PMH significant for HTN, asthma, CAD, GERD, anxiety/depression, hyperlipidemia, and insomnia.  She is a nonsmoker.  She presents today with complaints of cough, facial pain, and sore throat for 4 days.  She endorses intermittent fever with max temp of 101 deg on Saturday.  She has had a decreased appetite but has been trying to remain hydrated.  She has been using her Advair as prescribed and took an extra dose last night because of a coughing spell.  She has not used her rescue inhaler recently.  Regarding her chronic health issues, she reports compliance with her medications and has taken them today.  She is requesting refills of all of her medications today. She would like the influenza vaccination but would like to postpone receiving it until healthy.    No problems updated.  ALLERGIES: Allergies  Allergen Reactions  . Amoxicillin Swelling    Throat Swells  . Azithromycin Swelling    Throat Swelling  . Bromfed Swelling    Throat Swelling  . Cephalexin Swelling    Throat Swelling  . Chlordiazepoxide-Clidinium Swelling    Throat Swelling  . Clotrimazole Other (See Comments) and Hypertension    Patient told me that she couldn't swallow due to the medication  . Dicyclomine Hcl Hives  . Gatifloxacin     Other reaction(s): Other (See Comments)  . Hydralazine Hcl     Rash and itching  . Ibuprofen     N/V  . Iohexol      Code: HIVES, Desc: throat swelling no hives 20 yrs ago;needs pre-medication  09/19/07 sg, Onset Date: [id number]   . Latex Swelling    Blisters on Skin  . Lidocaine Hives  . Lisinopril     Other reaction(s): Angioedema (ALLERGY/intolerance)  . Librax   [Chlordiazepoxide-Clidinium]     Other reaction(s): Other (See Comments)  . Paroxetine Swelling    Throat Swelling  . Penicillins Hives  . Prednisone Swelling    Throat swelling  . Pregabalin     Other reaction(s): Other (See Comments) nervousness  . Propoxyphene N-Acetaminophen Swelling    REACTION: swelling in the throat  . Sertraline     Other reaction(s): Other (See Comments)  . Sertraline Hcl Swelling    Throat Swelling  . Sulfa Antibiotics     Other reaction(s): Unknown  . Sulfadiazine Swelling    Throat Swelling  . Verapamil Swelling    Throat Swelling    PAST MEDICAL HISTORY: Past Medical History  Diagnosis Date  . Myocardial infarct 2005  . CAD (coronary artery disease)   . Hypertension   . Long term (current) use of anticoagulants   . Edema   . Hematoma   . Sinusitis acute   . Adenocarcinoma of breast     right  . Acute upper respiratory infections of unspecified site   . Depression   . Anal fissure   . GERD (gastroesophageal reflux disease)   . Dog bite(E906.0)   . Diverticulosis of colon (without mention of hemorrhage)   . Spinal stenosis, lumbar region, without neurogenic claudication   . Anxiety   . Panic attacks   .  Asthma   . Irritable bowel syndrome   . Hiatal hernia   . Jaundice     Hx of Jaundice at age 40 from "dirty restuarant". Unsure of Hepatitis type    MEDICATIONS AT HOME: Prior to Admission medications   Medication Sig Start Date End Date Taking? Authorizing Provider  acetaminophen (TYLENOL) 500 MG tablet Take 500 mg by mouth every 8 (eight) hours as needed (pain).    Yes Historical Provider, MD  ALPRAZolam Duanne Moron) 1 MG tablet Take 0.5-1 tablets (0.5-1 mg total) by mouth 2 (two) times daily as needed for anxiety or sleep. 03/24/14  Yes Tresa Garter, MD  ascorbic acid (VITAMIN C) 1000 MG tablet Take 1,000 mg by mouth daily.   Yes Historical Provider, MD  aspirin 81 MG tablet Take 81 mg by mouth daily.     Yes Historical  Provider, MD  Calcium Carbonate-Vitamin D (CALCIUM-CARB 600 + D) 600-125 MG-UNIT TABS Take 1 capsule by mouth daily.    Yes Historical Provider, MD  cycloSPORINE (RESTASIS) 0.05 % ophthalmic emulsion Place 1 drop into both eyes 2 (two) times daily.     Yes Historical Provider, MD  doxycycline (VIBRA-TABS) 100 MG tablet Take 1 tablet (100 mg total) by mouth 2 (two) times daily. 01/21/14  Yes Tresa Garter, MD  fluticasone (FLONASE) 50 MCG/ACT nasal spray Place 2 sprays into both nostrils daily. 05/12/14  Yes Tresa Garter, MD  Fluticasone-Salmeterol (ADVAIR) 250-50 MCG/DOSE AEPB Inhale 1 puff into the lungs 2 (two) times daily. 03/24/14  Yes Tresa Garter, MD  gabapentin (NEURONTIN) 300 MG capsule Take 1 capsule (300 mg total) by mouth at bedtime. Take 1 cap at bedtime then 2 caps times 7 days then 3 caps qhs 08/19/13  Yes Janece Canterbury, MD  hydrOXYzine (VISTARIL) 100 MG capsule Take 1 capsule (100 mg total) by mouth 3 (three) times daily as needed for itching (allergies). 11/26/13  Yes Theodis Blaze, MD  Ipratropium-Albuterol (COMBIVENT) 20-100 MCG/ACT AERS respimat Inhale 1 puff into the lungs every 6 (six) hours as needed for wheezing or shortness of breath. 12/23/13  Yes Tresa Garter, MD  Linaclotide (LINZESS) 290 MCG CAPS capsule Take 1 capsule (290 mcg total) by mouth daily. 08/16/13  Yes Sable Feil, MD  metoprolol (LOPRESSOR) 50 MG tablet Take 1 tablet (50 mg total) by mouth daily. 03/24/14  Yes Tresa Garter, MD  pantoprazole (PROTONIX) 40 MG tablet Take 1 tablet (40 mg total) by mouth daily. 03/24/14  Yes Tresa Garter, MD  pravastatin (PRAVACHOL) 40 MG tablet Take 1 tablet (40 mg total) by mouth every evening. 03/24/14  Yes Tresa Garter, MD  Probiotic Product (PROBIOTIC DAILY) CAPS Take 1 capsule by mouth daily.   Yes Historical Provider, MD  valsartan (DIOVAN) 320 MG tablet Take in am daily 12/23/13  Yes Olugbemiga E Doreene Burke, MD  vitamin E (VITAMIN E)  400 UNIT capsule Take 400 Units by mouth daily.   Yes Historical Provider, MD  VOLTAREN 1 % GEL Apply 2 g topically 2 (two) times daily as needed (pain).  06/30/13  Yes Historical Provider, MD  Cetirizine HCl 10 MG CAPS Take 1 capsule (10 mg total) by mouth daily. 03/24/14   Tresa Garter, MD  tiZANidine (ZANAFLEX) 4 MG tablet Take 1 to 2 tablets per day as needed 05/30/14   Historical Provider, MD   Review of Systems  Constitutional: Positive for fever, chills, malaise/fatigue and diaphoresis.  HENT: Positive for congestion and sore  throat.   Eyes: Negative.   Respiratory: Positive for cough, sputum production and wheezing.   Cardiovascular: Positive for chest pain.  Gastrointestinal: Negative.   Genitourinary: Negative.   Musculoskeletal: Positive for myalgias.  Skin: Negative.   Neurological: Positive for dizziness, weakness and headaches.  Endo/Heme/Allergies: Negative.   Psychiatric/Behavioral: The patient has insomnia.      Objective:   Filed Vitals:   07/14/14 1145  BP: 152/96  Pulse: 74  Temp: 98.9 F (37.2 C)  TempSrc: Oral  Resp: 16  Height: 5\' 3"  (1.6 m)  Weight: 173 lb (78.472 kg)  SpO2: 96%    Exam General appearance : Awake, alert, not in any distress. Speech Clear. Mildly ill appearing. HEENT: Atraumatic and Normocephalic, pupils equally reactive to light and accomodation. Tympanic membranes WNL, nasal turbinates pink with clear rhinorrhea, post nasal drip present in posterior oropharynx, facial tenderness to palpation.  Neck: supple, no JVD. No cervical lymphadenopathy.  Chest:Good air entry bilaterally in upper lobes, mildly decreased breath sounds in BLL. No added sounds  CVS: S1 S2 regular, no murmurs.  Abdomen: Bowel sounds present, Non tender and not distended with no gaurding, rigidity or rebound. Extremities: B/L Lower Ext shows no edema, both legs are warm to touch Neurology: Awake alert, and oriented X 3, CN II-XII intact, Non focal Skin:No  Rash Wounds:N/A  Data Review Lab Results  Component Value Date   HGBA1C 5.8* 11/26/2013   HGBA1C 6.2 06/10/2011   HGBA1C * 07/16/2010    5.9 (NOTE)                                                                       According to the ADA Clinical Practice Recommendations for 2011, when HbA1c is used as a screening test:   >=6.5%   Diagnostic of Diabetes Mellitus           (if abnormal result  is confirmed)  5.7-6.4%   Increased risk of developing Diabetes Mellitus  References:Diagnosis and Classification of Diabetes Mellitus,Diabetes QVZD,6387,56(EPPIR 1):S62-S69 and Standards of Medical Care in         Diabetes - 2011,Diabetes Care,2011,34  (Suppl 1):S11-S61.     Assessment & Plan   1. URI P: symptomatic management.  Encouraged pt to use rescue inhaler 3-4 times daily until illness is resolved and to continue Advair twice a day.  Rest and fluids encouraged.    2.  HTN P: BP today elevated.  Given acute illness, will defer adjusting medications until pt is well.  Encouraged pt to monitor her BP at home and bring in a log at next visit.  DASH diet discussed.    Linda Pester, FNP-student  Evaluation and management procedures were performed by the Advanced Practitioner under my supervision and collaboration. I have reviewed the Advanced Practitioner's note and chart, and I agree with the management and plan.   Assessment & Plan   1. Anxiety state  - ALPRAZolam (XANAX) 1 MG tablet; Take 0.5-1 tablets (0.5-1 mg total) by mouth 2 (two) times daily as needed for anxiety or sleep.  Dispense: 60 tablet; Refill: 3  2. Essential hypertension  - We have discussed target BP range and blood pressure goal - I have advised patient to check BP regularly and  to call us back or report to clinic if the numbers are consistently higher than 140/90  - We discussed the importance of compliance with medical therapy and DASH diet recommended, consequences of uncontrolled hypertension discussed.    - continue current BP medications  3. Gastroesophageal reflux disease without esophagitis Continue Protonix  4. HLD (hyperlipidemia)  - pravastatin (PRAVACHOL) 40 MG tablet; Take 1 tablet (40 mg total) by mouth every evening.  Dispense: 90 tablet; Refill: 3  To address this please limit saturated fat to no more than 7% of your calories, limit cholesterol to 200 mg/day, increase fiber and exercise as tolerated. If needed we may add another cholesterol lowering medication to your regimen.    Patient have been counseled extensively about nutrition and exercise  Return in about 2 weeks (around 07/28/2014), or if symptoms worsen or fail to improve, for BP Check, Nurse Visit.  The patient was given clear instructions to go to ER or return to medical center if symptoms don't improve, worsen or new problems develop. The patient verbalized understanding. The patient was told to call to get lab results if they haven't heard anything in the next week.   This note has been created with Surveyor, quantity. Any transcriptional errors are unintentional.    Angelica Chessman, MD, [place], [place], [place], [place] and [place], [place]   07/14/2014, 11:52 AM

## 2014-07-14 NOTE — Patient Instructions (Signed)
Hypertension Hypertension, commonly called high blood pressure, is when the force of blood pumping through your arteries is too strong. Your arteries are the blood vessels that carry blood from your heart throughout your body. A blood pressure reading consists of a higher number over a lower number, such as 110/72. The higher number (systolic) is the pressure inside your arteries when your heart pumps. The lower number (diastolic) is the pressure inside your arteries when your heart relaxes. Ideally you want your blood pressure below 120/80. Hypertension forces your heart to work harder to pump blood. Your arteries may become narrow or stiff. Having hypertension puts you at risk for heart disease, stroke, and other problems.  RISK FACTORS Some risk factors for high blood pressure are controllable. Others are not.  Risk factors you cannot control include:   Race. You may be at higher risk if you are African American.  Age. Risk increases with age.  Gender. Men are at higher risk than women before age 45 years. After age 65, women are at higher risk than men. Risk factors you can control include:  Not getting enough exercise or physical activity.  Being overweight.  Getting too much fat, sugar, calories, or salt in your diet.  Drinking too much alcohol. SIGNS AND SYMPTOMS Hypertension does not usually cause signs or symptoms. Extremely high blood pressure (hypertensive crisis) may cause headache, anxiety, shortness of breath, and nosebleed. DIAGNOSIS  To check if you have hypertension, your health care provider will measure your blood pressure while you are seated, with your arm held at the level of your heart. It should be measured at least twice using the same arm. Certain conditions can cause a difference in blood pressure between your right and left arms. A blood pressure reading that is higher than normal on one occasion does not mean that you need treatment. If one blood pressure reading  is high, ask your health care provider about having it checked again. TREATMENT  Treating high blood pressure includes making lifestyle changes and possibly taking medicine. Living a healthy lifestyle can help lower high blood pressure. You may need to change some of your habits. Lifestyle changes may include:  Following the DASH diet. This diet is high in fruits, vegetables, and whole grains. It is low in salt, red meat, and added sugars.  Getting at least 2 hours of brisk physical activity every week.  Losing weight if necessary.  Not smoking.  Limiting alcoholic beverages.  Learning ways to reduce stress. If lifestyle changes are not enough to get your blood pressure under control, your health care provider may prescribe medicine. You may need to take more than one. Work closely with your health care provider to understand the risks and benefits. HOME CARE INSTRUCTIONS  Have your blood pressure rechecked as directed by your health care provider.   Take medicines only as directed by your health care provider. Follow the directions carefully. Blood pressure medicines must be taken as prescribed. The medicine does not work as well when you skip doses. Skipping doses also puts you at risk for problems.   Do not smoke.   Monitor your blood pressure at home as directed by your health care provider. SEEK MEDICAL CARE IF:   You think you are having a reaction to medicines taken.  You have recurrent headaches or feel dizzy.  You have swelling in your ankles.  You have trouble with your vision. SEEK IMMEDIATE MEDICAL CARE IF:  You develop a severe headache or confusion.    You have unusual weakness, numbness, or feel faint. °· You have severe chest or abdominal pain. °· You vomit repeatedly. °· You have trouble breathing. °MAKE SURE YOU:  °· Understand these instructions. °· Will watch your condition. °· Will get help right away if you are not doing well or get worse. °Document  Released: 08/29/2005 Document Revised: 01/13/2014 Document Reviewed: 06/21/2013 °ExitCare® Patient Information ©2015 [employer]. This information is not intended to replace advice given to you by your health care provider. Make sure you discuss any questions you have with your health care provider. ° °Upper Respiratory Infection, Adult °An upper respiratory infection (URI) is also sometimes known as the common cold. The upper respiratory tract includes the nose, sinuses, throat, trachea, and bronchi. Bronchi are the airways leading to the lungs. Most people improve within 1 week, but symptoms can last up to 2 weeks. A residual cough may last even longer.  °CAUSES °Many different viruses can infect the tissues lining the upper respiratory tract. The tissues become irritated and inflamed and often become very moist. Mucus production is also common. A cold is contagious. You can easily spread the virus to others by oral contact. This includes kissing, sharing a glass, coughing, or sneezing. Touching your mouth or nose and then touching a surface, which is then touched by another person, can also spread the virus. °SYMPTOMS  °Symptoms typically develop 1 to 3 days after you come in contact with a cold virus. Symptoms vary from person to person. They may include: °· Runny nose. °· Sneezing. °· Nasal congestion. °· Sinus irritation. °· Sore throat. °· Loss of voice (laryngitis). °· Cough. °· Fatigue. °· Muscle aches. °· Loss of appetite. °· Headache. °· Low-grade fever. °DIAGNOSIS  °You might diagnose your own cold based on familiar symptoms, since most people get a cold 2 to 3 times a year. Your caregiver can confirm this based on your exam. Most importantly, your caregiver can check that your symptoms are not due to another disease such as strep throat, sinusitis, pneumonia, asthma, or epiglottitis. Blood tests, throat tests, and X-rays are not necessary to diagnose a common cold, but they may sometimes be helpful in  excluding other more serious diseases. Your caregiver will decide if any further tests are required. °RISKS AND COMPLICATIONS  °You may be at risk for a more severe case of the common cold if you smoke cigarettes, have chronic heart disease (such as heart failure) or lung disease (such as asthma), or if you have a weakened immune system. The very young and very old are also at risk for more serious infections. Bacterial sinusitis, middle ear infections, and bacterial pneumonia can complicate the common cold. The common cold can worsen asthma and chronic obstructive pulmonary disease (COPD). Sometimes, these complications can require emergency medical care and may be life-threatening. °PREVENTION  °The best way to protect against getting a cold is to practice good hygiene. Avoid oral or hand contact with people with cold symptoms. Wash your hands often if contact occurs. There is no clear evidence that vitamin C, vitamin E, echinacea, or exercise reduces the chance of developing a cold. However, it is always recommended to get plenty of rest and practice good nutrition. °TREATMENT  °Treatment is directed at relieving symptoms. There is no cure. Antibiotics are not effective, because the infection is caused by a virus, not by bacteria. Treatment may include: °· Increased fluid intake. Sports drinks offer valuable electrolytes, sugars, and fluids. °· Breathing heated mist or steam (vaporizer   or shower). °· Eating chicken soup or other clear broths, and maintaining good nutrition. °· Getting plenty of rest. °· Using gargles or lozenges for comfort. °· Controlling fevers with ibuprofen or acetaminophen as directed by your caregiver. °· Increasing usage of your inhaler if you have asthma. °Zinc gel and zinc lozenges, taken in the first 24 hours of the common cold, can shorten the duration and lessen the severity of symptoms. Pain medicines may help with fever, muscle aches, and throat pain. A variety of non-prescription  medicines are available to treat congestion and runny nose. Your caregiver can make recommendations and may suggest nasal or lung inhalers for other symptoms.  °HOME CARE INSTRUCTIONS  °· Only take over-the-counter or prescription medicines for pain, discomfort, or fever as directed by your caregiver. °· Use a warm mist humidifier or inhale steam from a shower to increase air moisture. This may keep secretions moist and make it easier to breathe. °· Drink enough water and fluids to keep your urine clear or pale yellow. °· Rest as needed. °· Return to work when your temperature has returned to normal or as your caregiver advises. You may need to stay home longer to avoid infecting others. You can also use a face mask and careful hand washing to prevent spread of the virus. °SEEK MEDICAL CARE IF:  °· After the first few days, you feel you are getting worse rather than better. °· You need your caregiver's advice about medicines to control symptoms. °· You develop chills, worsening shortness of breath, or brown or red sputum. These may be signs of pneumonia. °· You develop yellow or brown nasal discharge or pain in the face, especially when you bend forward. These may be signs of sinusitis. °· You develop a fever, swollen neck glands, pain with swallowing, or white areas in the back of your throat. These may be signs of strep throat. °SEEK IMMEDIATE MEDICAL CARE IF:  °· You have a fever. °· You develop severe or persistent headache, ear pain, sinus pain, or chest pain. °· You develop wheezing, a prolonged cough, cough up blood, or have a change in your usual mucus (if you have chronic lung disease). °· You develop sore muscles or a stiff neck. °Document Released: 02/22/2001 Document Revised: 11/21/2011 Document Reviewed: 12/04/2013 °ExitCare® Patient Information ©2015 [employer]. This information is not intended to replace advice given to you by your health care provider. Make sure you discuss any questions you have  with your health care provider. ° °

## 2014-07-25 ENCOUNTER — Emergency Department (INDEPENDENT_AMBULATORY_CARE_PROVIDER_SITE_OTHER)
Admission: EM | Admit: 2014-07-25 | Discharge: 2014-07-25 | Disposition: A | Discharge status: Home / Self Care | Payer: Medicare Other | Source: Home / Self Care | Attending: Family Medicine | Admitting: Family Medicine

## 2014-07-25 ENCOUNTER — Encounter (HOSPITAL_COMMUNITY): Payer: Self-pay | Admitting: Emergency Medicine

## 2014-07-25 DIAGNOSIS — J0100 Acute maxillary sinusitis, unspecified: Secondary | ICD-10-CM

## 2014-07-25 DIAGNOSIS — J069 Acute upper respiratory infection, unspecified: Secondary | ICD-10-CM | POA: Diagnosis not present

## 2014-07-25 DIAGNOSIS — J45901 Unspecified asthma with (acute) exacerbation: Secondary | ICD-10-CM | POA: Diagnosis not present

## 2014-07-25 MED ORDER — IPRATROPIUM BROMIDE 0.06 % NA SOLN: 2.0000 | Freq: Four times a day (QID) | NASAL | Status: DC

## 2014-07-25 MED ORDER — DOXYCYCLINE HYCLATE 100 MG PO TABS: 100.0000 mg | ORAL_TABLET | Freq: Two times a day (BID) | ORAL | Status: DC

## 2014-07-25 NOTE — ED Notes (Signed)
Pt has been suffering from a cough since Monday.  She states she took Robittusin last night and had a horrible cough after that that "took her breath away" and exacerbated her asthma.  States her sinuses are hurting as well.

## 2014-07-25 NOTE — Discharge Instructions (Signed)
Most of your symptoms are due to post nasal drip caused by a viral infection This will likely clear in the next few days Please start using your flonase at night before bed Please also consider using the nasal atrovent to help dry up the secretions Please use the doxy if you do not get better Your sinus infection will likely improve with the above interventions  The post nasal drip is also affecting your asthma Please start using your albuterol every 4 hours for the next 24-48 hours then as needed after that.  Please come back if you do not improve.

## 2014-07-25 NOTE — ED Provider Notes (Signed)
CSN: [id number]     Arrival date & time 07/25/14  1128 History   First MD Initiated Contact with Patient 07/25/14 1206     Chief Complaint  Patient presents with  . chest congestion    (Consider location/radiation/quality/duration/timing/severity/associated sxs/prior Treatment) HPI  Started w/ cough 5 days ago. Cough drops w/ some benefit. Slowly getting worse. Robitussin last night w/o benefit. Now coughing up yellow material. Now w/ raw throat and R ear pain. Deneis fevers, CP, sob, palpitaitons. Now w/ sinus pain and pressure.   Asthma: using advair. Has not needed albuterol   Past Medical History  Diagnosis Date  . Myocardial infarct 2005  . CAD (coronary artery disease)   . Hypertension   . Long term (current) use of anticoagulants   . Edema   . Hematoma   . Sinusitis acute   . Adenocarcinoma of breast     right  . Acute upper respiratory infections of unspecified site   . Depression   . Anal fissure   . GERD (gastroesophageal reflux disease)   . Dog bite(E906.0)   . Diverticulosis of colon (without mention of hemorrhage)   . Spinal stenosis, lumbar region, without neurogenic claudication   . Anxiety   . Panic attacks   . Asthma   . Irritable bowel syndrome   . Hiatal hernia   . Jaundice     Hx of Jaundice at age 60 from "dirty restuarant". Unsure of Hepatitis type   Past Surgical History  Procedure Laterality Date  . Partial hysterectomy  1987  . Cardiac catheterization  2001  . Coronary angioplasty with stent placement  2001  . Cholecystectomy    . Bladder repair      tact  . Breast lumpectomy Right   . Breast reconstruction Right   . Breast reduction surgery Left   . Colonoscopy  2013    Diverticulosis  . Cataract extraction    . Esophageal manometry  10/08/2012    Procedure: ESOPHAGEAL MANOMETRY (EM);  Surgeon: Sable Feil, MD;  Location: [facility];  Service: Endoscopy;  Laterality: N/A;  . Esophagogastroduodenoscopy  2014    Normal     Family History  Problem Relation Age of Onset  . Hypertension Mother   . Heart disease Mother   . Dementia Mother   . Arthritis Mother   . Diabetes Mother   . Prostate cancer Father   . Lung cancer Maternal Uncle   . Hypertension Father   . Hypertension Sister   . Hypertension Brother   . Heart disease Sister   . Rectal cancer Neg Hx   . Stomach cancer Neg Hx   . Colon cancer Cousin   . Colon polyps Mother   . Colon polyps Father   . Inflammatory bowel disease Sister    History  Substance Use Topics  . Smoking status: Former Smoker -- 0.30 packs/day for 8 years    Types: Cigarettes    Quit date: 09/13/1983  . Smokeless tobacco: Never Used  . Alcohol Use: No   OB History    No data available     Review of Systems Per HPI with all other pertinent systems negative.   Allergies  Amoxicillin; Azithromycin; Bromfed; Cephalexin; Chlordiazepoxide-clidinium; Clotrimazole; Dicyclomine hcl; Gatifloxacin; Hydralazine hcl; Ibuprofen; Iohexol; Latex; Lidocaine; Lisinopril; Librax ; Paroxetine; Penicillins; Prednisone; Pregabalin; Propoxyphene n-acetaminophen; Sertraline; Sertraline hcl; Sulfa antibiotics; Sulfadiazine; and Verapamil  Home Medications   Prior to Admission medications   Medication Sig Start Date End Date Taking? Authorizing Provider  ALPRAZolam (XANAX) 1 MG tablet Take 0.5-1 tablets (0.5-1 mg total) by mouth 2 (two) times daily as needed for anxiety or sleep. 07/14/14  Yes Tresa Garter, MD  ascorbic acid (VITAMIN C) 1000 MG tablet Take 1,000 mg by mouth daily.   Yes Historical Provider, MD  aspirin 81 MG tablet Take 81 mg by mouth daily.     Yes Historical Provider, MD  Calcium Carbonate-Vitamin D (CALCIUM-CARB 600 + D) 600-125 MG-UNIT TABS Take 1 capsule by mouth daily.    Yes Historical Provider, MD  cycloSPORINE (RESTASIS) 0.05 % ophthalmic emulsion Place 1 drop into both eyes 2 (two) times daily.     Yes Historical Provider, MD  fluticasone (FLONASE) 50  MCG/ACT nasal spray Place 2 sprays into both nostrils daily. 05/12/14  Yes Tresa Garter, MD  Fluticasone-Salmeterol (ADVAIR) 250-50 MCG/DOSE AEPB Inhale 1 puff into the lungs 2 (two) times daily. 03/24/14  Yes Tresa Garter, MD  gabapentin (NEURONTIN) 300 MG capsule Take 1 capsule (300 mg total) by mouth at bedtime. Take 1 cap at bedtime then 2 caps times 7 days then 3 caps qhs 08/19/13  Yes Janece Canterbury, MD  hydrOXYzine (VISTARIL) 100 MG capsule Take 1 capsule (100 mg total) by mouth 3 (three) times daily as needed for itching (allergies). 11/26/13  Yes Theodis Blaze, MD  Ipratropium-Albuterol (COMBIVENT) 20-100 MCG/ACT AERS respimat Inhale 1 puff into the lungs every 6 (six) hours as needed for wheezing or shortness of breath. 12/23/13  Yes Tresa Garter, MD  metoprolol (LOPRESSOR) 50 MG tablet Take 1 tablet (50 mg total) by mouth daily. 07/14/14  Yes Tresa Garter, MD  pantoprazole (PROTONIX) 40 MG tablet Take 1 tablet (40 mg total) by mouth daily. 07/14/14  Yes Tresa Garter, MD  pravastatin (PRAVACHOL) 40 MG tablet Take 1 tablet (40 mg total) by mouth every evening. 07/14/14  Yes Tresa Garter, MD  valsartan (DIOVAN) 320 MG tablet Take in am daily 07/14/14  Yes Olugbemiga E Doreene Burke, MD  vitamin E (VITAMIN E) 400 UNIT capsule Take 400 Units by mouth daily.   Yes Historical Provider, MD  VOLTAREN 1 % GEL Apply 2 g topically 2 (two) times daily as needed (pain).  06/30/13  Yes Historical Provider, MD  doxycycline (VIBRA-TABS) 100 MG tablet Take 1 tablet (100 mg total) by mouth 2 (two) times daily. 07/25/14   Waldemar Dickens, MD  ipratropium (ATROVENT) 0.06 % nasal spray Place 2 sprays into both nostrils 4 (four) times daily. 07/25/14   Waldemar Dickens, MD   BP 145/96 mmHg  Pulse 82  Temp(Src) 98.7 F (37.1 C) (Oral)  Resp 16  SpO2 97% Physical Exam  Constitutional: She is oriented to person, place, and time. She appears well-developed and well-nourished. No  distress.  HENT:  Maxillary ttp R>L  Pharyngeal cobblestoning. Intermittent nasal congestion.    Eyes: EOM are normal. Pupils are equal, round, and reactive to light.  Neck: Normal range of motion.  Cardiovascular: Normal rate, normal heart sounds and intact distal pulses.  Exam reveals no gallop.   No murmur heard. Pulmonary/Chest: Effort normal.  Mild intermittent end expiratory wheezing. No crackles, ronchi or diminished breath sounds Effort nml  Abdominal: Soft. Bowel sounds are normal.  Musculoskeletal: Normal range of motion.  Neurological: She is alert and oriented to person, place, and time.  Skin: Skin is warm. She is not diaphoretic.  Psychiatric: She has a normal mood and affect. Her behavior is normal. Judgment and  thought content normal.    ED Course  Procedures (including critical care time) Labs Review Labs Reviewed - No data to display  Imaging Review No results found.   MDM   1. Upper respiratory infection   2. Acute maxillary sinusitis, recurrence not specified   3. Asthma exacerbation    Most of symtpoms likely caused from post nasal drip from a viral infection.  Sonme concern for a bacterial sinus infection Pt to start flonase, intranasal atrovent, continue allergy pill, to see if her symptoms improve.  Start ABX if develops fevers or dose not improve (Doxycycline is the only one that works for pt that she is not allergic too, per pts discussion w/ Dr. Doreene Burke) Asthma exacerbation is mild. Start albuterol Q4 hrs for 24-48hrs. Allergic to steroids???  Precautions given and all questions answered  Linna Darner, MD Family Medicine 07/25/2014, 12:33 PM    Waldemar Dickens, MD 07/25/14 1233

## 2014-07-27 ENCOUNTER — Emergency Department (HOSPITAL_COMMUNITY): Payer: Medicare Other

## 2014-07-27 ENCOUNTER — Emergency Department (HOSPITAL_COMMUNITY)
Admission: EM | Admit: 2014-07-27 | Discharge: 2014-07-27 | Disposition: A | Discharge status: Home / Self Care | Payer: Medicare Other | Attending: Emergency Medicine | Admitting: Emergency Medicine

## 2014-07-27 DIAGNOSIS — Z853 Personal history of malignant neoplasm of breast: Secondary | ICD-10-CM | POA: Insufficient documentation

## 2014-07-27 DIAGNOSIS — Z91040 Latex allergy status: Secondary | ICD-10-CM | POA: Insufficient documentation

## 2014-07-27 DIAGNOSIS — Z880 Allergy status to penicillin: Secondary | ICD-10-CM | POA: Insufficient documentation

## 2014-07-27 DIAGNOSIS — Z79899 Other long term (current) drug therapy: Secondary | ICD-10-CM | POA: Diagnosis not present

## 2014-07-27 DIAGNOSIS — Z9889 Other specified postprocedural states: Secondary | ICD-10-CM | POA: Insufficient documentation

## 2014-07-27 DIAGNOSIS — Z7901 Long term (current) use of anticoagulants: Secondary | ICD-10-CM | POA: Insufficient documentation

## 2014-07-27 DIAGNOSIS — Z7951 Long term (current) use of inhaled steroids: Secondary | ICD-10-CM | POA: Diagnosis not present

## 2014-07-27 DIAGNOSIS — J40 Bronchitis, not specified as acute or chronic: Secondary | ICD-10-CM | POA: Diagnosis not present

## 2014-07-27 DIAGNOSIS — Z9861 Coronary angioplasty status: Secondary | ICD-10-CM | POA: Insufficient documentation

## 2014-07-27 DIAGNOSIS — Z87828 Personal history of other (healed) physical injury and trauma: Secondary | ICD-10-CM | POA: Diagnosis not present

## 2014-07-27 DIAGNOSIS — Z87891 Personal history of nicotine dependence: Secondary | ICD-10-CM | POA: Insufficient documentation

## 2014-07-27 DIAGNOSIS — F329 Major depressive disorder, single episode, unspecified: Secondary | ICD-10-CM | POA: Insufficient documentation

## 2014-07-27 DIAGNOSIS — Z7982 Long term (current) use of aspirin: Secondary | ICD-10-CM | POA: Insufficient documentation

## 2014-07-27 DIAGNOSIS — I252 Old myocardial infarction: Secondary | ICD-10-CM | POA: Insufficient documentation

## 2014-07-27 DIAGNOSIS — J45901 Unspecified asthma with (acute) exacerbation: Secondary | ICD-10-CM | POA: Insufficient documentation

## 2014-07-27 DIAGNOSIS — F419 Anxiety disorder, unspecified: Secondary | ICD-10-CM | POA: Diagnosis not present

## 2014-07-27 DIAGNOSIS — K219 Gastro-esophageal reflux disease without esophagitis: Secondary | ICD-10-CM | POA: Diagnosis not present

## 2014-07-27 DIAGNOSIS — I10 Essential (primary) hypertension: Secondary | ICD-10-CM | POA: Diagnosis not present

## 2014-07-27 DIAGNOSIS — I2510 Atherosclerotic heart disease of native coronary artery without angina pectoris: Secondary | ICD-10-CM | POA: Diagnosis not present

## 2014-07-27 DIAGNOSIS — R059 Cough, unspecified: Secondary | ICD-10-CM

## 2014-07-27 DIAGNOSIS — R05 Cough: Secondary | ICD-10-CM | POA: Diagnosis not present

## 2014-07-27 MED ORDER — HYDROCOD POLST-CHLORPHEN POLST 10-8 MG/5ML PO LQCR: 5.0000 mL | Freq: Two times a day (BID) | ORAL | Status: DC | PRN

## 2014-07-27 MED ORDER — ALBUTEROL SULFATE HFA 108 (90 BASE) MCG/ACT IN AERS: 2.0000 | INHALATION_SPRAY | RESPIRATORY_TRACT | Status: DC | PRN

## 2014-07-27 NOTE — ED Provider Notes (Signed)
CSN: [id number]     Arrival date & time 07/27/14  3710 History   First MD Initiated Contact with Patient 07/27/14 [phone]     Chief Complaint  Patient presents with  . Cough     (Consider location/radiation/quality/duration/timing/severity/associated sxs/prior Treatment) HPI Comments: 63 year old female, history of asthma after receiving medication for her breast cancer, has had approximately 7 days of a cough and nasal congestion. Initially her symptoms started with a cough productive of yellow sputum, nasal congestion and drainage, she was seen at the urgent care recently and treated with doxycycline and told to use her albuterol inhaler every 4 hours as needed, she has been stable until this morning when she felt like her symptoms got worse. This involves increased cough, change in production of sputum from yellow to slightly green, aching of the ribs on the right and left predominantly when she lays down and coughs. She denies any swelling of the lower extremities, denies fevers chills nausea or vomiting.  Patient is a 63 y.o. female presenting with cough. The history is provided by the patient.  Cough   Past Medical History  Diagnosis Date  . Myocardial infarct 2005  . CAD (coronary artery disease)   . Hypertension   . Long term (current) use of anticoagulants   . Edema   . Hematoma   . Sinusitis acute   . Adenocarcinoma of breast     right  . Acute upper respiratory infections of unspecified site   . Depression   . Anal fissure   . GERD (gastroesophageal reflux disease)   . Dog bite(E906.0)   . Diverticulosis of colon (without mention of hemorrhage)   . Spinal stenosis, lumbar region, without neurogenic claudication   . Anxiety   . Panic attacks   . Asthma   . Irritable bowel syndrome   . Hiatal hernia   . Jaundice     Hx of Jaundice at age 63 from "dirty restuarant". Unsure of Hepatitis type   Past Surgical History  Procedure Laterality Date  . Partial hysterectomy   1987  . Cardiac catheterization  2001  . Coronary angioplasty with stent placement  2001  . Cholecystectomy    . Bladder repair      tact  . Breast lumpectomy Right   . Breast reconstruction Right   . Breast reduction surgery Left   . Colonoscopy  2013    Diverticulosis  . Cataract extraction    . Esophageal manometry  10/08/2012    Procedure: ESOPHAGEAL MANOMETRY (EM);  Surgeon: Sable Feil, MD;  Location: [facility];  Service: Endoscopy;  Laterality: N/A;  . Esophagogastroduodenoscopy  2014    Normal    Family History  Problem Relation Age of Onset  . Hypertension Mother   . Heart disease Mother   . Dementia Mother   . Arthritis Mother   . Diabetes Mother   . Prostate cancer Father   . Lung cancer Maternal Uncle   . Hypertension Father   . Hypertension Sister   . Hypertension Brother   . Heart disease Sister   . Rectal cancer Neg Hx   . Stomach cancer Neg Hx   . Colon cancer Cousin   . Colon polyps Mother   . Colon polyps Father   . Inflammatory bowel disease Sister    History  Substance Use Topics  . Smoking status: Former Smoker -- 0.30 packs/day for 8 years    Types: Cigarettes    Quit date: 09/13/1983  . Smokeless tobacco:  Never Used  . Alcohol Use: No   OB History    No data available     Review of Systems  Respiratory: Positive for cough.   All other systems reviewed and are negative.     Allergies  Amoxicillin; Azithromycin; Bromfed; Cephalexin; Chlordiazepoxide-clidinium; Clotrimazole; Dicyclomine hcl; Gatifloxacin; Hydralazine hcl; Ibuprofen; Iohexol; Latex; Lidocaine; Lisinopril; Librax ; Paroxetine; Penicillins; Prednisone; Pregabalin; Propoxyphene n-acetaminophen; Sertraline; Sertraline hcl; Sulfa antibiotics; Sulfadiazine; and Verapamil  Home Medications   Prior to Admission medications   Medication Sig Start Date End Date Taking? Authorizing Provider  albuterol (PROVENTIL HFA;VENTOLIN HFA) 108 (90 BASE) MCG/ACT inhaler Inhale 2  puffs into the lungs every 4 (four) hours as needed for wheezing or shortness of breath. 07/27/14   Johnna Acosta, MD  ALPRAZolam Duanne Moron) 1 MG tablet Take 0.5-1 tablets (0.5-1 mg total) by mouth 2 (two) times daily as needed for anxiety or sleep. 07/14/14   Tresa Garter, MD  ascorbic acid (VITAMIN C) 1000 MG tablet Take 1,000 mg by mouth daily.    Historical Provider, MD  aspirin 81 MG tablet Take 81 mg by mouth daily.      Historical Provider, MD  Calcium Carbonate-Vitamin D (CALCIUM-CARB 600 + D) 600-125 MG-UNIT TABS Take 1 capsule by mouth daily.     Historical Provider, MD  chlorpheniramine-HYDROcodone (TUSSIONEX PENNKINETIC ER) 10-8 MG/5ML LQCR Take 5 mLs by mouth every 12 (twelve) hours as needed for cough. 07/27/14   Johnna Acosta, MD  cycloSPORINE (RESTASIS) 0.05 % ophthalmic emulsion Place 1 drop into both eyes 2 (two) times daily.      Historical Provider, MD  doxycycline (VIBRA-TABS) 100 MG tablet Take 1 tablet (100 mg total) by mouth 2 (two) times daily. 07/25/14   Waldemar Dickens, MD  fluticasone (FLONASE) 50 MCG/ACT nasal spray Place 2 sprays into both nostrils daily. 05/12/14   Tresa Garter, MD  Fluticasone-Salmeterol (ADVAIR) 250-50 MCG/DOSE AEPB Inhale 1 puff into the lungs 2 (two) times daily. 03/24/14   Tresa Garter, MD  gabapentin (NEURONTIN) 300 MG capsule Take 1 capsule (300 mg total) by mouth at bedtime. Take 1 cap at bedtime then 2 caps times 7 days then 3 caps qhs 08/19/13   Janece Canterbury, MD  hydrOXYzine (VISTARIL) 100 MG capsule Take 1 capsule (100 mg total) by mouth 3 (three) times daily as needed for itching (allergies). 11/26/13   Theodis Blaze, MD  ipratropium (ATROVENT) 0.06 % nasal spray Place 2 sprays into both nostrils 4 (four) times daily. 07/25/14   Waldemar Dickens, MD  Ipratropium-Albuterol (COMBIVENT) 20-100 MCG/ACT AERS respimat Inhale 1 puff into the lungs every 6 (six) hours as needed for wheezing or shortness of breath. 12/23/13   Tresa Garter, MD  metoprolol (LOPRESSOR) 50 MG tablet Take 1 tablet (50 mg total) by mouth daily. 07/14/14   Tresa Garter, MD  pantoprazole (PROTONIX) 40 MG tablet Take 1 tablet (40 mg total) by mouth daily. 07/14/14   Tresa Garter, MD  pravastatin (PRAVACHOL) 40 MG tablet Take 1 tablet (40 mg total) by mouth every evening. 07/14/14   Tresa Garter, MD  valsartan (DIOVAN) 320 MG tablet Take in am daily 07/14/14   Tresa Garter, MD  vitamin E (VITAMIN E) 400 UNIT capsule Take 400 Units by mouth daily.    Historical Provider, MD  VOLTAREN 1 % GEL Apply 2 g topically 2 (two) times daily as needed (pain).  06/30/13   Historical Provider, MD  BP 173/90 mmHg  Pulse 95  Temp(Src) 98.5 F (36.9 C) (Oral)  Resp 20  SpO2 99% Physical Exam  Constitutional: She appears well-developed and well-nourished. No distress.  HENT:  Head: Normocephalic and atraumatic.  Mouth/Throat: Oropharynx is clear and moist. No oropharyngeal exudate.  Tympanic membranes clear bilaterally, no significant swelling of the nasal passages or rhinorrhea, clear oropharynx without drainage posteriorly or changes in mucous membranes which appear moist.  Eyes: Conjunctivae and EOM are normal. Pupils are equal, round, and reactive to light. Right eye exhibits no discharge. Left eye exhibits no discharge. No scleral icterus.  Neck: Normal range of motion. Neck supple. No JVD present. No thyromegaly present.  Very supple neck with no lymphadenopathy  Cardiovascular: Normal rate, regular rhythm, normal heart sounds and intact distal pulses.  Exam reveals no gallop and no friction rub.   No murmur heard. Pulmonary/Chest: Effort normal. No respiratory distress. She has wheezes. She has no rales.  The patient has no increased work of breathing, she is not in respiratory distress, she speaks in full sentences, she does have expiratory wheezing bilaterally, no rales.  Musculoskeletal: Normal range of motion. She exhibits  no edema or tenderness.  Lymphadenopathy:    She has no cervical adenopathy.  Neurological: She is alert. Coordination normal.  Skin: Skin is warm and dry. No rash noted. No erythema.  Psychiatric: She has a normal mood and affect. Her behavior is normal.  Nursing note and vitals reviewed.   ED Course  Procedures (including critical care time) Labs Review Labs Reviewed - No data to display  Imaging Review Dg Chest 2 View  07/27/2014   CLINICAL DATA:  Acute persistent cough for 1 week  EXAM: CHEST - 2 VIEW  COMPARISON:  04/25/2014, 08/18/2013  FINDINGS: Normal heart size and vascularity. Similar streaky opacity along the left cardiac border within the lingula compatible with atelectasis or scarring. No new superimposed pneumonia, collapse or consolidation. No edema, effusion or pneumothorax. Trachea midline. Prior cholecystectomy noted.  IMPRESSION: Persistent lingula atelectasis versus scarring. No interval change or new process.   Electronically Signed   By: Daryll Brod M.D.   On: 07/27/2014 10:15      MDM   Final diagnoses:  Cough  Bronchitis    The patient has vital signs which are unremarkable except for mild hypertension, she will need a chest x-ray to rule out a developing pneumonia or pneumothorax, currently she is on an appropriate antibiotic and bronchodilators, would consider adding antitussives and prednisone, only with change antibiotic if x-ray shows developing pneumonia. The patient is in agreement with this plan.  X-ray is negative for infection, no need to change antibiotic, patient given reassurance, cough medication and refill on albuterol, stable for discharge  Meds given in ED:  Medications - No data to display  New Prescriptions   ALBUTEROL (PROVENTIL HFA;VENTOLIN HFA) 108 (90 BASE) MCG/ACT INHALER    Inhale 2 puffs into the lungs every 4 (four) hours as needed for wheezing or shortness of breath.   CHLORPHENIRAMINE-HYDROCODONE (TUSSIONEX PENNKINETIC ER)  10-8 MG/5ML LQCR    Take 5 mLs by mouth every 12 (twelve) hours as needed for cough.      Johnna Acosta, MD 07/27/14 [phone]

## 2014-07-27 NOTE — ED Notes (Signed)
Pt states cough and congestion x 7 days, productive, yellow green sputum, treated at urgent care 2 d ago with Doxycylcine.  Intermittent fever. Bilateral rib pain.

## 2014-07-27 NOTE — Discharge Instructions (Signed)
The medications that I have prescribed R cough medication and albuterol inhaler. Please be aware that the cough medication may cross react with some of the other medications that you have been allergic to in the past in other words it may cause hives or swelling. If this occurs please stop the medication immediately and seek medical attention. Your x-ray is unchanged from prior x-rays, there is no signs of pneumonia. Please continue to use your albuterol inhaler 2 puffs every 4 hours, ibuprofen for any pain related to your chest which is likely related to inflammation and the cough medication every 12 hours as needed.Please call your doctor for a followup appointment within 24-48 hours. When you talk to your doctor please let them know that you were seen in the emergency department and have them acquire all of your records so that they can discuss the findings with you and formulate a treatment plan to fully care for your new and ongoing problems.

## 2014-07-28 ENCOUNTER — Ambulatory Visit: Payer: Medicare Other | Attending: Internal Medicine | Admitting: *Deleted

## 2014-07-28 DIAGNOSIS — I10 Essential (primary) hypertension: Secondary | ICD-10-CM | POA: Insufficient documentation

## 2014-07-28 NOTE — Progress Notes (Signed)
Patient presents for BP check States seen in ED yesterday for bronchitis Stopped tussionex cough syrup due to itchy rash; added to med allergy list Taking all other meds as directed Given demonstration on correct way to use albuterol inhaler Encouraged patient to use spacer device that she has at home for albuterol Encouraged to rest and drink fluids (water)   BP at last visit with PCP 158/101. Had URI at that time  BP 154/90 left arm manually with adult cuff P 101 R 22 T 99.2 oral SPO2 96%  Patient states that she is making an appt with PCP to f/u after bronchitis Patient instructed to return in 2 weeks for nurse visit for BP check unless able to see PCP sooner.

## 2014-07-29 ENCOUNTER — Telehealth: Payer: Self-pay | Admitting: Internal Medicine

## 2014-07-29 ENCOUNTER — Telehealth: Payer: Self-pay | Admitting: Emergency Medicine

## 2014-07-29 NOTE — Telephone Encounter (Signed)
Pt called in requesting  Alternative cough medication Pt states she had allergic reaction to prescribed medication with itching,rash Informed pt because she has 25 allergies listed, I cant prescribed meds until provider returns to clinic Instructed pt to get some otc Robitussin for relief

## 2014-07-29 NOTE — Telephone Encounter (Signed)
Patient has had an allergic Reax to medication chlorpheniramine-HYDROcodone (TUSSIONEX PENNKINETIC ER) 10-8 MG/5ML LQCR; please call to refill another script

## 2014-08-02 ENCOUNTER — Emergency Department (HOSPITAL_COMMUNITY): Payer: Medicare Other

## 2014-08-02 ENCOUNTER — Emergency Department (HOSPITAL_COMMUNITY)
Admission: EM | Admit: 2014-08-02 | Discharge: 2014-08-02 | Disposition: A | Discharge status: Home / Self Care | Payer: Medicare Other | Attending: Emergency Medicine | Admitting: Emergency Medicine

## 2014-08-02 ENCOUNTER — Encounter (HOSPITAL_COMMUNITY): Payer: Self-pay | Admitting: Emergency Medicine

## 2014-08-02 DIAGNOSIS — J45901 Unspecified asthma with (acute) exacerbation: Secondary | ICD-10-CM | POA: Diagnosis not present

## 2014-08-02 DIAGNOSIS — Z9889 Other specified postprocedural states: Secondary | ICD-10-CM | POA: Diagnosis not present

## 2014-08-02 DIAGNOSIS — Z91040 Latex allergy status: Secondary | ICD-10-CM | POA: Insufficient documentation

## 2014-08-02 DIAGNOSIS — R0789 Other chest pain: Secondary | ICD-10-CM | POA: Diagnosis not present

## 2014-08-02 DIAGNOSIS — Z87891 Personal history of nicotine dependence: Secondary | ICD-10-CM | POA: Insufficient documentation

## 2014-08-02 DIAGNOSIS — I10 Essential (primary) hypertension: Secondary | ICD-10-CM | POA: Diagnosis not present

## 2014-08-02 DIAGNOSIS — Z880 Allergy status to penicillin: Secondary | ICD-10-CM | POA: Insufficient documentation

## 2014-08-02 DIAGNOSIS — Z79899 Other long term (current) drug therapy: Secondary | ICD-10-CM | POA: Diagnosis not present

## 2014-08-02 DIAGNOSIS — J9801 Acute bronchospasm: Secondary | ICD-10-CM

## 2014-08-02 DIAGNOSIS — F329 Major depressive disorder, single episode, unspecified: Secondary | ICD-10-CM | POA: Diagnosis not present

## 2014-08-02 DIAGNOSIS — K219 Gastro-esophageal reflux disease without esophagitis: Secondary | ICD-10-CM | POA: Insufficient documentation

## 2014-08-02 DIAGNOSIS — Z8739 Personal history of other diseases of the musculoskeletal system and connective tissue: Secondary | ICD-10-CM | POA: Diagnosis not present

## 2014-08-02 DIAGNOSIS — I252 Old myocardial infarction: Secondary | ICD-10-CM | POA: Diagnosis not present

## 2014-08-02 DIAGNOSIS — Z9861 Coronary angioplasty status: Secondary | ICD-10-CM | POA: Insufficient documentation

## 2014-08-02 DIAGNOSIS — F410 Panic disorder [episodic paroxysmal anxiety] without agoraphobia: Secondary | ICD-10-CM | POA: Insufficient documentation

## 2014-08-02 DIAGNOSIS — R0602 Shortness of breath: Secondary | ICD-10-CM | POA: Diagnosis not present

## 2014-08-02 DIAGNOSIS — I2510 Atherosclerotic heart disease of native coronary artery without angina pectoris: Secondary | ICD-10-CM | POA: Insufficient documentation

## 2014-08-02 DIAGNOSIS — Z7982 Long term (current) use of aspirin: Secondary | ICD-10-CM | POA: Diagnosis not present

## 2014-08-02 LAB — CBC WITH DIFFERENTIAL/PLATELET
BASOS ABS: 0 10*3/uL (ref 0.0–0.1)
Basophils Relative: 0 % (ref 0–1)
Eosinophils Absolute: 0 10*3/uL (ref 0.0–0.7)
Eosinophils Relative: 1 % (ref 0–5)
HEMATOCRIT: 42.5 % (ref 36.0–46.0)
Hemoglobin: 14.3 g/dL (ref 12.0–15.0)
LYMPHS PCT: 38 % (ref 12–46)
Lymphs Abs: 1.7 10*3/uL (ref 0.7–4.0)
MCH: 30.2 pg (ref 26.0–34.0)
MCHC: 33.6 g/dL (ref 30.0–36.0)
MCV: 89.7 fL (ref 78.0–100.0)
MONO ABS: 0.3 10*3/uL (ref 0.1–1.0)
Monocytes Relative: 7 % (ref 3–12)
NEUTROS ABS: 2.4 10*3/uL (ref 1.7–7.7)
NEUTROS PCT: 54 % (ref 43–77)
Platelets: 220 10*3/uL (ref 150–400)
RBC: 4.74 MIL/uL (ref 3.87–5.11)
RDW: 13.1 % (ref 11.5–15.5)
WBC: 4.5 10*3/uL (ref 4.0–10.5)

## 2014-08-02 LAB — BASIC METABOLIC PANEL
ANION GAP: 15 (ref 5–15)
BUN: 13 mg/dL (ref 6–23)
CHLORIDE: 102 meq/L (ref 96–112)
CO2: 25 meq/L (ref 19–32)
CREATININE: 0.75 mg/dL (ref 0.50–1.10)
Calcium: 10.9 mg/dL — ABNORMAL HIGH (ref 8.4–10.5)
GFR calc Af Amer: >90 mL/min (ref >90)
GFR calc non Af Amer: 90 mL/min — ABNORMAL LOW (ref >90)
Glucose, Bld: 103 mg/dL — ABNORMAL HIGH (ref 70–99)
POTASSIUM: 3.5 meq/L — AB (ref 3.7–5.3)
Sodium: 142 mEq/L (ref 137–147)

## 2014-08-02 LAB — PRO B NATRIURETIC PEPTIDE: PRO B NATRI PEPTIDE: 93.2 pg/mL (ref 0–125)

## 2014-08-02 LAB — TROPONIN I

## 2014-08-02 MED ORDER — METHYLPREDNISOLONE 4 MG PO KIT: PACK | ORAL | Status: DC

## 2014-08-02 MED ORDER — PREDNISONE 20 MG PO TABS: 20.0000 mg | ORAL_TABLET | Freq: Every day | ORAL | Status: DC

## 2014-08-02 MED ORDER — LORAZEPAM 2 MG/ML IJ SOLN: 1.0000 mg | INTRAMUSCULAR | Status: DC | PRN

## 2014-08-02 MED ORDER — BENZONATATE 100 MG PO CAPS: 100.0000 mg | ORAL_CAPSULE | Freq: Three times a day (TID) | ORAL | Status: DC

## 2014-08-02 MED ORDER — ALBUTEROL SULFATE (2.5 MG/3ML) 0.083% IN NEBU
2.5000 mg | INHALATION_SOLUTION | RESPIRATORY_TRACT | Status: DC | PRN
Start: 2014-08-02 — End: 2014-08-03
  Administered 2014-08-02 (×2): 2.5 mg via RESPIRATORY_TRACT
  Filled 2014-08-02 (×2): qty 3

## 2014-08-02 MED ORDER — METHYLPREDNISOLONE SODIUM SUCC 125 MG IJ SOLR
125.0000 mg | Freq: Once | INTRAMUSCULAR | Status: AC
  Administered 2014-08-02: 125 mg via INTRAVENOUS
  Filled 2014-08-02: qty 2

## 2014-08-02 NOTE — ED Provider Notes (Signed)
CSN: [id number]     Arrival date & time 08/02/14  1744 History   First MD Initiated Contact with Patient 08/02/14 1815     Chief Complaint  Patient presents with  . Shortness of Breath      HPI  Emergency room evaluation of difficulty breathing. States she's had a week with shortness of breath. Frequent cough. Feels like she is "wheezing" has no albuterol inhaler. Has been using it "some" minimal relief. He continues with cough. Given doxycycline by urgent care a few days ago. Continues with cough and shortness of breath.  Past Medical History  Diagnosis Date  . Myocardial infarct 2005  . CAD (coronary artery disease)   . Hypertension   . Long term (current) use of anticoagulants   . Edema   . Hematoma   . Sinusitis acute   . Adenocarcinoma of breast     right  . Acute upper respiratory infections of unspecified site   . Depression   . Anal fissure   . GERD (gastroesophageal reflux disease)   . Dog bite(E906.0)   . Diverticulosis of colon (without mention of hemorrhage)   . Spinal stenosis, lumbar region, without neurogenic claudication   . Anxiety   . Panic attacks   . Asthma   . Irritable bowel syndrome   . Hiatal hernia   . Jaundice     Hx of Jaundice at age 19 from "dirty restuarant". Unsure of Hepatitis type   Past Surgical History  Procedure Laterality Date  . Partial hysterectomy  1987  . Cardiac catheterization  2001  . Coronary angioplasty with stent placement  2001  . Cholecystectomy    . Bladder repair      tact  . Breast lumpectomy Right   . Breast reconstruction Right   . Breast reduction surgery Left   . Colonoscopy  2013    Diverticulosis  . Cataract extraction    . Esophageal manometry  10/08/2012    Procedure: ESOPHAGEAL MANOMETRY (EM);  Surgeon: Sable Feil, MD;  Location: [facility];  Service: Endoscopy;  Laterality: N/A;  . Esophagogastroduodenoscopy  2014    Normal    Family History  Problem Relation Age of Onset  . Hypertension  Mother   . Heart disease Mother   . Dementia Mother   . Arthritis Mother   . Diabetes Mother   . Colon polyps Mother   . Prostate cancer Father   . Hypertension Father   . Colon polyps Father   . Lung cancer Maternal Uncle   . Hypertension Sister   . Hypertension Brother   . Heart disease Sister   . Rectal cancer Neg Hx   . Stomach cancer Neg Hx   . Colon cancer Cousin   . Inflammatory bowel disease Sister    History  Substance Use Topics  . Smoking status: Former Smoker -- 0.30 packs/day for 8 years    Types: Cigarettes    Quit date: 09/13/1983  . Smokeless tobacco: Never Used  . Alcohol Use: No   OB History    No data available     Review of Systems  Constitutional: Negative for fever, chills, diaphoresis, appetite change and fatigue.  HENT: Negative for mouth sores, sore throat and trouble swallowing.   Eyes: Negative for visual disturbance.  Respiratory: Positive for cough and wheezing. Negative for chest tightness and shortness of breath.   Cardiovascular: Negative for chest pain.  Gastrointestinal: Negative for nausea, vomiting, abdominal pain, diarrhea and abdominal distention.  Endocrine:  Negative for polydipsia, polyphagia and polyuria.  Genitourinary: Negative for dysuria, frequency and hematuria.  Musculoskeletal: Negative for gait problem.  Skin: Negative for color change, pallor and rash.  Neurological: Negative for dizziness, syncope, light-headedness and headaches.  Hematological: Does not bruise/bleed easily.  Psychiatric/Behavioral: Negative for behavioral problems and confusion.      Allergies  Amoxicillin; Azithromycin; Bromfed; Cephalexin; Chlordiazepoxide-clidinium; Clotrimazole; Dicyclomine hcl; Gatifloxacin; Hydralazine hcl; Ibuprofen; Iohexol; Latex; Lidocaine; Lisinopril; Librax ; Paroxetine; Penicillins; Prednisone; Pregabalin; Propoxyphene n-acetaminophen; Sertraline; Sertraline hcl; Sulfa antibiotics; Sulfadiazine; Tussionex pennkinetic er;  and Verapamil  Home Medications   Prior to Admission medications   Medication Sig Start Date End Date Taking? Authorizing Provider  albuterol (PROVENTIL HFA;VENTOLIN HFA) 108 (90 BASE) MCG/ACT inhaler Inhale 2 puffs into the lungs every 4 (four) hours as needed for wheezing or shortness of breath. 07/27/14  Yes Johnna Acosta, MD  ALPRAZolam Duanne Moron) 1 MG tablet Take 0.5-1 tablets (0.5-1 mg total) by mouth 2 (two) times daily as needed for anxiety or sleep. 07/14/14  Yes Tresa Garter, MD  ascorbic acid (VITAMIN C) 1000 MG tablet Take 1,000 mg by mouth daily.   Yes Historical Provider, MD  aspirin EC 81 MG tablet Take 81 mg by mouth daily.   Yes Historical Provider, MD  Calcium Carbonate-Vitamin D (CALCIUM-CARB 600 + D) 600-125 MG-UNIT TABS Take 1 capsule by mouth daily.    Yes Historical Provider, MD  cetirizine (ZYRTEC) 10 MG tablet Take 10 mg by mouth daily.   Yes Historical Provider, MD  cycloSPORINE (RESTASIS) 0.05 % ophthalmic emulsion Place 1 drop into both eyes 2 (two) times daily.     Yes Historical Provider, MD  doxycycline (VIBRA-TABS) 100 MG tablet Take 1 tablet (100 mg total) by mouth 2 (two) times daily. 07/25/14  Yes Waldemar Dickens, MD  Fluticasone-Salmeterol (ADVAIR) 250-50 MCG/DOSE AEPB Inhale 1 puff into the lungs 2 (two) times daily. 03/24/14  Yes Tresa Garter, MD  gabapentin (NEURONTIN) 300 MG capsule Take 1 capsule (300 mg total) by mouth at bedtime. Take 1 cap at bedtime then 2 caps times 7 days then 3 caps qhs 08/19/13  Yes Janece Canterbury, MD  Ipratropium-Albuterol (COMBIVENT) 20-100 MCG/ACT AERS respimat Inhale 1 puff into the lungs every 6 (six) hours as needed for wheezing or shortness of breath. 12/23/13  Yes Tresa Garter, MD  metoprolol (LOPRESSOR) 50 MG tablet Take 1 tablet (50 mg total) by mouth daily. 07/14/14  Yes Tresa Garter, MD  pantoprazole (PROTONIX) 40 MG tablet Take 1 tablet (40 mg total) by mouth daily. 07/14/14  Yes Tresa Garter,  MD  pravastatin (PRAVACHOL) 40 MG tablet Take 1 tablet (40 mg total) by mouth every evening. 07/14/14  Yes Tresa Garter, MD  vitamin E (VITAMIN E) 400 UNIT capsule Take 400 Units by mouth daily.   Yes Historical Provider, MD  VOLTAREN 1 % GEL Apply 2 g topically 2 (two) times daily as needed (pain).  06/30/13  Yes Historical Provider, MD  benzonatate (TESSALON) 100 MG capsule Take 1 capsule (100 mg total) by mouth every 8 (eight) hours. 08/02/14   Tanna Furry, MD  chlorpheniramine-HYDROcodone [facility] PENNKINETIC ER) 10-8 MG/5ML LQCR Take 5 mLs by mouth every 12 (twelve) hours as needed for cough. 07/27/14   Johnna Acosta, MD  methylPREDNISolone (MEDROL DOSEPAK) 4 MG tablet X 6 days as directed 08/02/14   Tanna Furry, MD  predniSONE (DELTASONE) 20 MG tablet Take 1 tablet (20 mg total) by mouth daily with  breakfast. 08/02/14   Tanna Furry, MD   BP 166/87 mmHg  Pulse 87  Temp(Src) 98 F (36.7 C) (Oral)  Resp 21  Ht 5\' 3"  (1.6 m)  Wt 152 lb (68.947 kg)  BMI 26.93 kg/m2  SpO2 100% Physical Exam  Constitutional: She is oriented to person, place, and time. She appears well-developed and well-nourished. No distress.  HENT:  Head: Normocephalic.  Eyes: Conjunctivae are normal. Pupils are equal, round, and reactive to light. No scleral icterus.  Neck: Normal range of motion. Neck supple. No thyromegaly present.  Cardiovascular: Normal rate and regular rhythm.  Exam reveals no gallop and no friction rub.   No murmur heard. Pulmonary/Chest: Effort normal. No respiratory distress. She has wheezes in the right upper field, the right middle field, the right lower field, the left upper field, the left middle field and the left lower field. She has no rales.  Abdominal: Soft. Bowel sounds are normal. She exhibits no distension. There is no tenderness. There is no rebound.  Musculoskeletal: Normal range of motion.  Neurological: She is alert and oriented to person, place, and time.  Skin: Skin is  warm and dry. No rash noted.  Psychiatric: She has a normal mood and affect. Her behavior is normal.    ED Course  Procedures (including critical care time) Labs Review Labs Reviewed  BASIC METABOLIC PANEL - Abnormal; Notable for the following:    Potassium 3.5 (*)    Glucose, Bld 103 (*)    Calcium 10.9 (*)    GFR calc non Af Amer 90 (*)    All other components within normal limits  CBC WITH DIFFERENTIAL  TROPONIN I  PRO B NATRIURETIC PEPTIDE    Imaging Review Dg Chest 2 View  08/02/2014   CLINICAL DATA:  Shortness of breath for 2 days. Midline chest pain. Left arm numbness. History of breast cancer.  EXAM: CHEST  2 VIEW  COMPARISON:  07/27/2014  FINDINGS: Heart and mediastinal contours are within normal limits. No focal opacities or effusions. No acute bony abnormality.  IMPRESSION: No active cardiopulmonary disease.   Electronically Signed   By: Rolm Baptise M.D.   On: 08/02/2014 19:27     EKG Interpretation   Date/Time:  Saturday August 02 2014 18:05:37 EST Ventricular Rate:  84 PR Interval:  165 QRS Duration: 82 QT Interval:  361 QTC Calculation: 427 R Axis:   53 Text Interpretation:  Sinus rhythm Baseline wander in lead(s) III  Confirmed by Jeneen Rinks  MD, [place] ([id number]) on 08/02/2014 6:31:16 PM      MDM   Final diagnoses:  SOB (shortness of breath)  Bronchospasm   Wheezing resolved after second albuterol nebulizer treatment.  Clinically and radiographically not in congestive heart failure. Not hypoxemic or febrile. No clinical signs of pneumonia with tachypnea or abnormal breath sounds. Normal radiograph. Improved after proper dilators. Given IV Solu-Medrol. Plan is home, continue bronchodilator with albuterol, prednisone, cough suppressants, primary care follow-up.    Tanna Furry, MD 08/03/14 [phone]

## 2014-08-02 NOTE — ED Notes (Signed)
Patient transported to X-ray 

## 2014-08-02 NOTE — Discharge Instructions (Signed)
Asthma Asthma is a recurring condition in which the airways tighten and narrow. Asthma can make it difficult to breathe. It can cause coughing, wheezing, and shortness of breath. Asthma episodes, also called asthma attacks, range from minor to life-threatening. Asthma cannot be cured, but medicines and lifestyle changes can help control it. CAUSES Asthma is believed to be caused by inherited (genetic) and environmental factors, but its exact cause is unknown. Asthma may be triggered by allergens, lung infections, or irritants in the air. Asthma triggers are different for each person. Common triggers include:   Animal dander.  Dust mites.  Cockroaches.  Pollen from trees or grass.  Mold.  Smoke.  Air pollutants such as dust, household cleaners, hair sprays, aerosol sprays, paint fumes, strong chemicals, or strong odors.  Cold air, weather changes, and winds (which increase molds and pollens in the air).  Strong emotional expressions such as crying or laughing hard.  Stress.  Certain medicines (such as aspirin) or types of drugs (such as beta-blockers).  Sulfites in foods and drinks. Foods and drinks that may contain sulfites include dried fruit, potato chips, and sparkling grape juice.  Infections or inflammatory conditions such as the flu, a cold, or an inflammation of the nasal membranes (rhinitis).  Gastroesophageal reflux disease (GERD).  Exercise or strenuous activity. SYMPTOMS Symptoms may occur immediately after asthma is triggered or many hours later. Symptoms include:  Wheezing.  Excessive nighttime or early morning coughing.  Frequent or severe coughing with a common cold.  Chest tightness.  Shortness of breath. DIAGNOSIS  The diagnosis of asthma is made by a review of your medical history and a physical exam. Tests may also be performed. These may include:  Lung function studies. These tests show how much air you breathe in and out.  Allergy  tests.  Imaging tests such as X-rays. TREATMENT  Asthma cannot be cured, but it can usually be controlled. Treatment involves identifying and avoiding your asthma triggers. It also involves medicines. There are 2 classes of medicine used for asthma treatment:   Controller medicines. These prevent asthma symptoms from occurring. They are usually taken every day.  Reliever or rescue medicines. These quickly relieve asthma symptoms. They are used as needed and provide short-term relief. Your health care provider will help you create an asthma action plan. An asthma action plan is a written plan for managing and treating your asthma attacks. It includes a list of your asthma triggers and how they may be avoided. It also includes information on when medicines should be taken and when their dosage should be changed. An action plan may also involve the use of a device called a peak flow meter. A peak flow meter measures how well the lungs are working. It helps you monitor your condition. HOME CARE INSTRUCTIONS   Take medicines only as directed by your health care provider. Speak with your health care provider if you have questions about how or when to take the medicines.  Use a peak flow meter as directed by your health care provider. Record and keep track of readings.  Understand and use the action plan to help minimize or stop an asthma attack without needing to seek medical care.  Control your home environment in the following ways to help prevent asthma attacks:  Do not smoke. Avoid being exposed to secondhand smoke.  Change your heating and air conditioning filter regularly.  Limit your use of fireplaces and wood stoves.  Get rid of pests (such as roaches and  mice) and their droppings.  Throw away plants if you see mold on them.  Clean your floors and dust regularly. Use unscented cleaning products.  Try to have someone else vacuum for you regularly. Stay out of rooms while they are  being vacuumed and for a short while afterward. If you vacuum, use a dust mask from a hardware store, a double-layered or microfilter vacuum cleaner bag, or a vacuum cleaner with a HEPA filter.  Replace carpet with wood, tile, or vinyl flooring. Carpet can trap dander and dust.  Use allergy-proof pillows, mattress covers, and box spring covers.  Wash bed sheets and blankets every week in hot water and dry them in a dryer.  Use blankets that are made of polyester or cotton.  Clean bathrooms and kitchens with bleach. If possible, have someone repaint the walls in these rooms with mold-resistant paint. Keep out of the rooms that are being cleaned and painted.  Wash hands frequently. SEEK MEDICAL CARE IF:   You have wheezing, shortness of breath, or a cough even if taking medicine to prevent attacks.  The colored mucus you cough up (sputum) is thicker than usual.  Your sputum changes from clear or white to yellow, green, gray, or bloody.  You have any problems that may be related to the medicines you are taking (such as a rash, itching, swelling, or trouble breathing).  You are using a reliever medicine more than 2-3 times per week.  Your peak flow is still at 50-79% of your personal best after following your action plan for 1 hour.  You have a fever. SEEK IMMEDIATE MEDICAL CARE IF:   You seem to be getting worse and are unresponsive to treatment during an asthma attack.  You are short of breath even at rest.  You get short of breath when doing very little physical activity.  You have difficulty eating, drinking, or talking due to asthma symptoms.  You develop chest pain.  You develop a fast heartbeat.  You have a bluish color to your lips or fingernails.  You are light-headed, dizzy, or faint.  Your peak flow is less than 50% of your personal best. MAKE SURE YOU:   Understand these instructions.  Will watch your condition.  Will get help right away if you are not  doing well or get worse. Document Released: 08/29/2005 Document Revised: 01/13/2014 Document Reviewed: 03/28/2013 [facility] Patient Information 2015 [place], [place]. This information is not intended to replace advice given to you by your health care provider. Make sure you discuss any questions you have with your health care provider.  Bronchospasm A bronchospasm is a spasm or tightening of the airways going into the lungs. During a bronchospasm breathing becomes more difficult because the airways get smaller. When this happens there can be coughing, a whistling sound when breathing (wheezing), and difficulty breathing. Bronchospasm is often associated with asthma, but not all patients who experience a bronchospasm have asthma. CAUSES  A bronchospasm is caused by inflammation or irritation of the airways. The inflammation or irritation may be triggered by:   Allergies (such as to animals, pollen, food, or mold). Allergens that cause bronchospasm may cause wheezing immediately after exposure or many hours later.   Infection. Viral infections are believed to be the most common cause of bronchospasm.   Exercise.   Irritants (such as pollution, cigarette smoke, strong odors, aerosol sprays, and paint fumes).   Weather changes. Winds increase molds and pollens in the air. Rain refreshes the air by washing  irritants out. Cold air may cause inflammation.   Stress and emotional upset.  SIGNS AND SYMPTOMS   Wheezing.   Excessive nighttime coughing.   Frequent or severe coughing with a simple cold.   Chest tightness.   Shortness of breath.  DIAGNOSIS  Bronchospasm is usually diagnosed through a history and physical exam. Tests, such as chest X-rays, are sometimes done to look for other conditions. TREATMENT   Inhaled medicines can be given to open up your airways and help you breathe. The medicines can be given using either an inhaler or a nebulizer machine.  Corticosteroid  medicines may be given for severe bronchospasm, usually when it is associated with asthma. HOME CARE INSTRUCTIONS   Always have a plan prepared for seeking medical care. Know when to call your health care provider and local emergency services (911 in the [place] Know where you can access local emergency care.  Only take medicines as directed by your health care provider.  If you were prescribed an inhaler or nebulizer machine, ask your health care provider to explain how to use it correctly. Always use a spacer with your inhaler if you were given one.  It is necessary to remain calm during an attack. Try to relax and breathe more slowly.  Control your home environment in the following ways:   Change your heating and air conditioning filter at least once a month.   Limit your use of fireplaces and wood stoves.  Do not smoke and do not allow smoking in your home.   Avoid exposure to perfumes and fragrances.   Get rid of pests (such as roaches and mice) and their droppings.   Throw away plants if you see mold on them.   Keep your house clean and dust free.   Replace carpet with wood, tile, or vinyl flooring. Carpet can trap dander and dust.   Use allergy-proof pillows, mattress covers, and box spring covers.   Wash bed sheets and blankets every week in hot water and dry them in a dryer.   Use blankets that are made of polyester or cotton.   Wash hands frequently. SEEK MEDICAL CARE IF:   You have muscle aches.   You have chest pain.   The sputum changes from clear or white to yellow, green, gray, or bloody.   The sputum you cough up gets thicker.   There are problems that may be related to the medicine you are given, such as a rash, itching, swelling, or trouble breathing.  SEEK IMMEDIATE MEDICAL CARE IF:   You have worsening wheezing and coughing even after taking your prescribed medicines.   You have increased difficulty breathing.   You develop  severe chest pain. MAKE SURE YOU:   Understand these instructions.  Will watch your condition.  Will get help right away if you are not doing well or get worse. Document Released: 09/01/2003 Document Revised: 09/03/2013 Document Reviewed: 02/18/2013 [facility] Patient Information 2015 [place], [place]. This information is not intended to replace advice given to you by your health care provider. Make sure you discuss any questions you have with your health care provider.

## 2014-08-02 NOTE — ED Notes (Signed)
Patient complains of waking up early morning with sharp pain in central chest left arm numbness.

## 2014-08-02 NOTE — ED Notes (Addendum)
Pt from home c/o shortness of breath and wheezing x 1 week. She was recently diagnosed with bronchitis at [facility] on Friday and was given doxycycline. Pt also c/o central chest pain

## 2014-08-04 ENCOUNTER — Other Ambulatory Visit: Payer: Self-pay

## 2014-08-04 ENCOUNTER — Ambulatory Visit: Payer: Medicare Other | Attending: Internal Medicine | Admitting: Internal Medicine

## 2014-08-04 VITALS — BP 176/94 | HR 85 | Temp 98.4°F | Resp 20

## 2014-08-04 DIAGNOSIS — G4452 New daily persistent headache (NDPH): Secondary | ICD-10-CM

## 2014-08-04 DIAGNOSIS — R030 Elevated blood-pressure reading, without diagnosis of hypertension: Secondary | ICD-10-CM

## 2014-08-04 DIAGNOSIS — IMO0001 Reserved for inherently not codable concepts without codable children: Secondary | ICD-10-CM

## 2014-08-04 DIAGNOSIS — R51 Headache: Secondary | ICD-10-CM | POA: Insufficient documentation

## 2014-08-04 DIAGNOSIS — R079 Chest pain, unspecified: Secondary | ICD-10-CM

## 2014-08-04 MED ORDER — CLONIDINE HCL 0.1 MG PO TABS
0.1000 mg | ORAL_TABLET | Freq: Once | ORAL | Status: AC
  Administered 2014-08-04: 0.1 mg via ORAL

## 2014-08-04 NOTE — Progress Notes (Signed)
Patient walked in c/o headache that began 2 days ago; rates 10/10 at present States she thinks it's from the steroid she was given in ED 2 days ago Patient brought in a bottle of tessalon perles that she has been taking q8h thinking this was a steroid Explained that this is a cough suppressant and not a steroid Patient demonstrated proper use of albuterol inhaler using open mouth technique  Patient's right pupil appears more dilated than left PCP in to examine  Head CT with contrast ordered per PCP    BP 190/106 left arm manually P 85 R 20 T 98.4 oral SPO2 99%  Clonidine 0.1 mg given po per protocol  Patient states that in the 30 minutes she has been in our office she has had 2 different episodes of chest pain lasting 3 minutes each rating 7/10 on pain scale ECG done. Reviewed by PCP  BP 176/94 30 minutes after clonidine administration. Denies any more chest pain Patient now rates headache at 8/10  Instructed to have CT done as directed and keep appt with PCP next week Patient aware she may take acetaminophen as needed for pain

## 2014-08-06 ENCOUNTER — Ambulatory Visit (HOSPITAL_COMMUNITY)
Admission: RE | Admit: 2014-08-06 | Discharge: 2014-08-06 | Disposition: A | Discharge status: Home / Self Care | Payer: Medicare Other | Source: Ambulatory Visit | Attending: Internal Medicine | Admitting: Internal Medicine

## 2014-08-06 DIAGNOSIS — R51 Headache: Secondary | ICD-10-CM | POA: Diagnosis not present

## 2014-08-06 DIAGNOSIS — G4452 New daily persistent headache (NDPH): Secondary | ICD-10-CM | POA: Diagnosis not present

## 2014-08-11 ENCOUNTER — Ambulatory Visit: Payer: Medicare Other | Attending: Internal Medicine | Admitting: Internal Medicine

## 2014-08-11 ENCOUNTER — Other Ambulatory Visit: Payer: Self-pay | Admitting: Internal Medicine

## 2014-08-11 ENCOUNTER — Ambulatory Visit (HOSPITAL_BASED_OUTPATIENT_CLINIC_OR_DEPARTMENT_OTHER): Payer: Medicaid Other | Admitting: *Deleted

## 2014-08-11 ENCOUNTER — Encounter: Payer: Self-pay | Admitting: Internal Medicine

## 2014-08-11 VITALS — BP 135/86 | HR 87 | Temp 98.7°F | Resp 16 | Ht 63.0 in | Wt 171.0 lb

## 2014-08-11 DIAGNOSIS — R0600 Dyspnea, unspecified: Secondary | ICD-10-CM | POA: Diagnosis present

## 2014-08-11 DIAGNOSIS — I252 Old myocardial infarction: Secondary | ICD-10-CM | POA: Insufficient documentation

## 2014-08-11 DIAGNOSIS — Z7982 Long term (current) use of aspirin: Secondary | ICD-10-CM | POA: Insufficient documentation

## 2014-08-11 DIAGNOSIS — Z79899 Other long term (current) drug therapy: Secondary | ICD-10-CM | POA: Diagnosis not present

## 2014-08-11 DIAGNOSIS — I10 Essential (primary) hypertension: Secondary | ICD-10-CM

## 2014-08-11 DIAGNOSIS — F329 Major depressive disorder, single episode, unspecified: Secondary | ICD-10-CM | POA: Diagnosis not present

## 2014-08-11 DIAGNOSIS — I2510 Atherosclerotic heart disease of native coronary artery without angina pectoris: Secondary | ICD-10-CM | POA: Diagnosis not present

## 2014-08-11 DIAGNOSIS — Z23 Encounter for immunization: Secondary | ICD-10-CM

## 2014-08-11 DIAGNOSIS — Z853 Personal history of malignant neoplasm of breast: Secondary | ICD-10-CM | POA: Diagnosis not present

## 2014-08-11 DIAGNOSIS — F419 Anxiety disorder, unspecified: Secondary | ICD-10-CM | POA: Insufficient documentation

## 2014-08-11 DIAGNOSIS — K219 Gastro-esophageal reflux disease without esophagitis: Secondary | ICD-10-CM | POA: Insufficient documentation

## 2014-08-11 DIAGNOSIS — R05 Cough: Secondary | ICD-10-CM | POA: Diagnosis present

## 2014-08-11 DIAGNOSIS — J45909 Unspecified asthma, uncomplicated: Secondary | ICD-10-CM | POA: Diagnosis not present

## 2014-08-11 DIAGNOSIS — J209 Acute bronchitis, unspecified: Secondary | ICD-10-CM | POA: Insufficient documentation

## 2014-08-11 NOTE — Progress Notes (Signed)
Pt is here today following up after her ED visit with bronchitis. Pt reports feeling better but still has a persistent cough.

## 2014-08-11 NOTE — Patient Instructions (Signed)

## 2014-08-11 NOTE — Progress Notes (Signed)
Patient ID: [name], female   DOB: Jan 16, 1951, 63 y.o.   MRN: [id number]   [name], is a 63 y.o. female  [id number]  [id number]  DOB - 1951/08/26  Chief Complaint  Patient presents with  . Follow-up        Subjective:   [name] is a 63 y.o. female here today for a follow up visit. Patient was recently seen in the emergency room for evaluation of difficulty breathing and cough. She says she was feeling wheezy and did not have a albuterol. Cough was productive of whitish sputum. She is here today for follow-up of ED visit. She feels better today, cough is less, denies headache. She recently had a CT head for the evaluation of a severe headache, which showed no acute intracranial abnormality. Patient does not smoke cigarettes, she does not drink alcohol. Patient has No chest pain, No abdominal pain - No Nausea, No new weakness tingling or numbness, No Cough - SOB.  No problems updated.  ALLERGIES: Allergies  Allergen Reactions  . Amoxicillin Swelling    Throat Swells  . Azithromycin Swelling    Throat Swelling  . Bromfed Swelling    Throat Swelling  . Cephalexin Swelling    Throat Swelling  . Chlordiazepoxide-Clidinium Swelling    Throat Swelling  . Clotrimazole Other (See Comments) and Hypertension    Patient told me that she couldn't swallow due to the medication  . Dicyclomine Hcl Hives  . Gatifloxacin     Other reaction(s): Other (See Comments)  . Hydralazine Hcl     Rash and itching  . Ibuprofen     N/V  . Iohexol      Code: HIVES, Desc: throat swelling no hives 20 yrs ago;needs pre-medication  09/19/07 sg, Onset Date: [id number]   . Latex Swelling    Blisters on Skin  . Lidocaine Hives  . Lisinopril     Other reaction(s): Angioedema (ALLERGY/intolerance)  . Librax  [Chlordiazepoxide-Clidinium]     Other reaction(s): Other (See Comments)  . Paroxetine Swelling    Throat Swelling  . Penicillins Hives  . Prednisone Swelling    Throat swelling   . Pregabalin     Other reaction(s): Other (See Comments) nervousness  . Propoxyphene N-Acetaminophen Swelling    REACTION: swelling in the throat  . Sertraline     Other reaction(s): Other (See Comments)  . Sertraline Hcl Swelling    Throat Swelling  . Sulfa Antibiotics     Other reaction(s): Unknown  . Sulfadiazine Swelling    Throat Swelling  . Tussionex Pennkinetic Er [Hydrocod Polst-Cpm Polst Er] Itching    "Sunburn"  . Verapamil Swelling    Throat Swelling    PAST MEDICAL HISTORY: Past Medical History  Diagnosis Date  . Myocardial infarct 2005  . CAD (coronary artery disease)   . Hypertension   . Long term (current) use of anticoagulants   . Edema   . Hematoma   . Sinusitis acute   . Adenocarcinoma of breast     right  . Acute upper respiratory infections of unspecified site   . Depression   . Anal fissure   . GERD (gastroesophageal reflux disease)   . Dog bite(E906.0)   . Diverticulosis of colon (without mention of hemorrhage)   . Spinal stenosis, lumbar region, without neurogenic claudication   . Anxiety   . Panic attacks   . Asthma   . Irritable bowel syndrome   . Hiatal hernia   . Jaundice  Hx of Jaundice at age 89 from "dirty restuarant". Unsure of Hepatitis type    MEDICATIONS AT HOME: Prior to Admission medications   Medication Sig Start Date End Date Taking? Authorizing Provider  albuterol (PROVENTIL HFA;VENTOLIN HFA) 108 (90 BASE) MCG/ACT inhaler Inhale 2 puffs into the lungs every 4 (four) hours as needed for wheezing or shortness of breath. 07/27/14  Yes Johnna Acosta, MD  ALPRAZolam Duanne Moron) 1 MG tablet Take 0.5-1 tablets (0.5-1 mg total) by mouth 2 (two) times daily as needed for anxiety or sleep. 07/14/14  Yes Tresa Garter, MD  ascorbic acid (VITAMIN C) 1000 MG tablet Take 1,000 mg by mouth daily.   Yes Historical Provider, MD  aspirin EC 81 MG tablet Take 81 mg by mouth daily.   Yes Historical Provider, MD  Calcium Carbonate-Vitamin  D (CALCIUM-CARB 600 + D) 600-125 MG-UNIT TABS Take 1 capsule by mouth daily.    Yes Historical Provider, MD  cetirizine (ZYRTEC) 10 MG tablet Take 10 mg by mouth daily.   Yes Historical Provider, MD  cycloSPORINE (RESTASIS) 0.05 % ophthalmic emulsion Place 1 drop into both eyes 2 (two) times daily.     Yes Historical Provider, MD  Fluticasone-Salmeterol (ADVAIR) 250-50 MCG/DOSE AEPB Inhale 1 puff into the lungs 2 (two) times daily. 03/24/14  Yes Tresa Garter, MD  gabapentin (NEURONTIN) 300 MG capsule Take 1 capsule (300 mg total) by mouth at bedtime. Take 1 cap at bedtime then 2 caps times 7 days then 3 caps qhs 08/19/13  Yes Janece Canterbury, MD  pantoprazole (PROTONIX) 40 MG tablet Take 1 tablet (40 mg total) by mouth daily. 07/14/14  Yes Tresa Garter, MD  pravastatin (PRAVACHOL) 40 MG tablet Take 1 tablet (40 mg total) by mouth every evening. 07/14/14  Yes Tresa Garter, MD  predniSONE (DELTASONE) 20 MG tablet Take 1 tablet (20 mg total) by mouth daily with breakfast. 08/02/14  Yes Tanna Furry, MD  valsartan (DIOVAN) 320 MG tablet Take 320 mg by mouth daily.   Yes Historical Provider, MD  vitamin E (VITAMIN E) 400 UNIT capsule Take 400 Units by mouth daily.   Yes Historical Provider, MD  VOLTAREN 1 % GEL Apply 2 g topically 2 (two) times daily as needed (pain).  06/30/13  Yes Historical Provider, MD  Ipratropium-Albuterol (COMBIVENT) 20-100 MCG/ACT AERS respimat Inhale 1 puff into the lungs every 6 (six) hours as needed for wheezing or shortness of breath. Patient not taking: Reported on 08/11/2014 12/23/13   Tresa Garter, MD  metoprolol (LOPRESSOR) 50 MG tablet Take 1 tablet (50 mg total) by mouth daily. Patient not taking: Reported on 08/11/2014 07/14/14   Tresa Garter, MD     Objective:   Filed Vitals:   08/11/14 1431  BP: 135/86  Pulse: 87  Temp: 98.7 F (37.1 C)  TempSrc: Oral  Resp: 16  Height: 5\' 3"  (1.6 m)  Weight: 171 lb (77.565 kg)  SpO2: 96%     Exam General appearance : Awake, alert, not in any distress. Speech Clear. Not toxic looking HEENT: Atraumatic and Normocephalic, pupils equally reactive to light and accomodation Neck: supple, no JVD. No cervical lymphadenopathy.  Chest:Good air entry bilaterally, no added sounds  CVS: S1 S2 regular, no murmurs.  Abdomen: Bowel sounds present, Non tender and not distended with no gaurding, rigidity or rebound. Extremities: B/L Lower Ext shows no edema, both legs are warm to touch Neurology: Awake alert, and oriented X 3, CN II-XII intact, Non focal  Skin:No Rash Wounds:N/A  Data Review Lab Results  Component Value Date   HGBA1C 5.8* 11/26/2013   HGBA1C 6.2 06/10/2011   HGBA1C * 07/16/2010    5.9 (NOTE)                                                                       According to the ADA Clinical Practice Recommendations for 2011, when HbA1c is used as a screening test:   >=6.5%   Diagnostic of Diabetes Mellitus           (if abnormal result  is confirmed)  5.7-6.4%   Increased risk of developing Diabetes Mellitus  References:Diagnosis and Classification of Diabetes Mellitus,Diabetes WNIO,2703,50(KXFGH 1):S62-S69 and Standards of Medical Care in         Diabetes - 2011,Diabetes WEXH,3716,96  (Suppl 1):S11-S61.     Assessment & Plan   1. Essential hypertension Continue current medication, valsartan 320 mg tablet by mouth daily DASH diet  2. Acute bronchitis, unspecified organism Continue cough syrup Symptomatic treatment Inhalers   Patient was extensively counseled about nutrition and exercise.   Return in about 3 months (around 11/10/2014), or if symptoms worsen or fail to improve, for Follow up HTN.  The patient was given clear instructions to go to ER or return to medical center if symptoms don't improve, worsen or new problems develop. The patient verbalized understanding. The patient was told to call to get lab results if they haven't heard anything in the next  week.   This note has been created with Surveyor, quantity. Any transcriptional errors are unintentional.    Angelica Chessman, MD, [place], [place], [place] and [place], [place]   08/17/2014, 11:10 PM

## 2014-08-19 ENCOUNTER — Telehealth: Payer: Self-pay | Admitting: Emergency Medicine

## 2014-08-19 DIAGNOSIS — G894 Chronic pain syndrome: Secondary | ICD-10-CM | POA: Diagnosis not present

## 2014-08-19 DIAGNOSIS — M5417 Radiculopathy, lumbosacral region: Secondary | ICD-10-CM | POA: Diagnosis not present

## 2014-08-19 DIAGNOSIS — M5136 Other intervertebral disc degeneration, lumbar region: Secondary | ICD-10-CM | POA: Diagnosis not present

## 2014-08-19 DIAGNOSIS — M4696 Unspecified inflammatory spondylopathy, lumbar region: Secondary | ICD-10-CM | POA: Diagnosis not present

## 2014-08-19 NOTE — Telephone Encounter (Signed)
Pt given CT results with information to watch blood pressure with complaint taking meds,regular exercise and DASH diet

## 2014-08-19 NOTE — Telephone Encounter (Signed)
-----   Message from Tresa Garter, MD sent at 08/13/2014 12:08 PM EST ----- Please inform patient that her CT head shows no acute abnormality however there are chronic effect of hypertension on the brain blood vessels. Advice good control of blood pressure , regular physical exercise and DASH diet.

## 2014-08-21 ENCOUNTER — Ambulatory Visit: Payer: Medicare Other | Attending: Internal Medicine | Admitting: *Deleted

## 2014-08-21 ENCOUNTER — Other Ambulatory Visit: Payer: Self-pay | Admitting: Internal Medicine

## 2014-08-21 VITALS — BP 168/94 | HR 85 | Temp 98.6°F | Resp 20

## 2014-08-21 DIAGNOSIS — I10 Essential (primary) hypertension: Secondary | ICD-10-CM | POA: Diagnosis not present

## 2014-08-21 MED ORDER — HYDROCHLOROTHIAZIDE 12.5 MG PO TABS: 12.5000 mg | ORAL_TABLET | Freq: Every day | ORAL | Status: DC

## 2014-08-21 NOTE — Progress Notes (Signed)
Patient presents for BP check Med list reviewed; states taking all meds as directed Patient checking BP at home; states ranging 150s/103-106 Denies headache, blurred vision, chest pain but c/o chest "tightness" since last week States walking at least 30 minutes per day Does not add salt to food but will be more conscious of sodium content in foods she buys  BP 168/94 P 85 R  20 T  98.6 oral SPO2  99%  Per PCP: Add HCTZ 12.5 mg daily Return in 2 weeks for nurse visit for BP check  Patient advised to call for med refills at least 7 days before running out so as not to go without. Patient aware that she is to f/u with PCP 3 months from last visit (due 2/30/16)

## 2014-09-09 ENCOUNTER — Telehealth: Payer: Self-pay | Admitting: Emergency Medicine

## 2014-09-09 NOTE — Telephone Encounter (Signed)
Pt instructed to call tomorrow to reschedule nurse visit for next week

## 2014-09-18 ENCOUNTER — Ambulatory Visit: Payer: Medicare Other | Attending: Internal Medicine | Admitting: Internal Medicine

## 2014-09-18 ENCOUNTER — Encounter: Payer: Self-pay | Admitting: Internal Medicine

## 2014-09-18 VITALS — BP 137/85 | HR 80 | Temp 98.5°F | Resp 16 | Ht 63.0 in | Wt 172.0 lb

## 2014-09-18 DIAGNOSIS — J322 Chronic ethmoidal sinusitis: Secondary | ICD-10-CM | POA: Diagnosis not present

## 2014-09-18 DIAGNOSIS — J45909 Unspecified asthma, uncomplicated: Secondary | ICD-10-CM | POA: Diagnosis not present

## 2014-09-18 DIAGNOSIS — K219 Gastro-esophageal reflux disease without esophagitis: Secondary | ICD-10-CM | POA: Insufficient documentation

## 2014-09-18 DIAGNOSIS — Z7951 Long term (current) use of inhaled steroids: Secondary | ICD-10-CM | POA: Insufficient documentation

## 2014-09-18 DIAGNOSIS — I2510 Atherosclerotic heart disease of native coronary artery without angina pectoris: Secondary | ICD-10-CM | POA: Insufficient documentation

## 2014-09-18 DIAGNOSIS — Z7982 Long term (current) use of aspirin: Secondary | ICD-10-CM | POA: Insufficient documentation

## 2014-09-18 DIAGNOSIS — F419 Anxiety disorder, unspecified: Secondary | ICD-10-CM | POA: Insufficient documentation

## 2014-09-18 DIAGNOSIS — I10 Essential (primary) hypertension: Secondary | ICD-10-CM | POA: Diagnosis not present

## 2014-09-18 DIAGNOSIS — Z79899 Other long term (current) drug therapy: Secondary | ICD-10-CM | POA: Diagnosis not present

## 2014-09-18 MED ORDER — DOXYCYCLINE HYCLATE 100 MG PO TABS: 100.0000 mg | ORAL_TABLET | Freq: Two times a day (BID) | ORAL | Status: DC

## 2014-09-18 MED ORDER — CETIRIZINE HCL 10 MG PO TABS: 10.0000 mg | ORAL_TABLET | Freq: Every day | ORAL | Status: DC

## 2014-09-18 NOTE — Patient Instructions (Signed)

## 2014-09-18 NOTE — Progress Notes (Signed)
Patient ID: TESSIA KASSIN, female   DOB: January 05, 1951, 64 y.o.   MRN: [id number]   [name], is a 64 y.o. female  [id number]:[id number]  BJS:[id number]  DOB - 11/12/1950  Chief Complaint  Patient presents with  . Follow-up        Subjective:   [name] is a 64 y.o. female here today for a follow up visit. Patient medical history significant for hypertension, coronary artery disease, depression, GERD, spinal stenosis, IBS, came into clinic today for follow-up of her hypertension. She complains of ongoing sinus pressure and pain with yellowish mucus discharge. She also reports cramping in her legs especially on the right for about 2 days. Patient has No headache, No chest pain, No abdominal pain - No Nausea, No new weakness tingling or numbness, No Cough - SOB.  Problem  Chronic Ethmoidal Sinusitis    ALLERGIES: Allergies  Allergen Reactions  . Amoxicillin Swelling    Throat Swells  . Azithromycin Swelling    Throat Swelling  . Bromfed Swelling    Throat Swelling  . Cephalexin Swelling    Throat Swelling  . Chlordiazepoxide-Clidinium Swelling    Throat Swelling  . Clotrimazole Other (See Comments) and Hypertension    Patient told me that she couldn't swallow due to the medication  . Dicyclomine Hcl Hives  . Gatifloxacin     Other reaction(s): Other (See Comments)  . Hydralazine Hcl     Rash and itching  . Ibuprofen     N/V  . Iohexol      Code: HIVES, Desc: throat swelling no hives 20 yrs ago;needs pre-medication  09/19/07 sg, Onset Date: [id number]   . Latex Swelling    Blisters on Skin  . Lidocaine Hives  . Lisinopril     Other reaction(s): Angioedema (ALLERGY/intolerance)  . Librax  [Chlordiazepoxide-Clidinium]     Other reaction(s): Other (See Comments)  . Paroxetine Swelling    Throat Swelling  . Penicillins Hives  . Prednisone Swelling    Throat swelling  . Pregabalin     Other reaction(s): Other (See Comments) nervousness  . Propoxyphene N-Acetaminophen  Swelling    REACTION: swelling in the throat  . Sertraline     Other reaction(s): Other (See Comments)  . Sertraline Hcl Swelling    Throat Swelling  . Sulfa Antibiotics     Other reaction(s): Unknown  . Sulfadiazine Swelling    Throat Swelling  . Tussionex Pennkinetic Er [Hydrocod Polst-Cpm Polst Er] Itching    "Sunburn"  . Verapamil Swelling    Throat Swelling    PAST MEDICAL HISTORY: Past Medical History  Diagnosis Date  . Myocardial infarct 2005  . CAD (coronary artery disease)   . Hypertension   . Long term (current) use of anticoagulants   . Edema   . Hematoma   . Sinusitis acute   . Adenocarcinoma of breast     right  . Acute upper respiratory infections of unspecified site   . Depression   . Anal fissure   . GERD (gastroesophageal reflux disease)   . Dog bite(E906.0)   . Diverticulosis of colon (without mention of hemorrhage)   . Spinal stenosis, lumbar region, without neurogenic claudication   . Anxiety   . Panic attacks   . Asthma   . Irritable bowel syndrome   . Hiatal hernia   . Jaundice     Hx of Jaundice at age 19 from "dirty restuarant". Unsure of Hepatitis type    MEDICATIONS AT HOME: Prior to Admission  medications   Medication Sig Start Date End Date Taking? Authorizing Provider  albuterol (PROVENTIL HFA;VENTOLIN HFA) 108 (90 BASE) MCG/ACT inhaler Inhale 2 puffs into the lungs every 4 (four) hours as needed for wheezing or shortness of breath. 07/27/14  Yes Johnna Acosta, MD  ALPRAZolam Duanne Moron) 1 MG tablet Take 0.5-1 tablets (0.5-1 mg total) by mouth 2 (two) times daily as needed for anxiety or sleep. 07/14/14  Yes Tresa Garter, MD  ascorbic acid (VITAMIN C) 1000 MG tablet Take 1,000 mg by mouth daily.   Yes Historical Provider, MD  aspirin EC 81 MG tablet Take 81 mg by mouth daily.   Yes Historical Provider, MD  Calcium Carbonate-Vitamin D (CALCIUM-CARB 600 + D) 600-125 MG-UNIT TABS Take 1 capsule by mouth daily.    Yes Historical Provider,  MD  cetirizine (ZYRTEC) 10 MG tablet Take 1 tablet (10 mg total) by mouth daily. 09/18/14  Yes Tresa Garter, MD  cycloSPORINE (RESTASIS) 0.05 % ophthalmic emulsion Place 1 drop into both eyes 2 (two) times daily.     Yes Historical Provider, MD  Fluticasone-Salmeterol (ADVAIR) 250-50 MCG/DOSE AEPB Inhale 1 puff into the lungs 2 (two) times daily. 03/24/14  Yes Tresa Garter, MD  gabapentin (NEURONTIN) 300 MG capsule Take 1 capsule (300 mg total) by mouth at bedtime. Take 1 cap at bedtime then 2 caps times 7 days then 3 caps qhs 08/19/13  Yes Janece Canterbury, MD  hydrochlorothiazide (HYDRODIURIL) 12.5 MG tablet Take 1 tablet (12.5 mg total) by mouth daily. 08/21/14  Yes Tresa Garter, MD  metoprolol (LOPRESSOR) 50 MG tablet Take 1 tablet (50 mg total) by mouth daily. 07/14/14  Yes Tresa Garter, MD  pantoprazole (PROTONIX) 40 MG tablet Take 1 tablet (40 mg total) by mouth daily. 07/14/14  Yes Tresa Garter, MD  pravastatin (PRAVACHOL) 40 MG tablet Take 1 tablet (40 mg total) by mouth every evening. 07/14/14  Yes Tresa Garter, MD  valsartan (DIOVAN) 320 MG tablet Take 320 mg by mouth daily.   Yes Historical Provider, MD  vitamin E (VITAMIN E) 400 UNIT capsule Take 400 Units by mouth daily.   Yes Historical Provider, MD  VOLTAREN 1 % GEL Apply 2 g topically 2 (two) times daily as needed (pain).  06/30/13  Yes Historical Provider, MD  doxycycline (VIBRA-TABS) 100 MG tablet Take 1 tablet (100 mg total) by mouth 2 (two) times daily. 09/18/14   Tresa Garter, MD  Ipratropium-Albuterol (COMBIVENT) 20-100 MCG/ACT AERS respimat Inhale 1 puff into the lungs every 6 (six) hours as needed for wheezing or shortness of breath. Patient not taking: Reported on 08/11/2014 12/23/13   Tresa Garter, MD  predniSONE (DELTASONE) 20 MG tablet Take 1 tablet (20 mg total) by mouth daily with breakfast. Patient not taking: Reported on 08/21/2014 08/02/14   Tanna Furry, MD      Objective:   Filed Vitals:   09/18/14 1719  BP: 137/85  Pulse: 80  Temp: 98.5 F (36.9 C)  TempSrc: Oral  Resp: 16  Height: 5\' 3"  (1.6 m)  Weight: 172 lb (78.019 kg)  SpO2: 97%    Exam General appearance : Awake, alert, not in any distress. Speech Clear. Not toxic looking HEENT: Atraumatic and Normocephalic, pupils equally reactive to light and accomodation Neck: supple, no JVD. No cervical lymphadenopathy.  Chest:Good air entry bilaterally, no added sounds  CVS: S1 S2 regular, no murmurs.  Abdomen: Bowel sounds present, Non tender and not distended with no  gaurding, rigidity or rebound. Extremities: B/L Lower Ext shows no edema, both legs are warm to touch Neurology: Awake alert, and oriented X 3, CN II-XII intact, Non focal Skin:No Rash Wounds:N/A  Data Review Lab Results  Component Value Date   HGBA1C 5.8* 11/26/2013   HGBA1C 6.2 06/10/2011   HGBA1C * 07/16/2010    5.9 (NOTE)                                                                       According to the ADA Clinical Practice Recommendations for 2011, when HbA1c is used as a screening test:   >=6.5%   Diagnostic of Diabetes Mellitus           (if abnormal result  is confirmed)  5.7-6.4%   Increased risk of developing Diabetes Mellitus  References:Diagnosis and Classification of Diabetes Mellitus,Diabetes RNHA,5790,38(BFXOV 1):S62-S69 and Standards of Medical Care in         Diabetes - 2011,Diabetes Care,2011,34  (Suppl 1):S11-S61.     Assessment & Plan   1. Chronic ethmoidal sinusitis  - doxycycline (VIBRA-TABS) 100 MG tablet; Take 1 tablet (100 mg total) by mouth 2 (two) times daily.  Dispense: 20 tablet; Refill: 0 - cetirizine (ZYRTEC) 10 MG tablet; Take 1 tablet (10 mg total) by mouth daily.  Dispense: 30 tablet; Refill: 0  2. Essential hypertension Continue current regimen. DASH Diet  Patient was counseled extensively about nutrition and exercise.  Return in about 3 months (around 12/18/2014),  or if symptoms worsen or fail to improve, for Follow up HTN, Annual Physical.  The patient was given clear instructions to go to ER or return to medical center if symptoms don't improve, worsen or new problems develop. The patient verbalized understanding. The patient was told to call to get lab results if they haven't heard anything in the next week.   This note has been created with Surveyor, quantity. Any transcriptional errors are unintentional.    Angelica Chessman, MD, [place], [place], [place] [facility] [place], [place]   09/18/2014, 5:46 PM

## 2014-09-18 NOTE — Progress Notes (Signed)
Pt is here following up on her HTN. Pt states that she is sinus issues w/ yellow mucus and she is feeling tired. Pt also reports having cramping in her right leg for about 2 days.

## 2014-09-21 ENCOUNTER — Emergency Department (HOSPITAL_COMMUNITY)
Admission: EM | Admit: 2014-09-21 | Discharge: 2014-09-21 | Disposition: A | Discharge status: Home / Self Care | Payer: Medicare Other | Attending: Emergency Medicine | Admitting: Emergency Medicine

## 2014-09-21 ENCOUNTER — Encounter (HOSPITAL_COMMUNITY): Payer: Self-pay

## 2014-09-21 DIAGNOSIS — Z7952 Long term (current) use of systemic steroids: Secondary | ICD-10-CM | POA: Diagnosis not present

## 2014-09-21 DIAGNOSIS — F329 Major depressive disorder, single episode, unspecified: Secondary | ICD-10-CM | POA: Insufficient documentation

## 2014-09-21 DIAGNOSIS — J45909 Unspecified asthma, uncomplicated: Secondary | ICD-10-CM | POA: Diagnosis not present

## 2014-09-21 DIAGNOSIS — F410 Panic disorder [episodic paroxysmal anxiety] without agoraphobia: Secondary | ICD-10-CM | POA: Insufficient documentation

## 2014-09-21 DIAGNOSIS — Z91040 Latex allergy status: Secondary | ICD-10-CM | POA: Insufficient documentation

## 2014-09-21 DIAGNOSIS — Z880 Allergy status to penicillin: Secondary | ICD-10-CM | POA: Diagnosis not present

## 2014-09-21 DIAGNOSIS — W540XXA Bitten by dog, initial encounter: Secondary | ICD-10-CM | POA: Diagnosis not present

## 2014-09-21 DIAGNOSIS — Y9289 Other specified places as the place of occurrence of the external cause: Secondary | ICD-10-CM | POA: Insufficient documentation

## 2014-09-21 DIAGNOSIS — Z7982 Long term (current) use of aspirin: Secondary | ICD-10-CM | POA: Diagnosis not present

## 2014-09-21 DIAGNOSIS — Y998 Other external cause status: Secondary | ICD-10-CM | POA: Diagnosis not present

## 2014-09-21 DIAGNOSIS — S61412A Laceration without foreign body of left hand, initial encounter: Secondary | ICD-10-CM | POA: Diagnosis not present

## 2014-09-21 DIAGNOSIS — Z87891 Personal history of nicotine dependence: Secondary | ICD-10-CM | POA: Insufficient documentation

## 2014-09-21 DIAGNOSIS — Y9389 Activity, other specified: Secondary | ICD-10-CM | POA: Insufficient documentation

## 2014-09-21 DIAGNOSIS — Z7901 Long term (current) use of anticoagulants: Secondary | ICD-10-CM | POA: Insufficient documentation

## 2014-09-21 DIAGNOSIS — Z792 Long term (current) use of antibiotics: Secondary | ICD-10-CM | POA: Insufficient documentation

## 2014-09-21 DIAGNOSIS — I252 Old myocardial infarction: Secondary | ICD-10-CM | POA: Diagnosis not present

## 2014-09-21 DIAGNOSIS — Z79899 Other long term (current) drug therapy: Secondary | ICD-10-CM | POA: Insufficient documentation

## 2014-09-21 DIAGNOSIS — Z9861 Coronary angioplasty status: Secondary | ICD-10-CM | POA: Diagnosis not present

## 2014-09-21 DIAGNOSIS — I10 Essential (primary) hypertension: Secondary | ICD-10-CM | POA: Insufficient documentation

## 2014-09-21 DIAGNOSIS — Z853 Personal history of malignant neoplasm of breast: Secondary | ICD-10-CM | POA: Insufficient documentation

## 2014-09-21 DIAGNOSIS — S61452A Open bite of left hand, initial encounter: Secondary | ICD-10-CM

## 2014-09-21 DIAGNOSIS — Z7951 Long term (current) use of inhaled steroids: Secondary | ICD-10-CM | POA: Insufficient documentation

## 2014-09-21 DIAGNOSIS — I2510 Atherosclerotic heart disease of native coronary artery without angina pectoris: Secondary | ICD-10-CM | POA: Diagnosis not present

## 2014-09-21 DIAGNOSIS — K219 Gastro-esophageal reflux disease without esophagitis: Secondary | ICD-10-CM | POA: Diagnosis not present

## 2014-09-21 DIAGNOSIS — Z9889 Other specified postprocedural states: Secondary | ICD-10-CM | POA: Insufficient documentation

## 2014-09-21 MED ORDER — PROMETHAZINE HCL 12.5 MG PO TABS: 12.5000 mg | ORAL_TABLET | Freq: Four times a day (QID) | ORAL | Status: DC | PRN

## 2014-09-21 MED ORDER — OXYCODONE HCL 5 MG PO TABS: 5.0000 mg | ORAL_TABLET | Freq: Four times a day (QID) | ORAL | Status: DC | PRN

## 2014-09-21 MED ORDER — PROMETHAZINE HCL 25 MG PO TABS: 12.5000 mg | ORAL_TABLET | Freq: Once | ORAL | Status: DC

## 2014-09-21 MED ORDER — OXYCODONE HCL 5 MG PO TABS
5.0000 mg | ORAL_TABLET | ORAL | Status: DC
  Filled 2014-09-21: qty 1

## 2014-09-21 NOTE — Discharge Instructions (Signed)
Animal Bite Animal bite wounds can get infected. It is important to get proper medical treatment. Ask your doctor if you need a rabies shot. HOME CARE   Follow your doctor's instructions for taking care of your wound.  Only take medicine as told by your doctor.  Take your medicine (antibiotics) as told. Finish them even if you start to feel better.  Keep all doctor visits as told. You may need a tetanus shot if:   You cannot remember when you had your last tetanus shot.  You have never had a tetanus shot.  The injury broke your skin. If you need a tetanus shot and you choose not to have one, you may get tetanus. Sickness from tetanus can be serious. GET HELP RIGHT AWAY IF:   Your wound is warm, red, sore, or puffy (swollen).  You notice yellowish-white fluid (pus) or a bad smell coming from the wound.  You see a red line on the skin coming from the wound.  You have a fever, chills, or you feel sick.  You feel sick to your stomach (nauseous), or you throw up (vomit).  Your pain does not go away, or it gets worse.  You have trouble moving the injured part.  You have questions or concerns. MAKE SURE YOU:   Understand these instructions.  Will watch your condition.  Will get help right away if you are not doing well or get worse. Document Released: 08/29/2005 Document Revised: 11/21/2011 Document Reviewed: 04/20/2011 [facility] Patient Information 2015 [place], [place]. This information is not intended to replace advice given to you by your health care provider. Make sure you discuss any questions you have with your health care provider. Please watch for signs of infection.  Follow-up with your primary care physician as needed

## 2014-09-21 NOTE — ED Notes (Signed)
Patient reports bite on left hand from her dog.  She reports he is caught up on vaccinations.

## 2014-09-21 NOTE — ED Provider Notes (Signed)
CSN: [id number]     Arrival date & time 09/21/14  1955 History  This chart was scribed for non-physician practitioner, Junius Creamer, [place] working with Tanna Furry, MD by Frederich Balding, ED scribe. This patient was seen in room [place] and the patient's care was started at 8:14 PM.   Chief Complaint  Patient presents with  . Animal Bite   The history is provided by the patient. No language interpreter was used.    HPI Comments: [name] is a 64 y.o. female who presents to the Emergency Department complaining of a dog bite to her left hand that occurred prior to arrival. States her 34 month old puppy bit her while playing around. It is up to date on its immunizations. Pt's last tetanus was 7 years ago. States she is currently on doxycycline for a sinus infection.   Past Medical History  Diagnosis Date  . Myocardial infarct 2005  . CAD (coronary artery disease)   . Hypertension   . Long term (current) use of anticoagulants   . Edema   . Hematoma   . Sinusitis acute   . Adenocarcinoma of breast     right  . Acute upper respiratory infections of unspecified site   . Depression   . Anal fissure   . GERD (gastroesophageal reflux disease)   . Dog bite(E906.0)   . Diverticulosis of colon (without mention of hemorrhage)   . Spinal stenosis, lumbar region, without neurogenic claudication   . Anxiety   . Panic attacks   . Asthma   . Irritable bowel syndrome   . Hiatal hernia   . Jaundice     Hx of Jaundice at age 36 from "dirty restuarant". Unsure of Hepatitis type   Past Surgical History  Procedure Laterality Date  . Partial hysterectomy  1987  . Cardiac catheterization  2001  . Coronary angioplasty with stent placement  2001  . Cholecystectomy    . Bladder repair      tact  . Breast lumpectomy Right   . Breast reconstruction Right   . Breast reduction surgery Left   . Colonoscopy  2013    Diverticulosis  . Cataract extraction    . Esophageal manometry  10/08/2012     Procedure: ESOPHAGEAL MANOMETRY (EM);  Surgeon: Sable Feil, MD;  Location: [facility];  Service: Endoscopy;  Laterality: N/A;  . Esophagogastroduodenoscopy  2014    Normal    Family History  Problem Relation Age of Onset  . Hypertension Mother   . Heart disease Mother   . Dementia Mother   . Arthritis Mother   . Diabetes Mother   . Colon polyps Mother   . Prostate cancer Father   . Hypertension Father   . Colon polyps Father   . Lung cancer Maternal Uncle   . Hypertension Sister   . Hypertension Brother   . Heart disease Sister   . Rectal cancer Neg Hx   . Stomach cancer Neg Hx   . Colon cancer Cousin   . Inflammatory bowel disease Sister    History  Substance Use Topics  . Smoking status: Former Smoker -- 0.30 packs/day for 8 years    Types: Cigarettes    Quit date: 09/13/1983  . Smokeless tobacco: Never Used  . Alcohol Use: No   OB History    No data available     Review of Systems  Skin: Positive for wound.  All other systems reviewed and are negative.  Allergies  Amoxicillin;  Azithromycin; Bromfed; Cephalexin; Chlordiazepoxide-clidinium; Clotrimazole; Dicyclomine hcl; Gatifloxacin; Hydralazine hcl; Ibuprofen; Iohexol; Latex; Lidocaine; Lisinopril; Librax ; Paroxetine; Penicillins; Prednisone; Pregabalin; Propoxyphene n-acetaminophen; Sertraline; Sertraline hcl; Sulfa antibiotics; Sulfadiazine; Tussionex pennkinetic er; and Verapamil  Home Medications   Prior to Admission medications   Medication Sig Start Date End Date Taking? Authorizing Provider  albuterol (PROVENTIL HFA;VENTOLIN HFA) 108 (90 BASE) MCG/ACT inhaler Inhale 2 puffs into the lungs every 4 (four) hours as needed for wheezing or shortness of breath. 07/27/14   Johnna Acosta, MD  ALPRAZolam Duanne Moron) 1 MG tablet Take 0.5-1 tablets (0.5-1 mg total) by mouth 2 (two) times daily as needed for anxiety or sleep. 07/14/14   Tresa Garter, MD  ascorbic acid (VITAMIN C) 1000 MG tablet Take 1,000  mg by mouth daily.    Historical Provider, MD  aspirin EC 81 MG tablet Take 81 mg by mouth daily.    Historical Provider, MD  Calcium Carbonate-Vitamin D (CALCIUM-CARB 600 + D) 600-125 MG-UNIT TABS Take 1 capsule by mouth daily.     Historical Provider, MD  cetirizine (ZYRTEC) 10 MG tablet Take 1 tablet (10 mg total) by mouth daily. 09/18/14   Tresa Garter, MD  cycloSPORINE (RESTASIS) 0.05 % ophthalmic emulsion Place 1 drop into both eyes 2 (two) times daily.      Historical Provider, MD  doxycycline (VIBRA-TABS) 100 MG tablet Take 1 tablet (100 mg total) by mouth 2 (two) times daily. 09/18/14   Tresa Garter, MD  Fluticasone-Salmeterol (ADVAIR) 250-50 MCG/DOSE AEPB Inhale 1 puff into the lungs 2 (two) times daily. 03/24/14   Tresa Garter, MD  gabapentin (NEURONTIN) 300 MG capsule Take 1 capsule (300 mg total) by mouth at bedtime. Take 1 cap at bedtime then 2 caps times 7 days then 3 caps qhs 08/19/13   Janece Canterbury, MD  hydrochlorothiazide (HYDRODIURIL) 12.5 MG tablet Take 1 tablet (12.5 mg total) by mouth daily. 08/21/14   Tresa Garter, MD  Ipratropium-Albuterol (COMBIVENT) 20-100 MCG/ACT AERS respimat Inhale 1 puff into the lungs every 6 (six) hours as needed for wheezing or shortness of breath. Patient not taking: Reported on 08/11/2014 12/23/13   Tresa Garter, MD  metoprolol (LOPRESSOR) 50 MG tablet Take 1 tablet (50 mg total) by mouth daily. 07/14/14   Tresa Garter, MD  oxyCODONE (OXY IR/ROXICODONE) 5 MG immediate release tablet Take 1 tablet (5 mg total) by mouth every 6 (six) hours as needed for severe pain. 09/21/14   Garald Balding, NP  pantoprazole (PROTONIX) 40 MG tablet Take 1 tablet (40 mg total) by mouth daily. 07/14/14   Tresa Garter, MD  pravastatin (PRAVACHOL) 40 MG tablet Take 1 tablet (40 mg total) by mouth every evening. 07/14/14   Tresa Garter, MD  predniSONE (DELTASONE) 20 MG tablet Take 1 tablet (20 mg total) by mouth daily with  breakfast. Patient not taking: Reported on 08/21/2014 08/02/14   Tanna Furry, MD  promethazine (PHENERGAN) 12.5 MG tablet Take 1 tablet (12.5 mg total) by mouth every 6 (six) hours as needed for nausea or vomiting. 09/21/14   Garald Balding, NP  valsartan (DIOVAN) 320 MG tablet Take 320 mg by mouth daily.    Historical Provider, MD  vitamin E (VITAMIN E) 400 UNIT capsule Take 400 Units by mouth daily.    Historical Provider, MD  VOLTAREN 1 % GEL Apply 2 g topically 2 (two) times daily as needed (pain).  06/30/13   Historical Provider, MD   BP  140/80 mmHg  Pulse 80  Temp(Src) 98 F (36.7 C) (Oral)  Wt 172 lb (78.019 kg)  SpO2 96%   Physical Exam  Constitutional: She is oriented to person, place, and time. She appears well-developed and well-nourished. No distress.  HENT:  Head: Normocephalic and atraumatic.  Eyes: Conjunctivae and EOM are normal.  Neck: Neck supple. No tracheal deviation present.  Cardiovascular: Normal rate.   Pulmonary/Chest: Effort normal. No respiratory distress.  Musculoskeletal: Normal range of motion.  Neurological: She is alert and oriented to person, place, and time.  Skin: Skin is warm and dry.  1 cm jagged superficial laceration between second and third metacarpals of left hand. Full ROM. No active bleeding.   Psychiatric: She has a normal mood and affect. Her behavior is normal.  Nursing note and vitals reviewed.   ED Course  Procedures (including critical care time)  DIAGNOSTIC STUDIES: Oxygen Saturation is 97% on RA, normal by my interpretation.    COORDINATION OF CARE: 8:15 PM-Discussed treatment plan which includes cleaning wound, steri strips and pain medication with pt at bedside and pt agreed to plan. Advised pt to continue doxycycline until it is complete.   Labs Review Labs Reviewed - No data to display  Imaging Review No results found.   EKG Interpretation None     Superficial wound was irrigated.  Steri-Strips applied, covered by  Band-Aid.  Patient started taking doxycycline since antibiotics have been added.  She's been given a prescription for OxyContin, codeine, down 5 mg every 6 hours when necessary for severe pain.  Patient has multiple drug allergies.  She will try taking just extra strength Tylenol.  She will follow-up with her primary care physician for any sign of infection MDM   Final diagnoses:  Dog bite of hand without complication, left, initial encounter       I personally performed the services described in this documentation, which was scribed in my presence. The recorded information has been reviewed and is accurate.  Garald Balding, NP 09/21/14 2040  Garald Balding, NP 09/21/14 2040  Tanna Furry, MD 09/30/14 [phone]

## 2014-09-26 DIAGNOSIS — H18831 Recurrent erosion of cornea, right eye: Secondary | ICD-10-CM | POA: Diagnosis not present

## 2014-09-26 DIAGNOSIS — H18221 Idiopathic corneal edema, right eye: Secondary | ICD-10-CM | POA: Diagnosis not present

## 2014-10-20 ENCOUNTER — Telehealth: Payer: Self-pay | Admitting: Internal Medicine

## 2014-10-20 ENCOUNTER — Other Ambulatory Visit: Payer: Self-pay | Admitting: Internal Medicine

## 2014-10-20 NOTE — Telephone Encounter (Signed)
Patient called to request a med refill for her blood pressure medications and Protonix. Please f/u with pt

## 2014-10-21 ENCOUNTER — Other Ambulatory Visit: Payer: Self-pay | Admitting: *Deleted

## 2014-10-21 MED ORDER — HYDROCHLOROTHIAZIDE 12.5 MG PO TABS: 12.5000 mg | ORAL_TABLET | Freq: Every day | ORAL | Status: DC

## 2014-11-03 ENCOUNTER — Other Ambulatory Visit: Payer: Self-pay | Admitting: *Deleted

## 2014-11-03 ENCOUNTER — Telehealth: Payer: Self-pay | Admitting: *Deleted

## 2014-11-03 ENCOUNTER — Other Ambulatory Visit: Payer: Self-pay | Admitting: Internal Medicine

## 2014-11-03 DIAGNOSIS — K219 Gastro-esophageal reflux disease without esophagitis: Secondary | ICD-10-CM

## 2014-11-03 DIAGNOSIS — I10 Essential (primary) hypertension: Secondary | ICD-10-CM

## 2014-11-03 MED ORDER — METOPROLOL TARTRATE 50 MG PO TABS: 50.0000 mg | ORAL_TABLET | Freq: Every day | ORAL | Status: DC

## 2014-11-03 MED ORDER — PANTOPRAZOLE SODIUM 40 MG PO TBEC: 40.0000 mg | DELAYED_RELEASE_TABLET | Freq: Every day | ORAL | Status: DC

## 2014-11-03 MED ORDER — PANTOPRAZOLE SODIUM 20 MG PO TBEC: 20.0000 mg | DELAYED_RELEASE_TABLET | Freq: Two times a day (BID) | ORAL | Status: DC

## 2014-11-03 NOTE — Telephone Encounter (Signed)
Rx refill send to [facility]

## 2014-11-03 NOTE — Telephone Encounter (Signed)
Pt called requesting refills for her metoprolol and Protonix.  She also asked if the could take 20mg  of Protonix in the morning and 20mg  at night. I rewrote the Protonix so she could have the medication as she requested. I sent the 2 rx to her pharmacy.

## 2014-11-03 NOTE — Telephone Encounter (Signed)
Pt called requesting medication refill on pantoprazole (PROTONIX) 40 MG tablet . Pharmacy pt uses is [facility] on [address] . Please f/u with pt .

## 2014-11-05 ENCOUNTER — Other Ambulatory Visit: Payer: Self-pay | Admitting: Pain Medicine

## 2014-11-05 ENCOUNTER — Ambulatory Visit
Admission: RE | Admit: 2014-11-05 | Discharge: 2014-11-05 | Disposition: A | Discharge status: Home / Self Care | Payer: Medicare Other | Source: Ambulatory Visit | Attending: Pain Medicine | Admitting: Pain Medicine

## 2014-11-05 DIAGNOSIS — M5416 Radiculopathy, lumbar region: Secondary | ICD-10-CM

## 2014-11-05 DIAGNOSIS — M47816 Spondylosis without myelopathy or radiculopathy, lumbar region: Secondary | ICD-10-CM | POA: Diagnosis not present

## 2014-11-05 DIAGNOSIS — M79601 Pain in right arm: Secondary | ICD-10-CM | POA: Diagnosis not present

## 2014-11-05 DIAGNOSIS — M5136 Other intervertebral disc degeneration, lumbar region: Secondary | ICD-10-CM | POA: Diagnosis not present

## 2014-11-05 DIAGNOSIS — M545 Low back pain: Secondary | ICD-10-CM | POA: Diagnosis not present

## 2014-11-10 ENCOUNTER — Telehealth: Payer: Self-pay | Admitting: Internal Medicine

## 2014-11-10 NOTE — Telephone Encounter (Signed)
Patient has come in today to inform PCP that she is unhappy with her pain management right now; patient says she has only been able to sleep for 3 hours in the last two days because of pain; patient is asking to be referred to a spine specialist; patient has dropped off paperwork from [place] for PCP to review also; please f/u with patient about her request;

## 2014-11-12 DIAGNOSIS — M5136 Other intervertebral disc degeneration, lumbar region: Secondary | ICD-10-CM | POA: Diagnosis not present

## 2014-11-12 DIAGNOSIS — M5416 Radiculopathy, lumbar region: Secondary | ICD-10-CM | POA: Diagnosis not present

## 2014-11-12 DIAGNOSIS — G894 Chronic pain syndrome: Secondary | ICD-10-CM | POA: Diagnosis not present

## 2014-11-12 DIAGNOSIS — M4696 Unspecified inflammatory spondylopathy, lumbar region: Secondary | ICD-10-CM | POA: Diagnosis not present

## 2014-11-13 ENCOUNTER — Telehealth: Payer: Self-pay | Admitting: Internal Medicine

## 2014-11-13 NOTE — Telephone Encounter (Signed)
Patient has called in today to see if she can schedule an appointment with PCP for one month out; patient was informed that PCP will be out of the office for the next couple weeks and that once his schedule was back open she would be called to schedule f/u;

## 2014-11-16 ENCOUNTER — Encounter (HOSPITAL_COMMUNITY): Payer: Self-pay | Admitting: Emergency Medicine

## 2014-11-16 ENCOUNTER — Emergency Department (HOSPITAL_COMMUNITY)
Admission: EM | Admit: 2014-11-16 | Discharge: 2014-11-16 | Disposition: A | Discharge status: Home / Self Care | Payer: Medicare Other | Attending: Emergency Medicine | Admitting: Emergency Medicine

## 2014-11-16 DIAGNOSIS — Z79899 Other long term (current) drug therapy: Secondary | ICD-10-CM | POA: Diagnosis not present

## 2014-11-16 DIAGNOSIS — K219 Gastro-esophageal reflux disease without esophagitis: Secondary | ICD-10-CM | POA: Insufficient documentation

## 2014-11-16 DIAGNOSIS — Z7982 Long term (current) use of aspirin: Secondary | ICD-10-CM | POA: Diagnosis not present

## 2014-11-16 DIAGNOSIS — Z7952 Long term (current) use of systemic steroids: Secondary | ICD-10-CM | POA: Insufficient documentation

## 2014-11-16 DIAGNOSIS — R112 Nausea with vomiting, unspecified: Secondary | ICD-10-CM | POA: Insufficient documentation

## 2014-11-16 DIAGNOSIS — G629 Polyneuropathy, unspecified: Secondary | ICD-10-CM | POA: Insufficient documentation

## 2014-11-16 DIAGNOSIS — I2510 Atherosclerotic heart disease of native coronary artery without angina pectoris: Secondary | ICD-10-CM | POA: Diagnosis not present

## 2014-11-16 DIAGNOSIS — Z7901 Long term (current) use of anticoagulants: Secondary | ICD-10-CM | POA: Diagnosis not present

## 2014-11-16 DIAGNOSIS — Z86018 Personal history of other benign neoplasm: Secondary | ICD-10-CM | POA: Insufficient documentation

## 2014-11-16 DIAGNOSIS — E876 Hypokalemia: Secondary | ICD-10-CM

## 2014-11-16 DIAGNOSIS — F410 Panic disorder [episodic paroxysmal anxiety] without agoraphobia: Secondary | ICD-10-CM | POA: Insufficient documentation

## 2014-11-16 DIAGNOSIS — Z87891 Personal history of nicotine dependence: Secondary | ICD-10-CM | POA: Insufficient documentation

## 2014-11-16 DIAGNOSIS — I10 Essential (primary) hypertension: Secondary | ICD-10-CM | POA: Insufficient documentation

## 2014-11-16 DIAGNOSIS — Z880 Allergy status to penicillin: Secondary | ICD-10-CM | POA: Diagnosis not present

## 2014-11-16 DIAGNOSIS — Z91040 Latex allergy status: Secondary | ICD-10-CM | POA: Diagnosis not present

## 2014-11-16 DIAGNOSIS — I252 Old myocardial infarction: Secondary | ICD-10-CM | POA: Diagnosis not present

## 2014-11-16 DIAGNOSIS — F329 Major depressive disorder, single episode, unspecified: Secondary | ICD-10-CM | POA: Diagnosis not present

## 2014-11-16 DIAGNOSIS — Z792 Long term (current) use of antibiotics: Secondary | ICD-10-CM | POA: Insufficient documentation

## 2014-11-16 DIAGNOSIS — Z87828 Personal history of other (healed) physical injury and trauma: Secondary | ICD-10-CM | POA: Insufficient documentation

## 2014-11-16 DIAGNOSIS — J45909 Unspecified asthma, uncomplicated: Secondary | ICD-10-CM | POA: Diagnosis not present

## 2014-11-16 HISTORY — DX: Polyneuropathy, unspecified: G62.9

## 2014-11-16 LAB — CBC WITH DIFFERENTIAL/PLATELET
Basophils Absolute: 0 10*3/uL (ref 0.0–0.1)
Basophils Relative: 0 % (ref 0–1)
Eosinophils Absolute: 0 10*3/uL (ref 0.0–0.7)
Eosinophils Relative: 1 % (ref 0–5)
HEMATOCRIT: 42.8 % (ref 36.0–46.0)
HEMOGLOBIN: 14.1 g/dL (ref 12.0–15.0)
Lymphocytes Relative: 9 % — ABNORMAL LOW (ref 12–46)
Lymphs Abs: 0.6 10*3/uL — ABNORMAL LOW (ref 0.7–4.0)
MCH: 30.1 pg (ref 26.0–34.0)
MCHC: 32.9 g/dL (ref 30.0–36.0)
MCV: 91.5 fL (ref 78.0–100.0)
MONO ABS: 0.3 10*3/uL (ref 0.1–1.0)
MONOS PCT: 4 % (ref 3–12)
NEUTROS ABS: 5.7 10*3/uL (ref 1.7–7.7)
Neutrophils Relative %: 86 % — ABNORMAL HIGH (ref 43–77)
Platelets: 166 10*3/uL (ref 150–400)
RBC: 4.68 MIL/uL (ref 3.87–5.11)
RDW: 13.5 % (ref 11.5–15.5)
WBC: 6.6 10*3/uL (ref 4.0–10.5)

## 2014-11-16 LAB — I-STAT CHEM 8, ED
BUN: 20 mg/dL (ref 6–23)
CALCIUM ION: 1.16 mmol/L (ref 1.13–1.30)
CHLORIDE: 102 mmol/L (ref 96–112)
Creatinine, Ser: 0.7 mg/dL (ref 0.50–1.10)
Glucose, Bld: 170 mg/dL — ABNORMAL HIGH (ref 70–99)
HEMATOCRIT: 44 % (ref 36.0–46.0)
HEMOGLOBIN: 15 g/dL (ref 12.0–15.0)
Potassium: 3.3 mmol/L — ABNORMAL LOW (ref 3.5–5.1)
Sodium: 142 mmol/L (ref 135–145)
TCO2: 23 mmol/L (ref 0–100)

## 2014-11-16 LAB — URINALYSIS, ROUTINE W REFLEX MICROSCOPIC
Bilirubin Urine: NEGATIVE
Glucose, UA: NEGATIVE mg/dL
Hgb urine dipstick: NEGATIVE
KETONES UR: NEGATIVE mg/dL
Leukocytes, UA: NEGATIVE
Nitrite: NEGATIVE
PH: 7.5 (ref 5.0–8.0)
Protein, ur: NEGATIVE mg/dL
SPECIFIC GRAVITY, URINE: 1.015 (ref 1.005–1.030)
UROBILINOGEN UA: 1 mg/dL (ref 0.0–1.0)

## 2014-11-16 LAB — LIPASE, BLOOD: LIPASE: 26 U/L (ref 11–59)

## 2014-11-16 LAB — COMPREHENSIVE METABOLIC PANEL
ALT: 24 U/L (ref 0–35)
AST: 29 U/L (ref 0–37)
Albumin: 4.5 g/dL (ref 3.5–5.2)
Alkaline Phosphatase: 62 U/L (ref 39–117)
Anion gap: 8 (ref 5–15)
BILIRUBIN TOTAL: 1.5 mg/dL — AB (ref 0.3–1.2)
BUN: 20 mg/dL (ref 6–23)
CO2: 26 mmol/L (ref 19–32)
Calcium: 9.6 mg/dL (ref 8.4–10.5)
Chloride: 106 mmol/L (ref 96–112)
Creatinine, Ser: 0.72 mg/dL (ref 0.50–1.10)
GFR calc Af Amer: >90 mL/min (ref >90)
GFR calc non Af Amer: >90 mL/min (ref >90)
Glucose, Bld: 166 mg/dL — ABNORMAL HIGH (ref 70–99)
POTASSIUM: 3.3 mmol/L — AB (ref 3.5–5.1)
SODIUM: 140 mmol/L (ref 135–145)
TOTAL PROTEIN: 7.3 g/dL (ref 6.0–8.3)

## 2014-11-16 LAB — I-STAT TROPONIN, ED: Troponin i, poc: 0.01 ng/mL (ref 0.00–0.08)

## 2014-11-16 LAB — I-STAT CG4 LACTIC ACID, ED: Lactic Acid, Venous: 1.88 mmol/L (ref 0.5–2.0)

## 2014-11-16 MED ORDER — MORPHINE SULFATE 4 MG/ML IJ SOLN
2.0000 mg | Freq: Once | INTRAMUSCULAR | Status: AC
Start: 2014-11-16 — End: 2014-11-16
  Administered 2014-11-16: 2 mg via INTRAVENOUS
  Filled 2014-11-16: qty 1

## 2014-11-16 MED ORDER — SODIUM CHLORIDE 0.9 % IV BOLUS (SEPSIS)
1000.0000 mL | Freq: Once | INTRAVENOUS | Status: AC
  Administered 2014-11-16: 1000 mL via INTRAVENOUS

## 2014-11-16 MED ORDER — POTASSIUM CHLORIDE CRYS ER 20 MEQ PO TBCR
20.0000 meq | EXTENDED_RELEASE_TABLET | Freq: Once | ORAL | Status: AC
  Administered 2014-11-16: 20 meq via ORAL
  Filled 2014-11-16: qty 1

## 2014-11-16 MED ORDER — ONDANSETRON 4 MG PO TBDP: 4.0000 mg | ORAL_TABLET | Freq: Three times a day (TID) | ORAL | Status: DC | PRN

## 2014-11-16 MED ORDER — ONDANSETRON HCL 4 MG/2ML IJ SOLN
4.0000 mg | Freq: Once | INTRAMUSCULAR | Status: AC
  Administered 2014-11-16: 4 mg via INTRAVENOUS
  Filled 2014-11-16: qty 2

## 2014-11-16 NOTE — ED Notes (Signed)
Requested patient to urinate. Patient does not have to at this time.

## 2014-11-16 NOTE — ED Notes (Signed)
Pt ambulated to restroom with steady gait. Pt attempting to provide urine sample.

## 2014-11-16 NOTE — Discharge Instructions (Signed)
Take the prescribed medication as directed for recurrent nausea. Follow-up with your primary care physician.   Information given for neurosurgery if you wish to call and schedule appt. Return to the ED for new or worsening symptoms.

## 2014-11-16 NOTE — ED Provider Notes (Signed)
CSN: [id number]     Arrival date & time 11/16/14  0551 History   First MD Initiated Contact with Patient 11/16/14 0606     Chief Complaint  Patient presents with  . Emesis     (Consider location/radiation/quality/duration/timing/severity/associated sxs/prior Treatment) Patient is a 64 y.o. female presenting with vomiting. The history is provided by the patient and medical records.  Emesis  This is a 64 year old female with past medical history significant for hypertension, coronary artery disease, depression, GERD, spinal stenosis, IBS, presenting to the ED for nausea and vomiting. Patient states symptoms began last night around 2200.  States she has had approximately 3 episodes of nonbloody, nonbilious emesis. She denies any diarrhea. States she has some mild abdominal discomfort which she attributes to retching.  No discomfort without retching. BMs have been normal. She is freely passing flatus.  She denies any fever or chills. She denies any chest pain or shortness of breath but states she has had some pain in her left arm.  She denies any known sick contacts or changes in diet.  Prior abdominal surgeries include cholecystectomy and partial hysterectomy.  Patient tachycardic on arrival.  Past Medical History  Diagnosis Date  . Myocardial infarct 2005  . CAD (coronary artery disease)   . Hypertension   . Long term (current) use of anticoagulants   . Edema   . Hematoma   . Sinusitis acute   . Adenocarcinoma of breast     right  . Acute upper respiratory infections of unspecified site   . Depression   . Anal fissure   . GERD (gastroesophageal reflux disease)   . Dog bite(E906.0)   . Diverticulosis of colon (without mention of hemorrhage)   . Spinal stenosis, lumbar region, without neurogenic claudication   . Anxiety   . Panic attacks   . Asthma   . Irritable bowel syndrome   . Hiatal hernia   . Jaundice     Hx of Jaundice at age 71 from "dirty restuarant". Unsure of Hepatitis  type  . Neuropathy    Past Surgical History  Procedure Laterality Date  . Partial hysterectomy  1987  . Cardiac catheterization  2001  . Coronary angioplasty with stent placement  2001  . Cholecystectomy    . Bladder repair      tact  . Breast lumpectomy Right   . Breast reconstruction Right   . Breast reduction surgery Left   . Colonoscopy  2013    Diverticulosis  . Cataract extraction    . Esophageal manometry  10/08/2012    Procedure: ESOPHAGEAL MANOMETRY (EM);  Surgeon: Sable Feil, MD;  Location: [facility];  Service: Endoscopy;  Laterality: N/A;  . Esophagogastroduodenoscopy  2014    Normal    Family History  Problem Relation Age of Onset  . Hypertension Mother   . Heart disease Mother   . Dementia Mother   . Arthritis Mother   . Diabetes Mother   . Colon polyps Mother   . Prostate cancer Father   . Hypertension Father   . Colon polyps Father   . Lung cancer Maternal Uncle   . Hypertension Sister   . Hypertension Brother   . Heart disease Sister   . Rectal cancer Neg Hx   . Stomach cancer Neg Hx   . Colon cancer Cousin   . Inflammatory bowel disease Sister    History  Substance Use Topics  . Smoking status: Former Smoker -- 0.30 packs/day for 8 years  Types: Cigarettes    Quit date: 09/13/1983  . Smokeless tobacco: Never Used  . Alcohol Use: No   OB History    No data available     Review of Systems  Gastrointestinal: Positive for nausea and vomiting.  All other systems reviewed and are negative.     Allergies  Amoxicillin; Azithromycin; Bromfed; Cephalexin; Chlordiazepoxide-clidinium; Clotrimazole; Dicyclomine hcl; Gatifloxacin; Hydralazine hcl; Ibuprofen; Iohexol; Latex; Lidocaine; Lisinopril; Librax ; Paroxetine; Penicillins; Prednisone; Pregabalin; Propoxyphene n-acetaminophen; Sertraline; Sertraline hcl; Sulfa antibiotics; Sulfadiazine; Tussionex pennkinetic er; and Verapamil  Home Medications   Prior to Admission medications    Medication Sig Start Date End Date Taking? Authorizing Provider  albuterol (PROVENTIL HFA;VENTOLIN HFA) 108 (90 BASE) MCG/ACT inhaler Inhale 2 puffs into the lungs every 4 (four) hours as needed for wheezing or shortness of breath. 07/27/14   Johnna Acosta, MD  ALPRAZolam Duanne Moron) 1 MG tablet Take 0.5-1 tablets (0.5-1 mg total) by mouth 2 (two) times daily as needed for anxiety or sleep. 07/14/14   Tresa Garter, MD  ascorbic acid (VITAMIN C) 1000 MG tablet Take 1,000 mg by mouth daily.    Historical Provider, MD  aspirin EC 81 MG tablet Take 81 mg by mouth daily.    Historical Provider, MD  Calcium Carbonate-Vitamin D (CALCIUM-CARB 600 + D) 600-125 MG-UNIT TABS Take 1 capsule by mouth daily.     Historical Provider, MD  cetirizine (ZYRTEC) 10 MG tablet Take 1 tablet (10 mg total) by mouth daily. 09/18/14   Tresa Garter, MD  cycloSPORINE (RESTASIS) 0.05 % ophthalmic emulsion Place 1 drop into both eyes 2 (two) times daily.      Historical Provider, MD  doxycycline (VIBRA-TABS) 100 MG tablet Take 1 tablet (100 mg total) by mouth 2 (two) times daily. 09/18/14   Tresa Garter, MD  Fluticasone-Salmeterol (ADVAIR) 250-50 MCG/DOSE AEPB Inhale 1 puff into the lungs 2 (two) times daily. 03/24/14   Tresa Garter, MD  gabapentin (NEURONTIN) 300 MG capsule Take 1 capsule (300 mg total) by mouth at bedtime. Take 1 cap at bedtime then 2 caps times 7 days then 3 caps qhs 08/19/13   Janece Canterbury, MD  hydrochlorothiazide (HYDRODIURIL) 12.5 MG tablet Take 1 tablet (12.5 mg total) by mouth daily. 10/21/14   Tresa Garter, MD  Ipratropium-Albuterol (COMBIVENT) 20-100 MCG/ACT AERS respimat Inhale 1 puff into the lungs every 6 (six) hours as needed for wheezing or shortness of breath. Patient not taking: Reported on 08/11/2014 12/23/13   Tresa Garter, MD  metoprolol (LOPRESSOR) 50 MG tablet Take 1 tablet (50 mg total) by mouth daily. 11/03/14   Tresa Garter, MD  oxyCODONE (OXY  IR/ROXICODONE) 5 MG immediate release tablet Take 1 tablet (5 mg total) by mouth every 6 (six) hours as needed for severe pain. 09/21/14   Garald Balding, NP  pantoprazole (PROTONIX) 20 MG tablet Take 1 tablet (20 mg total) by mouth 2 (two) times daily. 11/03/14   Tresa Garter, MD  pantoprazole (PROTONIX) 40 MG tablet Take 1 tablet (40 mg total) by mouth daily. 11/03/14   Tresa Garter, MD  pravastatin (PRAVACHOL) 40 MG tablet Take 1 tablet (40 mg total) by mouth every evening. 07/14/14   Tresa Garter, MD  predniSONE (DELTASONE) 20 MG tablet Take 1 tablet (20 mg total) by mouth daily with breakfast. Patient not taking: Reported on 08/21/2014 08/02/14   Tanna Furry, MD  promethazine (PHENERGAN) 12.5 MG tablet Take 1 tablet (12.5 mg total)  by mouth every 6 (six) hours as needed for nausea or vomiting. 09/21/14   Garald Balding, NP  valsartan (DIOVAN) 320 MG tablet Take 320 mg by mouth daily.    Historical Provider, MD  vitamin E (VITAMIN E) 400 UNIT capsule Take 400 Units by mouth daily.    Historical Provider, MD  VOLTAREN 1 % GEL Apply 2 g topically 2 (two) times daily as needed (pain).  06/30/13   Historical Provider, MD   BP 160/99 mmHg  Pulse 146  Temp(Src) 98.5 F (36.9 C) (Oral)  Resp 16  Ht 5\' 3"  (1.6 m)  Wt 173 lb (78.472 kg)  BMI 30.65 kg/m2  SpO2 97%   Physical Exam  Constitutional: She is oriented to person, place, and time. She appears well-developed and well-nourished. No distress.  HENT:  Head: Normocephalic and atraumatic.  Mouth/Throat: Oropharynx is clear and moist.  Eyes: Conjunctivae and EOM are normal. Pupils are equal, round, and reactive to light.  Neck: Normal range of motion. Neck supple.  Cardiovascular: Normal rate, regular rhythm and normal heart sounds.   Pulmonary/Chest: Effort normal and breath sounds normal. No respiratory distress. She has no wheezes.  Abdominal: Soft. Bowel sounds are normal. There is no tenderness. There is no guarding and no  CVA tenderness.  Well healed cholecystectomy scar RUQ; abdomen soft, non-distended, no focal tenderness or peritoneal signs  Musculoskeletal: Normal range of motion. She exhibits no edema.  Neurological: She is alert and oriented to person, place, and time.  Skin: Skin is warm and dry. She is not diaphoretic.  Psychiatric: She has a normal mood and affect.  Nursing note and vitals reviewed.   ED Course  Procedures (including critical care time) Labs Review Labs Reviewed  CBC WITH DIFFERENTIAL/PLATELET - Abnormal; Notable for the following:    Neutrophils Relative % 86 (*)    Lymphocytes Relative 9 (*)    Lymphs Abs 0.6 (*)    All other components within normal limits  COMPREHENSIVE METABOLIC PANEL - Abnormal; Notable for the following:    Potassium 3.3 (*)    Glucose, Bld 166 (*)    Total Bilirubin 1.5 (*)    All other components within normal limits  URINALYSIS, ROUTINE W REFLEX MICROSCOPIC - Abnormal; Notable for the following:    APPearance CLOUDY (*)    All other components within normal limits  I-STAT CHEM 8, ED - Abnormal; Notable for the following:    Potassium 3.3 (*)    Glucose, Bld 170 (*)    All other components within normal limits  LIPASE, BLOOD  I-STAT TROPOININ, ED  I-STAT CG4 LACTIC ACID, ED    Imaging Review No results found.   EKG Interpretation None      MDM   Final diagnoses:  Nausea and vomiting, vomiting of unspecified type  Hypokalemia   64 year old female here with nausea and vomiting since last night. She estimates a total of 3 episodes of nonbloody, nonbilious emesis. On exam, patient is tachycardic but overall nontoxic in appearance. Her abdominal exam is benign. She is not vomited in the past hour, but continues to endorse nausea. Will obtain labs. Fluid bolus and Zofran given.  Patient also notes chronic back pain, no deficits noted on exam today.  Labwork as above, mild hypokalemia noted which was replaced in the ED. After fluids her  heart rate has significantly improved from time of arrival. Suspect this is partially due to dehydration, no chest pain or SOB to suggest PE.  She remains  without any active vomiting and states her nausea has resolved. She has tolerated PO fluids without difficulty.  Repeat abdominal exam remains benign. At this time, low suspicion for acute or surgical abdomen including small bowel obstruction, cholecystitis, bowel perforation, appendicitis, diverticulitis.  Patient be discharged home with supportive care.  Encouraged to FU with PCP and gastroenterologist. Patient also requests a neurosurgeon for further evaluation of her chronic back pain which was provided.  Discussed plan with patient, he/she acknowledged understanding and agreed with plan of care.  Return precautions given for new or worsening symptoms.  Case discussed with attending physician, Dr. Zenia Resides, who evaluated patient and agrees with assessment and plan of care.   Larene Pickett, PA-C 11/16/14 1306  Everlene Balls, MD 11/16/14 [phone]

## 2014-11-16 NOTE — ED Notes (Signed)
Ice chips provided.  Warm pack provided for chronic back pain.

## 2014-11-16 NOTE — ED Provider Notes (Signed)
Medical screening examination/treatment/procedure(s) were conducted as a shared visit with non-physician practitioner(s) and myself.  I personally evaluated the patient during the encounter.   EKG Interpretation   Date/Time:  Sunday November 16 2014 06:12:48 EST Ventricular Rate:  126 PR Interval:  147 QRS Duration: 81 QT Interval:  316 QTC Calculation: 457 R Axis:   63 Text Interpretation:  Sinus tachycardia Baseline wander in lead(s) V4  tachycardia now present Confirmed by Glynn Octave [phone]) on  11/16/2014 6:25:39 AM     Patient here complaining of persistent abdominal pain similar to her prior episodes. Labs reviewed in evidence of acute pathology. Abdominal exam is benign. Mildly tachycardic and given IV fluids and medications for this and she feels better and will follow-up with her gastroenterologist  Leota Jacobsen, MD 11/16/14 [phone]

## 2014-11-16 NOTE — ED Notes (Signed)
Patient c/o vomiting. States she started vomiting at 2200. Patient states she is unable to tolerate POs. Patient has had 3 episodes of emesis.

## 2014-11-17 ENCOUNTER — Encounter (HOSPITAL_COMMUNITY): Payer: Self-pay | Admitting: Emergency Medicine

## 2014-11-17 ENCOUNTER — Emergency Department (HOSPITAL_COMMUNITY)
Admission: EM | Admit: 2014-11-17 | Discharge: 2014-11-18 | Disposition: A | Discharge status: Home / Self Care | Payer: Medicare Other | Attending: Emergency Medicine | Admitting: Emergency Medicine

## 2014-11-17 ENCOUNTER — Telehealth: Payer: Self-pay | Admitting: Internal Medicine

## 2014-11-17 DIAGNOSIS — Z9889 Other specified postprocedural states: Secondary | ICD-10-CM | POA: Insufficient documentation

## 2014-11-17 DIAGNOSIS — Z79899 Other long term (current) drug therapy: Secondary | ICD-10-CM | POA: Diagnosis not present

## 2014-11-17 DIAGNOSIS — I10 Essential (primary) hypertension: Secondary | ICD-10-CM | POA: Insufficient documentation

## 2014-11-17 DIAGNOSIS — Z91040 Latex allergy status: Secondary | ICD-10-CM | POA: Insufficient documentation

## 2014-11-17 DIAGNOSIS — I2510 Atherosclerotic heart disease of native coronary artery without angina pectoris: Secondary | ICD-10-CM | POA: Insufficient documentation

## 2014-11-17 DIAGNOSIS — K219 Gastro-esophageal reflux disease without esophagitis: Secondary | ICD-10-CM | POA: Insufficient documentation

## 2014-11-17 DIAGNOSIS — I252 Old myocardial infarction: Secondary | ICD-10-CM | POA: Insufficient documentation

## 2014-11-17 DIAGNOSIS — F329 Major depressive disorder, single episode, unspecified: Secondary | ICD-10-CM | POA: Diagnosis not present

## 2014-11-17 DIAGNOSIS — F410 Panic disorder [episodic paroxysmal anxiety] without agoraphobia: Secondary | ICD-10-CM | POA: Insufficient documentation

## 2014-11-17 DIAGNOSIS — Z90710 Acquired absence of both cervix and uterus: Secondary | ICD-10-CM | POA: Diagnosis not present

## 2014-11-17 DIAGNOSIS — R197 Diarrhea, unspecified: Secondary | ICD-10-CM | POA: Diagnosis not present

## 2014-11-17 DIAGNOSIS — Z792 Long term (current) use of antibiotics: Secondary | ICD-10-CM | POA: Diagnosis not present

## 2014-11-17 DIAGNOSIS — Z87891 Personal history of nicotine dependence: Secondary | ICD-10-CM | POA: Diagnosis not present

## 2014-11-17 DIAGNOSIS — E876 Hypokalemia: Secondary | ICD-10-CM

## 2014-11-17 DIAGNOSIS — Z880 Allergy status to penicillin: Secondary | ICD-10-CM | POA: Diagnosis not present

## 2014-11-17 DIAGNOSIS — J45909 Unspecified asthma, uncomplicated: Secondary | ICD-10-CM | POA: Insufficient documentation

## 2014-11-17 DIAGNOSIS — Z872 Personal history of diseases of the skin and subcutaneous tissue: Secondary | ICD-10-CM | POA: Diagnosis not present

## 2014-11-17 LAB — URINALYSIS, ROUTINE W REFLEX MICROSCOPIC
Bilirubin Urine: NEGATIVE
GLUCOSE, UA: NEGATIVE mg/dL
HGB URINE DIPSTICK: NEGATIVE
Ketones, ur: NEGATIVE mg/dL
Leukocytes, UA: NEGATIVE
Nitrite: NEGATIVE
PH: 6.5 (ref 5.0–8.0)
PROTEIN: NEGATIVE mg/dL
SPECIFIC GRAVITY, URINE: 1.004 — AB (ref 1.005–1.030)
UROBILINOGEN UA: 1 mg/dL (ref 0.0–1.0)

## 2014-11-17 MED ORDER — SODIUM CHLORIDE 0.9 % IV BOLUS (SEPSIS)
1000.0000 mL | Freq: Once | INTRAVENOUS | Status: AC
  Administered 2014-11-18: 1000 mL via INTRAVENOUS

## 2014-11-17 NOTE — ED Notes (Signed)
Pt states she has had diarrhea since 430 this morning  Pt states she has taken immodium today without relief  Pt states she has had only liquids today   Pt was seen here yesterday morning for vomiting and pain management for her chronic back pain  Pt is scheduled to have an epidural steroid injection on Friday

## 2014-11-17 NOTE — Telephone Encounter (Signed)
Pt is requesting a refill for:  ALPRAZolam (XANAX) 1 MG tablet  Please follow up with pt.

## 2014-11-17 NOTE — Telephone Encounter (Signed)
Patient called to request a med refill for ALPRAZolam Duanne Moron) 1 MG tablet. Please f/u with pt.

## 2014-11-18 DIAGNOSIS — R197 Diarrhea, unspecified: Secondary | ICD-10-CM | POA: Diagnosis not present

## 2014-11-18 LAB — COMPREHENSIVE METABOLIC PANEL
ALBUMIN: 4.3 g/dL (ref 3.5–5.2)
ALT: 34 U/L (ref 0–35)
ANION GAP: 3 — AB (ref 5–15)
AST: 50 U/L — ABNORMAL HIGH (ref 0–37)
Alkaline Phosphatase: 63 U/L (ref 39–117)
BUN: 6 mg/dL (ref 6–23)
CALCIUM: 9.1 mg/dL (ref 8.4–10.5)
CHLORIDE: 109 mmol/L (ref 96–112)
CO2: 28 mmol/L (ref 19–32)
CREATININE: 0.64 mg/dL (ref 0.50–1.10)
GFR calc Af Amer: >90 mL/min (ref >90)
GFR calc non Af Amer: >90 mL/min (ref >90)
Glucose, Bld: 116 mg/dL — ABNORMAL HIGH (ref 70–99)
Potassium: 3 mmol/L — ABNORMAL LOW (ref 3.5–5.1)
Sodium: 140 mmol/L (ref 135–145)
Total Bilirubin: 1.3 mg/dL — ABNORMAL HIGH (ref 0.3–1.2)
Total Protein: 6.8 g/dL (ref 6.0–8.3)

## 2014-11-18 LAB — CBC WITH DIFFERENTIAL/PLATELET
Basophils Absolute: 0 10*3/uL (ref 0.0–0.1)
Basophils Relative: 1 % (ref 0–1)
EOS ABS: 0 10*3/uL (ref 0.0–0.7)
Eosinophils Relative: 1 % (ref 0–5)
HCT: 38 % (ref 36.0–46.0)
HEMOGLOBIN: 12.4 g/dL (ref 12.0–15.0)
LYMPHS PCT: 30 % (ref 12–46)
Lymphs Abs: 1.2 10*3/uL (ref 0.7–4.0)
MCH: 30 pg (ref 26.0–34.0)
MCHC: 32.6 g/dL (ref 30.0–36.0)
MCV: 91.8 fL (ref 78.0–100.0)
MONOS PCT: 10 % (ref 3–12)
Monocytes Absolute: 0.4 10*3/uL (ref 0.1–1.0)
NEUTROS PCT: 59 % (ref 43–77)
Neutro Abs: 2.4 10*3/uL (ref 1.7–7.7)
PLATELETS: 140 10*3/uL — AB (ref 150–400)
RBC: 4.14 MIL/uL (ref 3.87–5.11)
RDW: 13.2 % (ref 11.5–15.5)
WBC: 4.1 10*3/uL (ref 4.0–10.5)

## 2014-11-18 MED ORDER — ACETAMINOPHEN 500 MG PO TABS
1000.0000 mg | ORAL_TABLET | Freq: Once | ORAL | Status: AC
  Administered 2014-11-18: 1000 mg via ORAL
  Filled 2014-11-18: qty 2

## 2014-11-18 MED ORDER — POTASSIUM CHLORIDE CRYS ER 20 MEQ PO TBCR
40.0000 meq | EXTENDED_RELEASE_TABLET | Freq: Once | ORAL | Status: AC
  Administered 2014-11-18: 40 meq via ORAL
  Filled 2014-11-18: qty 2

## 2014-11-18 MED ORDER — POTASSIUM CHLORIDE CRYS ER 20 MEQ PO TBCR: 20.0000 meq | EXTENDED_RELEASE_TABLET | Freq: Once | ORAL | Status: DC

## 2014-11-18 NOTE — ED Provider Notes (Signed)
CSN: [id number]     Arrival date & time 11/17/14  1917 History   First MD Initiated Contact with Patient 11/17/14 2305     Chief Complaint  Patient presents with  . Diarrhea     (Consider location/radiation/quality/duration/timing/severity/associated sxs/prior Treatment) HPI 64 year old female presents to the emergency department from home with complaint of diarrhea since 4:30 this morning.  Patient was seen in the emergency department yesterday for nausea and vomiting.  Patient reports that she has had loud bowel sounds and much gas today.  She reports that everything that she eats today causes her to have loose stools.  She has taken Imodium without improvement.  No travel, no unusual foods, no sick contacts. Past Medical History  Diagnosis Date  . Myocardial infarct 2005  . CAD (coronary artery disease)   . Hypertension   . Long term (current) use of anticoagulants   . Edema   . Hematoma   . Sinusitis acute   . Adenocarcinoma of breast     right  . Acute upper respiratory infections of unspecified site   . Depression   . Anal fissure   . GERD (gastroesophageal reflux disease)   . Dog bite(E906.0)   . Diverticulosis of colon (without mention of hemorrhage)   . Spinal stenosis, lumbar region, without neurogenic claudication   . Anxiety   . Panic attacks   . Asthma   . Irritable bowel syndrome   . Hiatal hernia   . Jaundice     Hx of Jaundice at age 23 from "dirty restuarant". Unsure of Hepatitis type  . Neuropathy    Past Surgical History  Procedure Laterality Date  . Partial hysterectomy  1987  . Cardiac catheterization  2001  . Coronary angioplasty with stent placement  2001  . Cholecystectomy    . Bladder repair      tact  . Breast lumpectomy Right   . Breast reconstruction Right   . Breast reduction surgery Left   . Colonoscopy  2013    Diverticulosis  . Cataract extraction    . Esophageal manometry  10/08/2012    Procedure: ESOPHAGEAL MANOMETRY (EM);   Surgeon: Sable Feil, MD;  Location: [facility];  Service: Endoscopy;  Laterality: N/A;  . Esophagogastroduodenoscopy  2014    Normal    Family History  Problem Relation Age of Onset  . Hypertension Mother   . Heart disease Mother   . Dementia Mother   . Arthritis Mother   . Diabetes Mother   . Colon polyps Mother   . Prostate cancer Father   . Hypertension Father   . Colon polyps Father   . Lung cancer Maternal Uncle   . Hypertension Sister   . Hypertension Brother   . Heart disease Sister   . Rectal cancer Neg Hx   . Stomach cancer Neg Hx   . Colon cancer Cousin   . Inflammatory bowel disease Sister    History  Substance Use Topics  . Smoking status: Former Smoker -- 0.30 packs/day for 8 years    Types: Cigarettes    Quit date: 09/13/1983  . Smokeless tobacco: Never Used  . Alcohol Use: No   OB History    No data available     Review of Systems  See History of Present Illness; otherwise all other systems are reviewed and negative   Allergies  Amoxicillin; Azithromycin; Bromfed; Cephalexin; Chlordiazepoxide-clidinium; Clotrimazole; Dicyclomine hcl; Gatifloxacin; Hydralazine hcl; Ibuprofen; Iohexol; Latex; Lidocaine; Lisinopril; Librax ; Paroxetine; Penicillins; Prednisone; Pregabalin;  Propoxyphene n-acetaminophen; Sertraline; Sertraline hcl; Sulfa antibiotics; Sulfadiazine; Tussionex pennkinetic er; and Verapamil  Home Medications   Prior to Admission medications   Medication Sig Start Date End Date Taking? Authorizing Provider  ALPRAZolam Duanne Moron) 1 MG tablet Take 0.5-1 tablets (0.5-1 mg total) by mouth 2 (two) times daily as needed for anxiety or sleep. 07/14/14  Yes Tresa Garter, MD  ascorbic acid (VITAMIN C) 1000 MG tablet Take 1,000 mg by mouth daily.   Yes Historical Provider, MD  aspirin EC 81 MG tablet Take 81 mg by mouth daily.   Yes Historical Provider, MD  Calcium Carbonate-Vitamin D (CALCIUM-CARB 600 + D) 600-125 MG-UNIT TABS Take 1 capsule  by mouth daily.    Yes Historical Provider, MD  cetirizine (ZYRTEC) 10 MG tablet Take 1 tablet (10 mg total) by mouth daily. 09/18/14  Yes Tresa Garter, MD  cycloSPORINE (RESTASIS) 0.05 % ophthalmic emulsion Place 1 drop into both eyes 2 (two) times daily.     Yes Historical Provider, MD  Fluticasone-Salmeterol (ADVAIR) 250-50 MCG/DOSE AEPB Inhale 1 puff into the lungs 2 (two) times daily. Patient taking differently: Inhale 1 puff into the lungs 2 (two) times daily as needed.  03/24/14  Yes Tresa Garter, MD  gabapentin (NEURONTIN) 300 MG capsule Take 1 capsule (300 mg total) by mouth at bedtime. Take 1 cap at bedtime then 2 caps times 7 days then 3 caps qhs Patient taking differently: Take 600 mg by mouth at bedtime. Take 1 cap at bedtime then 2 caps times 7 days then 3 caps qhs 08/19/13  Yes Janece Canterbury, MD  hydrochlorothiazide (HYDRODIURIL) 12.5 MG tablet Take 1 tablet (12.5 mg total) by mouth daily. 10/21/14  Yes Tresa Garter, MD  loperamide (IMODIUM) 2 MG capsule Take 2 mg by mouth as needed for diarrhea or loose stools.   Yes Historical Provider, MD  metoprolol (LOPRESSOR) 50 MG tablet Take 1 tablet (50 mg total) by mouth daily. 11/03/14  Yes Tresa Garter, MD  ondansetron (ZOFRAN ODT) 4 MG disintegrating tablet Take 1 tablet (4 mg total) by mouth every 8 (eight) hours as needed for nausea. 11/16/14  Yes Larene Pickett, PA-C  pravastatin (PRAVACHOL) 40 MG tablet Take 1 tablet (40 mg total) by mouth every evening. 07/14/14  Yes Tresa Garter, MD  valsartan (DIOVAN) 320 MG tablet Take 320 mg by mouth daily.   Yes Historical Provider, MD  vitamin E (VITAMIN E) 400 UNIT capsule Take 400 Units by mouth daily.   Yes Historical Provider, MD  VOLTAREN 1 % GEL Apply 2 g topically 2 (two) times daily as needed (pain).  06/30/13  Yes Historical Provider, MD  albuterol (PROVENTIL HFA;VENTOLIN HFA) 108 (90 BASE) MCG/ACT inhaler Inhale 2 puffs into the lungs every 4 (four) hours as  needed for wheezing or shortness of breath. 07/27/14   Noemi Chapel, MD  doxycycline (VIBRA-TABS) 100 MG tablet Take 1 tablet (100 mg total) by mouth 2 (two) times daily. Patient not taking: Reported on 11/16/2014 09/18/14   Tresa Garter, MD  Ipratropium-Albuterol (COMBIVENT) 20-100 MCG/ACT AERS respimat Inhale 1 puff into the lungs every 6 (six) hours as needed for wheezing or shortness of breath. Patient not taking: Reported on 08/11/2014 12/23/13   Tresa Garter, MD  methocarbamol (ROBAXIN) 750 MG tablet Take 750 mg by mouth every 8 (eight) hours as needed for muscle spasms.    Historical Provider, MD  oxyCODONE (OXY IR/ROXICODONE) 5 MG immediate release tablet Take 1 tablet (  5 mg total) by mouth every 6 (six) hours as needed for severe pain. Patient not taking: Reported on 11/16/2014 09/21/14   Junius Creamer, NP  pantoprazole (PROTONIX) 20 MG tablet Take 1 tablet (20 mg total) by mouth 2 (two) times daily. Patient not taking: Reported on 11/16/2014 11/03/14   Tresa Garter, MD  pantoprazole (PROTONIX) 40 MG tablet Take 1 tablet (40 mg total) by mouth daily. 11/03/14   Tresa Garter, MD  predniSONE (DELTASONE) 20 MG tablet Take 1 tablet (20 mg total) by mouth daily with breakfast. Patient not taking: Reported on 08/21/2014 08/02/14   Tanna Furry, MD  promethazine (PHENERGAN) 12.5 MG tablet Take 1 tablet (12.5 mg total) by mouth every 6 (six) hours as needed for nausea or vomiting. Patient not taking: Reported on 11/16/2014 09/21/14   Junius Creamer, NP   BP 149/80 mmHg  Pulse 92  Temp(Src) 99.2 F (37.3 C) (Oral)  Resp 18  Ht 5\' 2"  (1.575 m)  Wt 172 lb (78.019 kg)  BMI 31.45 kg/m2  SpO2 96% Physical Exam  Constitutional: She is oriented to person, place, and time. She appears well-developed and well-nourished.  HENT:  Head: Normocephalic and atraumatic.  Nose: Nose normal.  Mouth/Throat: Oropharynx is clear and moist.  Eyes: Conjunctivae and EOM are normal. Pupils are equal,  round, and reactive to light.  Neck: Normal range of motion. Neck supple. No JVD present. No tracheal deviation present. No thyromegaly present.  Cardiovascular: Normal rate, regular rhythm, normal heart sounds and intact distal pulses.  Exam reveals no gallop and no friction rub.   No murmur heard. Pulmonary/Chest: Effort normal and breath sounds normal. No stridor. No respiratory distress. She has no wheezes. She has no rales. She exhibits no tenderness.  Abdominal: Soft. She exhibits no distension and no mass. There is no tenderness. There is no rebound and no guarding.  Hyperactive bowel sounds  Musculoskeletal: Normal range of motion. She exhibits no edema or tenderness.  Lymphadenopathy:    She has no cervical adenopathy.  Neurological: She is alert and oriented to person, place, and time. She displays normal reflexes. She exhibits normal muscle tone. Coordination normal.  Skin: Skin is warm and dry. No rash noted. No erythema. No pallor.  Psychiatric: She has a normal mood and affect. Her behavior is normal. Judgment and thought content normal.  Nursing note and vitals reviewed.   ED Course  Procedures (including critical care time) Labs Review Labs Reviewed  COMPREHENSIVE METABOLIC PANEL - Abnormal; Notable for the following:    Potassium 3.0 (*)    Glucose, Bld 116 (*)    AST 50 (*)    Total Bilirubin 1.3 (*)    Anion gap 3 (*)    All other components within normal limits  CBC WITH DIFFERENTIAL/PLATELET - Abnormal; Notable for the following:    Platelets 140 (*)    All other components within normal limits  URINALYSIS, ROUTINE W REFLEX MICROSCOPIC - Abnormal; Notable for the following:    Specific Gravity, Urine 1.004 (*)    All other components within normal limits    Imaging Review No results found.   EKG Interpretation None      MDM   Final diagnoses:  Diarrhea  Hypokalemia    64 year old female with diarrhea today.  Most likely patient has  gastroenteritis that she had nausea and vomiting yesterday.  Clinically appears well.  Plan for IV fluids.  Will check labs.    Linton Flemings, MD 11/18/14 [phone]

## 2014-11-18 NOTE — ED Notes (Signed)
Potassium tablet dropped on the floor.  Dr. Sharol Given made aware.  New order received.  Patient has now received a total 55mEq Potassium.

## 2014-11-18 NOTE — Discharge Instructions (Signed)
Diarrhea Diarrhea is frequent loose and watery bowel movements. It can cause you to feel weak and dehydrated. Dehydration can cause you to become tired and thirsty, have a dry mouth, and have decreased urination that often is dark yellow. Diarrhea is a sign of another problem, most often an infection that will not last long. In most cases, diarrhea typically lasts 2-3 days. However, it can last longer if it is a sign of something more serious. It is important to treat your diarrhea as directed by your caregiver to lessen or prevent future episodes of diarrhea. CAUSES  Some common causes include:  Gastrointestinal infections caused by viruses, bacteria, or parasites.  Food poisoning or food allergies.  Certain medicines, such as antibiotics, chemotherapy, and laxatives.  Artificial sweeteners and fructose.  Digestive disorders. HOME CARE INSTRUCTIONS  Ensure adequate fluid intake (hydration): Have 1 cup (8 oz) of fluid for each diarrhea episode. Avoid fluids that contain simple sugars or sports drinks, fruit juices, whole milk products, and sodas. Your urine should be clear or pale yellow if you are drinking enough fluids. Hydrate with an oral rehydration solution that you can purchase at pharmacies, retail stores, and online. You can prepare an oral rehydration solution at home by mixing the following ingredients together:   - tsp table salt.   tsp baking soda.   tsp salt substitute containing potassium chloride.  1  tablespoons sugar.  1 L (34 oz) of water.  Certain foods and beverages may increase the speed at which food moves through the gastrointestinal (GI) tract. These foods and beverages should be avoided and include:  Caffeinated and alcoholic beverages.  High-fiber foods, such as raw fruits and vegetables, nuts, seeds, and whole grain breads and cereals.  Foods and beverages sweetened with sugar alcohols, such as xylitol, sorbitol, and mannitol.  Some foods may be well  tolerated and may help thicken stool including:  Starchy foods, such as rice, toast, pasta, low-sugar cereal, oatmeal, grits, baked potatoes, crackers, and bagels.  Bananas.  Applesauce.  Add probiotic-rich foods to help increase healthy bacteria in the GI tract, such as yogurt and fermented milk products.  Wash your hands well after each diarrhea episode.  Only take over-the-counter or prescription medicines as directed by your caregiver.  Take a warm bath to relieve any burning or pain from frequent diarrhea episodes. SEEK IMMEDIATE MEDICAL CARE IF:   You are unable to keep fluids down.  You have persistent vomiting.  You have blood in your stool, or your stools are black and tarry.  You do not urinate in 6-8 hours, or there is only a small amount of very dark urine.  You have abdominal pain that increases or localizes.  You have weakness, dizziness, confusion, or light-headedness.  You have a severe headache.  Your diarrhea gets worse or does not get better.  You have a fever or persistent symptoms for more than 2-3 days.  You have a fever and your symptoms suddenly get worse. MAKE SURE YOU:   Understand these instructions.  Will watch your condition.  Will get help right away if you are not doing well or get worse. Document Released: 08/19/2002 Document Revised: 01/13/2014 Document Reviewed: 05/06/2012 [facility] Patient Information 2015 [place], [place]. This information is not intended to replace advice given to you by your health care provider. Make sure you discuss any questions you have with your health care provider.  Food Choices to Help Relieve Diarrhea When you have diarrhea, the foods you eat and your  eating habits are very important. Choosing the right foods and drinks can help relieve diarrhea. Also, because diarrhea can last up to 7 days, you need to replace lost fluids and electrolytes (such as sodium, potassium, and chloride) in order to help prevent  dehydration.  WHAT GENERAL GUIDELINES DO I NEED TO FOLLOW?  Slowly drink 1 cup (8 oz) of fluid for each episode of diarrhea. If you are getting enough fluid, your urine will be clear or pale yellow.  Eat starchy foods. Some good choices include white rice, white toast, pasta, low-fiber cereal, baked potatoes (without the skin), saltine crackers, and bagels.  Avoid large servings of any cooked vegetables.  Limit fruit to two servings per day. A serving is  cup or 1 small piece.  Choose foods with less than 2 g of fiber per serving.  Limit fats to less than 8 tsp (38 g) per day.  Avoid fried foods.  Eat foods that have probiotics in them. Probiotics can be found in certain dairy products.  Avoid foods and beverages that may increase the speed at which food moves through the stomach and intestines (gastrointestinal tract). Things to avoid include:  High-fiber foods, such as dried fruit, raw fruits and vegetables, nuts, seeds, and whole grain foods.  Spicy foods and high-fat foods.  Foods and beverages sweetened with high-fructose corn syrup, honey, or sugar alcohols such as xylitol, sorbitol, and mannitol. WHAT FOODS ARE RECOMMENDED? Grains White rice. White, [place], or pita breads (fresh or toasted), including plain rolls, buns, or bagels. White pasta. Saltine, soda, or graham crackers. Pretzels. Low-fiber cereal. Cooked cereals made with water (such as cornmeal, farina, or cream cereals). Plain muffins. Matzo. Melba toast. Zwieback.  Vegetables Potatoes (without the skin). Strained tomato and vegetable juices. Most well-cooked and canned vegetables without seeds. Tender lettuce. Fruits Cooked or canned applesauce, apricots, cherries, fruit cocktail, grapefruit, peaches, pears, or plums. Fresh bananas, apples without skin, cherries, grapes, cantaloupe, grapefruit, peaches, oranges, or plums.  Meat and Other Protein Products Baked or boiled chicken. Eggs. Tofu. Fish. Seafood. Smooth  peanut butter. Ground or well-cooked tender beef, ham, veal, lamb, pork, or poultry.  Dairy Plain yogurt, kefir, and unsweetened liquid yogurt. Lactose-free milk, buttermilk, or soy milk. Plain hard cheese. Beverages Sport drinks. Clear broths. Diluted fruit juices (except prune). Regular, caffeine-free sodas such as ginger ale. Water. Decaffeinated teas. Oral rehydration solutions. Sugar-free beverages not sweetened with sugar alcohols. Other Bouillon, broth, or soups made from recommended foods.  The items listed above may not be a complete list of recommended foods or beverages. Contact your dietitian for more options. WHAT FOODS ARE NOT RECOMMENDED? Grains Whole grain, whole wheat, bran, or rye breads, rolls, pastas, crackers, and cereals. Wild or brown rice. Cereals that contain more than 2 g of fiber per serving. Corn tortillas or taco shells. Cooked or dry oatmeal. Granola. Popcorn. Vegetables Raw vegetables. Cabbage, broccoli, Brussels sprouts, artichokes, baked beans, beet greens, corn, kale, legumes, peas, sweet potatoes, and yams. Potato skins. Cooked spinach and cabbage. Fruits Dried fruit, including raisins and dates. Raw fruits. Stewed or dried prunes. Fresh apples with skin, apricots, mangoes, pears, raspberries, and strawberries.  Meat and Other Protein Products Chunky peanut butter. Nuts and seeds. Beans and lentils. Berniece Salines.  Dairy High-fat cheeses. Milk, chocolate milk, and beverages made with milk, such as milk shakes. Cream. Ice cream. Sweets and Desserts Sweet rolls, doughnuts, and sweet breads. Pancakes and waffles. Fats and Oils Butter. Cream sauces. Margarine. Salad oils. Plain salad dressings. Olives.  Avocados.  Beverages Caffeinated beverages (such as coffee, tea, soda, or energy drinks). Alcoholic beverages. Fruit juices with pulp. Prune juice. Soft drinks sweetened with high-fructose corn syrup or sugar alcohols. Other Coconut. Hot sauce. Chili powder. Mayonnaise.  Gravy. Cream-based or milk-based soups.  The items listed above may not be a complete list of foods and beverages to avoid. Contact your dietitian for more information. WHAT SHOULD I DO IF I BECOME DEHYDRATED? Diarrhea can sometimes lead to dehydration. Signs of dehydration include dark urine and dry mouth and skin. If you think you are dehydrated, you should rehydrate with an oral rehydration solution. These solutions can be purchased at pharmacies, retail stores, or online.  Drink -1 cup (120-240 mL) of oral rehydration solution each time you have an episode of diarrhea. If drinking this amount makes your diarrhea worse, try drinking smaller amounts more often. For example, drink 1-3 tsp (5-15 mL) every 5-10 minutes.  A general rule for staying hydrated is to drink 1-2 L of fluid per day. Talk to your health care provider about the specific amount you should be drinking each day. Drink enough fluids to keep your urine clear or pale yellow. Document Released: 11/19/2003 Document Revised: 09/03/2013 Document Reviewed: 07/22/2013 [facility] Patient Information 2015 [place], [place]. This information is not intended to replace advice given to you by your health care provider. Make sure you discuss any questions you have with your health care provider.  Hypokalemia Hypokalemia means that the amount of potassium in the blood is lower than normal.Potassium is a chemical, called an electrolyte, that helps regulate the amount of fluid in the body. It also stimulates muscle contraction and helps nerves function properly.Most of the body's potassium is inside of cells, and only a very small amount is in the blood. Because the amount in the blood is so small, minor changes can be life-threatening. CAUSES  Antibiotics.  Diarrhea or vomiting.  Using laxatives too much, which can cause diarrhea.  Chronic kidney disease.  Water pills (diuretics).  Eating disorders (bulimia).  Low magnesium  level.  Sweating a lot. SIGNS AND SYMPTOMS  Weakness.  Constipation.  Fatigue.  Muscle cramps.  Mental confusion.  Skipped heartbeats or irregular heartbeat (palpitations).  Tingling or numbness. DIAGNOSIS  Your health care provider can diagnose hypokalemia with blood tests. In addition to checking your potassium level, your health care provider may also check other lab tests. TREATMENT Hypokalemia can be treated with potassium supplements taken by mouth or adjustments in your current medicines. If your potassium level is very low, you may need to get potassium through a vein (IV) and be monitored in the hospital. A diet high in potassium is also helpful. Foods high in potassium are:  Nuts, such as peanuts and pistachios.  Seeds, such as sunflower seeds and pumpkin seeds.  Peas, lentils, and lima beans.  Whole grain and bran cereals and breads.  Fresh fruit and vegetables, such as apricots, avocado, bananas, cantaloupe, kiwi, oranges, tomatoes, asparagus, and potatoes.  Orange and tomato juices.  Red meats.  Fruit yogurt. HOME CARE INSTRUCTIONS  Take all medicines as prescribed by your health care provider.  Maintain a healthy diet by including nutritious food, such as fruits, vegetables, nuts, whole grains, and lean meats.  If you are taking a laxative, be sure to follow the directions on the label. SEEK MEDICAL CARE IF:  Your weakness gets worse.  You feel your heart pounding or racing.  You are vomiting or having diarrhea.  You are diabetic  and having trouble keeping your blood glucose in the normal range. SEEK IMMEDIATE MEDICAL CARE IF:  You have chest pain, shortness of breath, or dizziness.  You are vomiting or having diarrhea for more than 2 days.  You faint. MAKE SURE YOU:   Understand these instructions.  Will watch your condition.  Will get help right away if you are not doing well or get worse. Document Released: 08/29/2005 Document  Revised: 06/19/2013 Document Reviewed: 03/01/2013 [facility] Patient Information 2015 [place], [place]. This information is not intended to replace advice given to you by your health care provider. Make sure you discuss any questions you have with your health care provider.  Viral Infections A virus is a type of germ. Viruses can cause:  Minor sore throats.  Aches and pains.  Headaches.  Runny nose.  Rashes.  Watery eyes.  Tiredness.  Coughs.  Loss of appetite.  Feeling sick to your stomach (nausea).  Throwing up (vomiting).  Watery poop (diarrhea). HOME CARE   Only take medicines as told by your doctor.  Drink enough water and fluids to keep your pee (urine) clear or pale yellow. Sports drinks are a good choice.  Get plenty of rest and eat healthy. Soups and broths with crackers or rice are fine. GET HELP RIGHT AWAY IF:   You have a very bad headache.  You have shortness of breath.  You have chest pain or neck pain.  You have an unusual rash.  You cannot stop throwing up.  You have watery poop that does not stop.  You cannot keep fluids down.  You or your child has a temperature by mouth above 102 F (38.9 C), not controlled by medicine.  Your baby is older than 3 months with a rectal temperature of 102 F (38.9 C) or higher.  Your baby is 61 months old or younger with a rectal temperature of 100.4 F (38 C) or higher. MAKE SURE YOU:   Understand these instructions.  Will watch this condition.  Will get help right away if you are not doing well or get worse. Document Released: 08/11/2008 Document Revised: 11/21/2011 Document Reviewed: 01/04/2011 [facility] Patient Information 2015 [place], [place]. This information is not intended to replace advice given to you by your health care provider. Make sure you discuss any questions you have with your health care provider.

## 2014-11-19 ENCOUNTER — Telehealth: Payer: Self-pay | Admitting: Gastroenterology

## 2014-11-19 NOTE — Telephone Encounter (Signed)
Patient prefers Dr. Hilarie Fredrickson as new GI MD. Patient states she has had problems with GERD. She was seen in ED x 2 this week for N,V,D. Scheduled with Tye Savoy, NP on 11/26/14 at 1:30 PM.

## 2014-11-21 DIAGNOSIS — Z882 Allergy status to sulfonamides status: Secondary | ICD-10-CM | POA: Diagnosis not present

## 2014-11-21 DIAGNOSIS — Z9049 Acquired absence of other specified parts of digestive tract: Secondary | ICD-10-CM | POA: Diagnosis not present

## 2014-11-21 DIAGNOSIS — Z7982 Long term (current) use of aspirin: Secondary | ICD-10-CM | POA: Diagnosis not present

## 2014-11-21 DIAGNOSIS — Z853 Personal history of malignant neoplasm of breast: Secondary | ICD-10-CM | POA: Diagnosis not present

## 2014-11-21 DIAGNOSIS — M5417 Radiculopathy, lumbosacral region: Secondary | ICD-10-CM | POA: Diagnosis not present

## 2014-11-21 DIAGNOSIS — Z87891 Personal history of nicotine dependence: Secondary | ICD-10-CM | POA: Diagnosis not present

## 2014-11-21 DIAGNOSIS — Z880 Allergy status to penicillin: Secondary | ICD-10-CM | POA: Diagnosis not present

## 2014-11-21 DIAGNOSIS — Z888 Allergy status to other drugs, medicaments and biological substances status: Secondary | ICD-10-CM | POA: Diagnosis not present

## 2014-11-21 DIAGNOSIS — I10 Essential (primary) hypertension: Secondary | ICD-10-CM | POA: Diagnosis not present

## 2014-11-21 DIAGNOSIS — Z7951 Long term (current) use of inhaled steroids: Secondary | ICD-10-CM | POA: Diagnosis not present

## 2014-11-21 DIAGNOSIS — I252 Old myocardial infarction: Secondary | ICD-10-CM | POA: Diagnosis not present

## 2014-11-21 DIAGNOSIS — E780 Pure hypercholesterolemia: Secondary | ICD-10-CM | POA: Diagnosis not present

## 2014-11-21 DIAGNOSIS — G894 Chronic pain syndrome: Secondary | ICD-10-CM | POA: Diagnosis not present

## 2014-11-21 DIAGNOSIS — Z79899 Other long term (current) drug therapy: Secondary | ICD-10-CM | POA: Diagnosis not present

## 2014-11-25 DIAGNOSIS — M5136 Other intervertebral disc degeneration, lumbar region: Secondary | ICD-10-CM | POA: Diagnosis not present

## 2014-11-25 DIAGNOSIS — G894 Chronic pain syndrome: Secondary | ICD-10-CM | POA: Diagnosis not present

## 2014-11-25 DIAGNOSIS — M5416 Radiculopathy, lumbar region: Secondary | ICD-10-CM | POA: Diagnosis not present

## 2014-11-26 ENCOUNTER — Encounter: Payer: Self-pay | Admitting: Nurse Practitioner

## 2014-11-26 ENCOUNTER — Ambulatory Visit (INDEPENDENT_AMBULATORY_CARE_PROVIDER_SITE_OTHER): Payer: Medicare Other | Admitting: Nurse Practitioner

## 2014-11-26 VITALS — BP 140/80 | HR 72 | Ht 62.0 in | Wt 168.4 lb

## 2014-11-26 DIAGNOSIS — R1314 Dysphagia, pharyngoesophageal phase: Secondary | ICD-10-CM | POA: Diagnosis not present

## 2014-11-26 DIAGNOSIS — K219 Gastro-esophageal reflux disease without esophagitis: Secondary | ICD-10-CM | POA: Diagnosis not present

## 2014-11-26 DIAGNOSIS — R1319 Other dysphagia: Secondary | ICD-10-CM

## 2014-11-26 DIAGNOSIS — R1310 Dysphagia, unspecified: Secondary | ICD-10-CM

## 2014-11-26 MED ORDER — SUCRALFATE 1 G PO TABS: 1.0000 g | ORAL_TABLET | Freq: Three times a day (TID) | ORAL | Status: DC

## 2014-11-26 NOTE — Progress Notes (Addendum)
     History of Present Illness:   Patient is a 64 year old female, previously followed by Dr. Sharlett Iles. She has multiple medical problems, is on multiple medications and has multiple drug allergies. 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 We have also evaluated her for bloating / epigastric pain / and diverticulitis.  Her most recent EGD was Sept 2013, done for evaluation of epigastric pain and dysphagia. Exam was normal. She was empirically dilated with a Maloney dilator. He dysphagia significantly improved post dilation. Her last colonoscopy was August 2013 with findings of left sided diverticulosis.   Patient comes in today for uncontrolled GERD symptoms. She complains of pyrosis and regurgitation, also recently started having recurrent problems with solid food dysphagia. Problems started after a recent acute illness with nausea, vomiting and diarrhea.  No odynophagia. No recent antibiotics. She hasn't used steroid based inhalers in a longtime.   Current Medications, Allergies, Past Medical History, Past Surgical History, Family History and Social History were reviewed in [employer] record.   Physical Exam: General: Pleasant, well developed , white female in no acute distress Head: Normocephalic and atraumatic Eyes:  sclerae anicteric, conjunctiva pink  Ears: Normal auditory acuity Lungs: Clear throughout to auscultation Heart: Regular rate and rhythm Abdomen: Soft, non distended, non-tender. No masses, no hepatomegaly. Normal bowel sounds Musculoskeletal: Symmetrical with no gross deformities  Extremities: No edema  Neurological: Alert oriented x 4, grossly nonfocal Psychological:  Alert and cooperative. Normal mood and affect  Assessment and Recommendations:  40. 64 year old female with exacerbation of GERD after recent gastroenteritis type illness. The multiple episodes of vomiting probably led to esophageal irritation. I have asked  her to double PPI for next couple of weeks. Will try carafate (crush and dissolve in water given dysphagia). See #2.   2. Recurrent solid food dysphagia. Dysphagia resolved after empirical dilation in 2013. She is interested in repeat EGD with dilation, will schedule this out for 2-3 weeks. In the interim  we discussed the importance of eating slowly, taking small bites, chewing well and consuming adequate amounts of fluid in between bites to avoid food impaction.    Patient requests to see Dr. Hilarie Fredrickson since Dr. Sharlett Iles has retired   Addendum: Reviewed and agree with initial management. Jerene Bears, MD

## 2014-11-26 NOTE — Patient Instructions (Signed)
We have sent the following medications to your pharmacy for you to pick up at your convenience: Carafate. Please crush tablets and dissolve in a tiny amount of water and swallow.  You have been scheduled for an endoscopy. Please follow written instructions given to you at your visit today. If you use inhalers (even only as needed), please bring them with you on the day of your procedure.

## 2014-11-27 DIAGNOSIS — R1310 Dysphagia, unspecified: Secondary | ICD-10-CM | POA: Insufficient documentation

## 2014-11-27 DIAGNOSIS — R1319 Other dysphagia: Secondary | ICD-10-CM | POA: Insufficient documentation

## 2014-12-02 ENCOUNTER — Telehealth: Payer: Self-pay | Admitting: Nurse Practitioner

## 2014-12-02 NOTE — Telephone Encounter (Signed)
Patient reports that this morning she had some dizziness and felt sick to her stomach after taking her medication.  Her neurologist adjusted her "nerve pain" meds yesterday and doubled her dosage. She does not know the name of her nerve  pain medication is and I do not see it on her list.  She has lost her glasses and can't read the bottle.  She is advised that new side effects are most likely the result of medication adjustments and not from the carafate.  She is asking if there could be an interaction with these two medications.  I explained that I could not tell her this if she does not have the other medication name.  I have asked her to contact her pharmacist and they can tell her better if there is a risk of an interaction.  She is advised that unlikely that carafate is causing dizziness and most likely coming from her "nerve pain" dosage increase.  She reports that yesterday after increasing her her "nerve pain" meds she had tingling in her hands that lasted for several hours also.  She will check with her pharmacist and if he feels the new side effects are from the carafate to call us back.

## 2014-12-03 NOTE — Telephone Encounter (Signed)
Agree. Thanks

## 2014-12-04 DIAGNOSIS — H43811 Vitreous degeneration, right eye: Secondary | ICD-10-CM | POA: Diagnosis not present

## 2014-12-08 ENCOUNTER — Encounter: Payer: Self-pay | Admitting: Internal Medicine

## 2014-12-08 ENCOUNTER — Ambulatory Visit: Payer: Medicare Other | Attending: Internal Medicine | Admitting: Internal Medicine

## 2014-12-08 VITALS — BP 132/78 | HR 83 | Temp 98.5°F | Ht 62.0 in | Wt 167.2 lb

## 2014-12-08 DIAGNOSIS — F411 Generalized anxiety disorder: Secondary | ICD-10-CM

## 2014-12-08 DIAGNOSIS — I10 Essential (primary) hypertension: Secondary | ICD-10-CM | POA: Diagnosis not present

## 2014-12-08 DIAGNOSIS — M5416 Radiculopathy, lumbar region: Secondary | ICD-10-CM | POA: Diagnosis not present

## 2014-12-08 DIAGNOSIS — I2510 Atherosclerotic heart disease of native coronary artery without angina pectoris: Secondary | ICD-10-CM

## 2014-12-08 DIAGNOSIS — G894 Chronic pain syndrome: Secondary | ICD-10-CM | POA: Diagnosis not present

## 2014-12-08 DIAGNOSIS — E876 Hypokalemia: Secondary | ICD-10-CM

## 2014-12-08 DIAGNOSIS — M5136 Other intervertebral disc degeneration, lumbar region: Secondary | ICD-10-CM | POA: Diagnosis not present

## 2014-12-08 DIAGNOSIS — M5440 Lumbago with sciatica, unspecified side: Secondary | ICD-10-CM | POA: Diagnosis not present

## 2014-12-08 LAB — BASIC METABOLIC PANEL
BUN: 21 mg/dL (ref 6–23)
CHLORIDE: 104 meq/L (ref 96–112)
CO2: 31 mEq/L (ref 19–32)
Calcium: 10.6 mg/dL — ABNORMAL HIGH (ref 8.4–10.5)
Creat: 1.03 mg/dL (ref 0.50–1.10)
GLUCOSE: 110 mg/dL — AB (ref 70–99)
POTASSIUM: 4.8 meq/L (ref 3.5–5.3)
Sodium: 142 mEq/L (ref 135–145)

## 2014-12-08 MED ORDER — ALPRAZOLAM 1 MG PO TABS: 0.5000 mg | ORAL_TABLET | Freq: Two times a day (BID) | ORAL | Status: DC | PRN

## 2014-12-08 MED ORDER — VALSARTAN 320 MG PO TABS: 320.0000 mg | ORAL_TABLET | Freq: Every day | ORAL | Status: DC

## 2014-12-08 MED ORDER — HYDROCHLOROTHIAZIDE 12.5 MG PO TABS: 12.5000 mg | ORAL_TABLET | Freq: Every day | ORAL | Status: DC

## 2014-12-08 NOTE — Progress Notes (Signed)
Patient ID: ALBIRTHA GRINAGE, female   DOB: 1951-06-28, 64 y.o.   MRN: [id number]   [name], is a 64 y.o. female  FUX:[id number]  [id number]  DOB - 12-28-1950  Chief Complaint  Patient presents with  . Sinusitis  . Hypertension        Subjective:   [name] is a 64 y.o. female here today for a follow up visit. Patient has history of hypertension, coronary artery disease, major depression and anxiety disorder, GERD, spinal stenosis and IBS. Patient presents today for HTN follow up. Patient has had a sinus infection x 1 week with facial soreness and a pain scale of 3/10. Patient was wanting to know if we have received a copy of her records from [place] [facility] where she was referred to for pain management. Patient is also requesting refill of Xanax prescription for her anxiety. Patient has been referred to psychiatrist in the past for management of anxiety and depression but she did not like the fact that the psychiatrist was not willing to prescribe Xanax. Patient has No headache, No chest pain, No abdominal pain - No Nausea, No new weakness tingling or numbness, No Cough - SOB.  Problem  Hypokalemia    ALLERGIES: Allergies  Allergen Reactions  . Amoxicillin Swelling    Throat Swells  . Azithromycin Swelling    Throat Swelling  . Bromfed Swelling    Throat Swelling  . Cephalexin Swelling    Throat Swelling  . Chlordiazepoxide-Clidinium Swelling    Throat Swelling  . Clotrimazole Other (See Comments) and Hypertension    Patient told me that she couldn't swallow due to the medication  . Dicyclomine Hcl Hives  . Gatifloxacin     Other reaction(s): Other (See Comments)  . Hydralazine Hcl     Rash and itching  . Ibuprofen     N/V  . Iohexol      Code: HIVES, Desc: throat swelling no hives 20 yrs ago;needs pre-medication  09/19/07 sg, Onset Date: [id number]   . Latex Swelling    Blisters on Skin  . Lidocaine Hives  . Lisinopril     Other reaction(s):  Angioedema (ALLERGY/intolerance)  . Librax  [Chlordiazepoxide-Clidinium]     Other reaction(s): Other (See Comments)  . Paroxetine Swelling    Throat Swelling  . Penicillins Hives  . Prednisone Swelling    Throat swelling  . Pregabalin     Other reaction(s): Other (See Comments) nervousness  . Propoxyphene N-Acetaminophen Swelling    REACTION: swelling in the throat  . Sertraline     Other reaction(s): Other (See Comments)  . Sertraline Hcl Swelling    Throat Swelling  . Sulfa Antibiotics     Other reaction(s): Unknown  . Sulfadiazine Swelling    Throat Swelling  . Tussionex Pennkinetic Er [Hydrocod Polst-Cpm Polst Er] Itching    "Sunburn"  . Verapamil Swelling    Throat Swelling    PAST MEDICAL HISTORY: Past Medical History  Diagnosis Date  . Myocardial infarct 2005  . CAD (coronary artery disease)   . Hypertension   . Long term (current) use of anticoagulants   . Edema   . Hematoma   . Sinusitis acute   . Adenocarcinoma of breast     right  . Acute upper respiratory infections of unspecified site   . Depression   . Anal fissure   . GERD (gastroesophageal reflux disease)   . Dog bite(E906.0)   . Diverticulosis of colon (without mention of hemorrhage)   .  Spinal stenosis, lumbar region, without neurogenic claudication   . Anxiety   . Panic attacks   . Asthma   . Irritable bowel syndrome   . Hiatal hernia   . Jaundice     Hx of Jaundice at age 25 from "dirty restuarant". Unsure of Hepatitis type  . Neuropathy     MEDICATIONS AT HOME: Prior to Admission medications   Medication Sig Start Date End Date Taking? Authorizing Provider  albuterol (PROVENTIL HFA;VENTOLIN HFA) 108 (90 BASE) MCG/ACT inhaler Inhale 2 puffs into the lungs every 4 (four) hours as needed for wheezing or shortness of breath. 07/27/14  Yes Noemi Chapel, MD  ALPRAZolam Duanne Moron) 1 MG tablet Take 0.5-1 tablets (0.5-1 mg total) by mouth 2 (two) times daily as needed for anxiety or sleep. 12/08/14   Yes Tresa Garter, MD  ascorbic acid (VITAMIN C) 1000 MG tablet Take 1,000 mg by mouth daily.   Yes Historical Provider, MD  aspirin EC 81 MG tablet Take 81 mg by mouth daily.   Yes Historical Provider, MD  Calcium Carbonate-Vitamin D (CALCIUM-CARB 600 + D) 600-125 MG-UNIT TABS Take 1 capsule by mouth daily.    Yes Historical Provider, MD  cetirizine (ZYRTEC) 10 MG tablet Take 1 tablet (10 mg total) by mouth daily. 09/18/14  Yes Tresa Garter, MD  cycloSPORINE (RESTASIS) 0.05 % ophthalmic emulsion Place 1 drop into both eyes 2 (two) times daily.     Yes Historical Provider, MD  Fluticasone-Salmeterol (ADVAIR) 250-50 MCG/DOSE AEPB Inhale 1 puff into the lungs 2 (two) times daily. Patient taking differently: Inhale 1 puff into the lungs 2 (two) times daily as needed.  03/24/14  Yes Tresa Garter, MD  hydrochlorothiazide (HYDRODIURIL) 12.5 MG tablet Take 1 tablet (12.5 mg total) by mouth daily. 12/08/14  Yes Tresa Garter, MD  metoprolol (LOPRESSOR) 50 MG tablet Take 1 tablet (50 mg total) by mouth daily. 11/03/14  Yes Tresa Garter, MD  pantoprazole (PROTONIX) 40 MG tablet Take 1 tablet (40 mg total) by mouth daily. 11/03/14  Yes Tresa Garter, MD  pravastatin (PRAVACHOL) 40 MG tablet Take 1 tablet (40 mg total) by mouth every evening. 07/14/14  Yes Keyleen Cerrato Essie Christine, MD  sucralfate (CARAFATE) 1 G tablet Take 1 tablet (1 g total) by mouth 4 (four) times daily -  with meals and at bedtime. 11/26/14  Yes Willia Craze, NP  topiramate (TOPAMAX) 25 MG capsule Take 25 mg by mouth daily.   Yes Historical Provider, MD  valsartan (DIOVAN) 320 MG tablet Take 1 tablet (320 mg total) by mouth daily. 12/08/14  Yes Tresa Garter, MD  vitamin E (VITAMIN E) 400 UNIT capsule Take 400 Units by mouth daily.   Yes Historical Provider, MD  VOLTAREN 1 % GEL Apply 2 g topically 2 (two) times daily as needed (pain).  06/30/13  Yes Historical Provider, MD  promethazine (PHENERGAN) 12.5  MG tablet Take 1 tablet (12.5 mg total) by mouth every 6 (six) hours as needed for nausea or vomiting. Patient not taking: Reported on 12/08/2014 09/21/14   Junius Creamer, NP     Objective:   Filed Vitals:   12/08/14 1542  BP: 132/78  Pulse: 83  Temp: 98.5 F (36.9 C)  TempSrc: Oral  Height: 5\' 2"  (1.575 m)  Weight: 167 lb 3.2 oz (75.841 kg)  SpO2: 99%    Exam General appearance : Awake, alert, not in any distress. Speech Clear. Not toxic looking HEENT: Atraumatic and Normocephalic, pupils  equally reactive to light and accomodation Neck: supple, no JVD. No cervical lymphadenopathy.  Chest:Good air entry bilaterally, no added sounds  CVS: S1 S2 regular, no murmurs.  Abdomen: Bowel sounds present, Non tender and not distended with no gaurding, rigidity or rebound. Extremities: B/L Lower Ext shows no edema, both legs are warm to touch Neurology: Awake alert, and oriented X 3, CN II-XII intact, Non focal Skin:No Rash  Data Review Lab Results  Component Value Date   HGBA1C 5.8* 11/26/2013   HGBA1C 6.2 06/10/2011   HGBA1C * 07/16/2010    5.9 (NOTE)                                                                       According to the ADA Clinical Practice Recommendations for 2011, when HbA1c is used as a screening test:   >=6.5%   Diagnostic of Diabetes Mellitus           (if abnormal result  is confirmed)  5.7-6.4%   Increased risk of developing Diabetes Mellitus  References:Diagnosis and Classification of Diabetes Mellitus,Diabetes XLKG,4010,27(OZDGU 1):S62-S69 and Standards of Medical Care in         Diabetes - 2011,Diabetes Care,2011,34  (Suppl 1):S11-S61.     Assessment & Plan   1. Anxiety state  - ALPRAZolam (XANAX) 1 MG tablet; Take 0.5-1 tablets (0.5-1 mg total) by mouth 2 (two) times daily as needed for anxiety or sleep.  Dispense: 60 tablet; Refill: 3  - Patient was counseled extensively, she was educated about the prescription of Xanax, patient has been referred to  psychiatrist multiple times in the past for ongoing Xanax prescription, she has been told of our policy in this clinic concerning benzodiazepine prescription, if patient requires chronic anxiety management with anxiolytics/benzo, she will have to get it from the psychiatrist but for now we will continue to prescribe Xanax while patient awaits psychiatry appointment. When patient establishes care with the psychiatrist, we will no longer prescribe Xanax. Patient verbalized understanding and promised to comply.  2. Essential hypertension  - hydrochlorothiazide (HYDRODIURIL) 12.5 MG tablet; Take 1 tablet (12.5 mg total) by mouth daily.  Dispense: 90 tablet; Refill: 3 - valsartan (DIOVAN) 320 MG tablet; Take 1 tablet (320 mg total) by mouth daily.  Dispense: 90 tablet; Refill: 3  We have discussed target BP range and blood pressure goal. I have advised patient to check BP regularly and to call us back or report to clinic if the numbers are consistently higher than 140/90. We discussed the importance of compliance with medical therapy and DASH diet recommended, consequences of uncontrolled hypertension discussed.  - continue current BP medications  3. Hypokalemia  - Basic Metabolic Panel - Calcium, ionized  Patient have been counseled extensively about nutrition and exercise Return for Follow up HTN, Follow up Pain and comorbidities, Generalized Anxiety Disorder.  The patient was given clear instructions to go to ER or return to medical center if symptoms don't improve, worsen or new problems develop. The patient verbalized understanding. The patient was told to call to get lab results if they haven't heard anything in the next week.   This note has been created with Surveyor, quantity. Any transcriptional errors are unintentional.  Angelica Chessman, MD, [place], [place], [place], [place] and [place],  [place]   12/08/2014, 4:29 PM

## 2014-12-08 NOTE — Progress Notes (Signed)
Patient presents for HTN follow up. Patient has had a sinus infection x 1 week facial soreness with a pain scale of 3/10. Patient was wanting to know if we have received a copy of her records from [place] [facility].

## 2014-12-08 NOTE — Patient Instructions (Signed)
Generalized Anxiety Disorder Generalized anxiety disorder (GAD) is a mental disorder. It interferes with life functions, including relationships, work, and school. GAD is different from normal anxiety, which everyone experiences at some point in their lives in response to specific life events and activities. Normal anxiety actually helps us prepare for and get through these life events and activities. Normal anxiety goes away after the event or activity is over.  GAD causes anxiety that is not necessarily related to specific events or activities. It also causes excess anxiety in proportion to specific events or activities. The anxiety associated with GAD is also difficult to control. GAD can vary from mild to severe. People with severe GAD can have intense waves of anxiety with physical symptoms (panic attacks).  SYMPTOMS The anxiety and worry associated with GAD are difficult to control. This anxiety and worry are related to many life events and activities and also occur more days than not for 6 months or longer. People with GAD also have three or more of the following symptoms (one or more in children):  Restlessness.   Fatigue.  Difficulty concentrating.   Irritability.  Muscle tension.  Difficulty sleeping or unsatisfying sleep. DIAGNOSIS GAD is diagnosed through an assessment by your health care provider. Your health care provider will ask you questions aboutyour mood,physical symptoms, and events in your life. Your health care provider may ask you about your medical history and use of alcohol or drugs, including prescription medicines. Your health care provider may also do a physical exam and blood tests. Certain medical conditions and the use of certain substances can cause symptoms similar to those associated with GAD. Your health care provider may refer you to a mental health specialist for further evaluation. TREATMENT The following therapies are usually used to treat GAD:    Medication. Antidepressant medication usually is prescribed for long-term daily control. Antianxiety medicines may be added in severe cases, especially when panic attacks occur.   Talk therapy (psychotherapy). Certain types of talk therapy can be helpful in treating GAD by providing support, education, and guidance. A form of talk therapy called cognitive behavioral therapy can teach you healthy ways to think about and react to daily life events and activities.  Stress managementtechniques. These include yoga, meditation, and exercise and can be very helpful when they are practiced regularly. A mental health specialist can help determine which treatment is best for you. Some people see improvement with one therapy. However, other people require a combination of therapies. Document Released: 12/24/2012 Document Revised: 01/13/2014 Document Reviewed: 12/24/2012 ExitCare Patient Information 2015 [employer]. This information is not intended to replace advice given to you by your health care provider. Make sure you discuss any questions you have with your health care provider.  

## 2014-12-09 ENCOUNTER — Telehealth: Payer: Self-pay | Admitting: Internal Medicine

## 2014-12-09 LAB — CALCIUM, IONIZED: Calcium, Ion: 1.45 mmol/L — ABNORMAL HIGH (ref 1.12–1.32)

## 2014-12-09 NOTE — Telephone Encounter (Signed)
Patient called to speak to her PCP, she would like her PCP to call Dr. Lucia Gaskins @ [facility]: [phone].

## 2014-12-11 NOTE — Telephone Encounter (Signed)
Expand All Collapse All   Patient called to speak to her PCP, she would like her PCP to call Dr. Lucia Gaskins @ [facility]: [phone].

## 2014-12-15 ENCOUNTER — Telehealth: Payer: Self-pay

## 2014-12-15 NOTE — Telephone Encounter (Signed)
-----   Message from Tresa Garter, MD sent at 12/12/2014  5:50 PM EDT ----- Physical inform patient that her calcium level is high, will need to repeat the test in 4 weeks, if level remains high we'll need to further investigate.

## 2014-12-15 NOTE — Telephone Encounter (Signed)
Patient is aware of her lab results 

## 2014-12-16 ENCOUNTER — Ambulatory Visit: Payer: Medicare Other | Attending: Family Medicine | Admitting: Family Medicine

## 2014-12-16 ENCOUNTER — Telehealth: Payer: Self-pay | Admitting: Internal Medicine

## 2014-12-16 VITALS — BP 169/97 | HR 78 | Temp 98.2°F

## 2014-12-16 DIAGNOSIS — J019 Acute sinusitis, unspecified: Secondary | ICD-10-CM

## 2014-12-16 DIAGNOSIS — I10 Essential (primary) hypertension: Secondary | ICD-10-CM

## 2014-12-16 DIAGNOSIS — H43811 Vitreous degeneration, right eye: Secondary | ICD-10-CM | POA: Diagnosis not present

## 2014-12-16 MED ORDER — DOXYCYCLINE HYCLATE 100 MG PO TABS: 100.0000 mg | ORAL_TABLET | Freq: Two times a day (BID) | ORAL | Status: DC

## 2014-12-16 NOTE — Patient Instructions (Addendum)
1. Start antibiotic twice a day for 10 days. 2.  Continue home treatment as now. 3.  Warm steam mist my help open up and drain better. 4.  You may be getting a viral Upper Respiratory Infection or your ST and cough may be from sinus drainage. 5. Come back in one week to see nurse for BP check.

## 2014-12-16 NOTE — Telephone Encounter (Signed)
Discussed with pt that as long as she was not running a fever she should be fine for her procedure, she verbalized understanding.

## 2014-12-16 NOTE — Progress Notes (Signed)
Patient walked in complaining of sinus/facial pain and ear pain for over a week.  She also states she woke up with a cough and sore throat this morning.  She had to use her albuterol inhaler this morning and does have a history of asthma.  She has also been taking otc allergy medication and took a Zyrtec this am.  Patient denies a lot of nasal drainage but says that she has been coughing a lot of yellow mucous up.

## 2014-12-16 NOTE — Progress Notes (Signed)
Patient ID: [name], female   DOB: 1951/04/19, 64 y.o.   MRN: [id number]   CC:  "Sinus Infection"  Subjective:  Patient presents with a 8 + day history of sinus congestion with worsening pressure and pain on the left. She has a history of allergies and is using her allergy medications as prescribed. She has also developed, over the last 2 days, a ST with cough. She denies fever, chills, body aches. She does report being in pain today from chronic painful conditions.  Objective:  Patient is alert, oriented, and appropriate, in no acute distress.  TMs are clear. Nasal passages minorly inflammed with small amount of mucus present. There is redness on the posterior pharynx wall. There is tenderness over the left maxillary and frontal sinuses. Her neck is supple FROM w/o adenopathy or tenderness. Lungs are clear to auscultation. HS are regular w/o m,g,r.   Repeat BP 157/93  Assessment:  Acute Sinusitis Hypertension  Plan:  Doxycycline 100 mg, # 20; one po bid for 10 days. Continue symptomatic measures at home  Return for BP check with nurse in one week.   Micheline Chapman, FNP-BC

## 2014-12-17 ENCOUNTER — Ambulatory Visit (AMBULATORY_SURGERY_CENTER): Payer: Medicare Other | Admitting: Internal Medicine

## 2014-12-17 ENCOUNTER — Encounter: Payer: Self-pay | Admitting: Internal Medicine

## 2014-12-17 VITALS — BP 182/103 | HR 70 | Temp 97.3°F | Resp 15 | Ht 62.0 in | Wt 168.0 lb

## 2014-12-17 DIAGNOSIS — R1314 Dysphagia, pharyngoesophageal phase: Secondary | ICD-10-CM | POA: Diagnosis not present

## 2014-12-17 DIAGNOSIS — I2510 Atherosclerotic heart disease of native coronary artery without angina pectoris: Secondary | ICD-10-CM | POA: Diagnosis not present

## 2014-12-17 DIAGNOSIS — R1319 Other dysphagia: Secondary | ICD-10-CM

## 2014-12-17 DIAGNOSIS — K2990 Gastroduodenitis, unspecified, without bleeding: Secondary | ICD-10-CM | POA: Diagnosis not present

## 2014-12-17 DIAGNOSIS — K219 Gastro-esophageal reflux disease without esophagitis: Secondary | ICD-10-CM | POA: Diagnosis not present

## 2014-12-17 DIAGNOSIS — I10 Essential (primary) hypertension: Secondary | ICD-10-CM | POA: Diagnosis not present

## 2014-12-17 DIAGNOSIS — R1310 Dysphagia, unspecified: Secondary | ICD-10-CM

## 2014-12-17 DIAGNOSIS — K259 Gastric ulcer, unspecified as acute or chronic, without hemorrhage or perforation: Secondary | ICD-10-CM | POA: Diagnosis not present

## 2014-12-17 DIAGNOSIS — K2970 Gastritis, unspecified, without bleeding: Secondary | ICD-10-CM | POA: Diagnosis not present

## 2014-12-17 DIAGNOSIS — B3781 Candidal esophagitis: Secondary | ICD-10-CM | POA: Diagnosis not present

## 2014-12-17 MED ORDER — SODIUM CHLORIDE 0.9 % IV SOLN: 500.0000 mL | INTRAVENOUS | Status: DC

## 2014-12-17 MED ORDER — FLUCONAZOLE 100 MG PO TABS: ORAL_TABLET | ORAL | Status: DC

## 2014-12-17 NOTE — Progress Notes (Signed)
Patient awakening vss, report to rn 

## 2014-12-17 NOTE — Progress Notes (Signed)
Called to room to assist during endoscopic procedure.  Patient ID and intended procedure confirmed with present staff. Received instructions for my participation in the procedure from the performing physician.  

## 2014-12-17 NOTE — Patient Instructions (Signed)
YOU HAD AN ENDOSCOPIC PROCEDURE TODAY AT [facility]:   Refer to the procedure report that was given to you for any specific questions about what was found during the examination.  If the procedure report does not answer your questions, please call your gastroenterologist to clarify.  If you requested that your care partner not be given the details of your procedure findings, then the procedure report has been included in a sealed envelope for you to review at your convenience later.  YOU SHOULD EXPECT: Some feelings of bloating in the abdomen. Passage of more gas than usual.  Walking can help get rid of the air that was put into your GI tract during the procedure and reduce the bloating. If you had a lower endoscopy (such as a colonoscopy or flexible sigmoidoscopy) you may notice spotting of blood in your stool or on the toilet paper. If you underwent a bowel prep for your procedure, you may not have a normal bowel movement for a few days.  Please Note:  You might notice some irritation and congestion in your nose or some drainage.  This is from the oxygen used during your procedure.  There is no need for concern and it should clear up in a day or so.  SYMPTOMS TO REPORT IMMEDIATELY:   Following upper endoscopy (EGD)  Vomiting of blood or coffee ground material  New chest pain or pain under the shoulder blades  Painful or persistently difficult swallowing  New shortness of breath  Fever of 100F or higher  Black, tarry-looking stools  For urgent or emergent issues, a gastroenterologist can be reached at any hour by calling [phone].   DIET: see dilation diet-  Drink plenty of fluids but you should avoid alcoholic beverages for 24 hours.  ACTIVITY:  You should plan to take it easy for the rest of today and you should NOT DRIVE or use heavy machinery until tomorrow (because of the sedation medicines used during the test).    FOLLOW UP: Our staff will call the number  listed on your records the next business day following your procedure to check on you and address any questions or concerns that you may have regarding the information given to you following your procedure. If we do not reach you, we will leave a message.  However, if you are feeling well and you are not experiencing any problems, there is no need to return our call.  We will assume that you have returned to your regular daily activities without incident.  If any biopsies were taken you will be contacted by phone or by letter within the next 1-3 weeks.  Please call us at [phone] if you have not heard about the biopsies in 3 weeks.    SIGNATURES/CONFIDENTIALITY: You and/or your care partner have signed paperwork which will be entered into your electronic medical record.  These signatures attest to the fact that that the information above on your After Visit Summary has been reviewed and is understood.  Full responsibility of the confidentiality of this discharge information lies with you and/or your care-partner.  Wait biopsy results.  Repeat dilation as needed.  Office follow-up next available, office will call you with appointment date and time.  Start flucconazole 200mg  x 1 day, then 100 mg x 13 days.

## 2014-12-17 NOTE — Op Note (Signed)
[place]  [address] [place], [id number]   ENDOSCOPY PROCEDURE REPORT  PATIENT: Linda, [name]  MR#: [id number] BIRTHDATE: 1951/09/04 , 63  yrs. old GENDER: female ENDOSCOPIST: Jerene Bears, MD PROCEDURE DATE:  12/17/2014 PROCEDURE:  EGD w/ biopsy and EGD w/ balloon dilation ASA CLASS:     Class III INDICATIONS:  dysphagia, history of GERD, and upper abdominal bloating. MEDICATIONS: Monitored anesthesia care and Propofol 200 mg IV TOPICAL ANESTHETIC: none  DESCRIPTION OF PROCEDURE: After the risks benefits and alternatives of the procedure were thoroughly explained, informed consent was obtained.  The LB [id number] endoscope was introduced through the mouth and advanced to the second portion of the duodenum , Without limitations.  The instrument was slowly withdrawn as the mucosa was fully examined.    ESOPHAGUS: White exudates consistent with candidiasis were found in the upper third of the esophagus and middle third esophagus.   The z-line was located 38cm from the incisors.  The z-line appeared normal.   Given dysphagia and response to prior empiric dilation (Dec 2013 54Fr [facility]) a TTS-balloon was used to dilation across the GEJ up to 9mm.  The balloon was held inflated for 60 seconds. No complications seen.  STOMACH: Patchy of flat inflammation versus polyp was found on the greater curvature of the gastric body.  Multiple biopsies were performed using cold forceps.   Otherwise normal stomach.  DUODENUM: The duodenal mucosa showed no abnormalities in the bulb and 2nd part of the duodenum.  Retroflexed views revealed no abnormalities.     The scope was then withdrawn from the patient and the procedure completed.  COMPLICATIONS: There were no immediate complications.   ENDOSCOPIC IMPRESSION: 1.   White exudates consistent with candidiasis in the upper third of the esophagus and middle third esophagus 2.   The z-line was located 38cm  from the incisors 3.   Using a TTS-balloon the distal esophagus across the GEJ was dilated up to 46mm 4.   Gastritis versus polyp was found on the greater curvature of the gastric body; multiple biopsies were performed 5.   The duodenal mucosa showed no abnormalities in the bulb and 2nd part of the duodenum  RECOMMENDATIONS: 1.  Await biopsy results 2.  Fluconazole 200 mg x 1 day, 100 mg x 13 days for candida esophagitis 3.  Repeat dilation as needed 4.  Office follow-up next available  eSigned:  Jerene Bears, MD 12/17/2014 5:17 PM    CC: the patient, PCP, Tye Savoy, NP  PATIENT NAME:  Linda, [name] MR#: [id number]

## 2014-12-18 ENCOUNTER — Telehealth: Payer: Self-pay | Admitting: *Deleted

## 2014-12-18 NOTE — Telephone Encounter (Signed)
  Follow up Call-  Call back number 12/17/2014 08/29/2012 05/11/2012  Post procedure Call Back phone  # [phone]  Permission to leave phone message Yes Yes Yes     Patient questions:  Do you have a fever, pain , or abdominal swelling? No. Pain Score  0 *  Have you tolerated food without any problems? Yes.    Have you been able to return to your normal activities? Yes.    Do you have any questions about your discharge instructions: Diet   No. Medications  No. Follow up visit  No.  Do you have questions or concerns about your Care? No.  Actions: * If pain score is 4 or above: No action needed, pain <4.  States throat feels a little sore but no pain.  Will call us back if it worsens,.

## 2014-12-19 ENCOUNTER — Telehealth: Payer: Self-pay | Admitting: Internal Medicine

## 2014-12-19 NOTE — Telephone Encounter (Signed)
Spoke with pt and let her know results are not back yet.

## 2014-12-21 ENCOUNTER — Encounter (HOSPITAL_COMMUNITY): Payer: Self-pay

## 2014-12-21 ENCOUNTER — Emergency Department (HOSPITAL_COMMUNITY)
Admission: EM | Admit: 2014-12-21 | Discharge: 2014-12-21 | Disposition: A | Discharge status: Home / Self Care | Payer: Medicare Other | Attending: Emergency Medicine | Admitting: Emergency Medicine

## 2014-12-21 DIAGNOSIS — X58XXXA Exposure to other specified factors, initial encounter: Secondary | ICD-10-CM | POA: Insufficient documentation

## 2014-12-21 DIAGNOSIS — Z7982 Long term (current) use of aspirin: Secondary | ICD-10-CM | POA: Diagnosis not present

## 2014-12-21 DIAGNOSIS — K219 Gastro-esophageal reflux disease without esophagitis: Secondary | ICD-10-CM | POA: Insufficient documentation

## 2014-12-21 DIAGNOSIS — H5713 Ocular pain, bilateral: Secondary | ICD-10-CM | POA: Diagnosis not present

## 2014-12-21 DIAGNOSIS — I2510 Atherosclerotic heart disease of native coronary artery without angina pectoris: Secondary | ICD-10-CM | POA: Insufficient documentation

## 2014-12-21 DIAGNOSIS — J45909 Unspecified asthma, uncomplicated: Secondary | ICD-10-CM | POA: Diagnosis not present

## 2014-12-21 DIAGNOSIS — Z880 Allergy status to penicillin: Secondary | ICD-10-CM | POA: Insufficient documentation

## 2014-12-21 DIAGNOSIS — Y998 Other external cause status: Secondary | ICD-10-CM | POA: Diagnosis not present

## 2014-12-21 DIAGNOSIS — Z87891 Personal history of nicotine dependence: Secondary | ICD-10-CM | POA: Diagnosis not present

## 2014-12-21 DIAGNOSIS — I252 Old myocardial infarction: Secondary | ICD-10-CM | POA: Diagnosis not present

## 2014-12-21 DIAGNOSIS — S0501XA Injury of conjunctiva and corneal abrasion without foreign body, right eye, initial encounter: Secondary | ICD-10-CM | POA: Diagnosis not present

## 2014-12-21 DIAGNOSIS — Z853 Personal history of malignant neoplasm of breast: Secondary | ICD-10-CM | POA: Diagnosis not present

## 2014-12-21 DIAGNOSIS — Y9289 Other specified places as the place of occurrence of the external cause: Secondary | ICD-10-CM | POA: Insufficient documentation

## 2014-12-21 DIAGNOSIS — Z79899 Other long term (current) drug therapy: Secondary | ICD-10-CM | POA: Diagnosis not present

## 2014-12-21 DIAGNOSIS — Z7951 Long term (current) use of inhaled steroids: Secondary | ICD-10-CM | POA: Insufficient documentation

## 2014-12-21 DIAGNOSIS — Z9889 Other specified postprocedural states: Secondary | ICD-10-CM | POA: Diagnosis not present

## 2014-12-21 DIAGNOSIS — I10 Essential (primary) hypertension: Secondary | ICD-10-CM | POA: Diagnosis not present

## 2014-12-21 DIAGNOSIS — Z8669 Personal history of other diseases of the nervous system and sense organs: Secondary | ICD-10-CM | POA: Insufficient documentation

## 2014-12-21 DIAGNOSIS — Z7901 Long term (current) use of anticoagulants: Secondary | ICD-10-CM | POA: Insufficient documentation

## 2014-12-21 DIAGNOSIS — Y9389 Activity, other specified: Secondary | ICD-10-CM | POA: Diagnosis not present

## 2014-12-21 DIAGNOSIS — Z91040 Latex allergy status: Secondary | ICD-10-CM | POA: Insufficient documentation

## 2014-12-21 DIAGNOSIS — F329 Major depressive disorder, single episode, unspecified: Secondary | ICD-10-CM | POA: Insufficient documentation

## 2014-12-21 DIAGNOSIS — F410 Panic disorder [episodic paroxysmal anxiety] without agoraphobia: Secondary | ICD-10-CM | POA: Diagnosis not present

## 2014-12-21 DIAGNOSIS — R030 Elevated blood-pressure reading, without diagnosis of hypertension: Secondary | ICD-10-CM | POA: Diagnosis not present

## 2014-12-21 DIAGNOSIS — H5711 Ocular pain, right eye: Secondary | ICD-10-CM

## 2014-12-21 DIAGNOSIS — S0591XA Unspecified injury of right eye and orbit, initial encounter: Secondary | ICD-10-CM | POA: Diagnosis present

## 2014-12-21 MED ORDER — ONDANSETRON 8 MG PO TBDP
8.0000 mg | ORAL_TABLET | Freq: Once | ORAL | Status: AC
  Administered 2014-12-21: 8 mg via ORAL
  Filled 2014-12-21: qty 1

## 2014-12-21 MED ORDER — OXYCODONE-ACETAMINOPHEN 5-325 MG PO TABS
1.0000 | ORAL_TABLET | Freq: Once | ORAL | Status: AC
  Administered 2014-12-21: 1 via ORAL
  Filled 2014-12-21: qty 1

## 2014-12-21 MED ORDER — TOBRAMYCIN 0.3 % OP OINT
TOPICAL_OINTMENT | Freq: Three times a day (TID) | OPHTHALMIC | Status: DC
  Administered 2014-12-21: 10:00:00 via OPHTHALMIC
  Filled 2014-12-21: qty 3.5

## 2014-12-21 MED ORDER — FLUORESCEIN SODIUM 1 MG OP STRP
1.0000 | ORAL_STRIP | Freq: Once | OPHTHALMIC | Status: AC
  Administered 2014-12-21: 1 via OPHTHALMIC
  Filled 2014-12-21: qty 1

## 2014-12-21 MED ORDER — PROPARACAINE HCL 0.5 % OP SOLN
1.0000 [drp] | Freq: Once | OPHTHALMIC | Status: AC
  Administered 2014-12-21: 1 [drp] via OPHTHALMIC
  Filled 2014-12-21: qty 15

## 2014-12-21 MED ORDER — HYDROCODONE-ACETAMINOPHEN 5-325 MG PO TABS: 1.0000 | ORAL_TABLET | Freq: Four times a day (QID) | ORAL | Status: DC | PRN

## 2014-12-21 NOTE — ED Notes (Signed)
Awake. Verbally responsive. A/O x4. Resp even and unlabored. No audible adventitious breath sounds noted. ABC's intact. Pt reported having some pain to rt eye with visual disturbances (blurred), sensitive to light, and clear drainage. Family at bedside.

## 2014-12-21 NOTE — ED Notes (Signed)
Bed: [facility]PT3 Expected date:  Expected time:  Means of arrival:  Comments: EMS 64 yo eye pain, seen once aleady this week - triage

## 2014-12-21 NOTE — ED Provider Notes (Signed)
CSN: [id number]     Arrival date & time 12/21/14  0414 History   First MD Initiated Contact with Patient 12/21/14 0600     Chief Complaint  Patient presents with  . Eye Pain     (Consider location/radiation/quality/duration/timing/severity/associated sxs/prior Treatment) HPI Patient presents to the emergency department with right eye discomfort.  The patient, states she has been having this for the last 4 days, but got worse last night.  The patient states she was seen by her eye doctor, Dr. Zadie Rhine, who was going to recheck her in several weeks for the floater that she noticed in her eye.  The patient states that she feels like she has a gritty feeling in her eyes well.  The patient denies headache, blurred vision, weakness, dizziness, headache, back pain, neck pain, fever, chest pain, shortness of breath, nausea, vomiting, or syncope.  The patient states that she did not take any medications prior to arrival.  Nothing seems make her condition better, but light does make her more sensitive Past Medical History  Diagnosis Date  . Myocardial infarct 2005  . CAD (coronary artery disease)   . Hypertension   . Long term (current) use of anticoagulants   . Edema   . Hematoma   . Sinusitis acute   . Adenocarcinoma of breast     right  . Acute upper respiratory infections of unspecified site   . Depression   . Anal fissure   . GERD (gastroesophageal reflux disease)   . Dog bite(E906.0)   . Diverticulosis of colon (without mention of hemorrhage)   . Spinal stenosis, lumbar region, without neurogenic claudication   . Anxiety   . Panic attacks   . Asthma   . Irritable bowel syndrome   . Hiatal hernia   . Jaundice     Hx of Jaundice at age 43 from "dirty restuarant". Unsure of Hepatitis type  . Neuropathy    Past Surgical History  Procedure Laterality Date  . Partial hysterectomy  1987  . Cardiac catheterization  2001  . Coronary angioplasty with stent placement  2001  .  Cholecystectomy    . Bladder repair      tact  . Breast lumpectomy Right   . Breast reconstruction Right   . Breast reduction surgery Left   . Colonoscopy  2013    Diverticulosis  . Cataract extraction    . Esophageal manometry  10/08/2012    Procedure: ESOPHAGEAL MANOMETRY (EM);  Surgeon: Sable Feil, MD;  Location: [facility];  Service: Endoscopy;  Laterality: N/A;  . Esophagogastroduodenoscopy  2014    Normal    Family History  Problem Relation Age of Onset  . Hypertension Mother   . Heart disease Mother   . Dementia Mother   . Arthritis Mother   . Diabetes Mother   . Colon polyps Mother   . Prostate cancer Father   . Hypertension Father   . Colon polyps Father   . Lung cancer Maternal Uncle   . Hypertension Sister   . Hypertension Brother   . Heart disease Sister   . Rectal cancer Neg Hx   . Stomach cancer Neg Hx   . Colon cancer Cousin   . Inflammatory bowel disease Sister    History  Substance Use Topics  . Smoking status: Former Smoker -- 0.30 packs/day for 8 years    Types: Cigarettes    Quit date: 09/13/1983  . Smokeless tobacco: Never Used  . Alcohol Use: No  OB History    No data available     Review of Systems  All other systems negative except as documented in the HPI. All pertinent positives and negatives as reviewed in the HPI.   Allergies  Adhesive; Amoxicillin; Azithromycin; Bromfed; Cephalexin; Chlordiazepoxide-clidinium; Clotrimazole; Dicyclomine hcl; Gatifloxacin; Hydralazine hcl; Ibuprofen; Iohexol; Latex; Lidocaine; Lisinopril; Librax ; Paroxetine; Penicillins; Prednisone; Pregabalin; Propoxyphene n-acetaminophen; Sertraline; Sertraline hcl; Sulfa antibiotics; Sulfadiazine; Tussionex pennkinetic er; and Verapamil  Home Medications   Prior to Admission medications   Medication Sig Start Date End Date Taking? Authorizing Provider  acetaminophen (TYLENOL) 500 MG tablet Take 500 mg by mouth every 6 (six) hours as needed for moderate  pain.   Yes Historical Provider, MD  albuterol (PROVENTIL HFA;VENTOLIN HFA) 108 (90 BASE) MCG/ACT inhaler Inhale 2 puffs into the lungs every 4 (four) hours as needed for wheezing or shortness of breath. 07/27/14  Yes Noemi Chapel, MD  ALPRAZolam Duanne Moron) 1 MG tablet Take 0.5-1 tablets (0.5-1 mg total) by mouth 2 (two) times daily as needed for anxiety or sleep. 12/08/14  Yes Tresa Garter, MD  ascorbic acid (VITAMIN C) 1000 MG tablet Take 1,000 mg by mouth daily.   Yes Historical Provider, MD  aspirin EC 81 MG tablet Take 81 mg by mouth daily.   Yes Historical Provider, MD  Calcium Carbonate-Vitamin D (CALCIUM-CARB 600 + D) 600-125 MG-UNIT TABS Take 1 capsule by mouth daily.    Yes Historical Provider, MD  cetirizine (ZYRTEC) 10 MG tablet Take 1 tablet (10 mg total) by mouth daily. 09/18/14  Yes Tresa Garter, MD  cycloSPORINE (RESTASIS) 0.05 % ophthalmic emulsion Place 1 drop into both eyes 2 (two) times daily.     Yes Historical Provider, MD  doxycycline (VIBRA-TABS) 100 MG tablet Take 1 tablet (100 mg total) by mouth 2 (two) times daily. 12/16/14  Yes Micheline Chapman, NP  fluconazole (DIFLUCAN) 100 MG tablet 200mg  x 1 day, then 100mg  x 13 days for candida esophagitis. 12/17/14  Yes Jerene Bears, MD  Fluticasone-Salmeterol (ADVAIR) 250-50 MCG/DOSE AEPB Inhale 1 puff into the lungs 2 (two) times daily. Patient taking differently: Inhale 1 puff into the lungs 2 (two) times daily as needed.  03/24/14  Yes Tresa Garter, MD  hydrochlorothiazide (HYDRODIURIL) 12.5 MG tablet Take 1 tablet (12.5 mg total) by mouth daily. 12/08/14  Yes Tresa Garter, MD  metoprolol (LOPRESSOR) 50 MG tablet Take 1 tablet (50 mg total) by mouth daily. 11/03/14  Yes Tresa Garter, MD  oxymetazoline (AFRIN) 0.05 % nasal spray Place 1 spray into both nostrils 2 (two) times daily as needed for congestion.   Yes Historical Provider, MD  pantoprazole (PROTONIX) 40 MG tablet Take 1 tablet (40 mg total) by mouth  daily. 11/03/14  Yes Tresa Garter, MD  pravastatin (PRAVACHOL) 40 MG tablet Take 1 tablet (40 mg total) by mouth every evening. 07/14/14  Yes Tresa Garter, MD  topiramate (TOPAMAX) 25 MG capsule Take 25 mg by mouth daily.   Yes Historical Provider, MD  valsartan (DIOVAN) 320 MG tablet Take 1 tablet (320 mg total) by mouth daily. 12/08/14  Yes Tresa Garter, MD  vitamin E (VITAMIN E) 400 UNIT capsule Take 400 Units by mouth daily.   Yes Historical Provider, MD  VOLTAREN 1 % GEL Apply 2 g topically 2 (two) times daily as needed (pain).  06/30/13  Yes Historical Provider, MD  promethazine (PHENERGAN) 12.5 MG tablet Take 1 tablet (12.5 mg total) by mouth  every 6 (six) hours as needed for nausea or vomiting. Patient not taking: Reported on 12/08/2014 09/21/14   Junius Creamer, NP  sucralfate (CARAFATE) 1 G tablet Take 1 tablet (1 g total) by mouth 4 (four) times daily -  with meals and at bedtime. Patient not taking: Reported on 12/21/2014 11/26/14   Willia Craze, NP   BP 170/90 mmHg  Pulse 92  Temp(Src) 98.3 F (36.8 C) (Oral)  Resp 18  Wt 164 lb (74.39 kg)  SpO2 98% Physical Exam  Constitutional: She is oriented to person, place, and time. She appears well-developed and well-nourished. No distress.  HENT:  Head: Normocephalic and atraumatic.  Mouth/Throat: Oropharynx is clear and moist.  Eyes: Conjunctivae, EOM and lids are normal. Pupils are equal, round, and reactive to light. Lids are everted and swept, no foreign bodies found. Right eye exhibits no discharge and no exudate. No foreign body present in the right eye. Left eye exhibits no discharge and no exudate. No foreign body present in the left eye.  Fundoscopic exam:      The right eye shows red reflex.  Slit lamp exam:      The right eye shows corneal abrasion and fluorescein uptake. The right eye shows no corneal flare, no corneal ulcer and no anterior chamber bulge.       The left eye shows no anterior chamber bulge.   Neck: Normal range of motion. Neck supple.  Cardiovascular: Normal rate, regular rhythm and normal heart sounds.  Exam reveals no gallop and no friction rub.   No murmur heard. Pulmonary/Chest: Effort normal and breath sounds normal. No respiratory distress.  Musculoskeletal: Normal range of motion.  Neurological: She is alert and oriented to person, place, and time. She exhibits normal muscle tone. Coordination normal.  Skin: Skin is warm and dry. No rash noted. No erythema.  Nursing note and vitals reviewed.   ED Course  Procedures (including critical care time)   MDM   Final diagnoses:  None     I spoke with Dr. Zadie Rhine who will see the patient in reevaluation tomorrow.  The patient has some uptake of the floor seen in the central portion of the corneal treat for this.  Patient is advised to return here as needed.  She is feeling better following her initial presentation after medications.  Dalia Heading, PA-C 12/24/14 1518  Malvin Johns, MD 12/26/14 [phone]

## 2014-12-21 NOTE — Discharge Instructions (Signed)
Call Dr. Zadie Rhine tomorrow morning for an appointment for tomorrow afternoon.  Return here as needed

## 2014-12-21 NOTE — ED Notes (Signed)
Patient arrives to facility via EMS with complaints of right eye pain.  She reported to them that it feels like sand in her eye.  She has been seen by her opthamalogist several times this past week.

## 2014-12-22 DIAGNOSIS — S0501XA Injury of conjunctiva and corneal abrasion without foreign body, right eye, initial encounter: Secondary | ICD-10-CM | POA: Diagnosis not present

## 2014-12-23 ENCOUNTER — Ambulatory Visit: Payer: Medicare Other | Attending: Internal Medicine | Admitting: Family Medicine

## 2014-12-23 VITALS — BP 147/85 | HR 76 | Temp 98.3°F | Resp 16 | Ht 62.0 in | Wt 166.0 lb

## 2014-12-23 DIAGNOSIS — R42 Dizziness and giddiness: Secondary | ICD-10-CM

## 2014-12-23 LAB — COMPLETE METABOLIC PANEL WITH GFR
ALT: 18 U/L (ref 0–35)
AST: 18 U/L (ref 0–37)
Albumin: 4.2 g/dL (ref 3.5–5.2)
Alkaline Phosphatase: 46 U/L (ref 39–117)
BILIRUBIN TOTAL: 0.6 mg/dL (ref 0.2–1.2)
BUN: 18 mg/dL (ref 6–23)
CO2: 31 mEq/L (ref 19–32)
CREATININE: 0.97 mg/dL (ref 0.50–1.10)
Calcium: 10.2 mg/dL (ref 8.4–10.5)
Chloride: 102 mEq/L (ref 96–112)
GFR, EST AFRICAN AMERICAN: 73 mL/min
GFR, Est Non African American: 63 mL/min
Glucose, Bld: 114 mg/dL — ABNORMAL HIGH (ref 70–99)
Potassium: 4.6 mEq/L (ref 3.5–5.3)
Sodium: 141 mEq/L (ref 135–145)
Total Protein: 6.6 g/dL (ref 6.0–8.3)

## 2014-12-23 LAB — CBC WITH DIFFERENTIAL/PLATELET
BASOS ABS: 0 10*3/uL (ref 0.0–0.1)
Basophils Relative: 0 % (ref 0–1)
EOS PCT: 1 % (ref 0–5)
Eosinophils Absolute: 0.1 10*3/uL (ref 0.0–0.7)
HEMATOCRIT: 41.9 % (ref 36.0–46.0)
Hemoglobin: 13.8 g/dL (ref 12.0–15.0)
Lymphocytes Relative: 38 % (ref 12–46)
Lymphs Abs: 1.9 10*3/uL (ref 0.7–4.0)
MCH: 30.1 pg (ref 26.0–34.0)
MCHC: 32.9 g/dL (ref 30.0–36.0)
MCV: 91.3 fL (ref 78.0–100.0)
MONO ABS: 0.4 10*3/uL (ref 0.1–1.0)
MONOS PCT: 8 % (ref 3–12)
MPV: 10.8 fL (ref 8.6–12.4)
NEUTROS ABS: 2.7 10*3/uL (ref 1.7–7.7)
Neutrophils Relative %: 53 % (ref 43–77)
PLATELETS: 209 10*3/uL (ref 150–400)
RBC: 4.59 MIL/uL (ref 3.87–5.11)
RDW: 13.9 % (ref 11.5–15.5)
WBC: 5 10*3/uL (ref 4.0–10.5)

## 2014-12-23 NOTE — Patient Instructions (Addendum)
No climbing stairs without holding on the bannister. Drink plenty of liquids ED if symptoms worsen or if developes additional symptoms of concern We will call you with lab results.

## 2014-12-23 NOTE — Progress Notes (Signed)
Patient ID: [name], female   DOB: [birth year], 64 y.o.   MRN: [id number] CC:  "Dizziness"  HPI:  Patient with multiple health problems and anxiety, presents for "dizziness" which started on Saturday. When questioned, she describes the "dizziness" as felling off balance and like she is going to fall to the right. She denies any presyncopal symptoms and denies felling like the room is spinning or that she is spinning in the room. She does say it only happens when standing, no symptoms when sitting or lying. Seems worse when she closes her eyes. However when asked if it seems to be her head or her legs that is presenting the problem, she states legs.  She has spinal issues and has an appointment on May 5th to have a device implanted to help with pain.She also has problems with allergies and Chronic sinusitis. She is on multiple medications.Her problem list includes CAD with previous MI. HTN, Anxiety, depression, GERD.  She denies any chest pain or SOB or any other symptoms.  ROS:    See HPI  Exam:  Patient is alert, oriented, appropriate, skin is warm and dry.  TMS are dull. Neck is supple FROM w/o adenopathy or tenderness. Lungs are clear to auscultation. HS are regular. Cranial Nerves II-XII intact. EOMS intact, PERL,no arm drift, grips strong and equal. She is unable to stand on one foot or perform tandem gain but she is not dizzy when she attemps this.

## 2014-12-23 NOTE — Progress Notes (Signed)
Patient complains of dizziness times 3 days.  She says that when she closes her eyes she feels like she will fall.  She is also asking to have her blood sugar checked.  Patient reports she has been losing 2 lbs per week.

## 2014-12-26 ENCOUNTER — Telehealth: Payer: Self-pay | Admitting: *Deleted

## 2014-12-26 NOTE — Telephone Encounter (Signed)
Attempted to call patient to give her negative lab results but phone rang and rang with no answer.  Called on patient's cell and gave her the results and advised her to keep her appointment with Dr, Doreene Burke for 4/25.  Patient appreciative for information

## 2014-12-29 ENCOUNTER — Encounter: Payer: Self-pay | Admitting: Internal Medicine

## 2014-12-31 ENCOUNTER — Telehealth: Payer: Self-pay | Admitting: Internal Medicine

## 2014-12-31 NOTE — Telephone Encounter (Signed)
Spoke with pt and discussed results letter with pt.

## 2015-01-05 ENCOUNTER — Ambulatory Visit: Payer: Medicare Other | Attending: Internal Medicine | Admitting: Internal Medicine

## 2015-01-05 ENCOUNTER — Encounter: Payer: Self-pay | Admitting: Internal Medicine

## 2015-01-05 ENCOUNTER — Ambulatory Visit: Payer: Self-pay | Admitting: Clinical

## 2015-01-05 VITALS — BP 136/84 | HR 87 | Temp 98.9°F | Resp 16 | Ht 63.0 in | Wt 164.0 lb

## 2015-01-05 DIAGNOSIS — I2510 Atherosclerotic heart disease of native coronary artery without angina pectoris: Secondary | ICD-10-CM

## 2015-01-05 DIAGNOSIS — K219 Gastro-esophageal reflux disease without esophagitis: Secondary | ICD-10-CM | POA: Insufficient documentation

## 2015-01-05 DIAGNOSIS — F411 Generalized anxiety disorder: Secondary | ICD-10-CM | POA: Insufficient documentation

## 2015-01-05 DIAGNOSIS — F322 Major depressive disorder, single episode, severe without psychotic features: Secondary | ICD-10-CM | POA: Insufficient documentation

## 2015-01-05 DIAGNOSIS — F329 Major depressive disorder, single episode, unspecified: Secondary | ICD-10-CM

## 2015-01-05 MED ORDER — PANTOPRAZOLE SODIUM 40 MG PO TBEC: 40.0000 mg | DELAYED_RELEASE_TABLET | Freq: Every day | ORAL | Status: DC

## 2015-01-05 NOTE — Progress Notes (Signed)
Pt is here following up on her HTN and asthma. Pt reports having chronic pain in her back and legs.
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
## 2015-01-12 ENCOUNTER — Ambulatory Visit: Payer: Self-pay | Admitting: Internal Medicine

## 2015-01-15 ENCOUNTER — Other Ambulatory Visit: Payer: Self-pay | Admitting: Internal Medicine

## 2015-01-15 DIAGNOSIS — C50911 Malignant neoplasm of unspecified site of right female breast: Secondary | ICD-10-CM

## 2015-01-19 ENCOUNTER — Ambulatory Visit: Payer: Self-pay | Admitting: Clinical
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
## 2015-01-21 DIAGNOSIS — H43811 Vitreous degeneration, right eye: Secondary | ICD-10-CM | POA: Diagnosis not present

## 2015-01-22 ENCOUNTER — Ambulatory Visit: Payer: Medicare Other | Attending: Internal Medicine | Admitting: Physician Assistant

## 2015-01-22 VITALS — BP 154/95 | HR 70 | Wt 164.8 lb

## 2015-01-22 DIAGNOSIS — M62830 Muscle spasm of back: Secondary | ICD-10-CM | POA: Diagnosis not present

## 2015-01-22 DIAGNOSIS — J069 Acute upper respiratory infection, unspecified: Secondary | ICD-10-CM

## 2015-01-22 MED ORDER — CYCLOBENZAPRINE HCL 10 MG PO TABS: 10.0000 mg | ORAL_TABLET | Freq: Two times a day (BID) | ORAL | Status: DC

## 2015-01-22 MED ORDER — DOXYCYCLINE HYCLATE 100 MG PO TABS: 100.0000 mg | ORAL_TABLET | Freq: Two times a day (BID) | ORAL | Status: DC

## 2015-01-22 NOTE — Progress Notes (Signed)
Chief Complaint: Back pain and congestion  Subjective: 64 year old female presenting with 2 complaints:  1. 5 days of increasing cough with yellow phlegm. Also has had congestion in the head face and chest. Her throat is little sore. Her right ear is bothering her as well. No fever. No CP. No SHOB.  2. 4 days of soreness in her mid back. Location is no bilateral flank area. Does not work. Has not done any heavy lifting. He describes it as an achiness. No urinary frequency urgency or hesitancy. No nocturia or blood in her urine.   ROS:  GEN: denies fever or chills, denies change in weight HEENT: denies headache, +earache, epistaxis, +sore throat, or neck pain LUNGS: denies SHOB, dyspnea, PND, orthopnea CV: denies CP or palpitations EXT: denies muscle spasms or swelling; no pain in lower ext, no weakness NEURO: denies numbness or tingling, denies sz, stroke or TIA   Objective:  Filed Vitals:   01/22/15 1150  BP: 154/95  Pulse: 70  Weight: 164 lb 12.8 oz (74.753 kg)  SpO2: 97%    Physical Exam:  General: in no acute distress. HEENT: no pallor, no icterus, moist oral mucosa, no JVD, no lymphadenopathy; red left ear>right Heart: Normal  s1 &s2  Regular rate and rhythm, without murmurs, rubs, gallops. Lungs: Clear to auscultation bilaterally. Extremities: No clubbing cyanosis or edema with positive pedal pulses. Neuro: Alert, awake, oriented x3, nonfocal.   Medications: Prior to Admission medications   Medication Sig Start Date End Date Taking? Authorizing Provider  acetaminophen (TYLENOL) 500 MG tablet Take 500 mg by mouth every 6 (six) hours as needed for moderate pain.   Yes Historical Provider, MD  albuterol (PROVENTIL HFA;VENTOLIN HFA) 108 (90 BASE) MCG/ACT inhaler Inhale 2 puffs into the lungs every 4 (four) hours as needed for wheezing or shortness of breath. 07/27/14  Yes Noemi Chapel, MD  ALPRAZolam Duanne Moron) 1 MG tablet Take 0.5-1 tablets (0.5-1 mg total) by mouth 2  (two) times daily as needed for anxiety or sleep. 12/08/14  Yes Tresa Garter, MD  ascorbic acid (VITAMIN C) 1000 MG tablet Take 500 mg by mouth daily.    Yes Historical Provider, MD  aspirin EC 81 MG tablet Take 81 mg by mouth daily.   Yes Historical Provider, MD  Calcium Carbonate-Vitamin D (CALCIUM-CARB 600 + D) 600-125 MG-UNIT TABS Take 1 capsule by mouth daily.    Yes Historical Provider, MD  cetirizine (ZYRTEC) 10 MG tablet Take 1 tablet (10 mg total) by mouth daily. 09/18/14  Yes Tresa Garter, MD  cycloSPORINE (RESTASIS) 0.05 % ophthalmic emulsion Place 1 drop into both eyes 2 (two) times daily.     Yes Historical Provider, MD  Fluticasone-Salmeterol (ADVAIR) 250-50 MCG/DOSE AEPB Inhale 1 puff into the lungs 2 (two) times daily. Patient taking differently: Inhale 1 puff into the lungs 2 (two) times daily as needed.  03/24/14  Yes Tresa Garter, MD  hydrochlorothiazide (HYDRODIURIL) 12.5 MG tablet Take 1 tablet (12.5 mg total) by mouth daily. 12/08/14  Yes Tresa Garter, MD  HYDROcodone-acetaminophen (NORCO/VICODIN) 5-325 MG per tablet Take 1 tablet by mouth every 6 (six) hours as needed for moderate pain. 12/21/14  Yes Christopher Lawyer, PA-C  metoprolol (LOPRESSOR) 50 MG tablet Take 1 tablet (50 mg total) by mouth daily. 11/03/14  Yes Tresa Garter, MD  oxymetazoline (AFRIN) 0.05 % nasal spray Place 1 spray into both nostrils 2 (two) times daily as needed for congestion.   Yes Historical Provider, MD  pantoprazole (PROTONIX) 40 MG tablet Take 1 tablet (40 mg total) by mouth daily. 01/05/15  Yes Tresa Garter, MD  pravastatin (PRAVACHOL) 40 MG tablet Take 1 tablet (40 mg total) by mouth every evening. 07/14/14  Yes Tresa Garter, MD  valsartan (DIOVAN) 320 MG tablet Take 1 tablet (320 mg total) by mouth daily. 12/08/14  Yes Tresa Garter, MD  vitamin E (VITAMIN E) 400 UNIT capsule Take 400 Units by mouth daily.   Yes Historical Provider, MD  VOLTAREN 1 %  GEL Apply 2 g topically 2 (two) times daily as needed (pain).  06/30/13  Yes Historical Provider, MD  cyclobenzaprine (FLEXERIL) 10 MG tablet Take 1 tablet (10 mg total) by mouth 2 (two) times daily. 01/22/15   Shirlena Brinegar Daneil Dan, PA-C  doxycycline (VIBRA-TABS) 100 MG tablet Take 1 tablet (100 mg total) by mouth 2 (two) times daily. 01/22/15   Alastor Kneale Daneil Dan, PA-C  fluconazole (DIFLUCAN) 100 MG tablet 200mg  x 1 day, then 100mg  x 13 days for candida esophagitis. Patient not taking: Reported on 01/22/2015 12/17/14   Jerene Bears, MD  promethazine (PHENERGAN) 12.5 MG tablet Take 1 tablet (12.5 mg total) by mouth every 6 (six) hours as needed for nausea or vomiting. Patient not taking: Reported on 01/22/2015 09/21/14   Junius Creamer, NP  sucralfate (CARAFATE) 1 G tablet Take 1 tablet (1 g total) by mouth 4 (four) times daily -  with meals and at bedtime. Patient not taking: Reported on 01/22/2015 11/26/14   Willia Craze, NP  topiramate (TOPAMAX) 25 MG capsule Take 25 mg by mouth daily.    Historical Provider, MD    Assessment: 1. Acute Upper resp infection 2. Back pain-Musculoskeletal 3. Multiple Drug Allergies  Plan: Doxycycline (allergic to most other ABX) Flexeril Back stretching and strengthening exercises Most Heat  Follow up:as scheduled  The patient was given clear instructions to go to ER or return to medical center if symptoms don't improve, worsen or new problems develop. The patient verbalized understanding. The patient was told to call to get lab results if they haven't heard anything in the next week.   This note has been created with Surveyor, quantity. Any transcriptional errors are unintentional.   Zettie Pho, PA-C 01/22/2015, 12:07 PM

## 2015-01-23 ENCOUNTER — Encounter: Payer: Self-pay | Admitting: *Deleted

## 2015-01-30 DIAGNOSIS — F331 Major depressive disorder, recurrent, moderate: Secondary | ICD-10-CM | POA: Diagnosis not present

## 2015-01-30 DIAGNOSIS — F411 Generalized anxiety disorder: Secondary | ICD-10-CM | POA: Diagnosis not present

## 2015-02-03 DIAGNOSIS — F331 Major depressive disorder, recurrent, moderate: Secondary | ICD-10-CM | POA: Diagnosis not present

## 2015-02-03 DIAGNOSIS — F411 Generalized anxiety disorder: Secondary | ICD-10-CM | POA: Diagnosis not present

## 2015-02-05 ENCOUNTER — Ambulatory Visit: Payer: Medicare Other | Attending: Internal Medicine | Admitting: Internal Medicine

## 2015-02-05 ENCOUNTER — Encounter: Payer: Self-pay | Admitting: Internal Medicine

## 2015-02-05 ENCOUNTER — Telehealth: Payer: Self-pay | Admitting: Internal Medicine

## 2015-02-05 VITALS — BP 133/77 | HR 73 | Temp 99.1°F | Resp 16 | Ht 63.0 in | Wt 166.0 lb

## 2015-02-05 DIAGNOSIS — I2510 Atherosclerotic heart disease of native coronary artery without angina pectoris: Secondary | ICD-10-CM | POA: Diagnosis not present

## 2015-02-05 DIAGNOSIS — J322 Chronic ethmoidal sinusitis: Secondary | ICD-10-CM | POA: Insufficient documentation

## 2015-02-05 DIAGNOSIS — J069 Acute upper respiratory infection, unspecified: Secondary | ICD-10-CM | POA: Diagnosis not present

## 2015-02-05 MED ORDER — CETIRIZINE HCL 10 MG PO TABS: 10.0000 mg | ORAL_TABLET | Freq: Every day | ORAL | Status: DC

## 2015-02-05 NOTE — Progress Notes (Signed)
F/U HTN pain on arm Complaining of possible Sinus infection Congestion and productive cough and rt ear pain

## 2015-02-05 NOTE — Progress Notes (Signed)
Patient ID: [name], female   DOB: 11/22/1950, 64 y.o.   MRN: [id number]   Angelyna Henderson, is a 64 y.o. female  [id number]  [id number]  DOB - Nov 25, 1950  Chief Complaint  Patient presents with  . Follow-up  . Hypertension  . Sinusitis        Subjective:   [name] is a 64 y.o. female here today for a follow up visit.  Patient has multiple medical conditions as listed below which include chronic pain syndrome and generalized anxiety disorder. She walked into the clinic today complaining of symptoms of upper respiratory tract infection. She wants her allergy medicine refilled.  She was seen here about 2 weeks ago for the same complaint was given some medications including antibiotics. She also started symptomatic treatment of her respiratory infection and she is getting better. She has no new complaint other complaints. Patient has No headache, No chest pain, No abdominal pain - No Nausea, No new weakness tingling or numbness.  Problem  Acute Upper Respiratory Infection    ALLERGIES: Allergies  Allergen Reactions  . Adhesive [Tape] Other (See Comments)    blisters  . Amoxicillin Swelling    Throat Swells  . Azithromycin Swelling    Throat Swelling  . Bromfed Swelling    Throat Swelling  . Cephalexin Swelling    Throat Swelling  . Chlordiazepoxide-Clidinium Swelling    Throat Swelling  . Clotrimazole Other (See Comments) and Hypertension    Patient told me that she couldn't swallow due to the medication  . Dicyclomine Hcl Hives  . Gatifloxacin     Other reaction(s): Other (See Comments)  . Hydralazine Hcl     Rash and itching  . Ibuprofen     N/V  . Iohexol      Code: HIVES, Desc: throat swelling no hives 20 yrs ago;needs pre-medication  09/19/07 sg, Onset Date: [id number]   . Latex Swelling    Blisters on Skin  . Lidocaine Hives  . Lisinopril     Other reaction(s): Angioedema (ALLERGY/intolerance)  . Librax  [Chlordiazepoxide-Clidinium]     Other  reaction(s): Other (See Comments)  . Paroxetine Swelling    Throat Swelling  . Penicillins Hives  . Prednisone Swelling    Throat swelling  . Pregabalin     Other reaction(s): Other (See Comments) nervousness  . Propoxyphene N-Acetaminophen Swelling    REACTION: swelling in the throat  . Sertraline     Other reaction(s): Other (See Comments)  . Sertraline Hcl Swelling    Throat Swelling  . Sulfa Antibiotics     Other reaction(s): Unknown  . Sulfadiazine Swelling    Throat Swelling  . Tussionex Pennkinetic Er [Hydrocod Polst-Cpm Polst Er] Itching    "Sunburn"  . Verapamil Swelling    Throat Swelling    PAST MEDICAL HISTORY: Past Medical History  Diagnosis Date  . Myocardial infarct 2005  . CAD (coronary artery disease)   . Hypertension   . Long term (current) use of anticoagulants   . Edema   . Hematoma   . Sinusitis acute   . Adenocarcinoma of breast     right  . Acute upper respiratory infections of unspecified site   . Depression   . Anal fissure   . GERD (gastroesophageal reflux disease)   . Dog bite(E906.0)   . Diverticulosis of colon (without mention of hemorrhage)   . Spinal stenosis, lumbar region, without neurogenic claudication   . Anxiety   . Panic attacks   .  Asthma   . Irritable bowel syndrome   . Hiatal hernia   . Jaundice     Hx of Jaundice at age 29 from "dirty restuarant". Unsure of Hepatitis type  . Neuropathy     MEDICATIONS AT HOME: Prior to Admission medications   Medication Sig Start Date End Date Taking? Authorizing Provider  acetaminophen (TYLENOL) 500 MG tablet Take 500 mg by mouth every 6 (six) hours as needed for moderate pain.   Yes Historical Provider, MD  albuterol (PROVENTIL HFA;VENTOLIN HFA) 108 (90 BASE) MCG/ACT inhaler Inhale 2 puffs into the lungs every 4 (four) hours as needed for wheezing or shortness of breath. 07/27/14  Yes Noemi Chapel, MD  ALPRAZolam Duanne Moron) 1 MG tablet Take 0.5-1 tablets (0.5-1 mg total) by mouth 2  (two) times daily as needed for anxiety or sleep. 12/08/14  Yes Tresa Garter, MD  ascorbic acid (VITAMIN C) 1000 MG tablet Take 500 mg by mouth daily.    Yes Historical Provider, MD  aspirin EC 81 MG tablet Take 81 mg by mouth daily.   Yes Historical Provider, MD  Calcium Carbonate-Vitamin D (CALCIUM-CARB 600 + D) 600-125 MG-UNIT TABS Take 1 capsule by mouth daily.    Yes Historical Provider, MD  cetirizine (ZYRTEC) 10 MG tablet Take 1 tablet (10 mg total) by mouth daily. 02/05/15  Yes Tresa Garter, MD  cyclobenzaprine (FLEXERIL) 10 MG tablet Take 1 tablet (10 mg total) by mouth 2 (two) times daily. 01/22/15  Yes Tiffany Daneil Dan, PA-C  cycloSPORINE (RESTASIS) 0.05 % ophthalmic emulsion Place 1 drop into both eyes 2 (two) times daily.     Yes Historical Provider, MD  Fluticasone-Salmeterol (ADVAIR) 250-50 MCG/DOSE AEPB Inhale 1 puff into the lungs 2 (two) times daily. Patient taking differently: Inhale 1 puff into the lungs 2 (two) times daily as needed.  03/24/14  Yes Tresa Garter, MD  gabapentin (NEURONTIN) 100 MG capsule Take 100 mg by mouth 2 (two) times daily.   Yes Historical Provider, MD  hydrochlorothiazide (HYDRODIURIL) 12.5 MG tablet Take 1 tablet (12.5 mg total) by mouth daily. 12/08/14  Yes Tresa Garter, MD  metoprolol (LOPRESSOR) 50 MG tablet Take 1 tablet (50 mg total) by mouth daily. 11/03/14  Yes Tresa Garter, MD  oxymetazoline (AFRIN) 0.05 % nasal spray Place 1 spray into both nostrils 2 (two) times daily as needed for congestion.   Yes Historical Provider, MD  pantoprazole (PROTONIX) 40 MG tablet Take 1 tablet (40 mg total) by mouth daily. 01/05/15  Yes Tresa Garter, MD  pravastatin (PRAVACHOL) 40 MG tablet Take 1 tablet (40 mg total) by mouth every evening. 07/14/14  Yes Tresa Garter, MD  promethazine (PHENERGAN) 12.5 MG tablet Take 1 tablet (12.5 mg total) by mouth every 6 (six) hours as needed for nausea or vomiting. 09/21/14  Yes Junius Creamer,  NP  sucralfate (CARAFATE) 1 G tablet Take 1 tablet (1 g total) by mouth 4 (four) times daily -  with meals and at bedtime. 11/26/14  Yes Willia Craze, NP  topiramate (TOPAMAX) 25 MG capsule Take 25 mg by mouth daily.   Yes Historical Provider, MD  valsartan (DIOVAN) 320 MG tablet Take 1 tablet (320 mg total) by mouth daily. 12/08/14  Yes Tresa Garter, MD  vitamin E (VITAMIN E) 400 UNIT capsule Take 400 Units by mouth daily.   Yes Historical Provider, MD  VOLTAREN 1 % GEL Apply 2 g topically 2 (two) times daily as needed (pain).  06/30/13  Yes Historical Provider, MD  doxycycline (VIBRA-TABS) 100 MG tablet Take 1 tablet (100 mg total) by mouth 2 (two) times daily. Patient not taking: Reported on 02/05/2015 01/22/15   Brayton Caves, PA-C  fluconazole (DIFLUCAN) 100 MG tablet 200mg  x 1 day, then 100mg  x 13 days for candida esophagitis. Patient not taking: Reported on 01/22/2015 12/17/14   Jerene Bears, MD  HYDROcodone-acetaminophen (NORCO/VICODIN) 5-325 MG per tablet Take 1 tablet by mouth every 6 (six) hours as needed for moderate pain. Patient not taking: Reported on 02/05/2015 12/21/14   Dalia Heading, PA-C     Objective:   Filed Vitals:   02/05/15 1050  BP: 133/77  Pulse: 73  Temp: 99.1 F (37.3 C)  TempSrc: Oral  Resp: 16  Height: 5\' 3"  (1.6 m)  Weight: 166 lb (75.297 kg)  SpO2: 98%    Exam General appearance : Awake, alert, not in any distress. Speech Clear. Not toxic looking HEENT: Atraumatic and Normocephalic, pupils equally reactive to light and accomodation Neck: supple, no JVD. No cervical lymphadenopathy.  Chest:Good air entry bilaterally, no added sounds  CVS: S1 S2 regular, no murmurs.  Abdomen: Bowel sounds present, Non tender and not distended with no gaurding, rigidity or rebound. Extremities: B/L Lower Ext shows no edema, both legs are warm to touch Neurology: Awake alert, and oriented X 3, CN II-XII intact, Non focal Skin:No Rash  Data Review Lab  Results  Component Value Date   HGBA1C 5.8* 11/26/2013   HGBA1C 6.2 06/10/2011   HGBA1C * 07/16/2010    5.9 (NOTE)                                                                       According to the ADA Clinical Practice Recommendations for 2011, when HbA1c is used as a screening test:   >=6.5%   Diagnostic of Diabetes Mellitus           (if abnormal result  is confirmed)  5.7-6.4%   Increased risk of developing Diabetes Mellitus  References:Diagnosis and Classification of Diabetes Mellitus,Diabetes SWHQ,7591,63(WGYKZ 1):S62-S69 and Standards of Medical Care in         Diabetes - 2011,Diabetes Care,2011,34  (Suppl 1):S11-S61.     Assessment & Plan   1. Chronic ethmoidal sinusitis  - cetirizine (ZYRTEC) 10 MG tablet; Take 1 tablet (10 mg total) by mouth daily.  Dispense: 30 tablet; Refill: 3  2. Acute upper respiratory infection  Symptomatic treatment  Patient have been counseled extensively about nutrition and exercise  Return in about 3 months (around 05/08/2015), or if symptoms worsen or fail to improve, for Follow up Pain and comorbidities, Generalized Anxiety Disorder.  The patient was given clear instructions to go to ER or return to medical center if symptoms don't improve, worsen or new problems develop. The patient verbalized understanding. The patient was told to call to get lab results if they haven't heard anything in the next week.   This note has been created with Surveyor, quantity. Any transcriptional errors are unintentional.    Angelica Chessman, MD, [place], [place], [place], [place] and [facility] [place], [place]   02/05/2015, 11:37 AM

## 2015-02-05 NOTE — Telephone Encounter (Signed)
Patient called to request for her potassium to be check, she would also like a prescription for VOLTAREN 1 % GEL. Please f/u with pt.

## 2015-02-05 NOTE — Patient Instructions (Signed)
Allergic Rhinitis Allergic rhinitis is when the mucous membranes in the nose respond to allergens. Allergens are particles in the air that cause your body to have an allergic reaction. This causes you to release allergic antibodies. Through a chain of events, these eventually cause you to release histamine into the blood stream. Although meant to protect the body, it is this release of histamine that causes your discomfort, such as frequent sneezing, congestion, and an itchy, runny nose.  CAUSES  Seasonal allergic rhinitis (hay fever) is caused by pollen allergens that may come from grasses, trees, and weeds. Year-round allergic rhinitis (perennial allergic rhinitis) is caused by allergens such as house dust mites, pet dander, and mold spores.  SYMPTOMS   Nasal stuffiness (congestion).  Itchy, runny nose with sneezing and tearing of the eyes. DIAGNOSIS  Your health care provider can help you determine the allergen or allergens that trigger your symptoms. If you and your health care provider are unable to determine the allergen, skin or blood testing may be used. TREATMENT  Allergic rhinitis does not have a cure, but it can be controlled by:  Medicines and allergy shots (immunotherapy).  Avoiding the allergen. Hay fever may often be treated with antihistamines in pill or nasal spray forms. Antihistamines block the effects of histamine. There are over-the-counter medicines that may help with nasal congestion and swelling around the eyes. Check with your health care provider before taking or giving this medicine.  If avoiding the allergen or the medicine prescribed do not work, there are many new medicines your health care provider can prescribe. Stronger medicine may be used if initial measures are ineffective. Desensitizing injections can be used if medicine and avoidance does not work. Desensitization is when a patient is given ongoing shots until the body becomes less sensitive to the allergen.  Make sure you follow up with your health care provider if problems continue. HOME CARE INSTRUCTIONS It is not possible to completely avoid allergens, but you can reduce your symptoms by taking steps to limit your exposure to them. It helps to know exactly what you are allergic to so that you can avoid your specific triggers. SEEK MEDICAL CARE IF:   You have a fever.  You develop a cough that does not stop easily (persistent).  You have shortness of breath.  You start wheezing.  Symptoms interfere with normal daily activities. Document Released: 05/24/2001 Document Revised: 09/03/2013 Document Reviewed: 05/06/2013 [facility] Patient Information 2015 [place], [place]. This information is not intended to replace advice given to you by your health care provider. Make sure you discuss any questions you have with your health care provider. Upper Respiratory Infection, Adult An upper respiratory infection (URI) is also sometimes known as the common cold. The upper respiratory tract includes the nose, sinuses, throat, trachea, and bronchi. Bronchi are the airways leading to the lungs. Most people improve within 1 week, but symptoms can last up to 2 weeks. A residual cough may last even longer.  CAUSES Many different viruses can infect the tissues lining the upper respiratory tract. The tissues become irritated and inflamed and often become very moist. Mucus production is also common. A cold is contagious. You can easily spread the virus to others by oral contact. This includes kissing, sharing a glass, coughing, or sneezing. Touching your mouth or nose and then touching a surface, which is then touched by another person, can also spread the virus. SYMPTOMS  Symptoms typically develop 1 to 3 days after you come in contact with  a cold virus. Symptoms vary from person to person. They may include:  Runny nose.  Sneezing.  Nasal congestion.  Sinus irritation.  Sore throat.  Loss of voice  (laryngitis).  Cough.  Fatigue.  Muscle aches.  Loss of appetite.  Headache.  Low-grade fever. DIAGNOSIS  You might diagnose your own cold based on familiar symptoms, since most people get a cold 2 to 3 times a year. Your caregiver can confirm this based on your exam. Most importantly, your caregiver can check that your symptoms are not due to another disease such as strep throat, sinusitis, pneumonia, asthma, or epiglottitis. Blood tests, throat tests, and X-rays are not necessary to diagnose a common cold, but they may sometimes be helpful in excluding other more serious diseases. Your caregiver will decide if any further tests are required. RISKS AND COMPLICATIONS  You may be at risk for a more severe case of the common cold if you smoke cigarettes, have chronic heart disease (such as heart failure) or lung disease (such as asthma), or if you have a weakened immune system. The very young and very old are also at risk for more serious infections. Bacterial sinusitis, middle ear infections, and bacterial pneumonia can complicate the common cold. The common cold can worsen asthma and chronic obstructive pulmonary disease (COPD). Sometimes, these complications can require emergency medical care and may be life-threatening. PREVENTION  The best way to protect against getting a cold is to practice good hygiene. Avoid oral or hand contact with people with cold symptoms. Wash your hands often if contact occurs. There is no clear evidence that vitamin C, vitamin E, echinacea, or exercise reduces the chance of developing a cold. However, it is always recommended to get plenty of rest and practice good nutrition. TREATMENT  Treatment is directed at relieving symptoms. There is no cure. Antibiotics are not effective, because the infection is caused by a virus, not by bacteria. Treatment may include:  Increased fluid intake. Sports drinks offer valuable electrolytes, sugars, and fluids.  Breathing  heated mist or steam (vaporizer or shower).  Eating chicken soup or other clear broths, and maintaining good nutrition.  Getting plenty of rest.  Using gargles or lozenges for comfort.  Controlling fevers with ibuprofen or acetaminophen as directed by your caregiver.  Increasing usage of your inhaler if you have asthma. Zinc gel and zinc lozenges, taken in the first 24 hours of the common cold, can shorten the duration and lessen the severity of symptoms. Pain medicines may help with fever, muscle aches, and throat pain. A variety of non-prescription medicines are available to treat congestion and runny nose. Your caregiver can make recommendations and may suggest nasal or lung inhalers for other symptoms.  HOME CARE INSTRUCTIONS   Only take over-the-counter or prescription medicines for pain, discomfort, or fever as directed by your caregiver.  Use a warm mist humidifier or inhale steam from a shower to increase air moisture. This may keep secretions moist and make it easier to breathe.  Drink enough water and fluids to keep your urine clear or pale yellow.  Rest as needed.  Return to work when your temperature has returned to normal or as your caregiver advises. You may need to stay home longer to avoid infecting others. You can also use a face mask and careful hand washing to prevent spread of the virus. SEEK MEDICAL CARE IF:   After the first few days, you feel you are getting worse rather than better.  You need your  caregiver's advice about medicines to control symptoms.  You develop chills, worsening shortness of breath, or brown or red sputum. These may be signs of pneumonia.  You develop yellow or brown nasal discharge or pain in the face, especially when you bend forward. These may be signs of sinusitis.  You develop a fever, swollen neck glands, pain with swallowing, or white areas in the back of your throat. These may be signs of strep throat. SEEK IMMEDIATE MEDICAL CARE  IF:   You have a fever.  You develop severe or persistent headache, ear pain, sinus pain, or chest pain.  You develop wheezing, a prolonged cough, cough up blood, or have a change in your usual mucus (if you have chronic lung disease).  You develop sore muscles or a stiff neck. Document Released: 02/22/2001 Document Revised: 11/21/2011 Document Reviewed: 12/04/2013 [facility] Patient Information 2015 [place], [place]. This information is not intended to replace advice given to you by your health care provider. Make sure you discuss any questions you have with your health care provider.

## 2015-02-06 ENCOUNTER — Emergency Department (HOSPITAL_COMMUNITY)
Admission: EM | Admit: 2015-02-06 | Discharge: 2015-02-06 | Disposition: A | Discharge status: Home / Self Care | Payer: Medicare Other | Attending: Emergency Medicine | Admitting: Emergency Medicine

## 2015-02-06 ENCOUNTER — Encounter (HOSPITAL_COMMUNITY): Payer: Self-pay | Admitting: *Deleted

## 2015-02-06 DIAGNOSIS — I252 Old myocardial infarction: Secondary | ICD-10-CM | POA: Insufficient documentation

## 2015-02-06 DIAGNOSIS — Z9889 Other specified postprocedural states: Secondary | ICD-10-CM | POA: Diagnosis not present

## 2015-02-06 DIAGNOSIS — J45909 Unspecified asthma, uncomplicated: Secondary | ICD-10-CM | POA: Diagnosis not present

## 2015-02-06 DIAGNOSIS — I2510 Atherosclerotic heart disease of native coronary artery without angina pectoris: Secondary | ICD-10-CM | POA: Diagnosis not present

## 2015-02-06 DIAGNOSIS — Z87891 Personal history of nicotine dependence: Secondary | ICD-10-CM | POA: Insufficient documentation

## 2015-02-06 DIAGNOSIS — R52 Pain, unspecified: Secondary | ICD-10-CM | POA: Diagnosis not present

## 2015-02-06 DIAGNOSIS — Z880 Allergy status to penicillin: Secondary | ICD-10-CM | POA: Insufficient documentation

## 2015-02-06 DIAGNOSIS — M545 Low back pain, unspecified: Secondary | ICD-10-CM

## 2015-02-06 DIAGNOSIS — G8929 Other chronic pain: Secondary | ICD-10-CM | POA: Diagnosis not present

## 2015-02-06 DIAGNOSIS — Z7982 Long term (current) use of aspirin: Secondary | ICD-10-CM | POA: Insufficient documentation

## 2015-02-06 DIAGNOSIS — Z79899 Other long term (current) drug therapy: Secondary | ICD-10-CM | POA: Diagnosis not present

## 2015-02-06 DIAGNOSIS — F410 Panic disorder [episodic paroxysmal anxiety] without agoraphobia: Secondary | ICD-10-CM | POA: Insufficient documentation

## 2015-02-06 DIAGNOSIS — K219 Gastro-esophageal reflux disease without esophagitis: Secondary | ICD-10-CM | POA: Insufficient documentation

## 2015-02-06 DIAGNOSIS — G629 Polyneuropathy, unspecified: Secondary | ICD-10-CM | POA: Diagnosis not present

## 2015-02-06 DIAGNOSIS — F329 Major depressive disorder, single episode, unspecified: Secondary | ICD-10-CM | POA: Insufficient documentation

## 2015-02-06 DIAGNOSIS — Z9861 Coronary angioplasty status: Secondary | ICD-10-CM | POA: Diagnosis not present

## 2015-02-06 DIAGNOSIS — Z91040 Latex allergy status: Secondary | ICD-10-CM | POA: Insufficient documentation

## 2015-02-06 DIAGNOSIS — Z7951 Long term (current) use of inhaled steroids: Secondary | ICD-10-CM | POA: Diagnosis not present

## 2015-02-06 DIAGNOSIS — M549 Dorsalgia, unspecified: Secondary | ICD-10-CM | POA: Diagnosis not present

## 2015-02-06 DIAGNOSIS — Z853 Personal history of malignant neoplasm of breast: Secondary | ICD-10-CM | POA: Insufficient documentation

## 2015-02-06 DIAGNOSIS — I10 Essential (primary) hypertension: Secondary | ICD-10-CM | POA: Diagnosis not present

## 2015-02-06 DIAGNOSIS — R110 Nausea: Secondary | ICD-10-CM | POA: Diagnosis not present

## 2015-02-06 MED ORDER — HYDROCODONE-ACETAMINOPHEN 5-325 MG PO TABS: 1.0000 | ORAL_TABLET | Freq: Four times a day (QID) | ORAL | Status: DC | PRN

## 2015-02-06 MED ORDER — METOCLOPRAMIDE HCL 5 MG/ML IJ SOLN
10.0000 mg | INTRAMUSCULAR | Status: AC
  Administered 2015-02-06: 10 mg via INTRAVENOUS
  Filled 2015-02-06: qty 2

## 2015-02-06 MED ORDER — FENTANYL CITRATE (PF) 100 MCG/2ML IJ SOLN
50.0000 ug | Freq: Once | INTRAMUSCULAR | Status: AC
  Administered 2015-02-06: 50 ug via INTRAVENOUS
  Filled 2015-02-06: qty 2

## 2015-02-06 NOTE — ED Notes (Signed)
Bed: HS92 Expected date:  Expected time:  Means of arrival:  Comments: EMS 63yo F sciatic / back pain

## 2015-02-06 NOTE — ED Notes (Signed)
Rush Landmark (boyfriend) [phone]

## 2015-02-06 NOTE — Discharge Instructions (Signed)
Back Pain, Adult Low back pain is very common. About 1 in 5 people have back pain.The cause of low back pain is rarely dangerous. The pain often gets better over time.About half of people with a sudden onset of back pain feel better in just 2 weeks. About 8 in 10 people feel better by 6 weeks.  CAUSES Some common causes of back pain include:  Strain of the muscles or ligaments supporting the spine.  Wear and tear (degeneration) of the spinal discs.  Arthritis.  Direct injury to the back. DIAGNOSIS Most of the time, the direct cause of low back pain is not known.However, back pain can be treated effectively even when the exact cause of the pain is unknown.Answering your caregiver's questions about your overall health and symptoms is one of the most accurate ways to make sure the cause of your pain is not dangerous. If your caregiver needs more information, he or she may order lab work or imaging tests (X-rays or MRIs).However, even if imaging tests show changes in your back, this usually does not require surgery. HOME CARE INSTRUCTIONS For many people, back pain returns.Since low back pain is rarely dangerous, it is often a condition that people can learn to manageon their own.   Remain active. It is stressful on the back to sit or stand in one place. Do not sit, drive, or stand in one place for more than 30 minutes at a time. Take short walks on level surfaces as soon as pain allows.Try to increase the length of time you walk each day.  Do not stay in bed.Resting more than 1 or 2 days can delay your recovery.  Do not avoid exercise or work.Your body is made to move.It is not dangerous to be active, even though your back may hurt.Your back will likely heal faster if you return to being active before your pain is gone.  Pay attention to your body when you bend and lift. Many people have less discomfortwhen lifting if they bend their knees, keep the load close to their bodies,and  avoid twisting. Often, the most comfortable positions are those that put less stress on your recovering back.  Find a comfortable position to sleep. Use a firm mattress and lie on your side with your knees slightly bent. If you lie on your back, put a pillow under your knees.  Only take over-the-counter or prescription medicines as directed by your caregiver. Over-the-counter medicines to reduce pain and inflammation are often the most helpful.Your caregiver may prescribe muscle relaxant drugs.These medicines help dull your pain so you can more quickly return to your normal activities and healthy exercise.  Put ice on the injured area.  Put ice in a plastic bag.  Place a towel between your skin and the bag.  Leave the ice on for 15-20 minutes, 03-04 times a day for the first 2 to 3 days. After that, ice and heat may be alternated to reduce pain and spasms.  Ask your caregiver about trying back exercises and gentle massage. This may be of some benefit.  Avoid feeling anxious or stressed.Stress increases muscle tension and can worsen back pain.It is important to recognize when you are anxious or stressed and learn ways to manage it.Exercise is a great option. SEEK MEDICAL CARE IF:  You have pain that is not relieved with rest or medicine.  You have pain that does not improve in 1 week.  You have new symptoms.  You are generally not feeling well. SEEK   IMMEDIATE MEDICAL CARE IF:   You have pain that radiates from your back into your legs.  You develop new bowel or bladder control problems.  You have unusual weakness or numbness in your arms or legs.  You develop nausea or vomiting.  You develop abdominal pain.  You feel faint. Document Released: 08/29/2005 Document Revised: 02/28/2012 Document Reviewed: 12/31/2013 [employer] Patient Information 2015 [employer]. This information is not intended to replace advice given to you by your health care provider. Make sure you  discuss any questions you have with your health care provider.  

## 2015-02-06 NOTE — ED Provider Notes (Signed)
CSN: [id number]     Arrival date & time 02/06/15  0120 History   First MD Initiated Contact with Patient 02/06/15 0142     Chief Complaint  Patient presents with  . Back Pain     (Consider location/radiation/quality/duration/timing/severity/associated sxs/prior Treatment) HPI Comments: Patient is a 64 year old female with a history of ACS, CAD, hypertension, right breast cancer, esophageal reflux, and chronic low back pain secondary to spinal stenosis of the lumbar spine. She presents to the emergency department today via EMS for complaints of low back pain. Asian states that she woke up from sleep at 2310 secondary to worsening back pain. She describes the pain as deep and aching with intermittent sharp, stabbing sensations. Pain intermittently radiated down her posterior left leg. She also reports associated bilateral leg cramping. Patient attempted to apply an ice pack without relief. She also tried soaking in a warm tub, but this did not help her. Patient takes gabapentin for management of her chronic back pain. She forgot to take her nightly dose and did take her prescribed gabapentin upon waking. She reports associated nausea and the urge to void with a decreased urinary stream. She has been able to void since this time without difficulty. Patient denies associated fever, extremity weakness, recent fall or trauma to her low back, vomiting, dysuria or hematuria, or bowel/bladder incontinence. She initially had some decreased sensation in her bilateral lower extremities, but states that her sensation in her legs is now at baseline. She attributes her worsening back pain to cortisone injections she had performed at her pain management clinic on 11/21/2014. She states that her psychiatrist now manages her chronic back pain. This was the physician who started her on gabapentin.  Patient is a 64 y.o. female presenting with back pain. The history is provided by the patient. No language interpreter was  used.  Back Pain Associated symptoms: numbness (subjective)   Associated symptoms: no fever and no weakness     Past Medical History  Diagnosis Date  . Myocardial infarct 2005  . CAD (coronary artery disease)   . Hypertension   . Long term (current) use of anticoagulants   . Edema   . Hematoma   . Sinusitis acute   . Adenocarcinoma of breast     right  . Acute upper respiratory infections of unspecified site   . Depression   . Anal fissure   . GERD (gastroesophageal reflux disease)   . Dog bite(E906.0)   . Diverticulosis of colon (without mention of hemorrhage)   . Spinal stenosis, lumbar region, without neurogenic claudication   . Anxiety   . Panic attacks   . Asthma   . Irritable bowel syndrome   . Hiatal hernia   . Jaundice     Hx of Jaundice at age 70 from "dirty restuarant". Unsure of Hepatitis type  . Neuropathy    Past Surgical History  Procedure Laterality Date  . Partial hysterectomy  1987  . Cardiac catheterization  2001  . Coronary angioplasty with stent placement  2001  . Cholecystectomy    . Bladder repair      tact  . Breast lumpectomy Right   . Breast reconstruction Right   . Breast reduction surgery Left   . Colonoscopy  2013    Diverticulosis  . Cataract extraction    . Esophageal manometry  10/08/2012    Procedure: ESOPHAGEAL MANOMETRY (EM);  Surgeon: Sable Feil, MD;  Location: [facility];  Service: Endoscopy;  Laterality: N/A;  . Esophagogastroduodenoscopy  2014    Normal    Family History  Problem Relation Age of Onset  . Hypertension Mother   . Heart disease Mother   . Dementia Mother   . Arthritis Mother   . Diabetes Mother   . Colon polyps Mother   . Prostate cancer Father   . Hypertension Father   . Colon polyps Father   . Lung cancer Maternal Uncle   . Hypertension Sister   . Hypertension Brother   . Heart disease Sister   . Rectal cancer Neg Hx   . Stomach cancer Neg Hx   . Colon cancer Cousin   . Inflammatory  bowel disease Sister    History  Substance Use Topics  . Smoking status: Former Smoker -- 0.30 packs/day for 8 years    Types: Cigarettes    Quit date: 09/13/1983  . Smokeless tobacco: Never Used  . Alcohol Use: No   OB History    No data available      Review of Systems  Constitutional: Negative for fever.  Gastrointestinal: Positive for nausea. Negative for vomiting.  Genitourinary: Positive for urgency and decreased urine volume.  Musculoskeletal: Positive for back pain.  Neurological: Positive for numbness (subjective). Negative for weakness.  All other systems reviewed and are negative.   Allergies  Adhesive; Amoxicillin; Azithromycin; Bromfed; Cephalexin; Chlordiazepoxide-clidinium; Clotrimazole; Dicyclomine hcl; Effexor; Gatifloxacin; Hydralazine hcl; Ibuprofen; Iohexol; Latex; Lidocaine; Lisinopril; Librax ; Paroxetine; Penicillins; Prednisone; Pregabalin; Propoxyphene n-acetaminophen; Sertraline; Sertraline hcl; Sulfa antibiotics; Sulfadiazine; Tussionex pennkinetic er; and Verapamil  Home Medications   Prior to Admission medications   Medication Sig Start Date End Date Taking? Authorizing Provider  acetaminophen (TYLENOL) 500 MG tablet Take 500 mg by mouth every 6 (six) hours as needed for moderate pain.   Yes Historical Provider, MD  albuterol (PROVENTIL HFA;VENTOLIN HFA) 108 (90 BASE) MCG/ACT inhaler Inhale 2 puffs into the lungs every 4 (four) hours as needed for wheezing or shortness of breath. 07/27/14  Yes Noemi Chapel, MD  ALPRAZolam Duanne Moron) 1 MG tablet Take 0.5-1 tablets (0.5-1 mg total) by mouth 2 (two) times daily as needed for anxiety or sleep. 12/08/14  Yes Tresa Garter, MD  ascorbic acid (VITAMIN C) 1000 MG tablet Take 500 mg by mouth daily.    Yes Historical Provider, MD  aspirin EC 81 MG tablet Take 81 mg by mouth daily.   Yes Historical Provider, MD  Calcium Carbonate-Vitamin D (CALCIUM-CARB 600 + D) 600-125 MG-UNIT TABS Take 1 capsule by mouth  daily.    Yes Historical Provider, MD  cetirizine (ZYRTEC) 10 MG tablet Take 1 tablet (10 mg total) by mouth daily. 02/05/15  Yes Tresa Garter, MD  cyclobenzaprine (FLEXERIL) 10 MG tablet Take 1 tablet (10 mg total) by mouth 2 (two) times daily. 01/22/15  Yes Tiffany Daneil Dan, PA-C  cycloSPORINE (RESTASIS) 0.05 % ophthalmic emulsion Place 1 drop into both eyes 2 (two) times daily.     Yes Historical Provider, MD  Fluticasone-Salmeterol (ADVAIR) 250-50 MCG/DOSE AEPB Inhale 1 puff into the lungs 2 (two) times daily. Patient taking differently: Inhale 1 puff into the lungs 2 (two) times daily as needed.  03/24/14  Yes Tresa Garter, MD  gabapentin (NEURONTIN) 100 MG capsule Take 100 mg by mouth 2 (two) times daily.   Yes Historical Provider, MD  hydrochlorothiazide (HYDRODIURIL) 12.5 MG tablet Take 1 tablet (12.5 mg total) by mouth daily. 12/08/14  Yes Tresa Garter, MD  metoprolol (LOPRESSOR) 50 MG tablet Take 1 tablet (50  mg total) by mouth daily. 11/03/14  Yes Tresa Garter, MD  oxymetazoline (AFRIN) 0.05 % nasal spray Place 1 spray into both nostrils 2 (two) times daily as needed for congestion.   Yes Historical Provider, MD  pantoprazole (PROTONIX) 40 MG tablet Take 1 tablet (40 mg total) by mouth daily. 01/05/15  Yes Tresa Garter, MD  pravastatin (PRAVACHOL) 40 MG tablet Take 1 tablet (40 mg total) by mouth every evening. 07/14/14  Yes Tresa Garter, MD  promethazine (PHENERGAN) 12.5 MG tablet Take 1 tablet (12.5 mg total) by mouth every 6 (six) hours as needed for nausea or vomiting. 09/21/14  Yes Junius Creamer, NP  valsartan (DIOVAN) 320 MG tablet Take 1 tablet (320 mg total) by mouth daily. 12/08/14  Yes Tresa Garter, MD  vitamin E (VITAMIN E) 400 UNIT capsule Take 400 Units by mouth daily.   Yes Historical Provider, MD  VOLTAREN 1 % GEL Apply 2 g topically 2 (two) times daily as needed (pain).  06/30/13  Yes Historical Provider, MD  doxycycline (VIBRA-TABS) 100  MG tablet Take 1 tablet (100 mg total) by mouth 2 (two) times daily. Patient not taking: Reported on 02/05/2015 01/22/15   Brayton Caves, PA-C  fluconazole (DIFLUCAN) 100 MG tablet 200mg  x 1 day, then 100mg  x 13 days for candida esophagitis. Patient not taking: Reported on 01/22/2015 12/17/14   Jerene Bears, MD  HYDROcodone-acetaminophen (NORCO/VICODIN) 5-325 MG per tablet Take 1 tablet by mouth every 6 (six) hours as needed for moderate pain. 02/06/15   Antonietta Breach, PA-C  sucralfate (CARAFATE) 1 G tablet Take 1 tablet (1 g total) by mouth 4 (four) times daily -  with meals and at bedtime. Patient not taking: Reported on 02/06/2015 11/26/14   Willia Craze, NP   BP 123/64 mmHg  Pulse 88  Temp(Src) 98.3 F (36.8 C) (Oral)  Resp 18  Ht 5\' 2"  (1.575 m)  Wt 163 lb (73.936 kg)  BMI 29.81 kg/m2  SpO2 98%   Physical Exam  Constitutional: She is oriented to person, place, and time. She appears well-developed and well-nourished. No distress.  Nontoxic/nonseptic appearing. Patient pleasant.  HENT:  Head: Normocephalic and atraumatic.  Eyes: Conjunctivae and EOM are normal. No scleral icterus.  Neck: Normal range of motion.  Cardiovascular: Normal rate, regular rhythm and intact distal pulses.   DP and PT pulses 2+ bilaterally  Pulmonary/Chest: Effort normal. No respiratory distress.  Respirations even and unlabored  Abdominal: Soft. She exhibits no distension and no mass. There is no rebound and no guarding.  Soft abdomen. No masses or peritoneal signs. No focal TTP.  Genitourinary:  Normal rectal tone on GU exam.  Musculoskeletal: Normal range of motion. She exhibits tenderness.  Tenderness to palpation to the left of the lumbar midline at approximately L5. No bony deformities, step-offs, or crepitus. Patient exhibits good range of motion of her low back. No evidence of trauma or injury such as hematoma, ecchymosis, contusion, or abrasion. No paraspinal muscle spasm noted. Positive straight leg  raise and crossed straight leg raise.  Neurological: She is alert and oriented to person, place, and time. She exhibits normal muscle tone. Coordination normal.  Sensation to light touch intact in bilateral lower extremities. Patellar and Achilles reflexes intact and symmetric. Patient ambulatory in the ED with steady gait.  Skin: Skin is warm and dry. No rash noted. She is not diaphoretic. No erythema. No pallor.  Psychiatric: She has a normal mood and affect. Her behavior  is normal.  Nursing note and vitals reviewed.   ED Course  Procedures (including critical care time) Labs Review Labs Reviewed - No data to display  Imaging Review No results found.   EKG Interpretation None      MDM   Final diagnoses:  Acute exacerbation of chronic low back pain    64 year old female presents to the emergency department for further evaluation of low back pain which woke her from sleep with pain radiating down her posterior LLE. Patient states that she has had similar worsening of back pain in the past associated with known chronic back pain. She attributes recent worsening of her pain to lumbar cortisone injections she had performed at her pain management clinic on 11/21/2014. Patient is afebrile today. She denies any recent fever. She further denies bowel and bladder incontinence. Patient has been able to void in the emergency department 2 without difficulty. She has normal rectal tone. Sensation to light touch intact in bilateral lower extremities. Patient ambulatory in the ED. She denies any trauma or injury to her low back inciting worsening of her symptoms this evening. No indication for further emergent imaging or MRI. No red flags or signs concerning for cauda equina.  Pain well controlled with fentanyl. Patient takes gabapentin daily for management of her low back pain. She is no longer being followed by a pain management clinic. She reports that her back pain is now being managed by her  psychiatrist with the use of Gabapentin. Believe patient is stable for discharge at this time with instruction to follow-up with her primary care provider, and psychiatrist as needed, for further evaluation of her symptoms. Short course of pain medication provided. Return precautions also given. Patient agreeable to plan with no unaddressed concerns. Patient discharged in good condition; VSS.   Filed Vitals:   02/06/15 0128 02/06/15 0332 02/06/15 0605  BP: 158/94 148/80 123/64  Pulse: 95 97 88  Temp: 98.3 F (36.8 C)    TempSrc: Oral    Resp: 16 16 18   Height: 5\' 2"  (1.575 m)    Weight: 163 lb (73.936 kg)    SpO2: 99% 99% 98%     Antonietta Breach, PA-C 02/06/15 0938  Varney Biles, MD 02/06/15 2302

## 2015-02-06 NOTE — ED Notes (Signed)
Pt bladder scanned per PA request. Pt had 581cc urine on scanned. Pt reported she had to urinate. Placed on bed pan. Pt able to use both legs to lift herself to place the bedpan underneath her. Pt urinated 400cc, with a post void bladder scan of 136cc. Pt states that she had last urinated just prior to EMS arrival and urinary incontinence occurred on their arrival. Pt reports she still feels like she is incontinent, dry underwear noted.

## 2015-02-06 NOTE — ED Notes (Signed)
Arrives via EMS. Pt reports lower back pain (chronic for the pt), pt was awoken with more severe back pain around 2300. Pt states that since just PTA by ems, she has had numbness in both of her legs and feels like she has to urinate, but cannot control her bladder. Pt has also had vomiting. Pt hypertensive/tachycardic en route to ED. Given Fentanyl en route.

## 2015-02-15 ENCOUNTER — Encounter (HOSPITAL_COMMUNITY): Payer: Self-pay | Admitting: Emergency Medicine

## 2015-02-15 ENCOUNTER — Emergency Department (HOSPITAL_COMMUNITY)
Admission: EM | Admit: 2015-02-15 | Discharge: 2015-02-15 | Disposition: A | Discharge status: Home / Self Care | Payer: Medicare Other | Attending: Emergency Medicine | Admitting: Emergency Medicine

## 2015-02-15 DIAGNOSIS — I252 Old myocardial infarction: Secondary | ICD-10-CM | POA: Insufficient documentation

## 2015-02-15 DIAGNOSIS — M549 Dorsalgia, unspecified: Secondary | ICD-10-CM

## 2015-02-15 DIAGNOSIS — Z880 Allergy status to penicillin: Secondary | ICD-10-CM | POA: Insufficient documentation

## 2015-02-15 DIAGNOSIS — R110 Nausea: Secondary | ICD-10-CM | POA: Insufficient documentation

## 2015-02-15 DIAGNOSIS — Z91040 Latex allergy status: Secondary | ICD-10-CM | POA: Diagnosis not present

## 2015-02-15 DIAGNOSIS — F410 Panic disorder [episodic paroxysmal anxiety] without agoraphobia: Secondary | ICD-10-CM | POA: Diagnosis not present

## 2015-02-15 DIAGNOSIS — I10 Essential (primary) hypertension: Secondary | ICD-10-CM | POA: Diagnosis not present

## 2015-02-15 DIAGNOSIS — Z853 Personal history of malignant neoplasm of breast: Secondary | ICD-10-CM | POA: Diagnosis not present

## 2015-02-15 DIAGNOSIS — Z7901 Long term (current) use of anticoagulants: Secondary | ICD-10-CM | POA: Diagnosis not present

## 2015-02-15 DIAGNOSIS — Z87828 Personal history of other (healed) physical injury and trauma: Secondary | ICD-10-CM | POA: Insufficient documentation

## 2015-02-15 DIAGNOSIS — Z87891 Personal history of nicotine dependence: Secondary | ICD-10-CM | POA: Diagnosis not present

## 2015-02-15 DIAGNOSIS — M545 Low back pain: Secondary | ICD-10-CM | POA: Insufficient documentation

## 2015-02-15 DIAGNOSIS — M79605 Pain in left leg: Secondary | ICD-10-CM | POA: Insufficient documentation

## 2015-02-15 DIAGNOSIS — K219 Gastro-esophageal reflux disease without esophagitis: Secondary | ICD-10-CM | POA: Insufficient documentation

## 2015-02-15 DIAGNOSIS — J45909 Unspecified asthma, uncomplicated: Secondary | ICD-10-CM | POA: Insufficient documentation

## 2015-02-15 DIAGNOSIS — Z79899 Other long term (current) drug therapy: Secondary | ICD-10-CM | POA: Diagnosis not present

## 2015-02-15 DIAGNOSIS — Z7982 Long term (current) use of aspirin: Secondary | ICD-10-CM | POA: Insufficient documentation

## 2015-02-15 DIAGNOSIS — G8929 Other chronic pain: Secondary | ICD-10-CM | POA: Insufficient documentation

## 2015-02-15 DIAGNOSIS — Z7951 Long term (current) use of inhaled steroids: Secondary | ICD-10-CM | POA: Insufficient documentation

## 2015-02-15 DIAGNOSIS — M79604 Pain in right leg: Secondary | ICD-10-CM | POA: Diagnosis not present

## 2015-02-15 DIAGNOSIS — Z9861 Coronary angioplasty status: Secondary | ICD-10-CM | POA: Diagnosis not present

## 2015-02-15 HISTORY — DX: Pain, unspecified: R52

## 2015-02-15 HISTORY — DX: Headache: R51

## 2015-02-15 HISTORY — DX: Other chronic pain: G89.29

## 2015-02-15 HISTORY — DX: Headache, unspecified: R51.9

## 2015-02-15 HISTORY — DX: Radiculopathy, lumbar region: M54.16

## 2015-02-15 HISTORY — DX: Dorsalgia, unspecified: M54.9

## 2015-02-15 LAB — URINALYSIS, ROUTINE W REFLEX MICROSCOPIC
BILIRUBIN URINE: NEGATIVE
Glucose, UA: NEGATIVE mg/dL
Hgb urine dipstick: NEGATIVE
KETONES UR: NEGATIVE mg/dL
LEUKOCYTES UA: NEGATIVE
Nitrite: NEGATIVE
PH: 7 (ref 5.0–8.0)
PROTEIN: NEGATIVE mg/dL
Specific Gravity, Urine: 1.008 (ref 1.005–1.030)
UROBILINOGEN UA: 0.2 mg/dL (ref 0.0–1.0)

## 2015-02-15 MED ORDER — ONDANSETRON 8 MG PO TBDP
8.0000 mg | ORAL_TABLET | Freq: Once | ORAL | Status: AC
  Administered 2015-02-15: 8 mg via ORAL
  Filled 2015-02-15: qty 1

## 2015-02-15 MED ORDER — OXYCODONE-ACETAMINOPHEN 5-325 MG PO TABS: ORAL_TABLET | ORAL | Status: DC

## 2015-02-15 MED ORDER — METHOCARBAMOL 500 MG PO TABS: 1000.0000 mg | ORAL_TABLET | Freq: Four times a day (QID) | ORAL | Status: DC | PRN

## 2015-02-15 MED ORDER — METHOCARBAMOL 500 MG PO TABS
1000.0000 mg | ORAL_TABLET | Freq: Once | ORAL | Status: AC
  Administered 2015-02-15: 1000 mg via ORAL
  Filled 2015-02-15: qty 2

## 2015-02-15 MED ORDER — FENTANYL CITRATE (PF) 100 MCG/2ML IJ SOLN
100.0000 ug | Freq: Once | INTRAMUSCULAR | Status: AC
  Administered 2015-02-15: 100 ug via INTRAMUSCULAR
  Filled 2015-02-15: qty 2

## 2015-02-15 NOTE — Discharge Instructions (Signed)
°Emergency Department Resource Guide °1) Find a Doctor and Pay Out of Pocket °Although you won't have to find out who is covered by your insurance plan, it is a good idea to ask around and get recommendations. You will then need to call the office and see if the doctor you have chosen will accept you as a new patient and what types of options they offer for patients who are self-pay. Some doctors offer discounts or will set up payment plans for their patients who do not have insurance, but you will need to ask so you aren't surprised when you get to your appointment. ° °2) Contact Your Local Health Department °Not all health departments have doctors that can see patients for sick visits, but many do, so it is worth a call to see if yours does. If you don't know where your local health department is, you can check in your phone book. The CDC also has a tool to help you locate your state's health department, and many state websites also have listings of all of their local health departments. ° °3) Find a Walk-in Clinic °If your illness is not likely to be very severe or complicated, you may want to try a walk in clinic. These are popping up all over the country in pharmacies, drugstores, and shopping centers. They're usually staffed by nurse practitioners or physician assistants that have been trained to treat common illnesses and complaints. They're usually fairly quick and inexpensive. However, if you have serious medical issues or chronic medical problems, these are probably not your best option. ° °No Primary Care Doctor: °- Call [facility] at  832-8000 - they can help you locate a primary care doctor that  accepts your insurance, provides certain services, etc. °- Physician Referral Service- 1-800-533-3463 ° °Chronic Pain Problems: °Organization         Address  Phone   Notes  °[facility]  (336) [phone] Patients need to be referred by their primary care doctor.  ° °Medication  Assistance: °Organization         Address  Phone   Notes  °[place] [facility] [address] °[place], [place] [address] (336) 641-8030 --Must be a resident of [place] °-- Must have NO insurance coverage whatsoever (no Medicaid/ Medicare, etc.) °-- The pt. MUST have a primary care doctor that directs their care regularly and follows them in the community °  °[facility]  (866) 331-1348   °[employer]  (888) 892-1162   ° °Agencies that provide inexpensive medical care: °Organization         Address  Phone   Notes  °[facility]  (336) 832-8035   °[facility]    (336) 832-7272   °[facility] [address] °[place], [place] [address] (336) 832-4777   °[facility] [address], °[place] (336) 271-4999   °[facility]    (336) 373-0678   °[facility]    (336) 272-1050   °[facility] ° [address], [place] Phone:  (336) 832-4444, Fax:  (336) 832-4440 Hours of Operation:  9 am - 6 pm, M-F.  Also accepts Medicaid/Medicare and self-pay.  °[facility] ° [address], [place] Phone: (336) [phone], Fax: (336) [phone]. Hours of Operation:  8:30 am - 5:30 pm, M-F.  Also accepts Medicaid and self-pay.  °[facility] [place] [address], [place] Phone: (336) 878-6027   °[facility] [address], [place], [place] (336)723-1848, Ext. 123 Mondays & Thursdays: 7-9 AM.  First 15 patients are seen on a first come, first serve basis. °  ° °Medicaid-accepting [place] Providers: ° °Organization         Address  Phone   Notes  °[facility] [address], [place] (336) 641-2100 Also accepts self-pay patients.  °[facility] [address], [place] ° (336) 856-9996   °[facility] [address], [place]  (336) 288-8857   °[facility] [address] [place] [address], [place] (336) 299-7000   °Veita Bland [address], [place]  ° (336) 373-1557 Only accepts [place] Access Medicaid patients after they have their name applied to their card.  ° °Self-Pay (no insurance) in [place]: ° °Organization         Address  Phone   Notes  °Sickle Cell Patients, [facility] [address], [place] (336) 832-1970   °[facility] [address], [place] (336) 832-4400   °[facility] ° [address], [place] (336) 992-4800   °[facility]/Dr. Osei-Bonsu ° [address] [place] [address], [place] or [address], [place] (336) [phone] Phone number for both [place] and [place] locations is the same.  °[facility] [address], [place] (336) 299-0000   °[facility] [address] [place] [address], [place] or [address] (336) 852-7530 °(336) 878-2260   °[facility] [address], [place] (336) 350-1642, phone; (336) 294-5005, fax Sees patients 1st and 3rd Saturday of every month.  Must not qualify for public or private insurance (i.e. Medicaid, Medicare, [place] Health Choice, Veterans' Benefits) • Household income should be no more than 200% of the poverty level •The clinic cannot treat you if you are pregnant or think you are pregnant • Sexually transmitted diseases are not treated at the clinic.  ° ° °Dental Care: °Organization         Address  Phone  Notes  °[place] [facility] [address], [place] (336) 641-6152 Accepts children up to age 21 who are enrolled in Medicaid or [place] Health Choice; pregnant women with a Medicaid card; and children who have applied for Medicaid or [place] Health Choice, but were declined, whose parents can pay a reduced fee at time of service.  °[place]  [facility] [place]  [address], [place] (336) 641-7733 Accepts children up to age 21 who are enrolled in Medicaid or [place] Health Choice; pregnant women with a Medicaid card; and children who have applied for Medicaid or [place] Health Choice, but were declined, whose parents can pay a reduced fee at time of service.  °[facility] ° [address], [place] (336) 641-4533 Patients are seen by appointment only. Walk-ins are not accepted. [facility] will see patients 18 years of age and older. °Monday - Tuesday (8am-5pm) °Most Wednesdays (8:30-5pm) °$30 per visit, cash only  °[facility] ° [address], [place] (336) 641-4533 Patients are seen by appointment only. Walk-ins are not accepted. [facility] will see patients 18 years of age and older. °One   Wednesday Evening (Monthly: Volunteer Based).  $30 per visit, cash only  °[facility]  (919) [phone] for adults; Children under age 4, call Graduate Pediatric Dentistry at ([phone]) [phone]. Children aged 4-14, please call ([phone]) [phone] to request a pediatric application. ° Dental services are provided in all areas of dental care including fillings, crowns and bridges, complete and partial dentures, implants, gum treatment, root canals, and extractions. Preventive care is also provided. Treatment is provided to both adults and children. °Patients are selected via a lottery and there is often a waiting list. °  °[facility] [address], °[place] ° (336) 763-8833 [email or web address] °  °[facility] [address], [place], [place] (336)723-1848, Ext. 123 Second and Fourth Thursday of each month, opens at 6:30 AM; Clinic ends at 9 AM.  Patients are seen on a first-come first-served basis, and a limited number are seen during each clinic.  ° °[facility] ° [address], [place], [place] (336) 723-7904    Eligibility Requirements °You must have lived in [place], [place], or [place] counties for at least the last three months. °  You cannot be eligible for state or federal sponsored healthcare insurance, including Veterans Administration, Medicaid, or Medicare. °  You generally cannot be eligible for healthcare insurance through your employer.  °  How to apply: °Eligibility screenings are held every Tuesday and Wednesday afternoon from 1:00 pm until 4:00 pm. You do not need an appointment for the interview!  °[facility] [address], [place], [place] 336-631-2330   °[facility]  336-342-8273   °[facility]  336-703-3100   °[facility]  336-570-6415   ° °Behavioral Health Resources in the Community: °Intensive Outpatient Programs °Organization         Address  Phone  Notes  °[facility] [address], [place], [place] 336-878-6098   °[facility] [address], [place], [place] 336-832-9800   °[facility] [address], [place], [name] ° 336-882-2125   °[facility] [address],  °[place], [place] 1-800-853-5163 or 336-641-4981   °Substance Abuse Resources °Organization         Address  Phone  Notes  °[facility]  336-882-2125   °[facility]  336-784-9470   °[facility]  336-285-9073   °[facility]  336-845-3988   °Residential & Outpatient Substance Abuse Program  1-800-659-3381   °Psychological Services °Organization         Address  Phone  Notes  °[facility]  336- 832-9600   °[facility]  336- 378-7881   °[facility] [address], [place] 1-800-853-5163 or 336-641-4981   ° °Mobile Crisis Teams °Organization         Address  Phone  Notes  °[facility], Mobile Crisis Care Unit  1-877-626-1772   °Assertive °[facility] ° [address]  [place], [place] 336-834-9664   °Sharon DeEsch [address] °[place] 336-554-5454   ° °Self-Help/Support Groups °Organization         Address  Phone             Notes  °[facility] [place] [facility] - variety of support groups  336- 373-1402 Call for more information  °Narcotics Anonymous (NA), [facility] [address], °[place]  2 meetings at this location  ° °  Residential Treatment Programs Organization         Address  Phone  Notes  [facility] [address]    [place]  1-540 550 3774   [facility]  [address], [place] [address], [place], [place]   [facility] [place], [place] (657)449-4392 Admissions: 8am-3pm M-F  [facility] [place] [address]    [place], [place] 341-937-9024   [facility] [address] [place], [place], [place]   [facility] [place] [address]  [place], [place]   [facility] Intensive Outpatient [address], [place], [place]   [facility] [address]  [place], [place] 1-414-790-8070 or 607-198-8769   [facility] [address], [place], [place] Accepts Medicaid  [facility] [place] [address],  [place] 1-(770) 152-8946 Substance Abuse/Addiction Treatment   [facility]         Address  Phone  Notes  [facility]  (757)148-5832   Domenic Schwab, PhD [address] [place], [place]   (424) 066-5026 or 629-568-7935   [place], [place] 249-718-3766   [facility] [address], [place], [place] [phone] Insurance/Medicaid/sponsorship through [facility] [address]                                    [place], [place] [phone] [place] [address], [place] [phone]    Dr. Adele Schilder  724-622-6583   [facility] Dept. 1) [address] [place] 2) [place] 3)  [address], [place] 248-487-4075 [phone]) [phone]  [phone]   [place] 415-112-1506 or 334-660-0941 (After Hours)      Take the prescriptions as directed.  Apply moist heat or ice to the area(s) of discomfort, for 15 minutes at a time, several times per day for the next few days.  Do not fall asleep on a heating or ice pack.  Call your regular medical doctor and your Pain Management doctor tomorrow to schedule a follow up appointment this week.  Return to the Emergency Department immediately if worsening.

## 2015-02-15 NOTE — ED Provider Notes (Signed)
CSN: [id number]     Arrival date & time 02/15/15  1106 History   First MD Initiated Contact with Patient 02/15/15 1201     Chief Complaint  Patient presents with  . Back Pain  . Leg Pain  . Nausea      HPI Pt was seen at 1215. Per pt, c/o gradual onset and persistence of constant acute flair of her chronic low back "pain" for the past 6 months.  Denies any change in her usual chronic pain pattern, with radiation into her bilat LE's. States she was "getting up at church" today which caused an acute flair of her chronic pain. States the pain makes her nauseated, per her usual chronic symptom pattern. Pain worsens with palpation of the area and body position changes. Pt states she has previously received "injections" for her pain by her Pain Management doctor, as well as "talking about placing one of those stimulators in my back." Denies incont/retention of bowel or bladder, no saddle anesthesia, no focal motor weakness, no tingling/numbness in extremities from baseline, no fevers, no injury, no abd pain.   The symptoms have been associated with no other complaints. The patient has a significant history of similar symptoms previously, recently being evaluated for this complaint and multiple prior evals for same.     Past Medical History  Diagnosis Date  . Myocardial infarct 2005  . CAD (coronary artery disease)   . Hypertension   . Long term (current) use of anticoagulants   . Edema   . Hematoma   . Sinusitis acute   . Adenocarcinoma of breast     right  . Acute upper respiratory infections of unspecified site   . Depression   . Anal fissure   . GERD (gastroesophageal reflux disease)   . Dog bite(E906.0)   . Diverticulosis of colon (without mention of hemorrhage)   . Spinal stenosis, lumbar region, without neurogenic claudication   . Anxiety   . Panic attacks   . Asthma   . Irritable bowel syndrome   . Hiatal hernia   . Jaundice     Hx of Jaundice at age 33 from "dirty  restuarant". Unsure of Hepatitis type  . Neuropathy     bilat LE's  . Chronic back pain   . Lumbar radiculopathy     bilat LE's  . Pain management   . Chronic pain   . Headache    Past Surgical History  Procedure Laterality Date  . Partial hysterectomy  1987  . Cardiac catheterization  2001  . Coronary angioplasty with stent placement  2001  . Cholecystectomy    . Bladder repair      tact  . Breast lumpectomy Right   . Breast reconstruction Right   . Breast reduction surgery Left   . Colonoscopy  2013    Diverticulosis  . Cataract extraction    . Esophageal manometry  10/08/2012    Procedure: ESOPHAGEAL MANOMETRY (EM);  Surgeon: Sable Feil, MD;  Location: [facility];  Service: Endoscopy;  Laterality: N/A;  . Esophagogastroduodenoscopy  2014    Normal    Family History  Problem Relation Age of Onset  . Hypertension Mother   . Heart disease Mother   . Dementia Mother   . Arthritis Mother   . Diabetes Mother   . Colon polyps Mother   . Prostate cancer Father   . Hypertension Father   . Colon polyps Father   . Lung cancer Maternal Uncle   .  Hypertension Sister   . Hypertension Brother   . Heart disease Sister   . Rectal cancer Neg Hx   . Stomach cancer Neg Hx   . Colon cancer Cousin   . Inflammatory bowel disease Sister    History  Substance Use Topics  . Smoking status: Former Smoker -- 0.30 packs/day for 8 years    Types: Cigarettes    Quit date: 09/13/1983  . Smokeless tobacco: Never Used  . Alcohol Use: No    Review of Systems ROS: Statement: All systems negative except as marked or noted in the HPI; Constitutional: Negative for fever and chills. ; ; Eyes: Negative for eye pain, redness and discharge. ; ; ENMT: Negative for ear pain, hoarseness, nasal congestion, sinus pressure and sore throat. ; ; Cardiovascular: Negative for chest pain, palpitations, diaphoresis, dyspnea and peripheral edema. ; ; Respiratory: Negative for cough, wheezing and  stridor. ; ; Gastrointestinal: +nausea. Negative for vomiting, diarrhea, abdominal pain, blood in stool, hematemesis, jaundice and rectal bleeding. . ; ; Genitourinary: Negative for dysuria, flank pain and hematuria. ; ; Musculoskeletal: +LBP. Negative for neck pain. Negative for swelling and trauma.; ; Skin: Negative for pruritus, rash, abrasions, blisters, bruising and skin lesion.; ; Neuro: Negative for headache, lightheadedness and neck stiffness. Negative for weakness, altered level of consciousness , altered mental status, extremity weakness, involuntary movement, seizure and syncope.      Allergies  Adhesive; Amoxicillin; Azithromycin; Bromfed; Cephalexin; Chlordiazepoxide-clidinium; Clotrimazole; Dicyclomine hcl; Effexor; Gatifloxacin; Hydralazine hcl; Ibuprofen; Iohexol; Latex; Lidocaine; Lisinopril; Librax ; Paroxetine; Penicillins; Prednisone; Pregabalin; Propoxyphene n-acetaminophen; Sertraline; Sertraline hcl; Sulfa antibiotics; Sulfadiazine; Tussionex pennkinetic er; and Verapamil  Home Medications   Prior to Admission medications   Medication Sig Start Date End Date Taking? Authorizing Provider  acetaminophen (TYLENOL) 500 MG tablet Take 500 mg by mouth every 6 (six) hours as needed for moderate pain.   Yes Historical Provider, MD  albuterol (PROVENTIL HFA;VENTOLIN HFA) 108 (90 BASE) MCG/ACT inhaler Inhale 2 puffs into the lungs every 4 (four) hours as needed for wheezing or shortness of breath. 07/27/14  Yes Noemi Chapel, MD  ALPRAZolam Duanne Moron) 1 MG tablet Take 0.5-1 tablets (0.5-1 mg total) by mouth 2 (two) times daily as needed for anxiety or sleep. 12/08/14  Yes Tresa Garter, MD  ascorbic acid (VITAMIN C) 1000 MG tablet Take 500 mg by mouth daily.    Yes Historical Provider, MD  aspirin EC 81 MG tablet Take 81 mg by mouth daily.   Yes Historical Provider, MD  Calcium Carbonate-Vitamin D (CALCIUM-CARB 600 + D) 600-125 MG-UNIT TABS Take 1 capsule by mouth daily.    Yes  Historical Provider, MD  cetirizine (ZYRTEC) 10 MG tablet Take 1 tablet (10 mg total) by mouth daily. 02/05/15  Yes Tresa Garter, MD  cyclobenzaprine (FLEXERIL) 10 MG tablet Take 1 tablet (10 mg total) by mouth 2 (two) times daily. 01/22/15  Yes Tiffany Daneil Dan, PA-C  cycloSPORINE (RESTASIS) 0.05 % ophthalmic emulsion Place 1 drop into both eyes 2 (two) times daily.     Yes Historical Provider, MD  Fluticasone-Salmeterol (ADVAIR) 250-50 MCG/DOSE AEPB Inhale 1 puff into the lungs 2 (two) times daily. Patient taking differently: Inhale 1 puff into the lungs 2 (two) times daily as needed.  03/24/14  Yes Tresa Garter, MD  gabapentin (NEURONTIN) 100 MG capsule Take 100 mg by mouth 2 (two) times daily.   Yes Historical Provider, MD  hydrochlorothiazide (HYDRODIURIL) 12.5 MG tablet Take 1 tablet (12.5 mg total) by  mouth daily. 12/08/14  Yes Tresa Garter, MD  HYDROcodone-acetaminophen (NORCO/VICODIN) 5-325 MG per tablet Take 1 tablet by mouth every 6 (six) hours as needed for moderate pain. 02/06/15  Yes Antonietta Breach, PA-C  metoprolol (LOPRESSOR) 50 MG tablet Take 1 tablet (50 mg total) by mouth daily. 11/03/14  Yes Tresa Garter, MD  oxymetazoline (AFRIN) 0.05 % nasal spray Place 1 spray into both nostrils 2 (two) times daily as needed for congestion.   Yes Historical Provider, MD  pantoprazole (PROTONIX) 40 MG tablet Take 1 tablet (40 mg total) by mouth daily. 01/05/15  Yes Tresa Garter, MD  pravastatin (PRAVACHOL) 40 MG tablet Take 1 tablet (40 mg total) by mouth every evening. 07/14/14  Yes Tresa Garter, MD  promethazine (PHENERGAN) 12.5 MG tablet Take 1 tablet (12.5 mg total) by mouth every 6 (six) hours as needed for nausea or vomiting. 09/21/14  Yes Junius Creamer, NP  valsartan (DIOVAN) 320 MG tablet Take 1 tablet (320 mg total) by mouth daily. 12/08/14  Yes Tresa Garter, MD  vitamin E (VITAMIN E) 400 UNIT capsule Take 400 Units by mouth daily.   Yes Historical  Provider, MD  VOLTAREN 1 % GEL Apply 2 g topically 2 (two) times daily as needed (pain).  06/30/13  Yes Historical Provider, MD  doxycycline (VIBRA-TABS) 100 MG tablet Take 1 tablet (100 mg total) by mouth 2 (two) times daily. Patient not taking: Reported on 02/05/2015 01/22/15   Brayton Caves, PA-C  fluconazole (DIFLUCAN) 100 MG tablet 200mg  x 1 day, then 100mg  x 13 days for candida esophagitis. Patient not taking: Reported on 01/22/2015 12/17/14   Jerene Bears, MD  sucralfate (CARAFATE) 1 G tablet Take 1 tablet (1 g total) by mouth 4 (four) times daily -  with meals and at bedtime. Patient not taking: Reported on 02/06/2015 11/26/14   Willia Craze, NP   BP 180/90 mmHg  Pulse 74  Temp(Src) 98.2 F (36.8 C) (Oral)  Resp 16  SpO2 100% Physical Exam  1220: Physical examination:  Nursing notes reviewed; Vital signs and O2 SAT reviewed;  Constitutional: Well developed, Well nourished, Well hydrated, In no acute distress; Head:  Normocephalic, atraumatic; Eyes: EOMI, PERRL, No scleral icterus; ENMT: Mouth and pharynx normal, Mucous membranes moist; Neck: Supple, Full range of motion, No lymphadenopathy; Cardiovascular: Regular rate and rhythm, No gallop; Respiratory: Breath sounds clear & equal bilaterally, No wheezes.  Speaking full sentences with ease, Normal respiratory effort/excursion; Chest: Nontender, Movement normal; Abdomen: Soft, Nontender, Nondistended, Normal bowel sounds; Genitourinary: No CVA tenderness; Spine:  No midline CS, TS, LS tenderness. +TTP bilat lumbar paraspinal muscles. No rash, no ecchymosis, no abrasions..;;  Extremities: Pulses normal, No tenderness, No edema, No calf edema or asymmetry.; Neuro: AA&Ox3, rambling historian. Major CN grossly intact.  Speech clear. No gross focal motor or sensory deficits in extremities. Strength 5/5 equal bilat UE's and LE's, including great toe dorsiflexion.  DTR 2/4 equal bilat UE's and LE's.  No gross sensory deficits.  +SLR right, neg  straight leg raise left. Pt able to sit herself up from laying down without assistance.; Skin: Color normal, Warm, Dry.   ED Course  Procedures    1497:  Pt insistent she "needs another MRI." EPIC chart, as well as Care Everywhere records reviewed: MRI from 3 months ago reassuring. [facility] note from 12/08/2014 makes note pt's MRI-LS 2016 and 2014 have not had significant change since 2013. Pt has voided in the  ED without difficulty. Pt is afebrile with stable VS. Neuro exam without change since Pain Management ofc note (above date), as well as ED visit 9 days ago. No indication for repeat MRI at this time. This thoroughly explained to pt and her family who verb understanding.   1400:  Pt states she "feels better" after meds and wants to go home now. Pt states she has an appt with her PMD in 4 days. Long hx of chronic pain with multiple ED visits for same.  Pt endorses acute flair of her usual long standing chronic pain today, no change from her usual chronic pain pattern.  Pt encouraged to f/u with her PMD and Pain Management doctor for good continuity of care and control of her chronic pain.  Verb understanding.     MDM  MDM Reviewed: previous chart, nursing note and vitals Reviewed previous: MRI Interpretation: labs   CLINICAL DATA: Lumbar radiculopathy. Acute onset of low back and bilateral leg pain approximately 1 week ago.  EXAM: MRI LUMBAR SPINE WITHOUT CONTRAST  TECHNIQUE: Multiplanar, multisequence MR imaging of the lumbar spine was performed. No intravenous contrast was administered.  COMPARISON: 07/28/2013  FINDINGS: There is transitional lumbosacral anatomy. Numbering on lumbar spine MRIs has varied in the past. When correlating with prior cervical and thoracic spine imaging, there are 12 rib-bearing thoracic type vertebral bodies followed by 4 non rib-bearing lumbar type vertebral bodies and a transitional segment. This will be considered a  partially sacralized L5 with a rudimentary L5-S1 disc space for numbering purposes on this examination. Please note that this represents a change from the 2 most recent prior lumbar spine MRIs but is consistent with the numbering employed on 12/17/2009 cervical and thoracic spine studies and with the 03/02/2009 lumbar spine MRI.  Vertebral alignment is unchanged without listhesis. There is slight right convex curvature of the lumbar spine. Vertebral body heights are preserved. There is diffuse lumbar disc desiccation with relative preservation of disc space height. No vertebral marrow edema is seen. Conus medullaris terminates at the mid L1 level. Paraspinal soft tissues are unremarkable.  L1-2: At most minimal disc bulging without stenosis, unchanged.  L2-3: Mild circumferential disc bulging results in mild bilateral lateral recess narrowing, unchanged. No significant spinal canal or neural foraminal stenosis.  L3-4: Mild circumferential disc bulging and mild facet and ligamentum flavum hypertrophy result in mild bilateral lateral recess narrowing and mild left neural foraminal narrowing, unchanged. No significant spinal stenosis.  L4-5: Mild circumferential disc bulging and mild-to-moderate facet and ligamentum flavum hypertrophy result in mild bilateral lateral recess narrowing and mild bilateral neural foraminal narrowing, unchanged. Borderline spinal stenosis. Left foraminal annular fissure is again noted.  L5-S1: Rudimentary disc. No stenosis.  IMPRESSION: 1. Transitional lumbosacral anatomy as above. 2. Unchanged, mild multilevel lumbar disc degeneration and facet arthrosis with mild lateral recess and neural foraminal narrowing as above but no clear neural impingement.  Electronically Signed  By: Logan Bores  On: 11/06/2014 08:28    Results for orders placed or performed during the hospital encounter of 02/15/15  Urinalysis, Routine w reflex microscopic  Result Value  Ref Range   Color, Urine YELLOW YELLOW   APPearance CLEAR CLEAR   Specific Gravity, Urine 1.008 1.005 - 1.030   pH 7.0 5.0 - 8.0   Glucose, UA NEGATIVE NEGATIVE mg/dL   Hgb urine dipstick NEGATIVE NEGATIVE   Bilirubin Urine NEGATIVE NEGATIVE   Ketones, ur NEGATIVE NEGATIVE mg/dL   Protein, ur NEGATIVE NEGATIVE mg/dL   Urobilinogen, UA  0.2 0.0 - 1.0 mg/dL   Nitrite NEGATIVE NEGATIVE   Leukocytes, UA NEGATIVE NEGATIVE       Francine Graven, DO 02/19/15 1450

## 2015-02-15 NOTE — ED Notes (Signed)
Pt c/o worsening chronic "spine" pain and bilateral leg and foot pain. Pt has experienced these symptoms x 6 months but sts that getting up at church today made things worse. Pt was seen about 10 days ago for the same. Pt also c/o Nausea, no vomiting. Pt c/o numbness in back and legs but is able to feel this RN palpate both. A&Ox4. Pt seems to be a poor historian and does not directly answer questions when asked.

## 2015-02-15 NOTE — ED Notes (Signed)
Pt reports she is ready to go home. Sts the pain is getting better, and she knows it will not go away completely. Informed EDP of pt's d/c request.

## 2015-02-16 ENCOUNTER — Encounter: Payer: Self-pay | Admitting: Internal Medicine

## 2015-02-16 ENCOUNTER — Ambulatory Visit (INDEPENDENT_AMBULATORY_CARE_PROVIDER_SITE_OTHER): Payer: Medicare Other | Admitting: Internal Medicine

## 2015-02-16 VITALS — BP 156/80 | HR 72 | Ht 62.0 in | Wt 167.0 lb

## 2015-02-16 DIAGNOSIS — K2970 Gastritis, unspecified, without bleeding: Secondary | ICD-10-CM | POA: Diagnosis not present

## 2015-02-16 DIAGNOSIS — K5900 Constipation, unspecified: Secondary | ICD-10-CM

## 2015-02-16 DIAGNOSIS — I159 Secondary hypertension, unspecified: Secondary | ICD-10-CM

## 2015-02-16 DIAGNOSIS — K589 Irritable bowel syndrome without diarrhea: Secondary | ICD-10-CM

## 2015-02-16 DIAGNOSIS — K219 Gastro-esophageal reflux disease without esophagitis: Secondary | ICD-10-CM | POA: Diagnosis not present

## 2015-02-16 MED ORDER — POLYETHYLENE GLYCOL 3350 17 GM/SCOOP PO POWD: ORAL | Status: DC

## 2015-02-16 NOTE — Patient Instructions (Addendum)
Continue taking Pantoprazole.  Begin using Miralax daily - 17gm in water or juice.  Coupon given.  Follow up with your pcp regarding your back.   I appreciate the opportunity to care for you.

## 2015-02-16 NOTE — Progress Notes (Signed)
Subjective:    Patient ID: [name], female    DOB: [birth year], 64 y.o.   MRN: [id number]  HPI  [name] is a 64 yo female with PMH of IBS, history of constipation, abdominal bloating, history of gastritis, history of Candida esophagitis and dysphagia who is here for follow-up. She was seen by Tye Savoy in March and upper endoscopy was arranged and performed on 12/17/2014. This showed Candida esophagitis with irregular Z line. Empiric dilation was performed across the GE junction up to 18 mm. There was patchy area in the gastric body with inflammation and biopsies were obtained to exclude polyp. The duodenum was unremarkable. These biopsies showed ulcerated and eroded gastric mucosa with no H. pylori, metaplasia or dysplasia or malignancy. She was treated with fluconazole for Candida esophagitis for 14 days and her PPI was continued. She is taking pantoprazole 40 mg daily.  Her biggest complaint recently is been lower back pain and also lower extremity numbness tingling and heaviness. This is bilateral. Recently seen in the ER. She feels it is related to spinal sterile injection complication. She has follow-up with primary care later this week. She is hoping for another MRI.  After procedure her dysphagia has improved dramatically. She denies odynophagia. She is eating better with less epigastric abdominal pain. Occasionally she still does feel postprandial epigastric bloating. She is using MiraLAX 17 g daily which is working well for her constipation without causing diarrhea. Previously she was taking Linzess which was working at a time but she has been off this for some time.  Her last colonoscopy was in August 2013 which revealed moderate left-sided diverticulosis but was otherwise normal, 10 year repeat was recommended  Review of Systems As per HPI, otherwise negative  Current Medications, Allergies, Past Medical History, Past Surgical History, Family History and Social History  were reviewed in [employer] record.     Objective:   Physical Exam BP 156/80 mmHg  Pulse 72  Ht 5\' 2"  (1.575 m)  Wt 167 lb (75.751 kg)  BMI 30.54 kg/m2 Constitutional: Well-developed and well-nourished. No distress. HEENT: Normocephalic and atraumatic. Oropharynx is clear and moist. No oropharyngeal exudate. Conjunctivae are normal.  No scleral icterus. Neck: Neck supple. Trachea midline. Cardiovascular: Normal rate, regular rhythm and intact distal pulses.  Pulmonary/chest: Effort normal and breath sounds normal. No wheezing, rales or rhonchi. Abdominal: Soft, nontender, nondistended. Bowel sounds active throughout.  Extremities: no clubbing, cyanosis, or edema Neurological: Alert and oriented to person place and time. Psychiatric: Normal mood and affect. Behavior is normal.  CBC    Component Value Date/Time   WBC 5.0 12/23/2014 1035   WBC 4.6 06/09/2014 1515   RBC 4.59 12/23/2014 1035   RBC 4.36 06/09/2014 1515   HGB 13.8 12/23/2014 1035   HGB 12.9 06/09/2014 1515   HCT 41.9 12/23/2014 1035   HCT 39.9 06/09/2014 1515   PLT 209 12/23/2014 1035   PLT 190 06/09/2014 1515   MCV 91.3 12/23/2014 1035   MCV 91.5 06/09/2014 1515   MCH 30.1 12/23/2014 1035   MCH 29.7 06/09/2014 1515   MCHC 32.9 12/23/2014 1035   MCHC 32.4 06/09/2014 1515   RDW 13.9 12/23/2014 1035   RDW 14.1 06/09/2014 1515   LYMPHSABS 1.9 12/23/2014 1035   LYMPHSABS 1.4 06/09/2014 1515   MONOABS 0.4 12/23/2014 1035   MONOABS 0.3 06/09/2014 1515   EOSABS 0.1 12/23/2014 1035   EOSABS 0.0 06/09/2014 1515   BASOSABS 0.0 12/23/2014 1035   BASOSABS  0.0 06/09/2014 1515    CMP     Component Value Date/Time   NA 141 12/23/2014 1035   NA 145 06/09/2014 1515   K 4.6 12/23/2014 1035   K 3.9 06/09/2014 1515   CL 102 12/23/2014 1035   CL 104 12/07/2012 1341   CO2 31 12/23/2014 1035   CO2 29 06/09/2014 1515   GLUCOSE 114* 12/23/2014 1035   GLUCOSE 126 06/09/2014 1515   GLUCOSE 110*  12/07/2012 1341   BUN 18 12/23/2014 1035   BUN 14.5 06/09/2014 1515   CREATININE 0.97 12/23/2014 1035   CREATININE 0.64 11/17/2014 2345   CREATININE 0.9 06/09/2014 1515   CALCIUM 10.2 12/23/2014 1035   CALCIUM 10.0 06/09/2014 1515   PROT 6.6 12/23/2014 1035   PROT 7.0 06/09/2014 1515   ALBUMIN 4.2 12/23/2014 1035   ALBUMIN 3.9 06/09/2014 1515   AST 18 12/23/2014 1035   AST 16 06/09/2014 1515   ALT 18 12/23/2014 1035   ALT 18 06/09/2014 1515   ALKPHOS 46 12/23/2014 1035   ALKPHOS 72 06/09/2014 1515   BILITOT 0.6 12/23/2014 1035   BILITOT 0.91 06/09/2014 1515   GFRNONAA 63 12/23/2014 1035   GFRNONAA >90 11/17/2014 2345   GFRAA 73 12/23/2014 1035   GFRAA >90 11/17/2014 2345       Assessment & Plan:  64 yo female with PMH of IBS, history of constipation, abdominal bloating, history of gastritis, history of Candida esophagitis and dysphagia who is here for follow-up.  1. Dysphagia and candida esophagitis -- improved with Diflucan treatment and dilation. Diflucan likely helped as much as did dilation. She will continue once daily pantoprazole 40 mg daily. GERD diet and precautions recommended  2. Acute gastritis -- patient remains on pantoprazole. No evidence of H. pylori or dysplasia. The area in the stomach was not polyp by biopsy. I've asked that she avoid NSAIDs when possible as this can lead to gastritis and ulceration  3. IBS with constipation and bloating -- continue MiraLAX 17 g daily. Likely exacerbated by narcotics but she states she avoids narcotics.  4. Back pain -- PCP appointment this week  5. Hypertension -- elevated blood pressure today clinic, she feels this is related the pain. I asked that she discuss this further with primary care this week as scheduled. She voiced understanding  Follow-up as needed

## 2015-02-19 ENCOUNTER — Ambulatory Visit: Payer: Medicare Other | Attending: Internal Medicine | Admitting: Internal Medicine

## 2015-02-19 ENCOUNTER — Encounter: Payer: Self-pay | Admitting: Internal Medicine

## 2015-02-19 DIAGNOSIS — M47816 Spondylosis without myelopathy or radiculopathy, lumbar region: Secondary | ICD-10-CM | POA: Insufficient documentation

## 2015-02-19 DIAGNOSIS — M545 Low back pain, unspecified: Secondary | ICD-10-CM

## 2015-02-19 DIAGNOSIS — M62830 Muscle spasm of back: Secondary | ICD-10-CM | POA: Insufficient documentation

## 2015-02-19 NOTE — Patient Instructions (Signed)

## 2015-02-19 NOTE — Progress Notes (Signed)
Patient states she has cortisone injection which caused paralysis She states she continue to have problems Patient would like repeat MRI   [name], is a 63 y.o. female  [id number]  GYI:[id number]  DOB - Oct 07, 1950  Chief Complaint  Patient presents with  . Back Pain        Subjective:   [name] is a 64 y.o. female here today for a follow up visit. Patient has extensive medical history as listed below but lately has been having worsening low back pain since having surgery at [facility] last year. Now she begins to have lower extremity numbness, tingling and heaviness with severe back pain bilaterally. She has been seen in the ER recently for the same, she is here today because she was advised to get an MRI and can only be ordered by primary care physician. She described an episode of urinary retention that necessitated catheterization while she was in the ED, she is also now complaining of constipation because of non-moving bowel which she thinks is a complication from the surgery. Patient has No headache, No chest pain, No abdominal pain. She denies suicidal ideation or thoughts. She takes her pain medication regularly quit acceptable relief.  Problem  Muscle Spasm of Back  Midline Low Back Pain Without Sciatica    ALLERGIES: Allergies  Allergen Reactions  . Adhesive [Tape] Other (See Comments)    blisters  . Amoxicillin Swelling    Throat Swells  . Azithromycin Swelling    Throat Swelling  . Bromfed Swelling    Throat Swelling  . Cephalexin Swelling    Throat Swelling  . Chlordiazepoxide-Clidinium Swelling    Throat Swelling  . Clotrimazole Other (See Comments) and Hypertension    Patient told me that she couldn't swallow due to the medication  . Dicyclomine Hcl Hives  . Effexor [Venlafaxine] Hives and Itching  . Gatifloxacin     Other reaction(s): Other (See Comments)  . Hydralazine Hcl     Rash and itching  . Ibuprofen     N/V  . Iohexol      Code:  HIVES, Desc: throat swelling no hives 20 yrs ago;needs pre-medication  09/19/07 sg, Onset Date: [id number]   . Latex Swelling    Blisters on Skin  . Lidocaine Hives  . Lisinopril     Other reaction(s): Angioedema (ALLERGY/intolerance)  . Librax  [Chlordiazepoxide-Clidinium]     Other reaction(s): Other (See Comments)  . Paroxetine Swelling    Throat Swelling  . Penicillins Hives  . Prednisone Swelling    Throat swelling  . Pregabalin     Other reaction(s): Other (See Comments) nervousness  . Propoxyphene N-Acetaminophen Swelling    REACTION: swelling in the throat  . Sertraline     Other reaction(s): Other (See Comments)  . Sertraline Hcl Swelling    Throat Swelling  . Sulfa Antibiotics     Other reaction(s): Unknown  . Sulfadiazine Swelling    Throat Swelling  . Tussionex Pennkinetic Er [Hydrocod Polst-Cpm Polst Er] Itching    "Sunburn"  . Verapamil Swelling    Throat Swelling    PAST MEDICAL HISTORY: Past Medical History  Diagnosis Date  . Myocardial infarct 2005  . CAD (coronary artery disease)   . Hypertension   . Long term (current) use of anticoagulants   . Edema   . Hematoma   . Sinusitis acute   . Adenocarcinoma of breast     right  . Acute upper respiratory infections of unspecified site   .  Depression   . Anal fissure   . GERD (gastroesophageal reflux disease)   . Dog bite(E906.0)   . Diverticulosis of colon (without mention of hemorrhage)   . Spinal stenosis, lumbar region, without neurogenic claudication   . Anxiety   . Panic attacks   . Asthma   . Irritable bowel syndrome   . Hiatal hernia   . Jaundice     Hx of Jaundice at age 14 from "dirty restuarant". Unsure of Hepatitis type  . Neuropathy     bilat LE's  . Chronic back pain   . Lumbar radiculopathy     bilat LE's  . Pain management   . Chronic pain   . Headache     MEDICATIONS AT HOME: Prior to Admission medications   Medication Sig Start Date End Date Taking? Authorizing Provider    acetaminophen (TYLENOL) 500 MG tablet Take 500 mg by mouth every 6 (six) hours as needed for moderate pain.   Yes Historical Provider, MD  albuterol (PROVENTIL HFA;VENTOLIN HFA) 108 (90 BASE) MCG/ACT inhaler Inhale 2 puffs into the lungs every 4 (four) hours as needed for wheezing or shortness of breath. 07/27/14  Yes Noemi Chapel, MD  ALPRAZolam Duanne Moron) 1 MG tablet Take 0.5-1 tablets (0.5-1 mg total) by mouth 2 (two) times daily as needed for anxiety or sleep. 12/08/14  Yes Tresa Garter, MD  ascorbic acid (VITAMIN C) 1000 MG tablet Take 500 mg by mouth daily.    Yes Historical Provider, MD  aspirin EC 81 MG tablet Take 81 mg by mouth daily.   Yes Historical Provider, MD  Calcium Carbonate-Vitamin D (CALCIUM-CARB 600 + D) 600-125 MG-UNIT TABS Take 1 capsule by mouth daily.    Yes Historical Provider, MD  cetirizine (ZYRTEC) 10 MG tablet Take 1 tablet (10 mg total) by mouth daily. 02/05/15  Yes Tresa Garter, MD  cyclobenzaprine (FLEXERIL) 10 MG tablet Take 1 tablet (10 mg total) by mouth 2 (two) times daily. 01/22/15  Yes Tiffany Daneil Dan, PA-C  cycloSPORINE (RESTASIS) 0.05 % ophthalmic emulsion Place 1 drop into both eyes 2 (two) times daily.     Yes Historical Provider, MD  Fluticasone-Salmeterol (ADVAIR) 250-50 MCG/DOSE AEPB Inhale 1 puff into the lungs 2 (two) times daily. Patient taking differently: Inhale 1 puff into the lungs 2 (two) times daily as needed.  03/24/14  Yes Tresa Garter, MD  hydrochlorothiazide (HYDRODIURIL) 12.5 MG tablet Take 1 tablet (12.5 mg total) by mouth daily. 12/08/14  Yes Tresa Garter, MD  metoprolol (LOPRESSOR) 50 MG tablet Take 1 tablet (50 mg total) by mouth daily. 11/03/14  Yes Tresa Garter, MD  oxymetazoline (AFRIN) 0.05 % nasal spray Place 1 spray into both nostrils 2 (two) times daily as needed for congestion.   Yes Historical Provider, MD  pantoprazole (PROTONIX) 40 MG tablet Take 1 tablet (40 mg total) by mouth daily. 01/05/15  Yes  Tresa Garter, MD  polyethylene glycol powder (GLYCOLAX/MIRALAX) powder Take 17gm mixed with water or juice daily 02/16/15  Yes Jerene Bears, MD  pravastatin (PRAVACHOL) 40 MG tablet Take 1 tablet (40 mg total) by mouth every evening. 07/14/14  Yes Tresa Garter, MD  valsartan (DIOVAN) 320 MG tablet Take 1 tablet (320 mg total) by mouth daily. 12/08/14  Yes Tresa Garter, MD  vitamin E (VITAMIN E) 400 UNIT capsule Take 400 Units by mouth daily.   Yes Historical Provider, MD  VOLTAREN 1 % GEL Apply 2 g topically 2 (  two) times daily as needed (pain).  06/30/13  Yes Historical Provider, MD  doxycycline (VIBRA-TABS) 100 MG tablet Take 1 tablet (100 mg total) by mouth 2 (two) times daily. Patient not taking: Reported on 02/19/2015 01/22/15   Brayton Caves, PA-C  gabapentin (NEURONTIN) 100 MG capsule Take 100 mg by mouth 2 (two) times daily.    Historical Provider, MD  HYDROcodone-acetaminophen (NORCO/VICODIN) 5-325 MG per tablet Take 1 tablet by mouth every 6 (six) hours as needed for moderate pain. Patient not taking: Reported on 02/19/2015 02/06/15   Antonietta Breach, PA-C  methocarbamol (ROBAXIN) 500 MG tablet Take 2 tablets (1,000 mg total) by mouth 4 (four) times daily as needed for muscle spasms (muscle spasm/pain). Patient not taking: Reported on 02/19/2015 02/15/15   Francine Graven, DO  oxyCODONE-acetaminophen (PERCOCET/ROXICET) 5-325 MG per tablet 1 or 2 tabs PO q6h prn pain Patient not taking: Reported on 02/19/2015 02/15/15   Francine Graven, DO     Objective:   There were no vitals filed for this visit.  Exam General appearance : Awake, alert, not in any distress. Speech Clear. Not toxic looking HEENT: Atraumatic and Normocephalic, pupils equally reactive to light and accomodation Neck: supple, no JVD. No cervical lymphadenopathy.  Chest:Good air entry bilaterally, no added sounds  CVS: S1 S2 regular, no murmurs.  Abdomen: Bowel sounds present, Non tender and not distended with no  gaurding, rigidity or rebound. Extremities: B/L Lower Ext shows no edema, both legs are warm to touch, positive leg raising sign. Neurology: Awake alert, and oriented X 3, CN II-XII intact, Non focal   Data Review Lab Results  Component Value Date   HGBA1C 5.8* 11/26/2013   HGBA1C 6.2 06/10/2011   HGBA1C * 07/16/2010    5.9 (NOTE)                                                                       According to the ADA Clinical Practice Recommendations for 2011, when HbA1c is used as a screening test:   >=6.5%   Diagnostic of Diabetes Mellitus           (if abnormal result  is confirmed)  5.7-6.4%   Increased risk of developing Diabetes Mellitus  References:Diagnosis and Classification of Diabetes Mellitus,Diabetes ZOXW,9604,54(UJWJX 1):S62-S69 and Standards of Medical Care in         Diabetes - 2011,Diabetes BJYN,8295,62  (Suppl 1):S11-S61.     Assessment & Plan   1. Muscle spasm of back  - MR Lumbar Spine Wo Contrast; Future  2. Midline low back pain without sciatica  - MR Lumbar Spine Wo Contrast; Future  Patient have been counseled extensively about nutrition and exercise Return if symptoms worsen or fail to improve, for Follow up Pain and comorbidities.  The patient was given clear instructions to go to ER or return to medical center if symptoms don't improve, worsen or new problems develop. The patient verbalized understanding. The patient was told to call to get lab results if they haven't heard anything in the next week.   This note has been created with Surveyor, quantity. Any transcriptional errors are unintentional.    Nimrod Wendt, MD, MHA, CPE, [place], [place] and  [place], [place]   02/19/2015, 5:02 PM

## 2015-02-26 ENCOUNTER — Other Ambulatory Visit: Payer: Self-pay | Admitting: Internal Medicine

## 2015-02-26 ENCOUNTER — Telehealth: Payer: Self-pay | Admitting: Internal Medicine

## 2015-02-26 ENCOUNTER — Ambulatory Visit
Admission: RE | Admit: 2015-02-26 | Discharge: 2015-02-26 | Disposition: A | Discharge status: Home / Self Care | Payer: Medicare Other | Source: Ambulatory Visit | Attending: Internal Medicine | Admitting: Internal Medicine

## 2015-02-26 DIAGNOSIS — C50911 Malignant neoplasm of unspecified site of right female breast: Secondary | ICD-10-CM

## 2015-02-26 DIAGNOSIS — Z853 Personal history of malignant neoplasm of breast: Secondary | ICD-10-CM | POA: Diagnosis not present

## 2015-02-26 DIAGNOSIS — R928 Other abnormal and inconclusive findings on diagnostic imaging of breast: Secondary | ICD-10-CM | POA: Diagnosis not present

## 2015-02-26 NOTE — Telephone Encounter (Signed)
Patient called stating that her blood pressure yesterday morning was 177/110, took it again 1 hr. Later with a reading 177/103, and at night was 149/110 with a heart rate was 90. Today she feels that her blood pressure is still high and has throat pain. Please f/u with pt.

## 2015-02-27 ENCOUNTER — Telehealth: Payer: Self-pay

## 2015-02-27 NOTE — Telephone Encounter (Signed)
Nurse called patient, patient verified date of birth. Patient aware of mammogram with no malignancy in either breasts. Patient agrees to a screening mammogram in one year.  Patient reports blood pressure was elevated but is much better today.  Patient agrees to keep diary of blood pressure readings. Patient has MRI on Tuesday. She agrees to report blood pressures to nurse when nurse calls with MRI results next week. Patient aware to go to ED or Urgent Care if blood pressure gets extremely high over the weekend.

## 2015-02-27 NOTE — Telephone Encounter (Signed)
-----   Message from Tresa Garter, MD sent at 02/27/2015  9:24 AM EDT ----- Please inform patient that her mammogram shows no evidence of malignancy in either breasts. Recommend screening mammogram in one year.

## 2015-03-03 ENCOUNTER — Ambulatory Visit (HOSPITAL_COMMUNITY)
Admission: RE | Admit: 2015-03-03 | Discharge: 2015-03-03 | Disposition: A | Discharge status: Home / Self Care | Payer: Medicare Other | Source: Ambulatory Visit | Attending: Internal Medicine | Admitting: Internal Medicine

## 2015-03-03 ENCOUNTER — Telehealth: Payer: Self-pay | Admitting: Internal Medicine

## 2015-03-03 DIAGNOSIS — M5136 Other intervertebral disc degeneration, lumbar region: Secondary | ICD-10-CM | POA: Diagnosis not present

## 2015-03-03 DIAGNOSIS — M545 Low back pain: Secondary | ICD-10-CM | POA: Insufficient documentation

## 2015-03-03 DIAGNOSIS — M5126 Other intervertebral disc displacement, lumbar region: Secondary | ICD-10-CM | POA: Diagnosis not present

## 2015-03-03 DIAGNOSIS — R531 Weakness: Secondary | ICD-10-CM | POA: Diagnosis not present

## 2015-03-03 DIAGNOSIS — M62830 Muscle spasm of back: Secondary | ICD-10-CM | POA: Insufficient documentation

## 2015-03-03 DIAGNOSIS — F331 Major depressive disorder, recurrent, moderate: Secondary | ICD-10-CM | POA: Diagnosis not present

## 2015-03-03 DIAGNOSIS — F411 Generalized anxiety disorder: Secondary | ICD-10-CM | POA: Diagnosis not present

## 2015-03-03 DIAGNOSIS — R200 Anesthesia of skin: Secondary | ICD-10-CM | POA: Diagnosis not present

## 2015-03-03 NOTE — Telephone Encounter (Signed)
Pt would like to speak to provider regarding care.  Pt states she has been in a lot of pain.

## 2015-03-03 NOTE — Telephone Encounter (Signed)
Pt requesting MRI results.  Pt also reports that during visit to psychiatrist pt had a high bp reading, pt tried to explain that it is because she has chronic pain but says that CMA was still surprised and retook patient's bp.

## 2015-03-04 ENCOUNTER — Ambulatory Visit (HOSPITAL_COMMUNITY): Admission: RE | Admit: 2015-03-04 | Payer: Medicare Other | Source: Ambulatory Visit

## 2015-03-04 NOTE — Telephone Encounter (Signed)
Patient is calling to speak to PCP in regards to her pain and MRI Results, please f/u with pt.

## 2015-03-05 NOTE — Telephone Encounter (Signed)
Nurse called patient, patient verified date of birth. Patient had MRI Tuesday March 03, 2015.  Patient requesting MRI results because someone told her "my results would be at Dr. Christa See office by 12pm on Tuesday".   Nurse explained MRI results have to be sent to [facility], Dr. Doreene Burke has to read results. Dr. Doreene Burke will then send them to nurse for nurse to call patient.  Nurse informed patient the earliest patient would get a call from the nurse would be Friday, March 05, 2015.  Patient reports she will be out of town Friday, March 05, 2015-Monday, March 09, 2015.  Patient prefers nurse to return call on Tuesday, March 10, 2015.

## 2015-03-06 ENCOUNTER — Encounter: Payer: Self-pay | Admitting: Internal Medicine

## 2015-03-09 ENCOUNTER — Telehealth: Payer: Self-pay | Admitting: *Deleted

## 2015-03-09 NOTE — Telephone Encounter (Signed)
-----   Message from Tresa Garter, MD sent at 03/06/2015  5:31 PM EDT ----- Please inform patient that her MRI lumbar spine showed mild multilevel degenerative disease/osteoarthritis, no changes from previous MRIs.

## 2015-03-09 NOTE — Telephone Encounter (Signed)
Left HIPAA compliant message for patient to return my call.

## 2015-03-11 ENCOUNTER — Encounter: Payer: Self-pay | Admitting: Gastroenterology

## 2015-03-11 ENCOUNTER — Telehealth: Payer: Self-pay | Admitting: Internal Medicine

## 2015-03-11 NOTE — Telephone Encounter (Signed)
Pt returning nurse's/Dr's call. Please f/u with pt.

## 2015-03-12 ENCOUNTER — Ambulatory Visit: Payer: Medicare Other | Attending: Internal Medicine | Admitting: Internal Medicine

## 2015-03-12 ENCOUNTER — Encounter: Payer: Self-pay | Admitting: Internal Medicine

## 2015-03-12 VITALS — BP 183/99 | HR 72 | Temp 98.0°F | Resp 16 | Wt 172.6 lb

## 2015-03-12 DIAGNOSIS — Z87891 Personal history of nicotine dependence: Secondary | ICD-10-CM | POA: Diagnosis not present

## 2015-03-12 DIAGNOSIS — F329 Major depressive disorder, single episode, unspecified: Secondary | ICD-10-CM

## 2015-03-12 DIAGNOSIS — F322 Major depressive disorder, single episode, severe without psychotic features: Secondary | ICD-10-CM | POA: Insufficient documentation

## 2015-03-12 DIAGNOSIS — M545 Low back pain, unspecified: Secondary | ICD-10-CM

## 2015-03-12 DIAGNOSIS — T7840XD Allergy, unspecified, subsequent encounter: Secondary | ICD-10-CM

## 2015-03-12 DIAGNOSIS — I10 Essential (primary) hypertension: Secondary | ICD-10-CM | POA: Diagnosis not present

## 2015-03-12 MED ORDER — FLUTICASONE-SALMETEROL 250-50 MCG/DOSE IN AEPB: 1.0000 | INHALATION_SPRAY | Freq: Two times a day (BID) | RESPIRATORY_TRACT | Status: DC

## 2015-03-12 MED ORDER — ACETAMINOPHEN-CODEINE #3 300-30 MG PO TABS: 1.0000 | ORAL_TABLET | ORAL | Status: DC | PRN

## 2015-03-12 NOTE — Progress Notes (Signed)
Patient states she is here for the results of her MRI Patient also complains of congestion and sinus pressure under  Her eyes

## 2015-03-12 NOTE — Progress Notes (Signed)
Patient ID: [name], female   DOB: August 31, 1951, 64 y.o.   MRN: [id number]   [name], is a 64 y.o. female  [id number]:[id number]  [id number]:[id number]  DOB - 04-05-1951  Chief Complaint  Patient presents with  . Results  . Sinusitis        Subjective:   [name] is a 64 y.o. female here today for a follow up visit.  Patient with multiple chronic medical conditions as listed below including chronic pain syndrome and generalized anxiety disorder came in to clinic today complaining of severe back pain. She had a recent MRI that she would also like to review. MRI showed no new changes from previous mild degenerative disease - Multilevel. Patient is requesting xanax prescription. She wants to be referred to another psychiatrist who will prescribe her Xanax or Klonopin.  She really does not have any new symptoms today. She only complains of the same ongoing back pain she claimed this pain is now affecting her ambulation. Patient has No headache, No chest pain, No abdominal pain - No Nausea, No new weakness tingling or numbness, No Cough - SOB.  No problems updated.  ALLERGIES: Allergies  Allergen Reactions  . Adhesive [Tape] Other (See Comments)    blisters  . Amoxicillin Swelling    Throat Swells  . Azithromycin Swelling    Throat Swelling  . Bromfed Swelling    Throat Swelling  . Cephalexin Swelling    Throat Swelling  . Chlordiazepoxide-Clidinium Swelling    Throat Swelling  . Clotrimazole Other (See Comments) and Hypertension    Patient told me that she couldn't swallow due to the medication  . Dicyclomine Hcl Hives  . Effexor [Venlafaxine] Hives and Itching  . Gatifloxacin     Other reaction(s): Other (See Comments)  . Hydralazine Hcl     Rash and itching  . Ibuprofen     N/V  . Iohexol      Code: HIVES, Desc: throat swelling no hives 20 yrs ago;needs pre-medication  09/19/07 sg, Onset Date: [id number]   . Latex Swelling    Blisters on Skin  . Lidocaine Hives  .  Lisinopril     Other reaction(s): Angioedema (ALLERGY/intolerance)  . Librax  [Chlordiazepoxide-Clidinium]     Other reaction(s): Other (See Comments)  . Paroxetine Swelling    Throat Swelling  . Penicillins Hives  . Prednisone Swelling    Throat swelling  . Pregabalin     Other reaction(s): Other (See Comments) nervousness  . Propoxyphene N-Acetaminophen Swelling    REACTION: swelling in the throat  . Sertraline     Other reaction(s): Other (See Comments)  . Sertraline Hcl Swelling    Throat Swelling  . Sulfa Antibiotics     Other reaction(s): Unknown  . Sulfadiazine Swelling    Throat Swelling  . Tussionex Pennkinetic Er [Hydrocod Polst-Cpm Polst Er] Itching    "Sunburn"  . Verapamil Swelling    Throat Swelling    PAST MEDICAL HISTORY: Past Medical History  Diagnosis Date  . Myocardial infarct 2005  . CAD (coronary artery disease)   . Hypertension   . Long term (current) use of anticoagulants   . Edema   . Hematoma   . Sinusitis acute   . Adenocarcinoma of breast     right  . Acute upper respiratory infections of unspecified site   . Depression   . Anal fissure   . GERD (gastroesophageal reflux disease)   . Dog bite(E906.0)   . Diverticulosis of colon (without  mention of hemorrhage)   . Spinal stenosis, lumbar region, without neurogenic claudication   . Anxiety   . Panic attacks   . Asthma   . Irritable bowel syndrome   . Hiatal hernia   . Jaundice     Hx of Jaundice at age 70 from "dirty restuarant". Unsure of Hepatitis type  . Neuropathy     bilat LE's  . Chronic back pain   . Lumbar radiculopathy     bilat LE's  . Pain management   . Chronic pain   . Headache     MEDICATIONS AT HOME: Prior to Admission medications   Medication Sig Start Date End Date Taking? Authorizing Provider  acetaminophen (TYLENOL) 500 MG tablet Take 500 mg by mouth every 6 (six) hours as needed for moderate pain.    Historical Provider, MD  acetaminophen-codeine (TYLENOL  #3) 300-30 MG per tablet Take 1 tablet by mouth every 4 (four) hours as needed. 03/12/15   Tresa Garter, MD  albuterol (PROVENTIL HFA;VENTOLIN HFA) 108 (90 BASE) MCG/ACT inhaler Inhale 2 puffs into the lungs every 4 (four) hours as needed for wheezing or shortness of breath. 07/27/14   Noemi Chapel, MD  ALPRAZolam Duanne Moron) 1 MG tablet Take 0.5-1 tablets (0.5-1 mg total) by mouth 2 (two) times daily as needed for anxiety or sleep. 12/08/14   Tresa Garter, MD  ascorbic acid (VITAMIN C) 1000 MG tablet Take 500 mg by mouth daily.     Historical Provider, MD  aspirin EC 81 MG tablet Take 81 mg by mouth daily.    Historical Provider, MD  Calcium Carbonate-Vitamin D (CALCIUM-CARB 600 + D) 600-125 MG-UNIT TABS Take 1 capsule by mouth daily.     Historical Provider, MD  cetirizine (ZYRTEC) 10 MG tablet Take 1 tablet (10 mg total) by mouth daily. 02/05/15   Tresa Garter, MD  cyclobenzaprine (FLEXERIL) 10 MG tablet Take 1 tablet (10 mg total) by mouth 2 (two) times daily. 01/22/15   Tiffany Daneil Dan, PA-C  cycloSPORINE (RESTASIS) 0.05 % ophthalmic emulsion Place 1 drop into both eyes 2 (two) times daily.      Historical Provider, MD  doxycycline (VIBRA-TABS) 100 MG tablet Take 1 tablet (100 mg total) by mouth 2 (two) times daily. Patient not taking: Reported on 02/19/2015 01/22/15   Brayton Caves, PA-C  Fluticasone-Salmeterol (ADVAIR) 250-50 MCG/DOSE AEPB Inhale 1 puff into the lungs 2 (two) times daily. 03/12/15   Tresa Garter, MD  gabapentin (NEURONTIN) 100 MG capsule Take 100 mg by mouth 2 (two) times daily.    Historical Provider, MD  hydrochlorothiazide (HYDRODIURIL) 12.5 MG tablet Take 1 tablet (12.5 mg total) by mouth daily. 12/08/14   Tresa Garter, MD  HYDROcodone-acetaminophen (NORCO/VICODIN) 5-325 MG per tablet Take 1 tablet by mouth every 6 (six) hours as needed for moderate pain. Patient not taking: Reported on 02/19/2015 02/06/15   Antonietta Breach, PA-C  methocarbamol (ROBAXIN) 500  MG tablet Take 2 tablets (1,000 mg total) by mouth 4 (four) times daily as needed for muscle spasms (muscle spasm/pain). Patient not taking: Reported on 02/19/2015 02/15/15   Francine Graven, DO  metoprolol (LOPRESSOR) 50 MG tablet Take 1 tablet (50 mg total) by mouth daily. 11/03/14   Tresa Garter, MD  oxyCODONE-acetaminophen (PERCOCET/ROXICET) 5-325 MG per tablet 1 or 2 tabs PO q6h prn pain Patient not taking: Reported on 02/19/2015 02/15/15   Francine Graven, DO  oxymetazoline (AFRIN) 0.05 % nasal spray Place 1 spray into both  nostrils 2 (two) times daily as needed for congestion.    Historical Provider, MD  pantoprazole (PROTONIX) 40 MG tablet Take 1 tablet (40 mg total) by mouth daily. 01/05/15   Tresa Garter, MD  polyethylene glycol powder (GLYCOLAX/MIRALAX) powder Take 17gm mixed with water or juice daily 02/16/15   Jerene Bears, MD  pravastatin (PRAVACHOL) 40 MG tablet Take 1 tablet (40 mg total) by mouth every evening. 07/14/14   Tresa Garter, MD  valsartan (DIOVAN) 320 MG tablet Take 1 tablet (320 mg total) by mouth daily. 12/08/14   Tresa Garter, MD  vitamin E (VITAMIN E) 400 UNIT capsule Take 400 Units by mouth daily.    Historical Provider, MD  VOLTAREN 1 % GEL Apply 2 g topically 2 (two) times daily as needed (pain).  06/30/13   Historical Provider, MD     Objective:   Filed Vitals:   03/12/15 1535  BP: 180/107  Pulse: 70  Temp: 98 F (36.7 C)  Resp: 16  Weight: 172 lb 9.6 oz (78.291 kg)  SpO2: 100%    Exam General appearance : Awake, alert, not in any distress. Speech Clear. Not toxic looking HEENT: Atraumatic and Normocephalic, pupils equally reactive to light and accomodation Neck: supple, no JVD. No cervical lymphadenopathy.  Chest:Good air entry bilaterally, no added sounds  CVS: S1 S2 regular, no murmurs.  Abdomen: Bowel sounds present, Non tender and not distended with no gaurding, rigidity or rebound. Extremities: B/L Lower Ext shows no edema,  both legs are warm to touch Neurology: Awake alert, and oriented X 3, CN II-XII intact, Non focal Skin:No Rash  Data Review Lab Results  Component Value Date   HGBA1C 5.8* 11/26/2013   HGBA1C 6.2 06/10/2011   HGBA1C * 07/16/2010    5.9 (NOTE)                                                                       According to the ADA Clinical Practice Recommendations for 2011, when HbA1c is used as a screening test:   >=6.5%   Diagnostic of Diabetes Mellitus           (if abnormal result  is confirmed)  5.7-6.4%   Increased risk of developing Diabetes Mellitus  References:Diagnosis and Classification of Diabetes Mellitus,Diabetes HUTM,5465,03(TWSFK 1):S62-S69 and Standards of Medical Care in         Diabetes - 2011,Diabetes Care,2011,34  (Suppl 1):S11-S61.     Assessment & Plan   1. Major depression, chronic  - Ambulatory referral to Psychiatry  2. Essential hypertension:  Uncontrolled most likely from extreme anxiety and pain  - - We have discussed target BP range and blood pressure goal - I have advised patient to check BP regularly and to call us back or report to clinic if the numbers are consistently higher than 140/90  - We discussed the importance of compliance with medical therapy and DASH diet recommended, consequences of uncontrolled hypertension discussed.  - continue current BP medications  3. Midline low back pain without sciatica  - Ambulatory referral to Psychiatry - acetaminophen-codeine (TYLENOL #3) 300-30 MG per tablet; Take 1 tablet by mouth every 4 (four) hours as needed.  Dispense: 60 tablet; Refill: 0 -  Patient  declined a referral to neurosurgery , saying she will see with the psychiatrist need to offer because she had researched this particular psychiatrist and found out that he  Manages chronic pain.  4. Allergy, subsequent encounter  - Fluticasone-Salmeterol (ADVAIR) 250-50 MCG/DOSE AEPB; Inhale 1 puff into the lungs 2 (two) times daily.  Dispense: 60  each; Refill: 3  Patient have been counseled extensively about nutrition and exercise Return if symptoms worsen or fail to improve, for Follow up Pain and comorbidities, Follow up HTN.  The patient was given clear instructions to go to ER or return to medical center if symptoms don't improve, worsen or new problems develop. The patient verbalized understanding. The patient was told to call to get lab results if they haven't heard anything in the next week.   This note has been created with Surveyor, quantity. Any transcriptional errors are unintentional.    Angelica Chessman, MD, [place], [place], [place], [place] and [place], [place]   03/12/2015, 4:55 PM

## 2015-03-12 NOTE — Patient Instructions (Signed)
DASH Eating Plan DASH stands for "Dietary Approaches to Stop Hypertension." The DASH eating plan is a healthy eating plan that has been shown to reduce high blood pressure (hypertension). Additional health benefits may include reducing the risk of type 2 diabetes mellitus, heart disease, and stroke. The DASH eating plan may also help with weight loss. WHAT DO I NEED TO KNOW ABOUT THE DASH EATING PLAN? For the DASH eating plan, you will follow these general guidelines:  Choose foods with a percent daily value for sodium of less than 5% (as listed on the food label).  Use salt-free seasonings or herbs instead of table salt or sea salt.  Check with your health care provider or pharmacist before using salt substitutes.  Eat lower-sodium products, often labeled as "lower sodium" or "no salt added."  Eat fresh foods.  Eat more vegetables, fruits, and low-fat dairy products.  Choose whole grains. Look for the word "whole" as the first word in the ingredient list.  Choose fish and skinless chicken or turkey more often than red meat. Limit fish, poultry, and meat to 6 oz (170 g) each day.  Limit sweets, desserts, sugars, and sugary drinks.  Choose heart-healthy fats.  Limit cheese to 1 oz (28 g) per day.  Eat more home-cooked food and less restaurant, buffet, and fast food.  Limit fried foods.  Bantz foods using methods other than frying.  Limit canned vegetables. If you do use them, rinse them well to decrease the sodium.  When eating at a restaurant, ask that your food be prepared with less salt, or no salt if possible. WHAT FOODS CAN I EAT? Seek help from a dietitian for individual calorie needs. Grains Whole grain or whole wheat bread. Brown rice. Whole grain or whole wheat pasta. Quinoa, bulgur, and whole grain cereals. Low-sodium cereals. Corn or whole wheat flour tortillas. Whole grain cornbread. Whole grain crackers. Low-sodium crackers. Vegetables Fresh or frozen vegetables  (raw, steamed, roasted, or grilled). Low-sodium or reduced-sodium tomato and vegetable juices. Low-sodium or reduced-sodium tomato sauce and paste. Low-sodium or reduced-sodium canned vegetables.  Fruits All fresh, canned (in natural juice), or frozen fruits. Meat and Other Protein Products Ground beef (85% or leaner), grass-fed beef, or beef trimmed of fat. Skinless chicken or turkey. Ground chicken or turkey. Pork trimmed of fat. All fish and seafood. Eggs. Dried beans, peas, or lentils. Unsalted nuts and seeds. Unsalted canned beans. Dairy Low-fat dairy products, such as skim or 1% milk, 2% or reduced-fat cheeses, low-fat ricotta or cottage cheese, or plain low-fat yogurt. Low-sodium or reduced-sodium cheeses. Fats and Oils Tub margarines without trans fats. Light or reduced-fat mayonnaise and salad dressings (reduced sodium). Avocado. Safflower, olive, or canola oils. Natural peanut or almond butter. Other Unsalted popcorn and pretzels. The items listed above may not be a complete list of recommended foods or beverages. Contact your dietitian for more options. WHAT FOODS ARE NOT RECOMMENDED? Grains White bread. White pasta. White rice. Refined cornbread. Bagels and croissants. Crackers that contain trans fat. Vegetables Creamed or fried vegetables. Vegetables in a cheese sauce. Regular canned vegetables. Regular canned tomato sauce and paste. Regular tomato and vegetable juices. Fruits Dried fruits. Canned fruit in light or heavy syrup. Fruit juice. Meat and Other Protein Products Fatty cuts of meat. Ribs, chicken wings, bacon, sausage, bologna, salami, chitterlings, fatback, hot dogs, bratwurst, and packaged luncheon meats. Salted nuts and seeds. Canned beans with salt. Dairy Whole or 2% milk, cream, half-and-half, and cream cheese. Whole-fat or sweetened yogurt. Full-fat   cheeses or blue cheese. Nondairy creamers and whipped toppings. Processed cheese, cheese spreads, or cheese  curds. Condiments Onion and garlic salt, seasoned salt, table salt, and sea salt. Canned and packaged gravies. Worcestershire sauce. Tartar sauce. Barbecue sauce. Teriyaki sauce. Soy sauce, including reduced sodium. Steak sauce. Fish sauce. Oyster sauce. Cocktail sauce. Horseradish. Ketchup and mustard. Meat flavorings and tenderizers. Bouillon cubes. Hot sauce. Tabasco sauce. Marinades. Taco seasonings. Relishes. Fats and Oils Butter, stick margarine, lard, shortening, ghee, and bacon fat. Coconut, palm kernel, or palm oils. Regular salad dressings. Other Pickles and olives. Salted popcorn and pretzels. The items listed above may not be a complete list of foods and beverages to avoid. Contact your dietitian for more information. WHERE CAN I FIND MORE INFORMATION? National Heart, Lung, and Blood Institute: www.nhlbi.nih.gov/health/health-topics/topics/dash/ Document Released: 08/18/2011 Document Revised: 01/13/2014 Document Reviewed: 07/03/2013 ExitCare Patient Information 2015 [employer]. This information is not intended to replace advice given to you by your health care provider. Make sure you discuss any questions you have with your health care provider. Hypertension Hypertension, commonly called high blood pressure, is when the force of blood pumping through your arteries is too strong. Your arteries are the blood vessels that carry blood from your heart throughout your body. A blood pressure reading consists of a higher number over a lower number, such as 110/72. The higher number (systolic) is the pressure inside your arteries when your heart pumps. The lower number (diastolic) is the pressure inside your arteries when your heart relaxes. Ideally you want your blood pressure below 120/80. Hypertension forces your heart to work harder to pump blood. Your arteries may become narrow or stiff. Having hypertension puts you at risk for heart disease, stroke, and other problems.  RISK  FACTORS Some risk factors for high blood pressure are controllable. Others are not.  Risk factors you cannot control include:   Race. You may be at higher risk if you are African American.  Age. Risk increases with age.  Gender. Men are at higher risk than women before age 45 years. After age 65, women are at higher risk than men. Risk factors you can control include:  Not getting enough exercise or physical activity.  Being overweight.  Getting too much fat, sugar, calories, or salt in your diet.  Drinking too much alcohol. SIGNS AND SYMPTOMS Hypertension does not usually cause signs or symptoms. Extremely high blood pressure (hypertensive crisis) may cause headache, anxiety, shortness of breath, and nosebleed. DIAGNOSIS  To check if you have hypertension, your health care provider will measure your blood pressure while you are seated, with your arm held at the level of your heart. It should be measured at least twice using the same arm. Certain conditions can cause a difference in blood pressure between your right and left arms. A blood pressure reading that is higher than normal on one occasion does not mean that you need treatment. If one blood pressure reading is high, ask your health care provider about having it checked again. TREATMENT  Treating high blood pressure includes making lifestyle changes and possibly taking medicine. Living a healthy lifestyle can help lower high blood pressure. You may need to change some of your habits. Lifestyle changes may include:  Following the DASH diet. This diet is high in fruits, vegetables, and whole grains. It is low in salt, red meat, and added sugars.  Getting at least 2 hours of brisk physical activity every week.  Losing weight if necessary.  Not smoking.  Limiting   alcoholic beverages.  Learning ways to reduce stress. If lifestyle changes are not enough to get your blood pressure under control, your health care provider may  prescribe medicine. You may need to take more than one. Work closely with your health care provider to understand the risks and benefits. HOME CARE INSTRUCTIONS  Have your blood pressure rechecked as directed by your health care provider.   Take medicines only as directed by your health care provider. Follow the directions carefully. Blood pressure medicines must be taken as prescribed. The medicine does not work as well when you skip doses. Skipping doses also puts you at risk for problems.   Do not smoke.   Monitor your blood pressure at home as directed by your health care provider. SEEK MEDICAL CARE IF:   You think you are having a reaction to medicines taken.  You have recurrent headaches or feel dizzy.  You have swelling in your ankles.  You have trouble with your vision. SEEK IMMEDIATE MEDICAL CARE IF:  You develop a severe headache or confusion.  You have unusual weakness, numbness, or feel faint.  You have severe chest or abdominal pain.  You vomit repeatedly.  You have trouble breathing. MAKE SURE YOU:   Understand these instructions.  Will watch your condition.  Will get help right away if you are not doing well or get worse. Document Released: 08/29/2005 Document Revised: 01/13/2014 Document Reviewed: 06/21/2013 ExitCare Patient Information 2015 [employer], [employer]. This information is not intended to replace advice given to you by your health care provider. Make sure you discuss any questions you have with your health care provider.  

## 2015-03-17 ENCOUNTER — Telehealth: Payer: Self-pay | Admitting: Internal Medicine

## 2015-03-18 ENCOUNTER — Telehealth: Payer: Self-pay | Admitting: General Practice

## 2015-03-18 NOTE — Telephone Encounter (Signed)
I spoke to patient and she wants me to call Dr Sherren Mocha [facility] to [facility] her visit  Done. Pt will call back to schedule an appointment . Call Dr Georgia Lopes skin specialist and she has an appointment 7/13 /16 @ 10:15 am patient aware of her appointment

## 2015-03-18 NOTE — Telephone Encounter (Signed)
Patient calling to follow up on 2 referrals. Please contact patient to assist.

## 2015-03-19 ENCOUNTER — Ambulatory Visit (INDEPENDENT_AMBULATORY_CARE_PROVIDER_SITE_OTHER): Payer: Medicare Other | Admitting: Psychiatry

## 2015-03-19 DIAGNOSIS — F329 Major depressive disorder, single episode, unspecified: Secondary | ICD-10-CM | POA: Diagnosis not present

## 2015-03-20 DIAGNOSIS — R102 Pelvic and perineal pain: Secondary | ICD-10-CM | POA: Diagnosis not present

## 2015-03-23 DIAGNOSIS — Z419 Encounter for procedure for purposes other than remedying health state, unspecified: Secondary | ICD-10-CM | POA: Diagnosis not present

## 2015-03-23 DIAGNOSIS — M5417 Radiculopathy, lumbosacral region: Secondary | ICD-10-CM | POA: Diagnosis not present

## 2015-03-24 ENCOUNTER — Telehealth: Payer: Self-pay | Admitting: Clinical

## 2015-03-24 NOTE — Telephone Encounter (Signed)
Pt encouraged to register with [facility], but pt says she is being referred by another psychiatrist by the chronic pain specialists. She is currently low on Xanax and is taking extra strength Tylenol for pain. Pt informed that [facility] has contacted numerous psychiatrists in [place] area, in an attempt to find psychiatrist who will prescribe Xanax for Medicare patients, with no success.

## 2015-03-31 ENCOUNTER — Ambulatory Visit (INDEPENDENT_AMBULATORY_CARE_PROVIDER_SITE_OTHER): Payer: Medicare Other | Admitting: Psychiatry

## 2015-03-31 DIAGNOSIS — F4323 Adjustment disorder with mixed anxiety and depressed mood: Secondary | ICD-10-CM | POA: Diagnosis not present

## 2015-03-31 DIAGNOSIS — Z7189 Other specified counseling: Secondary | ICD-10-CM

## 2015-04-01 ENCOUNTER — Telehealth: Payer: Self-pay | Admitting: Internal Medicine

## 2015-04-01 NOTE — Telephone Encounter (Signed)
Call to patient to confirm appointment for 04/02/15 at 2:45 lmtcb

## 2015-04-02 ENCOUNTER — Encounter: Payer: Self-pay | Admitting: Internal Medicine

## 2015-04-02 ENCOUNTER — Ambulatory Visit (INDEPENDENT_AMBULATORY_CARE_PROVIDER_SITE_OTHER): Payer: Medicare Other | Admitting: Internal Medicine

## 2015-04-02 VITALS — BP 155/83 | HR 92 | Temp 98.0°F | Ht 63.0 in | Wt 170.6 lb

## 2015-04-02 DIAGNOSIS — K921 Melena: Secondary | ICD-10-CM

## 2015-04-02 DIAGNOSIS — I10 Essential (primary) hypertension: Secondary | ICD-10-CM | POA: Diagnosis not present

## 2015-04-02 DIAGNOSIS — F411 Generalized anxiety disorder: Secondary | ICD-10-CM | POA: Diagnosis not present

## 2015-04-02 DIAGNOSIS — Z7982 Long term (current) use of aspirin: Secondary | ICD-10-CM

## 2015-04-02 MED ORDER — ALPRAZOLAM 1 MG PO TABS: 0.5000 mg | ORAL_TABLET | Freq: Two times a day (BID) | ORAL | Status: DC | PRN

## 2015-04-02 MED ORDER — HYDROCHLOROTHIAZIDE 25 MG PO TABS: 25.0000 mg | ORAL_TABLET | Freq: Every day | ORAL | Status: DC

## 2015-04-02 NOTE — Patient Instructions (Signed)
1. I have increased your hydrochlorothiazide from 12.5mg  daily to 25mg  daily.  I sent the new prescription to your pharmacy.  You can take two of your 12.5mg  tablets daily until you run out and then pick up the 25mg  tablets.   2. Please take all medications as prescribed.    3. If you have worsening of your symptoms or new symptoms arise, please call the clinic ([phone]), or go to the ER immediately if symptoms are severe.  Please return the stool card as soon as possible.  Come back to see me in 2 weeks to check your blood pressure.

## 2015-04-02 NOTE — Progress Notes (Signed)
Subjective:    Patient ID: [name], female    DOB: 1951/08/30, 64 y.o.   MRN: [id number]  HPI Comments: [name] is a 64 year old woman with PMH as below here to establish care.  Please see problem based charting for assessment and plan.     Past Medical History  Diagnosis Date  . Myocardial infarct 2005  . CAD (coronary artery disease)   . Hypertension   . Long term (current) use of anticoagulants   . Edema   . Hematoma   . Sinusitis acute   . Adenocarcinoma of breast     right  . Acute upper respiratory infections of unspecified site   . Depression   . Anal fissure   . GERD (gastroesophageal reflux disease)   . Dog bite(E906.0)   . Diverticulosis of colon (without mention of hemorrhage)   . Spinal stenosis, lumbar region, without neurogenic claudication   . Anxiety   . Panic attacks   . Asthma   . Irritable bowel syndrome   . Hiatal hernia   . Jaundice     Hx of Jaundice at age 64 from "dirty restuarant". Unsure of Hepatitis type  . Neuropathy     bilat LE's  . Chronic back pain   . Lumbar radiculopathy     bilat LE's  . Pain management   . Chronic pain   . Headache    Current Outpatient Prescriptions on File Prior to Visit  Medication Sig Dispense Refill  . acetaminophen (TYLENOL) 500 MG tablet Take 500 mg by mouth every 6 (six) hours as needed for moderate pain.    Marland Kitchen acetaminophen-codeine (TYLENOL #3) 300-30 MG per tablet Take 1 tablet by mouth every 4 (four) hours as needed. 60 tablet 0  . albuterol (PROVENTIL HFA;VENTOLIN HFA) 108 (90 BASE) MCG/ACT inhaler Inhale 2 puffs into the lungs every 4 (four) hours as needed for wheezing or shortness of breath. 1 Inhaler 3  . ALPRAZolam (XANAX) 1 MG tablet Take 0.5-1 tablets (0.5-1 mg total) by mouth 2 (two) times daily as needed for anxiety or sleep. 60 tablet 3  . ascorbic acid (VITAMIN C) 1000 MG tablet Take 500 mg by mouth daily.     Marland Kitchen aspirin EC 81 MG tablet Take 81 mg by mouth daily.    . Calcium  Carbonate-Vitamin D (CALCIUM-CARB 600 + D) 600-125 MG-UNIT TABS Take 1 capsule by mouth daily.     . cetirizine (ZYRTEC) 10 MG tablet Take 1 tablet (10 mg total) by mouth daily. 30 tablet 3  . cyclobenzaprine (FLEXERIL) 10 MG tablet Take 1 tablet (10 mg total) by mouth 2 (two) times daily. 30 tablet 0  . cycloSPORINE (RESTASIS) 0.05 % ophthalmic emulsion Place 1 drop into both eyes 2 (two) times daily.      Marland Kitchen doxycycline (VIBRA-TABS) 100 MG tablet Take 1 tablet (100 mg total) by mouth 2 (two) times daily. (Patient not taking: Reported on 02/19/2015) 20 tablet 0  . Fluticasone-Salmeterol (ADVAIR) 250-50 MCG/DOSE AEPB Inhale 1 puff into the lungs 2 (two) times daily. 60 each 3  . gabapentin (NEURONTIN) 100 MG capsule Take 100 mg by mouth 2 (two) times daily.    . hydrochlorothiazide (HYDRODIURIL) 12.5 MG tablet Take 1 tablet (12.5 mg total) by mouth daily. 90 tablet 3  . HYDROcodone-acetaminophen (NORCO/VICODIN) 5-325 MG per tablet Take 1 tablet by mouth every 6 (six) hours as needed for moderate pain. (Patient not taking: Reported on 02/19/2015) 6 tablet 0  .  methocarbamol (ROBAXIN) 500 MG tablet Take 2 tablets (1,000 mg total) by mouth 4 (four) times daily as needed for muscle spasms (muscle spasm/pain). (Patient not taking: Reported on 02/19/2015) 25 tablet 0  . metoprolol (LOPRESSOR) 50 MG tablet Take 1 tablet (50 mg total) by mouth daily. 90 tablet 3  . oxyCODONE-acetaminophen (PERCOCET/ROXICET) 5-325 MG per tablet 1 or 2 tabs PO q6h prn pain (Patient not taking: Reported on 02/19/2015) 20 tablet 0  . oxymetazoline (AFRIN) 0.05 % nasal spray Place 1 spray into both nostrils 2 (two) times daily as needed for congestion.    . pantoprazole (PROTONIX) 40 MG tablet Take 1 tablet (40 mg total) by mouth daily. 90 tablet 3  . polyethylene glycol powder (GLYCOLAX/MIRALAX) powder Take 17gm mixed with water or juice daily 255 g 3  . pravastatin (PRAVACHOL) 40 MG tablet Take 1 tablet (40 mg total) by mouth every  evening. 90 tablet 3  . valsartan (DIOVAN) 320 MG tablet Take 1 tablet (320 mg total) by mouth daily. 90 tablet 3  . vitamin E (VITAMIN E) 400 UNIT capsule Take 400 Units by mouth daily.    . VOLTAREN 1 % GEL Apply 2 g topically 2 (two) times daily as needed (pain).      No current facility-administered medications on file prior to visit.    Review of Systems  Constitutional: Positive for chills. Negative for fever and appetite change.  Respiratory: Positive for cough. Negative for shortness of breath.        Has not had to increase rescue inhaler use.  Cardiovascular: Negative for chest pain.  Gastrointestinal: Positive for blood in stool. Negative for nausea, vomiting, abdominal pain, diarrhea and constipation.       She thinks there may have been undigested tomatoes in the stool; notices bright red specks occasionally.  Genitourinary: Negative for dysuria and hematuria.  Psychiatric/Behavioral: The patient is nervous/anxious.        Filed Vitals:   04/02/15 1549  BP: 155/83  Pulse: 92  Temp: 98 F (36.7 C)  TempSrc: Oral  Height: 5\' 3"  (1.6 m)  Weight: 170 lb 9.6 oz (77.384 kg)  SpO2: 99%     Objective:   Physical Exam  Constitutional: She is oriented to person, place, and time. She appears well-developed. No distress.  HENT:  Head: Normocephalic and atraumatic.  Mouth/Throat: Oropharynx is clear and moist. No oropharyngeal exudate.  Eyes: EOM are normal. Pupils are equal, round, and reactive to light.  Neck: Neck supple.  Cardiovascular: Normal rate, regular rhythm and normal heart sounds.  Exam reveals no gallop and no friction rub.   No murmur heard. Pulmonary/Chest: Effort normal and breath sounds normal. No respiratory distress. She has no wheezes. She has no rales.  Abdominal: Soft. Bowel sounds are normal. She exhibits no distension and no mass. There is no tenderness. There is no rebound and no guarding.  Musculoskeletal: Normal range of motion. She exhibits no  edema or tenderness.  Neurological: She is alert and oriented to person, place, and time. No cranial nerve deficit.  Skin: Skin is warm. She is not diaphoretic.  Psychiatric: She has a normal mood and affect. Her behavior is normal. Judgment and thought content normal.  Vitals reviewed.         Assessment & Plan:  Please see problem based charting for assessment and plan.

## 2015-04-03 DIAGNOSIS — K921 Melena: Secondary | ICD-10-CM | POA: Insufficient documentation

## 2015-04-03 NOTE — Assessment & Plan Note (Signed)
BP Readings from Last 3 Encounters:  04/02/15 155/83  03/12/15 183/99  02/16/15 156/80    Lab Results  Component Value Date   NA 141 12/23/2014   K 4.6 12/23/2014   CREATININE 0.97 12/23/2014    Assessment: Blood pressure control:  uncontrolled Progress toward BP goal:    Comments: She reports elevated values at home with SBP > 150.  She says she is compliant with medications.  Plan: Medications:  continue current medications:  Metoprolol 50mg  daily (?why not BID), valsartan 320mg  daily, INCREASE HCTZ from 12.5mg  daily to 25mg  daily Educational resources provided:   Self management tools provided:   Other plans: RTC in 2 weeks for BP and BMP check.

## 2015-04-03 NOTE — Assessment & Plan Note (Addendum)
Patient reports chronic Xanax use for anxiety for about 10 years.  She says she was told by her most recent PCP at [facility] that they can no longer prescribe Xanax "because 3 patients have died."  Review of [place] controlled substance database reveals that the most recent PCP has been prescribing this med for the patient regularly.  Past 6 month hx shows Xanax rx by same prescriber and filled at same pharmacy.  Last rx 1 month ago.  It would be unlikely that an entire practice is no longer prescribing benzos or controlled substances.  I asked [facility] nurse to call [facility] but she is unable to reach anyone. - rx for Xanax 0.5 - 1 tablet BID prn #60 no refills (same rx as past prescriber) given to patient; she will need to be slowly tapered off to avoid withdrawal - she was advised that she needs to follow-up with psych and start a more appropriate long term med like SSRI; she tells me she has psych appointment next month - will send message to prior PCP to clarify if/why his office is no longer prescribing her benzos

## 2015-04-03 NOTE — Assessment & Plan Note (Signed)
Unclear if she is actually seeing blood in her stool.  She tells me she thought there may be undigested pieces of tomato.  Last colonoscopy was 2013 and revealed diverticulosis but otherwise normal.   - stool cards provided and she will return them asap

## 2015-04-04 NOTE — Progress Notes (Signed)
Medicine attending: Medical history, presenting problems, physical findings, and medications, reviewed with Dr Osa Craver and I concur with her evaluation and management plan.

## 2015-04-06 DIAGNOSIS — R102 Pelvic and perineal pain: Secondary | ICD-10-CM | POA: Diagnosis not present

## 2015-04-07 ENCOUNTER — Ambulatory Visit (INDEPENDENT_AMBULATORY_CARE_PROVIDER_SITE_OTHER): Payer: Medicare Other | Admitting: Psychiatry

## 2015-04-07 DIAGNOSIS — F4323 Adjustment disorder with mixed anxiety and depressed mood: Secondary | ICD-10-CM

## 2015-04-07 DIAGNOSIS — Z7189 Other specified counseling: Secondary | ICD-10-CM | POA: Diagnosis not present

## 2015-04-08 ENCOUNTER — Telehealth: Payer: Self-pay | Admitting: *Deleted

## 2015-04-08 NOTE — Telephone Encounter (Signed)
Pt called - Pt increase HCTZ as instructed 04/03/15 and 04/04/15 to HCTZ 25 mg daily - noted increase dizziness, bruising and off balance. Pt stopped med 04/05/15 and now back on HCTZ 12.5mg  daily and feeling better. Dr Redmond Pulling aware and continue with HCTZ 12.5mg  and keep appt for reck 04/15/15. Pt aware. Hilda Blades Breindy Meadow RN 03/29/15 10:30AM

## 2015-04-09 DIAGNOSIS — F609 Personality disorder, unspecified: Secondary | ICD-10-CM | POA: Diagnosis not present

## 2015-04-09 DIAGNOSIS — F4541 Pain disorder exclusively related to psychological factors: Secondary | ICD-10-CM | POA: Diagnosis not present

## 2015-04-09 DIAGNOSIS — F411 Generalized anxiety disorder: Secondary | ICD-10-CM | POA: Diagnosis not present

## 2015-04-09 DIAGNOSIS — M5417 Radiculopathy, lumbosacral region: Secondary | ICD-10-CM | POA: Diagnosis not present

## 2015-04-15 ENCOUNTER — Encounter: Payer: Self-pay | Admitting: Internal Medicine

## 2015-04-15 ENCOUNTER — Ambulatory Visit (INDEPENDENT_AMBULATORY_CARE_PROVIDER_SITE_OTHER): Payer: Medicare Other | Admitting: Internal Medicine

## 2015-04-15 VITALS — BP 136/70 | HR 75 | Temp 98.0°F | Ht 63.0 in | Wt 170.1 lb

## 2015-04-15 DIAGNOSIS — I10 Essential (primary) hypertension: Secondary | ICD-10-CM

## 2015-04-15 DIAGNOSIS — F419 Anxiety disorder, unspecified: Secondary | ICD-10-CM | POA: Diagnosis not present

## 2015-04-15 DIAGNOSIS — Z1211 Encounter for screening for malignant neoplasm of colon: Secondary | ICD-10-CM | POA: Diagnosis not present

## 2015-04-15 DIAGNOSIS — F411 Generalized anxiety disorder: Secondary | ICD-10-CM

## 2015-04-15 LAB — POC HEMOCCULT BLD/STL (HOME/3-CARD/SCREEN)
Card #2 Fecal Occult Blod, POC: NEGATIVE
Card #3 Fecal Occult Blood, POC: NEGATIVE
FECAL OCCULT BLD: NEGATIVE

## 2015-04-15 NOTE — Assessment & Plan Note (Addendum)
BP Readings from Last 3 Encounters:  04/15/15 136/70  04/02/15 155/83  03/12/15 183/99    Lab Results  Component Value Date   NA 141 12/23/2014   K 4.6 12/23/2014   CREATININE 0.97 12/23/2014    Assessment: Blood pressure control:  well controlled Progress toward BP goal:   at goal Comments: Dizzy with increased HCTZ.  So went back down to 12.5mg  daily and symptoms resolved.  Also, on Lopressor and Diovan  Plan: Medications:  continue current medications:  HCTZ 12.5mg  daily, Diovan 320mg  daily, metoprolol 50mg  daily --> 25mg  BID Educational resources provided: brochure (denies) Self management tools provided: home blood pressure logbook Other plans: RTC in 1-2 months for follow-up

## 2015-04-15 NOTE — Patient Instructions (Signed)
1. Please return to see me in 1 month for follow-up.   2. Please take all medications as prescribed.    3. If you have worsening of your symptoms or new symptoms arise, please call the clinic ([phone]), or go to the ER immediately if symptoms are severe.

## 2015-04-15 NOTE — Progress Notes (Signed)
Subjective:    Patient ID: DESTENIE INGBER, female    DOB: 1951/05/18, 64 y.o.   MRN: [id number]  HPI Comments: [name] is a 64 year old woman here for follow-up of BP.  Please see problem based charting for assessment and plan.    Past Medical History  Diagnosis Date  . Myocardial infarct 2005  . CAD (coronary artery disease)   . Hypertension   . Long term (current) use of anticoagulants   . Edema   . Hematoma   . Sinusitis acute   . Adenocarcinoma of breast     right  . Acute upper respiratory infections of unspecified site   . Depression   . Anal fissure   . GERD (gastroesophageal reflux disease)   . Dog bite(E906.0)   . Diverticulosis of colon (without mention of hemorrhage)   . Spinal stenosis, lumbar region, without neurogenic claudication   . Anxiety   . Panic attacks   . Asthma   . Irritable bowel syndrome   . Hiatal hernia   . Jaundice     Hx of Jaundice at age 89 from "dirty restuarant". Unsure of Hepatitis type  . Neuropathy     bilat LE's  . Chronic back pain   . Lumbar radiculopathy     bilat LE's  . Pain management   . Chronic pain   . Headache    Current Outpatient Prescriptions on File Prior to Visit  Medication Sig Dispense Refill  . acetaminophen (TYLENOL) 500 MG tablet Take 500 mg by mouth every 6 (six) hours as needed for moderate pain.    Marland Kitchen acetaminophen-codeine (TYLENOL #3) 300-30 MG per tablet Take 1 tablet by mouth every 4 (four) hours as needed. 60 tablet 0  . albuterol (PROVENTIL HFA;VENTOLIN HFA) 108 (90 BASE) MCG/ACT inhaler Inhale 2 puffs into the lungs every 4 (four) hours as needed for wheezing or shortness of breath. 1 Inhaler 3  . ALPRAZolam (XANAX) 1 MG tablet Take 0.5-1 tablets (0.5-1 mg total) by mouth 2 (two) times daily as needed for anxiety. 60 tablet 0  . ascorbic acid (VITAMIN C) 1000 MG tablet Take 500 mg by mouth daily.     Marland Kitchen aspirin EC 81 MG tablet Take 81 mg by mouth daily.    . Calcium Carbonate-Vitamin D  (CALCIUM-CARB 600 + D) 600-125 MG-UNIT TABS Take 1 capsule by mouth daily.     . cetirizine (ZYRTEC) 10 MG tablet Take 1 tablet (10 mg total) by mouth daily. 30 tablet 3  . cyclobenzaprine (FLEXERIL) 10 MG tablet Take 1 tablet (10 mg total) by mouth 2 (two) times daily. 30 tablet 0  . cycloSPORINE (RESTASIS) 0.05 % ophthalmic emulsion Place 1 drop into both eyes 2 (two) times daily.      . Fluticasone-Salmeterol (ADVAIR) 250-50 MCG/DOSE AEPB Inhale 1 puff into the lungs 2 (two) times daily. 60 each 3  . gabapentin (NEURONTIN) 100 MG capsule Take 100 mg by mouth 2 (two) times daily.    . hydrochlorothiazide (HYDRODIURIL) 25 MG tablet Take 1 tablet (25 mg total) by mouth daily. 30 tablet 1  . methocarbamol (ROBAXIN) 500 MG tablet Take 2 tablets (1,000 mg total) by mouth 4 (four) times daily as needed for muscle spasms (muscle spasm/pain). (Patient not taking: Reported on 02/19/2015) 25 tablet 0  . metoprolol (LOPRESSOR) 50 MG tablet Take 1 tablet (50 mg total) by mouth daily. 90 tablet 3  . oxymetazoline (AFRIN) 0.05 % nasal spray Place 1 spray into both  nostrils 2 (two) times daily as needed for congestion.    . pantoprazole (PROTONIX) 40 MG tablet Take 1 tablet (40 mg total) by mouth daily. 90 tablet 3  . polyethylene glycol powder (GLYCOLAX/MIRALAX) powder Take 17gm mixed with water or juice daily 255 g 3  . pravastatin (PRAVACHOL) 40 MG tablet Take 1 tablet (40 mg total) by mouth every evening. 90 tablet 3  . valsartan (DIOVAN) 320 MG tablet Take 1 tablet (320 mg total) by mouth daily. 90 tablet 3  . vitamin E (VITAMIN E) 400 UNIT capsule Take 400 Units by mouth daily.    . VOLTAREN 1 % GEL Apply 2 g topically 2 (two) times daily as needed (pain).      No current facility-administered medications on file prior to visit.    Review of Systems  Constitutional: Negative for appetite change.  Respiratory: Negative for shortness of breath.   Cardiovascular: Negative for chest pain.    Gastrointestinal: Negative for nausea, vomiting, diarrhea and constipation.  Genitourinary: Negative for dysuria and difficulty urinating.  Neurological: Negative for light-headedness.       Filed Vitals:   04/15/15 1426  BP: 136/70  Pulse: 75  Temp: 98 F (36.7 C)  TempSrc: Oral  Height: 5\' 3"  (1.6 m)  Weight: 170 lb 1.6 oz (77.157 kg)  SpO2: 100%    Objective:   Physical Exam  Constitutional: She is oriented to person, place, and time. She appears well-developed. No distress.  HENT:  Head: Normocephalic and atraumatic.  Mouth/Throat: Oropharynx is clear and moist. No oropharyngeal exudate.  Eyes: EOM are normal. Pupils are equal, round, and reactive to light.  Neck: Neck supple.  Cardiovascular: Normal rate, regular rhythm and normal heart sounds.  Exam reveals no gallop and no friction rub.   No murmur heard. Pulmonary/Chest: Effort normal and breath sounds normal. No respiratory distress. She has no wheezes. She has no rales.  Abdominal: Soft. Bowel sounds are normal. She exhibits no distension. There is no tenderness. There is no rebound and no guarding.  Musculoskeletal: Normal range of motion. She exhibits no edema or tenderness.  Neurological: She is alert and oriented to person, place, and time. No cranial nerve deficit.  Skin: Skin is warm. She is not diaphoretic.  Psychiatric: She has a normal mood and affect. Her behavior is normal. Judgment and thought content normal.  Vitals reviewed.         Assessment & Plan:  Please see problem based charting for assessment and plan.

## 2015-04-16 MED ORDER — METOPROLOL TARTRATE 25 MG PO TABS: 25.0000 mg | ORAL_TABLET | Freq: Two times a day (BID) | ORAL | Status: DC

## 2015-04-16 MED ORDER — HYDROCHLOROTHIAZIDE 12.5 MG PO TABS: 12.5000 mg | ORAL_TABLET | Freq: Every day | ORAL | Status: DC

## 2015-04-16 NOTE — Assessment & Plan Note (Addendum)
She is scheduled to see psych at the end of this month.  Will plan to initiate SSRI if psych has not already done so when I see her next visit.  Can begin to taper xanax once she is on appropriate long-term med. - follow-up in 1 month

## 2015-04-23 ENCOUNTER — Ambulatory Visit (INDEPENDENT_AMBULATORY_CARE_PROVIDER_SITE_OTHER): Payer: Medicare Other | Admitting: Cardiology

## 2015-04-23 ENCOUNTER — Encounter: Payer: Self-pay | Admitting: Cardiology

## 2015-04-23 VITALS — BP 130/78 | HR 80 | Ht 63.0 in | Wt 167.8 lb

## 2015-04-23 DIAGNOSIS — I2510 Atherosclerotic heart disease of native coronary artery without angina pectoris: Secondary | ICD-10-CM

## 2015-04-23 DIAGNOSIS — I10 Essential (primary) hypertension: Secondary | ICD-10-CM

## 2015-04-23 NOTE — Progress Notes (Signed)
Cardiology Office Note   Date:  04/23/2015   ID:  [name], DOB 1950/11/12, MRN [id number]  PCP:  Duwaine Maxin, DO  Cardiologist:   Minus Breeding, MD   Chief Complaint  Patient presents with  . Chest Pain  . Edema      History of Present Illness: [name] is a 64 y.o. female who presents for evaluation with a history of coronary artery disease. I extensively reviewed her records. I see a catheterization from 2003 and another from 2005. The patient reports that these are the only invasive study she's ever had. However, previous records mentioning a stent in 2001. I see no record of the stent. The patient denies ever being cath in 2001. Her most recent cath in 2005 reports only stable nonobstructive disease with a 40% LAD lesion and a 25% circumflex lesion. She had an echocardiogram in 2011 which was unremarkable. She's recently been seeing her primary care doctor for management of hypertension which now is well controlled.  She was apparently referred back here by primary care although I don't see mention of that in most recent note. The patient has not been having any specific cardiac complaints. She gets in her full. She walks her dog a couple of times per day. She actually denies any chest pressure, neck or arm discomfort. He is not having any palpitations, presyncope or syncope. She's not been having any shortness of breath, PND or orthopnea. She denies any weight gain or edema.  Past Medical History  Diagnosis Date  . Myocardial infarct 2005  . CAD (coronary artery disease)   . Hypertension   . Long term (current) use of anticoagulants   . Edema   . Hematoma   . Sinusitis acute   . Adenocarcinoma of breast     right  . Acute upper respiratory infections of unspecified site   . Depression   . Anal fissure   . GERD (gastroesophageal reflux disease)   . Dog bite(E906.0)   . Diverticulosis of colon (without mention of hemorrhage)   . Spinal stenosis, lumbar  region, without neurogenic claudication   . Anxiety   . Panic attacks   . Asthma   . Irritable bowel syndrome   . Hiatal hernia   . Jaundice     Hx of Jaundice at age 59 from "dirty restuarant". Unsure of Hepatitis type  . Neuropathy     bilat LE's  . Chronic back pain   . Lumbar radiculopathy     bilat LE's  . Pain management   . Chronic pain   . Headache     Past Surgical History  Procedure Laterality Date  . Partial hysterectomy  1987  . Cardiac catheterization  2001  . Coronary angioplasty with stent placement  2001  . Cholecystectomy    . Bladder repair      tact  . Breast lumpectomy Right   . Breast reconstruction Right   . Breast reduction surgery Left   . Colonoscopy  2013    Diverticulosis  . Cataract extraction    . Esophageal manometry  10/08/2012    Procedure: ESOPHAGEAL MANOMETRY (EM);  Surgeon: Sable Feil, MD;  Location: [facility];  Service: Endoscopy;  Laterality: N/A;  . Esophagogastroduodenoscopy  2014    Normal      Current Outpatient Prescriptions  Medication Sig Dispense Refill  . acetaminophen (TYLENOL) 500 MG tablet Take 500 mg by mouth every 6 (six) hours as needed for moderate pain.    Marland Kitchen  albuterol (PROVENTIL HFA;VENTOLIN HFA) 108 (90 BASE) MCG/ACT inhaler Inhale 2 puffs into the lungs every 4 (four) hours as needed for wheezing or shortness of breath. 1 Inhaler 3  . ALPRAZolam (XANAX) 1 MG tablet Take 0.5-1 tablets (0.5-1 mg total) by mouth 2 (two) times daily as needed for anxiety. 60 tablet 0  . ascorbic acid (VITAMIN C) 1000 MG tablet Take 500 mg by mouth daily.     Marland Kitchen aspirin EC 81 MG tablet Take 81 mg by mouth daily.    . Calcium Carbonate-Vitamin D (CALCIUM-CARB 600 + D) 600-125 MG-UNIT TABS Take 1 capsule by mouth daily.     . cetirizine (ZYRTEC) 10 MG tablet Take 1 tablet (10 mg total) by mouth daily. 30 tablet 3  . cycloSPORINE (RESTASIS) 0.05 % ophthalmic emulsion Place 1 drop into both eyes 2 (two) times daily.      .  Fluticasone-Salmeterol (ADVAIR) 250-50 MCG/DOSE AEPB Inhale 1 puff into the lungs 2 (two) times daily. 60 each 3  . hydrochlorothiazide (HYDRODIURIL) 12.5 MG tablet Take 1 tablet (12.5 mg total) by mouth daily. 90 tablet 1  . metoprolol (LOPRESSOR) 25 MG tablet Take 1 tablet (25 mg total) by mouth 2 (two) times daily. 60 tablet 1  . oxymetazoline (AFRIN) 0.05 % nasal spray Place 1 spray into both nostrils 2 (two) times daily as needed for congestion.    . pantoprazole (PROTONIX) 40 MG tablet Take 1 tablet (40 mg total) by mouth daily. 90 tablet 3  . polyethylene glycol powder (GLYCOLAX/MIRALAX) powder Take 17gm mixed with water or juice daily 255 g 3  . pravastatin (PRAVACHOL) 40 MG tablet Take 1 tablet (40 mg total) by mouth every evening. 90 tablet 3  . valsartan (DIOVAN) 320 MG tablet Take 1 tablet (320 mg total) by mouth daily. 90 tablet 3  . vitamin E (VITAMIN E) 400 UNIT capsule Take 400 Units by mouth daily.    . VOLTAREN 1 % GEL Apply 2 g topically 2 (two) times daily as needed (pain).      No current facility-administered medications for this visit.    Allergies:   Codeine; Adhesive; Amoxicillin; Azithromycin; Bromfed; Cephalexin; Chlordiazepoxide-clidinium; Clotrimazole; Dicyclomine hcl; Effexor; Gatifloxacin; Hydralazine hcl; Ibuprofen; Iohexol; Latex; Lidocaine; Lisinopril; Librax ; Paroxetine; Penicillins; Prednisone; Pregabalin; Propoxyphene n-acetaminophen; Sertraline; Sertraline hcl; Sulfa antibiotics; Sulfadiazine; Tussionex pennkinetic er; and Verapamil    Social History:  The patient  reports that she quit smoking about 31 years ago. Her smoking use included Cigarettes. She has a 2.4 pack-year smoking history. She has never used smokeless tobacco. She reports that she does not drink alcohol or use illicit drugs.   Family History:  The patient's family history includes Arthritis in her mother; Colon cancer in her cousin; Colon polyps in her father and mother; Dementia in her  mother; Diabetes in her mother; Heart disease in her mother and sister; Hypertension in her brother, father, mother, and sister; Inflammatory bowel disease in her sister; Lung cancer in her maternal uncle; Prostate cancer in her father. There is no history of Rectal cancer or Stomach cancer.    ROS:  Please see the history of present illness.   Otherwise, review of systems are positive for none.   All other systems are reviewed and negative.    PHYSICAL EXAM: VS:  BP 130/78 mmHg  Pulse 80  Ht 5\' 3"  (1.6 m)  Wt 167 lb 12.8 oz (76.114 kg)  BMI 29.73 kg/m2 , BMI Body mass index is 29.73 kg/(m^2). GENERAL:  Well appearing HEENT:  Pupils equal round and reactive, fundi not visualized, oral mucosa unremarkable NECK:  No jugular venous distention, waveform within normal limits, carotid upstroke brisk and symmetric, no bruits, no thyromegaly LYMPHATICS:  No cervical, inguinal adenopathy LUNGS:  Clear to auscultation bilaterally BACK:  No CVA tenderness CHEST:  Unremarkable HEART:  PMI not displaced or sustained,S1 and S2 within normal limits, no S3, no S4, no clicks, no rubs, no murmurs ABD:  Flat, positive bowel sounds normal in frequency in pitch, no bruits, no rebound, no guarding, no midline pulsatile mass, no hepatomegaly, no splenomegaly EXT:  2 plus pulses throughout, no edema, no cyanosis no clubbing SKIN:  No rashes no nodules NEURO:  Cranial nerves II through XII grossly intact, motor grossly intact throughout PSYCH:  Cognitively intact, oriented to person place and time    EKG:  EKG is not ordered today. The ekg ordered 3/6/16demonstrates Sinus tach rate 126, poor anterior R wave progression, no acute ST-T wave changes.   Recent Labs: 08/02/2014: Pro B Natriuretic peptide (BNP) 93.2 12/23/2014: ALT 18; BUN 18; Creat 0.97; Hemoglobin 13.8; Platelets 209; Potassium 4.6; Sodium 141    Lipid Panel    Component Value Date/Time   CHOL 158 05/12/2014 1207   TRIG 203* 05/12/2014  1207   HDL 45 05/12/2014 1207   CHOLHDL 3.5 05/12/2014 1207   VLDL 41* 05/12/2014 1207   LDLCALC 72 05/12/2014 1207      Wt Readings from Last 3 Encounters:  04/23/15 167 lb 12.8 oz (76.114 kg)  04/15/15 170 lb 1.6 oz (77.157 kg)  04/02/15 170 lb 9.6 oz (77.384 kg)      Other studies Reviewed: Additional studies/ records that were reviewed today include: Extensive review of previous records. Review of the above records demonstrates:  Please see elsewhere in the note.     ASSESSMENT AND PLAN:  CAD:  The patient has nonobstructive disease. I have extensively reviewed the notes and there is no suggestion of previous heart attack or stenting. I am removing this from her history of present illness. She does need primary risk reduction and does have control of her cholesterol and blood pressure. She is limited by back pain. It would be reasonable when she is recovered from this to screen her given her nonobstructive disease. She did have a plain old exercise treadmill testing. However, in the absence of ongoing symptoms other noninvasive testing is not warranted.  ( (Greater than 22minutes reviewing all data with greater than 50% face to face with the patient).  HTN:  The blood pressure is at target. No change in medications is indicated. We will continue with therapeutic lifestyle changes (TLC).  Current medicines are reviewed at length with the patient today.  The patient does not have concerns regarding medicines.  The following changes have been made:  no change  Labs/ tests ordered today include: None  No orders of the defined types were placed in this encounter.     Disposition:   FU with as needed    Signed, Minus Breeding, MD  04/23/2015 2:43 PM    [place]

## 2015-04-23 NOTE — Patient Instructions (Signed)
Your physician recommends that you schedule a follow-up appointment As Needed  

## 2015-04-24 DIAGNOSIS — G894 Chronic pain syndrome: Secondary | ICD-10-CM | POA: Diagnosis not present

## 2015-04-24 DIAGNOSIS — M5417 Radiculopathy, lumbosacral region: Secondary | ICD-10-CM | POA: Diagnosis not present

## 2015-04-27 NOTE — Progress Notes (Signed)
[facility] Medicine Clinic Attending  Case discussed with Dr. Wilson soon after the resident saw the patient.  We reviewed the resident's history and exam and pertinent patient test results.  I agree with the assessment, diagnosis, and plan of care documented in the resident's note.  

## 2015-05-06 ENCOUNTER — Encounter (HOSPITAL_COMMUNITY): Payer: Self-pay | Admitting: Psychiatry

## 2015-05-06 ENCOUNTER — Ambulatory Visit (INDEPENDENT_AMBULATORY_CARE_PROVIDER_SITE_OTHER): Payer: Medicare Other | Admitting: Psychiatry

## 2015-05-06 VITALS — BP 137/84 | HR 88 | Ht 63.0 in | Wt 167.0 lb

## 2015-05-06 DIAGNOSIS — F411 Generalized anxiety disorder: Secondary | ICD-10-CM

## 2015-05-06 DIAGNOSIS — F419 Anxiety disorder, unspecified: Secondary | ICD-10-CM

## 2015-05-06 MED ORDER — ALPRAZOLAM 0.5 MG PO TABS: 0.5000 mg | ORAL_TABLET | Freq: Two times a day (BID) | ORAL | Status: DC | PRN

## 2015-05-06 MED ORDER — AMITRIPTYLINE HCL 10 MG PO TABS: 10.0000 mg | ORAL_TABLET | Freq: Every day | ORAL | Status: DC
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
## 2015-05-07 ENCOUNTER — Ambulatory Visit (INDEPENDENT_AMBULATORY_CARE_PROVIDER_SITE_OTHER): Payer: Medicare Other | Admitting: Psychiatry

## 2015-05-07 DIAGNOSIS — F4323 Adjustment disorder with mixed anxiety and depressed mood: Secondary | ICD-10-CM

## 2015-05-07 DIAGNOSIS — Z7189 Other specified counseling: Secondary | ICD-10-CM | POA: Diagnosis not present

## 2015-05-13 ENCOUNTER — Encounter: Payer: Self-pay | Admitting: *Deleted

## 2015-05-19 ENCOUNTER — Ambulatory Visit (INDEPENDENT_AMBULATORY_CARE_PROVIDER_SITE_OTHER): Payer: Medicare Other | Admitting: Psychiatry

## 2015-05-19 ENCOUNTER — Other Ambulatory Visit: Payer: Self-pay

## 2015-05-19 DIAGNOSIS — D485 Neoplasm of uncertain behavior of skin: Secondary | ICD-10-CM | POA: Diagnosis not present

## 2015-05-19 DIAGNOSIS — Z7189 Other specified counseling: Secondary | ICD-10-CM | POA: Diagnosis not present

## 2015-05-19 DIAGNOSIS — L821 Other seborrheic keratosis: Secondary | ICD-10-CM | POA: Diagnosis not present

## 2015-05-19 DIAGNOSIS — F4323 Adjustment disorder with mixed anxiety and depressed mood: Secondary | ICD-10-CM

## 2015-05-19 DIAGNOSIS — L570 Actinic keratosis: Secondary | ICD-10-CM | POA: Diagnosis not present

## 2015-05-25 ENCOUNTER — Ambulatory Visit (INDEPENDENT_AMBULATORY_CARE_PROVIDER_SITE_OTHER): Payer: Medicare Other | Admitting: Internal Medicine

## 2015-05-25 ENCOUNTER — Encounter: Payer: Self-pay | Admitting: Internal Medicine

## 2015-05-25 VITALS — BP 150/74 | HR 76 | Temp 98.2°F | Ht 63.0 in | Wt 168.3 lb

## 2015-05-25 DIAGNOSIS — J329 Chronic sinusitis, unspecified: Secondary | ICD-10-CM | POA: Insufficient documentation

## 2015-05-25 DIAGNOSIS — J019 Acute sinusitis, unspecified: Secondary | ICD-10-CM | POA: Diagnosis not present

## 2015-05-25 MED ORDER — DOXYCYCLINE MONOHYDRATE 100 MG PO CAPS: 100.0000 mg | ORAL_CAPSULE | Freq: Two times a day (BID) | ORAL | Status: DC

## 2015-05-25 NOTE — Progress Notes (Signed)
   Subjective:    Patient ID: TYRIA SPRINGER, female    DOB: 05-21-1951, 64 y.o.   MRN: [id number]  HPI 64 y/o F w/ PMHx of HTN, asthma, and chronic back pain who presents to clinic for acute sinusitis. Please see problem list for further details.      Review of Systems  Constitutional: Positive for fever and chills.  HENT: Positive for congestion, ear pain, postnasal drip and sinus pressure.   Respiratory: Positive for cough.   Cardiovascular: Positive for leg swelling. Negative for chest pain.  Musculoskeletal: Positive for neck pain.       Objective:   Physical Exam  Constitutional: She appears well-developed and well-nourished. No distress.  HENT:  Right Ear: Tympanic membrane and external ear normal.  Left Ear: Tympanic membrane and external ear normal.  Post nasal drip, tender to palpation of maxillary sinus rt>left  Cardiovascular: Normal rate, regular rhythm and normal heart sounds.   No murmur heard. Pulmonary/Chest: Effort normal and breath sounds normal. No respiratory distress. She has no wheezes.  Abdominal: Soft. Bowel sounds are normal. She exhibits no distension. There is no tenderness.  Musculoskeletal:  Tender to palpation of posterior neck muscles  Neurological: She is alert.  Skin: Skin is warm and dry.          Assessment & Plan:  Please see problem based assessment and plan.

## 2015-05-25 NOTE — Patient Instructions (Addendum)
If your symptoms do not improve within 1 week you can take doxycycline 100mg  twice a day for 1 weeks.   Sinusitis Sinusitis is redness, soreness, and puffiness (inflammation) of the air pockets in the bones of your face (sinuses). The redness, soreness, and puffiness can cause air and mucus to get trapped in your sinuses. This can allow germs to grow and cause an infection.  HOME CARE   Drink enough fluids to keep your pee (urine) clear or pale yellow.  Use a humidifier in your home.  Run a hot shower to create steam in the bathroom. Sit in the bathroom with the door closed. Breathe in the steam 3-4 times a day.  Put a warm, moist washcloth on your face 3-4 times a day, or as told by your doctor.  Use salt water sprays (saline sprays) to wet the thick fluid in your nose. This can help the sinuses drain.  Only take medicine as told by your doctor. GET HELP RIGHT AWAY IF:   Your pain gets worse.  You have very bad headaches.  You are sick to your stomach (nauseous).  You throw up (vomit).  You are very sleepy (drowsy) all the time.  Your face is puffy (swollen).  Your vision changes.  You have a stiff neck.  You have trouble breathing. MAKE SURE YOU:   Understand these instructions.  Will watch your condition.  Will get help right away if you are not doing well or get worse. Document Released: 02/15/2008 Document Revised: 05/23/2012 Document Reviewed: 04/03/2012 [facility] Patient Information 2015 [place], [place]. This information is not intended to replace advice given to you by your health care provider. Make sure you discuss any questions you have with your health care provider.

## 2015-05-25 NOTE — Assessment & Plan Note (Signed)
Pt complaining of sinus pressure x 1 week. She had a fever of 101.28F 3 days ago and noticed chills last night. She has congestion w/ yellow nasal secretions and a productive cough. She has ear pain and states left ear hurts worse than her right ear. She is also having neck pain that started Friday. On exam she it tender to palpation of her maxillary sinus and post nasal drip. Likely pt has a viral sinusitis that will self resolve over the next week.   - rx for doxycycline 100mg  BID x 7 days as she has multiple medicine allergies. Instructed pt not to take doxycycline unless her symptoms do not improve over the next week.

## 2015-05-26 NOTE — Progress Notes (Signed)
Internal Medicine Clinic Attending  Case discussed with Dr. Truong at the time of the visit.  We reviewed the resident's history and exam and pertinent patient test results.  I agree with the assessment, diagnosis, and plan of care documented in the resident's note.  

## 2015-05-27 ENCOUNTER — Encounter: Payer: Self-pay | Admitting: Internal Medicine

## 2015-05-28 ENCOUNTER — Encounter (HOSPITAL_COMMUNITY): Payer: Self-pay | Admitting: Psychiatry

## 2015-05-28 ENCOUNTER — Ambulatory Visit (INDEPENDENT_AMBULATORY_CARE_PROVIDER_SITE_OTHER): Payer: Medicare Other | Admitting: Psychiatry

## 2015-05-28 VITALS — BP 164/92 | HR 86 | Ht 63.0 in | Wt 167.0 lb

## 2015-05-28 DIAGNOSIS — F410 Panic disorder [episodic paroxysmal anxiety] without agoraphobia: Secondary | ICD-10-CM | POA: Diagnosis not present

## 2015-05-28 DIAGNOSIS — F411 Generalized anxiety disorder: Secondary | ICD-10-CM | POA: Diagnosis not present

## 2015-05-28 DIAGNOSIS — F4010 Social phobia, unspecified: Secondary | ICD-10-CM

## 2015-05-28 MED ORDER — ALPRAZOLAM 0.5 MG PO TABS: 0.5000 mg | ORAL_TABLET | Freq: Two times a day (BID) | ORAL | Status: DC | PRN

## 2015-05-28 MED ORDER — AMITRIPTYLINE HCL 25 MG PO TABS: 25.0000 mg | ORAL_TABLET | Freq: Every day | ORAL | Status: DC
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
## 2015-06-02 ENCOUNTER — Ambulatory Visit: Payer: Federal, State, Local not specified - Other | Admitting: Psychiatry

## 2015-06-03 ENCOUNTER — Ambulatory Visit (INDEPENDENT_AMBULATORY_CARE_PROVIDER_SITE_OTHER): Payer: Medicare Other | Admitting: Psychiatry

## 2015-06-03 DIAGNOSIS — F4323 Adjustment disorder with mixed anxiety and depressed mood: Secondary | ICD-10-CM

## 2015-06-03 DIAGNOSIS — Z7189 Other specified counseling: Secondary | ICD-10-CM | POA: Diagnosis not present

## 2015-06-10 ENCOUNTER — Other Ambulatory Visit: Payer: Self-pay | Admitting: *Deleted

## 2015-06-10 DIAGNOSIS — C50211 Malignant neoplasm of upper-inner quadrant of right female breast: Secondary | ICD-10-CM

## 2015-06-10 NOTE — Progress Notes (Signed)
Patient ID: [name], female   DOB: 11-Feb-1951, 64 y.o.   MRN: [id number]   [name], is a 64 y.o. female  GGE:[id number]  YYT:[id number]  DOB - [birth year]  Chief Complaint  Patient presents with  . Follow-up        Subjective:   [name] is a 64 y.o. female here today for a follow up visit. Patient has extensive medical history which includes generalized anxiety and depression, she also has multiple allergies as listed below. She came today for follow-up of depression and anxiety, she would like to be referred to psychiatrist. Patient also having chronic pain in her back and legs requesting pain medication like narcotics all be referred to pain clinic. Patient describes symptoms of reflux with epigastric pain and belching but has no medication for GERD. Patient has No headache, No chest pain,- No Nausea, No new weakness tingling or numbness, No Cough - SOB.  No problems updated.  ALLERGIES: Allergies  Allergen Reactions  . Codeine Rash  . Adhesive [Tape] Other (See Comments)    blisters  . Amoxicillin Swelling    Throat Swells  . Azithromycin Swelling    Throat Swelling  . Bromfed Swelling    Throat Swelling  . Cephalexin Swelling    Throat Swelling  . Chlordiazepoxide-Clidinium Swelling    Throat Swelling  . Clotrimazole Other (See Comments) and Hypertension    Patient told me that she couldn't swallow due to the medication  . Dicyclomine Hcl Hives  . Effexor [Venlafaxine] Hives and Itching  . Gatifloxacin     Other reaction(s): Other (See Comments)  . Hydralazine Hcl     Rash and itching  . Ibuprofen     N/V  . Iohexol      Code: HIVES, Desc: throat swelling no hives 20 yrs ago;needs pre-medication  09/19/07 sg, Onset Date: [id number]   . Latex Swelling    Blisters on Skin  . Lidocaine Hives  . Lisinopril     Other reaction(s): Angioedema (ALLERGY/intolerance)  . Librax  [Chlordiazepoxide-Clidinium]     Other reaction(s): Other (See Comments)  .  Paroxetine Swelling    Throat Swelling  . Penicillins Hives  . Prednisone Swelling    Throat swelling  . Pregabalin     Other reaction(s): Other (See Comments) nervousness  . Propoxyphene N-Acetaminophen Swelling    REACTION: swelling in the throat  . Sertraline     Other reaction(s): Other (See Comments)  . Sertraline Hcl Swelling    Throat Swelling  . Sulfa Antibiotics     Other reaction(s): Unknown  . Sulfadiazine Swelling    Throat Swelling  . Tussionex Pennkinetic Er [Hydrocod Polst-Cpm Polst Er] Itching    "Sunburn"  . Verapamil Swelling    Throat Swelling    PAST MEDICAL HISTORY: Past Medical History  Diagnosis Date  . CAD (coronary artery disease)     Nonobstructive on cath 2003 and 2005  . Hypertension   . Adenocarcinoma of breast     right  . Depression   . Anal fissure   . GERD (gastroesophageal reflux disease)   . Dog bite(E906.0)   . Diverticulosis of colon (without mention of hemorrhage)   . Spinal stenosis, lumbar region, without neurogenic claudication   . Anxiety   . Panic attacks   . Asthma   . Irritable bowel syndrome   . Hiatal hernia   . Jaundice     Hx of Jaundice at age 65 from "dirty restuarant". Unsure of Hepatitis type  .  Neuropathy     bilat LE's  . Chronic back pain   . Lumbar radiculopathy     bilat LE's  . Pain management   . Headache     MEDICATIONS AT HOME: Prior to Admission medications   Medication Sig Start Date End Date Taking? Authorizing Provider  acetaminophen (TYLENOL) 500 MG tablet Take 500 mg by mouth every 6 (six) hours as needed for moderate pain.    Historical Provider, MD  albuterol (PROVENTIL HFA;VENTOLIN HFA) 108 (90 BASE) MCG/ACT inhaler Inhale 2 puffs into the lungs every 4 (four) hours as needed for wheezing or shortness of breath. 07/27/14   Noemi Chapel, MD  ALPRAZolam Duanne Moron) 0.5 MG tablet Take 1 tablet (0.5 mg total) by mouth 2 (two) times daily as needed for anxiety. 05/28/15   Kathlee Nations, MD    amitriptyline (ELAVIL) 25 MG tablet Take 1 tablet (25 mg total) by mouth at bedtime. 05/28/15   Kathlee Nations, MD  ascorbic acid (VITAMIN C) 1000 MG tablet Take 500 mg by mouth daily.     Historical Provider, MD  aspirin EC 81 MG tablet Take 81 mg by mouth daily.    Historical Provider, MD  Calcium Carbonate-Vitamin D (CALCIUM-CARB 600 + D) 600-125 MG-UNIT TABS Take 1 capsule by mouth daily.     Historical Provider, MD  cetirizine (ZYRTEC) 10 MG tablet Take 1 tablet (10 mg total) by mouth daily. 02/05/15   Tresa Garter, MD  cycloSPORINE (RESTASIS) 0.05 % ophthalmic emulsion Place 1 drop into both eyes 2 (two) times daily.      Historical Provider, MD  doxycycline (MONODOX) 100 MG capsule Take 1 capsule (100 mg total) by mouth 2 (two) times daily. 05/25/15   Norman Herrlich, MD  Fluticasone-Salmeterol (ADVAIR) 250-50 MCG/DOSE AEPB Inhale 1 puff into the lungs 2 (two) times daily. 03/12/15   Tresa Garter, MD  hydrochlorothiazide (HYDRODIURIL) 12.5 MG tablet Take 1 tablet (12.5 mg total) by mouth daily. 04/16/15   Francesca Oman, DO  metoprolol (LOPRESSOR) 25 MG tablet Take 1 tablet (25 mg total) by mouth 2 (two) times daily. 04/16/15   Francesca Oman, DO  oxymetazoline (AFRIN) 0.05 % nasal spray Place 1 spray into both nostrils 2 (two) times daily as needed for congestion.    Historical Provider, MD  pantoprazole (PROTONIX) 40 MG tablet Take 1 tablet (40 mg total) by mouth daily. 01/05/15   Tresa Garter, MD  polyethylene glycol powder (GLYCOLAX/MIRALAX) powder Take 17gm mixed with water or juice daily 02/16/15   Jerene Bears, MD  pravastatin (PRAVACHOL) 40 MG tablet Take 1 tablet (40 mg total) by mouth every evening. 07/14/14   Tresa Garter, MD  valsartan (DIOVAN) 320 MG tablet Take 1 tablet (320 mg total) by mouth daily. 12/08/14   Tresa Garter, MD  vitamin E (VITAMIN E) 400 UNIT capsule Take 400 Units by mouth daily.    Historical Provider, MD  VOLTAREN 1 % GEL Apply 2 g  topically 2 (two) times daily as needed (pain).  06/30/13   Historical Provider, MD     Objective:   Filed Vitals:   01/05/15 1446  BP: 136/84  Pulse: 87  Temp: 98.9 F (37.2 C)  TempSrc: Oral  Resp: 16  Height: 5\' 3"  (1.6 m)  Weight: 164 lb (74.39 kg)  SpO2: 97%    Exam General appearance : Awake, alert, not in any distress. Speech Clear. Not toxic looking HEENT: Atraumatic and Normocephalic, pupils  equally reactive to light and accomodation Neck: supple, no JVD. No cervical lymphadenopathy.  Chest:Good air entry bilaterally, no added sounds  CVS: S1 S2 regular, no murmurs.  Abdomen: Bowel sounds present, Non tender and not distended with no gaurding, rigidity or rebound. Extremities: B/L Lower Ext shows no edema, both legs are warm to touch Neurology: Awake alert, and oriented X 3, CN II-XII intact, Non focal Skin:No Rash  Data Review Lab Results  Component Value Date   HGBA1C 5.8* 11/26/2013   HGBA1C 6.2 06/10/2011   HGBA1C * 07/16/2010    5.9 (NOTE)                                                                       According to the ADA Clinical Practice Recommendations for 2011, when HbA1c is used as a screening test:   >=6.5%   Diagnostic of Diabetes Mellitus           (if abnormal result  is confirmed)  5.7-6.4%   Increased risk of developing Diabetes Mellitus  References:Diagnosis and Classification of Diabetes Mellitus,Diabetes [id number],6979,48(AXKPV 1):S62-S69 and Standards of Medical Care in         Diabetes - 2011,Diabetes Care,2011,34  (Suppl 1):S11-S61.     Assessment & Plan   1. Gastroesophageal reflux disease without esophagitis  - pantoprazole (PROTONIX) 40 MG tablet; Take 1 tablet (40 mg total) by mouth daily.  Dispense: 90 tablet; Refill: 3  2. Anxiety state  - Ambulatory referral to Psychiatry  3. Major depression, chronic  - Ambulatory referral to Psychiatry  Patient have been counseled extensively about nutrition and exercise  The patient  was given clear instructions to go to ER or return to medical center if symptoms don't improve, worsen or new problems develop. The patient verbalized understanding. The patient was told to call to get lab results if they haven't heard anything in the next week.   This note has been created with Surveyor, quantity. Any transcriptional errors are unintentional.    Angelica Chessman, MD, [place], [place], [place], [place] and [facility] [place], [place]

## 2015-06-11 ENCOUNTER — Telehealth: Payer: Self-pay | Admitting: Hematology and Oncology

## 2015-06-11 ENCOUNTER — Ambulatory Visit (HOSPITAL_BASED_OUTPATIENT_CLINIC_OR_DEPARTMENT_OTHER): Payer: Medicare Other | Admitting: Hematology and Oncology

## 2015-06-11 ENCOUNTER — Encounter: Payer: Self-pay | Admitting: Hematology and Oncology

## 2015-06-11 ENCOUNTER — Other Ambulatory Visit (HOSPITAL_BASED_OUTPATIENT_CLINIC_OR_DEPARTMENT_OTHER): Payer: Medicare Other

## 2015-06-11 VITALS — BP 135/72 | HR 82 | Temp 98.6°F | Resp 18 | Ht 63.0 in | Wt 170.3 lb

## 2015-06-11 DIAGNOSIS — C50211 Malignant neoplasm of upper-inner quadrant of right female breast: Secondary | ICD-10-CM | POA: Diagnosis not present

## 2015-06-11 DIAGNOSIS — I2510 Atherosclerotic heart disease of native coronary artery without angina pectoris: Secondary | ICD-10-CM | POA: Diagnosis not present

## 2015-06-11 DIAGNOSIS — Z853 Personal history of malignant neoplasm of breast: Secondary | ICD-10-CM

## 2015-06-11 LAB — COMPREHENSIVE METABOLIC PANEL (CC13)
ALBUMIN: 3.9 g/dL (ref 3.5–5.0)
ALK PHOS: 55 U/L (ref 40–150)
ALT: 25 U/L (ref 0–55)
ANION GAP: 6 meq/L (ref 3–11)
AST: 24 U/L (ref 5–34)
BILIRUBIN TOTAL: 0.59 mg/dL (ref 0.20–1.20)
BUN: 15.4 mg/dL (ref 7.0–26.0)
CO2: 29 meq/L (ref 22–29)
Calcium: 9.8 mg/dL (ref 8.4–10.4)
Chloride: 107 mEq/L (ref 98–109)
Creatinine: 0.8 mg/dL (ref 0.6–1.1)
EGFR: 78 mL/min/{1.73_m2} — AB (ref >90)
Glucose: 113 mg/dl (ref 70–140)
POTASSIUM: 3.7 meq/L (ref 3.5–5.1)
SODIUM: 142 meq/L (ref 136–145)
TOTAL PROTEIN: 6.5 g/dL (ref 6.4–8.3)

## 2015-06-11 LAB — CBC WITH DIFFERENTIAL/PLATELET
BASO%: 0.9 % (ref 0.0–2.0)
BASOS ABS: 0 10*3/uL (ref 0.0–0.1)
EOS ABS: 0.1 10*3/uL (ref 0.0–0.5)
EOS%: 1.5 % (ref 0.0–7.0)
HCT: 38.5 % (ref 34.8–46.6)
HGB: 12.9 g/dL (ref 11.6–15.9)
LYMPH%: 32.7 % (ref 14.0–49.7)
MCH: 30 pg (ref 25.1–34.0)
MCHC: 33.4 g/dL (ref 31.5–36.0)
MCV: 89.8 fL (ref 79.5–101.0)
MONO#: 0.3 10*3/uL (ref 0.1–0.9)
MONO%: 8.9 % (ref 0.0–14.0)
NEUT%: 56 % (ref 38.4–76.8)
NEUTROS ABS: 2.1 10*3/uL (ref 1.5–6.5)
PLATELETS: 184 10*3/uL (ref 145–400)
RBC: 4.29 10*6/uL (ref 3.70–5.45)
RDW: 13.7 % (ref 11.2–14.5)
WBC: 3.8 10*3/uL — AB (ref 3.9–10.3)
lymph#: 1.2 10*3/uL (ref 0.9–3.3)

## 2015-06-11 NOTE — Telephone Encounter (Signed)
Pt confirmed survivorship for next year per 09/29 POF, gave pt AVS and Calendar... KJ

## 2015-06-11 NOTE — Assessment & Plan Note (Signed)
Right breast cancer diagnosed 08/29/2007 T2, N0, M0 stage II A. pathologic staging after neoadjuvant chemotherapy completed 03/03/2008 for ER/PR negative HER-2 negative breast cancer treated with lumpectomy 05/06/2008 followed by radiation which was completed 07/22/2008  Breast Cancer Surveillance: 1. Breast exam 06/11/2015: Normal, and redness near the radiation area 2. Mammogram 02/26/2015 No abnormalities. Postsurgical changes. Breast Density Category B. I recommended that [name] get 3-D mammograms for surveillance. Discussed the differences between different breast density categories.  Return to clinic in 1 year to follow with survivorship clinic

## 2015-06-11 NOTE — Progress Notes (Signed)
Patient Care Team: Francesca Oman, DO as PCP - General (Internal Medicine)  DIAGNOSIS: Breast cancer of upper-inner quadrant of right female breast   Staging form: Breast, AJCC 6th Edition     Clinical: No stage assigned - Unsigned     Pathologic: Stage IIA (T2, N0, M0) - Signed by Rulon Eisenmenger, MD on 06/09/2014   SUMMARY OF ONCOLOGIC HISTORY:   Breast cancer of upper-inner quadrant of right female breast   08/29/2007 Mammogram Right breast mass macrolobulated 1.5 x 1.2 x 1.8 cm and 1.2 x 1.0 x 1.7 cm biopsy of lateral mass IDC ER 1% PR 0% HER-2 negative Ki-67 38%, MRI bilobed mass 4.9 x 2.6 x 4.8 cm   09/18/2007 - 03/03/2008 Neo-Adjuvant Chemotherapy FEC x4 followed by dose dense Taxotere with Xeloda 1000 mg by mouth twice a day for 8 weeks (could not tolerate increased dose of Xeloda to 1500 mg), MRI showed decreased mass 3.4 x 2.6 x 3 cm   05/06/2008 Surgery Right breast lumpectomy 3.8 cm tumor SLN negative T2, N0, M0 stage II A. pathologic staging   06/03/2008 - 07/22/2008 Radiation Therapy Radiation therapy to lumpectomy site    CHIEF COMPLIANT: surveillance of breast cancer  INTERVAL HISTORY: [name] is a a 64 year old with above-mentioned history right breast cancer treated with neo-adjuvant chemotherapy followed by lumpectomy and radiation and is currently on surveillance. Her major complaint is related to severe low back pain. This pain has been there for the past 6 years. She had previously seen a pain specialist at [facility] and had very good relief from steroidal injections. Recently after the last injection she had difficulty with ambulating. Recent skin biopsy of the right breast came back as actinic keratosis  REVIEW OF SYSTEMS:   Constitutional: Denies fevers, chills or abnormal weight loss Eyes: Denies blurriness of vision Ears, nose, mouth, throat, and face: Denies mucositis or sore throat Respiratory: Denies cough, dyspnea or wheezes Cardiovascular: Denies  palpitation, chest discomfort or lower extremity swelling Gastrointestinal:  Denies nausea, heartburn or change in bowel habits Skin: Denies abnormal skin rashes Lymphatics: Denies new lymphadenopathy or easy bruising Neurological:low back pain causing some weakness in the legs Behavioral/Psych: Mood is stable, no new changes  Breast:  denies any pain or lumps or nodules in either breasts All other systems were reviewed with the patient and are negative.  I have reviewed the past medical history, past surgical history, social history and family history with the patient and they are unchanged from previous note.  ALLERGIES:  is allergic to codeine; adhesive; amoxicillin; azithromycin; bromfed; cephalexin; chlordiazepoxide-clidinium; clotrimazole; dicyclomine hcl; effexor; gatifloxacin; hydralazine hcl; ibuprofen; iohexol; latex; lidocaine; lisinopril; librax ; paroxetine; penicillins; prednisone; pregabalin; propoxyphene n-acetaminophen; sertraline; sertraline hcl; sulfa antibiotics; sulfadiazine; tussionex pennkinetic er; and verapamil.  MEDICATIONS:  Current Outpatient Prescriptions  Medication Sig Dispense Refill  . acetaminophen (TYLENOL) 500 MG tablet Take 500 mg by mouth every 6 (six) hours as needed for moderate pain.    Marland Kitchen albuterol (PROVENTIL HFA;VENTOLIN HFA) 108 (90 BASE) MCG/ACT inhaler Inhale 2 puffs into the lungs every 4 (four) hours as needed for wheezing or shortness of breath. 1 Inhaler 3  . ALPRAZolam (XANAX) 0.5 MG tablet Take 1 tablet (0.5 mg total) by mouth 2 (two) times daily as needed for anxiety. 60 tablet 0  . amitriptyline (ELAVIL) 25 MG tablet Take 1 tablet (25 mg total) by mouth at bedtime. 30 tablet 0  . ascorbic acid (VITAMIN C) 1000 MG tablet Take 500 mg by  mouth daily.     Marland Kitchen aspirin EC 81 MG tablet Take 81 mg by mouth daily.    . Calcium Carbonate-Vitamin D (CALCIUM-CARB 600 + D) 600-125 MG-UNIT TABS Take 1 capsule by mouth daily.     . cetirizine (ZYRTEC) 10 MG  tablet Take 1 tablet (10 mg total) by mouth daily. 30 tablet 3  . cycloSPORINE (RESTASIS) 0.05 % ophthalmic emulsion Place 1 drop into both eyes 2 (two) times daily.      Marland Kitchen doxycycline (MONODOX) 100 MG capsule Take 1 capsule (100 mg total) by mouth 2 (two) times daily. 14 capsule 0  . Fluticasone-Salmeterol (ADVAIR) 250-50 MCG/DOSE AEPB Inhale 1 puff into the lungs 2 (two) times daily. 60 each 3  . hydrochlorothiazide (HYDRODIURIL) 12.5 MG tablet Take 1 tablet (12.5 mg total) by mouth daily. 90 tablet 1  . metoprolol (LOPRESSOR) 25 MG tablet Take 1 tablet (25 mg total) by mouth 2 (two) times daily. 60 tablet 1  . oxymetazoline (AFRIN) 0.05 % nasal spray Place 1 spray into both nostrils 2 (two) times daily as needed for congestion.    . pantoprazole (PROTONIX) 40 MG tablet Take 1 tablet (40 mg total) by mouth daily. 90 tablet 3  . polyethylene glycol powder (GLYCOLAX/MIRALAX) powder Take 17gm mixed with water or juice daily 255 g 3  . pravastatin (PRAVACHOL) 40 MG tablet Take 1 tablet (40 mg total) by mouth every evening. 90 tablet 3  . valsartan (DIOVAN) 320 MG tablet Take 1 tablet (320 mg total) by mouth daily. 90 tablet 3  . vitamin E (VITAMIN E) 400 UNIT capsule Take 400 Units by mouth daily.    . VOLTAREN 1 % GEL Apply 2 g topically 2 (two) times daily as needed (pain).      No current facility-administered medications for this visit.    PHYSICAL EXAMINATION: ECOG PERFORMANCE STATUS: 1 - Symptomatic but completely ambulatory  Filed Vitals:   06/11/15 1114  BP: 135/72  Pulse: 82  Temp: 98.6 F (37 C)  Resp: 18   Filed Weights   06/11/15 1114  Weight: 170 lb 4.8 oz (77.248 kg)    GENERAL:alert, no distress and comfortable SKIN: skin color, texture, turgor are normal, no rashes or significant lesions EYES: normal, Conjunctiva are pink and non-injected, sclera clear OROPHARYNX:no exudate, no erythema and lips, buccal mucosa, and tongue normal  NECK: supple, thyroid normal size,  non-tender, without nodularity LYMPH:  no palpable lymphadenopathy in the cervical, axillary or inguinal LUNGS: clear to auscultation and percussion with normal breathing effort HEART: regular rate & rhythm and no murmurs and no lower extremity edema ABDOMEN:abdomen soft, non-tender and normal bowel sounds Musculoskeletal:no cyanosis of digits and no clubbing  NEURO: alert & oriented x 3 with fluent speech, no focal motor/sensory deficits BREAST: No palpable masses or nodules in either right or left breasts. No palpable axillary supraclavicular or infraclavicular adenopathy no breast tenderness or nipple discharge. Mild redness in the right breast biopsy-proven to be actinic keratosis.(exam performed in the presence of a chaperone)  LABORATORY DATA:  I have reviewed the data as listed   Chemistry      Component Value Date/Time   NA 141 12/23/2014 1035   NA 145 06/09/2014 1515   K 4.6 12/23/2014 1035   K 3.9 06/09/2014 1515   CL 102 12/23/2014 1035   CL 104 12/07/2012 1341   CO2 31 12/23/2014 1035   CO2 29 06/09/2014 1515   BUN 18 12/23/2014 1035   BUN 14.5 06/09/2014  1515   CREATININE 0.97 12/23/2014 1035   CREATININE 0.64 11/17/2014 2345   CREATININE 0.9 06/09/2014 1515      Component Value Date/Time   CALCIUM 10.2 12/23/2014 1035   CALCIUM 10.0 06/09/2014 1515   ALKPHOS 46 12/23/2014 1035   ALKPHOS 72 06/09/2014 1515   AST 18 12/23/2014 1035   AST 16 06/09/2014 1515   ALT 18 12/23/2014 1035   ALT 18 06/09/2014 1515   BILITOT 0.6 12/23/2014 1035   BILITOT 0.91 06/09/2014 1515       Lab Results  Component Value Date   WBC 3.8* 06/11/2015   HGB 12.9 06/11/2015   HCT 38.5 06/11/2015   MCV 89.8 06/11/2015   PLT 184 06/11/2015   NEUTROABS 2.1 06/11/2015   ASSESSMENT & PLAN:  Breast cancer of upper-inner quadrant of right female breast Right breast cancer diagnosed 08/29/2007 T2, N0, M0 stage II A. pathologic staging after neoadjuvant chemotherapy completed 03/03/2008  for ER/PR negative HER-2 negative breast cancer treated with lumpectomy 05/06/2008 followed by radiation which was completed 07/22/2008  Breast Cancer Surveillance: 1. Breast exam 06/11/2015: Normal, and redness near the radiation area 2. Mammogram 02/26/2015 No abnormalities. Postsurgical changes. Breast Density Category B. I recommended that she get 3-D mammograms for surveillance. Discussed the differences between different breast density categories.  Severe low back pain since 2010: MRI of the lower back was done February 2016 and it showed facet arthritis and degenerative disc disease. She'll be discussing with her primary care physician to get a referral to see an orthopedic specialist.  Return to clinic in 1 year to follow with survivorship clinic and she will alternate to see me every other year. I discussed with her that there is no role of routine blood testing for surveillance of breast cancer. So for the next year onwards we will not be ordering routine blood work.   No orders of the defined types were placed in this encounter.   The patient has a good understanding of the overall plan. she agrees with it. she will call with any problems that may develop before the next visit here.   Rulon Eisenmenger, MD

## 2015-06-17 ENCOUNTER — Encounter: Payer: Self-pay | Admitting: Internal Medicine

## 2015-06-17 ENCOUNTER — Ambulatory Visit (INDEPENDENT_AMBULATORY_CARE_PROVIDER_SITE_OTHER): Payer: Medicare Other | Admitting: Internal Medicine

## 2015-06-17 VITALS — BP 134/83 | HR 84 | Temp 98.0°F | Ht 63.0 in | Wt 173.2 lb

## 2015-06-17 DIAGNOSIS — J45909 Unspecified asthma, uncomplicated: Secondary | ICD-10-CM | POA: Diagnosis not present

## 2015-06-17 DIAGNOSIS — J309 Allergic rhinitis, unspecified: Secondary | ICD-10-CM

## 2015-06-17 DIAGNOSIS — I10 Essential (primary) hypertension: Secondary | ICD-10-CM

## 2015-06-17 DIAGNOSIS — Z0000 Encounter for general adult medical examination without abnormal findings: Secondary | ICD-10-CM

## 2015-06-17 DIAGNOSIS — M5136 Other intervertebral disc degeneration, lumbar region: Secondary | ICD-10-CM

## 2015-06-17 DIAGNOSIS — Z23 Encounter for immunization: Secondary | ICD-10-CM | POA: Diagnosis not present

## 2015-06-17 DIAGNOSIS — M51369 Other intervertebral disc degeneration, lumbar region without mention of lumbar back pain or lower extremity pain: Secondary | ICD-10-CM

## 2015-06-17 DIAGNOSIS — F411 Generalized anxiety disorder: Secondary | ICD-10-CM | POA: Diagnosis not present

## 2015-06-17 DIAGNOSIS — H7291 Unspecified perforation of tympanic membrane, right ear: Secondary | ICD-10-CM | POA: Insufficient documentation

## 2015-06-17 DIAGNOSIS — H7290 Unspecified perforation of tympanic membrane, unspecified ear: Secondary | ICD-10-CM

## 2015-06-17 DIAGNOSIS — I2510 Atherosclerotic heart disease of native coronary artery without angina pectoris: Secondary | ICD-10-CM | POA: Diagnosis not present

## 2015-06-17 MED ORDER — FLUTICASONE-SALMETEROL 250-50 MCG/DOSE IN AEPB: 1.0000 | INHALATION_SPRAY | Freq: Two times a day (BID) | RESPIRATORY_TRACT | Status: DC

## 2015-06-17 NOTE — Progress Notes (Signed)
Subjective:    Patient ID: [name], female    DOB: 07-Jan-1951, 64 y.o.   MRN: [id number]  HPI Comments: [name] is a 64 year old woman with PMH as below here for follow-up of chronic conditions.  Please see problem based charting for status of chronic medical conditions.     Past Medical History  Diagnosis Date  . CAD (coronary artery disease)     Nonobstructive on cath 2003 and 2005  . Hypertension   . Adenocarcinoma of breast     right  . Depression   . Anal fissure   . GERD (gastroesophageal reflux disease)   . Dog bite(E906.0)   . Diverticulosis of colon (without mention of hemorrhage)   . Spinal stenosis, lumbar region, without neurogenic claudication   . Anxiety   . Panic attacks   . Asthma   . Irritable bowel syndrome   . Hiatal hernia   . Jaundice     Hx of Jaundice at age 83 from "dirty restuarant". Unsure of Hepatitis type  . Neuropathy     bilat LE's  . Chronic back pain   . Lumbar radiculopathy     bilat LE's  . Pain management   . Headache    Current Outpatient Prescriptions on File Prior to Visit  Medication Sig Dispense Refill  . acetaminophen (TYLENOL) 500 MG tablet Take 500 mg by mouth every 6 (six) hours as needed for moderate pain.    Marland Kitchen albuterol (PROVENTIL HFA;VENTOLIN HFA) 108 (90 BASE) MCG/ACT inhaler Inhale 2 puffs into the lungs every 4 (four) hours as needed for wheezing or shortness of breath. 1 Inhaler 3  . ALPRAZolam (XANAX) 0.5 MG tablet Take 1 tablet (0.5 mg total) by mouth 2 (two) times daily as needed for anxiety. 60 tablet 0  . amitriptyline (ELAVIL) 25 MG tablet Take 1 tablet (25 mg total) by mouth at bedtime. 30 tablet 0  . ascorbic acid (VITAMIN C) 1000 MG tablet Take 500 mg by mouth daily.     Marland Kitchen aspirin EC 81 MG tablet Take 81 mg by mouth daily.    . Calcium Carbonate-Vitamin D (CALCIUM-CARB 600 + D) 600-125 MG-UNIT TABS Take 1 capsule by mouth daily.     . cetirizine (ZYRTEC) 10 MG tablet Take 1 tablet (10 mg total) by  mouth daily. 30 tablet 3  . cycloSPORINE (RESTASIS) 0.05 % ophthalmic emulsion Place 1 drop into both eyes 2 (two) times daily.      Marland Kitchen doxycycline (MONODOX) 100 MG capsule Take 1 capsule (100 mg total) by mouth 2 (two) times daily. 14 capsule 0  . Fluticasone-Salmeterol (ADVAIR) 250-50 MCG/DOSE AEPB Inhale 1 puff into the lungs 2 (two) times daily. 60 each 3  . hydrochlorothiazide (HYDRODIURIL) 12.5 MG tablet Take 1 tablet (12.5 mg total) by mouth daily. 90 tablet 1  . metoprolol (LOPRESSOR) 25 MG tablet Take 1 tablet (25 mg total) by mouth 2 (two) times daily. 60 tablet 1  . oxymetazoline (AFRIN) 0.05 % nasal spray Place 1 spray into both nostrils 2 (two) times daily as needed for congestion.    . pantoprazole (PROTONIX) 40 MG tablet Take 1 tablet (40 mg total) by mouth daily. 90 tablet 3  . polyethylene glycol powder (GLYCOLAX/MIRALAX) powder Take 17gm mixed with water or juice daily 255 g 3  . pravastatin (PRAVACHOL) 40 MG tablet Take 1 tablet (40 mg total) by mouth every evening. 90 tablet 3  . valsartan (DIOVAN) 320 MG tablet Take 1 tablet (  320 mg total) by mouth daily. 90 tablet 3  . vitamin E (VITAMIN E) 400 UNIT capsule Take 400 Units by mouth daily.    . VOLTAREN 1 % GEL Apply 2 g topically 2 (two) times daily as needed (pain).      No current facility-administered medications on file prior to visit.    Review of Systems  Constitutional: Negative for fever and chills.  HENT: Positive for ear pain and tinnitus. Negative for ear discharge and sore throat.   Respiratory: Negative for shortness of breath.   Cardiovascular: Negative for chest pain and palpitations.  Genitourinary: Negative for difficulty urinating.       Filed Vitals:   06/17/15 1401 06/17/15 1505  BP: 145/80 134/83  Pulse: 84 84  Temp: 98 F (36.7 C)   TempSrc: Oral   Height: 5\' 3"  (1.6 m)   Weight: 173 lb 3.2 oz (78.563 kg)   SpO2: 98%     Objective:   Physical Exam  Constitutional: She is oriented to  person, place, and time. She appears well-developed. No distress.  HENT:  Head: Normocephalic and atraumatic.  Right Ear: External ear normal.  Left Ear: External ear normal.  Mouth/Throat: Oropharynx is clear and moist. No oropharyngeal exudate.  Right TM with small posterior perforation; no drainage noted.  Mild maxillary sinus TTP.  Eyes: Conjunctivae and EOM are normal. Pupils are equal, round, and reactive to light. Right eye exhibits no discharge. Left eye exhibits no discharge. No scleral icterus.  Neck: Neck supple.  Cardiovascular: Normal rate, regular rhythm and normal heart sounds.  Exam reveals no gallop and no friction rub.   No murmur heard. Pulmonary/Chest: Effort normal and breath sounds normal. No respiratory distress. She has no wheezes. She has no rales.  Abdominal: Soft. Bowel sounds are normal. She exhibits no distension. There is no tenderness. There is no rebound.  Musculoskeletal: Normal range of motion. She exhibits edema. She exhibits no tenderness.  1+ B/L ankle edema.  Neurological: She is alert and oriented to person, place, and time. No cranial nerve deficit.  Skin: Skin is warm. She is not diaphoretic.  Psychiatric: She has a normal mood and affect. Her behavior is normal. Judgment and thought content normal.  Vitals reviewed.         Assessment & Plan:  Please see problem based charting for A&P.

## 2015-06-17 NOTE — Assessment & Plan Note (Signed)
BP Readings from Last 3 Encounters:  06/17/15 145/80  06/11/15 135/72  05/28/15 164/92    Lab Results  Component Value Date   NA 142 06/11/2015   K 3.7 06/11/2015   CREATININE 0.8 06/11/2015    Assessment: Blood pressure control:  well controlled Progress toward BP goal:   at goal Comments: Compliant with therapy.  No ADRs.    Plan: Medications:  continue current medications:  HCTZ 12.5mg  daily, Diovan 320mg  daily, metoprolol 25mg  BID Educational resources provided:   Self management tools provided:   Other plans: RTC in 3 months follow-up.

## 2015-06-17 NOTE — Assessment & Plan Note (Addendum)
MRI stable.  Daily back pain and radicular symptoms.  Pain responds well to Voltaren gel prn, Tylenol (at most 2 per day), warm showers. - To follow-up with pain specialist, Dr. Darral Dash next week.   - continue Voltaren gel and Tylenol 650mg  prn (she takes twice daily at most).

## 2015-06-17 NOTE — Assessment & Plan Note (Signed)
She feels her mood is stable.  Recently increased amitryptiline increased to 25mg  and she notes some improvement.  Psychiatry is now prescribing the TCA and xanax 0.5mg  BID.  Will continue to let them rx psych medications and patient is agreeable.

## 2015-06-17 NOTE — Assessment & Plan Note (Signed)
Nasal congestion, rhinorrhea, itchy watery eyes.  Has resumed flonase with improvement in symptoms.

## 2015-06-17 NOTE — Assessment & Plan Note (Signed)
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  She says the right ear has given her problems from time to time over the years when she has sinus problems.  The last time she was told she had a right ear perf was about 10 years ago.  Exam last month notes TM intact.  She had symptoms c/w with viral sinusitis at that time but was given rx for abx to use if symptoms persisted.  She took one pill of abx rx but did not take any more because she felt she improved.  She notes ear pain started on Friday along with tinnitus.  She has subjectively decreased right sided hearing loss on exam.  She denies severe pain that suddenly improved following drainage.  She denies any drainage from the ear.  Since she did have infection three weeks ago this may be AOM related infection (viral vs bacterial), however no otorrhea.  She uses qtips so may be traumatic perf.  Sounds like this has been chronic recurring issue. - patient advised to take abx (she has nearly complete course of doxy at home)  - no ear drops, qtips or any objects in the ear - patient to avoid immersion/swimming in order to keep water from entering middle ear - I expect TM will heal on its own in next few weeks to months - advise she return for follow-up in two weeks to check on symptoms (hearing, tinnitus) and may need ENT referral if symptoms worsening or not improved

## 2015-06-17 NOTE — Patient Instructions (Signed)
1. I will send in antibiotic to your pharmacy.  I would like you to return in 2 weeks to check your ear and sinus symptoms.  Please do not put anything in your ears.  For your back pain, continue Tylenol and Voltaren gel.  I will also refer you to physical therapy (someone will call you to arrange the visit).   2. Please take all medications as prescribed.    3. If you have worsening of your symptoms or new symptoms arise, please call the clinic ([phone]), or go to the ER immediately if symptoms are severe.   Tympanic Membrane Perforation The eardrum (tympanic membrane) protects the inner ear from the outside environment. In addition to protection, the eardrum allows you to hear by transmitting sound waves to the bones in your ear and then to the nervous system. The tympanic membrane is easily perforated, which may result in damage to the inner ear. SYMPTOMS   Sometimes there are no symptoms.  Decreased hearing.  Fluid drainage from ear.  Ear pain. CAUSES   Most commonly, a middle ear infection from built-up pressure.  Injury from a cotton swab.  Traumatic injury to the side of the head. RISK INCREASES WITH:  Frequent middle ear infections.  Use of cotton swabs. PREVENTION   Do not use cotton swabs to clean the ear canal.  If you have ear pain or pressure, see your caregiver to rule out an ear infection that needs treatment. TREATMENT  Protecting the inner ear and allowing the membrane to heal on its own is how tympanic membrane rupture is usually treated. Healing may take several weeks. In order to protect the inner ear, do not allow any fluid to enter the ear canal. Avoid being submerged in water. The use of ear drops may prevent an ear infection from developing, but they should be used with caution, as ear drops can also cause damage to the inner ear. It is important to follow up with your caregiver to confirm healing of the tympanic membrane. If the membrane does not heal,  permanent hearing loss may occur. To avoid serious complications, tympanic membranes that do not heal on their own are repaired with surgery.   This information is not intended to replace advice given to you by your health care provider. Make sure you discuss any questions you have with your health care provider.   Document Released: 08/29/2005 Document Revised: 11/21/2011 Document Reviewed: 03/11/2015 Elsevier Interactive Patient Education [employer].

## 2015-06-18 DIAGNOSIS — Z0000 Encounter for general adult medical examination without abnormal findings: Secondary | ICD-10-CM | POA: Insufficient documentation

## 2015-06-18 NOTE — Assessment & Plan Note (Signed)
Patient requested refill of Advair.  Denies dyspnea.  Will reassess symptoms severity at next visit (she is following up in 2 weeks) and discuss step down therapy.

## 2015-06-18 NOTE — Assessment & Plan Note (Signed)
Flu shot given this visit. 

## 2015-06-21 ENCOUNTER — Other Ambulatory Visit: Payer: Self-pay | Admitting: Internal Medicine

## 2015-06-23 ENCOUNTER — Ambulatory Visit: Payer: Federal, State, Local not specified - Other | Admitting: Psychiatry

## 2015-06-25 ENCOUNTER — Ambulatory Visit (INDEPENDENT_AMBULATORY_CARE_PROVIDER_SITE_OTHER): Payer: Medicare Other | Admitting: Psychiatry

## 2015-06-25 ENCOUNTER — Encounter (HOSPITAL_COMMUNITY): Payer: Self-pay | Admitting: Psychiatry

## 2015-06-25 VITALS — BP 129/78 | HR 98 | Ht 63.0 in | Wt 172.4 lb

## 2015-06-25 DIAGNOSIS — F4323 Adjustment disorder with mixed anxiety and depressed mood: Secondary | ICD-10-CM | POA: Diagnosis not present

## 2015-06-25 DIAGNOSIS — I2510 Atherosclerotic heart disease of native coronary artery without angina pectoris: Secondary | ICD-10-CM

## 2015-06-25 DIAGNOSIS — Z7189 Other specified counseling: Secondary | ICD-10-CM | POA: Diagnosis not present

## 2015-06-25 DIAGNOSIS — F411 Generalized anxiety disorder: Secondary | ICD-10-CM

## 2015-06-25 MED ORDER — ALPRAZOLAM 0.5 MG PO TABS: 0.5000 mg | ORAL_TABLET | Freq: Two times a day (BID) | ORAL | Status: DC | PRN

## 2015-06-25 MED ORDER — AMITRIPTYLINE HCL 25 MG PO TABS: 25.0000 mg | ORAL_TABLET | Freq: Every day | ORAL | Status: DC
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
## 2015-06-26 NOTE — Progress Notes (Signed)
[facility] Medicine Clinic Attending  Case discussed with Dr. Wilson soon after the resident saw the patient.  We reviewed the resident's history and exam and pertinent patient test results.  I agree with the assessment, diagnosis, and plan of care documented in the resident's note.  

## 2015-07-01 ENCOUNTER — Ambulatory Visit (INDEPENDENT_AMBULATORY_CARE_PROVIDER_SITE_OTHER): Payer: Medicare Other | Admitting: Psychiatry

## 2015-07-01 ENCOUNTER — Other Ambulatory Visit: Payer: Self-pay | Admitting: Internal Medicine

## 2015-07-01 DIAGNOSIS — Z7189 Other specified counseling: Secondary | ICD-10-CM

## 2015-07-01 DIAGNOSIS — F4323 Adjustment disorder with mixed anxiety and depressed mood: Secondary | ICD-10-CM | POA: Diagnosis not present

## 2015-07-02 ENCOUNTER — Ambulatory Visit: Payer: Medicare Other | Attending: Internal Medicine

## 2015-07-02 DIAGNOSIS — M5441 Lumbago with sciatica, right side: Secondary | ICD-10-CM | POA: Diagnosis not present

## 2015-07-02 DIAGNOSIS — R29898 Other symptoms and signs involving the musculoskeletal system: Secondary | ICD-10-CM

## 2015-07-02 DIAGNOSIS — M25659 Stiffness of unspecified hip, not elsewhere classified: Secondary | ICD-10-CM | POA: Insufficient documentation

## 2015-07-02 DIAGNOSIS — M5442 Lumbago with sciatica, left side: Secondary | ICD-10-CM | POA: Diagnosis not present
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
## 2015-07-03 DIAGNOSIS — Z01419 Encounter for gynecological examination (general) (routine) without abnormal findings: Secondary | ICD-10-CM | POA: Diagnosis not present

## 2015-07-03 DIAGNOSIS — R3915 Urgency of urination: Secondary | ICD-10-CM | POA: Diagnosis not present

## 2015-07-06 ENCOUNTER — Telehealth (HOSPITAL_COMMUNITY): Payer: Self-pay

## 2015-07-06 DIAGNOSIS — G8929 Other chronic pain: Secondary | ICD-10-CM | POA: Diagnosis not present

## 2015-07-06 DIAGNOSIS — M545 Low back pain: Secondary | ICD-10-CM | POA: Diagnosis not present

## 2015-07-06 NOTE — Telephone Encounter (Signed)
Medication Management - Left patient a message after she left one requesting a call back about problems with her medication as a "sleep aid" but left no specifics.  Requested pt. call back to discuss further and to follow up.

## 2015-07-07 ENCOUNTER — Encounter: Payer: Self-pay | Admitting: Physical Therapy

## 2015-07-07 ENCOUNTER — Ambulatory Visit: Payer: Medicare Other | Admitting: Physical Therapy

## 2015-07-07 DIAGNOSIS — M5442 Lumbago with sciatica, left side: Principal | ICD-10-CM

## 2015-07-07 DIAGNOSIS — R29898 Other symptoms and signs involving the musculoskeletal system: Secondary | ICD-10-CM

## 2015-07-07 DIAGNOSIS — M25659 Stiffness of unspecified hip, not elsewhere classified: Secondary | ICD-10-CM

## 2015-07-07 DIAGNOSIS — M5441 Lumbago with sciatica, right side: Secondary | ICD-10-CM

## 2015-07-07 DIAGNOSIS — D485 Neoplasm of uncertain behavior of skin: Secondary | ICD-10-CM | POA: Diagnosis not present

## 2015-07-07 NOTE — Patient Instructions (Signed)
Double Knee to Chest (Flexion)   Gently pull both knees toward chest. Feel stretch in lower back or buttock area. Breathing deeply, Hold  20  seconds. Repeat _3___ times. Do _2-3 ___ sessions per day.  Knee to Chest (Flexion)   Pull knee toward chest. Feel stretch in lower back or buttock area. Breathing deeply, Hold 20  seconds. Repeat with other knee. Repeat _3___ times on each leg.  Do _2-3___ sessions per day.   Lumbar Rotation: Caudal - Bilateral (Supine)   Feet and knees together, arms outstretched, rock knees left and right, staying within shoulder distance ( man in picture is going too far) ,relaxing muscles of low back. Perform for 1 minute. Relax. Repeat _3___ times per set.  Do __3__ sessions per day.    Variation hold for 20 sec each side x 3.    Hip Flexor Stretch   Lying on back near edge of bed, bend one leg, foot flat. Hang other leg over edge, relaxed, thigh resting entirely on bed for  30 secs. Repeat ___3_ times. Do __3__ sessions per day. Advanced Exercise: Bend knee back keeping thigh in contact with bed.  Copyright  VHI. All rights reserved.

## 2015-07-07 NOTE — Therapy (Signed)
[facility] [address] [place], [place], [address] Phone: [phone]   Fax:  [phone]  Physical Therapy Treatment  Patient Details  Name: [name] MRN: [id number] Date of Birth: 02/03/1951 Referring Provider: Duwaine Maxin, MD  Encounter Date: 07/07/2015      PT End of Session - 07/07/15 1039    Visit Number 2   Number of Visits 10   Date for PT Re-Evaluation 08/27/15   PT Start Time 8185   PT Stop Time 1115   PT Time Calculation (min) 60 min   Activity Tolerance Patient limited by pain   Behavior During Therapy [facility] for tasks assessed/performed      Past Medical History  Diagnosis Date  . CAD (coronary artery disease)     Nonobstructive on cath 2003 and 2005  . Hypertension   . Adenocarcinoma of breast ([place])     right  . Depression   . Anal fissure   . GERD (gastroesophageal reflux disease)   . Dog bite(E906.0)   . Diverticulosis of colon (without mention of hemorrhage)   . Spinal stenosis, lumbar region, without neurogenic claudication   . Anxiety   . Panic attacks   . Asthma   . Irritable bowel syndrome   . Hiatal hernia   . Jaundice     Hx of Jaundice at age 77 from "dirty restuarant". Unsure of Hepatitis type  . Neuropathy ([place])     bilat LE's  . Chronic back pain   . Lumbar radiculopathy     bilat LE's  . Pain management   . Headache     Past Surgical History  Procedure Laterality Date  . Partial hysterectomy  1987  . Cardiac catheterization  2003, 2005  . Cholecystectomy    . Bladder repair      tact  . Breast lumpectomy Right   . Breast reconstruction Right   . Breast reduction surgery Left   . Colonoscopy  2013    Diverticulosis  . Cataract extraction    . Esophageal manometry  10/08/2012    Procedure: ESOPHAGEAL MANOMETRY (EM);  Surgeon: Sable Feil, MD;  Location: [facility];  Service: Endoscopy;  Laterality: N/A;  . Esophagogastroduodenoscopy  2014   Normal     There were no vitals filed for this visit.  Visit Diagnosis:  Bilateral low back pain with sciatica, sciatica laterality unspecified  Stiffness of hip joint, unspecified laterality  Weakness of both lower extremities      Subjective Assessment - 07/07/15 1023    Subjective Pt with complain of pain in low back and both legs 6/10. I can not live my life as before the shot in March of 2016   Pertinent History Breast cancer 8 years ago. Pain due to walking dog -lab 85#, Pt had injection into spine 11/2014 and Lt LE "numb" and sharp pain at times.    Currently in Pain? Yes   Pain Score 6    Pain Location Back   Pain Orientation Right;Left   Pain Descriptors / Indicators Aching;Burning;Shooting   Pain Type Chronic pain   Pain Onset More than a month ago   Pain Frequency Constant   Multiple Pain Sites Yes   Pain Score 6   Pain Orientation Left;Right   Pain Descriptors / Indicators Numbness;Sharp   Pain Type Chronic pain   Pain Onset More than a month ago   Pain Frequency Intermittent  [place] Adult PT Treatment/Exercise - 07/07/15 0001    Bed Mobility   Bed Mobility --  Pt brought in Pulse Massager, but advised to order smaller     Exercises   Exercises Lumbar   Lumbar Exercises: Stretches   Single Knee to Chest Stretch 3 reps;20 seconds  each leg   Double Knee to Chest Stretch 3 reps;20 seconds   Lower Trunk Rotation 3 reps;20 seconds   Modalities   Modalities Electrical Stimulation;Cryotherapy   Cryotherapy   Number Minutes Cryotherapy 15 Minutes   Cryotherapy Location Lumbar Spine   Electrical Stimulation   Electrical Stimulation Location lumbar spine   Electrical Stimulation Action IFC   Electrical Stimulation Parameters 2min   Electrical Stimulation Goals Pain                PT Education - 07/07/15 1038    Education provided Yes   Education Details SKC, BKC, trunk rotation. Discussed disadvantage of  electrical unit she brought in today versus smaller TENS unit   Person(s) Educated Patient          PT Short Term Goals - 07/07/15 1042    PT SHORT TERM GOAL #1   Title be independent in initial HEP   Time 4   Period Weeks   Status On-going   PT SHORT TERM GOAL #2   Title report 25% reduction in LBP/LE pain with ADLs and self-care   Time 4   Period Weeks   Status On-going           PT Long Term Goals - 07/07/15 1043    PT LONG TERM GOAL #1   Title be independent in advanced HEP   Time 8   Period Weeks   Status On-going   PT LONG TERM GOAL #2   Title reduce FOTO to < or = to 42% limitation   Time 8   Period Weeks   Status On-going   PT LONG TERM GOAL #3   Title report 50% reduction in LBP and LE pain with ADLs and self-care   Time 8   Period Weeks   Status On-going   PT LONG TERM GOAL #4   Title demonstrate 4/5 bilateral LE strength to improve endurance and safety   Time 8   Period Weeks   Status On-going               Plan - 07/07/15 1040    Clinical Impression Statement Pt presents to PT with chronic low back pain and bil LE pain. Pt was able to perform gentle stretching with incr discomfort with trunkrotation to right side.  Pt will continue to benefit from skilled PT for painmanagment, flexibility and strength.    Pt will benefit from skilled therapeutic intervention in order to improve on the following deficits Decreased range of motion;Difficulty walking;Pain;Decreased strength;Impaired flexibility;Decreased activity tolerance;Improper body mechanics   Rehab Potential Good   PT Frequency 2x / week   PT Duration 8 weeks   PT Treatment/Interventions ADLs/Self Care Home Management;Cryotherapy;Electrical Stimulation;Moist Heat;Therapeutic exercise;Therapeutic activities;Functional mobility training;Neuromuscular re-education;Patient/family education;Manual techniques;Passive range of motion   PT Next Visit Plan Review stretching exercise SKC, BKC LTR,  core stability, ice and e-stim   Consulted and Agree with Plan of Care Patient        Problem List Patient Active Problem List   Diagnosis Date Noted  . Healthcare maintenance 06/18/2015  . Perforated right tympanic membrane on examination 06/17/2015  . Acute sinusitis 05/25/2015  . Blood in stool  04/03/2015  . Muscle spasm of back 02/19/2015  . Lumbar degenerative disc disease 02/19/2015  . Major depression, chronic ([place]) 01/05/2015  . Hypokalemia 12/08/2014  . Esophageal dysphagia 11/27/2014  . Chronic ethmoidal sinusitis 09/18/2014  . Anxiety state 07/14/2014  . Allergic rhinitis 01/10/2014  . Edema 01/09/2014  . HLD (hyperlipidemia) 12/23/2013  . Chronic pain associated with significant psychosocial dysfunction 08/28/2013  . GERD (gastroesophageal reflux disease) 05/13/2011  . Asthma 11/26/2007  . DEPRESSION 09/28/2007  . Breast cancer of upper-inner quadrant of right female breast ([place]) 09/28/2007  . ABRASION/FRICION BURN OTH MX&UNS SITE W/O INF 09/24/2007  . ANAL FISSURE 08/21/2007  . Essential hypertension 03/29/2007  . Coronary atherosclerosis 03/29/2007  . DIVERTICULOSIS, COLON 03/29/2007    NAUMANN-HOUEGNIFIO,Krystie Leiter PTA 07/07/2015, 11:01 AM  [facility] [address], [place], [place], [address] Phone: [phone]   Fax:  [phone]  Name: MERRILL DEANDA MRN: [id number] Date of Birth: 02/05/1951

## 2015-07-08 ENCOUNTER — Encounter: Payer: Self-pay | Admitting: Internal Medicine

## 2015-07-08 ENCOUNTER — Ambulatory Visit (INDEPENDENT_AMBULATORY_CARE_PROVIDER_SITE_OTHER): Payer: Medicare Other | Admitting: Internal Medicine

## 2015-07-08 ENCOUNTER — Telehealth (HOSPITAL_COMMUNITY): Payer: Self-pay

## 2015-07-08 VITALS — BP 163/82 | HR 73 | Temp 98.3°F | Ht 63.0 in | Wt 171.6 lb

## 2015-07-08 DIAGNOSIS — Z7982 Long term (current) use of aspirin: Secondary | ICD-10-CM

## 2015-07-08 DIAGNOSIS — H7291 Unspecified perforation of tympanic membrane, right ear: Secondary | ICD-10-CM

## 2015-07-08 DIAGNOSIS — R059 Cough, unspecified: Secondary | ICD-10-CM

## 2015-07-08 DIAGNOSIS — J4520 Mild intermittent asthma, uncomplicated: Secondary | ICD-10-CM

## 2015-07-08 DIAGNOSIS — J45909 Unspecified asthma, uncomplicated: Secondary | ICD-10-CM

## 2015-07-08 DIAGNOSIS — R05 Cough: Secondary | ICD-10-CM

## 2015-07-08 NOTE — Telephone Encounter (Signed)
I returned phone call and left a message to call us back. 

## 2015-07-08 NOTE — Progress Notes (Signed)
Subjective:    Patient ID: [name], female    DOB: 06/04/1951, 64 y.o.   MRN: [id number]  HPI Comments: [name] is a 64 year old woman with PMH as below here for follow-up of ear pain and cough.   Please see problem based charting for status of these conditions.   Otalgia  There is pain in the right ear. This is a chronic problem. The current episode started 1 to 4 weeks ago. There has been no fever. Associated symptoms include coughing, hearing loss and rhinorrhea. Pertinent negatives include no ear discharge or headaches. She has tried acetaminophen for the symptoms.     Past Medical History  Diagnosis Date  . CAD (coronary artery disease)     Nonobstructive on cath 2003 and 2005  . Hypertension   . Adenocarcinoma of breast ([place])     right  . Depression   . Anal fissure   . GERD (gastroesophageal reflux disease)   . Dog bite(E906.0)   . Diverticulosis of colon (without mention of hemorrhage)   . Spinal stenosis, lumbar region, without neurogenic claudication   . Anxiety   . Panic attacks   . Asthma   . Irritable bowel syndrome   . Hiatal hernia   . Jaundice     Hx of Jaundice at age 41 from "dirty restuarant". Unsure of Hepatitis type  . Neuropathy ([place])     bilat LE's  . Chronic back pain   . Lumbar radiculopathy     bilat LE's  . Pain management   . Headache    Current Outpatient Prescriptions on File Prior to Visit  Medication Sig Dispense Refill  . acetaminophen (TYLENOL) 500 MG tablet Take 500 mg by mouth every 6 (six) hours as needed for moderate pain.    Marland Kitchen ALPRAZolam (XANAX) 0.5 MG tablet Take 1 tablet (0.5 mg total) by mouth 2 (two) times daily as needed for anxiety. 60 tablet 1  . amitriptyline (ELAVIL) 25 MG tablet Take 1 tablet (25 mg total) by mouth at bedtime. 30 tablet 1  . ascorbic acid (VITAMIN C) 1000 MG tablet Take 500 mg by mouth daily.     Marland Kitchen aspirin EC 81 MG tablet Take 81 mg by mouth daily.    . Calcium Carbonate-Vitamin D  (CALCIUM-CARB 600 + D) 600-125 MG-UNIT TABS Take 1 capsule by mouth daily.     . cetirizine (ZYRTEC) 10 MG tablet Take 1 tablet (10 mg total) by mouth daily. 30 tablet 3  . cycloSPORINE (RESTASIS) 0.05 % ophthalmic emulsion Place 1 drop into both eyes 2 (two) times daily.      Marland Kitchen doxycycline (MONODOX) 100 MG capsule Take 1 capsule (100 mg total) by mouth 2 (two) times daily. 14 capsule 0  . Fluticasone-Salmeterol (ADVAIR) 250-50 MCG/DOSE AEPB Inhale 1 puff into the lungs 2 (two) times daily. 60 each 1  . hydrochlorothiazide (HYDRODIURIL) 12.5 MG tablet Take 1 tablet (12.5 mg total) by mouth daily. 90 tablet 1  . metoprolol (LOPRESSOR) 25 MG tablet Take 1 tablet (25 mg total) by mouth 2 (two) times daily. 60 tablet 1  . oxymetazoline (AFRIN) 0.05 % nasal spray Place 1 spray into both nostrils 2 (two) times daily as needed for congestion.    . pantoprazole (PROTONIX) 40 MG tablet Take 1 tablet (40 mg total) by mouth daily. 90 tablet 3  . polyethylene glycol powder (GLYCOLAX/MIRALAX) powder Take 17gm mixed with water or juice daily 255 g 3  . pravastatin (PRAVACHOL) 40  MG tablet Take 1 tablet (40 mg total) by mouth every evening. 90 tablet 3  . PROAIR HFA 108 (90 BASE) MCG/ACT inhaler INHALE 2 PUFFS BY MOUTH IN TO THE LUNGS EVERY 4 HOURS AS NEEDED FOR WHEEZING OR SHORTNESS OF BREATH 8.5 Inhaler 3  . valsartan (DIOVAN) 320 MG tablet Take 1 tablet (320 mg total) by mouth daily. 90 tablet 3  . vitamin E (VITAMIN E) 400 UNIT capsule Take 400 Units by mouth daily.    . VOLTAREN 1 % GEL Apply 2 g topically 2 (two) times daily as needed (pain).      No current facility-administered medications on file prior to visit.    Review of Systems  Constitutional: Positive for chills.  HENT: Positive for ear pain, hearing loss and rhinorrhea. Negative for ear discharge.   Eyes: Positive for itching.  Respiratory: Positive for cough and wheezing. Negative for shortness of breath.        Increasing productive cough  x three days.  Normally does not need Proair at all but has needed it about twice per day in past three days.    Neurological: Negative for headaches.       Filed Vitals:   07/08/15 1556  BP: 163/82  Pulse: 82  Temp: 98.3 F (36.8 C)  TempSrc: Oral  Height: 5\' 3"  (1.6 m)  Weight: 171 lb 9.6 oz (77.837 kg)  SpO2: 100%     Objective:   Physical Exam  Constitutional: She is oriented to person, place, and time. She appears well-developed. No distress.  HENT:  Head: Normocephalic and atraumatic.  Right Ear: External ear normal.  Left Ear: External ear normal.  Mouth/Throat: Oropharynx is clear and moist. No oropharyngeal exudate.  Right TM is partially obscured by wax.  Left TM appears to have small perf.  There is no erythema, edema or drainage B/L ear canals.  No scaly skin or ulcers of skin of ear or surrounding area.  Right mastoid slightly TTP, no mastoid erythema or edema. There is no pain when the ear is pulled.  Eyes: Conjunctivae and EOM are normal. Pupils are equal, round, and reactive to light. Right eye exhibits no discharge. Left eye exhibits no discharge. No scleral icterus.  Neck: Neck supple.  Left submandibular area TTP  Cardiovascular: Normal rate, regular rhythm and normal heart sounds.  Exam reveals no gallop and no friction rub.   No murmur heard. Pulmonary/Chest: Effort normal and breath sounds normal. No respiratory distress. She has no wheezes. She has no rales.  Abdominal: Soft. Bowel sounds are normal. She exhibits no distension. There is no tenderness. There is no rebound.  Musculoskeletal: Normal range of motion. She exhibits edema.  1+ B/L LE edema  Lymphadenopathy:    She has no cervical adenopathy.  Neurological: She is alert and oriented to person, place, and time. No cranial nerve deficit.  Subjective decreased hearing on right.  Skin: Skin is warm. She is not diaphoretic.  Psychiatric: She has a normal mood and affect. Her behavior is normal.  Judgment and thought content normal.  Vitals reviewed.         Assessment & Plan:  Please see problem based charting for A&P.

## 2015-07-08 NOTE — Assessment & Plan Note (Addendum)
Still has some right ear discomfort, itching of outer ear but no drainage, erythema, edema or severe pain to suggest otitis externa.  No erythema or edema of mastoid to suggest mastoiditis.  She feels that she does not hear as well out of the right ear when using the telephone.  Had one episode of tinnitus since she saw me.  Finished abx since she last saw me.  Has not been putting things in her ear since I last saw her.  On today's exam, right TM is obscured due to wax and now left ear appears to have small perf.  She has no left ear symptoms.  She does blow her nose every morning.  This has been chronic recurring problem since perf as a child.   - refer to ENT given persistent ear pain and subjective hearing loss - she will need hearing evaluation - continue to avoid ear drops, qtips or other objects in the ear

## 2015-07-08 NOTE — Patient Instructions (Signed)
1. I am sending you to ENT for ear symptoms.     2. Please take all medications as prescribed.    3. If you have worsening of your symptoms or new symptoms arise, please call the clinic ([phone]), or go to the ER immediately if symptoms are severe.  Acute Bronchitis Bronchitis is inflammation of the airways that extend from the windpipe into the lungs (bronchi). The inflammation often causes mucus to develop. This leads to a cough, which is the most common symptom of bronchitis.  In acute bronchitis, the condition usually develops suddenly and goes away over time, usually in a couple weeks. Smoking, allergies, and asthma can make bronchitis worse. Repeated episodes of bronchitis may cause further lung problems.  CAUSES Acute bronchitis is most often caused by the same virus that causes a cold. The virus can spread from person to person (contagious) through coughing, sneezing, and touching contaminated objects. SIGNS AND SYMPTOMS   Cough.   Fever.   Coughing up mucus.   Body aches.   Chest congestion.   Chills.   Shortness of breath.   Sore throat.  DIAGNOSIS  Acute bronchitis is usually diagnosed through a physical exam. Your health care provider will also ask you questions about your medical history. Tests, such as chest X-rays, are sometimes done to rule out other conditions.  TREATMENT  Acute bronchitis usually goes away in a couple weeks. Oftentimes, no medical treatment is necessary. Medicines are sometimes given for relief of fever or cough. Antibiotic medicines are usually not needed but may be prescribed in certain situations. In some cases, an inhaler may be recommended to help reduce shortness of breath and control the cough. A cool mist vaporizer may also be used to help thin bronchial secretions and make it easier to clear the chest.  HOME CARE INSTRUCTIONS  Get plenty of rest.   Drink enough fluids to keep your urine clear or pale yellow (unless you have a  medical condition that requires fluid restriction). Increasing fluids may help thin your respiratory secretions (sputum) and reduce chest congestion, and it will prevent dehydration.   Take medicines only as directed by your health care provider.  If you were prescribed an antibiotic medicine, finish it all even if you start to feel better.  Avoid smoking and secondhand smoke. Exposure to cigarette smoke or irritating chemicals will make bronchitis worse. If you are a smoker, consider using nicotine gum or skin patches to help control withdrawal symptoms. Quitting smoking will help your lungs heal faster.   Reduce the chances of another bout of acute bronchitis by washing your hands frequently, avoiding people with cold symptoms, and trying not to touch your hands to your mouth, nose, or eyes.   Keep all follow-up visits as directed by your health care provider.  SEEK MEDICAL CARE IF: Your symptoms do not improve after 1 week of treatment.  SEEK IMMEDIATE MEDICAL CARE IF:  You develop an increased fever or chills.   You have chest pain.   You have severe shortness of breath.  You have bloody sputum.   You develop dehydration.  You faint or repeatedly feel like you are going to pass out.  You develop repeated vomiting.  You develop a severe headache. MAKE SURE YOU:   Understand these instructions.  Will watch your condition.  Will get help right away if you are not doing well or get worse.   This information is not intended to replace advice given to you by your health  care provider. Make sure you discuss any questions you have with your health care provider.   Document Released: 10/06/2004 Document Revised: 09/19/2014 Document Reviewed: 02/19/2013 Elsevier Interactive Patient Education [employer].

## 2015-07-09 ENCOUNTER — Ambulatory Visit: Payer: Medicare Other | Admitting: Physical Therapy

## 2015-07-09 ENCOUNTER — Other Ambulatory Visit: Payer: Self-pay | Admitting: Internal Medicine

## 2015-07-09 DIAGNOSIS — M5442 Lumbago with sciatica, left side: Principal | ICD-10-CM

## 2015-07-09 DIAGNOSIS — M25659 Stiffness of unspecified hip, not elsewhere classified: Secondary | ICD-10-CM

## 2015-07-09 DIAGNOSIS — H7291 Unspecified perforation of tympanic membrane, right ear: Secondary | ICD-10-CM

## 2015-07-09 DIAGNOSIS — R29898 Other symptoms and signs involving the musculoskeletal system: Secondary | ICD-10-CM | POA: Diagnosis not present

## 2015-07-09 DIAGNOSIS — M5441 Lumbago with sciatica, right side: Secondary | ICD-10-CM

## 2015-07-09 DIAGNOSIS — H9191 Unspecified hearing loss, right ear: Secondary | ICD-10-CM

## 2015-07-09 DIAGNOSIS — H9201 Otalgia, right ear: Secondary | ICD-10-CM

## 2015-07-09 DIAGNOSIS — J209 Acute bronchitis, unspecified: Secondary | ICD-10-CM | POA: Insufficient documentation

## 2015-07-09 NOTE — Assessment & Plan Note (Signed)
Peak flow today is 250 which is about 65% of predicted.  However, she has no S&S of an asthma exacerbation.  She is in no respiratory distress, able to speak in full sentences, not using accessory muscles to breath, SpO2 100% on RA, no wheezing on exam and has used her rescue inhaler twice per day in past three days (for cough, not dyspnea).  Furthermore, her asthma has been well controlled, no frequent exacerbations or prior hx of hospitalization or intubation for asthma. Will use this peak flow of 65% as her normal baseline moving forward.  Will continue to evaluate for appropriateness of step down therapy at future visits.

## 2015-07-09 NOTE — Therapy (Addendum)
[facility] [address] [place], [place], [address] Phone: [phone]   Fax:  [phone]  Physical Therapy Treatment  Patient Details  Name: [name] MRN: [id number] Date of Birth: December 15, 1950 Referring Provider: Duwaine Maxin, MD  Encounter Date: 07/09/2015      PT End of Session - 07/09/15 1139    Visit Number 3   Number of Visits 10   Date for PT Re-Evaluation 08/27/15   PT Start Time 1100   PT Stop Time 1156   PT Time Calculation (min) 56 min   Activity Tolerance Patient limited by pain   Behavior During Therapy [facility] for tasks assessed/performed      Past Medical History  Diagnosis Date  . CAD (coronary artery disease)     Nonobstructive on cath 2003 and 2005  . Hypertension   . Adenocarcinoma of breast ([place])     right  . Depression   . Anal fissure   . GERD (gastroesophageal reflux disease)   . Dog bite(E906.0)   . Diverticulosis of colon (without mention of hemorrhage)   . Spinal stenosis, lumbar region, without neurogenic claudication   . Anxiety   . Panic attacks   . Asthma   . Irritable bowel syndrome   . Hiatal hernia   . Jaundice     Hx of Jaundice at age 78 from "dirty restuarant". Unsure of Hepatitis type  . Neuropathy ([place])     bilat LE's  . Chronic back pain   . Lumbar radiculopathy     bilat LE's  . Pain management   . Headache     Past Surgical History  Procedure Laterality Date  . Partial hysterectomy  1987  . Cardiac catheterization  2003, 2005  . Cholecystectomy    . Bladder repair      tact  . Breast lumpectomy Right   . Breast reconstruction Right   . Breast reduction surgery Left   . Colonoscopy  2013    Diverticulosis  . Cataract extraction    . Esophageal manometry  10/08/2012    Procedure: ESOPHAGEAL MANOMETRY (EM);  Surgeon: Sable Feil, MD;  Location: [facility];  Service: Endoscopy;  Laterality: N/A;  . Esophagogastroduodenoscopy  2014   Normal     There were no vitals filed for this visit.  Visit Diagnosis:  Bilateral low back pain with sciatica, sciatica laterality unspecified  Stiffness of hip joint, unspecified laterality  Weakness of both lower extremities                       [facility] Adult PT Treatment/Exercise - 07/09/15 0001    Bed Mobility   Bed Mobility --  No UBE or [place] due to hx of breast cancer   Lumbar Exercises: Stretches   Active Hamstring Stretch --  attempted HS stretch on left LE, but cause incr of pain in R   Single Knee to Chest Stretch 3 reps;20 seconds   Double Knee to Chest Stretch 3 reps;20 seconds   Lower Trunk Rotation 3 reps;20 seconds   Piriformis Stretch --  attrempted but discomfort in gluteal area increased   Lumbar Exercises: Aerobic   Stationary Bike Nu-step level 1 x 67mn seat#5, arms #9   Lumbar Exercises: Standing   Other Standing Lumbar Exercises standing against red physioball isometric contracion   needs mof vc's for technique   Modalities   Modalities Electrical Stimulation;Cryotherapy   Cryotherapy   Number Minutes Cryotherapy 15  Minutes   Cryotherapy Location Lumbar Spine   Type of Cryotherapy Ice pack   Electrical Stimulation   Electrical Stimulation Location lumbar spine   Electrical Stimulation Action IFC   Electrical Stimulation Parameters 15   Electrical Stimulation Goals Pain                  PT Short Term Goals - 07/07/15 1042    PT SHORT TERM GOAL #1   Title be independent in initial HEP   Time 4   Period Weeks   Status On-going   PT SHORT TERM GOAL #2   Title report 25% reduction in LBP/LE pain with ADLs and self-care   Time 4   Period Weeks   Status On-going           PT Long Term Goals - 07/07/15 1043    PT LONG TERM GOAL #1   Title be independent in advanced HEP   Time 8   Period Weeks   Status On-going   PT LONG TERM GOAL #2   Title reduce FOTO to < or = to 42% limitation   Time 8   Period Weeks    Status On-going   PT LONG TERM GOAL #3   Title report 50% reduction in LBP and LE pain with ADLs and self-care   Time 8   Period Weeks   Status On-going   PT LONG TERM GOAL #4   Title demonstrate 4/5 bilateral LE strength to improve endurance and safety   Time 8   Period Weeks   Status On-going               Plan - 07/09/15 1139    Clinical Impression Statement Pt presents to PT with chronic low back pain and bil LE pain. Pt was aable to tolerate trunkrotation today. Pt will continue to benefit from skilled PT for painmanagment , flexibility and strength   Pt will benefit from skilled therapeutic intervention in order to improve on the following deficits Decreased range of motion;Difficulty walking;Pain;Decreased strength;Impaired flexibility;Decreased activity tolerance;Improper body mechanics   Rehab Potential Good   PT Frequency 2x / week   PT Duration 8 weeks   PT Treatment/Interventions ADLs/Self Care Home Management;Cryotherapy;Electrical Stimulation;Moist Heat;Therapeutic exercise;Therapeutic activities;Functional mobility training;Neuromuscular re-education;Patient/family education;Manual techniques;Passive range of motion   PT Next Visit Plan Review stretching exercise SKC, BKC LTR, core stability, ice and e-stim, try HS stretch in sitting   Consulted and Agree with Plan of Care Patient        Problem List Patient Active Problem List   Diagnosis Date Noted  . Healthcare maintenance 06/18/2015  . Perforated right tympanic membrane on examination 06/17/2015  . Acute sinusitis 05/25/2015  . Blood in stool 04/03/2015  . Muscle spasm of back 02/19/2015  . Lumbar degenerative disc disease 02/19/2015  . Major depression, chronic ([place]) 01/05/2015  . Hypokalemia 12/08/2014  . Esophageal dysphagia 11/27/2014  . Chronic ethmoidal sinusitis 09/18/2014  . Anxiety state 07/14/2014  . Allergic rhinitis 01/10/2014  . Edema 01/09/2014  . HLD (hyperlipidemia) 12/23/2013  .  Chronic pain associated with significant psychosocial dysfunction 08/28/2013  . GERD (gastroesophageal reflux disease) 05/13/2011  . Asthma 11/26/2007  . DEPRESSION 09/28/2007  . Breast cancer of upper-inner quadrant of right female breast ([place]) 09/28/2007  . ABRASION/FRICION BURN OTH MX&UNS SITE W/O INF 09/24/2007  . ANAL FISSURE 08/21/2007  . Essential hypertension 03/29/2007  . Coronary atherosclerosis 03/29/2007  . DIVERTICULOSIS, COLON 03/29/2007    NAUMANN-HOUEGNIFIO,Brason Berthelot PTA 07/09/2015, 11:45  AM PHYSICAL THERAPY DISCHARGE SUMMARY G-codes: Other PT Primary Goal status: CK, D/C status CK Visits from Start of Care: 3  Current functional level related to goals / functional outcomes: See above.  Pt attended 3 PT sessions and called to cancel all appts as she felt worse after PT.     Remaining deficits: See above for current status.     Education / Equipment: HEP Plan: Patient agrees to discharge.  Patient goals were not met. Patient is being discharged due to the patient's request.  ?????   Sigurd Sos, PT 08/12/2015 10:22 AM  [facility] [address], [place], [place], [address] Phone: [phone]   Fax:  [phone]  Name: [name] MRN: [id number] Date of Birth: Nov 02, 1950

## 2015-07-09 NOTE — Assessment & Plan Note (Addendum)
Patient with c/o productive cough x 3 days and increased inhaler use (~ twice per day for past three days), however she was seen by my colleague with symptoms c/w acute sinusitis, including cough about 6 weeks ago.  She says it is her asthma acting up but SpO2 is 100% on RA, lungs are CTA on exam, no accessory muscle use or signs of resp distress to suggest asthma exacerbation.  She also reports that she does not feel short of breath but uses the inhaler when she has the cough.  She has no fever, hypoxia, tachypnea or abnormal lung exam to suggest PNA.  She received flu shot a few weeks ago and no fever, body aches, headache to suggest flu.   She has completed course of doxy recent given ear pain and perf TM following acute sinusitis and no purulent rhinorrhea to suggest bacterial sinusitis.  She has hx of allergy, asthma (well controlled, has not need to use her inhaler until 3 days ago, never hospitalized or intubated for asthma) and GERD which could all potentially contribute to cough.  I suspect her cough is a combo of her allergy and possibly viral URI or bronchitis; the cough can linger for weeks.  She notes these symptoms often occur during change in temp and weather has gone from warm to cold quite rapidly in recent days.  - continue Advair and prn albuterol; no indication for steroid as symptoms are not c/w asthma exac - continue Zyrtec for allergy  - continue PPI for GERD - symptomatic care - hydration, Tylenol, OTC cough meds - return to clinic if symptoms worsened or not improved in next week - call 911 if dyspnea that does not respond to inhaler or other severe symptoms; call clinic for appt if less severe symptoms

## 2015-07-13 NOTE — Progress Notes (Signed)
Case discussed with Dr. Wilson soon after the resident saw the patient. We reviewed the resident's history and exam and pertinent patient test results. I agree with the assessment, diagnosis, and plan of care documented in the resident's note. 

## 2015-07-14 ENCOUNTER — Ambulatory Visit (INDEPENDENT_AMBULATORY_CARE_PROVIDER_SITE_OTHER): Payer: Medicare Other | Admitting: Psychiatry

## 2015-07-14 DIAGNOSIS — F4323 Adjustment disorder with mixed anxiety and depressed mood: Secondary | ICD-10-CM | POA: Diagnosis not present

## 2015-07-14 DIAGNOSIS — Z7189 Other specified counseling: Secondary | ICD-10-CM

## 2015-07-16 ENCOUNTER — Ambulatory Visit: Payer: Medicare Other | Admitting: Physical Therapy

## 2015-07-17 DIAGNOSIS — H9191 Unspecified hearing loss, right ear: Secondary | ICD-10-CM | POA: Diagnosis not present

## 2015-07-17 DIAGNOSIS — Z8669 Personal history of other diseases of the nervous system and sense organs: Secondary | ICD-10-CM | POA: Diagnosis not present

## 2015-07-17 DIAGNOSIS — H9202 Otalgia, left ear: Secondary | ICD-10-CM | POA: Diagnosis not present

## 2015-07-21 ENCOUNTER — Encounter: Payer: Self-pay | Admitting: Physical Therapy

## 2015-07-23 ENCOUNTER — Encounter: Payer: Self-pay | Admitting: Physical Therapy

## 2015-07-28 ENCOUNTER — Encounter: Payer: Self-pay | Admitting: Physical Therapy

## 2015-07-28 ENCOUNTER — Ambulatory Visit: Payer: Federal, State, Local not specified - Other | Admitting: Psychiatry

## 2015-07-29 ENCOUNTER — Encounter: Payer: Self-pay | Admitting: Internal Medicine

## 2015-07-29 ENCOUNTER — Ambulatory Visit (INDEPENDENT_AMBULATORY_CARE_PROVIDER_SITE_OTHER): Payer: Medicare Other | Admitting: Internal Medicine

## 2015-07-29 VITALS — BP 151/83 | HR 74 | Temp 98.2°F | Ht 63.0 in | Wt 174.2 lb

## 2015-07-29 DIAGNOSIS — M5136 Other intervertebral disc degeneration, lumbar region: Secondary | ICD-10-CM

## 2015-07-29 DIAGNOSIS — J309 Allergic rhinitis, unspecified: Secondary | ICD-10-CM

## 2015-07-29 DIAGNOSIS — I2510 Atherosclerotic heart disease of native coronary artery without angina pectoris: Secondary | ICD-10-CM

## 2015-07-29 DIAGNOSIS — H7290 Unspecified perforation of tympanic membrane, unspecified ear: Secondary | ICD-10-CM

## 2015-07-29 DIAGNOSIS — Z79899 Other long term (current) drug therapy: Secondary | ICD-10-CM | POA: Diagnosis not present

## 2015-07-29 DIAGNOSIS — E785 Hyperlipidemia, unspecified: Secondary | ICD-10-CM

## 2015-07-29 DIAGNOSIS — H7291 Unspecified perforation of tympanic membrane, right ear: Secondary | ICD-10-CM

## 2015-07-29 MED ORDER — FLUTICASONE PROPIONATE 50 MCG/ACT NA SUSP: 1.0000 | Freq: Every day | NASAL | Status: DC

## 2015-07-29 MED ORDER — PRAVASTATIN SODIUM 40 MG PO TABS: 40.0000 mg | ORAL_TABLET | Freq: Every evening | ORAL | Status: DC

## 2015-07-29 NOTE — Progress Notes (Signed)
Subjective:    Patient ID: [name], female    DOB: [birth year], 64 y.o.   MRN: [id number]  HPI Comments: [name] is a 64 year old woman with PMH as below here for follow-up of ear pain.  Please see problem based charting for status of chronic medical conditions.    Past Medical History  Diagnosis Date  . CAD (coronary artery disease)     Nonobstructive on cath 2003 and 2005  . Hypertension   . Adenocarcinoma of breast ([place])     right  . Depression   . Anal fissure   . GERD (gastroesophageal reflux disease)   . Dog bite(E906.0)   . Diverticulosis of colon (without mention of hemorrhage)   . Spinal stenosis, lumbar region, without neurogenic claudication   . Anxiety   . Panic attacks   . Asthma   . Irritable bowel syndrome   . Hiatal hernia   . Jaundice     Hx of Jaundice at age 64 from "dirty restuarant". Unsure of Hepatitis type  . Neuropathy ([place])     bilat LE's  . Chronic back pain   . Lumbar radiculopathy     bilat LE's  . Pain management   . Headache    Current Outpatient Prescriptions on File Prior to Visit  Medication Sig Dispense Refill  . acetaminophen (TYLENOL) 500 MG tablet Take 500 mg by mouth every 6 (six) hours as needed for moderate pain.    Marland Kitchen ALPRAZolam (XANAX) 0.5 MG tablet Take 1 tablet (0.5 mg total) by mouth 2 (two) times daily as needed for anxiety. 60 tablet 1  . amitriptyline (ELAVIL) 25 MG tablet Take 1 tablet (25 mg total) by mouth at bedtime. 30 tablet 1  . ascorbic acid (VITAMIN C) 1000 MG tablet Take 500 mg by mouth daily.     Marland Kitchen aspirin EC 81 MG tablet Take 81 mg by mouth daily.    . Calcium Carbonate-Vitamin D (CALCIUM-CARB 600 + D) 600-125 MG-UNIT TABS Take 1 capsule by mouth daily.     . cetirizine (ZYRTEC) 10 MG tablet Take 1 tablet (10 mg total) by mouth daily. 30 tablet 3  . cycloSPORINE (RESTASIS) 0.05 % ophthalmic emulsion Place 1 drop into both eyes 2 (two) times daily.      . Fluticasone-Salmeterol (ADVAIR) 250-50 MCG/DOSE  AEPB Inhale 1 puff into the lungs 2 (two) times daily. 60 each 1  . hydrochlorothiazide (HYDRODIURIL) 12.5 MG tablet Take 1 tablet (12.5 mg total) by mouth daily. 90 tablet 1  . metoprolol (LOPRESSOR) 25 MG tablet Take 1 tablet (25 mg total) by mouth 2 (two) times daily. 60 tablet 1  . oxymetazoline (AFRIN) 0.05 % nasal spray Place 1 spray into both nostrils 2 (two) times daily as needed for congestion.    . pantoprazole (PROTONIX) 40 MG tablet Take 1 tablet (40 mg total) by mouth daily. 90 tablet 3  . polyethylene glycol powder (GLYCOLAX/MIRALAX) powder Take 17gm mixed with water or juice daily 255 g 3  . pravastatin (PRAVACHOL) 40 MG tablet Take 1 tablet (40 mg total) by mouth every evening. 90 tablet 3  . PROAIR HFA 108 (90 BASE) MCG/ACT inhaler INHALE 2 PUFFS BY MOUTH IN TO THE LUNGS EVERY 4 HOURS AS NEEDED FOR WHEEZING OR SHORTNESS OF BREATH 8.5 Inhaler 3  . valsartan (DIOVAN) 320 MG tablet Take 1 tablet (320 mg total) by mouth daily. 90 tablet 3  . vitamin E (VITAMIN E) 400 UNIT capsule Take 400 Units  by mouth daily.    . VOLTAREN 1 % GEL Apply 2 g topically 2 (two) times daily as needed (pain).      No current facility-administered medications on file prior to visit.    Review of Systems  Constitutional: Positive for unexpected weight change. Negative for fever, chills and appetite change.       She estimates about 5 pound unexplained weight gain in past 1 month.  HENT: Positive for hearing loss, postnasal drip, rhinorrhea, sinus pressure and sneezing. Negative for ear discharge and ear pain.        Stable decreased hearing loss on right.    Respiratory: Negative for shortness of breath and wheezing.        No increase rescue inhaler use (may use a few times per week).  Gastrointestinal: Positive for diarrhea. Negative for nausea, vomiting, constipation and blood in stool.       Two isolated episodes of loose stool yesterday and the day before.  Resolved.       Filed Vitals:    07/29/15 1432  BP: 151/83  Pulse: 74  Temp: 98.2 F (36.8 C)  TempSrc: Oral  Height: 5\' 3"  (1.6 m)  Weight: 174 lb 3.2 oz (79.017 kg)  SpO2: 98%     Objective:   Physical Exam  Constitutional: She is oriented to person, place, and time. She appears well-developed. No distress.  HENT:  Head: Normocephalic and atraumatic.  Mouth/Throat: Oropharynx is clear and moist. No oropharyngeal exudate.  Eyes: EOM are normal. Pupils are equal, round, and reactive to light.  Neck: Neck supple.  Cardiovascular: Normal rate, regular rhythm and normal heart sounds.  Exam reveals no gallop and no friction rub.   No murmur heard. Pulmonary/Chest: Effort normal and breath sounds normal. No respiratory distress. She has no wheezes. She has no rales.  Abdominal: Soft. Bowel sounds are normal. She exhibits no distension. There is no tenderness. There is no rebound and no guarding.  Musculoskeletal: Normal range of motion. She exhibits no edema or tenderness.  Neurological: She is alert and oriented to person, place, and time. No cranial nerve deficit.  Skin: Skin is warm. She is not diaphoretic.  Psychiatric: She has a normal mood and affect. Her behavior is normal. Judgment and thought content normal.  Vitals reviewed.         Assessment & Plan:  Please see problem based charting for assessment and plan.

## 2015-07-29 NOTE — Assessment & Plan Note (Signed)
Records from ENT (Dr. Redmond Baseman) reviewed.  There was no evidence of perf of either ear at the time of his exam (07/17/15).  She declined hearing eval at that visit.  ENT recommends follow-up prn.

## 2015-07-29 NOTE — Assessment & Plan Note (Addendum)
Patient felt more pain during PT so she opts not to return to PT.  No red flag symptoms.  Ice, voltaren and prn Tylenol work for the pain.  She says steroid injections have helped in the past.  She prefers to be referred to ortho for injection instead of returning to Dr. Darral [facility]) where she previously had injections.  She is not interested in surgery. - referral placed to ortho - continue prn ice, voltaren gel and Tylenol

## 2015-07-29 NOTE — Assessment & Plan Note (Signed)
Allergy symptoms worse in AM.  She has only being taking Flonase intermittently.  She has the bottle with her and it lists a 2014 expiration date.  I have asked her to discard and I will send new rx. - rx flonase, start with 2 sprays to each nostril daily and then can decrease to 1 spray daily once symptoms are better controlled - continue Zyrtec

## 2015-07-30 ENCOUNTER — Encounter: Payer: Self-pay | Admitting: Physical Therapy

## 2015-07-31 NOTE — Progress Notes (Signed)
[facility] Medicine Clinic Attending  Case discussed with Dr. Wilson soon after the resident saw the patient.  We reviewed the resident's history and exam and pertinent patient test results.  I agree with the assessment, diagnosis, and plan of care documented in the resident's note.  

## 2015-08-03 DIAGNOSIS — M545 Low back pain: Secondary | ICD-10-CM | POA: Diagnosis not present

## 2015-08-04 ENCOUNTER — Encounter: Payer: Self-pay | Admitting: Physical Therapy

## 2015-08-04 ENCOUNTER — Ambulatory Visit: Payer: Federal, State, Local not specified - Other | Admitting: Psychiatry

## 2015-08-04 DIAGNOSIS — M5416 Radiculopathy, lumbar region: Secondary | ICD-10-CM | POA: Diagnosis not present

## 2015-08-11 ENCOUNTER — Encounter: Payer: Self-pay | Admitting: Physical Therapy

## 2015-08-18 ENCOUNTER — Encounter: Payer: Self-pay | Admitting: Diagnostic Neuroimaging

## 2015-08-18 ENCOUNTER — Ambulatory Visit (INDEPENDENT_AMBULATORY_CARE_PROVIDER_SITE_OTHER): Payer: Medicare Other | Admitting: Diagnostic Neuroimaging

## 2015-08-18 VITALS — BP 156/90 | HR 76 | Ht 63.0 in | Wt 173.0 lb

## 2015-08-18 DIAGNOSIS — M545 Low back pain, unspecified: Secondary | ICD-10-CM

## 2015-08-18 NOTE — Progress Notes (Signed)
[facility]  PATIENT: [name] DOB: 05-07-1951  REFERRING CLINICIAN: Hillery Jacks HISTORY FROM: patient  REASON FOR VISIT: new consult    HISTORICAL  CHIEF COMPLAINT:  Chief Complaint  Patient presents with  . Bilateral LE paresthesia    rm 6, New Patient    HISTORY OF PRESENT ILLNESS:   64 year old female with history of breast cancer, here for evaluation of low back pain and lower extremity numbness and tingling.  2008 patient was diagnosed with right breast mass, diagnosed with breast cancer, treated with chemotherapy and radiation therapy and completed treatment in 2009. Patient did not develop any numbness, tingling or pain during or immediately after treatment.  Around 2010 patient had onset of low back pain, had MRI scan of the lumbar spine, then referred to pain management in [place] with Dr. Darral Dash. She underwent pain management therapy with right medication, but unfortunately could not tolerate a number of these medications. She had a variety of adverse side effects and allergic reactions. She was then treated with intermittent lumbar epidural steroid injections. These worked fairly well until March 2016, when she feels that a different provider gave her the injection and did not administered correctly. She had significant side effects and lack of pain control.  Due to this patient requested second opinion with a different pain management doctor with orthopedic surgery clinic. She went to see Dr. Ron Agee, who requested neurologic consultation to see if patient may have radiation or chemotherapy induced neuropathy.    REVIEW OF SYSTEMS: Full 14 system review of systems performed and notable only for headache dizziness anxiety not asleep decreased energy disinterest activities joint pain cramps allergies feeling hot eye pain weight gain swelling in legs.  ALLERGIES: Allergies  Allergen Reactions  . Codeine Rash  . Adhesive [Tape] Other (See  Comments)    blisters  . Amoxicillin Swelling    Throat Swells  . Azithromycin Swelling    Throat Swelling  . Bromfed Swelling    Throat Swelling  . Cephalexin Swelling    Throat Swelling  . Chlordiazepoxide-Clidinium Swelling    Throat Swelling  . Clotrimazole Other (See Comments) and Hypertension    Patient told me that she couldn't swallow due to the medication  . Dicyclomine Hcl Hives  . Effexor [Venlafaxine] Hives and Itching  . Gabapentin     Nausea, weakness  . Gatifloxacin     Other reaction(s): Other (See Comments)  . Hydralazine Hcl     Rash and itching  . Ibuprofen     N/V  . Iohexol      Code: HIVES, Desc: throat swelling no hives 20 yrs ago;needs pre-medication  09/19/07 sg, Onset Date: [id number]   . Latex Swelling    Blisters on Skin  . Lidocaine Hives  . Lisinopril     Other reaction(s): Angioedema (ALLERGY/intolerance)  . Librax  [Chlordiazepoxide-Clidinium]     Other reaction(s): Other (See Comments)  . Paroxetine Swelling    Throat Swelling  . Penicillins Hives  . Prednisone Swelling    Throat swelling  . Pregabalin     Other reaction(s): Other (See Comments) nervousness  . Propoxyphene N-Acetaminophen Swelling    REACTION: swelling in the throat  . Sertraline     Other reaction(s): Other (See Comments)  . Sertraline Hcl Swelling    Throat Swelling  . Sulfa Antibiotics     Other reaction(s): Unknown  . Sulfadiazine Swelling    Throat Swelling  . Tussionex Pennkinetic Er [Hydrocod Polst-Cpm Polst Er]  Itching    "Sunburn"  . Verapamil Swelling    Throat Swelling    HOME MEDICATIONS: Outpatient Prescriptions Prior to Visit  Medication Sig Dispense Refill  . ALPRAZolam (XANAX) 0.5 MG tablet Take 1 tablet (0.5 mg total) by mouth 2 (two) times daily as needed for anxiety. 60 tablet 1  . amitriptyline (ELAVIL) 25 MG tablet Take 1 tablet (25 mg total) by mouth at bedtime. 30 tablet 1  . ascorbic acid (VITAMIN C) 1000 MG tablet Take 500 mg by mouth  daily.     Marland Kitchen aspirin EC 81 MG tablet Take 81 mg by mouth daily.    . Calcium Carbonate-Vitamin D (CALCIUM-CARB 600 + D) 600-125 MG-UNIT TABS Take 1 capsule by mouth daily.     . cetirizine (ZYRTEC) 10 MG tablet Take 1 tablet (10 mg total) by mouth daily. 30 tablet 3  . cycloSPORINE (RESTASIS) 0.05 % ophthalmic emulsion Place 1 drop into both eyes 2 (two) times daily.      . fluticasone (FLONASE) 50 MCG/ACT nasal spray Place 1-2 sprays into both nostrils daily. 16 g 3  . Fluticasone-Salmeterol (ADVAIR) 250-50 MCG/DOSE AEPB Inhale 1 puff into the lungs 2 (two) times daily. 60 each 1  . hydrochlorothiazide (HYDRODIURIL) 12.5 MG tablet Take 1 tablet (12.5 mg total) by mouth daily. 90 tablet 1  . metoprolol (LOPRESSOR) 25 MG tablet Take 1 tablet (25 mg total) by mouth 2 (two) times daily. 60 tablet 1  . oxymetazoline (AFRIN) 0.05 % nasal spray Place 1 spray into both nostrils 2 (two) times daily as needed for congestion.    . pantoprazole (PROTONIX) 40 MG tablet Take 1 tablet (40 mg total) by mouth daily. 90 tablet 3  . polyethylene glycol powder (GLYCOLAX/MIRALAX) powder Take 17gm mixed with water or juice daily 255 g 3  . pravastatin (PRAVACHOL) 40 MG tablet Take 1 tablet (40 mg total) by mouth every evening. 90 tablet 1  . PROAIR HFA 108 (90 BASE) MCG/ACT inhaler INHALE 2 PUFFS BY MOUTH IN TO THE LUNGS EVERY 4 HOURS AS NEEDED FOR WHEEZING OR SHORTNESS OF BREATH 8.5 Inhaler 3  . valsartan (DIOVAN) 320 MG tablet Take 1 tablet (320 mg total) by mouth daily. 90 tablet 3  . vitamin E (VITAMIN E) 400 UNIT capsule Take 400 Units by mouth daily.    . VOLTAREN 1 % GEL Apply 2 g topically 2 (two) times daily as needed (pain).     Marland Kitchen acetaminophen (TYLENOL) 500 MG tablet Take 500 mg by mouth every 6 (six) hours as needed for moderate pain.     No facility-administered medications prior to visit.    PAST MEDICAL HISTORY: Past Medical History  Diagnosis Date  . CAD (coronary artery disease)      Nonobstructive on cath 2003 and 2005  . Hypertension   . Adenocarcinoma of breast ([place]) 2009    right, s/p chemo/ xrt  . Depression   . Anal fissure   . GERD (gastroesophageal reflux disease)   . Dog bite(E906.0)   . Diverticulosis of colon (without mention of hemorrhage)   . Spinal stenosis, lumbar region, without neurogenic claudication   . Anxiety   . Panic attacks   . Asthma   . Irritable bowel syndrome   . Hiatal hernia   . Jaundice     Hx of Jaundice at age 70 from "dirty restuarant". Unsure of Hepatitis type  . Neuropathy ([place])     bilat LE's  . Chronic back pain   .  Lumbar radiculopathy     bilat LE's  . Pain management   . Headache     PAST SURGICAL HISTORY: Past Surgical History  Procedure Laterality Date  . Partial hysterectomy  1987  . Cardiac catheterization  2003, 2005  . Cholecystectomy    . Bladder repair      tact  . Breast lumpectomy Right   . Breast reconstruction Right   . Breast reduction surgery Left   . Colonoscopy  2013    Diverticulosis  . Cataract extraction    . Esophageal manometry  10/08/2012    Procedure: ESOPHAGEAL MANOMETRY (EM);  Surgeon: Sable Feil, MD;  Location: [facility];  Service: Endoscopy;  Laterality: N/A;  . Esophagogastroduodenoscopy  2014    Normal     FAMILY HISTORY: Family History  Problem Relation Age of Onset  . Hypertension Mother   . Heart disease Mother   . Dementia Mother   . Arthritis Mother   . Diabetes Mother   . Colon polyps Mother   . Prostate cancer Father   . Hypertension Father   . Colon polyps Father   . Lung cancer Maternal Uncle   . Hypertension Sister   . Hypertension Brother   . Heart disease Sister   . Rectal cancer Neg Hx   . Stomach cancer Neg Hx   . Colon cancer Cousin   . Inflammatory bowel disease Sister     SOCIAL HISTORY:  Social History   Social History  . Marital Status: Divorced    Spouse Name: Rush Landmark  . Number of Children: 4  . Years of Education: 14    Occupational History  . Teacher     retired   Social History Main Topics  . Smoking status: Former Smoker -- 0.30 packs/day for 8 years    Types: Cigarettes    Quit date: 09/13/1983  . Smokeless tobacco: Never Used  . Alcohol Use: No  . Drug Use: No  . Sexual Activity: Not on file   Other Topics Concern  . Not on file   Social History Narrative   LIVES AT [place]   Caffeine use- sometimes in candy only     PHYSICAL EXAM   GENERAL EXAM/CONSTITUTIONAL: Vitals:  Filed Vitals:   08/18/15 1052  BP: 156/90  Pulse: 76  Height: 5\' 3"  (1.6 m)  Weight: 173 lb (78.472 kg)     Body mass index is 30.65 kg/(m^2).  Visual Acuity Screening   Right eye Left eye Both eyes  Without correction: 20/30 20/30   With correction:        Patient is in no distress; well developed, nourished and groomed; neck is supple  CARDIOVASCULAR:  Examination of carotid arteries is normal; no carotid bruits  Regular rate and rhythm, no murmurs  Examination of peripheral vascular system by observation and palpation is normal  EYES:  Ophthalmoscopic exam of optic discs and posterior segments is normal; no papilledema or hemorrhages  MUSCULOSKELETAL:  Gait, strength, tone, movements noted in Neurologic exam below  NEUROLOGIC: MENTAL STATUS:  No flowsheet data found.  awake, alert, oriented to person, place and time  recent and remote memory intact  normal attention and concentration  language fluent, comprehension intact, naming intact,   fund of knowledge appropriate  CRANIAL NERVE:   2nd - no papilledema on fundoscopic exam  2nd, 3rd, 4th, 6th - pupils equal and reactive to light, visual fields full to confrontation, extraocular muscles intact, no nystagmus  5th - facial  sensation symmetric  7th - facial strength symmetric  8th - hearing intact  9th - palate elevates symmetrically, uvula midline  11th - shoulder shrug symmetric  12th - tongue  protrusion midline  MOTOR:   normal bulk and tone, full strength in the BUE; LIMITED IN BLE DUE TO PAIN  SENSORY:   normal and symmetric to light touch, temperature, vibration; DECR PP IN LEFT HAND AND RIGHT FOOT  COORDINATION:   finger-nose-finger, fine finger movements normal  REFLEXES:   deep tendon reflexes TRACE and symmetric  GAIT/STATION:   narrow based gait; DIFF WITH TANDEM; romberg is negative    DIAGNOSTIC DATA (LABS, IMAGING, TESTING) - I reviewed patient records, labs, notes, testing and imaging myself where available.  Lab Results  Component Value Date   WBC 3.8* 06/11/2015   HGB 12.9 06/11/2015   HCT 38.5 06/11/2015   MCV 89.8 06/11/2015   PLT 184 06/11/2015      Component Value Date/Time   NA 142 06/11/2015 1057   NA 141 12/23/2014 1035   K 3.7 06/11/2015 1057   K 4.6 12/23/2014 1035   CL 102 12/23/2014 1035   CL 104 12/07/2012 1341   CO2 29 06/11/2015 1057   CO2 31 12/23/2014 1035   GLUCOSE 113 06/11/2015 1057   GLUCOSE 114* 12/23/2014 1035   GLUCOSE 110* 12/07/2012 1341   BUN 15.4 06/11/2015 1057   BUN 18 12/23/2014 1035   CREATININE 0.8 06/11/2015 1057   CREATININE 0.97 12/23/2014 1035   CREATININE 0.64 11/17/2014 2345   CALCIUM 9.8 06/11/2015 1057   CALCIUM 10.2 12/23/2014 1035   PROT 6.5 06/11/2015 1057   PROT 6.6 12/23/2014 1035   ALBUMIN 3.9 06/11/2015 1057   ALBUMIN 4.2 12/23/2014 1035   AST 24 06/11/2015 1057   AST 18 12/23/2014 1035   ALT 25 06/11/2015 1057   ALT 18 12/23/2014 1035   ALKPHOS 55 06/11/2015 1057   ALKPHOS 46 12/23/2014 1035   BILITOT 0.59 06/11/2015 1057   BILITOT 0.6 12/23/2014 1035   GFRNONAA 63 12/23/2014 1035   GFRNONAA >90 11/17/2014 2345   GFRAA 73 12/23/2014 1035   GFRAA >90 11/17/2014 2345   Lab Results  Component Value Date   CHOL 158 05/12/2014   HDL 45 05/12/2014   LDLCALC 72 05/12/2014   TRIG 203* 05/12/2014   CHOLHDL 3.5 05/12/2014   Lab Results  Component Value Date   HGBA1C 5.8*  11/26/2013   Lab Results  Component Value Date   VITAMINB12 298 08/16/2013   Lab Results  Component Value Date   TSH 1.416 01/09/2014    03/03/15 MRI lumbar spine [I reviewed images myself and agree with interpretation. -VRP]  - Transitional anatomy at the lumbosacral junction. Numbering scheme is the same as that used on the most recent MRI with the transitional segment labeled L5. Correlation with plain films is recommended prior to any intervention.  - No change in mild multilevel degenerative disease.    ASSESSMENT AND PLAN  64 y.o. year old female here with chronic low back pain since 2010, treated with pain management clinic and epidural steroid injections, also intolerant to numerous neuropathic and other pain medications. Based on history and exam, I do not think patient has underlying radiation or chemotherapy induced neuropathy. I do not recommend EMG nerve conduction testing at this time.  Dx: chronic lumbar pain   PLAN: - no further neuro-diagnostic testing advised - follow up with PCP re: future referrals to pain mgmt  Return if symptoms  worsen or fail to improve, for return to PCP and Dr. Ron Agee.    Penni Bombard, MD [id number], [id number] AM Certified in Neurology, Neurophysiology and Neuroimaging  [facility] [address] [place], [place] [address] [phone]

## 2015-08-18 NOTE — Patient Instructions (Signed)
Thank you for coming to see [place] at [facility]. I hope we have been able to provide you high quality care today.  You may receive a patient satisfaction survey over the next few weeks. We would appreciate your feedback and comments so that we may continue to improve ourselves and the health of our patients.  - follow up with Dr. Redmond Pulling and pain mgmt   ~~~~~~~~~~~~~~~~~~~~~~~~~~~~~~~~~~~~~~~~~~~~~~~~~~~~~~~~~~~~~~~~~  DR. Anjalee Cope'S GUIDE TO HAPPY AND HEALTHY LIVING These are some of my general health and wellness recommendations. Some of them may apply to you better than others. Please use common sense as you try these suggestions and feel free to ask me any questions.   ACTIVITY/FITNESS Mental, social, emotional and physical stimulation are very important for brain and body health. Try learning a new activity (arts, music, language, sports, games).  Keep moving your body to the best of your abilities. You can do this at home, inside or outside, the park, community center, gym or anywhere you like. Consider a physical therapist or personal trainer to get started. Consider the app Sworkit. Fitness trackers such as smart-watches, smart-phones or Fitbits can help as well.   NUTRITION Eat more plants: colorful vegetables, nuts, seeds and berries.  Eat less sugar, salt, preservatives and processed foods.  Avoid toxins such as cigarettes and alcohol.  Drink water when you are thirsty. Warm water with a slice of lemon is an excellent morning drink to start the day.  Consider these websites for more information The Nutrition Source (https://www.henry-hernandez.biz/) [employer] Nutrition (WindowBlog.ch)   RELAXATION Consider practicing mindfulness meditation or other relaxation techniques such as deep breathing, prayer, yoga, tai chi, massage. See website mindful.org or the apps Headspace or Calm to help get  started.   SLEEP Try to get at least 7-8+ hours sleep per day. Regular exercise and reduced caffeine will help you sleep better. Practice good sleep hygeine techniques. See website sleep.org for more information.   PLANNING Prepare estate planning, living will, healthcare POA documents. Sometimes this is best planned with the help of an attorney. Theconversationproject.org and agingwithdignity.org are excellent resources.

## 2015-08-19 ENCOUNTER — Ambulatory Visit (INDEPENDENT_AMBULATORY_CARE_PROVIDER_SITE_OTHER): Payer: Medicare Other | Admitting: Psychiatry

## 2015-08-19 DIAGNOSIS — Z7189 Other specified counseling: Secondary | ICD-10-CM | POA: Diagnosis not present

## 2015-08-19 DIAGNOSIS — F4323 Adjustment disorder with mixed anxiety and depressed mood: Secondary | ICD-10-CM | POA: Diagnosis not present

## 2015-08-26 ENCOUNTER — Encounter (HOSPITAL_COMMUNITY): Payer: Self-pay | Admitting: Psychiatry

## 2015-08-26 ENCOUNTER — Ambulatory Visit (INDEPENDENT_AMBULATORY_CARE_PROVIDER_SITE_OTHER): Payer: Medicare Other | Admitting: Psychiatry

## 2015-08-26 VITALS — BP 157/97 | HR 98 | Ht 63.0 in | Wt 171.0 lb

## 2015-08-26 DIAGNOSIS — F411 Generalized anxiety disorder: Secondary | ICD-10-CM | POA: Diagnosis not present

## 2015-08-26 DIAGNOSIS — I2510 Atherosclerotic heart disease of native coronary artery without angina pectoris: Secondary | ICD-10-CM | POA: Diagnosis not present
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
## 2015-08-27 ENCOUNTER — Encounter: Payer: Self-pay | Admitting: Internal Medicine

## 2015-08-27 ENCOUNTER — Ambulatory Visit (INDEPENDENT_AMBULATORY_CARE_PROVIDER_SITE_OTHER): Payer: Medicare Other | Admitting: Internal Medicine

## 2015-08-27 VITALS — BP 160/86 | HR 80 | Temp 98.5°F | Wt 172.9 lb

## 2015-08-27 DIAGNOSIS — J209 Acute bronchitis, unspecified: Secondary | ICD-10-CM | POA: Diagnosis not present

## 2015-08-27 MED ORDER — DOXYCYCLINE HYCLATE 100 MG PO TABS: 100.0000 mg | ORAL_TABLET | Freq: Two times a day (BID) | ORAL | Status: DC

## 2015-08-27 MED ORDER — PHENYLEPHRINE-GUAIFENESIN 5-200 MG/5ML PO SYRP: 5.0000 mL | ORAL_SOLUTION | Freq: Four times a day (QID) | ORAL | Status: DC | PRN

## 2015-08-27 NOTE — Patient Instructions (Signed)
Please take doxycycline 100 mg twice daily until you run out of medication.  For your cough, please take 5 mL of the syrup every 6 hours as needed.  If no better, please come back next week.

## 2015-08-27 NOTE — Assessment & Plan Note (Signed)
Overview For the past 5 days, she complains of chest tightness associated with coughing and difficulty breathing which has worsened over the last 3 days. She reports intermittent fevers, most recently yesterday. She has not expressed much relief using Advair 1 puff twice daily and has had to go up to 4 times a day with her albuterol inhaler. She even tried using Flonase 5 times yesterday with no improvement in her congestion. She denies any sick contacts, smoking though does report overall malaise, ear pain, rhinorrhea, sinus pressure. Though she did have some loose stools 2-3 days ago, her bowel movements are back to normal.  Assessment Acute bronchitis. Absence of wheezing and other lung exam findings is reassuring for no acute asthma exacerbation. Given the duration and progression of her symptoms, it is possible she may have a bacterial infection which warrants antibiotic treatment.  Plan -Prescribed doxycycline 100 mg twice daily 10 tablets for a five-day course given the numerous antibiotic allergies documented on her chart -Advised her to take Flonase no more than 1-2 sprays per nostril daily -Advised her against from using Afrin for more than 2-3 days at a time -Suggested she try phenylephrine-guaifenesin 5-200 mg/5 mL syrup to get decongestion and cough relief -Advised her to call 911 should she develop worsening respiratory distress and wheezing

## 2015-08-27 NOTE — Progress Notes (Signed)
   Subjective:    Patient ID: [name], female    DOB: Oct 30, 1950, 64 y.o.   MRN: [id number]  HPI [name] is a 64 year old female with hypertension, coronary artery disease, GERD, depression who presents today for congestion. Please see assessment & plan for status of chronic medical problems.    Review of Systems  Constitutional: Positive for fever, chills and fatigue.  HENT: Positive for ear pain, rhinorrhea and sinus pressure.   Respiratory: Positive for cough (productive with yellow sputum), chest tightness and shortness of breath. Negative for wheezing.   Gastrointestinal: Positive for diarrhea. Negative for nausea, vomiting and abdominal pain.       Objective:   Physical Exam  Constitutional: She is oriented to person, place, and time. She appears well-developed and well-nourished. No distress.  Elderly, Caucasian female.  HENT:  Head: Normocephalic and atraumatic.  Mouth/Throat: Oropharynx is clear and moist. No oropharyngeal exudate.  Eyes: Conjunctivae and EOM are normal. Pupils are equal, round, and reactive to light. No scleral icterus.  Cardiovascular: Normal rate, regular rhythm and normal heart sounds.  Exam reveals no gallop and no friction rub.   No murmur heard. Pulmonary/Chest: Effort normal and breath sounds normal. No respiratory distress. She has no wheezes. She has no rales.  Neurological: She is alert and oriented to person, place, and time.  Skin: She is not diaphoretic.  Psychiatric: She has a normal mood and affect. Her behavior is normal.          Assessment & Plan:

## 2015-09-01 NOTE — Progress Notes (Signed)
Case discussed with Dr. Patel at the time of the visit.  We reviewed the resident's history and exam and pertinent patient test results.  I agree with the assessment, diagnosis, and plan of care documented in the resident's note. 

## 2015-09-02 ENCOUNTER — Encounter: Payer: Self-pay | Admitting: Internal Medicine

## 2015-09-02 ENCOUNTER — Ambulatory Visit (INDEPENDENT_AMBULATORY_CARE_PROVIDER_SITE_OTHER): Payer: Medicare Other | Admitting: Internal Medicine

## 2015-09-02 VITALS — BP 157/84 | HR 92 | Temp 98.5°F | Ht 63.0 in | Wt 171.3 lb

## 2015-09-02 DIAGNOSIS — J209 Acute bronchitis, unspecified: Secondary | ICD-10-CM

## 2015-09-02 DIAGNOSIS — F411 Generalized anxiety disorder: Secondary | ICD-10-CM

## 2015-09-02 NOTE — Progress Notes (Signed)
Subjective:    Patient ID: [name], female    DOB: Apr 12, 1951, 64 y.o.   MRN: [id number]  HPI Comments: [name] is a 64 year old woman with PMH as below here for follow-up of cough.  Recently treated with antibiotics, completed today.  Congestion clearing up, sore throat better.  Breathing is good.  Using rescue inhaler 2-3x per day last week.  Down to twice per day in past few day.  Febrile to 101F last Thursday.  No chills in last few days.  Appetite not the best in past few days.  Initial diarrhea with doxy which has gotten better.      Past Medical History  Diagnosis Date  . CAD (coronary artery disease)     Nonobstructive on cath 2003 and 2005  . Hypertension   . Adenocarcinoma of breast ([place]) 2009    right, s/p chemo/ xrt  . Depression   . Anal fissure   . GERD (gastroesophageal reflux disease)   . Dog bite(E906.0)   . Diverticulosis of colon (without mention of hemorrhage)   . Spinal stenosis, lumbar region, without neurogenic claudication   . Anxiety   . Panic attacks   . Asthma   . Irritable bowel syndrome   . Hiatal hernia   . Jaundice     Hx of Jaundice at age 64 from "dirty restuarant". Unsure of Hepatitis type  . Neuropathy ([place])     bilat LE's  . Chronic back pain   . Lumbar radiculopathy     bilat LE's  . Pain management   . Headache    Current Outpatient Prescriptions on File Prior to Visit  Medication Sig Dispense Refill  . acetaminophen (TYLENOL) 500 MG tablet Take 500 mg by mouth every 6 (six) hours as needed for moderate pain.    Marland Kitchen acetaminophen (TYLENOL) 650 MG CR tablet Take 650 mg by mouth every 8 (eight) hours as needed for pain.    Marland Kitchen ALPRAZolam (XANAX) 0.5 MG tablet Take 1 tablet (0.5 mg total) by mouth 2 (two) times daily as needed for anxiety. 60 tablet 1  . ascorbic acid (VITAMIN C) 1000 MG tablet Take 500 mg by mouth daily.     Marland Kitchen aspirin EC 81 MG tablet Take 81 mg by mouth daily.    . Calcium Carbonate-Vitamin D (CALCIUM-CARB 600 +  D) 600-125 MG-UNIT TABS Take 1 capsule by mouth daily.     . cetirizine (ZYRTEC) 10 MG tablet Take 1 tablet (10 mg total) by mouth daily. 30 tablet 3  . cycloSPORINE (RESTASIS) 0.05 % ophthalmic emulsion Place 1 drop into both eyes 2 (two) times daily.      . diclofenac sodium (VOLTAREN) 1 % GEL Apply topically. 1% GEL    . doxycycline (VIBRA-TABS) 100 MG tablet Take 1 tablet (100 mg total) by mouth 2 (two) times daily. 10 tablet 0  . fluticasone (FLONASE) 50 MCG/ACT nasal spray Place 1-2 sprays into both nostrils daily. 16 g 3  . Fluticasone-Salmeterol (ADVAIR) 250-50 MCG/DOSE AEPB Inhale 1 puff into the lungs 2 (two) times daily. 60 each 1  . hydrochlorothiazide (HYDRODIURIL) 12.5 MG tablet Take 1 tablet (12.5 mg total) by mouth daily. 90 tablet 1  . metoprolol (LOPRESSOR) 25 MG tablet Take 1 tablet (25 mg total) by mouth 2 (two) times daily. 60 tablet 1  . oxymetazoline (AFRIN) 0.05 % nasal spray Place 1 spray into both nostrils 2 (two) times daily as needed for congestion.    . pantoprazole (  PROTONIX) 40 MG tablet Take 1 tablet (40 mg total) by mouth daily. 90 tablet 3  . Phenylephrine-Guaifenesin 5-200 MG/5ML SYRP Take 5 mLs by mouth every 6 (six) hours as needed. 473 mL 0  . polyethylene glycol powder (GLYCOLAX/MIRALAX) powder Take 17gm mixed with water or juice daily 255 g 3  . pravastatin (PRAVACHOL) 40 MG tablet Take 1 tablet (40 mg total) by mouth every evening. 90 tablet 1  . PROAIR HFA 108 (90 BASE) MCG/ACT inhaler INHALE 2 PUFFS BY MOUTH IN TO THE LUNGS EVERY 4 HOURS AS NEEDED FOR WHEEZING OR SHORTNESS OF BREATH 8.5 Inhaler 3  . valsartan (DIOVAN) 320 MG tablet Take 1 tablet (320 mg total) by mouth daily. 90 tablet 3  . vitamin E (VITAMIN E) 400 UNIT capsule Take 400 Units by mouth daily.    . VOLTAREN 1 % GEL Apply 2 g topically 2 (two) times daily as needed (pain).      No current facility-administered medications on file prior to visit.    Review of Systems  Constitutional:  Negative for fever, chills and appetite change.  HENT: Positive for congestion, rhinorrhea and sinus pressure. Negative for ear discharge.        Symptoms improving.   Eyes: Negative for visual disturbance.  Respiratory: Positive for cough. Negative for shortness of breath and wheezing.   Cardiovascular: Negative for chest pain.       Filed Vitals:   09/02/15 1421  BP: 157/84  Pulse: 92  Temp: 98.5 F (36.9 C)  TempSrc: Oral  Height: 5\' 3"  (1.6 m)  Weight: 171 lb 4.8 oz (77.701 kg)  SpO2: 98%    Objective:   Physical Exam  Constitutional: She is oriented to person, place, and time. She appears well-developed. No distress.  HENT:  Head: Normocephalic and atraumatic.  Right Ear: External ear normal.  Left Ear: External ear normal.  Mouth/Throat: Oropharynx is clear and moist. No oropharyngeal exudate.  Sinuses mildly TTP  Eyes: Conjunctivae and EOM are normal. Pupils are equal, round, and reactive to light. Right eye exhibits no discharge. Left eye exhibits no discharge. No scleral icterus.  Neck: Normal range of motion. Neck supple.  Cardiovascular: Normal rate, regular rhythm and normal heart sounds.  Exam reveals no gallop and no friction rub.   No murmur heard. Pulmonary/Chest: Effort normal and breath sounds normal. No respiratory distress. She has no wheezes. She has no rales.  Abdominal: Soft. Bowel sounds are normal. She exhibits no distension and no mass. There is tenderness. There is no rebound and no guarding.  Mild RUQ TTP.  No rashes.  There is an old healed scar from prior open cholecystectomy.  Lymphadenopathy:    She has no cervical adenopathy.  Neurological: She is alert and oriented to person, place, and time. No cranial nerve deficit.  Skin: Skin is warm. She is not diaphoretic.  Psychiatric: She has a normal mood and affect. Her behavior is normal. Judgment and thought content normal.  Vitals reviewed.         Assessment & Plan:  Please see problem  based charting for A&P.

## 2015-09-02 NOTE — Patient Instructions (Signed)
1. It seems that your sinusitis is improving.  Please return to clinic if your symptoms get worse.  Keep the cut on your finger clean and covered with a bandaid.  Try over-the-counter antibiotic ointment on the area.  Return to clinic if finger cut becomes red or oozes.   2. Please take all medications as prescribed.    3. If you have worsening of your symptoms or new symptoms arise, please call the clinic [phone]), or go to the ER immediately if symptoms are severe.

## 2015-09-03 MED ORDER — ALPRAZOLAM 0.5 MG PO TABS: 0.5000 mg | ORAL_TABLET | Freq: Two times a day (BID) | ORAL | Status: DC | PRN

## 2015-09-03 NOTE — Assessment & Plan Note (Addendum)
Assessment:  Towards the end our visit she tells me that Dr. Adele Schilder (psychiatrist) says that I can prescribe her Xanax.  Review of his recent note reveals indicates that the patient would prefer to get Xanax through PCP and she does not want to try antianxiety or antidepressants (per note she has had unsuccessful attempts with antidepressants in the past).  The patient was willing to continue counseling and wanted to remain on Xanax.  Dr. Adele Schilder stopped TCA because the patient stopped it due to nightmares.  Per note, Dr. Adele Schilder did not plan to arrange any more office visits.  Will touch base with Dr. Adele Schilder regarding continue rx of Xanax.  If he is no longer prescribing, I will prescribe and work on slow taper since the patient has been on this med for years.  Last rx was from Dr. Adele Schilder for Xanax 0.5mg  BID #60 with 1 refill and it was sent on 06/25/15.  She tells me she has about 2 days of pills left.  [facility] database checked and ok. Plan:  Continue counseling.  Rx for Xanax 0.5mg  BID #60 (no refills) will be called in to her pharmacy.  I will contact Dr. Adele Schilder.  Will plan to check UDS at next visit in 1 month.  She will need to sign med contract next visit if I am going to continue prescribing.

## 2015-09-03 NOTE — Progress Notes (Signed)
Rx for generic Xanax called in to pharmacy per Dr Redmond Pulling message. Hilda Blades Orlo Brickle RN 09/03/15 4:30PM

## 2015-09-03 NOTE — Assessment & Plan Note (Addendum)
Assessment:  Still with rhinorrhea and cough but overall symptoms improving after 5 day abx course.  She took last pill today.  She denies dyspnea.  She is able to go about usual routine.  Lungs clear, normal SpO2, no respiratory distress.  I explained that longer course of abx not better than shorter course.  She is in agreement. Plan:  No additional abx need given improvement.  Continue flonase and asthma maintenance medications.  She will look for OTC cough syrup specifically for HTN patients since BP is slightly up with use of current OTC cough syrup.  She will return to clinic or seek emergency care with new or worsening respiratory symptoms.

## 2015-09-04 NOTE — Progress Notes (Signed)
Case discussed with Dr. Wilson soon after the resident saw the patient. We reviewed the resident's history and exam and pertinent patient test results. I agree with the assessment, diagnosis, and plan of care documented in the resident's note. 

## 2015-09-08 ENCOUNTER — Emergency Department (HOSPITAL_COMMUNITY): Payer: Medicare Other

## 2015-09-08 ENCOUNTER — Encounter (HOSPITAL_COMMUNITY): Payer: Self-pay | Admitting: Vascular Surgery

## 2015-09-08 ENCOUNTER — Emergency Department (HOSPITAL_COMMUNITY)
Admission: EM | Admit: 2015-09-08 | Discharge: 2015-09-08 | Disposition: A | Discharge status: Home / Self Care | Payer: Medicare Other | Attending: Emergency Medicine | Admitting: Emergency Medicine

## 2015-09-08 ENCOUNTER — Telehealth: Payer: Self-pay | Admitting: Internal Medicine

## 2015-09-08 ENCOUNTER — Telehealth: Payer: Self-pay

## 2015-09-08 DIAGNOSIS — M5442 Lumbago with sciatica, left side: Secondary | ICD-10-CM

## 2015-09-08 DIAGNOSIS — J45909 Unspecified asthma, uncomplicated: Secondary | ICD-10-CM | POA: Diagnosis not present

## 2015-09-08 DIAGNOSIS — Z7982 Long term (current) use of aspirin: Secondary | ICD-10-CM | POA: Insufficient documentation

## 2015-09-08 DIAGNOSIS — F329 Major depressive disorder, single episode, unspecified: Secondary | ICD-10-CM | POA: Diagnosis not present

## 2015-09-08 DIAGNOSIS — G8929 Other chronic pain: Secondary | ICD-10-CM | POA: Insufficient documentation

## 2015-09-08 DIAGNOSIS — R079 Chest pain, unspecified: Secondary | ICD-10-CM | POA: Insufficient documentation

## 2015-09-08 DIAGNOSIS — Z91040 Latex allergy status: Secondary | ICD-10-CM | POA: Insufficient documentation

## 2015-09-08 DIAGNOSIS — I2510 Atherosclerotic heart disease of native coronary artery without angina pectoris: Secondary | ICD-10-CM | POA: Insufficient documentation

## 2015-09-08 DIAGNOSIS — M79661 Pain in right lower leg: Secondary | ICD-10-CM | POA: Diagnosis not present

## 2015-09-08 DIAGNOSIS — I10 Essential (primary) hypertension: Secondary | ICD-10-CM | POA: Diagnosis not present

## 2015-09-08 DIAGNOSIS — Z880 Allergy status to penicillin: Secondary | ICD-10-CM | POA: Insufficient documentation

## 2015-09-08 DIAGNOSIS — Z87891 Personal history of nicotine dependence: Secondary | ICD-10-CM | POA: Diagnosis not present

## 2015-09-08 DIAGNOSIS — M79662 Pain in left lower leg: Secondary | ICD-10-CM | POA: Insufficient documentation

## 2015-09-08 DIAGNOSIS — Z79899 Other long term (current) drug therapy: Secondary | ICD-10-CM | POA: Diagnosis not present

## 2015-09-08 DIAGNOSIS — K219 Gastro-esophageal reflux disease without esophagitis: Secondary | ICD-10-CM | POA: Insufficient documentation

## 2015-09-08 DIAGNOSIS — M79669 Pain in unspecified lower leg: Secondary | ICD-10-CM

## 2015-09-08 DIAGNOSIS — F419 Anxiety disorder, unspecified: Secondary | ICD-10-CM | POA: Diagnosis not present

## 2015-09-08 LAB — BASIC METABOLIC PANEL
ANION GAP: 9 (ref 5–15)
BUN: 12 mg/dL (ref 6–20)
CALCIUM: 10.5 mg/dL — AB (ref 8.9–10.3)
CHLORIDE: 104 mmol/L (ref 101–111)
CO2: 30 mmol/L (ref 22–32)
CREATININE: 0.73 mg/dL (ref 0.44–1.00)
GFR calc Af Amer: >60 mL/min (ref >60)
GFR calc non Af Amer: >60 mL/min (ref >60)
GLUCOSE: 127 mg/dL — AB (ref 65–99)
Potassium: 3.4 mmol/L — ABNORMAL LOW (ref 3.5–5.1)
Sodium: 143 mmol/L (ref 135–145)

## 2015-09-08 LAB — URINALYSIS, ROUTINE W REFLEX MICROSCOPIC
BILIRUBIN URINE: NEGATIVE
Glucose, UA: NEGATIVE mg/dL
Hgb urine dipstick: NEGATIVE
Ketones, ur: NEGATIVE mg/dL
Leukocytes, UA: NEGATIVE
NITRITE: NEGATIVE
PH: 7.5 (ref 5.0–8.0)
Protein, ur: NEGATIVE mg/dL
SPECIFIC GRAVITY, URINE: 1.007 (ref 1.005–1.030)

## 2015-09-08 LAB — CBC
HEMATOCRIT: 40.5 % (ref 36.0–46.0)
HEMOGLOBIN: 13.5 g/dL (ref 12.0–15.0)
MCH: 30.5 pg (ref 26.0–34.0)
MCHC: 33.3 g/dL (ref 30.0–36.0)
MCV: 91.4 fL (ref 78.0–100.0)
Platelets: 192 10*3/uL (ref 150–400)
RBC: 4.43 MIL/uL (ref 3.87–5.11)
RDW: 13.4 % (ref 11.5–15.5)
WBC: 4.2 10*3/uL (ref 4.0–10.5)

## 2015-09-08 LAB — D-DIMER, QUANTITATIVE: D-Dimer, Quant: 0.42 ug/mL-FEU (ref 0.00–0.50)

## 2015-09-08 LAB — I-STAT TROPONIN, ED: Troponin i, poc: 0 ng/mL (ref 0.00–0.08)

## 2015-09-08 MED ORDER — DIPHENHYDRAMINE HCL 25 MG PO CAPS
25.0000 mg | ORAL_CAPSULE | Freq: Four times a day (QID) | ORAL | Status: DC | PRN
  Administered 2015-09-08: 25 mg via ORAL
  Filled 2015-09-08: qty 1

## 2015-09-08 MED ORDER — ACETAMINOPHEN-CODEINE #3 300-30 MG PO TABS: 1.0000 | ORAL_TABLET | Freq: Four times a day (QID) | ORAL | Status: DC | PRN

## 2015-09-08 MED ORDER — MORPHINE SULFATE (PF) 4 MG/ML IV SOLN
4.0000 mg | Freq: Once | INTRAVENOUS | Status: AC
  Administered 2015-09-08: 4 mg via INTRAMUSCULAR
  Filled 2015-09-08: qty 1

## 2015-09-08 MED ORDER — METHYLPREDNISOLONE 4 MG PO TBPK: ORAL_TABLET | ORAL | Status: DC

## 2015-09-08 MED ORDER — MORPHINE SULFATE (PF) 4 MG/ML IV SOLN
4.0000 mg | Freq: Once | INTRAVENOUS | Status: DC
  Filled 2015-09-08: qty 1

## 2015-09-08 NOTE — ED Notes (Signed)
Called main lab in regards to lab add on. D-dimer to be added, but troponin needs to be drawn as original has timed out.

## 2015-09-08 NOTE — Discharge Instructions (Signed)
Please read and follow all provided instructions.  Your diagnoses today include:  1. Bilateral low back pain with left-sided sciatica   2. Calf tenderness   3. Essential hypertension     Tests performed today include:  Vital signs - see below for your results today  Medications prescribed:   Take any prescribed medications only as directed.  Home care instructions:   Follow any educational materials contained in this packet  Please rest, use ice or heat on your back for the next several days  Do not lift, push, pull anything more than 10 pounds for the next week  Follow-up instructions: Please follow-up with your OB/GYN for management of the incontinence symptoms as discussed.   Return instructions:  SEEK IMMEDIATE MEDICAL ATTENTION IF YOU HAVE:  New numbness, tingling, weakness, or problem with the use of your arms or legs  Severe back pain not relieved with medications  Loss control of your bowels or bladder  Increasing pain in any areas of the body (such as chest or abdominal pain)  Shortness of breath, dizziness, or fainting.   Worsening nausea (feeling sick to your stomach), vomiting, fever, or sweats  Any other emergent concerns regarding your health   Additional Information:  Your vital signs today were: BP 152/90 mmHg   Pulse 72   Temp(Src) 98.7 F (37.1 C) (Oral)   Resp 11   SpO2 95% If your blood pressure (BP) was elevated above 135/85 this visit, please have this repeated by your doctor within one month. --------------

## 2015-09-08 NOTE — ED Notes (Signed)
Pt reports to the ED for eval of CP, HTN, and left leg pain. Pt reports she was walking her dog last night when the pain started and she took her inhaler and went to bed. Then today she was visiting her mother and she developed some lightheadedness and they took her BP and noticed it was elevated. Pt reports the CP started last night and has continued into today. Pt describes it as a sharp pain and localizes it to the middle of her chest. Pt A&Ox4, resp e/u, and skin warm and dry,

## 2015-09-08 NOTE — ED Provider Notes (Signed)
The patient is a 64 year old female, she complains of bilateral leg pain after walking her dog last night, states that his thigh pain that radiates up into her lower back, worse with trying to walk or lift her legs, she has no neuro deficits including weakness or sensation, she is able to move around on the bed with minimal difficulty, she can straight leg raise with some pain but no weakness. She has no reproducible tenderness over her back. She also states that she's been having some urinary incontinence for months, she has seen her OB/GYN, she is supposed to see them again this week.  Heart and lungs normal, she is not having chest pain or shortness of breath at this time, labs reviewed, x-ray reviewed, the patient appears stable for discharge, doubt DVT, likely musculoskeletal, could be radiculopathy or spinal cord issue though at this time the patient has normal neurologic exam. She'll be discharged after receiving a dose of pain medicine to be discharged with steroid and Tylenol 3 which the patient states is safe for her. She reports that she can take Tylenol with Codeine. We'll give her red flags for follow-up, she may need an MRI of her symptoms don't improve or if she worsens. She has expressed her understanding to me regarding the follow-up instructions  Medical screening examination/treatment/procedure(s) were conducted as a shared visit with non-physician practitioner(s) and myself.  I personally evaluated the patient during the encounter.  Clinical Impression:   Final diagnoses:  Calf tenderness  Essential hypertension  Bilateral low back pain with left-sided sciatica           Noemi Chapel, MD 09/12/15 2202

## 2015-09-08 NOTE — Telephone Encounter (Signed)
Please call pt back regarding bp is high.

## 2015-09-08 NOTE — Telephone Encounter (Signed)
I reviewed her medications and she should be fine resuming today, once per day.  She is on low dose hctz and metoprolol.  She is on moderate dose valsartan, but if she had no issues or symptoms yesterday, she should be fine resuming today.  Discussed with Leigh.

## 2015-09-08 NOTE — ED Notes (Signed)
Provider at the bedside.  

## 2015-09-08 NOTE — Telephone Encounter (Signed)
Pt called reporting she accidentally took all her her morning medications twice yesterday (aspirin, calcium carbonate, zyrtec, HCTZ, metoprolol, protonix, valsartan).  Pt questioning whether or not to resume her medicine schedule for today.  Pt denies any symptoms of hypotension, no dizziness, no lightheadedness, or fainting.  She reports going to [facility] to pick up her husbands medicine and checking her BP before she realized her mistake and BP read 144/77.  She states "she said to herself Dr. Redmond Pulling would be proud."  Pt says although she felt fine yesterday, today she feels nervous taking her medicine.  I educated her on the half-life of medicine to help lower her anxiety about the situation.  She is going to go to [facility] and check her BP this morning.    Is she OK to resume medicine as scheduled or is there something she should hold today?

## 2015-09-08 NOTE — Telephone Encounter (Signed)
Spoke with pt who reports being at her mothers nursing home, feeling "light-headed, dizzy, and weak."  She reports having the nurse there take her BP and it being 176/98 and then 184/115.  I had the patient have BP measured again on the phone with me.  Reading of 185/113 obtained.  Pt new complaint of "pain to my left leg and foot and my chest feels real tight."  Advised pt that she should be evaluated by a physician emergently if she is feeling chest pain.  Pt agrees and will report to ED.

## 2015-09-08 NOTE — ED Provider Notes (Signed)
CSN: [id number]     Arrival date & time 09/08/15  1655 History   First MD Initiated Contact with Patient 09/08/15 2051     Chief Complaint  Patient presents with  . Chest Pain  . Hypertension   (Consider location/radiation/quality/duration/timing/severity/associated sxs/prior Treatment) HPI 64 y.o. female with a hx of CAD w/ non obstructive cath 2003 and 2005, HTN, Asthma and breast cancer, presents to the Emergency Department today complaining of chest tightness and left leg pain. Last night she noticed the pain in her left leg when she was out walking her dog. She describes the pain as sharp that shot up from her foot to her left hip. When asked further, she does endorse pain in her right leg as well, but with the left leg being worse than the other. Bilateral leg pain seems to radiate towards her lower back with a worsening of symptoms with ambulation. She does not endorse any loss of bowel function, but does report incontinence over the past few months. When asked further, the patient describes an urge incontinence in that she knows she has to urinate, but cannot make it to the bathroom in time and urinates on herself. She does have an appointment with her OB/GYN this week for management of the incontinence. Also during the initial evening, she developed shortness of breath with some chest tightness. No syncope. She tried some Proair with minimal relief. Took a Xanax so she could go to sleep. She is reporting headache, blurry vision, dizziness, weakness, and some nausea. No cardiac stress test has been done in the past.   Past Medical History  Diagnosis Date  . CAD (coronary artery disease)     Nonobstructive on cath 2003 and 2005  . Hypertension   . Adenocarcinoma of breast ([place]) 2009    right, s/p chemo/ xrt  . Depression   . Anal fissure   . GERD (gastroesophageal reflux disease)   . Dog bite(E906.0)   . Diverticulosis of colon (without mention of hemorrhage)   . Spinal stenosis,  lumbar region, without neurogenic claudication   . Anxiety   . Panic attacks   . Asthma   . Irritable bowel syndrome   . Hiatal hernia   . Jaundice     Hx of Jaundice at age 27 from "dirty restuarant". Unsure of Hepatitis type  . Neuropathy ([place])     bilat LE's  . Chronic back pain   . Lumbar radiculopathy     bilat LE's  . Pain management   . Headache    Past Surgical History  Procedure Laterality Date  . Partial hysterectomy  1987  . Cardiac catheterization  2003, 2005  . Cholecystectomy    . Bladder repair      tact  . Breast lumpectomy Right   . Breast reconstruction Right   . Breast reduction surgery Left   . Colonoscopy  2013    Diverticulosis  . Cataract extraction    . Esophageal manometry  10/08/2012    Procedure: ESOPHAGEAL MANOMETRY (EM);  Surgeon: Sable Feil, MD;  Location: [facility];  Service: Endoscopy;  Laterality: N/A;  . Esophagogastroduodenoscopy  2014    Normal    Family History  Problem Relation Age of Onset  . Hypertension Mother   . Heart disease Mother   . Dementia Mother   . Arthritis Mother   . Diabetes Mother   . Colon polyps Mother   . Prostate cancer Father   . Hypertension Father   . Colon  polyps Father   . Lung cancer Maternal Uncle   . Hypertension Sister   . Hypertension Brother   . Heart disease Sister   . Rectal cancer Neg Hx   . Stomach cancer Neg Hx   . Colon cancer Cousin   . Inflammatory bowel disease Sister    Social History  Substance Use Topics  . Smoking status: Former Smoker -- 0.30 packs/day for 8 years    Types: Cigarettes    Quit date: 09/13/1983  . Smokeless tobacco: Never Used  . Alcohol Use: No   OB History    No data available     Review of Systems  Constitutional: Negative for fever, chills, diaphoresis and fatigue.  HENT: Negative for congestion, sinus pressure, sore throat and tinnitus.   Eyes: Positive for visual disturbance.  Respiratory: Positive for chest tightness and shortness of  breath. Negative for cough.   Cardiovascular: Negative for chest pain.  Gastrointestinal: Positive for nausea. Negative for vomiting, abdominal pain, diarrhea and constipation.  Endocrine: Negative for cold intolerance and heat intolerance.  Genitourinary: Negative for hematuria, flank pain, vaginal bleeding and vaginal discharge.  Musculoskeletal: Negative for back pain.  Skin: Negative for color change.  Neurological: Positive for dizziness and headaches. Negative for seizures, syncope, weakness and numbness.  All other systems reviewed and are negative.  Allergies  Codeine; Adhesive; Amoxicillin; Azithromycin; Bromfed; Cephalexin; Chlordiazepoxide-clidinium; Clotrimazole; Dicyclomine hcl; Effexor; Gabapentin; Gatifloxacin; Hydralazine hcl; Ibuprofen; Iohexol; Latex; Lidocaine; Lisinopril; Librax ; Paroxetine; Penicillins; Prednisone; Pregabalin; Propoxyphene n-acetaminophen; Sertraline; Sertraline hcl; Sulfa antibiotics; Sulfadiazine; Tussionex pennkinetic er; and Verapamil  Home Medications   Prior to Admission medications   Medication Sig Start Date End Date Taking? Authorizing Provider  acetaminophen (TYLENOL) 500 MG tablet Take 500 mg by mouth every 6 (six) hours as needed for moderate pain.    Historical Provider, MD  ALPRAZolam Duanne Moron) 0.5 MG tablet Take 1 tablet (0.5 mg total) by mouth 2 (two) times daily as needed for anxiety. 09/03/15   Francesca Oman, DO  ascorbic acid (VITAMIN C) 1000 MG tablet Take 500 mg by mouth daily.     Historical Provider, MD  aspirin EC 81 MG tablet Take 81 mg by mouth daily.    Historical Provider, MD  Calcium Carbonate-Vitamin D (CALCIUM-CARB 600 + D) 600-125 MG-UNIT TABS Take 1 capsule by mouth daily.     Historical Provider, MD  cetirizine (ZYRTEC) 10 MG tablet Take 1 tablet (10 mg total) by mouth daily. 02/05/15   Tresa Garter, MD  cycloSPORINE (RESTASIS) 0.05 % ophthalmic emulsion Place 1 drop into both eyes 2 (two) times daily.      Historical  Provider, MD  diclofenac sodium (VOLTAREN) 1 % GEL Apply topically. 1% GEL 07/06/15   Historical Provider, MD  fluticasone (FLONASE) 50 MCG/ACT nasal spray Place 1-2 sprays into both nostrils daily. 07/29/15   Francesca Oman, DO  Fluticasone-Salmeterol (ADVAIR) 250-50 MCG/DOSE AEPB Inhale 1 puff into the lungs 2 (two) times daily. 06/17/15   Francesca Oman, DO  hydrochlorothiazide (HYDRODIURIL) 12.5 MG tablet Take 1 tablet (12.5 mg total) by mouth daily. 04/16/15   Francesca Oman, DO  metoprolol (LOPRESSOR) 25 MG tablet Take 1 tablet (25 mg total) by mouth 2 (two) times daily. 04/16/15   Francesca Oman, DO  oxymetazoline (AFRIN) 0.05 % nasal spray Place 1 spray into both nostrils 2 (two) times daily as needed for congestion.    Historical Provider, MD  pantoprazole (PROTONIX) 40 MG tablet Take 1 tablet (  40 mg total) by mouth daily. 01/05/15   Tresa Garter, MD  Phenylephrine-Guaifenesin 5-200 MG/5ML SYRP Take 5 mLs by mouth every 6 (six) hours as needed. 08/27/15   Rushil Sherrye Payor, MD  polyethylene glycol powder (GLYCOLAX/MIRALAX) powder Take 17gm mixed with water or juice daily 02/16/15   Jerene Bears, MD  pravastatin (PRAVACHOL) 40 MG tablet Take 1 tablet (40 mg total) by mouth every evening. 07/29/15   Francesca Oman, DO  PROAIR HFA 108 (90 BASE) MCG/ACT inhaler INHALE 2 PUFFS BY MOUTH IN TO THE LUNGS EVERY 4 HOURS AS NEEDED FOR WHEEZING OR SHORTNESS OF BREATH 06/22/15   Francesca Oman, DO  valsartan (DIOVAN) 320 MG tablet Take 1 tablet (320 mg total) by mouth daily. 12/08/14   Tresa Garter, MD  vitamin E (VITAMIN E) 400 UNIT capsule Take 400 Units by mouth daily.    Historical Provider, MD   BP 181/99 mmHg  Pulse 70  Temp(Src) 98.7 F (37.1 C) (Oral)  Resp 18  SpO2 100%   Physical Exam  Constitutional: She is oriented to person, place, and time. She appears well-developed and well-nourished.  HENT:  Head: Normocephalic and atraumatic.  Nose: Nose normal.  Mouth/Throat: Uvula is midline,  oropharynx is clear and moist and mucous membranes are normal.  Neck: Trachea normal. Neck supple.  Cardiovascular: Normal rate, regular rhythm, S1 normal, S2 normal, normal heart sounds, intact distal pulses and normal pulses.   Pulmonary/Chest: Effort normal and breath sounds normal. No accessory muscle usage. No respiratory distress.  Abdominal: Soft. Normal appearance and bowel sounds are normal. There is no tenderness. There is no rebound, no CVA tenderness, no tenderness at McBurney's point and negative Murphy's sign.  Musculoskeletal:       Right hip: She exhibits decreased range of motion. She exhibits normal strength, no tenderness and no deformity.       Left hip: She exhibits decreased range of motion. She exhibits normal strength, no tenderness and no deformity.       Lumbar back: She exhibits no tenderness, no bony tenderness and no deformity.       Right lower leg: Normal.       Left lower leg: She exhibits tenderness. She exhibits no swelling.  Lymphadenopathy:    She has no cervical adenopathy.  Neurological: She is alert and oriented to person, place, and time. She has normal strength. No cranial nerve deficit or sensory deficit.  Skin: Skin is warm and dry.  Psychiatric: She has a normal mood and affect. Her speech is normal and behavior is normal. Thought content normal.    ED Course  Procedures (including critical care time) Labs Review Labs Reviewed  BASIC METABOLIC PANEL - Abnormal; Notable for the following:    Potassium 3.4 (*)    Glucose, Bld 127 (*)    Calcium 10.5 (*)    All other components within normal limits  CBC  D-DIMER, QUANTITATIVE (NOT AT [facility])  URINALYSIS, ROUTINE W REFLEX MICROSCOPIC (NOT AT [facility])  Randolm Idol, ED   Imaging Review Dg Chest 2 View  09/08/2015  CLINICAL DATA:  Chest pain since last night; htn; EXAM: CHEST  2 VIEW COMPARISON:  08/02/2014 FINDINGS: Right upper quadrant surgical clips. Midline trachea. Normal heart size and  mediastinal contours. No pleural effusion or pneumothorax. Tiny calcified granuloma suspected in the lateral right lower lobe. IMPRESSION: No acute cardiopulmonary disease. Electronically Signed   By: Abigail Miyamoto M.D.   On: 09/08/2015 19:13  I have personally reviewed and evaluated these images and lab results as part of my medical decision-making.   EKG Interpretation   Date/Time:  Tuesday September 08 2015 17:06:47 EST Ventricular Rate:  74 PR Interval:  174 QRS Duration: 74 QT Interval:  374 QTC Calculation: 415 R Axis:   71 Text Interpretation:  Normal sinus rhythm Normal ECG Since last tracing  rate slower Confirmed by MILLER  MD, BRIAN ([id number]) on 09/08/2015 9:30:59  PM      MDM  I have reviewed relevant laboratory values. I have reviewed relevant imaging studies. I have reviewed the relevant EKG. I have reviewed the relevant previous healthcare records. I obtained HPI from historian. Cases discussed with Attending Physician  ED Course:  Assessment: 68y F presents with left leg pain and shortness of breath. Will do workup for DVT and ACS due to symptoms. EKG neg. Trop neg. D Dimer neg. Pt also complained of dysuria as well as urinary incontinence. UA was neg. Currently not having chest pain or shortness of breath at this time. She is in NAD. VS Stable. Likely musculoskeletal cause or radiculopathy in lumbar spine. CN exam unremarkable with motor and sensory intact. Will discharge patient with steroids and Tylenol 3 for symptom relief. Patient confirms that she had taken steroids and tylenol 3 in the past. Denies any allergies to those medication that are reported in her chart. Will give red flags for follow up in discharge instructions as well as follow up to OB/GYN for incontinence symptoms as she originally scheduled. Pt expressed understanding of instructions. Able to ambulate and tolerate PO.      PERC Rule for Pulmonary Embolism  1. Age greater than 103 years old (No = 0, Yes  = 1) 1 2. Heart rate greater than 100 bpm (No = 0, Yes = 1) 0 3. Oxygen saturation on room air less than 95% (No = 0, Yes = 1) 0 4. Prior history of DVT or PE (No = 0, Yes = 1) 0 5. Recent trauma or surgery within 4 weeks (No = 0, Yes = 1) 0 6. Hemoptysis (No = 0, Yes = 1) 0 7. Exogenous estrogen (No = 0, Yes = 1) 0 8. Clinical signs suggesting DVT (No = 0, Yes = 1) 1   Score- 2  HEART Score for Major Cardiac Events in the next 6 weeks  History Highly Suspicious (+2) Moderately Suspicious (+1) Slightly Suspicious (0) 0  EKG Significant ST- Depression (+2) Non-specific repolarization disturbance (+1) Normal (0) 0  Age >65 (+2) 45-65 (+1) <45 (0) 1  Risk Factors* >3 Risk Factors or Hx Atherosclerotic Disease (+2) 1-2 Risk Factors (+1) No Risk Factors  Known (0) 1  Troponin >3 x normal limit (+2)_ 1-3x normal limit (+1) < normal limit (0) 0  *Risk Factors: Hypercholesterolemia, Hypertension, Diabetes Mellitus, Cigarette Smoking, Positive Family History, Obesity    Total Score: 2  Low: 0-3 Points Moderate : 4-6 Points High: 7-10 Points  Disposition/Plan:  D/C home  Additional Verbal discharge instructions given and discussed with patient.  Pt Instructed to f/u with OB/GYN at scheduled appointment and told to return to ED if any red flag symptoms arise. Cautioned on use/responsibility of narcotic medications and to not drive/operate an machinery on medication.    Patient was discussed with Noemi Chapel, MD   Final diagnoses:  Calf tenderness  Essential hypertension  Bilateral low back pain with left-sided sciatica       Shary Decamp, PA-C 09/09/15 [place]  Sabra Heck, MD 09/12/15 2202

## 2015-09-29 DIAGNOSIS — G894 Chronic pain syndrome: Secondary | ICD-10-CM | POA: Diagnosis not present

## 2015-09-29 DIAGNOSIS — M5417 Radiculopathy, lumbosacral region: Secondary | ICD-10-CM | POA: Diagnosis not present

## 2015-09-30 ENCOUNTER — Encounter: Payer: Self-pay | Admitting: Internal Medicine

## 2015-09-30 ENCOUNTER — Ambulatory Visit (INDEPENDENT_AMBULATORY_CARE_PROVIDER_SITE_OTHER): Payer: Medicare Other | Admitting: Internal Medicine

## 2015-09-30 VITALS — BP 134/70 | HR 75 | Temp 98.1°F | Ht 63.0 in | Wt 172.7 lb

## 2015-09-30 DIAGNOSIS — F411 Generalized anxiety disorder: Secondary | ICD-10-CM

## 2015-09-30 DIAGNOSIS — Z79899 Other long term (current) drug therapy: Secondary | ICD-10-CM | POA: Diagnosis not present

## 2015-09-30 DIAGNOSIS — J322 Chronic ethmoidal sinusitis: Secondary | ICD-10-CM

## 2015-09-30 DIAGNOSIS — J45909 Unspecified asthma, uncomplicated: Secondary | ICD-10-CM

## 2015-09-30 DIAGNOSIS — Z7982 Long term (current) use of aspirin: Secondary | ICD-10-CM

## 2015-09-30 DIAGNOSIS — I10 Essential (primary) hypertension: Secondary | ICD-10-CM

## 2015-09-30 MED ORDER — FLUTICASONE-SALMETEROL 250-50 MCG/DOSE IN AEPB: 1.0000 | INHALATION_SPRAY | Freq: Two times a day (BID) | RESPIRATORY_TRACT | Status: DC

## 2015-09-30 MED ORDER — ALPRAZOLAM 0.5 MG PO TABS: 0.5000 mg | ORAL_TABLET | Freq: Two times a day (BID) | ORAL | Status: DC | PRN
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
## 2015-09-30 NOTE — Patient Instructions (Signed)
1. Please return to see me in June for follow-up.  Come back sooner if you have problems.  2. Please take all medications as prescribed.    3. If you have worsening of your symptoms or new symptoms arise, please call the clinic [phone]), or go to the ER immediately if symptoms are severe.

## 2015-09-30 NOTE — Assessment & Plan Note (Addendum)
Assessment:  Generalized anxiety with some episodes of panic.  She is going to therapy every 2 weeks and finds this helpful.  She was seen by psych but declines addition of SSRI/SNRI 2/2 to allergy hx.   Plan:  Continue Xanax 0.5mg  BID prn for now.  3 paper rx for Xanax 0.5mg  BID prn #60 were provided (one for fill now, one to fill on or after 10/31/15 and one to fill on or after 11/28/15).  Checked [place] database and all ok, no red flags.  Check UDS today (she last took Xanax 40 min before today's visit).  She signed controlled substance contract today.  Work on decreasing further and discuss initiation of long term anxiety med, such as Buspar, in the future.  Continue therapy q2 weeks.  She will return to see me in June 2017 (the next time I am in clinic) or to see my colleagues if problems arise before June.

## 2015-10-01 MED ORDER — HYDROCHLOROTHIAZIDE 12.5 MG PO TABS: 12.5000 mg | ORAL_TABLET | Freq: Every day | ORAL | Status: DC

## 2015-10-01 MED ORDER — CETIRIZINE HCL 10 MG PO TABS: 10.0000 mg | ORAL_TABLET | Freq: Every day | ORAL | Status: DC

## 2015-10-01 MED ORDER — METOPROLOL TARTRATE 25 MG PO TABS: 25.0000 mg | ORAL_TABLET | Freq: Two times a day (BID) | ORAL | Status: DC

## 2015-10-01 NOTE — Assessment & Plan Note (Signed)
BP Readings from Last 3 Encounters:  09/30/15 134/70  09/08/15 133/82  09/02/15 157/84    Lab Results  Component Value Date   NA 143 09/08/2015   K 3.4* 09/08/2015   CREATININE 0.73 09/08/2015    Assessment: Blood pressure control:  well controlled Progress toward BP goal:   at goal Comments: Compliant with therapy.  No ADRs.  Plan: Medications:  continue current medications:  HCTZ 12.5mg  daily, Diovan 320mg  daily, metoprolol 25mg  BID Educational resources provided:   Self management tools provided:   Other plans: follow-up BP next visit.  BMP next visit.

## 2015-10-01 NOTE — Progress Notes (Signed)
Case discussed with Dr. Wilson soon after the resident saw the patient. We reviewed the resident's history and exam and pertinent patient test results. I agree with the assessment, diagnosis, and plan of care documented in the resident's note. 

## 2015-10-06 LAB — TOXASSURE SELECT,+ANTIDEPR,UR: PDF: 0

## 2015-10-27 ENCOUNTER — Ambulatory Visit (INDEPENDENT_AMBULATORY_CARE_PROVIDER_SITE_OTHER): Payer: Medicare Other | Admitting: Psychiatry

## 2015-10-27 DIAGNOSIS — Z7189 Other specified counseling: Secondary | ICD-10-CM

## 2015-10-27 DIAGNOSIS — F4323 Adjustment disorder with mixed anxiety and depressed mood: Secondary | ICD-10-CM | POA: Diagnosis not present

## 2015-11-03 DIAGNOSIS — M5417 Radiculopathy, lumbosacral region: Secondary | ICD-10-CM | POA: Diagnosis not present

## 2015-11-03 DIAGNOSIS — Z888 Allergy status to other drugs, medicaments and biological substances status: Secondary | ICD-10-CM | POA: Diagnosis not present

## 2015-11-03 DIAGNOSIS — Z87891 Personal history of nicotine dependence: Secondary | ICD-10-CM | POA: Diagnosis not present

## 2015-11-03 DIAGNOSIS — Z884 Allergy status to anesthetic agent status: Secondary | ICD-10-CM | POA: Diagnosis not present

## 2015-11-03 DIAGNOSIS — I10 Essential (primary) hypertension: Secondary | ICD-10-CM | POA: Diagnosis not present

## 2015-11-03 DIAGNOSIS — Z885 Allergy status to narcotic agent status: Secondary | ICD-10-CM | POA: Diagnosis not present

## 2015-11-03 DIAGNOSIS — Z881 Allergy status to other antibiotic agents status: Secondary | ICD-10-CM | POA: Diagnosis not present

## 2015-11-03 DIAGNOSIS — E7800 Pure hypercholesterolemia, unspecified: Secondary | ICD-10-CM | POA: Diagnosis not present

## 2015-11-03 DIAGNOSIS — G894 Chronic pain syndrome: Secondary | ICD-10-CM | POA: Diagnosis not present

## 2015-11-03 DIAGNOSIS — Z882 Allergy status to sulfonamides status: Secondary | ICD-10-CM | POA: Diagnosis not present

## 2015-11-03 DIAGNOSIS — Z91048 Other nonmedicinal substance allergy status: Secondary | ICD-10-CM | POA: Diagnosis not present

## 2015-11-03 DIAGNOSIS — I252 Old myocardial infarction: Secondary | ICD-10-CM | POA: Diagnosis not present

## 2015-11-03 DIAGNOSIS — Z880 Allergy status to penicillin: Secondary | ICD-10-CM | POA: Diagnosis not present

## 2015-11-10 ENCOUNTER — Ambulatory Visit (INDEPENDENT_AMBULATORY_CARE_PROVIDER_SITE_OTHER): Payer: Medicare Other | Admitting: Psychiatry

## 2015-11-10 DIAGNOSIS — F4323 Adjustment disorder with mixed anxiety and depressed mood: Secondary | ICD-10-CM | POA: Diagnosis not present

## 2015-11-10 DIAGNOSIS — Z7189 Other specified counseling: Secondary | ICD-10-CM | POA: Diagnosis not present

## 2015-11-12 ENCOUNTER — Encounter: Payer: Self-pay | Admitting: Internal Medicine

## 2015-11-12 ENCOUNTER — Ambulatory Visit (INDEPENDENT_AMBULATORY_CARE_PROVIDER_SITE_OTHER): Payer: Medicare Other | Admitting: Internal Medicine

## 2015-11-12 VITALS — BP 140/77 | HR 72 | Temp 98.4°F | Wt 172.9 lb

## 2015-11-12 DIAGNOSIS — J3489 Other specified disorders of nose and nasal sinuses: Secondary | ICD-10-CM | POA: Diagnosis not present

## 2015-11-12 DIAGNOSIS — R197 Diarrhea, unspecified: Secondary | ICD-10-CM | POA: Diagnosis not present

## 2015-11-12 DIAGNOSIS — J069 Acute upper respiratory infection, unspecified: Secondary | ICD-10-CM

## 2015-11-12 DIAGNOSIS — J029 Acute pharyngitis, unspecified: Secondary | ICD-10-CM

## 2015-11-12 DIAGNOSIS — R05 Cough: Secondary | ICD-10-CM

## 2015-11-12 DIAGNOSIS — H9202 Otalgia, left ear: Secondary | ICD-10-CM | POA: Diagnosis not present

## 2015-11-12 NOTE — Progress Notes (Signed)
   Subjective:    Patient ID: [name], female    DOB: 07/03/1951, 65 y.o.   MRN: [id number]  HPI  65 yo female with hx of HTN, asthma, chronic sinusitis, GERd, anxiety, HLD, depression presents with  Complaint of sore throat, ear pain, mild cough, sinus pressure, and diarrhea since last Wednesday/thursday.  She is already feeling better, has been doing salt water gargles, using her flonase nasal spray. Denies any fever/chills. She is concerned because she is going to [place] next Wednesday. Denies any dysuria. Diarrhea was nonbloody/no mucous. No cough. Had some SOB, improved with her PRN albuterol.   Review of Systems  Constitutional: Negative for fever, chills and fatigue.  HENT: Positive for congestion, ear pain, postnasal drip, sinus pressure and sore throat. Negative for facial swelling, hearing loss, mouth sores, trouble swallowing and voice change.   Respiratory: Positive for cough and shortness of breath. Negative for chest tightness and wheezing.   Cardiovascular: Negative for chest pain, palpitations and leg swelling.  Gastrointestinal: Positive for nausea and diarrhea. Negative for vomiting, abdominal pain and abdominal distention.  Genitourinary: Negative for dysuria.  Musculoskeletal: Negative.   Skin: Negative.   Allergic/Immunologic: Negative.   Neurological: Negative.   Hematological: Negative.   Psychiatric/Behavioral: Negative.        Objective:   Physical Exam  Constitutional: She is oriented to person, place, and time.  HENT:  Head: Normocephalic and atraumatic.  Perforated TM on left ear (chronic).   No exudate or erythema on orypharynx.  Mild Tenderness on throat.   Eyes: Conjunctivae are normal. Pupils are equal, round, and reactive to light.  Neck: Normal range of motion. No JVD present.  Cardiovascular: Normal rate and regular rhythm.  Exam reveals no friction rub.   No murmur heard. Pulmonary/Chest: Effort normal and breath sounds normal. No  respiratory distress. She has no wheezes. She has no rales.  Abdominal: Soft. Bowel sounds are normal. She exhibits no distension. There is no tenderness.  Lymphadenopathy:    She has no cervical adenopathy.  Neurological: She is alert and oriented to person, place, and time.    Filed Vitals:   11/12/15 1437  BP: 140/77  Pulse: 72  Temp: 98.4 F (36.9 C)         Assessment & Plan:  See problem based a&p.

## 2015-11-12 NOTE — Patient Instructions (Signed)
You have a viral infection. It doesn't need antibiotics.  Keep doing the conservative things we discussed: humidifier, salt water gargles, tylenol, nasal spray.  If you don't improve by next Monday, give [place] a call and come back to see [place].

## 2015-11-12 NOTE — Assessment & Plan Note (Signed)
Had sore throat, mild cough, diarrhea, some sinus pressure, ear pain. They are improving. Likely 2/2 to viral infection.  Will do supportive care.  Asked to come back next week if she is not improving. May consider abx that time. Now has no indication for abx.

## 2015-11-13 NOTE — Progress Notes (Signed)
Internal Medicine Clinic Attending  Case discussed with Dr. Ahmed at the time of the visit.  We reviewed the resident's history and exam and pertinent patient test results.  I agree with the assessment, diagnosis, and plan of care documented in the resident's note. 

## 2015-11-16 ENCOUNTER — Ambulatory Visit: Payer: Self-pay | Admitting: Internal Medicine

## 2015-12-08 ENCOUNTER — Ambulatory Visit (INDEPENDENT_AMBULATORY_CARE_PROVIDER_SITE_OTHER): Payer: Medicare Other | Admitting: Psychiatry

## 2015-12-08 DIAGNOSIS — F4323 Adjustment disorder with mixed anxiety and depressed mood: Secondary | ICD-10-CM | POA: Diagnosis not present

## 2015-12-08 DIAGNOSIS — Z7189 Other specified counseling: Secondary | ICD-10-CM

## 2015-12-15 ENCOUNTER — Ambulatory Visit: Payer: Federal, State, Local not specified - Other | Admitting: Psychiatry

## 2015-12-22 ENCOUNTER — Ambulatory Visit (INDEPENDENT_AMBULATORY_CARE_PROVIDER_SITE_OTHER): Payer: Medicare Other | Admitting: Psychiatry

## 2015-12-22 DIAGNOSIS — F4323 Adjustment disorder with mixed anxiety and depressed mood: Secondary | ICD-10-CM

## 2015-12-22 DIAGNOSIS — Z7189 Other specified counseling: Secondary | ICD-10-CM | POA: Diagnosis not present

## 2015-12-28 ENCOUNTER — Other Ambulatory Visit: Payer: Self-pay | Admitting: Internal Medicine

## 2015-12-30 DIAGNOSIS — M5417 Radiculopathy, lumbosacral region: Secondary | ICD-10-CM | POA: Diagnosis not present

## 2015-12-30 DIAGNOSIS — M5136 Other intervertebral disc degeneration, lumbar region: Secondary | ICD-10-CM | POA: Diagnosis not present

## 2015-12-30 DIAGNOSIS — G894 Chronic pain syndrome: Secondary | ICD-10-CM | POA: Diagnosis not present

## 2015-12-31 NOTE — Telephone Encounter (Signed)
Phoned into pharmacy-per MD request, drug database checked prior to calling in-no red flags noted.  Phone call complete.Despina Hidden Cassady4/20/201712:06 PM

## 2016-01-05 ENCOUNTER — Ambulatory Visit: Payer: Federal, State, Local not specified - Other | Admitting: Psychiatry

## 2016-01-13 ENCOUNTER — Ambulatory Visit (INDEPENDENT_AMBULATORY_CARE_PROVIDER_SITE_OTHER): Payer: Medicare Other | Admitting: Internal Medicine

## 2016-01-13 ENCOUNTER — Encounter: Payer: Self-pay | Admitting: Internal Medicine

## 2016-01-13 VITALS — BP 155/83 | HR 91 | Temp 98.5°F | Wt 166.3 lb

## 2016-01-13 DIAGNOSIS — T7491XA Unspecified adult maltreatment, confirmed, initial encounter: Secondary | ICD-10-CM | POA: Diagnosis not present

## 2016-01-13 DIAGNOSIS — IMO0001 Reserved for inherently not codable concepts without codable children: Secondary | ICD-10-CM

## 2016-01-13 DIAGNOSIS — F411 Generalized anxiety disorder: Secondary | ICD-10-CM | POA: Diagnosis not present

## 2016-01-13 HISTORY — DX: Unspecified adult maltreatment, confirmed, initial encounter: T74.91XA

## 2016-01-13 MED ORDER — ALPRAZOLAM 0.5 MG PO TABS: 0.5000 mg | ORAL_TABLET | Freq: Three times a day (TID) | ORAL | Status: DC | PRN
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
## 2016-01-13 NOTE — Progress Notes (Signed)
Internal Medicine Clinic Attending  Case discussed with Dr. Rivet at the time of the visit.  We reviewed the resident's history and exam and pertinent patient test results.  I agree with the assessment, diagnosis, and plan of care documented in the resident's note.  

## 2016-01-25 ENCOUNTER — Other Ambulatory Visit: Payer: Self-pay

## 2016-01-25 ENCOUNTER — Other Ambulatory Visit: Payer: Self-pay | Admitting: Internal Medicine

## 2016-01-25 DIAGNOSIS — Z1231 Encounter for screening mammogram for malignant neoplasm of breast: Secondary | ICD-10-CM

## 2016-01-28 ENCOUNTER — Other Ambulatory Visit: Payer: Self-pay | Admitting: Internal Medicine

## 2016-02-15 ENCOUNTER — Ambulatory Visit (INDEPENDENT_AMBULATORY_CARE_PROVIDER_SITE_OTHER): Payer: Medicare Other | Admitting: Internal Medicine

## 2016-02-15 ENCOUNTER — Encounter: Payer: Self-pay | Admitting: Internal Medicine

## 2016-02-15 VITALS — BP 157/78 | HR 82 | Temp 98.1°F | Ht 63.0 in | Wt 166.6 lb

## 2016-02-15 DIAGNOSIS — Z7951 Long term (current) use of inhaled steroids: Secondary | ICD-10-CM

## 2016-02-15 DIAGNOSIS — J45909 Unspecified asthma, uncomplicated: Secondary | ICD-10-CM | POA: Diagnosis not present

## 2016-02-15 DIAGNOSIS — E785 Hyperlipidemia, unspecified: Secondary | ICD-10-CM

## 2016-02-15 DIAGNOSIS — I10 Essential (primary) hypertension: Secondary | ICD-10-CM | POA: Diagnosis not present

## 2016-02-15 DIAGNOSIS — L551 Sunburn of second degree: Secondary | ICD-10-CM | POA: Diagnosis not present

## 2016-02-15 DIAGNOSIS — Z1159 Encounter for screening for other viral diseases: Secondary | ICD-10-CM | POA: Diagnosis not present

## 2016-02-15 DIAGNOSIS — F411 Generalized anxiety disorder: Secondary | ICD-10-CM

## 2016-02-15 DIAGNOSIS — Z0000 Encounter for general adult medical examination without abnormal findings: Secondary | ICD-10-CM

## 2016-02-15 DIAGNOSIS — J4520 Mild intermittent asthma, uncomplicated: Secondary | ICD-10-CM

## 2016-02-15 MED ORDER — ALPRAZOLAM 0.5 MG PO TABS: 0.5000 mg | ORAL_TABLET | Freq: Three times a day (TID) | ORAL | Status: DC | PRN

## 2016-02-15 NOTE — Patient Instructions (Signed)
1. Please try cold compresses and calamine or aloe vera cream to affected areas.  If your current cream helps you can continue using it.  Ibuprofen is okay for pain.  Take it with food.  Please return to clinic if rash not better in 7 days   2. Please take all medications as prescribed.    3. If you have worsening of your symptoms or new symptoms arise, please call the clinic [phone]), or go to the ER immediately if symptoms are severe.    Return to clinic for follow-up in 3 months.
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
## 2016-02-16 LAB — LIPID PANEL
CHOL/HDL RATIO: 3.7 ratio (ref 0.0–4.4)
Cholesterol, Total: 181 mg/dL (ref 100–199)
HDL: 49 mg/dL (ref >39)
LDL Calculated: 101 mg/dL — ABNORMAL HIGH (ref 0–99)
TRIGLYCERIDES: 154 mg/dL — AB (ref 0–149)
VLDL Cholesterol Cal: 31 mg/dL (ref 5–40)

## 2016-02-16 LAB — BMP8+ANION GAP
Anion Gap: 15 mmol/L (ref 10.0–18.0)
BUN/Creatinine Ratio: 18 (ref 12–28)
BUN: 13 mg/dL (ref 8–27)
CALCIUM: 10.5 mg/dL — AB (ref 8.7–10.3)
CO2: 27 mmol/L (ref 18–29)
Chloride: 98 mmol/L (ref 96–106)
Creatinine, Ser: 0.71 mg/dL (ref 0.57–1.00)
GFR, EST AFRICAN AMERICAN: 106 mL/min/{1.73_m2} (ref >59)
GFR, EST NON AFRICAN AMERICAN: 92 mL/min/{1.73_m2} (ref >59)
Glucose: 106 mg/dL — ABNORMAL HIGH (ref 65–99)
POTASSIUM: 3.7 mmol/L (ref 3.5–5.2)
Sodium: 140 mmol/L (ref 134–144)

## 2016-02-16 LAB — HEPATITIS C ANTIBODY: Hep C Virus Ab: <0.1 s/co ratio (ref 0.0–0.9)

## 2016-02-17 ENCOUNTER — Other Ambulatory Visit: Payer: Self-pay | Admitting: Internal Medicine

## 2016-02-17 ENCOUNTER — Telehealth: Payer: Self-pay | Admitting: Internal Medicine

## 2016-02-17 DIAGNOSIS — L551 Sunburn of second degree: Secondary | ICD-10-CM | POA: Insufficient documentation

## 2016-02-17 DIAGNOSIS — Z1159 Encounter for screening for other viral diseases: Secondary | ICD-10-CM | POA: Insufficient documentation

## 2016-02-17 MED ORDER — ACYCLOVIR 400 MG PO TABS: 400.0000 mg | ORAL_TABLET | Freq: Three times a day (TID) | ORAL | Status: DC
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
## 2016-02-17 NOTE — Telephone Encounter (Signed)
Acyclovir 400mg  TID x 7 days sent to her pharmacy.

## 2016-02-17 NOTE — Assessment & Plan Note (Addendum)
Birth cohort [phone], needs HCV screening.  PAP complete 09/16 but no records.   Plan:  Check HCV ab.  Send for PAP records.  Offer Zostavax next visit.

## 2016-02-17 NOTE — Assessment & Plan Note (Addendum)
Assessment:  Controlled on current therapy. Plan:  Continue BID Advair and prn albuterol.  Assess for ability to step down therapy at next visit.

## 2016-02-17 NOTE — Telephone Encounter (Signed)
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 She wanted to know if we could send in some Valtrex.

## 2016-02-17 NOTE — Assessment & Plan Note (Signed)
Assessment:  Birth cohort [phone] Plan:  Check HCV ab

## 2016-02-17 NOTE — Assessment & Plan Note (Signed)
BP Readings from Last 3 Encounters:  02/15/16 157/78  01/13/16 155/83  11/12/15 140/77    Lab Results  Component Value Date   NA 140 02/15/2016   K 3.7 02/15/2016   CREATININE 0.71 02/15/2016    Assessment: Blood pressure control:  slight elevation Progress toward BP goal:   slightly worse Comments: BP slightly elevated today in the setting of significant discomfort from sunburn.  She reports compliance with meds and has taken them today.  Plan: Medications:  continue current medications:  HCTZ 12.5mg  daily, metoprolol 25mg  BID, valsartan 320mg  daily Educational resources provided: brochure (denies) Self management tools provided:   Other plans: RTC in 3 months

## 2016-02-17 NOTE — Telephone Encounter (Signed)
Needs to talk to nurse about problem getting worse on since monday

## 2016-02-17 NOTE — Assessment & Plan Note (Addendum)
Assessment:  Compliant with Pravastatin 40mg  daily.  No ADRs.  LFTs normal 9 months ago.  Last lipid 2 years ago. Plan:  Lipid panel today.  Check LFTs next visit.

## 2016-02-17 NOTE — Assessment & Plan Note (Addendum)
Assessment:  Moderate sunburn of upper torso (clearly in sun exposed area) after a few hours at the beach last week.  She has been using a topical sunburn relief gel with partial relief.  She says she has never burned before and she used and reapplied sunscreen.  She was out on a lounge chair for about 2 hours. Plan:  I advised continued conservative care - current cream or calamine, add ibuprofen for pain (w/ food), RTC if rash not clearing up in next week.  Continue sunscreen, wear hats, sun avoidance.

## 2016-02-18 ENCOUNTER — Telehealth: Payer: Self-pay | Admitting: *Deleted

## 2016-02-18 NOTE — Telephone Encounter (Signed)
Notified patient.

## 2016-02-18 NOTE — Progress Notes (Signed)
Internal Medicine Clinic Attending  I saw and evaluated the patient.  I personally confirmed the key portions of the history and exam documented by Dr. Wilson and I reviewed pertinent patient test results.  The assessment, diagnosis, and plan were formulated together and I agree with the documentation in the resident's note. 

## 2016-02-24 ENCOUNTER — Encounter: Payer: Self-pay | Admitting: *Deleted

## 2016-02-25 ENCOUNTER — Ambulatory Visit (INDEPENDENT_AMBULATORY_CARE_PROVIDER_SITE_OTHER): Payer: Medicare Other | Admitting: Internal Medicine

## 2016-02-25 ENCOUNTER — Encounter: Payer: Self-pay | Admitting: Internal Medicine

## 2016-02-25 VITALS — BP 148/73 | HR 87 | Temp 98.5°F | Resp 18 | Ht 63.0 in | Wt 171.1 lb

## 2016-02-25 DIAGNOSIS — L551 Sunburn of second degree: Secondary | ICD-10-CM | POA: Diagnosis not present

## 2016-02-25 DIAGNOSIS — L989 Disorder of the skin and subcutaneous tissue, unspecified: Secondary | ICD-10-CM

## 2016-02-25 DIAGNOSIS — L988 Other specified disorders of the skin and subcutaneous tissue: Secondary | ICD-10-CM | POA: Diagnosis not present

## 2016-02-25 DIAGNOSIS — B001 Herpesviral vesicular dermatitis: Secondary | ICD-10-CM | POA: Diagnosis not present

## 2016-02-25 MED ORDER — ACYCLOVIR 400 MG PO TABS: 400.0000 mg | ORAL_TABLET | Freq: Three times a day (TID) | ORAL | Status: AC

## 2016-02-25 NOTE — Patient Instructions (Addendum)
1. I have sent in another three days of cold sore treatment. Please continue sun screen and keep skin covered.  You will receive a call from dermatologist.  Please call our office back if you do not hear form dermatologist in 1 week.   2. Please take all medications as prescribed.    3. If you have worsening of your symptoms or new symptoms arise, please call the clinic [phone] or go to the ER immediately if symptoms are severe.

## 2016-02-25 NOTE — Progress Notes (Signed)
Subjective:    Patient ID: [name], female    DOB: [birth year], 65 y.o.   MRN: [id number]  HPI Comments: [name] is a 65 year old woman with PMH as below here for follow-up of skin rash.  Please see problem based charting for the status of this condition.     Past Medical History  Diagnosis Date  . CAD (coronary artery disease)     Nonobstructive on cath 2003 and 2005  . Hypertension   . Adenocarcinoma of breast ([place]) 2009    right, s/p chemo/ xrt  . Depression   . Anal fissure   . GERD (gastroesophageal reflux disease)   . Dog bite(E906.0)   . Diverticulosis of colon (without mention of hemorrhage)   . Spinal stenosis, lumbar region, without neurogenic claudication   . Anxiety   . Panic attacks   . Asthma   . Irritable bowel syndrome   . Hiatal hernia   . Jaundice     Hx of Jaundice at age 82 from "dirty restuarant". Unsure of Hepatitis type  . Neuropathy ([place])     bilat LE's  . Chronic back pain   . Lumbar radiculopathy     bilat LE's  . Pain management   . Headache    Current Outpatient Prescriptions on File Prior to Visit  Medication Sig Dispense Refill  . acetaminophen (TYLENOL) 500 MG tablet Take 500 mg by mouth every 6 (six) hours as needed for moderate pain.    Marland Kitchen acyclovir (ZOVIRAX) 400 MG tablet Take 1 tablet (400 mg total) by mouth 3 (three) times daily. 21 tablet 0  . ALPRAZolam (XANAX) 0.5 MG tablet Take 1 tablet (0.5 mg total) by mouth 3 (three) times daily as needed for anxiety. 90 tablet 0  . ALPRAZolam (XANAX) 0.5 MG tablet Take 1 tablet (0.5 mg total) by mouth 3 (three) times daily as needed for anxiety. 90 tablet 0  . ALPRAZolam (XANAX) 0.5 MG tablet Take 1 tablet (0.5 mg total) by mouth 3 (three) times daily as needed for anxiety. 90 tablet 0  . ascorbic acid (VITAMIN C) 1000 MG tablet Take 500 mg by mouth daily.     Marland Kitchen aspirin EC 81 MG tablet Take 81 mg by mouth daily.    . Calcium Carbonate-Vitamin D (CALCIUM-CARB 600 + D) 600-125 MG-UNIT  TABS Take 1 capsule by mouth daily.     . cetirizine (ZYRTEC) 10 MG tablet Take 1 tablet (10 mg total) by mouth daily. 90 tablet 1  . cycloSPORINE (RESTASIS) 0.05 % ophthalmic emulsion Place 1 drop into both eyes 2 (two) times daily.      . diclofenac sodium (VOLTAREN) 1 % GEL Apply topically. 1% GEL    . fluticasone (FLONASE) 50 MCG/ACT nasal spray Place 1-2 sprays into both nostrils daily. 16 g 3  . Fluticasone-Salmeterol (ADVAIR) 250-50 MCG/DOSE AEPB Inhale 1 puff into the lungs 2 (two) times daily. 60 each 5  . hydrochlorothiazide (HYDRODIURIL) 12.5 MG tablet Take 1 tablet (12.5 mg total) by mouth daily. 90 tablet 1  . metoprolol tartrate (LOPRESSOR) 25 MG tablet Take 1 tablet (25 mg total) by mouth 2 (two) times daily. 180 tablet 1  . oxymetazoline (AFRIN) 0.05 % nasal spray Place 1 spray into both nostrils 2 (two) times daily as needed for congestion.    . pantoprazole (PROTONIX) 40 MG tablet take 1 tablet by mouth once daily 90 tablet 0  . Phenylephrine-Guaifenesin 5-200 MG/5ML SYRP Take 5 mLs by mouth every  6 (six) hours as needed. 473 mL 0  . polyethylene glycol powder (GLYCOLAX/MIRALAX) powder Take 17gm mixed with water or juice daily 255 g 3  . pravastatin (PRAVACHOL) 40 MG tablet take 1 tablet by mouth every evening 90 tablet 0  . PROAIR HFA 108 (90 BASE) MCG/ACT inhaler INHALE 2 PUFFS BY MOUTH IN TO THE LUNGS EVERY 4 HOURS AS NEEDED FOR WHEEZING OR SHORTNESS OF BREATH 8.5 Inhaler 3  . valsartan (DIOVAN) 320 MG tablet take 1 tablet by mouth once daily 90 tablet 1  . vitamin E (VITAMIN E) 400 UNIT capsule Take 400 Units by mouth daily.     No current facility-administered medications on file prior to visit.    Review of Systems  Constitutional: Positive for chills. Negative for appetite change.  Gastrointestinal: Positive for diarrhea. Negative for nausea and vomiting.       Had some discomfort with swallowing her cholesterol pill last night but otherwise, no pain with swallowing  food or liquid.  Genitourinary: Negative for vaginal bleeding and genital sores.       She denies concern for STI She noticed white vaginal discharge a few days ago that she feels is yeast infx.  Skin: Positive for rash.       Filed Vitals:   02/25/16 1340  BP: 148/73  Pulse: 87  Temp: 98.5 F (36.9 C)  TempSrc: Oral  Resp: 18  Height: 5\' 3"  (1.6 m)  Weight: 171 lb 1.6 oz (77.61 kg)  SpO2: 98%    Objective:   Physical Exam  Constitutional: She is oriented to person, place, and time. She appears well-developed. No distress.  HENT:  Head: Normocephalic and atraumatic.  Mouth/Throat: Oropharynx is clear and moist. No oropharyngeal exudate.  Small red lesions at the corners of the mouth.  Eyes: Conjunctivae and EOM are normal. Pupils are equal, round, and reactive to light. No scleral icterus.  Neck: Neck supple.  Cardiovascular: Normal rate, regular rhythm and normal heart sounds.  Exam reveals no gallop and no friction rub.   No murmur heard. Pulmonary/Chest: Effort normal and breath sounds normal. No respiratory distress. She has no wheezes. She has no rales.  Abdominal: Soft. Bowel sounds are normal. She exhibits no distension. There is no tenderness. There is no rebound and no guarding.  Musculoskeletal: Normal range of motion. She exhibits edema.  Trace B/L LE edema  Neurological: She is alert and oriented to person, place, and time. No cranial nerve deficit.  Skin: Skin is warm and dry. Rash noted. She is not diaphoretic. There is erythema.  The sunburn covering her chest has improved since last visit.  There are three vertical crusted over lesions on the left upper chest where the bra strap would lay.  Psychiatric: She has a normal mood and affect. Her behavior is normal. Thought content normal.  Vitals reviewed.         Assessment & Plan:  Please see problem based charting for A&P.

## 2016-02-26 DIAGNOSIS — L989 Disorder of the skin and subcutaneous tissue, unspecified: Secondary | ICD-10-CM | POA: Insufficient documentation

## 2016-02-26 DIAGNOSIS — B001 Herpesviral vesicular dermatitis: Secondary | ICD-10-CM | POA: Insufficient documentation

## 2016-02-26 LAB — PTH, INTACT AND CALCIUM
CALCIUM: 10 mg/dL (ref 8.7–10.3)
PTH: 66 pg/mL — ABNORMAL HIGH (ref 15–65)

## 2016-02-26 LAB — CALCIUM, IONIZED: CALCIUM ION: 5.6 mg/dL (ref 4.5–5.6)

## 2016-02-26 NOTE — Assessment & Plan Note (Addendum)
Assessment:  There are three crusted large crusted over lesions on the left chest on the skin where her bra strap would lay.  She describes them as initially flat and red, not vesicular, and then subsequently crusted over.The skin on the opposite side is normal so I doubt contact dermatitis (clothing would touch both sides).  The lesions are vertical and do not follow a dermatomal pattern to suggest shingles.  Unclear if related to current herpes labialis outbreak. Plan:  Check HIV ab. Refer to derm.

## 2016-02-26 NOTE — Assessment & Plan Note (Signed)
Assessment:  Few vesicular lesions were present on her lips at visit last week.  She is on her last day of 7 day course of acyclovir.  She felt things were improving but now has new lesions along the vermillion border, mostly at the corners of her mouth.  The lesions are itchy and painful.  She is able to swallow food and drink.  She denies current sexual activity or concern for STI.  She denies similar lesions in the genital area. Plan:  Will extend acyclovir course for another 3 days to see if this helps.  Will check HIV ab today.

## 2016-02-26 NOTE — Assessment & Plan Note (Signed)
Assessment:  The sunburn looks much better.  She is continuing to use topical gel and sun protection. Plan:  Continue conservative care.

## 2016-02-26 NOTE — Assessment & Plan Note (Addendum)
Assessment:  Recent Ca slightly elevated at 10.5.  She is on low dose Ca-vit D supplement. Plan:  Check ionized Ca, PTH, PTHrP, vit D levels.

## 2016-02-27 NOTE — Progress Notes (Signed)
Internal Medicine Clinic Attending  I saw and evaluated the patient.  I personally confirmed the key portions of the history and exam documented by Dr. Wilson and I reviewed pertinent patient test results.  The assessment, diagnosis, and plan were formulated together and I agree with the documentation in the resident's note. 

## 2016-02-29 ENCOUNTER — Telehealth: Payer: Self-pay | Admitting: Internal Medicine

## 2016-02-29 ENCOUNTER — Ambulatory Visit: Payer: Self-pay

## 2016-02-29 ENCOUNTER — Other Ambulatory Visit: Payer: Self-pay | Admitting: Internal Medicine

## 2016-02-29 LAB — VITAMIN D 1,25 DIHYDROXY
VITAMIN D 1, 25 (OH) TOTAL: 60 pg/mL
Vitamin D3 1, 25 (OH)2: 59 pg/mL

## 2016-02-29 LAB — VITAMIN D 25 HYDROXY (VIT D DEFICIENCY, FRACTURES): VIT D 25 HYDROXY: 26.8 ng/mL — AB (ref 30.0–100.0)

## 2016-02-29 LAB — HIV ANTIBODY (ROUTINE TESTING W REFLEX): HIV SCREEN 4TH GENERATION: NONREACTIVE

## 2016-02-29 NOTE — Telephone Encounter (Signed)
I spoke with [name] this afternoon.

## 2016-02-29 NOTE — Telephone Encounter (Signed)
Please call pt back regarding her lab work.  She states she can be reached today after 3pm.

## 2016-03-01 ENCOUNTER — Ambulatory Visit (INDEPENDENT_AMBULATORY_CARE_PROVIDER_SITE_OTHER): Payer: Medicare Other | Admitting: Psychiatry

## 2016-03-01 DIAGNOSIS — F4323 Adjustment disorder with mixed anxiety and depressed mood: Secondary | ICD-10-CM

## 2016-03-01 LAB — PTH-RELATED PEPTIDE: PTH-related peptide: <1.1 pmol/L

## 2016-03-03 ENCOUNTER — Other Ambulatory Visit: Payer: Medicare Other

## 2016-03-04 DIAGNOSIS — K130 Diseases of lips: Secondary | ICD-10-CM | POA: Diagnosis not present

## 2016-03-04 DIAGNOSIS — B0089 Other herpesviral infection: Secondary | ICD-10-CM | POA: Diagnosis not present

## 2016-03-04 LAB — CALCIUM, URINE, 24 HOUR
CALCIUM 24H UR: 92 mg/(24.h) — AB (ref 100.0–300.0)
Calcium, Urine: 4 mg/dL

## 2016-03-11 ENCOUNTER — Ambulatory Visit: Payer: Self-pay

## 2016-03-16 DIAGNOSIS — G894 Chronic pain syndrome: Secondary | ICD-10-CM | POA: Diagnosis not present

## 2016-03-16 DIAGNOSIS — M5136 Other intervertebral disc degeneration, lumbar region: Secondary | ICD-10-CM | POA: Diagnosis not present

## 2016-03-16 DIAGNOSIS — M5417 Radiculopathy, lumbosacral region: Secondary | ICD-10-CM | POA: Diagnosis not present

## 2016-03-17 ENCOUNTER — Ambulatory Visit (INDEPENDENT_AMBULATORY_CARE_PROVIDER_SITE_OTHER): Payer: Medicare Other | Admitting: Psychiatry

## 2016-03-17 DIAGNOSIS — F4323 Adjustment disorder with mixed anxiety and depressed mood: Secondary | ICD-10-CM | POA: Diagnosis not present

## 2016-03-18 DIAGNOSIS — B029 Zoster without complications: Secondary | ICD-10-CM | POA: Diagnosis not present

## 2016-03-23 DIAGNOSIS — B028 Zoster with other complications: Secondary | ICD-10-CM | POA: Diagnosis not present

## 2016-03-29 ENCOUNTER — Ambulatory Visit: Payer: Self-pay

## 2016-04-05 ENCOUNTER — Ambulatory Visit (INDEPENDENT_AMBULATORY_CARE_PROVIDER_SITE_OTHER): Payer: Medicare Other | Admitting: Psychiatry

## 2016-04-05 DIAGNOSIS — L02229 Furuncle of trunk, unspecified: Secondary | ICD-10-CM | POA: Diagnosis not present

## 2016-04-05 DIAGNOSIS — F4323 Adjustment disorder with mixed anxiety and depressed mood: Secondary | ICD-10-CM | POA: Diagnosis not present

## 2016-04-05 DIAGNOSIS — B029 Zoster without complications: Secondary | ICD-10-CM | POA: Diagnosis not present

## 2016-04-05 DIAGNOSIS — B9689 Other specified bacterial agents as the cause of diseases classified elsewhere: Secondary | ICD-10-CM | POA: Diagnosis not present

## 2016-04-13 ENCOUNTER — Other Ambulatory Visit: Payer: Self-pay | Admitting: Internal Medicine

## 2016-04-13 DIAGNOSIS — I10 Essential (primary) hypertension: Secondary | ICD-10-CM

## 2016-04-13 DIAGNOSIS — J322 Chronic ethmoidal sinusitis: Secondary | ICD-10-CM

## 2016-04-14 NOTE — Telephone Encounter (Signed)
Last appt 02/25/16; no f/u appt scheduled.

## 2016-04-19 ENCOUNTER — Ambulatory Visit: Payer: Medicare Other | Admitting: Psychiatry

## 2016-04-20 ENCOUNTER — Ambulatory Visit: Payer: Self-pay

## 2016-04-28 ENCOUNTER — Ambulatory Visit: Payer: Medicare Other | Admitting: Psychiatry

## 2016-05-02 ENCOUNTER — Telehealth: Payer: Self-pay | Admitting: Internal Medicine

## 2016-05-02 NOTE — Telephone Encounter (Signed)
APT. REMINDER CALL, LMTCB °

## 2016-05-03 ENCOUNTER — Inpatient Hospital Stay (HOSPITAL_COMMUNITY)
Admission: AD | Admit: 2016-05-03 | Discharge: 2016-05-05 | DRG: 149 | Disposition: A | Discharge status: Home Health | Payer: Medicare Other | Source: Ambulatory Visit | Attending: Oncology | Admitting: Oncology

## 2016-05-03 ENCOUNTER — Inpatient Hospital Stay (HOSPITAL_COMMUNITY): Payer: Medicare Other

## 2016-05-03 ENCOUNTER — Encounter (HOSPITAL_COMMUNITY): Payer: Self-pay | Admitting: Internal Medicine

## 2016-05-03 ENCOUNTER — Ambulatory Visit (INDEPENDENT_AMBULATORY_CARE_PROVIDER_SITE_OTHER): Payer: Medicare Other | Admitting: Internal Medicine

## 2016-05-03 ENCOUNTER — Ambulatory Visit (HOSPITAL_COMMUNITY): Payer: Medicare Other

## 2016-05-03 ENCOUNTER — Ambulatory Visit: Payer: Self-pay

## 2016-05-03 VITALS — BP 169/87 | HR 81 | Temp 98.2°F | Ht 63.0 in | Wt 173.4 lb

## 2016-05-03 DIAGNOSIS — Z79899 Other long term (current) drug therapy: Secondary | ICD-10-CM | POA: Diagnosis not present

## 2016-05-03 DIAGNOSIS — M4806 Spinal stenosis, lumbar region: Secondary | ICD-10-CM | POA: Diagnosis not present

## 2016-05-03 DIAGNOSIS — I252 Old myocardial infarction: Secondary | ICD-10-CM | POA: Diagnosis not present

## 2016-05-03 DIAGNOSIS — Z853 Personal history of malignant neoplasm of breast: Secondary | ICD-10-CM

## 2016-05-03 DIAGNOSIS — Z885 Allergy status to narcotic agent status: Secondary | ICD-10-CM

## 2016-05-03 DIAGNOSIS — J329 Chronic sinusitis, unspecified: Secondary | ICD-10-CM | POA: Diagnosis present

## 2016-05-03 DIAGNOSIS — R21 Rash and other nonspecific skin eruption: Secondary | ICD-10-CM

## 2016-05-03 DIAGNOSIS — Z886 Allergy status to analgesic agent status: Secondary | ICD-10-CM | POA: Diagnosis not present

## 2016-05-03 DIAGNOSIS — L989 Disorder of the skin and subcutaneous tissue, unspecified: Secondary | ICD-10-CM | POA: Diagnosis present

## 2016-05-03 DIAGNOSIS — F410 Panic disorder [episodic paroxysmal anxiety] without agoraphobia: Secondary | ICD-10-CM | POA: Diagnosis not present

## 2016-05-03 DIAGNOSIS — R2681 Unsteadiness on feet: Secondary | ICD-10-CM | POA: Diagnosis present

## 2016-05-03 DIAGNOSIS — Z91041 Radiographic dye allergy status: Secondary | ICD-10-CM | POA: Diagnosis not present

## 2016-05-03 DIAGNOSIS — G8929 Other chronic pain: Secondary | ICD-10-CM | POA: Diagnosis not present

## 2016-05-03 DIAGNOSIS — I10 Essential (primary) hypertension: Secondary | ICD-10-CM | POA: Diagnosis not present

## 2016-05-03 DIAGNOSIS — H8309 Labyrinthitis, unspecified ear: Principal | ICD-10-CM | POA: Diagnosis present

## 2016-05-03 DIAGNOSIS — B349 Viral infection, unspecified: Secondary | ICD-10-CM | POA: Diagnosis not present

## 2016-05-03 DIAGNOSIS — Z7982 Long term (current) use of aspirin: Secondary | ICD-10-CM

## 2016-05-03 DIAGNOSIS — I2510 Atherosclerotic heart disease of native coronary artery without angina pectoris: Secondary | ICD-10-CM | POA: Diagnosis present

## 2016-05-03 DIAGNOSIS — R42 Dizziness and giddiness: Secondary | ICD-10-CM

## 2016-05-03 DIAGNOSIS — K219 Gastro-esophageal reflux disease without esophagitis: Secondary | ICD-10-CM | POA: Diagnosis not present

## 2016-05-03 DIAGNOSIS — Z87891 Personal history of nicotine dependence: Secondary | ICD-10-CM

## 2016-05-03 DIAGNOSIS — Z91040 Latex allergy status: Secondary | ICD-10-CM | POA: Diagnosis not present

## 2016-05-03 DIAGNOSIS — R0789 Other chest pain: Secondary | ICD-10-CM | POA: Diagnosis present

## 2016-05-03 DIAGNOSIS — I6789 Other cerebrovascular disease: Secondary | ICD-10-CM | POA: Diagnosis not present

## 2016-05-03 DIAGNOSIS — H5509 Other forms of nystagmus: Secondary | ICD-10-CM | POA: Diagnosis not present

## 2016-05-03 DIAGNOSIS — J309 Allergic rhinitis, unspecified: Secondary | ICD-10-CM | POA: Diagnosis present

## 2016-05-03 DIAGNOSIS — C50211 Malignant neoplasm of upper-inner quadrant of right female breast: Secondary | ICD-10-CM | POA: Diagnosis present

## 2016-05-03 DIAGNOSIS — Z880 Allergy status to penicillin: Secondary | ICD-10-CM

## 2016-05-03 DIAGNOSIS — R269 Unspecified abnormalities of gait and mobility: Secondary | ICD-10-CM | POA: Diagnosis not present

## 2016-05-03 DIAGNOSIS — R079 Chest pain, unspecified: Secondary | ICD-10-CM

## 2016-05-03 DIAGNOSIS — F329 Major depressive disorder, single episode, unspecified: Secondary | ICD-10-CM | POA: Diagnosis present

## 2016-05-03 DIAGNOSIS — J45909 Unspecified asthma, uncomplicated: Secondary | ICD-10-CM | POA: Diagnosis present

## 2016-05-03 DIAGNOSIS — Z881 Allergy status to other antibiotic agents status: Secondary | ICD-10-CM

## 2016-05-03 DIAGNOSIS — M47816 Spondylosis without myelopathy or radiculopathy, lumbar region: Secondary | ICD-10-CM | POA: Diagnosis present

## 2016-05-03 DIAGNOSIS — Z91048 Other nonmedicinal substance allergy status: Secondary | ICD-10-CM

## 2016-05-03 DIAGNOSIS — Z8249 Family history of ischemic heart disease and other diseases of the circulatory system: Secondary | ICD-10-CM

## 2016-05-03 DIAGNOSIS — E876 Hypokalemia: Secondary | ICD-10-CM | POA: Diagnosis present

## 2016-05-03 DIAGNOSIS — Z9221 Personal history of antineoplastic chemotherapy: Secondary | ICD-10-CM | POA: Diagnosis not present

## 2016-05-03 DIAGNOSIS — Z888 Allergy status to other drugs, medicaments and biological substances status: Secondary | ICD-10-CM

## 2016-05-03 LAB — VAS US CAROTID
LCCADDIAS: -19 cm/s
LCCAPDIAS: -21 cm/s
LEFT ECA DIAS: -9 cm/s
LEFT VERTEBRAL DIAS: -13 cm/s
LICADDIAS: -23 cm/s
LICADSYS: -62 cm/s
LICAPSYS: -50 cm/s
Left CCA dist sys: -61 cm/s
Left CCA prox sys: -81 cm/s
Left ICA prox dias: -20 cm/s
RIGHT ECA DIAS: -6 cm/s
RIGHT VERTEBRAL DIAS: -13 cm/s
Right CCA prox dias: -18 cm/s
Right CCA prox sys: -72 cm/s
Right cca dist sys: -73 cm/s

## 2016-05-03 LAB — COMPREHENSIVE METABOLIC PANEL
ALT: 21 U/L (ref 14–54)
AST: 28 U/L (ref 15–41)
Albumin: 4.7 g/dL (ref 3.5–5.0)
Alkaline Phosphatase: 59 U/L (ref 38–126)
Anion gap: 8 (ref 5–15)
BILIRUBIN TOTAL: 1 mg/dL (ref 0.3–1.2)
BUN: 12 mg/dL (ref 6–20)
CO2: 29 mmol/L (ref 22–32)
Calcium: 10.2 mg/dL (ref 8.9–10.3)
Chloride: 102 mmol/L (ref 101–111)
Creatinine, Ser: 0.8 mg/dL (ref 0.44–1.00)
Glucose, Bld: 114 mg/dL — ABNORMAL HIGH (ref 65–99)
POTASSIUM: 3.5 mmol/L (ref 3.5–5.1)
Sodium: 139 mmol/L (ref 135–145)
TOTAL PROTEIN: 7.5 g/dL (ref 6.5–8.1)

## 2016-05-03 LAB — CBC
HEMATOCRIT: 45.9 % (ref 36.0–46.0)
Hemoglobin: 15.2 g/dL — ABNORMAL HIGH (ref 12.0–15.0)
MCH: 31 pg (ref 26.0–34.0)
MCHC: 33.1 g/dL (ref 30.0–36.0)
MCV: 93.5 fL (ref 78.0–100.0)
PLATELETS: 217 10*3/uL (ref 150–400)
RBC: 4.91 MIL/uL (ref 3.87–5.11)
RDW: 13.3 % (ref 11.5–15.5)
WBC: 4.7 10*3/uL (ref 4.0–10.5)

## 2016-05-03 LAB — TROPONIN I
Troponin I: <0.03 ng/mL (ref <0.03)
Troponin I: <0.03 ng/mL (ref <0.03)

## 2016-05-03 LAB — BASIC METABOLIC PANEL
ANION GAP: 6 (ref 5–15)
BUN: 11 mg/dL (ref 6–20)
CHLORIDE: 103 mmol/L (ref 101–111)
CO2: 31 mmol/L (ref 22–32)
CREATININE: 0.77 mg/dL (ref 0.44–1.00)
Calcium: 10.4 mg/dL — ABNORMAL HIGH (ref 8.9–10.3)
GFR calc non Af Amer: >60 mL/min (ref >60)
Glucose, Bld: 121 mg/dL — ABNORMAL HIGH (ref 65–99)
Potassium: 3.4 mmol/L — ABNORMAL LOW (ref 3.5–5.1)
Sodium: 140 mmol/L (ref 135–145)

## 2016-05-03 MED ORDER — CYCLOSPORINE 0.05 % OP EMUL
1.0000 [drp] | Freq: Two times a day (BID) | OPHTHALMIC | Status: DC
  Administered 2016-05-03 – 2016-05-05 (×4): 1 [drp] via OPHTHALMIC
  Filled 2016-05-03 (×4): qty 1

## 2016-05-03 MED ORDER — ALBUTEROL SULFATE (2.5 MG/3ML) 0.083% IN NEBU: 2.5000 mg | INHALATION_SOLUTION | RESPIRATORY_TRACT | Status: DC | PRN

## 2016-05-03 MED ORDER — PANTOPRAZOLE SODIUM 40 MG PO TBEC
40.0000 mg | DELAYED_RELEASE_TABLET | Freq: Every day | ORAL | Status: DC
  Administered 2016-05-04 – 2016-05-05 (×2): 40 mg via ORAL
  Filled 2016-05-03 (×2): qty 1

## 2016-05-03 MED ORDER — ACETAMINOPHEN 500 MG PO TABS
500.0000 mg | ORAL_TABLET | Freq: Four times a day (QID) | ORAL | Status: DC | PRN
  Administered 2016-05-03 – 2016-05-05 (×4): 500 mg via ORAL
  Filled 2016-05-03 (×4): qty 1

## 2016-05-03 MED ORDER — OXYMETAZOLINE HCL 0.05 % NA SOLN
1.0000 | Freq: Two times a day (BID) | NASAL | Status: DC | PRN
  Administered 2016-05-04: 1 via NASAL
  Filled 2016-05-03: qty 15

## 2016-05-03 MED ORDER — ALPRAZOLAM 0.5 MG PO TABS
0.5000 mg | ORAL_TABLET | Freq: Three times a day (TID) | ORAL | Status: DC | PRN
  Administered 2016-05-03 – 2016-05-05 (×4): 0.5 mg via ORAL
  Filled 2016-05-03 (×4): qty 1

## 2016-05-03 MED ORDER — MOMETASONE FURO-FORMOTEROL FUM 200-5 MCG/ACT IN AERO
2.0000 | INHALATION_SPRAY | Freq: Two times a day (BID) | RESPIRATORY_TRACT | Status: DC
  Filled 2016-05-03: qty 8.8

## 2016-05-03 MED ORDER — LORATADINE 10 MG PO TABS
10.0000 mg | ORAL_TABLET | Freq: Every day | ORAL | Status: DC
  Administered 2016-05-04 – 2016-05-05 (×2): 10 mg via ORAL
  Filled 2016-05-03 (×2): qty 1

## 2016-05-03 MED ORDER — DICLOFENAC SODIUM 1 % TD GEL
4.0000 g | Freq: Four times a day (QID) | TRANSDERMAL | Status: DC
  Administered 2016-05-03 – 2016-05-04 (×5): 4 g via TOPICAL
  Filled 2016-05-03: qty 100

## 2016-05-03 MED ORDER — PRAVASTATIN SODIUM 40 MG PO TABS
40.0000 mg | ORAL_TABLET | Freq: Every evening | ORAL | Status: DC
  Administered 2016-05-03 – 2016-05-04 (×2): 40 mg via ORAL
  Filled 2016-05-03 (×2): qty 1

## 2016-05-03 MED ORDER — STROKE: EARLY STAGES OF RECOVERY BOOK
Freq: Once | Status: AC
  Administered 2016-05-03: 13:00:00
  Filled 2016-05-03: qty 1

## 2016-05-03 MED ORDER — ENOXAPARIN SODIUM 40 MG/0.4ML ~~LOC~~ SOLN
40.0000 mg | SUBCUTANEOUS | Status: DC
  Administered 2016-05-03 – 2016-05-04 (×2): 40 mg via SUBCUTANEOUS
  Filled 2016-05-03 (×2): qty 0.4

## 2016-05-03 MED ORDER — ASPIRIN EC 81 MG PO TBEC
81.0000 mg | DELAYED_RELEASE_TABLET | Freq: Every day | ORAL | Status: DC
  Administered 2016-05-04 – 2016-05-05 (×2): 81 mg via ORAL
  Filled 2016-05-03 (×2): qty 1

## 2016-05-03 NOTE — Progress Notes (Signed)
**  Preliminary report by tech**  Carotid artery duplex completed. Findings are consistent with a 1-39 percent stenosis involving the right internal carotid artery and the left internal carotid artery. The vertebral arteries demonstrate antegrade flow.  05/03/16 4:01 PM [name] RVT

## 2016-05-03 NOTE — Progress Notes (Signed)
Pt arrived direct admit to unit with no noted distress. Pt stable, neuro intact. Pt oriented to room. Safety measures in place. Call bell within reach. Will continue to monitor. Telemetry monitoring.

## 2016-05-03 NOTE — Evaluation (Signed)
Clinical/Bedside Swallow Evaluation Patient Details  Name: [name] MRN: [id number] Date of Birth: [birth year]  Today's Date: 05/03/2016 Time: SLP Start Time (ACUTE ONLY): 1425 SLP Stop Time (ACUTE ONLY): 1433 SLP Time Calculation (min) (ACUTE ONLY): 8 min  Past Medical History:  Past Medical History:  Diagnosis Date  . Adenocarcinoma of breast ([place]) 2009   right, s/p chemo/ xrt  . Anal fissure   . Anxiety   . Asthma   . CAD (coronary artery disease)    Nonobstructive on cath 2003 and 2005  . Chronic back pain   . Depression   . Diverticulosis of colon (without mention of hemorrhage)   . Dog bite(E906.0)   . GERD (gastroesophageal reflux disease)   . Headache   . Hiatal hernia   . Hypertension   . Irritable bowel syndrome   . Jaundice    Hx of Jaundice at age 72 from "dirty restuarant". Unsure of Hepatitis type  . Lumbar radiculopathy    bilat LE's  . Neuropathy ([place])    bilat LE's  . Pain management   . Panic attacks   . Spinal stenosis, lumbar region, without neurogenic claudication    Past Surgical History:  Past Surgical History:  Procedure Laterality Date  . BLADDER REPAIR     tact  . BREAST LUMPECTOMY Right   . BREAST RECONSTRUCTION Right   . BREAST REDUCTION SURGERY Left   . CARDIAC CATHETERIZATION  2003, 2005  . CATARACT EXTRACTION    . CHOLECYSTECTOMY    . COLONOSCOPY  2013   Diverticulosis  . ESOPHAGEAL MANOMETRY  10/08/2012   Procedure: ESOPHAGEAL MANOMETRY (EM);  Surgeon: Sable Feil, MD;  Location: [facility];  Service: Endoscopy;  Laterality: N/A;  . ESOPHAGOGASTRODUODENOSCOPY  2014   Normal   . PARTIAL HYSTERECTOMY  1987   HPI:  64 y.o.female with new onset diziness admitted for stroke work up. PMH significant for breast cancer, anxiety, asthma, CAD, back pain, depression, HTN   Assessment / Plan / Recommendation Clinical Impression  Pt's oropharyngeal function appears WFL. She does endorse a h/o esophageal issues including  reflux and strictures, therefore esophageal precautions were reviewed. Recommend to initiate regular diet and thin liquids. No acute SLP needs identified in regards to swallowing.    Aspiration Risk  No limitations    Diet Recommendation Regular;Thin liquid   Liquid Administration via: Cup;Straw Medication Administration: Whole meds with liquid Supervision: Patient able to self feed;Intermittent supervision to cue for compensatory strategies Compensations: Slow rate;Small sips/bites;Follow solids with liquid Postural Changes: Seated upright at 90 degrees;Remain upright for at least 30 minutes after po intake    Other  Recommendations Oral Care Recommendations: Oral care BID   Follow up Recommendations   (tba for cognition)    Frequency and Duration min 2x/week          Prognosis        Swallow Study   General HPI: 65 y.o.female with new onset diziness admitted for stroke work up. PMH significant for breast cancer, anxiety, asthma, CAD, back pain, depression, HTN Type of Study: Bedside Swallow Evaluation Previous Swallow Assessment: none in chart Diet Prior to this Study: NPO Temperature Spikes Noted: No Respiratory Status: Room air History of Recent Intubation: No Behavior/Cognition: Alert;Cooperative Oral Cavity Assessment: Within Functional Limits Oral Care Completed by SLP: No Oral Cavity - Dentition: Adequate natural dentition Vision: Functional for self-feeding Self-Feeding Abilities: Able to feed self Patient Positioning: Upright in bed Baseline Vocal Quality: Normal    Oral/Motor/Sensory  Function Overall Oral Motor/Sensory Function: Within functional limits   Ice Chips Ice chips: Not tested   Thin Liquid Thin Liquid: Within functional limits Presentation: Cup;Self Fed;Straw    Nectar Thick Nectar Thick Liquid: Not tested   Honey Thick Honey Thick Liquid: Not tested   Puree Puree: Within functional limits Presentation: Self Fed;Spoon   Solid   GO    Solid: Within functional limits Presentation: Self Ennis Forts 05/03/2016,2:48 PM  Germain Osgood, M.A. CCC-SLP [phone]

## 2016-05-03 NOTE — Progress Notes (Signed)
Patient transported to 5 Central Bed 18 via wheelchair.  Report given to [facility] RN on 5 Central.  Patient remains alert and oriented.  Sander Nephew, RN

## 2016-05-03 NOTE — H&P (Signed)
Date: 05/03/2016               Patient Name:  [name] MRN: [id number]  DOB: [birth year] Age / Sex: 65 y.o., female   PCP: Holley Raring, MD         Medical Service: Internal Medicine Teaching Service         Attending Physician: Dr. Annia Belt, MD    First Contact: Pershing Cox, MS4 Pager: [phone]  Second Contact: Dr. Charlott Rakes Pager: [phone]       After Hours (After 5p/  First Contact Pager: [phone]  weekends / holidays): Second Contact Pager: [phone]   Chief Complaint: Falling over  History of Present Illness: [name] is a 65 year old female with right breast adenocarcinoma [ER/PR negative/HER-2 equivocal] status post lumpectomy/chemotherapy [5-FU/epirubicin/cyclophosphamide and docetaxel/capecitabine]/radiation, CAD h/o MI, hypertension, lumbar spinal stenosis, depression/anxiety who presented to clinic with a 3 week history of generalized weakness and 1 day history of gait instability and lightheadedness.  Her last 3 weeks, she reports an overall sense of feeling weak which she characterizes difficulty sleeping and low energy. During this interval, she also acknowledged intermittent leg cramps, blurry vision, and headaches which she localized the right temple and were associated with tearing of her right eye. The symptoms evolved to nausea yesterday morning upon awakening. Upon bending over to put on her flip-flops, she felt lightheaded and felt herself falling forward. Later that day, she felt unstable and found herself falling to the right side. All watching TV, she experienced a "pressure-like" sensation on her chest which did not radiate and lasted about 20 minutes that was associated with some dyspnea but no diaphoresis. Given her persistent gait instability and other symptoms, he presented to the clinic today for further evaluation.  She reported similar symptoms roughly 5-7 years ago following treatment of her breast cancer though was told it was related  to low potassium which then resolved on its own. While she had chest pain in 2003 prior to her heart attack and underwent catheterization without PCI, her pain yesterday was not the same. She lives at home with her fianc and pet dog here in [place] and used to smoke 3 cigarettes a day of for an interval of 15 years but denies any current alcohol or illicit drug use.  Meds:  No outpatient prescriptions have been marked as taking for the 05/03/16 encounter [facility] Encounter).   Aspirin 81 mg Alprazolam 0.5 mg to be taken 3 times daily as needed Cetirizine 10 mg daily Fluticasone nasal spray 1-2 sprays per nostril daily Cyclosporine 0.05% drops to be taken one drop/eye twice daily Diclofenac 1% gel Fluticasone/salmeterol 250/50 g 1 puff twice daily HCTZ 12.5 mg daily Metoprolol tartrate 25 mg twice daily Valsartan 325 mg daily Oxymetolazone 0.05% nasal spray twice daily as needed for congestion Pantoprazole 40 mg daily Pravastatin 40 mg daily Albuterol inhaler 2 puffs every 4 hours as needed for wheezing   Allergies: Allergies as of 05/03/2016 - Review Complete 05/03/2016  Allergen Reaction Noted  . Amoxicillin Anaphylaxis   . Azithromycin Anaphylaxis 10/03/2007  . Bromfed Anaphylaxis   . Cephalexin Anaphylaxis   . Chlordiazepoxide-clidinium Anaphylaxis   . Clotrimazole Other (See Comments) and Hypertension 05/15/2012  . Iohexol Anaphylaxis and Other (See Comments) 09/19/2007  . Paroxetine Anaphylaxis   . Prednisone Anaphylaxis 06/09/2014  . Propoxyphene n-acetaminophen Anaphylaxis 10/03/2007  . Sertraline hcl Anaphylaxis   . Sulfadiazine Anaphylaxis   . Verapamil Anaphylaxis   . Adhesive [tape] Other (  See Comments) 12/17/2014  . Effexor [venlafaxine] Hives and Itching 02/06/2015  . Gabapentin Other (See Comments) 08/18/2015  . Ibuprofen Nausea And Vomiting 05/13/2011  . Latex Swelling 09/24/2007  . Lisinopril Other (See Comments) 06/09/2014  . Tussionex pennkinetic er  [hydrocod polst-cpm polst er] Itching and Photosensitivity 07/28/2014  . Codeine Rash 04/15/2015  . Dicyclomine hcl Hives 04/07/2011  . Gatifloxacin Other (See Comments) 06/09/2014  . Hydralazine hcl Itching and Rash 06/22/2012  . Lidocaine Hives 05/11/2012  . Penicillins Hives   . Pregabalin Anxiety 06/09/2014   Past Medical History:  Diagnosis Date  . Adenocarcinoma of breast ([place]) 2009   right, s/p chemo/ xrt  . Anal fissure   . Anxiety   . Asthma   . CAD (coronary artery disease)    Nonobstructive on cath 2003 and 2005  . Chronic back pain   . Depression   . Diverticulosis of colon (without mention of hemorrhage)   . Dog bite(E906.0)   . GERD (gastroesophageal reflux disease)   . Headache   . Hiatal hernia   . Hypertension   . Irritable bowel syndrome   . Jaundice    Hx of Jaundice at age 29 from "dirty restuarant". Unsure of Hepatitis type  . Lumbar radiculopathy    bilat LE's  . Neuropathy ([place])    bilat LE's  . Pain management   . Panic attacks   . Spinal stenosis, lumbar region, without neurogenic claudication     Family History: 2 sisters with strokes. Mother currently residing in a nursing facility. Father died of bony metastatic disease from prostate cancer.  Social History: As noted in the history of present illness  Review of Systems: A complete ROS was negative except as per HPI.   Physical Exam: Blood pressure (!) 188/92, pulse 66, temperature 98.2 F (36.8 C), resp. rate 18, height _0  (1.6 m), weight 173 lb 8 oz (78.7 kg), SpO2 100 %. Physical Exam  Constitutional: She is oriented to person, place, and time. No distress.  HENT:  Head: Normocephalic and atraumatic.  Eyes: Conjunctivae and EOM are normal. No scleral icterus.  Cardiovascular: Normal rate and regular rhythm.   Pulmonary/Chest: No respiratory distress.  Abdominal: Soft. She exhibits no distension.  Neurological: She is alert and oriented to person, place, and time. Coordination  (Dysmetria noted on the right as compared to the left. Difficulty with rapid alternating movements of the right hand as compared to the left.) abnormal.  Cranial nerves II through XII intact. 5 out of 5 upper and lower extremity strength bilaterally. Abnormal gait, falling to the right side.   Skin: She is not diaphoretic.  Dermatitis noted underneath the left breast skinfold with some crusting lesions. Papule noted of the LLQ with an erythematous base.   Assessment & Plan by Problem: Principal Problem:   Gait instability Active Problems:   Essential hypertension   Coronary atherosclerosis   Chest pain   Breast cancer of upper-inner quadrant of right female breast (HCC)   GERD (gastroesophageal reflux disease)   Allergic rhinitis   Lumbar arthropathy (HCC)   Rash and nonspecific skin eruption   Dizziness  [name] is a 65 year old female with right breast adenocarcinoma [ER/PR negative/HER-2 equivocal] status post lumpectomy/chemotherapy [5-FU/epirubicin/cyclophosphamide and docetaxel/capecitabine]/radiation, CAD h/o MI, hypertension, lumbar spinal stenosis, depression/anxiety hospitalized for possible CVA.  Possible CVA: Her neurologic exam findings are suspicious for a second lesion of her right cerebellum given the unilateral deficits with coordination and ambulation. She does have risk factors for  CVA including hypertension, obesity, hyperlipidemia, coronary artery disease. May be related to metastatic disease. Vertebrobasilar insufficiency another possibility though acuity of symptom onset is concerning.  -Continue aspirin 81 mg daily -Check carotid Dopplers -Check brain MR/MRA -Consult PT/OT/SLP -Check echo -Check lipid panel/A1c -Consult stroke team pending workup   Atypical chest pain: Prior history of mild coronary artery disease does raise concern for ACS though initial troponin negative. Cardiac catheterization 04/06/2004 without obstructive coronary artery disease.  Possibly related to evolving bronchitis as it has been associated with upper respiratory symptoms.  -Aspirin as noted above -Check EKG, chest x-ray, troponins x 2 -Monitor on telemetry -Continue albuterol nebulizer every 6 hours as needed for wheezing  Skin lesions: Followed by outpatient dermatology.   Hyperlipidemia: Continue pravastatin 40 mg daily.  GERD: Continue pantoprazole 40 mg daily.  Allergic rhinitis Continue loratadine 10 mg daily and flonase 1-2 sprays per nostril daily.  Depression/anxiety: Continue alprazolam 0.5 mg 3 times daily as needed for anxiety.  #FEN:  -Diet: Regular  #DVT prophylaxis: Lovenox  #CODE STATUS: FULL CODE -Defer to Neva Seat [phone] if patients lacks decision-making capacity -Confirmed with patient on admission  Dispo: Admit patient to Inpatient with expected length of stay greater than 2 midnights.  Signed: Riccardo Dubin, MD 05/03/2016, 4:55 PM  Pager: _0 @

## 2016-05-03 NOTE — Assessment & Plan Note (Addendum)
Assessment:  Underneath left breast rash  Patient has had ongoing rash since June 1st.  Rash is underneath her left breast.  It is non-pruritic, painful and erythematous. She has been following with a dermatologist and last saw Dr. Nevada Crane in July. She has been using clobetasol propionate cream.  Does not appear to be candidiasis.     Plan: - appointment was made for next tuesday 8/29th with dermatology.

## 2016-05-03 NOTE — Progress Notes (Signed)
Pt states that she had esophageal stricture done 2 years ago.

## 2016-05-03 NOTE — Assessment & Plan Note (Signed)
Assessment:  Gait instability  Patient has new onset of dizziness that started yesterday. She states she feels off balance when walking across the room. She also endorses muscle cramps and muscle weakness that started 4 days prior to her feelings of dizziness. She denies feeling like the room is spinning and has not had any new medications. She states she has good oral intake,drinking plenty of water. She was not orthostatic on exam today.  She has a history of breast cancer. Today there is concern for possible posterior stroke and with a history of breast cancer there is concern for reocurrence.   Plan: Inpatient admission to rule out stroke and rule out metastasis

## 2016-05-03 NOTE — Patient Instructions (Signed)
Inpatient Admission

## 2016-05-03 NOTE — H&P (Signed)
Date: 05/03/2016               Patient Name:  [name] MRN: [id number]  DOB: 04-Mar-1951 Age / Sex: 65 y.o., female   PCP: Holley Raring, MD              Medical Service: Internal Medicine Teaching Service              Attending Physician: Dr. Annia Belt, MD    First Contact: Pershing Cox, MS4 Pager: [phone]  Second Contact: Dr. Charlott Rakes Pager: [phone]            After Hours (After 5p/  First Contact Pager: [phone]  weekends / holidays): Second Contact Pager: [phone]   Chief Complaint: "weak for the last three weeks"  History of Present Illness: [name] is a 65yo female with a past medical history significant for adenocarcinoma of the breast in 2009 s/p lumpectomy/chemo/RT, CAD, HTN, asthma, chronic back pain, lumbar spinal stenosis, depression and anxiety who presents from [facility] with 3 weeks of generalized weakness and 1 day of gait instability and light-headedness. She states that 3 weeks ago, she started feeling weaker than usual but had no difficulty performing ADLs. In the same time period, she has noticed intermittent blurry vision, cramping in her legs, and sudden sharp headaches in the right temple causing her right eye to water. These headaches have occurred 3 times over the course of 3 weeks, coming on suddenly during the day and diminishing quickly. Yesterday morning, she woke up feeling nauseated. As she was bending over to adjust her flip flops from a seated position, she felt herself falling forward but was able to brace herself. She felt light-headed at the time but was able to stand and continue her day. In the evening however, as she was working outside, she felt unstable on her feet and found herself tipping to the right. She did not experience a traumatic fall. She also experienced a "pressure-like" non-radiating pain in the middle of her chest last night while watching TV, lasting 20 minutes with associated shortness of breath. She denies sweating or  palpitations. Today, she continues to be unsteady on her feet, endorses right arm pain and numbness and a continued mild tightness in her chest (she attributes this to her cough and recent viral symptoms). She has had no recent medication changes. She has experienced similar symptoms of weakness and instability 5-7 years ago after taking chemo for breast cancer. This weakness was attributed to a low potassium level and resolved on its own.  With concern for a possible stroke, [name] was admitted directly to [facility] for further evaluation of her symptoms.  Meds:  Tylenol 500 mg q8h prn   Alprazolam 0.5 mg tid prn  Ascorbic acid 500 mg  Aspirin 81 mg  Calcium carbonate-vitamin D 600-125 mg  Cetirizine 10 mg  Cyclosporine 0.05% ophthalmic emulsion bid  Fluticasone qhs  HCTZ 12.5 mg qday  Metoprolol tartrate 25 mg bid  Oxymetazoline 0.05% nasal spray bid  Pantoprazole 40 mg qday  Phenylephrine-guaifenesin 5 mL q6h prn  Pravastatin 40 mg  Proair bid prn  Valsartan 320 mg  Vitamin E 400U  Voltaren gel  Allergies: Allergies as of 05/03/2016 - Review Complete 05/03/2016  Allergen Reaction Noted  . Amoxicillin Anaphylaxis   . Azithromycin Anaphylaxis 10/03/2007  . Bromfed Anaphylaxis   . Cephalexin Anaphylaxis   . Chlordiazepoxide-clidinium Anaphylaxis   . Clotrimazole Other (See Comments) and Hypertension 05/15/2012  .  Iohexol Anaphylaxis and Other (See Comments) 09/19/2007  . Paroxetine Anaphylaxis   . Prednisone Anaphylaxis 06/09/2014  . Propoxyphene n-acetaminophen Anaphylaxis 10/03/2007  . Sertraline hcl Anaphylaxis   . Sulfadiazine Anaphylaxis   . Verapamil Anaphylaxis   . Adhesive [tape] Other (See Comments) 12/17/2014  . Effexor [venlafaxine] Hives and Itching 02/06/2015  . Gabapentin Other (See Comments) 08/18/2015  . Ibuprofen Nausea And Vomiting 05/13/2011  . Latex Swelling 09/24/2007  . Lisinopril Other (See Comments) 06/09/2014  .  Tussionex pennkinetic er [hydrocod polst-cpm polst er] Itching and Photosensitivity 07/28/2014  . Codeine Rash 04/15/2015  . Dicyclomine hcl Hives 04/07/2011  . Gatifloxacin Other (See Comments) 06/09/2014  . Hydralazine hcl Itching and Rash 06/22/2012  . Lidocaine Hives 05/11/2012  . Penicillins Hives   . Pregabalin Anxiety 06/09/2014   Past Medical History:  Diagnosis Date  . Adenocarcinoma of breast ([place]) 2009   right, s/p chemo/ xrt  . Anal fissure   . Anxiety   . Asthma   . CAD (coronary artery disease)    Nonobstructive on cath 2003 and 2005  . Chronic back pain   . Depression   . Diverticulosis of colon (without mention of hemorrhage)   . Dog bite(E906.0)   . GERD (gastroesophageal reflux disease)   . Headache   . Hiatal hernia   . Hypertension   . Irritable bowel syndrome   . Jaundice    Hx of Jaundice at age 65 from "dirty restuarant". Unsure of Hepatitis type  . Lumbar radiculopathy    bilat LE's  . Neuropathy ([place])    bilat LE's  . Pain management   . Panic attacks   . Spinal stenosis, lumbar region, without neurogenic claudication    Past Surgical History:  Procedure Laterality Date  . BLADDER REPAIR     tact  . BREAST LUMPECTOMY Right   . BREAST RECONSTRUCTION Right   . BREAST REDUCTION SURGERY Left   . CARDIAC CATHETERIZATION  2003, 2005  . CATARACT EXTRACTION    . CHOLECYSTECTOMY    . COLONOSCOPY  2013   Diverticulosis  . ESOPHAGEAL MANOMETRY  10/08/2012   Procedure: ESOPHAGEAL MANOMETRY (EM);  Surgeon: Sable Feil, MD;  Location: [facility];  Service: Endoscopy;  Laterality: N/A;  . ESOPHAGOGASTRODUODENOSCOPY  2014   Normal   . PARTIAL HYSTERECTOMY  1987   Family History: Sister and cousin with history of CVA. Hypertension in mother and father. Father died of prostate cancer.   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 She smoked ~3 cigarettes per day for  roughly 15 years and quit smoking 32 years ago. She denies alcohol and illicit drug use.  Review of Systems: Constitutional: + general malaise, no fevers or chills HEENT: + blurry vision, runny nose.  Respiratory: + cough, shortness of breath. No wheezing or increased sputum production Cardiovascular: + chest pain. No palpitations or edema. Gastrointestinal: + constipation. No abdominal pain or diarrhea.  Integument/breast: + rash. + scarring from breast surgeries. No other lesions. Musculoskeletal:+ pain in arches of feet (worse when standing up in the morning), + pain in R arm, + bilaterally hip and lower back pain. Neurological: + generalized weakness, + falls, + gait instability, + numbness in R arm. No tremor or slurred speech. Allergic/Immunologic: Multiple medication allergies  Physical Exam: BP (!) 188/92 (BP Location: Right Arm)   Pulse 66   Temp 98.2 F (36.8 C)   Resp 18  SpO2 100%  General appearance: alert, cooperative, appears stated age and no distress, tangential thought process. HEENT: Normocephalic, atraumatic. Sclera non-icteric. Nares and oropharynx clear. MMM. No cervical lymphadenopathy. Back: + pain to palpation over sacral iliac crests bilaterally and lumbar spine. Lungs: CTA bilaterally. No wheezes. Heart: RRR, no murmurs, gallops, or rubs Abdomen: NTND. BS+ Extremities: Warm and well-perfused. No edema. Skin: bright, scaly, non-bleeding erythematous rash under the left breast w/o satellite lesions. Small, circular erythematous lesion left abdomen. Neurologic: Right eye slow to abduct on EOM testing. CN II-XII intact with exception of VI. Finger-to-nose test: smooth on left, dysmetria on right. Rapid alternating movement: slower on right. Gait unstable (could not walk without assistance) with left tilt. Sensation grossly normal. Strength 5/5 and symmetric in upper and lower extremities bilaterally.  Lab results: Component     Latest Ref Rng & Units 05/03/2016  05/03/2016        10:40 AM 11:52 AM  Sodium     135 - 145 mmol/L 139 140  Potassium     3.5 - 5.1 mmol/L 3.5 3.4 (L)  Chloride     101 - 111 mmol/L 102 103  CO2     22 - 32 mmol/L 29 31  Glucose     65 - 99 mg/dL 114 (H) 121 (H)  BUN     6 - 20 mg/dL 12 11  Creatinine     0.44 - 1.00 mg/dL 0.80 0.77  Calcium     8.9 - 10.3 mg/dL 10.2 10.4 (H)  Total Protein     6.5 - 8.1 g/dL 7.5   Albumin     3.5 - 5.0 g/dL 4.7   AST     15 - 41 U/L 28   ALT     14 - 54 U/L 21   Alkaline Phosphatase     38 - 126 U/L 59   Total Bilirubin     0.3 - 1.2 mg/dL 1.0   EGFR (Non-African Amer.)     >60 mL/min >60 >60  EGFR (African American)     >60 mL/min >60 >60  Anion gap     5 - _0 WBC     4.0 - 10.5 K/uL 4.7   RBC     3.87 - 5.11 MIL/uL 4.91   Hemoglobin     12.0 - 15.0 g/dL 15.2 (H)   HCT     36.0 - 46.0 % 45.9   MCV     78.0 - 100.0 fL 93.5   MCH     26.0 - 34.0 pg 31.0   MCHC     30.0 - 36.0 g/dL 33.1   RDW     11.5 - 15.5 % 13.3   Platelets     150 - 400 K/uL 217   Troponin I     <0.03 ng/mL <0.03    Imaging results:  No results found.   Assessment & Plan by Problem:  [name] is a 65 yo female with a past medical history significant for adenocarcinoma of the right breast s/p lumpectomy/chemo/RT, CAD, HTN, lumbar spinal stenosis, asthma, anxiety, and depression who presents with gait instability and positive cerebellar signs on exam concerning for a posterior circulation CVA.  Gait instability, positive cerebellar findings: Concerning for cerebellar CVA vs. metastatic breast cancer given history of adenocarcinoma. She also has significant risk factors for stroke including immediate family history of stroke, CAD, HTN, HLD, history of smoking, and age. Her history of light-headedness  is not consistent with vertigo or orthostatic hypotension (orthostatics performed in clinic today and negative) but could certainly be related to a cerebellar process. She is  currently hemodynamically stable.  --MR/MRA head w/o contrast and MR brain w/o contrast pending  --echocardiogram pending  --carotid dopplers pending  --neuro checks q2h x12 h, then q4  --OT/PT/speech evaluation  --A1c pending  --recheck CBC, BMP  Chest pain: One episode of substernal, non-radiating chest pain at rest last night, now mostly resolved with some residual tightness. Some associated shortness of breath but no other symptoms concerning for MI. Patient diagnosed with an anterior MI in 2003 for which she underwent catheterization and removal(?) of the plaque (no stents placed per patient). She states the pain from last night is different than the chest tightness she was having with her heart attack. Given history of MI and other ACS risk factors, we will trend troponins and follow-up EKG.    --maintain on telemetry  --EKG pending  --trend trops x 3. First troponin negative.  Hypertension: Hold home metoprolol, losartan, and HCTZ. Allow for permissive hypertension until CVA ruled out.   Rash under left breast: Painful, non-pruritic, erythematous. Not in dermatomal pattern. Unclear etiology though it does not appear to be candidiasis. She saw a dermatologist who did not biopsy rash. Using clobetasol propionate cream without improvement.   --has appointment with dermatology 8/29  History Adenocarcinoma of right breast: diagnosed Dec 2008. Treated with neo-adjuvant chemotherapy from 1/6-6/22/2009 with FEC x4 followed by dose dense Taxotere w/ Xeloda 1000 mg bid for 8 wks for ER/PR negative HER-2 negative cancer. Underwent lumpectomy 05/06/08, determined to be T2, N0, M0 (stage IIA). She then underwent localized radiation therapy from 9/22-11/10/09.   History of hypercalcemia: Calcium 10.4 on admission. It has been as high as 10.5 in past 7 months. Corrected for albumin, Ca is ~9.8. Work-up in clinic revealed slightly elevated (inappropriately normal) PTH, PTHrp <1.1. 24 hr urine calcium was  92, essentially ruling out [place]. Vit D slightly below normal (patient currently on combination calcium-vit D supplement). Corrected calcium is essentially normal, but patient might have mild primary hyperparathyroidism per lab findings.  Lower extremity pain: Continue voltaren gel, tylenol  CAD, HLD: Per records, patient suffered an anterior MI in 2003. Unclear if stents were placed at that time. Repeat catheterization in 2005 showed stable non-obstructive CAD with 40% LAD lesion and 25% circumflex lesion. Lipid panel 6/17: total 181, LDL 101, HDL 49.  --continue pravastatin  --continue asa 81  --hold ARB and metoprolol until CVA ruled out  Asthma: Continue albuterol inhaler, Advair  Chronic sinusitis/allergic rhinitis: Continue Afrin, Zyrtec, Restasis  GERD: Continue Protonix  Anxiety: Continue home xanax 0.42m tid prn  FEN/GI: Regular diet, neuro swallow study evaluation pending  DVT prophylaxis: Lovenox  Code Status: FULL, confirmed on admission  Disposition: Admit to Inpatient. Anticipate stay of 3-5 days.  This is a MCareers information officerNote.  The care of the patient was discussed with Dr. PPosey Prontoand the assessment and plan was formulated with their assistance.  Please see their note for official documentation of the patient encounter.   Signed: TPershing Cox Medical Student 05/03/2016, 1:20 PM
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
## 2016-05-03 NOTE — Assessment & Plan Note (Addendum)
Assessment: chest pressure Patient states she had a feeling of chest pressure localized to the center of her chest that happened last night for 20 minutes. She has had a catheterization in the past and found to have mild coronary artery disease. She has nitroglycerin tablets available but did not have to take one yesterday. She states that the pressure resolved on its own. She also reports right arm pain prior to the chest pressure. She states that the pressure is not associated with activity or after eating.    Plan: Inpatient admission -CBC -CMP -Troponins

## 2016-05-03 NOTE — Progress Notes (Signed)
CC: Dizziness  HPI:  [name] is a 65 y.o. female with past medical history noted below presents to [facility] for dizziness that started yesterday morning. She states she was walking across the room ready to iron some clothes and felt off balance, feeling like she was going to fall forward.  Four days prior she started having muscle cramps and muscle weakness in her lower extremities bilaterally.  She also endorses chronic low back pain. Sensation of being off balance happened twice yesterday. She reports that these episodes last for about 5 minutes. She states that when she tries to walk tandem she leans to one side. She denies feeling like the room is spinning. She also reports having chest pressure located to the center of her chest that lasted 20 minutes yesterday.  She has a history coronary artery disease and has nitroglycerin tablets however she did not take it yesterday.  She said the episode of chest pressure resolved on its own. She denies starting any new medications.  She has a history of breast cancer and follows with oncology once a year in September.  She also notes a rash underneath her left breast. She has been seeing a dermatologist and last saw him in July. She expresses a need for follow-up appointment to reassess her rash.  Past Medical History:  Diagnosis Date  . Adenocarcinoma of breast ([place]) 2009   right, s/p chemo/ xrt  . Anal fissure   . Anxiety   . Asthma   . CAD (coronary artery disease)    Nonobstructive on cath 2003 and 2005  . Chronic back pain   . Depression   . Diverticulosis of colon (without mention of hemorrhage)   . Dog bite(E906.0)   . GERD (gastroesophageal reflux disease)   . Headache   . Hiatal hernia   . Hypertension   . Irritable bowel syndrome   . Jaundice    Hx of Jaundice at age 57 from "dirty restuarant". Unsure of Hepatitis type  . Lumbar radiculopathy    bilat LE's  . Neuropathy ([place])    bilat LE's  . Pain management   . Panic  attacks   . Spinal stenosis, lumbar region, without neurogenic claudication     Review of Systems:  Review of Systems  HENT: Negative for sore throat.   Eyes: Positive for blurred vision.  Gastrointestinal: Positive for nausea. Negative for vomiting.  Genitourinary: Negative for dysuria and urgency.  Musculoskeletal: Positive for back pain and myalgias. Negative for falls and joint pain.  Skin: Positive for rash.  Neurological: Positive for dizziness and weakness. Negative for headaches.     Physical Exam:  Vitals:   05/03/16 0838  BP: (!) 169/87  Pulse: 81  Temp: 98.2 F (36.8 C)  TempSrc: Oral  SpO2: 100%  Weight: 173 lb 6.4 oz (78.7 kg)  Height: 5\' 3"  (1.6 m)   Physical Exam  Constitutional: She is well-developed, well-nourished, and in no distress.  HENT:  Head: Normocephalic and atraumatic.  Cardiovascular: Normal rate and regular rhythm.   Pulmonary/Chest: Effort normal and breath sounds normal.  Abdominal: Soft. There is no tenderness.  Musculoskeletal: She exhibits no edema.  Neurological:  Cn II-XII intact Motor strength 5/5 bilaterally in upper and lower extremities Unable to walk tandem heel to toe as she looses balance  Skin: Skin is warm and dry.  Rash noted under left breast    Assessment & Plan:   See encounters tab for problem based medical decision making.  Patient seen with Dr. Lynnae January

## 2016-05-04 ENCOUNTER — Ambulatory Visit (HOSPITAL_COMMUNITY): Payer: Medicare Other

## 2016-05-04 DIAGNOSIS — R42 Dizziness and giddiness: Secondary | ICD-10-CM

## 2016-05-04 DIAGNOSIS — L989 Disorder of the skin and subcutaneous tissue, unspecified: Secondary | ICD-10-CM

## 2016-05-04 DIAGNOSIS — Z853 Personal history of malignant neoplasm of breast: Secondary | ICD-10-CM

## 2016-05-04 DIAGNOSIS — F419 Anxiety disorder, unspecified: Secondary | ICD-10-CM

## 2016-05-04 DIAGNOSIS — H5509 Other forms of nystagmus: Secondary | ICD-10-CM

## 2016-05-04 DIAGNOSIS — R0789 Other chest pain: Secondary | ICD-10-CM

## 2016-05-04 DIAGNOSIS — J309 Allergic rhinitis, unspecified: Secondary | ICD-10-CM

## 2016-05-04 DIAGNOSIS — K219 Gastro-esophageal reflux disease without esophagitis: Secondary | ICD-10-CM

## 2016-05-04 DIAGNOSIS — I6789 Other cerebrovascular disease: Secondary | ICD-10-CM

## 2016-05-04 DIAGNOSIS — E785 Hyperlipidemia, unspecified: Secondary | ICD-10-CM

## 2016-05-04 DIAGNOSIS — R2681 Unsteadiness on feet: Secondary | ICD-10-CM

## 2016-05-04 LAB — ECHOCARDIOGRAM COMPLETE
FS: 43 % (ref 28–44)
Height: 63 in
IV/PV OW: 1.08
LA diam end sys: 25 mm
LADIAMINDEX: 1.37 cm/m2
LASIZE: 25 mm
LAVOL: 48.2 mL
LAVOLA4C: 44 mL
LAVOLIN: 26.5 mL/m2
LDCA: 3.46 cm2
LV PW d: 11.7 mm — AB (ref 0.6–1.1)
LVELAT: 7.51 cm/s
LVOTD: 21 mm
TDI e' lateral: 7.51
Weight: 2776 oz

## 2016-05-04 LAB — BASIC METABOLIC PANEL
Anion gap: 6 (ref 5–15)
BUN: 12 mg/dL (ref 6–20)
CALCIUM: 9.5 mg/dL (ref 8.9–10.3)
CO2: 30 mmol/L (ref 22–32)
Chloride: 102 mmol/L (ref 101–111)
Creatinine, Ser: 0.82 mg/dL (ref 0.44–1.00)
GFR calc Af Amer: >60 mL/min (ref >60)
GLUCOSE: 119 mg/dL — AB (ref 65–99)
Potassium: 2.9 mmol/L — ABNORMAL LOW (ref 3.5–5.1)
Sodium: 138 mmol/L (ref 135–145)

## 2016-05-04 LAB — TROPONIN I

## 2016-05-04 LAB — TSH: TSH: 1.94 u[IU]/mL (ref 0.350–4.500)

## 2016-05-04 LAB — MAGNESIUM: Magnesium: 1.8 mg/dL (ref 1.7–2.4)

## 2016-05-04 MED ORDER — POTASSIUM CHLORIDE CRYS ER 20 MEQ PO TBCR
40.0000 meq | EXTENDED_RELEASE_TABLET | Freq: Two times a day (BID) | ORAL | Status: DC
  Administered 2016-05-04 – 2016-05-05 (×3): 40 meq via ORAL
  Filled 2016-05-04 (×3): qty 2

## 2016-05-04 MED ORDER — PERFLUTREN LIPID MICROSPHERE
INTRAVENOUS | Status: AC
  Filled 2016-05-04: qty 10

## 2016-05-04 MED ORDER — METOPROLOL TARTRATE 25 MG PO TABS
25.0000 mg | ORAL_TABLET | Freq: Two times a day (BID) | ORAL | Status: DC
  Administered 2016-05-04 – 2016-05-05 (×3): 25 mg via ORAL
  Filled 2016-05-04 (×3): qty 1

## 2016-05-04 MED ORDER — MAGNESIUM SULFATE 2 GM/50ML IV SOLN
2.0000 g | Freq: Once | INTRAVENOUS | Status: AC
  Administered 2016-05-04: 2 g via INTRAVENOUS
  Filled 2016-05-04: qty 50

## 2016-05-04 MED ORDER — PERFLUTREN LIPID MICROSPHERE
1.0000 mL | INTRAVENOUS | Status: AC | PRN
  Administered 2016-05-04: 2 mL via INTRAVENOUS
  Filled 2016-05-04: qty 10

## 2016-05-04 MED ORDER — POTASSIUM CHLORIDE IN NACL 40-0.9 MEQ/L-% IV SOLN
INTRAVENOUS | Status: AC
  Administered 2016-05-04: 75 mL/h via INTRAVENOUS
  Filled 2016-05-04 (×3): qty 1000

## 2016-05-04 NOTE — Evaluation (Signed)
Physical Therapy Evaluation Patient Details Name: [name] MRN: [id number] DOB: 03-01-1951 Today's Date: 05/04/2016   History of Present Illness  65yo female with a past medical history significant for adenocarcinoma of the breast in 2009 s/p lumpectomy/chemo/RT, CAD, HTN, asthma, chronic back pain, lumbar spinal stenosis, depression and anxiety who presents from [facility] with 3 weeks of generalized weakness and 1 day of gait instability and light-headedness. MRI on 8/22 neg for acute intracranial abnormality.   Clinical Impression  Pt admitted with/for generalized weakness and gait instability.  Pt currently limited functionally due to the problems listed below.  (see problems list.)  Pt will benefit from PT to maximize function and safety to be able to get home safely with available assist of family.     Follow Up Recommendations Home health PT    Equipment Recommendations  None recommended by PT    Recommendations for Other Services       Precautions / Restrictions Precautions Precautions: Fall Restrictions Weight Bearing Restrictions: No      Mobility  Bed Mobility                  Transfers Overall transfer level: Needs assistance Equipment used: None Transfers: Sit to/from Stand Sit to Stand: Supervision         General transfer comment: Supervision for safety. No physical assist required.  Ambulation/Gait Ambulation/Gait assistance: Min guard Ambulation Distance (Feet): 150 Feet Assistive device: Rolling walker (2 wheeled);None;1 person hand held assist Gait Pattern/deviations: Step-through pattern Gait velocity: slower Gait velocity interpretation: Below normal speed for age/gender General Gait Details: Initially good heel/toe, then with fatigue more shuffle right vs left.  Stairs Stairs: Yes Stairs assistance: Min guard Stair Management: One rail Right;Step to pattern Number of Stairs: 2 General stair comments: generally  Wheelchair  Mobility    Modified Rankin (Stroke Patients Only)       Balance     Sitting balance-Leahy Scale: Normal       Standing balance-Leahy Scale: Good                               Pertinent Vitals/Pain Pain Assessment: Faces Pain Location: back Pain Descriptors / Indicators: Aching Pain Intervention(s): Monitored during session;Repositioned    Home Living Family/patient expects to be discharged to:: Private residence Living Arrangements: Spouse/significant other Available Help at Discharge: Family;Available PRN/intermittently Type of Home: House Home Access: Stairs to enter   Entrance Stairs-Number of Steps: 1+1 Home Layout: One level Home Equipment: None      Prior Function Level of Independence: Independent               Hand Dominance        Extremity/Trunk Assessment   Upper Extremity Assessment: Defer to OT evaluation           Lower Extremity Assessment: Generalized weakness (generally equally weak proximally L and R side)      Cervical / Trunk Assessment: Normal  Communication   Communication: No difficulties  Cognition Arousal/Alertness: Awake/alert Behavior During Therapy: WFL for tasks assessed/performed Overall Cognitive Status: Within Functional Limits for tasks assessed                      General Comments      Exercises        Assessment/Plan    PT Assessment Patient needs continued PT services  PT Diagnosis Abnormality of gait;Generalized weakness   PT  Problem List Decreased strength;Decreased activity tolerance;Decreased balance;Decreased mobility;Decreased knowledge of use of DME;Pain  PT Treatment Interventions DME instruction;Gait training;Functional mobility training;Therapeutic activities;Balance training;Patient/family education   PT Goals (Current goals can be found in the Care Plan section) Acute Rehab PT Goals Patient Stated Goal: get back to being independent PT Goal Formulation: With  patient Time For Goal Achievement: 05/11/16 Potential to Achieve Goals: Good    Frequency Min 3X/week   Barriers to discharge Decreased caregiver support fiance and friends work or are close by.    Co-evaluation               End of Session   Activity Tolerance: Patient limited by fatigue;Patient tolerated treatment well Patient left: in chair;with call bell/phone within reach;with nursing/sitter in room Nurse Communication: Mobility status         Time: [id number] PT Time Calculation (min) (ACUTE ONLY): 24 min   Charges:   PT Evaluation $PT Eval Moderate Complexity: 1 Procedure PT Treatments $Gait Training: 8-22 mins   PT G Codes:        [place], Tessie Fass 05/04/2016, 4:06 PM 05/04/2016  Donnella Sham, PT [phone]  (pager)

## 2016-05-04 NOTE — Progress Notes (Signed)
   Subjective: This morning, she continues to complain of unsteadiness on her feet. We reviewed with her the findings of her neuroimaging which were reassuring for no signs of an acute stroke. She was relieved to hear that.  Objective:  Vital signs in last 24 hours: Vitals:   05/04/16 0025 05/04/16 0210 05/04/16 0500 05/04/16 1018  BP: 131/75 116/73 124/69 138/86  Pulse: 74 73 73 (!) 107  Resp: _0 Temp:   97.8 F (36.6 C) 98.5 F (36.9 C)  TempSrc:   Oral Oral  SpO2:   97% 97%  Weight:      Height:       Physical Exam  Constitutional: She is oriented to person, place, and time. No distress.  HENT:  Head: Normocephalic and atraumatic.  Eyes: Conjunctivae are normal. No scleral icterus.  Some nystagmus noted with testing of extraocular muscles.  Cardiovascular: Normal rate and regular rhythm.   Pulmonary/Chest: Effort normal. No respiratory distress.  Neurological: She is alert and oriented to person, place, and time. No cranial nerve deficit. Coordination normal.  Skin: She is not diaphoretic.   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  Gait instability: Neurologic exam are notable for improvements in coordination bedside exam today. Review of brain MRI and MRA reassuring for no signs of acute ischemic event or mass lesion suggestive of metastatic disease.  Other differential possibilities include vertebrobasilar insufficiency or worsening of underlying lumbar arthropathy. Potassium 2.8 this morning does raise concern for hypokalemic periodic paralysis though TSH reassuring for no thyrotoxicosis. -Follow-up PT/OT -Speak with Neurology -Start K-Dur 40 mg twice daily and give magnesium 2 g IV  Atypical chest pain: Troponins 3 and EKG findings reassuring for no acute coronary syndrome. -Continue aspirin 81 mg -Monitor on telemetry -Continue albuterol nebulizer every 6 hours needed for wheezing -Follow-up echo -Follow-up lipid panel/A1c  Skin lesions: Followed by outpatient dermatology.  Hyperlipidemia: Continue pravastatin 40 mg daily.  GERD: Continue pantoprazole 40 mg daily.  Allergic rhinitis: Continue loratadine 10 mg daily and fluticasone 1-2 sprays per nostril daily.  Depression/anxiety: Continue alprazolam 0.5 mg to be taken 3 times daily as needed for anxiety.  Dispo: Anticipated discharge in approximately 1-2 days.   Riccardo Dubin, MD 05/04/2016, 11:29 AM Pager: _1 @

## 2016-05-04 NOTE — Progress Notes (Signed)
Subjective: [name] continues to feel unsteady on her feet though light-headedness has cleared. She was relieved to hear that no signs of a major stroke or cancer metastases were identified on her head imaging.  Objective: Vital signs in last 24 hours: Vitals:   05/04/16 0025 05/04/16 0210 05/04/16 0500 05/04/16 1018  BP: 131/75 116/73 124/69 138/86  Pulse: 74 73 73 (!) 107  Resp: _0 Temp:   97.8 F (36.6 C) 98.5 F (36.9 C)  TempSrc:   Oral Oral  SpO2:   97% 97%  Weight:      Height:       Weight change:  No intake or output data in the 24 hours ending 05/04/16 1121  Physical Exam: General: alert, cooperative, NAD. HEENT: Normocephalic, atraumatic. Sclera non-icteric. Nares and oropharynx clear. MMM. No cervical lymphadenopathy. Lungs: CTA bilaterally. No wheezes. Heart: RRR, no murmurs, gallops, or rubs Abdomen: NTND. BS+ Extremities: Warm and well-perfused. No edema. Neurologic: Mild nystagmus in right eye with abduction. CN II-XII grossly intact. Finger-to-nose test: smooth bilaterally. Rapid alternating movement: normal bilaterally. Did not assess gait.Strength 5/5 and symmetric in upper and lower extremities bilaterally.  Lab Results: Component     Latest Ref Rng & Units 05/03/2016 05/03/2016 05/04/2016        10:40 AM  7:25 PM   Sodium     135 - 145 mmol/L   138  Potassium     3.5 - 5.1 mmol/L   2.9 (L)  Chloride     101 - 111 mmol/L   102  CO2     22 - 32 mmol/L   30  Glucose     65 - 99 mg/dL   119 (H)  BUN     6 - 20 mg/dL   12  Creatinine     0.44 - 1.00 mg/dL   0.82  Calcium     8.9 - 10.3 mg/dL   9.5  EGFR (Non-African Amer.)     >60 mL/min   >60  EGFR (African American)     >60 mL/min   >60  Anion gap     5 - 15   6  Troponin I     <0.03 ng/mL <0.03 <0.03 <0.03  Magnesium     1.7 - 2.4 mg/dL   1.8  TSH     0.350 - 4.500 uIU/mL   1.940   Studies/Results: Dg Chest 2 View  Result Date: 05/04/2016 CLINICAL DATA:  Mid chest pain  tonight. EXAM: CHEST  2 VIEW COMPARISON:  09/08/2015 FINDINGS: The cardiomediastinal contours are normal. Calcified granuloma in the right lower lobe, partially obscured. Pulmonary vasculature is normal. No consolidation, pleural effusion, or pneumothorax. No acute osseous abnormalities are seen. IMPRESSION: No active cardiopulmonary disease. Electronically Signed   By: Jeb Levering M.D.   On: 05/04/2016 03:16   Mr Virgel Paling EX Contrast  Result Date: 05/03/2016 CLINICAL DATA:  65 y/o F; history of adenocarcinoma of the breast treated with lumpectomy, chemo, and radiation presents with 3 weeks of generalized weakness and 1 day with gait instability with lightheadedness. EXAM: MRI HEAD WITHOUT CONTRAST MRA HEAD WITHOUT CONTRAST TECHNIQUE: Multiplanar, multiecho pulse sequences of the brain and surrounding structures were obtained without intravenous contrast. Angiographic images of the head were obtained using MRA technique without contrast. COMPARISON:  CT of the head dated 08/06/2014. MRI of the brain dated 02/26/2009. FINDINGS: MRI HEAD FINDINGS Brain: No diffusion restriction to suggest acute infarct. No abnormal susceptibility  hypointensity to indicate intracranial hemorrhage. No focal mass effect. There are a few scattered foci of T2 FLAIR hyperintense signal abnormality in subcortical and periventricular white matter there are mildly progressed from 2010 and probably represent chronic microvascular ischemic changes. Extra-axial space: No hydrocephalus. No midline shift. No effacement of basilar cisterns. No extra-axial collection is identified. Proximal intracranial flow voids are maintained. No abnormality of the cervical medullary junction. Other: Mild ethmoid sinus mucosal thickening. No abnormal signal of the mastoid air cells. Orbits are unremarkable. Calvarium is unremarkable. MRA HEAD FINDINGS Anterior circulation: Bilateral middle cerebral arteries and anterior cerebral arteries are patent. No  occlusion, aneurysm, dissection, or significant stenosis is identified. Posterior circulation: Bilateral vertebral arteries and the basilar artery are patent. No occlusion, aneurysm, dissection, or significant stenosis is identified. Anatomic variant: Bilateral fetal posterior cerebral arteries. Hypoplastic P1 segments. Patent anterior communicating artery. IMPRESSION: 1. No acute intracranial abnormality is identified. 2. Nonspecific foci of T2 FLAIR hyperintensity in subcortical and periventricular white matter are mildly progressed from 2010 and probably represent chronic microvascular ischemic changes. 3. No occlusion, aneurysm, dissection, or significant stenosis of the circle of Willis is identified. Bilateral fetal posterior cerebral arteries, normal variant. Electronically Signed   By: Kristine Garbe M.D.   On: 05/03/2016 23:24   Mr Brain Wo Contrast  Result Date: 05/03/2016 CLINICAL DATA:  65 y/o F; history of adenocarcinoma of the breast treated with lumpectomy, chemo, and radiation presents with 3 weeks of generalized weakness and 1 day with gait instability with lightheadedness. EXAM: MRI HEAD WITHOUT CONTRAST MRA HEAD WITHOUT CONTRAST TECHNIQUE: Multiplanar, multiecho pulse sequences of the brain and surrounding structures were obtained without intravenous contrast. Angiographic images of the head were obtained using MRA technique without contrast. COMPARISON:  CT of the head dated 08/06/2014. MRI of the brain dated 02/26/2009. FINDINGS: MRI HEAD FINDINGS Brain: No diffusion restriction to suggest acute infarct. No abnormal susceptibility hypointensity to indicate intracranial hemorrhage. No focal mass effect. There are a few scattered foci of T2 FLAIR hyperintense signal abnormality in subcortical and periventricular white matter there are mildly progressed from 2010 and probably represent chronic microvascular ischemic changes. Extra-axial space: No hydrocephalus. No midline shift. No  effacement of basilar cisterns. No extra-axial collection is identified. Proximal intracranial flow voids are maintained. No abnormality of the cervical medullary junction. Other: Mild ethmoid sinus mucosal thickening. No abnormal signal of the mastoid air cells. Orbits are unremarkable. Calvarium is unremarkable. MRA HEAD FINDINGS Anterior circulation: Bilateral middle cerebral arteries and anterior cerebral arteries are patent. No occlusion, aneurysm, dissection, or significant stenosis is identified. Posterior circulation: Bilateral vertebral arteries and the basilar artery are patent. No occlusion, aneurysm, dissection, or significant stenosis is identified. Anatomic variant: Bilateral fetal posterior cerebral arteries. Hypoplastic P1 segments. Patent anterior communicating artery. IMPRESSION: 1. No acute intracranial abnormality is identified. 2. Nonspecific foci of T2 FLAIR hyperintensity in subcortical and periventricular white matter are mildly progressed from 2010 and probably represent chronic microvascular ischemic changes. 3. No occlusion, aneurysm, dissection, or significant stenosis of the circle of Willis is identified. Bilateral fetal posterior cerebral arteries, normal variant. Electronically Signed   By: Kristine Garbe M.D.   On: 05/03/2016 23:24   Medications:  Scheduled Meds: . aspirin EC  81 mg Oral Daily  . cycloSPORINE  1 drop Both Eyes BID  . diclofenac sodium  4 g Topical QID  . enoxaparin (LOVENOX) injection  40 mg Subcutaneous Q24H  . loratadine  10 mg Oral Daily  . magnesium sulfate 1 -  4 g bolus IVPB  2 g Intravenous Once  . pantoprazole  40 mg Oral Daily  . potassium chloride  40 mEq Oral BID  . pravastatin  40 mg Oral QPM   Continuous Infusions:  PRN Meds:.acetaminophen, albuterol, ALPRAZolam, oxymetazoline, perflutren lipid microspheres (DEFINITY) IV suspension   Assessment/Plan: [name] is a 65 yo female with a past medical history significant for  adenocarcinoma of the right breast s/p lumpectomy/chemo/RT, CAD, HTN, lumbar spinal stenosis, asthma, anxiety, and depression who presents with gait instability and positive cerebellar signs on exam concerning for a posterior circulation CVA vs. metastatic adenocarcinoma.  Gait instability, positive cerebellar findings: Cerebellar signs normalized today with some slight nystagmus in the right eye with abduction. MR/MRA head show no acute abnormalities concerning for major stroke or metastases. Echo and carotid dopplers show no thrombus or significant stenosis, respectively. She continues to endorse gait instability. OT evaluated and found her to have mild unsteadiness but no loss of balance. Etiology of her gait instability and prolonged weakness remains unclear. She could certainly have suffered a minor stroke without significant brain ischemia. With her recent URI symptoms, a post-viral cerebellar ataxia is also a possibility. She endorses similar symptoms with hypokalemia in the past. Potassium was 2.9 here on recheck and is being repleted. Hypokalemic periodic paralysis is a third possible etiology given improvement in exam with repletion. Discussed with neurology: she would need outpatient evaluation for this if gait does not improve. We could start acetazolamide empirically if needed as this is the standard treatment.  --hold HCTZ for hypokalemia  --iv NS + KCl 40 mEq @ 75 mL/hr for 24 hrs  --PT eval pending   --A1c pending  Chest pain: One episode of substernal, non-radiating chest pain at rest on 8/21 with some residual tightness with ambulation. Some associated shortness of breath but no other symptoms concerning for MI. Patient diagnosed with an anterior MI in 2003 for which she underwent catheterization w/o stenting. She states the pain 8/21 is different than the chest tightness she was having with her heart attack. Trops negative x 3, EKG shows chronic q waves from previous MI but no acute  changes.                        --cardiac monitoring  Hypertension: Normotensive. Held meds for permissive hypertension overnight. MR/MRA head reassuring for no hemorrhagic process. Restarted metoprolol. Continuing to hold losartan for normal pressures. Holding HCTZ for hypokalemia.  Rash under left breast: Painful, non-pruritic, erythematous. Not in dermatomal pattern. Unclear etiology though it does not appear to be candidiasis. She saw a dermatologist who did not biopsy rash. Using clobetasol propionate cream without improvement.                        --has appointment with dermatology 8/29  History Adenocarcinoma of right breast: diagnosed Dec 2008. Treated with neo-adjuvant chemotherapy from 1/6-6/22/2009 with FEC x4 followed by dose dense Taxotere w/ Xeloda 1000 mg bid for 8 wks for ER/PR negative HER-2 negative cancer. Underwent lumpectomy 05/06/08, determined to be T2, N0, M0 (stage IIA). She then underwent localized radiation therapy from 9/22-11/10/09.   History of hypercalcemia: Calcium 10.4 on admission, now 9.5. It has been as high as 10.5 in past 7 months. Corrected for albumin, Ca is ~9.8. Work-up in clinic revealed slightly elevated (inappropriately normal) PTH, PTHrp <1.1. 24 hr urine calcium was 92, essentially ruling out [place]. Vit D slightly below normal (  patient currently on combination calcium-vit D supplement). Corrected calcium is essentially normal, but patient might have mild primary hyperparathyroidism per lab findings.  Lower extremity pain: Continue voltaren gel, tylenol  CAD, HLD: Per records, patient suffered an anterior MI in 2003, no stenting. Repeat catheterization in 2005 showed stable non-obstructive CAD with 40% LAD lesion and 25% circumflex lesion. Lipid panel 6/17: total 181, LDL 101, HDL 49.                       --continue pravastatin                       --continue asa 81            --restarted metoprolol                       --holding ARB since  normotensive  Asthma: Continue albuterol inhaler, Advair  Chronic sinusitis/allergic rhinitis: Continue Afrin, Zyrtec, Restasis  GERD: Continue Protonix  Anxiety: Xanax 0.5 prn (up to tid)  FEN/GI: Regular diet  DVT prophylaxis: Lovenox  Code Status: FULL, confirmed on admission  Disposition: Admit to Inpatient. Anticipate stay of 1-3 days.  This is a Careers information officer Note.  The care of the patient was discussed with Dr. Posey Pronto and the assessment and plan formulated with their assistance.  Please see their attached note for official documentation of the daily encounter.   LOS: 1 day   Pershing Cox, Medical Student 05/04/2016, 11:21 AM

## 2016-05-04 NOTE — Care Management Note (Signed)
Case Management Note  Patient Details  Name: [name] MRN: [id number] Date of Birth: 17-Jun-1951  Subjective/Objective:   Pt in with gait instability. MRI results negative. She is from home with her fiancee.                  Action/Plan: Awaiting PT/OT recommendations. CM following for d/c needs.   Expected Discharge Date:                  Expected Discharge Plan:     In-House Referral:     Discharge planning Services     Post Acute Care Choice:    Choice offered to:     DME Arranged:    DME Agency:     HH Arranged:    HH Agency:     Status of Service:  In process, will continue to follow  If discussed at Long Length of Stay Meetings, dates discussed:    Additional Comments:  Pollie Friar, RN 05/04/2016, 11:32 AM

## 2016-05-04 NOTE — Progress Notes (Signed)
Internal Medicine Clinic Attending  I saw and evaluated the patient.  I personally confirmed the key portions of the history and exam documented by Dr. Hoffman and I reviewed pertinent patient test results.  The assessment, diagnosis, and plan were formulated together and I agree with the documentation in the resident's note.      

## 2016-05-04 NOTE — Progress Notes (Signed)
  Echocardiogram 2D Echocardiogram with Definity has been performed.  [name] 05/04/2016, 10:16 AM

## 2016-05-04 NOTE — Progress Notes (Signed)
Pt IV access removed. Reported after therapy ambulation that site was leaking. Assessment done noted leaking, catheter in place. Pt denies pain or discomfort. Dry dressing applied. Attempted to restart but unsuccessful. IV team consul order in place.

## 2016-05-04 NOTE — Evaluation (Signed)
Occupational Therapy Evaluation and Discharge Patient Details Name: [name] MRN: [id number] DOB: 1950-11-17 Today's Date: 05/04/2016    History of Present Illness 65yo female with a past medical history significant for adenocarcinoma of the breast in 2009 s/p lumpectomy/chemo/RT, CAD, HTN, asthma, chronic back pain, lumbar spinal stenosis, depression and anxiety who presents from [facility] with 3 weeks of generalized weakness and 1 day of gait instability and light-headedness. MRI on 8/22 neg for acute intracranial abnormality.    Clinical Impression   Pt reports she was independent with ADL PTA. Currently pt overall supervision for safety with ADL and functional mobility. Pt presenting with mild unsteadiness during functional activities in session but no LOB noted. Feel pt would benefit from use of 3 in 1 in shower as a seat for home safety and fall prevention. At end of session, pt c/o tightness in center of chest; SpO2=97 on RA, HR up to 117 during activity (RN notified). Pt planning to d/c home with intermittent supervision from her fiance. No further acute OT needs identified; signing off at this time. Please re-consult if needs change. Thank you for this referral.    Follow Up Recommendations  No OT follow up;Supervision - Intermittent    Equipment Recommendations  3 in 1 bedside comode    Recommendations for Other Services PT consult     Precautions / Restrictions Restrictions Weight Bearing Restrictions: No      Mobility Bed Mobility Overal bed mobility: Modified Independent             General bed mobility comments: HOB elevated and increased time required. No c/o dizziness with positional changes.  Transfers Overall transfer level: Needs assistance Equipment used: None Transfers: Sit to/from Stand Sit to Stand: Supervision         General transfer comment: Supervision for safety. No physical assist required.    Balance Overall balance assessment: Needs  assistance Sitting-balance support: Feet supported;No upper extremity supported Sitting balance-Leahy Scale: Normal     Standing balance support: No upper extremity supported;During functional activity Standing balance-Leahy Scale: Good                              ADL Overall ADL's : Needs assistance/impaired Eating/Feeding: Modified independent;Sitting   Grooming: Supervision/safety;Standing;Oral care   Upper Body Bathing: Set up;Sitting   Lower Body Bathing: Set up;Supervison/ safety;Sit to/from stand   Upper Body Dressing : Set up;Sitting   Lower Body Dressing: Set up;Supervision/safety;Sit to/from stand   Toilet Transfer: Supervision/safety;Ambulation;Regular Toilet   Toileting- Water quality scientist and Hygiene: Supervision/safety;Sit to/from stand   Tub/ Shower Transfer: Supervision/safety;Walk-in shower;Ambulation;3 in 1 Tub/Shower Transfer Details (indicate cue type and reason): Educated pt on use of 3 in 1 in shower as a seat for safety. Functional mobility during ADLs: Supervision/safety General ADL Comments: Pt mildly unsteady on feet but with no major LOB during functional activities in session. Pt able to perform higher level balance activities during ADL with only supervision for safety; no physical assist required. Pt c/o tightness in chest after activities; RN notified. SpO2=97 on RA following activity. HR up to 117 during activity.     Vision Vision Assessment?: No apparent visual deficits   Perception     Praxis      Pertinent Vitals/Pain Pain Assessment: Faces Faces Pain Scale: Hurts even more Pain Location: L leg and back Pain Descriptors / Indicators: Aching Pain Intervention(s): Monitored during session;Repositioned;Patient requesting pain meds-RN notified  Hand Dominance     Extremity/Trunk Assessment Upper Extremity Assessment Upper Extremity Assessment: Overall WFL for tasks assessed   Lower Extremity Assessment Lower  Extremity Assessment: Defer to PT evaluation   Cervical / Trunk Assessment Cervical / Trunk Assessment: Normal   Communication Communication Communication: No difficulties   Cognition Arousal/Alertness: Awake/alert Behavior During Therapy: WFL for tasks assessed/performed Overall Cognitive Status: Within Functional Limits for tasks assessed                     General Comments       Exercises       Shoulder Instructions      Home Living Family/patient expects to be discharged to:: Private residence Living Arrangements: Spouse/significant other Available Help at Discharge: Family;Available PRN/intermittently Type of Home: House Home Access: Stairs to enter [employer] of Steps: 1+1   Home Layout: One level     Bathroom Shower/Tub: Occupational psychologist: Handicapped height     Home Equipment: None          Prior Functioning/Environment Level of Independence: Independent             OT Diagnosis: Generalized weakness   OT Problem List:     OT Treatment/Interventions:      OT Goals(Current goals can be found in the care plan section) Acute Rehab OT Goals Patient Stated Goal: get back to being independent OT Goal Formulation: All assessment and education complete, DC therapy  OT Frequency:     Barriers to D/C:            Co-evaluation              End of Session Nurse Communication: Mobility status;Other (comment);Patient requests pain meds (chest tightness)  Activity Tolerance: Patient tolerated treatment well Patient left: in chair;with call bell/phone within reach;with chair alarm set   Time: [id number] OT Time Calculation (min): 24 min Charges:  OT General Charges $OT Visit: 1 Procedure OT Evaluation $OT Eval Moderate Complexity: 1 Procedure OT Treatments $Self Care/Home Management : 8-22 mins G-Codes:     Binnie Kand M.S., OTR/L PagerJN:[phone]  05/04/2016, 12:08 PM

## 2016-05-05 ENCOUNTER — Telehealth: Payer: Self-pay | Admitting: Internal Medicine

## 2016-05-05 ENCOUNTER — Encounter (HOSPITAL_COMMUNITY): Payer: Self-pay | Admitting: *Deleted

## 2016-05-05 LAB — BASIC METABOLIC PANEL
Anion gap: 3 — ABNORMAL LOW (ref 5–15)
BUN: 16 mg/dL (ref 6–20)
CHLORIDE: 109 mmol/L (ref 101–111)
CO2: 27 mmol/L (ref 22–32)
CREATININE: 0.81 mg/dL (ref 0.44–1.00)
Calcium: 9 mg/dL (ref 8.9–10.3)
Glucose, Bld: 123 mg/dL — ABNORMAL HIGH (ref 65–99)
Potassium: 4.1 mmol/L (ref 3.5–5.1)
SODIUM: 139 mmol/L (ref 135–145)

## 2016-05-05 LAB — HEMOGLOBIN A1C
Hgb A1c MFr Bld: 6.1 % — ABNORMAL HIGH (ref 4.8–5.6)
MEAN PLASMA GLUCOSE: 128 mg/dL

## 2016-05-05 LAB — MAGNESIUM: MAGNESIUM: 2.1 mg/dL (ref 1.7–2.4)

## 2016-05-05 MED ORDER — GUAIFENESIN-DM 100-10 MG/5ML PO SYRP: 5.0000 mL | ORAL_SOLUTION | ORAL | Status: DC | PRN

## 2016-05-05 MED ORDER — GUAIFENESIN-DM 100-10 MG/5ML PO SYRP: 5.0000 mL | ORAL_SOLUTION | ORAL | 0 refills | Status: DC | PRN

## 2016-05-05 MED ORDER — METOPROLOL TARTRATE 25 MG PO TABS: 25.0000 mg | ORAL_TABLET | Freq: Two times a day (BID) | ORAL | 1 refills | Status: DC

## 2016-05-05 NOTE — Discharge Instructions (Addendum)
You were admitted to the hospital for balance issues and diagnosed with a condition called post-viral cerebellar ataxia. This condition is caused by a respiratory virus or the "common cold." Your symptoms should continue to improve over the next few days as you clear the virus.  In addition to your balance problems, your blood potassium level was also low. This result can be a side effect of your blood pressure medication HCTZ (hydrochlorothiazide). Your blood pressure has been normal off blood pressure medications while in the hospital. Hold off on restarting Losartan and HCTZ until you see your doctor next week. You should continue to take the Metoprolol.  If your balance worsens or your symptoms return, call your doctor.  If you develop slurred speech or sudden weakness in your face, arms, or legs, call 911 as you may be having a stroke.

## 2016-05-05 NOTE — Progress Notes (Signed)
Physical Therapy Treatment Patient Details Name: [name] MRN: [id number] DOB: [birth year] Today's Date: 05/05/2016    History of Present Illness 65yo female with a past medical history significant for adenocarcinoma of the breast in 2009 s/p lumpectomy/chemo/RT, CAD, HTN, asthma, chronic back pain, lumbar spinal stenosis, depression and anxiety who presents from [facility] with 3 weeks of generalized weakness and 1 day of gait instability and light-headedness. MRI on 8/22 neg for acute intracranial abnormality.     PT Comments    Progressed well.  DGI 23/24 to reflect very low fall risk at this time with pt feeling okay.   Follow Up Recommendations  Home health PT     Equipment Recommendations  None recommended by PT    Recommendations for Other Services       Precautions / Restrictions Precautions Precautions: Fall    Mobility  Bed Mobility                  Transfers Overall transfer level: Needs assistance     Sit to Stand: Modified independent (Device/Increase time)         General transfer comment: generally safet in homelike environment  Ambulation/Gait Ambulation/Gait assistance: Modified independent (Device/Increase time) Ambulation Distance (Feet): 450 Feet Assistive device: None Gait Pattern/deviations: Step-through pattern Gait velocity: functional Gait velocity interpretation: >2.62 ft/sec, indicative of independent community ambulator General Gait Details: safe and steady, some mild degradation in fluidity of gait with fatigue   Stairs Stairs: Yes Stairs assistance: Independent Stair Management: No rails;Alternating pattern;Forwards Number of Stairs: 5 General stair comments: safe with/without rails  Wheelchair Mobility    Modified Rankin (Stroke Patients Only)       Balance     Sitting balance-Leahy Scale: Normal       Standing balance-Leahy Scale: Good                      Cognition Arousal/Alertness:  Awake/alert Behavior During Therapy: WFL for tasks assessed/performed Overall Cognitive Status: Within Functional Limits for tasks assessed                      Exercises      General Comments General comments (skin integrity, edema, etc.): With fatigue shows mild gait unsteadiness, but still safe in a homelike environment and in the community.      Pertinent Vitals/Pain Pain Assessment: Faces Faces Pain Scale: No hurt Pain Location: legs Pain Descriptors / Indicators: Discomfort Pain Intervention(s): Monitored during session    Home Living                      Prior Function            PT Goals (current goals can now be found in the care plan section) Acute Rehab PT Goals Patient Stated Goal: get back to being independent PT Goal Formulation: With patient Time For Goal Achievement: 05/11/16 Potential to Achieve Goals: Good Progress towards PT goals: Progressing toward goals    Frequency  Min 3X/week    PT Plan Current plan remains appropriate    Co-evaluation             End of Session   Activity Tolerance: Patient tolerated treatment well Patient left: Other (comment);with call bell/phone within reach (up in room)     Time: [id number] PT Time Calculation (min) (ACUTE ONLY): 15 min  Charges:  $Gait Training: 8-22 mins  G Codes:      Tia Gelb, Tessie Fass 05/05/2016, 12:00 PM 05/05/2016  Donnella Sham, PT [phone]  (pager)

## 2016-05-05 NOTE — Progress Notes (Signed)
Speech Language Pathology Treatment: Cognitive-Linquistic  Patient Details Name: [name] MRN: [id number] DOB: 07-06-1951 Today's Date: 05/05/2016 Time: [id number] SLP Time Calculation (min) (ACUTE ONLY): 19 min  Assessment / Plan / Recommendation Clinical Impression  Pt seen for f/u cognitive tx and reports improved mentation, but still with decreased recall of new information. She is able to independently recall key events from hospital stay as well as recommendations from other providers; however, she has difficulty recalling which medications RN had given her already this morning, and medications were administered upon SLP arrival. She was able to demonstrate adequate divided attention by carrying a phone conversation while she was dressing/packing her belongings for her impending d/c. Min cues provided for safety awareness as pt was up and moving about her room. Recommend 24 hour supervision upon d/c home as well as [facility] SLP services.   HPI HPI: 65 y.o.female with new onset diziness admitted for stroke work up. PMH significant for breast cancer, anxiety, asthma, CAD, back pain, depression, HTN      SLP Plan  Continue with current plan of care     Recommendations                Follow up Recommendations: 24 hour supervision/assistance;Home health SLP Plan: Continue with current plan of care     GO                Germain Osgood 05/05/2016, 11:54 AM  Germain Osgood, M.A. CCC-SLP [phone]

## 2016-05-05 NOTE — Progress Notes (Signed)
Subjective: [name] is not feeling "too well" this morning. She has some tightness in her chest that started after walking to the bathroom and continues as she lies in bed. She also has a cough that makes the chest tightness worse. She denies shortness of breath. On a positive note, she feels much more stable on her feet and feels like her strength is near-baseline.   Objective: Vital signs in last 24 hours: Vitals:   05/04/16 1848 05/04/16 2110 05/05/16 0127 05/05/16 0549  BP: 140/76 (!) 143/83 140/79 (!) 147/81  Pulse: 88 82 87 65  Resp: 18 20 20 20   Temp: 98 F (36.7 C) 99.3 F (37.4 C) 98.7 F (37.1 C) 97.7 F (36.5 C)  TempSrc: Oral Oral Oral Oral  SpO2: 100% 99% 100% 100%  Weight:      Height:       Weight change:   Intake/Output Summary (Last 24 hours) at 05/05/16 1006 Last data filed at 05/05/16 0908  Gross per 24 hour  Intake              120 ml  Output                0 ml  Net              120 ml   Physical Exam: General: alert, cooperative, NAD. Lungs: CTA bilaterally. No wheezes. Heart: RRR, no murmurs, gallops, or rubs Abdomen: NTND. BS+ Extremities: Warm and well-perfused. No edema.  Lab Results: Component     Latest Ref Rng & Units 05/05/2016  Sodium     135 - 145 mmol/L 139  Potassium     3.5 - 5.1 mmol/L 4.1  Chloride     101 - 111 mmol/L 109  CO2     22 - 32 mmol/L 27  Glucose     65 - 99 mg/dL 123 (H)  BUN     6 - 20 mg/dL 16  Creatinine     0.44 - 1.00 mg/dL 0.81  Calcium     8.9 - 10.3 mg/dL 9.0  EGFR (Non-African Amer.)     >60 mL/min >60  EGFR (African American)     >60 mL/min >60  Anion gap     5 - 15 3 (L)  Magnesium     1.7 - 2.4 mg/dL 2.1   Studies/Results: Echocardiogram (complete) 05/04/2016: Study Conclusions - Left ventricle: The cavity size was normal. There was moderate   concentric hypertrophy. Systolic function was normal. The   estimated ejection fraction was in the range of 60% to 65%. Wall   motion was  normal; there were no regional wall motion   abnormalities. Doppler parameters are consistent with abnormal   left ventricular relaxation (grade 1 diastolic dysfunction). - Aortic valve: Trileaflet; normal thickness leaflets. There was no   regurgitation. - Aortic root: The aortic root was normal in size. - Mitral valve: There was no regurgitation. - Left atrium: The atrium was normal in size. - Right ventricle: Systolic function was normal. - Right atrium: The atrium was normal in size. - Tricuspid valve: There was mild regurgitation. - Pulmonic valve: There was no regurgitation. - Pulmonary arteries: Systolic pressure was within the normal   range. - Inferior vena cava: The vessel was normal in size. - Pericardium, extracardiac: There was no pericardial effusion. Impressions: - No cardiac source of emboli was indentified.  Medications:  Scheduled Meds: . aspirin EC  81 mg Oral Daily  . cycloSPORINE  1 drop Both Eyes BID  . diclofenac sodium  4 g Topical QID  . enoxaparin (LOVENOX) injection  40 mg Subcutaneous Q24H  . loratadine  10 mg Oral Daily  . metoprolol tartrate  25 mg Oral BID  . pantoprazole  40 mg Oral Daily  . potassium chloride  40 mEq Oral BID  . pravastatin  40 mg Oral QPM   Continuous Infusions: . 0.9 % NaCl with KCl 40 mEq / L 75 mL/hr (05/04/16 1425)   PRN Meds:.acetaminophen, albuterol, ALPRAZolam, guaiFENesin-dextromethorphan, oxymetazoline   Assessment/Plan: [name] is a 65 yo female with a past medical history significant for adenocarcinoma of the right breast s/p lumpectomy/chemo/RT, CAD, HTN, lumbar spinal stenosis, asthma, anxiety, and depression who presents with new chest tightness, transient gait instability and improved cerebellar signs on exam.  Chest pain: Intermittent episodes of chest tightness during her admission worsened with ambulation and coughing. These episodes have typically resolved on their own or with tylenol. She is  experiencing no other symptoms that would be concerning for angina, though prior to admission, she did have one episode of substernal, non-radiating chest pain at rest that resolved after 20 minutes. While admitted, she has had 3 negative troponins, a normal echocardiogram, and EKG unchanged from previous. She does have a history of anterior MI in 2003 (no stents placed), but her current pain and pain prior to admission feel very different to her. Her current symptoms are more consistent with pleuritic chest pain or a costochondritis.  --start Robitussin DM 68m q4h prn to control cough  --continue tylenol prn  --continue meds for chronic sinusitis/allergic rhinitis and asthma (afrin, claritin, albuterol, restasis)  --discontinue cardiac monitoring  Gait instability, positive cerebellar findings: Cerebellar signs normalized since 8/23 and gait improved today. MR/MRA head show no acute abnormalities concerning for major stroke or metastases. Echo and carotid dopplers show no thrombus or significant stenosis, respectively. She could certainly have suffered a minor stroke without significant brain ischemia for which rehabilitation and risk reduction with medication is appropriate. She is already on a beta blocker, ARB, statin, and aspirin. With her recent URI symptoms and quick improvement in neuro findings, a post-viral cerebellar ataxia is also a possibility and would be consistent with her generalized weakness preceding the gait instability. Hypokalemic periodic paralysis was also considered but clinical picture is not consistent with this disorder. PT/OT saw patient and agree she is appropriate for home health PT.   --Will arrange for home health PT prior to discharge  --close PCP follow-up  Hypokalemia: Resolved with iv NS + KCl 40 mEq overnight, 160 mEq K-dur over 2 days                       --hold home HCTZ  Hypertension: Systolic 1389'H-734'K No hemorrhagic stroke on MR/MRA. Restarted metoprolol  8/23. Will restart losartan today. Holding HCTZ for hypokalemia.  Rash under left breast:Painful, non-pruritic, erythematous. Not in dermatomal pattern. Unclear etiology though it does not appear to be candidiasis. She saw a dermatologist who did not biopsy rash. Using clobetasol propionate cream without improvement.  --has appointment with dermatology 8/29  History Adenocarcinoma of right breast:diagnosed Dec 2008. Treated with neo-adjuvant chemotherapy from 1/6-6/22/2009 with FEC x4 followed by dose dense Taxotere w/ Xeloda 1000 mg bid for 8 wks for ER/PR negative HER-2 negative cancer. Underwent lumpectomy 05/06/08, determined to be T2, N0, M0 (stage IIA). She then underwent localized radiation therapy from 9/22-11/10/09.   History of hypercalcemia: Calcium 10.4 on  admission, now 9.5. It has been as high as 10.5 in past 7 months. Corrected for albumin, Ca is ~9.8. Work-up in clinic revealed slightly elevated (inappropriately normal) PTH, PTHrp <1.1. 24 hr urine calcium was 92, essentially ruling out [place]. Vit D slightly below normal (patient currently on combination calcium-vit D supplement). Corrected calcium is essentially normal, but patient might have mild primary hyperparathyroidism per lab findings.  Lower extremity pain:Continue voltaren gel, tylenol  CAD, HLD:Per records, patient suffered an anterior MI in 2003, no stenting. Repeat catheterization in 2005 showed stable non-obstructive CAD with 40% LAD lesion and 25% circumflex lesion.Lipid panel 6/17: total 181, LDL 101, HDL 49. --continue pravastatin --continue asa 81                       --restarted metoprolol 8/23            --restart Losartan 8/24  GERD: Continue Protonix  Anxiety:Xanax 0.5 prn (up to tid)  FEN/GI:Regular diet  DVT prophylaxis:Lovenox  Code Status: FULL, confirmed on admission  Disposition: Anticipate discharge to home today  with home health PT.  This is a Careers information officer Note.  The care of the patient was discussed with Dr. Posey Pronto and the assessment and plan formulated with their assistance.  Please see their attached note for official documentation of the daily encounter.   LOS: 2 days   Pershing Cox, Medical Student 05/05/2016, 10:06 AM

## 2016-05-05 NOTE — Discharge Summary (Signed)
Medicine attending discharge note: I personally examined this patient on the day of discharge and I attest to the accuracy of the evaluation and management plan as recorded in the daily progress note by resident physician Dr. Charlott Rakes and acting intern Ms. Pershing Cox.  Clinical summary: 65 year old woman with a history of stage IIa, triple negative, cancer of the right breast diagnosed in 2008 treated with neoadjuvant chemotherapy, surgery, and radiation. She has hypertension and coronary artery disease with a history of prior MI in 2003. She presented on the day of the current admission with a 3 week history of generalized weakness and then development of intermittent muscle cramps, headaches, blurred vision. In the 24 hours prior to admission she developed dizziness and unsteady gait and was falling to the right side. She had a 20 minute episode of nonradiating chest pressure with associated dyspnea. She was initially evaluated in the outpatient medicine clinic. Blood pressure was 169/87. Oxygen saturation 100% on room air. Pulse 81 and regular. She had an unsteady gait but no other focal neurologic abnormalities. MRI and MRA of the brain showed no acute intracranial abnormalities. There were some nonspecific foci of T2 FLAIR hyperintensity in the subcortical and periventricular white matter mildly progressed from 2010 and likely chronic. Major vessels all patent. Carotid Doppler studies with subcritical stenosis 1-39 percent bilaterally. Normal flow through the vertebral arteries. Cardiogram with normal sinus rhythm. No acute changes. Q-wave in lead 3. Troponin undetectable 3.  Hospital course: She was treated symptomatically. Initial bed rest. Cardiac monitor. Echocardiogram done to evaluate cardiac valves. Normal left ventricular systolic function ejection fraction 60-65 percent. Grade 1 diastolic dysfunction. No major valve abnormalities. Her neurologic symptoms subsided over the course of the  next 48 hours with minimal residual at discharge. Physical therapy evaluation at discharge felt that she had made steady progress and was at very low risk to fall at this time. Home physical therapy 3 times a week recommended.  She developed an interval nonproductive cough associated with non-ischemic quality chest discomfort. Given the negative brain scans, and a likely incipient viral infection, her symptoms may be all secondary to a viral related labyrinthitis. Small vessel vertebrobasilar insufficiency cannot be excluded. Potassium low normal on admission and fell to 2.9. Replaced by mouth and by vein and repeat 4.1 on August 24. Blood pressure improved at bedrest. Borderline elevation at discharge 147/81.  Disposition: Condition stable at time of discharge Follow-up in internal medicine clinic There were no complications

## 2016-05-05 NOTE — Telephone Encounter (Signed)
Needs TOC Discharge date 05/05/16 HFU 05/12/16

## 2016-05-05 NOTE — Progress Notes (Addendum)
   Subjective: This morning, she experienced some chest pain associated with coughing when she got up to go use the bathroom. She thinks this might be likely to a developing viral illness. She feels more steady in her feet and is agreeable to going home.   Objective:  Vital signs in last 24 hours: Vitals:   05/04/16 1848 05/04/16 2110 05/05/16 0127 05/05/16 0549  BP: 140/76 (!) 143/83 140/79 (!) 147/81  Pulse: 88 82 87 65  Resp: _0 Temp: 98 F (36.7 C) 99.3 F (37.4 C) 98.7 F (37.1 C) 97.7 F (36.5 C)  TempSrc: Oral Oral Oral Oral  SpO2: 100% 99% 100% 100%  Weight:      Height:       Physical Exam  Constitutional: She is oriented to person, place, and time. No distress.  HENT:  Head: Normocephalic and atraumatic.  Eyes: Conjunctivae are normal. No scleral icterus.  Cardiovascular: Normal rate and regular rhythm.   Pulmonary/Chest: Effort normal. No respiratory distress.  Neurological: She is alert and oriented to person, place, and time. No cranial nerve deficit. Coordination normal.  Skin: She is not diaphoretic.   Assessment/Plan:  Principal Problem:   Gait instability Active Problems:   Essential hypertension   Coronary atherosclerosis   Chest pain   Breast cancer of upper-inner quadrant of right female breast (HCC)   GERD (gastroesophageal reflux disease)   Allergic rhinitis   Lumbar arthropathy (HCC)   Rash and nonspecific skin eruption   Dizziness  [name] is a 65 year old female with right breast adenocarcinoma [ER/PR/HER-2 negative] status post lumpectomy/chemotherapy [5-FU/epirubicin/cyclophosphamide and docetaxel/capecitabine]/, CAD with history of MI, hypertension, lumbar spinal stenosis, depression/anxiety hospitalized for gait instability which has since improved.  Gait instability: Likely in the setting of acute viral illness. Potassium improved to 4.1, Mg 2.1. PT recommending Home Health PT. -Discontinue HCTZ and hold vasartan to reassess  blood pressure at follow-up, especially if she has reduced oral intake over the next several days -Consult Case Management for Home Health PT  CAD with history of MI: Echo reassuring with preserved ejection fraction 60-65% and grade 1 diastolic dysfunction.  -Continue aspirin 81 mg.   Viral illness: Continue albuterol nebulizer every 6 hours needed for wheezing and prescribe guaifenesin/dextromethrophan for cough as needed.  Skin lesions: Followed by outpatient dermatology.  Hyperlipidemia: Continue pravastatin 40 mg daily.  GERD: Continue pantoprazole 40 mg daily.  Allergic rhinitis: Continue loratadine 10 mg daily and fluticasone 1-2 sprays per nostril daily.  Depression/anxiety: Continue alprazolam 0.5 mg to be taken 3 times daily as needed for anxiety.  Dispo: Anticipated discharge today.   Riccardo Dubin, MD 05/05/2016, 10:47 AM Pager: _1 @

## 2016-05-05 NOTE — Progress Notes (Signed)
RN discharged to home with fiance. RN discussed discharge instructions with patient including prescriptions (stop valsartan, hctz, continue metoprolol, take cough syrup for cough as prescribed). Patient vocalized understanding of f/u appt as scheduled. Patient is waiting for wheelchair to be taken to car. MD paged for refill of metoprolol, per patient, she has no more at home.

## 2016-05-05 NOTE — Discharge Summary (Signed)
Name: [name] MRN: [id number] DOB: 18-Sep-1950 65 y.o. PCP: Linda Raring, MD  Date of Admission: 05/03/2016 12:09 PM Date of Discharge: 05/05/2016 Attending Physician: Linda Belt, MD  Discharge Diagnosis: Principal Problem:   Gait instability Active Problems:   Essential hypertension   Coronary atherosclerosis   Chest pain   Breast cancer of upper-inner quadrant of right female breast (HCC)   GERD (gastroesophageal reflux disease)   Allergic rhinitis   Lumbar arthropathy (HCC)   Rash and nonspecific skin eruption   Dizziness   Discharge Medications:   Medication List    STOP taking these medications   hydrochlorothiazide 12.5 MG tablet Commonly known as:  HYDRODIURIL   valsartan 320 MG tablet Commonly known as:  DIOVAN     TAKE these medications   acetaminophen 500 MG tablet Commonly known as:  TYLENOL Take 500 mg by mouth every 6 (six) hours as needed for moderate pain.   ALPRAZolam 0.5 MG tablet Commonly known as:  XANAX Take 1 tablet (0.5 mg total) by mouth 3 (three) times daily as needed for anxiety.   ascorbic acid 1000 MG tablet Commonly known as:  VITAMIN C Take 500-1,000 mg by mouth daily.   aspirin EC 81 MG tablet Take 81 mg by mouth daily.   BIOTIN PO Take 1 tablet by mouth every morning.   CALCIUM-CARB 600 + D 600-125 MG-UNIT Tabs Generic drug:  Calcium Carbonate-Vitamin D Take 1 tablet by mouth daily.   cetirizine 10 MG tablet Commonly known as:  ZYRTEC take 1 tablet by mouth once daily What changed:  See the new instructions.   clindamycin 1 % lotion Commonly known as:  CLEOCIN T As directed   clobetasol cream 0.05 % Commonly known as:  TEMOVATE As directed   cycloSPORINE 0.05 % ophthalmic emulsion Commonly known as:  RESTASIS Place 1 drop into both eyes 2 (two) times daily.   EPIPEN 2-PAK 0.3 mg/0.3 mL Soaj injection Generic drug:  EPINEPHrine Inject 0.3 mg into the muscle once. For anaphylaxis   fluticasone  50 MCG/ACT nasal spray Commonly known as:  FLONASE Place 1-2 sprays into both nostrils daily.   guaiFENesin-dextromethorphan 100-10 MG/5ML syrup Commonly known as:  ROBITUSSIN DM Take 5 mLs by mouth every 4 (four) hours as needed for cough.   metoprolol tartrate 25 MG tablet Commonly known as:  LOPRESSOR take 1 tablet by mouth twice a day What changed:  See the new instructions.   metoprolol tartrate 25 MG tablet Commonly known as:  LOPRESSOR Take 1 tablet (25 mg total) by mouth 2 (two) times daily. What changed:  You were already taking a medication with the same name, and this prescription was added. Make sure you understand how and when to take each.   nitroGLYCERIN 0.4 MG SL tablet Commonly known as:  NITROSTAT Place 0.4 mg under the tongue every 5 (five) minutes as needed for chest pain.   polyethylene glycol powder powder Commonly known as:  GLYCOLAX/MIRALAX Take 17gm mixed with water or juice daily   pravastatin 40 MG tablet Commonly known as:  PRAVACHOL take 1 tablet by mouth every evening What changed:  See the new instructions.   PROAIR HFA 108 (90 Base) MCG/ACT inhaler Generic drug:  albuterol INHALE 2 PUFFS BY MOUTH IN TO THE LUNGS EVERY 4 HOURS AS NEEDED FOR WHEEZING OR SHORTNESS OF BREATH   vitamin E 400 UNIT capsule Generic drug:  vitamin E Take 400 Units by mouth daily.   VOLTAREN 1 % Gel  Generic drug:  diclofenac sodium Apply topically. 1% GEL       Disposition and follow-up:   [name] was discharged from [facility] in Stable condition.  At the hospital follow up visit please address:    Follow-up Appointments: Follow-up Information    [place]. Go on 05/12/2016.   Why:  1015 AM Contact information: [address] [facility] Course by problem list: Principal Problem:   Gait instability Active Problems:   Essential  hypertension   Coronary atherosclerosis   Chest pain   Breast cancer of upper-inner quadrant of right female breast (HCC)   GERD (gastroesophageal reflux disease)   Allergic rhinitis   Lumbar arthropathy (HCC)   Rash and nonspecific skin eruption   Dizziness   [name] is a 65 yo female with a past medical history significant for adenocarcinoma of the right breast s/p lumpectomy/chemo/RT, CAD, HTN, lumbar spinal stenosis with chronic back pain, asthma, anxiety, and depression hospitalized with gait instability and positive cerebellar signs concerning for a posterior circulation CVA.   Gait instability: Likely in the setting of acute viral illness. MR/MRA of the head showed no occlusions or stenosis of the vessels and no acute changes in the brain tissue concerning for a CVA or metastases [see findings below]. Echocardiogram showed no thrombus or cardiac abnormalities and carotid dopplers demonstrated a small amount of plaque with no stenosis. Her cerebellar signs improved on day #1 of admission and gait instability improved by day #2. With quick improvement of her symptoms, negative imaging findings, and recent upper respiratory symptoms, her diagnosis was consistent with a post-viral cerebellar ataxia. Physical and occupational therapy evaluated the patient and recommended Home Health PT which was arranged at discharge. -At follow-up, please assess resolution of symptoms.   Pleuritic chest pain: [name] experienced intermittent episodes of chest tightness during her admission that worsened with ambulation and coughing. CXR reassuring for no pulmonary etiology [see findings below]. These episodes typically resolved on their own or with Tylenol. Cardiac etiologies were ruled out with stable EKG findings, troponins negative x 3, stable echo findings.  -At follow-up, please assess resolution of symptoms.   Hypokalemia: Likely from thiazide use. Improved with oral supplementation.  -Please  check BMET at follow-up to assess   Hypertension: Home antihypertensives were stopped to allow for permissive hypertension the setting of a possible CVA. In the absence of CVA per workup noted above, metoprolol was restarted though valsartan and HCTZ were held as she remained normotensive throughout her stay. -Reassess hypertension and follow-up and consider reinitiating valsartan but hold HCTZ for reasons noted above  Discharge Vitals:   BP (!) 147/81 (BP Location: Left Arm)   Pulse 65   Temp 97.7 F (36.5 C) (Oral)   Resp 20   Ht 5\' 3"  (1.6 m)   Wt 173 lb 8 oz (78.7 kg)   SpO2 100%   BMI 30.73 kg/m   Pertinent Labs, Studies, and Procedures:  Imaging: CHEST  2 VIEW 05/04/16  COMPARISON:  09/08/2015   FINDINGS: The cardiomediastinal contours are normal. Calcified granuloma in the right lower lobe, partially obscured. Pulmonary vasculature is normal. No consolidation, pleural effusion, or pneumothorax. No acute osseous abnormalities are seen.  IMPRESSION: No active cardiopulmonary disease.  MRI/MRA HEAD WITHOUT CONTRAST 05/03/16 COMPARISON:  CT of the head dated 08/06/2014. MRI of the brain dated 02/26/2009.   FINDINGS: MRI HEAD  FINDINGS   Brain: No diffusion restriction to suggest acute infarct. No abnormal susceptibility hypointensity to indicate intracranial hemorrhage. No focal mass effect. There are a few scattered foci of T2 FLAIR hyperintense signal abnormality in subcortical and periventricular white matter there are mildly progressed from 2010 and probably represent chronic microvascular ischemic changes.   Extra-axial space: No hydrocephalus. No midline shift. No effacement of basilar cisterns. No extra-axial collection is identified. Proximal intracranial flow voids are maintained. No abnormality of the cervical medullary junction.   Other: Mild ethmoid sinus mucosal thickening. No abnormal signal of the mastoid air cells. Orbits are unremarkable. Calvarium is  unremarkable.   MRA HEAD FINDINGS   Anterior circulation: Bilateral middle cerebral arteries and anterior cerebral arteries are patent. No occlusion, aneurysm, dissection, or significant stenosis is identified.   Posterior circulation: Bilateral vertebral arteries and the basilar artery are patent. No occlusion, aneurysm, dissection, or significant stenosis is identified.   Anatomic variant: Bilateral fetal posterior cerebral arteries. Hypoplastic P1 segments. Patent anterior communicating artery.   IMPRESSION: 1. No acute intracranial abnormality is identified. 2. Nonspecific foci of T2 FLAIR hyperintensity in subcortical and periventricular white matter are mildly progressed from 2010 and probably represent chronic microvascular ischemic changes. 3. No occlusion, aneurysm, dissection, or significant stenosis of the circle of Willis is identified. Bilateral fetal posterior cerebral arteries, normal variant.  Echocardiogram 05/04/16: Left ventricle: The cavity size was normal. There was moderate concentric hypertrophy. Systolic function was normal. The estimated ejection fraction was in the range of 60% to 65%. Wall motion was normal; there were no regional wall motion abnormalities. Doppler parameters are consistent with abnormal left ventricular relaxation (grade 1 diastolic dysfunction). - Aortic valve: Trileaflet; normal thickness leaflets. There was no regurgitation. - Aortic root: The aortic root was normal in size. - Mitral valve: There was no regurgitation. - Left atrium: The atrium was normal in size. - Right ventricle: Systolic function was normal. - Right atrium: The atrium was normal in size. - Tricuspid valve: There was mild regurgitation. - Pulmonic valve: There was no regurgitation. - Pulmonary arteries: Systolic pressure was within the normal range. - Inferior vena cava: The vessel was normal in size. - Pericardium, extracardiac: There was no pericardial effusion.    Impressions:  - No cardiac source of emboli was identified.  Carotid Dopplers 05/03/16: Findings are consistent with a 1-39 percent stenosis involving the right internal carotid artery and the left internal carotid artery. The vertebral arteries demonstrate antegrade flow.  Discharge Instructions: Discharge Instructions    Call MD for:  difficulty breathing, headache or visual disturbances    Complete by:  As directed   Call MD for:  extreme fatigue    Complete by:  As directed   Call MD for:  persistant dizziness or light-headedness    Complete by:  As directed   Increase activity slowly    Complete by:  As directed      Signed: Riccardo Dubin, MD 05/05/2016, 12:28 PM   Pager: @MYPAGER @

## 2016-05-05 NOTE — Progress Notes (Signed)
Patient complaining of chest pain on her right side. Vitals stable. No shortness of breath. MD paged. Monitoring closely. Demyan Fugate, Rande Brunt, RN

## 2016-05-05 NOTE — Progress Notes (Signed)
MD sent rx for metoprolol to pharmacy

## 2016-05-05 NOTE — Care Management Note (Signed)
Case Management Note  Patient Details  Name: [name] MRN: [id number] Date of Birth: 12-02-1950  Subjective/Objective:                    Action/Plan: Pt discharging home with orders for [facility] services. CM met with the patient and provide her a list of [place] agencies in the [place] area. She selected [facility]. Karolee Stamps with [facility] notified and accepted the referral. Pt also requesting only to have female therapists and [place] informed. OT recommended 3 in 1 but patient states she does not need this DME. Will update the bedside RN.   Expected Discharge Date:                  Expected Discharge Plan:  [place]  In-House Referral:     Discharge planning Services  CM Consult  Post Acute Care Choice:  Home Health Choice offered to:  Patient  DME Arranged:    DME Agency:     HH Arranged:  PT, OT HH Agency:  [facility]  Status of Service:  Completed, signed off  If discussed at [place] of Stay Meetings, dates discussed:    Additional Comments:  Pollie Friar, RN 05/05/2016, 11:58 AM

## 2016-05-06 ENCOUNTER — Telehealth: Payer: Self-pay

## 2016-05-06 NOTE — Telephone Encounter (Signed)
[name] from [place] home health requesting to speak with a nurse regarding outpatient referral.

## 2016-05-09 ENCOUNTER — Other Ambulatory Visit: Payer: Self-pay | Admitting: Internal Medicine

## 2016-05-09 NOTE — Telephone Encounter (Signed)
Transition Care Management Follow-up Telephone Call   Date discharged? 05/05/16   How have you been since you were released from the hospital? Vision disturbance but better now. Feeling weak today. Talked about low K+.   Do you understand why you were in the hospital? Yes.   Do you understand the discharge instructions? Yes   Where were you discharged to? Home  Items Reviewed:  Medications reviewed: Yes  Allergies reviewed: Yes  Dietary changes reviewed:  No  Referrals reviewed: Derm - appt tomorrow.   Functional Questionnaire:   Activities of Daily Living (ADLs):   She states they are independent in the following: N/A States they require assistance with the following: N/A   Any transportation issues/concerns?: No   Any patient concerns? K+ level.   Confirmed importance and date/time of follow-up visits scheduled 05/12/16  Provider Appointment booked with [facility]  Confirmed with patient if condition begins to worsen call PCP or go to the ER.  Patient was given the office number and encouraged to call back with question or concerns.  : Yes

## 2016-05-09 NOTE — Telephone Encounter (Signed)
[name] , physical therapist - states home PT was ordered for pt upon discharged from the hospital but since pt since is not home bound/ she drives, this will not be covered and will need order for ambulatory PT. She has an appt in [facility] Thursday. Thanks

## 2016-05-09 NOTE — Telephone Encounter (Signed)
PT was initially ordered to evaluate for continued oxygen requirement. If she is not having problems at home, not PT is needed. She can be evaluated in [facility] at her hospital f/u and referral can be made at that time if needed.

## 2016-05-09 NOTE — Telephone Encounter (Signed)
Thank you for the heads up. I agree with Dr. Gay Filler.

## 2016-05-10 DIAGNOSIS — B0089 Other herpesviral infection: Secondary | ICD-10-CM | POA: Diagnosis not present

## 2016-05-12 ENCOUNTER — Ambulatory Visit (INDEPENDENT_AMBULATORY_CARE_PROVIDER_SITE_OTHER): Payer: Medicare Other | Admitting: Internal Medicine

## 2016-05-12 ENCOUNTER — Encounter: Payer: Self-pay | Admitting: Internal Medicine

## 2016-05-12 VITALS — BP 144/76 | HR 86 | Temp 98.2°F | Ht 63.0 in | Wt 176.1 lb

## 2016-05-12 DIAGNOSIS — Z87891 Personal history of nicotine dependence: Secondary | ICD-10-CM

## 2016-05-12 DIAGNOSIS — R42 Dizziness and giddiness: Secondary | ICD-10-CM | POA: Diagnosis not present

## 2016-05-12 DIAGNOSIS — I10 Essential (primary) hypertension: Secondary | ICD-10-CM

## 2016-05-12 DIAGNOSIS — R2681 Unsteadiness on feet: Secondary | ICD-10-CM

## 2016-05-12 MED ORDER — VALSARTAN 320 MG PO TABS: 320.0000 mg | ORAL_TABLET | Freq: Every day | ORAL | 1 refills | Status: DC

## 2016-05-12 NOTE — Assessment & Plan Note (Signed)
BP Readings from Last 3 Encounters:  05/12/16 (!) 144/76  05/05/16 (!) 147/81  05/03/16 (!) 169/87   Blood pressure slightly elevated, but 2 of her meds were held on discharge. She was also slightly hypokalemic attributed to her hctz.  Plan -continue metoprolol 25 mg BID -restart DIovan -continue to hold the HCTZ -BMET today

## 2016-05-12 NOTE — Progress Notes (Signed)
   CC:  Hospital follow up for viral labyrinthitis, and hypokalemia HPI: [name] is a 65 y.o. woman with PMH noted below here for Hospital follow up for viral labyrinthitis, and hypokalemia  Please see Problem List/A&P for the status of the patient's chronic medical problems   Past Medical History:  Diagnosis Date  . Adenocarcinoma of breast ([place]) 2009   right, s/p chemo/ xrt  . Anal fissure   . Anxiety   . Asthma   . CAD (coronary artery disease)    Nonobstructive on cath 2003 and 2005  . Chronic back pain   . Depression   . Diverticulosis of colon (without mention of hemorrhage)   . Dog bite(E906.0)   . GERD (gastroesophageal reflux disease)   . Headache   . Hiatal hernia   . Hypertension   . Irritable bowel syndrome   . Jaundice    Hx of Jaundice at age 20 from "dirty restuarant". Unsure of Hepatitis type  . Lumbar radiculopathy    bilat LE's  . Neuropathy ([place])    bilat LE's  . Pain management   . Panic attacks   . Spinal stenosis, lumbar region, without neurogenic claudication     Review of Systems: Denies fevers, chills, fatigue Denies dizziness except one 2-minute episode Tuesday morning,  Denies n/v/abd pain or muscle cramps  Physical Exam: Vitals:   05/12/16 1022  BP: (!) 144/76  Pulse: 86  Temp: 98.2 F (36.8 C)  TempSrc: Oral  SpO2: 99%  Weight: 176 lb 1.6 oz (79.9 kg)  Height: 5\' 3"  (1.6 m)    General: A&O, in NAD CV: RRR, normal s1, s2, no m/r/g, no carotid bruits appreciated Resp: equal and symmetric breath sounds, no wheezing or crackles  Abdomen: soft, nontender, nondistended, +BS Neurological exam: 5/5 strength in upper and lower extremities. PERRLA, EOMI   Assessment & Plan:   See encounters tab for problem based medical decision making. Patient discussed with Dr. Eppie Gibson

## 2016-05-12 NOTE — Patient Instructions (Addendum)
Thank you for your visit today  Please restart the valsartan, and continue the physical therapy
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
## 2016-05-13 LAB — BMP8+ANION GAP
ANION GAP: 17 mmol/L (ref 10.0–18.0)
BUN/Creatinine Ratio: 26 (ref 12–28)
BUN: 20 mg/dL (ref 8–27)
CALCIUM: 9.7 mg/dL (ref 8.7–10.3)
CO2: 24 mmol/L (ref 18–29)
CREATININE: 0.78 mg/dL (ref 0.57–1.00)
Chloride: 102 mmol/L (ref 96–106)
GFR, EST AFRICAN AMERICAN: 94 mL/min/{1.73_m2} (ref >59)
GFR, EST NON AFRICAN AMERICAN: 81 mL/min/{1.73_m2} (ref >59)
Glucose: 99 mg/dL (ref 65–99)
Potassium: 4.1 mmol/L (ref 3.5–5.2)
Sodium: 143 mmol/L (ref 134–144)

## 2016-05-13 NOTE — Progress Notes (Signed)
Case discussed with Dr. Saraiya at the time of the visit.  We reviewed the resident's history and exam and pertinent patient test results.  I agree with the assessment, diagnosis and plan of care documented in the resident's note. 

## 2016-05-17 ENCOUNTER — Telehealth: Payer: Self-pay | Admitting: *Deleted

## 2016-05-17 NOTE — Telephone Encounter (Signed)
She would need to be seen before I can increase her Xanax as I have not had the chance to meet her yet. Please recommend that she see her therapist on 9/7 and if she still feels that she needs further adjustments to her anxiety medications, she can be scheduled in [facility] at first available.
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
## 2016-05-19 ENCOUNTER — Ambulatory Visit (INDEPENDENT_AMBULATORY_CARE_PROVIDER_SITE_OTHER): Payer: Medicare Other | Admitting: Psychiatry

## 2016-05-19 DIAGNOSIS — F4323 Adjustment disorder with mixed anxiety and depressed mood: Secondary | ICD-10-CM | POA: Diagnosis not present

## 2016-05-20 ENCOUNTER — Ambulatory Visit (HOSPITAL_COMMUNITY)
Admission: RE | Admit: 2016-05-20 | Discharge: 2016-05-20 | Disposition: A | Discharge status: Home / Self Care | Payer: Medicare Other | Source: Ambulatory Visit | Attending: Internal Medicine | Admitting: Internal Medicine

## 2016-05-20 ENCOUNTER — Ambulatory Visit (INDEPENDENT_AMBULATORY_CARE_PROVIDER_SITE_OTHER): Payer: Medicare Other | Admitting: Internal Medicine

## 2016-05-20 DIAGNOSIS — S60222A Contusion of left hand, initial encounter: Secondary | ICD-10-CM | POA: Diagnosis not present

## 2016-05-20 DIAGNOSIS — Z87891 Personal history of nicotine dependence: Secondary | ICD-10-CM

## 2016-05-20 DIAGNOSIS — M79642 Pain in left hand: Secondary | ICD-10-CM | POA: Insufficient documentation

## 2016-05-20 DIAGNOSIS — F4321 Adjustment disorder with depressed mood: Secondary | ICD-10-CM | POA: Diagnosis not present

## 2016-05-20 DIAGNOSIS — M7989 Other specified soft tissue disorders: Secondary | ICD-10-CM | POA: Diagnosis not present

## 2016-05-20 MED ORDER — ALPRAZOLAM 0.5 MG PO TABS: 0.5000 mg | ORAL_TABLET | Freq: Three times a day (TID) | ORAL | 0 refills | Status: DC | PRN

## 2016-05-20 NOTE — Assessment & Plan Note (Signed)
Patient reports accidentally closing heavy safe door onto her left hand. 3 x 3 cm area of bruising present over dorsum of her left hand, centered on MCP of her third digit. Minimal bruising or erythema at this time, skin intact. Finger movement limited due to pain. Second and third digits tender to palpation down their entire length. Likely severe bruise vs fracture.  Plan: - Will obtain left hand film to look for potential fracture - Recommended rest, ice, compression, tylenol

## 2016-05-20 NOTE — Progress Notes (Signed)
   CC: Anxiety / grief  HPI:  [id number] E Mancel Bale is a 65 y.o. female with PMHx detailed below presenting with acute grief secondary to the recent unexpected loss of her fiance of 10 years and stress due to intra-family disputes with his estranged children.  See problem based assessment and plan below for additional details.  Past Medical History:  Diagnosis Date  . 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 Anal fissure   . Anxiety   . Asthma   . CAD (coronary artery disease)    Nonobstructive on cath 2003 and 2005  . Chronic back pain   . Depression   . Diverticulosis of colon (without mention of hemorrhage)   . Dog bite(E906.0)   . GERD (gastroesophageal reflux disease)   . Headache   . Hiatal hernia   . Hypertension   . Irritable bowel syndrome   . Jaundice    Hx of Jaundice at age 73 from "dirty restuarant". Unsure of Hepatitis type  . Lumbar radiculopathy    bilat LE's  . Neuropathy ([place])    bilat LE's  . Pain management   . Panic attacks   . Spinal stenosis, lumbar region, without neurogenic claudication     Review of Systems: Review of Systems  Constitutional: Positive for malaise/fatigue. Negative for chills and fever.  Respiratory: Negative for shortness of breath.   Cardiovascular: Negative for chest pain.  Neurological: Negative for dizziness.  Psychiatric/Behavioral: Positive for depression. Negative for substance abuse and suicidal ideas. The patient is nervous/anxious and has insomnia.      Physical Exam: Vitals:   05/20/16 1536  BP: (!) 171/91  Pulse: 80  Temp: 97.8 F (36.6 C)  TempSrc: Oral  SpO2: 100%  Weight: 174 lb 4.8 oz (79.1 kg)  Height: 5\' 3"  (1.6 m)   GENERAL- Woman sitting comfortably in exam room chair, hand wrapped in ice pack, conversational HEENT- Atraumatic, PERRL, EOMI, moist mucous membranes CARDIAC- Regular rate and rhythm, no murmurs, rubs or gallops. RESP- Clear to ascultation bilaterally, no wheezing  or crackles, normal work of breathing NEURO- Alert and oriented, cranial nerves grossly intact, paraphasic errors occasionally present during speech SKIN- Warm, dry, intact, without visible rash PSYCH- Tearful affect, depressed mood, denies suicidal ideation, thoughts linear and goal-directed   Assessment & Plan:   See encounters tab for problem based medical decision making.  Patient seen with Dr. Daryll Drown

## 2016-05-20 NOTE — Assessment & Plan Note (Signed)
She reports being significantly depressed and anxious following the recent unexpected loss of her common law spouse (fiance of 10 years). He died following sudden cardiac arrest at home, she performed CPR on him until EMS arrived. He was admitted at [facility] and persisted in the ICU until it became apparent that neurologic recovery was not possible. Patient was listed as healthcare power of attorney in the medical record. Family and children living all over the country arrived and collective decision was made for impression and withdrawal of life support. However since the events at the hospital she has been harassed by a daughter of her late spouse, who has accused her of stealing, taken over all decisions regarding the wake and funeral process, and has threatened legal action and attempting to remove the patient from the will. Heated argument between family members have ensued and this is causing a great deal of stress for the patient who already had significant grief to deal with in the first place. Previously he had been taking only one to two 0.5 mg tablets of Xanax daily for anxiety, but has recently required 4-5 daily to cope with the stress and fall asleep at night. Requesting refill and modest trial and short-term increased dose. On exam, patient is tearful but appropriate, rational, in agreement with temporary nature of this prescription change. She has support from friends, family, church, and psychologist Kyra Leyland). Denies any SI or alcohol use. Patient has multiple anaphylactic allergies to SSRIs.  Plan: - Provided Xanax 0.5-1 mg TID prn, 90 tablet supply - Will follow up in 2 weeks

## 2016-05-20 NOTE — Patient Instructions (Signed)
Sorry for all the stress and grief you are going through right now. Remember that your friends, family, and church are here for you. Please continue to take your medications as prescribed. We have prescribed Xanax 1-2 tablets up to three times a day for anxiety.    If you have any questions or concerns, please call the clinic at [phone] or if it is the weekend or after hours, you may call [phone] and ask for the internal medicine resident on call.

## 2016-05-23 NOTE — Progress Notes (Signed)
Internal Medicine Clinic Attending  I saw and evaluated the patient.  I personally confirmed the key portions of the history and exam documented by Dr. Johnson and I reviewed pertinent patient test results.  The assessment, diagnosis, and plan were formulated together and I agree with the documentation in the resident's note.  

## 2016-05-30 ENCOUNTER — Ambulatory Visit
Admission: RE | Admit: 2016-05-30 | Discharge: 2016-05-30 | Disposition: A | Discharge status: Home / Self Care | Payer: Medicare Other | Source: Ambulatory Visit

## 2016-05-30 DIAGNOSIS — Z1231 Encounter for screening mammogram for malignant neoplasm of breast: Secondary | ICD-10-CM

## 2016-06-02 ENCOUNTER — Ambulatory Visit (INDEPENDENT_AMBULATORY_CARE_PROVIDER_SITE_OTHER): Payer: Medicare Other | Admitting: Psychiatry

## 2016-06-02 ENCOUNTER — Telehealth: Payer: Self-pay | Admitting: Internal Medicine

## 2016-06-02 DIAGNOSIS — F4323 Adjustment disorder with mixed anxiety and depressed mood: Secondary | ICD-10-CM | POA: Diagnosis not present

## 2016-06-02 NOTE — Telephone Encounter (Signed)
PT. REMINDER CALL, LMTCB °

## 2016-06-03 ENCOUNTER — Ambulatory Visit (INDEPENDENT_AMBULATORY_CARE_PROVIDER_SITE_OTHER): Payer: Medicare Other | Admitting: Internal Medicine

## 2016-06-03 ENCOUNTER — Encounter: Payer: Self-pay | Admitting: Internal Medicine

## 2016-06-03 DIAGNOSIS — Z87891 Personal history of nicotine dependence: Secondary | ICD-10-CM

## 2016-06-03 DIAGNOSIS — Z7982 Long term (current) use of aspirin: Secondary | ICD-10-CM

## 2016-06-03 DIAGNOSIS — F4321 Adjustment disorder with depressed mood: Secondary | ICD-10-CM

## 2016-06-03 NOTE — Assessment & Plan Note (Signed)
A:  Patient here to follow up for her acute grief reaction, anxiety, and depression surrounding the sudden loss of her common law spouse (her fiance with whom she lived for 10+ years).  Since being seen she has been adherent to her Xanax dosing as detailed in Dr. Durenda Age previous assessment and plan.  She still reports feeling harassed by her late partner's daughter who lives in [place].  Patient reports the daughter has stated there is proof the patient used her late partner's debit card at a [employer].  Patient reports the daughter told her if she did not pay her $1000 that she would have her arrested and was told to call the patient by her lawyer as a Manufacturing engineer.  This has all left the patient feeling scared, unsafe by herself, anxious, and having terrible difficulty trying to sleep on top of the grief she has already experienced.  She reports relying on her children and church for support.  She reports that her late spouse lived together for 10+ years and had plans, prior to him getting sick and passing away, of traveling to [facility] to get married.  She denies SI or alcohol use.  She is otherwise rational and appropriate on exam while being intermittently tearful.  P: - continue current management - follow up with PCP for further follow up unless needed sooner - if grief and symptoms continue to persist would recommend counseling

## 2016-06-03 NOTE — Progress Notes (Signed)
CC: here for follow up of her grief  HPI:  [name] is a 65 y.o. woman with a past medical history listed below here today for follow up of her acute grief secondary to the recent unexpected loss of her fiance of 10 years and stress due to intra-family disputes with his estranged daughter.  See problem based assessment and plan below for additional details  Past Medical History:  Diagnosis Date  . 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 Anal fissure   . Anxiety   . Asthma   . CAD (coronary artery disease)    Nonobstructive on cath 2003 and 2005  . Chronic back pain   . Depression   . Diverticulosis of colon (without mention of hemorrhage)   . Dog bite(E906.0)   . GERD (gastroesophageal reflux disease)   . Headache   . Hiatal hernia   . Hypertension   . Irritable bowel syndrome   . Jaundice    Hx of Jaundice at age 40 from "dirty restuarant". Unsure of Hepatitis type  . Lumbar radiculopathy    bilat LE's  . Neuropathy ([place])    bilat LE's  . Pain management   . Panic attacks   . Spinal stenosis, lumbar region, without neurogenic claudication     Review of Systems:   Please see pertinent ROS reviewed in HPI and problem based charting.   Physical Exam:  Vitals:   06/03/16 1437  BP: (!) 163/88  Pulse: 82  Temp: 98 F (36.7 C)  TempSrc: Oral  SpO2: 100%  Weight: 174 lb 14.4 oz (79.3 kg)  Height: 5\' 3"  (1.6 m)   Physical Exam  Constitutional: She is oriented to person, place, and time and well-developed, well-nourished, and in no distress.  HENT:  Head: Normocephalic and atraumatic.  Neurological: She is alert and oriented to person, place, and time. Gait normal.  Psychiatric:  Occasionally tearful, appropriately conversant, mildly depressed mood.     Assessment & Plan:   See Encounters Tab for problem based charting.  Patient discussed with Dr. Angelia Mould.  Grief reaction A:  Patient here to follow up for her acute  grief reaction, anxiety, and depression surrounding the sudden loss of her common law spouse (her fiance with whom she lived for 10+ years).  Since being seen she has been adherent to her Xanax dosing as detailed in Dr. Durenda Age previous assessment and plan.  She still reports feeling harassed by her late partner's daughter who lives in [place].  Patient reports the daughter has stated there is proof the patient used her late partner's debit card at a [employer].  Patient reports the daughter told her if she did not pay her $1000 that she would have her arrested and was told to call the patient by her lawyer as a Manufacturing engineer.  This has all left the patient feeling scared, unsafe by herself, anxious, and having terrible difficulty trying to sleep on top of the grief she has already experienced.  She reports relying on her children and church for support.  She reports that her late spouse lived together for 10+ years and had plans, prior to him getting sick and passing away, of traveling to [facility] to get married.  She denies SI or alcohol use.  She is otherwise rational and appropriate on exam while being intermittently tearful.  P: - continue current management - follow up with PCP for further follow up unless needed sooner -  if grief and symptoms continue to persist would recommend counseling

## 2016-06-03 NOTE — Patient Instructions (Signed)
Sorry for all the stress and grief you are going through right now. Remember that your friends, family, and church are here for you. Please continue to take your medications as prescribed.   If you have any questions or concerns, please call the clinic at [phone] or if it is the weekend or after hours, you may call [phone] and ask for the internal medicine resident on call.

## 2016-06-06 NOTE — Progress Notes (Signed)
Internal Medicine Clinic Attending  Case discussed with Dr. Wallace at the time of the visit.  We reviewed the resident's history and exam and pertinent patient test results.  I agree with the assessment, diagnosis, and plan of care documented in the resident's note.  

## 2016-06-09 ENCOUNTER — Ambulatory Visit: Payer: Medicare Other | Admitting: Psychiatry

## 2016-06-10 ENCOUNTER — Encounter: Payer: Self-pay | Admitting: Adult Health

## 2016-06-10 ENCOUNTER — Other Ambulatory Visit: Payer: Self-pay | Admitting: Adult Health

## 2016-06-10 ENCOUNTER — Ambulatory Visit (HOSPITAL_BASED_OUTPATIENT_CLINIC_OR_DEPARTMENT_OTHER): Payer: Medicare Other | Admitting: Adult Health

## 2016-06-10 ENCOUNTER — Other Ambulatory Visit: Payer: Self-pay | Admitting: *Deleted

## 2016-06-10 VITALS — BP 158/90 | HR 94 | Temp 98.0°F | Resp 19 | Wt 175.1 lb

## 2016-06-10 DIAGNOSIS — I10 Essential (primary) hypertension: Secondary | ICD-10-CM

## 2016-06-10 DIAGNOSIS — Z1231 Encounter for screening mammogram for malignant neoplasm of breast: Secondary | ICD-10-CM

## 2016-06-10 DIAGNOSIS — R609 Edema, unspecified: Secondary | ICD-10-CM | POA: Diagnosis not present

## 2016-06-10 DIAGNOSIS — Z23 Encounter for immunization: Secondary | ICD-10-CM

## 2016-06-10 DIAGNOSIS — F4321 Adjustment disorder with depressed mood: Secondary | ICD-10-CM | POA: Diagnosis not present

## 2016-06-10 DIAGNOSIS — C50211 Malignant neoplasm of upper-inner quadrant of right female breast: Secondary | ICD-10-CM | POA: Diagnosis not present

## 2016-06-10 MED ORDER — INFLUENZA VAC SPLIT QUAD 0.5 ML IM SUSY
0.5000 mL | PREFILLED_SYRINGE | Freq: Once | INTRAMUSCULAR | Status: AC
  Administered 2016-06-10: 0.5 mL via INTRAMUSCULAR
  Filled 2016-06-10: qty 0.5

## 2016-06-10 NOTE — Progress Notes (Signed)
CLINIC:  Survivorship   REASON FOR VISIT:  Routine follow-up for history of breast cancer.   BRIEF ONCOLOGIC HISTORY:    Breast cancer of upper-inner quadrant of right female breast ([place])   08/29/2007 Mammogram    Right breast mass macrolobulated 1.5 x 1.2 x 1.8 cm and 1.2 x 1.0 x 1.7 cm biopsy of lateral mass IDC ER 1% PR 0% HER-2 negative Ki-67 38%, MRI bilobed mass 4.9 x 2.6 x 4.8 cm      09/18/2007 - 03/03/2008 Neo-Adjuvant Chemotherapy    FEC x4 followed by dose dense Taxotere with Xeloda 1000 mg by mouth twice a day for 8 weeks (could not tolerate increased dose of Xeloda to 1500 mg), MRI showed decreased mass 3.4 x 2.6 x 3 cm      05/06/2008 Surgery    Right breast lumpectomy 3.8 cm tumor SLN negative T2, N0, M0 stage II A. pathologic staging      06/03/2008 - 07/22/2008 Radiation Therapy    Radiation therapy to lumpectomy site       INTERVAL HISTORY:  [name] presents to the [facility] today for routine follow-up for her history of breast cancer.    Since her last visit to the cancer center, her fiance Rush Landmark) of 11 years has died suddenly about 3 weeks ago.  They were planning to get married sometime this month at [facility], [place].  This has been a very difficult for her due to her grief, as well as the stress with her fiance's children not being supportive of her.  She tells me that they have called her "the [place] names you can think of" and their lack of support has been troubling for her.  Bill worked as a Engineer, maintenance (IT) and reportedly had legal documentation naming the patient the sole beneficiary to his savings/estate, as well as his health care power of attorney.  The time Bill spent in the ICU was very stressful for the patient because of his children's treatment of her.    It has been a very difficult time for her, but she tells me she is coping well.  Her faith is of great comfort for her, as well as her children.  She is currently living with her son and  daughter-in-law in [place]. She will be moving into a renovated mobile home on their property soon, which the patient is looking forward to.  She has been seen at [facility] recently for continued support; she also has a therapist named Kinnie Scales, who is also aware of her current situation.    Physically, she feels okay. She has low back pain/arthritis, which is chronic for her. She was recently hospitalized from 05/03/16-05/05/16 for chest pain, dizziness, and gait instability.  These issues have largely resolved.  She does report having some increased lower extremity/ankle edema within the past 3 weeks.  She tells me that her diet has changed pretty significantly since living with her son for these past few weeks; she endorses eating more salt than normal.  Her blood pressure is also elevated today.    REVIEW OF SYSTEMS:  Review of Systems  Cardiovascular: Positive for leg swelling.       Leg/feet swelling   Gastrointestinal: Negative.   Genitourinary: Negative.   Musculoskeletal: Positive for back pain and joint pain.  Neurological:       She reports having shingles to her chest (intramammary fold area) about 3 months ago; resolved; no residual pain  Endo/Heme/Allergies: Negative.  Psychiatric/Behavioral: Negative for suicidal ideas. The patient is nervous/anxious.   GU: Denies vaginal bleeding, discharge, or dryness.  Breast: Denies any new nodularity, masses, tenderness, nipple changes, or nipple discharge.    A 14-point review of systems was completed and was negative, except as noted above.    PAST MEDICAL/SURGICAL HISTORY:  Past Medical History:  Diagnosis Date  . Adenocarcinoma of breast ([place]) 2009   right, s/p chemo/ xrt  . Anal fissure   . Anxiety   . Asthma   . CAD (coronary artery disease)    Nonobstructive on cath 2003 and 2005  . Chronic back pain   . Depression   . Diverticulosis of colon (without mention of hemorrhage)   . Dog  bite(E906.0)   . GERD (gastroesophageal reflux disease)   . Headache   . Hiatal hernia   . Hypertension   . Irritable bowel syndrome   . Jaundice    Hx of Jaundice at age 9 from "dirty restuarant". Unsure of Hepatitis type  . Lumbar radiculopathy    bilat LE's  . Neuropathy ([place])    bilat LE's  . Pain management   . Panic attacks   . Spinal stenosis, lumbar region, without neurogenic claudication    Past Surgical History:  Procedure Laterality Date  . BLADDER REPAIR     tact  . BREAST LUMPECTOMY Right   . BREAST RECONSTRUCTION Right   . BREAST REDUCTION SURGERY Left   . CARDIAC CATHETERIZATION  2003, 2005  . CATARACT EXTRACTION    . CHOLECYSTECTOMY    . COLONOSCOPY  2013   Diverticulosis  . ESOPHAGEAL MANOMETRY  10/08/2012   Procedure: ESOPHAGEAL MANOMETRY (EM);  Surgeon: Sable Feil, MD;  Location: [facility];  Service: Endoscopy;  Laterality: N/A;  . ESOPHAGOGASTRODUODENOSCOPY  2014   Normal   . PARTIAL HYSTERECTOMY  1987     ALLERGIES:  Allergies  Allergen Reactions  . Amoxicillin Anaphylaxis    Throat Swells  . Azithromycin Anaphylaxis    Throat Swelling  . Bromfed Anaphylaxis    Throat Swelling  . Cephalexin Anaphylaxis    Throat Swelling  . Chlordiazepoxide-Clidinium Anaphylaxis    Throat Swelling  . Clotrimazole Swelling, Other (See Comments) and Hypertension    Patient told me that she couldn't swallow due to the medication  . Gabapentin Other (See Comments)    Nausea, weakness, lost movement and feeling in both legs (HAD TO CALL EMS)  . Gatifloxacin Shortness Of Breath and Other (See Comments)    Caused bad chest congestion and caused a severe asthma attack  . Ibuprofen Anaphylaxis  . Iohexol Anaphylaxis, Shortness Of Breath, Swelling and Other (See Comments)     Code: HIVES, Desc: throat swelling no hives 20 yrs ago;needs pre-medication  09/19/07 sg, Onset Date: [id number]   . Lidocaine Hives and Hypertension    REQUIRED A TRIP TO [place]   . Paroxetine Anaphylaxis    Throat Swelling  . Prednisone Anaphylaxis and Swelling    Throat swelling Throat swelling  . Pregabalin Anxiety and Anaphylaxis    nervousness  . Propoxyphene N-Acetaminophen Anaphylaxis    REACTION: swelling in the throat  . Sertraline Hcl Anaphylaxis    Throat Swelling  . Sulfa Antibiotics Anaphylaxis  . Sulfadiazine Anaphylaxis    Throat Swelling  . Verapamil Anaphylaxis    Throat Swelling  . Adhesive [Tape] Other (See Comments)    blisters  . Effexor [Venlafaxine] Hives and Itching  . Ibuprofen Nausea And Vomiting  . Latex Swelling  Blisters on Skin  . Lisinopril Other (See Comments)    ANGIOEDEMA  . Tussionex Pennkinetic Er [Hydrocod Polst-Cpm Polst Er] Itching and Photosensitivity    "Sunburn"  . Codeine Rash  . Dicyclomine Hcl Hives  . Hydralazine Hcl Itching and Rash  . Penicillins Hives    Has patient had a PCN reaction causing immediate rash, facial/tongue/throat swelling, SOB or lightheadedness with hypotension: Yes Has patient had a PCN reaction causing severe rash involving mucus membranes or skin necrosis: No Has patient had a PCN reaction that required hospitalization No Has patient had a PCN reaction occurring within the last 10 years: No If all of the above answers are "NO", then may proceed with Cephalosporin use.   . Pseudoephedrine Hives, Itching and Rash     CURRENT MEDICATIONS:  Outpatient Encounter Prescriptions as of 06/10/2016  Medication Sig Note  . acetaminophen (TYLENOL) 500 MG tablet Take 500 mg by mouth every 6 (six) hours as needed for moderate pain.   Marland Kitchen ALPRAZolam (XANAX) 0.5 MG tablet Take 1-2 tablets (0.5-1 mg total) by mouth 3 (three) times daily as needed for anxiety.   Marland Kitchen ascorbic acid (VITAMIN C) 1000 MG tablet Take 500-1,000 mg by mouth daily.    Marland Kitchen aspirin EC 81 MG tablet Take 81 mg by mouth daily.   Marland Kitchen BIOTIN PO Take 1 tablet by mouth every morning.   . Calcium Carbonate-Vitamin D (CALCIUM-CARB 600 +  D) 600-125 MG-UNIT TABS Take 1 tablet by mouth daily.    . cetirizine (ZYRTEC) 10 MG tablet take 1 tablet by mouth once daily (Patient taking differently: Take 10 mg by mouth in the morning)   . clindamycin (CLEOCIN T) 1 % lotion As directed   . clobetasol cream (TEMOVATE) 0.05 % As directed   . cycloSPORINE (RESTASIS) 0.05 % ophthalmic emulsion Place 1 drop into both eyes 2 (two) times daily.     . diclofenac sodium (VOLTAREN) 1 % GEL Apply topically. 1% GEL   . metoprolol tartrate (LOPRESSOR) 25 MG tablet Take 1 tablet (25 mg total) by mouth 2 (two) times daily.   . pravastatin (PRAVACHOL) 40 MG tablet take 1 tablet by mouth every evening (Patient taking differently: Take 40 mg by mouth in the evening.)   . PROAIR HFA 108 (90 BASE) MCG/ACT inhaler INHALE 2 PUFFS BY MOUTH IN TO THE LUNGS EVERY 4 HOURS AS NEEDED FOR WHEEZING OR SHORTNESS OF BREATH   . valsartan (DIOVAN) 320 MG tablet Take 1 tablet (320 mg total) by mouth daily.   . vitamin E (VITAMIN E) 400 UNIT capsule Take 400 Units by mouth daily.   Marland Kitchen EPINEPHrine (EPIPEN 2-PAK) 0.3 mg/0.3 mL IJ SOAJ injection Inject 0.3 mg into the muscle once. For anaphylaxis 05/03/2016: Keeps on hand  . fluticasone (FLONASE) 50 MCG/ACT nasal spray Place 1-2 sprays into both nostrils daily. (Patient not taking: Reported on 06/10/2016)   . guaiFENesin-dextromethorphan (ROBITUSSIN DM) 100-10 MG/5ML syrup Take 5 mLs by mouth every 4 (four) hours as needed for cough. (Patient not taking: Reported on 06/10/2016)   . nitroGLYCERIN (NITROSTAT) 0.4 MG SL tablet Place 0.4 mg under the tongue every 5 (five) minutes as needed for chest pain.  05/03/2016: Carries this, but hasn't had to use it in approx 4 years  . polyethylene glycol powder (GLYCOLAX/MIRALAX) powder Take 17gm mixed with water or juice daily (Patient not taking: Reported on 06/10/2016)   . [DISCONTINUED] metoprolol tartrate (LOPRESSOR) 25 MG tablet take 1 tablet by mouth twice a day (Patient not  taking: Reported  on 06/10/2016)    No facility-administered encounter medications on file as of 06/10/2016.      ONCOLOGIC FAMILY HISTORY:  Family History  Problem Relation Age of Onset  . Hypertension Mother   . Heart disease Mother   . Dementia Mother   . Arthritis Mother   . Diabetes Mother   . Colon polyps Mother   . Prostate cancer Father     died of bony mets  . Hypertension Father   . Colon polyps Father   . Lung cancer Maternal Uncle   . Hypertension Sister   . Hypertension Brother   . Heart disease Sister   . Colon cancer Cousin   . Inflammatory bowel disease Sister   . Rectal cancer Neg Hx   . Stomach cancer Neg Hx     GENETIC COUNSELING/TESTING: No records available for review.   SOCIAL HISTORY:  [name] currently lives with her son and daughter-in-law in [place], [place].  She has 2 children, 1 son and 1 daughter. She has been working part-time at [employer] and is hoping to return to work soon. She denies any current tobacco, alcohol, or illicit drug use.     PHYSICAL EXAMINATION:  Vital Signs: Vitals:   06/10/16 1049  BP: (!) 159/91  Pulse: 94  Resp: 19  Temp: 98 F (36.7 C)   Filed Weights   06/10/16 1049  Weight: 175 lb 1.6 oz (79.4 kg)   General: Well-nourished, well-appearing female in no acute distress.  She is unaccompanied today.   HEENT: Head is normocephalic.  Pupils equal and reactive to light. Conjunctivae clear without exudate.  Sclerae anicteric. Oral mucosa is pink, moist.  Oropharynx is pink without lesions or erythema.  Lymph: No cervical, supraclavicular, or infraclavicular lymphadenopathy noted on palpation.  Cardiovascular: Regular rate and rhythm.Marland Kitchen Respiratory: Clear to auscultation bilaterally. Chest expansion symmetric; breathing non-labored.  Breast Exam:  -Left breast: No appreciable masses on palpation. Healed scars without erythema or nodularity (s/p lumpectomy & mammoplasty).  Left intramammary fold with healed scars s/p  shingles. No current vesicles noted.  -Right breast: No appreciable masses on palpation. Healed scars without erythema or nodularity (s/p mammoplasty) -Axilla: No axillary adenopathy bilaterally.  GI: Abdomen soft and round; non-tender, non-distended. Bowel sounds normoactive. No hepatosplenomegaly.   GU: Deferred.  Neuro: No focal deficits. Steady gait.  Psych: Mood and affect normal and appropriate for situation.  Appropriately tearful at times during visit.  Extremities: Trace ankle edema.  Skin: Warm and dry.  LABORATORY DATA:  None for this visit.   DIAGNOSTIC IMAGING:  Most recent mammogram: 05/30/16    ASSESSMENT AND PLAN:  Ms.. [name] is a pleasant 65 y.o. female with history of Stage IIA right breast invasive ductal carcinoma, ER+(1%)/PR-/HER2-, diagnosed in 08/2007, treated with neoadjuvant chemo with FEC x 4, then Taxotere with Xeloda BID x 8 weeks, followed by lumpectomy, then adjuvant radiation therapy. No anti-estrogen therapy; being treated as triple negative given very low ER positivity of 1%.  She presents to the Survivorship Clinic for surveillance and routine follow-up.   1. History of Stage IIA right breast cancer:  [name] is currently clinically and radiographically without evidence of disease or recurrence of breast cancer. She will follow-up with Dr. Lindi Adie in 1 year (based on his previous note that he would like to have alternating visits with survivorship every other year).  Annual mammogram due in 05/2017; orders placed today.  No anti-estrogen therapy as patient being  treated as triple negative.   2. Grief:  [name] has been through a very stressful and traumatic time over the past several weeks. Understandably, she is continuing to grieve the loss of her fiance of 11 years.  The patient tells me that she is scheduled to see her therapist, Kinnie Scales, on 06/23/16 and is looking forward to getting her help and support through this difficult time.  Today, I  provided support for her with active listening, validation of her concerns, and brief expressive supportive counseling.  She has great support in her children; is currently living with her son and daughter-in-law. She is looking forward to getting her own home soon and her children are helping her with this.  Her faith is also very important to her and is providing her with additional comfort during this time.  Encouraged her to reach out to [place] here at the cancer center if we can be of any additional support for her as well.  I will share this update with our oncology social work team so they are aware of what has recently happened in this patient's life.    3. Hypertension/Lower extremity edema:  Her hypertension is a chronic condition and is likely elevated today for several reasons including her most recent traumatic events and her change in diet with more salt consumption recently.  I shared with her that the increased salt intake can certainly increase lower extremity edema and her blood pressure as well.  Encouraged her to drink more water and be more physically active, as tolerated to help with swelling. Encouraged her to mention the LE swelling to her PCP if it does not resolve with these conservative recommendations.   4. Annual flu vaccine:  Flu vaccine given to patient today by LPN.   5. Bone health:  Given Ms. Roberts's age and history of breast cancer, she is at risk for bone demineralization. Given that she does not require anti-estrogen therapy d/t triple negative breast cancer, I will defer any DEXA imaging to her PCP, as clinically indicated.  In the meantime, she was encouraged to increase her consumption of foods rich in calcium, as well as increase her weight-bearing activities.  She was given education on specific food and activities to promote bone health.     Dispo:  -Annual mammogram due in 05/2017; orders placed today  -Return to cancer center to see Dr. Lindi Adie in 05/2017 with  mammogram preceding visit.    A total of 30 minutes of face-to-face time was spent with this patient with greater than 50% of that time in counseling and care-coordination.   Mike Craze, NP Survivorship Program [place] [phone]   Note: PRIMARY CARE PROVIDER Noah Delaine, [place] [phone]

## 2016-06-15 ENCOUNTER — Other Ambulatory Visit: Payer: Self-pay | Admitting: Internal Medicine

## 2016-06-15 DIAGNOSIS — Z0000 Encounter for general adult medical examination without abnormal findings: Secondary | ICD-10-CM

## 2016-06-23 ENCOUNTER — Other Ambulatory Visit: Payer: Self-pay | Admitting: Internal Medicine

## 2016-06-23 ENCOUNTER — Ambulatory Visit (INDEPENDENT_AMBULATORY_CARE_PROVIDER_SITE_OTHER): Payer: Medicare Other | Admitting: Psychiatry

## 2016-06-23 DIAGNOSIS — F4323 Adjustment disorder with mixed anxiety and depressed mood: Secondary | ICD-10-CM | POA: Diagnosis not present

## 2016-06-27 ENCOUNTER — Telehealth: Payer: Self-pay | Admitting: *Deleted

## 2016-06-27 ENCOUNTER — Other Ambulatory Visit: Payer: Self-pay | Admitting: *Deleted

## 2016-06-27 DIAGNOSIS — F411 Generalized anxiety disorder: Secondary | ICD-10-CM

## 2016-06-27 MED ORDER — ALPRAZOLAM 0.5 MG PO TABS: 0.5000 mg | ORAL_TABLET | Freq: Three times a day (TID) | ORAL | 0 refills | Status: DC | PRN

## 2016-06-27 NOTE — Telephone Encounter (Signed)
Called to pharm, informed pt 

## 2016-07-06 ENCOUNTER — Ambulatory Visit (INDEPENDENT_AMBULATORY_CARE_PROVIDER_SITE_OTHER): Payer: Medicare Other | Admitting: Psychiatry

## 2016-07-06 ENCOUNTER — Other Ambulatory Visit: Payer: Self-pay | Admitting: Internal Medicine

## 2016-07-06 DIAGNOSIS — F4323 Adjustment disorder with mixed anxiety and depressed mood: Secondary | ICD-10-CM

## 2016-07-06 NOTE — Telephone Encounter (Signed)
PROAIR HFA 108 (90 BASE) MCG/ACT inhaler  [facility] [place]

## 2016-07-07 MED ORDER — ALBUTEROL SULFATE HFA 108 (90 BASE) MCG/ACT IN AERS: INHALATION_SPRAY | RESPIRATORY_TRACT | 3 refills | Status: DC

## 2016-07-11 DIAGNOSIS — R102 Pelvic and perineal pain: Secondary | ICD-10-CM | POA: Diagnosis not present

## 2016-07-11 DIAGNOSIS — N760 Acute vaginitis: Secondary | ICD-10-CM | POA: Diagnosis not present

## 2016-07-11 DIAGNOSIS — N898 Other specified noninflammatory disorders of vagina: Secondary | ICD-10-CM | POA: Diagnosis not present

## 2016-07-11 DIAGNOSIS — G4709 Other insomnia: Secondary | ICD-10-CM | POA: Diagnosis not present

## 2016-07-11 DIAGNOSIS — Z01419 Encounter for gynecological examination (general) (routine) without abnormal findings: Secondary | ICD-10-CM | POA: Diagnosis not present

## 2016-07-25 ENCOUNTER — Telehealth: Payer: Self-pay

## 2016-07-25 NOTE — Telephone Encounter (Signed)
Please call pt back regarding meds.  

## 2016-07-25 NOTE — Telephone Encounter (Signed)
Have called, lm for rtc

## 2016-07-27 ENCOUNTER — Ambulatory Visit (INDEPENDENT_AMBULATORY_CARE_PROVIDER_SITE_OTHER): Payer: Medicare Other | Admitting: Psychiatry

## 2016-07-27 DIAGNOSIS — F4323 Adjustment disorder with mixed anxiety and depressed mood: Secondary | ICD-10-CM | POA: Diagnosis not present

## 2016-07-28 ENCOUNTER — Telehealth: Payer: Self-pay | Admitting: Internal Medicine

## 2016-07-28 NOTE — Telephone Encounter (Signed)
APT. REMINDER CALL, LMTCB °

## 2016-07-29 ENCOUNTER — Ambulatory Visit (INDEPENDENT_AMBULATORY_CARE_PROVIDER_SITE_OTHER): Payer: Medicare Other | Admitting: Internal Medicine

## 2016-07-29 VITALS — BP 157/91 | HR 81 | Temp 98.0°F | Ht 63.0 in | Wt 178.1 lb

## 2016-07-29 DIAGNOSIS — J322 Chronic ethmoidal sinusitis: Secondary | ICD-10-CM

## 2016-07-29 DIAGNOSIS — Z79899 Other long term (current) drug therapy: Secondary | ICD-10-CM

## 2016-07-29 DIAGNOSIS — J0120 Acute ethmoidal sinusitis, unspecified: Secondary | ICD-10-CM | POA: Diagnosis not present

## 2016-07-29 DIAGNOSIS — Z87891 Personal history of nicotine dependence: Secondary | ICD-10-CM | POA: Diagnosis not present

## 2016-07-29 DIAGNOSIS — B9689 Other specified bacterial agents as the cause of diseases classified elsewhere: Secondary | ICD-10-CM | POA: Diagnosis not present

## 2016-07-29 DIAGNOSIS — Z7982 Long term (current) use of aspirin: Secondary | ICD-10-CM | POA: Diagnosis not present

## 2016-07-29 DIAGNOSIS — I10 Essential (primary) hypertension: Secondary | ICD-10-CM

## 2016-07-29 MED ORDER — LORATADINE 10 MG PO TABS: 10.0000 mg | ORAL_TABLET | Freq: Every day | ORAL | 5 refills | Status: DC

## 2016-07-29 MED ORDER — PANTOPRAZOLE SODIUM 40 MG PO TBEC: 40.0000 mg | DELAYED_RELEASE_TABLET | Freq: Every day | ORAL | 1 refills | Status: DC

## 2016-07-29 MED ORDER — FLUTICASONE PROPIONATE 50 MCG/ACT NA SUSP: 1.0000 | Freq: Every day | NASAL | 3 refills | Status: DC

## 2016-07-29 MED ORDER — VALSARTAN 320 MG PO TABS: 320.0000 mg | ORAL_TABLET | Freq: Every day | ORAL | 1 refills | Status: DC

## 2016-07-29 MED ORDER — ALPRAZOLAM 0.5 MG PO TABS: 0.5000 mg | ORAL_TABLET | Freq: Three times a day (TID) | ORAL | 0 refills | Status: DC | PRN

## 2016-07-29 MED ORDER — METOPROLOL TARTRATE 25 MG PO TABS: 25.0000 mg | ORAL_TABLET | Freq: Two times a day (BID) | ORAL | 1 refills | Status: DC

## 2016-07-29 MED ORDER — OXYMETAZOLINE HCL 0.05 % NA SOLN: 1.0000 | Freq: Two times a day (BID) | NASAL | 0 refills | Status: DC

## 2016-07-29 NOTE — Patient Instructions (Signed)
[name],   For your chronic ethmoid sinusitis, please use your flonase 2 sprays each nostril everyday. Use Afrin twice a day for 3 days then STOP. I have switched your from Zyrtec to Claritin (loratadine) once a day. We have also placed a referral to Allergy.  I have refilled your other prescriptions.

## 2016-07-29 NOTE — Assessment & Plan Note (Signed)
Refilled metoprolol BID and Valsartan QD.

## 2016-07-29 NOTE — Assessment & Plan Note (Signed)
Patient has history of chronic allergic sinusitis which gets worse in the fall and in the spring. She states she has been using her Flonase, Zyrtec, Humidifier and Essential Oils to help, but she continue to have worsening sinus pressure, pain, headache, and nasal congestion despite these measures. It also causes her asthma to flare. She denies fever, chills, cough, shortness of breath, sore throat, nausea or vomiting. On physical exam, she has dull tympanic membranes and erythematous, edematous nasal turbinates.  Assessment: Acute on Chronic Sinusitis  Plan: -Continue Flonase QD -Afrin BID for 3 days then stop -Continue tylenol for headaches -Switch Zyrtec to Claritin QD -Referral to Allergy

## 2016-07-29 NOTE — Assessment & Plan Note (Signed)
Refilled Xanax 0.5 mg TID prn #90 no refills.

## 2016-07-29 NOTE — Progress Notes (Signed)
    CC: Sinus pressure  HPI: [name] is a 65 y.o. female with PMHx of HTN, Chronic Ethmoid Sinusitis, and CAD who presents to the clinic with complaint of worsening sinus pressure and nasal congestions. Please see problem based assessment for more information.  Patient has history of chronic allergic sinusitis which gets worse in the fall and in the spring. She states she has been using her Flonase, Zyrtec, Humidifier and Essential Oils to help, but she continue to have worsening sinus pressure, pain, headache, and nasal congestion despite these measures. It also causes her asthma to flare. She denies fever, chills, cough, shortness of breath, sore throat, nausea or vomiting.   Past Medical History:  Diagnosis Date  . Adenocarcinoma of breast ([place]) 2009   right, s/p chemo/ xrt  . Anal fissure   . Anxiety   . Asthma   . CAD (coronary artery disease)    Nonobstructive on cath 2003 and 2005  . Chronic back pain   . Depression   . Diverticulosis of colon (without mention of hemorrhage)   . Dog bite(E906.0)   . GERD (gastroesophageal reflux disease)   . Headache   . Hiatal hernia   . Hypertension   . Irritable bowel syndrome   . Jaundice    Hx of Jaundice at age 70 from "dirty restuarant". Unsure of Hepatitis type  . Lumbar radiculopathy    bilat LE's  . Neuropathy ([place])    bilat LE's  . Pain management   . Panic attacks   . Spinal stenosis, lumbar region, without neurogenic claudication     Review of Systems: Please see pertinent ROS reviewed in HPI and problem based charting.   Physical Exam: Vitals:   07/29/16 1055  BP: (!) 157/91  Pulse: 81  Temp: 98 F (36.7 C)  TempSrc: Oral  SpO2: 99%  Weight: 178 lb 1.6 oz (80.8 kg)  Height: 5\' 3"  (1.6 m)   General: Vital signs reviewed.  Patient is well-developed and well-nourished, in no acute distress and cooperative with exam.  Head: Normocephalic and atraumatic. Eyes: PERRL, conjunctivae normal, no scleral  icterus.  Nose: Erythematous, edematous nasal turbinates. Ears: Dull tympanic membranes bilaterally. Neck: Supple, trachea midline, no carotid bruit present. No anterior, posterior cervical lymphadenopathy. No supraclavicular lymphadenopathy. Cardiovascular: RRR, S1 normal, S2 normal, no murmurs, gallops, or rubs. Pulmonary/Chest: Clear to auscultation bilaterally, no wheezes, rales, or rhonchi. Abdominal: Soft, non-tender, non-distended, BS + Skin: Warm, dry and intact. No rashes or erythema. Psychiatric: Normal mood and affect. speech and behavior is normal. Cognition and memory are normal.   Assessment & Plan:  See encounters tab for problem based medical decision making. Patient discussed with Dr. Daryll Drown

## 2016-08-02 ENCOUNTER — Telehealth: Payer: Self-pay

## 2016-08-02 NOTE — Telephone Encounter (Signed)
Lm for rtc 

## 2016-08-02 NOTE — Telephone Encounter (Signed)
Needs to speak with a nurse regarding meds. Please call back.  

## 2016-08-03 ENCOUNTER — Other Ambulatory Visit: Payer: Self-pay | Admitting: *Deleted

## 2016-08-03 MED ORDER — PANTOPRAZOLE SODIUM 40 MG PO TBEC: 40.0000 mg | DELAYED_RELEASE_TABLET | Freq: Every day | ORAL | 0 refills | Status: DC

## 2016-08-03 NOTE — Telephone Encounter (Signed)
Her problem list notes GERD, but there has been no problem based charting under this diagnosis.  Thus it is unclear if this medication is still indicated or if safer alternatives would be more appropriate at this time.  I have filled a 90 day supply and will ask Dr. Gay Filler to address the issue at the next visit.  Does she require any therapy for her GERD at this time, and if so would H2 blockers be a better longer term option?

## 2016-08-03 NOTE — Telephone Encounter (Signed)
Which [facility]?  We need this information to key in the pharmacy to send the prescription to.  Thanks.

## 2016-08-05 NOTE — Progress Notes (Signed)
Internal Medicine Clinic Attending  Case discussed with Dr. Burns soon after the resident saw the patient.  We reviewed the resident's history and exam and pertinent patient test results.  I agree with the assessment, diagnosis, and plan of care documented in the resident's note. 

## 2016-08-08 NOTE — Telephone Encounter (Signed)
Spoke w/ pt 11/22, explained meds, agreeable

## 2016-08-17 ENCOUNTER — Ambulatory Visit (INDEPENDENT_AMBULATORY_CARE_PROVIDER_SITE_OTHER): Payer: Medicare Other | Admitting: Pulmonary Disease

## 2016-08-17 ENCOUNTER — Ambulatory Visit (INDEPENDENT_AMBULATORY_CARE_PROVIDER_SITE_OTHER): Payer: Medicare Other | Admitting: Psychiatry

## 2016-08-17 ENCOUNTER — Encounter: Payer: Self-pay | Admitting: Pulmonary Disease

## 2016-08-17 VITALS — BP 134/79 | HR 72 | Temp 98.0°F | Wt 176.9 lb

## 2016-08-17 DIAGNOSIS — J322 Chronic ethmoidal sinusitis: Secondary | ICD-10-CM

## 2016-08-17 DIAGNOSIS — F4323 Adjustment disorder with mixed anxiety and depressed mood: Secondary | ICD-10-CM | POA: Diagnosis not present

## 2016-08-17 DIAGNOSIS — Z833 Family history of diabetes mellitus: Secondary | ICD-10-CM | POA: Diagnosis not present

## 2016-08-17 DIAGNOSIS — Z7982 Long term (current) use of aspirin: Secondary | ICD-10-CM | POA: Diagnosis not present

## 2016-08-17 DIAGNOSIS — Z87891 Personal history of nicotine dependence: Secondary | ICD-10-CM | POA: Diagnosis not present

## 2016-08-17 DIAGNOSIS — J0121 Acute recurrent ethmoidal sinusitis: Secondary | ICD-10-CM

## 2016-08-17 DIAGNOSIS — B9689 Other specified bacterial agents as the cause of diseases classified elsewhere: Secondary | ICD-10-CM

## 2016-08-17 DIAGNOSIS — R7303 Prediabetes: Secondary | ICD-10-CM | POA: Diagnosis not present

## 2016-08-17 DIAGNOSIS — R7309 Other abnormal glucose: Secondary | ICD-10-CM

## 2016-08-17 LAB — POCT GLYCOSYLATED HEMOGLOBIN (HGB A1C): HEMOGLOBIN A1C: 6.2

## 2016-08-17 LAB — GLUCOSE, CAPILLARY: Glucose-Capillary: 110 mg/dL — ABNORMAL HIGH (ref 65–99)

## 2016-08-17 MED ORDER — DOXYCYCLINE HYCLATE 100 MG PO TABS: 100.0000 mg | ORAL_TABLET | Freq: Two times a day (BID) | ORAL | 0 refills | Status: DC

## 2016-08-17 NOTE — Progress Notes (Signed)
   CC: sinus problems  HPI:  [name] is a 65 y.o. woman with asthma, allergic rhinitis presenting with sinus problems.  This has been going on for the past month. She has rhinorrhea - clear. She has post nasal drip. She has had green and dark yellow mucous. She has a cough that is productive and she has not looked at the mucous. She has had chills and subjective fevers. She has not tried saline rinses this time. She has bilateral ear pain. She has had sinus problems for years. She does not have periods where she does not have sinus problems at all - she always has congestion. She has not seen an ear nose throat doctor for 16 years. No history of polyps but there was a cyst that was treated with antibiotics. She quit tobacco 33 years ago.   She has had generalized weakness. She feels like her entire body is swollen that is worse in the afternoon. She has polydipsia and polyuria. Her father and mother both have diabetes.   Past Medical History:  Diagnosis Date  . Adenocarcinoma of breast ([place]) 2009   right, s/p chemo/ xrt  . Anal fissure   . Anxiety   . Asthma   . CAD (coronary artery disease)    Nonobstructive on cath 2003 and 2005  . Chronic back pain   . Depression   . Diverticulosis of colon (without mention of hemorrhage)   . Dog bite(E906.0)   . GERD (gastroesophageal reflux disease)   . Headache   . Hiatal hernia   . Hypertension   . Irritable bowel syndrome   . Jaundice    Hx of Jaundice at age 71 from "dirty restuarant". Unsure of Hepatitis type  . Lumbar radiculopathy    bilat LE's  . Neuropathy ([place])    bilat LE's  . Pain management   . Panic attacks   . Spinal stenosis, lumbar region, without neurogenic claudication     Review of Systems:   No chest pain No nausea  Physical Exam:  Vitals:   08/17/16 0851  BP: 134/79  Pulse: 72  Temp: 98 F (36.7 C)  TempSrc: Oral  SpO2: 98%  Weight: 176 lb 14.4 oz (80.2 kg)   General Apperance: NAD HEENT:  Normocephalic, atraumatic, anicteric sclera, moist mucous membranes, some erythema of the nasal cavity with no drainage appreciated. TM bilaterally with small areas of sclerosis but no erythema, mild bulging. OP clear. Neck: Supple, trachea midline Lungs: Clear to auscultation bilaterally. No wheezes, rhonchi or rales. Breathing comfortably Heart: Regular rate and rhythm, no murmur/rub/gallop Abdomen: Soft, nontender, nondistended, no rebound/guarding Extremities: Warm and well perfused, no edema Skin: No rashes or lesions Neurologic: Alert and interactive. No gross deficits.   Assessment & Plan:   See Encounters Tab for problem based charting.  Patient discussed with Dr. Beryle Beams

## 2016-08-17 NOTE — Progress Notes (Signed)
Medicine attending: Medical history, presenting problems, physical findings, and medications, reviewed with resident physician Dr Jennifer Krall on the day of the patient visit and I concur with her evaluation and management plan. 

## 2016-08-17 NOTE — Assessment & Plan Note (Signed)
Assessment: She has chronic sinusitis given she has nasal congestion for more than 12 weeks at a time. She is on flonase. Acute on chronic sinusitis given her increased symptoms and subjective fevers and chills for the past month. MR head on 8/22 with mild ethmoid sinus mucosal thickening  Plan: Doxycycline 100mg  BID for 7 days. May need longer antibiotic therapy for chronic sinusitis. Saline rinses Flonase daily Referral to ENT

## 2016-08-17 NOTE — Patient Instructions (Signed)
Take your antibiotic twice a day for 7 days. We will refer you to an ear nose throat specialist. Use saline rinses for your sinuses Keep using Flonase daily. Follow up with your primary care provider in 6-8 weeks

## 2016-08-17 NOTE — Assessment & Plan Note (Signed)
Assessment: She has had abnormal blood glucose measurements in the past. Hgb A1c today 6.2%.  Plan: -Discussed with her lifestyle modifications including diet changes and weight loss -Follow up with PCP in 6-8 weeks for further discussion on this.

## 2016-08-18 ENCOUNTER — Other Ambulatory Visit: Payer: Self-pay | Admitting: *Deleted

## 2016-08-18 MED ORDER — PRAVASTATIN SODIUM 40 MG PO TABS: ORAL_TABLET | ORAL | 0 refills | Status: DC

## 2016-08-28 ENCOUNTER — Other Ambulatory Visit: Payer: Self-pay | Admitting: Internal Medicine

## 2016-08-28 DIAGNOSIS — Z79899 Other long term (current) drug therapy: Secondary | ICD-10-CM

## 2016-08-29 ENCOUNTER — Other Ambulatory Visit: Payer: Self-pay

## 2016-08-31 ENCOUNTER — Ambulatory Visit: Payer: Self-pay | Admitting: Psychiatry

## 2016-09-01 ENCOUNTER — Other Ambulatory Visit: Payer: Self-pay

## 2016-09-01 ENCOUNTER — Other Ambulatory Visit: Payer: Self-pay | Admitting: Internal Medicine

## 2016-09-01 DIAGNOSIS — Z79899 Other long term (current) drug therapy: Secondary | ICD-10-CM

## 2016-09-01 MED ORDER — ALPRAZOLAM 0.5 MG PO TABS: ORAL_TABLET | ORAL | 0 refills | Status: DC

## 2016-09-01 NOTE — Telephone Encounter (Signed)
Dr. Gay Filler is on the inpatient service. Please page him to fill his prescription as I do not see a clear indication for it.

## 2016-09-01 NOTE — Telephone Encounter (Signed)
This is the refill we were talking about. Thank you

## 2016-09-01 NOTE — Telephone Encounter (Signed)
Contacted patient and pharmacy, phone call complete.Linda Hidden Cassady12/21/20173:21 PM

## 2016-09-01 NOTE — Telephone Encounter (Signed)
Needs to speak with a nurse regarding meds. Please call back.  

## 2016-09-01 NOTE — Telephone Encounter (Signed)
Rx for 1 month left w/ triage.

## 2016-09-01 NOTE — Telephone Encounter (Signed)
Call from pt requesting an update on her alprazolam refill.  Request is still pending review.  Pt voiced concerned about the difficulty in getting this medication refilled.  I will send info to pcp for review.Despina Hidden Cassady12/21/201711:13 AM

## 2016-09-02 NOTE — Telephone Encounter (Signed)
Alprazolam Called in

## 2016-09-02 NOTE — Telephone Encounter (Signed)
Attempted return patient call and make her aware Xanax RX called in no answer on home or cell phone no voice mail on either.

## 2016-09-06 ENCOUNTER — Ambulatory Visit (INDEPENDENT_AMBULATORY_CARE_PROVIDER_SITE_OTHER): Payer: Medicare Other | Admitting: Psychiatry

## 2016-09-06 DIAGNOSIS — F4323 Adjustment disorder with mixed anxiety and depressed mood: Secondary | ICD-10-CM

## 2016-09-08 ENCOUNTER — Ambulatory Visit: Payer: Medicare Other | Admitting: Psychiatry

## 2016-09-14 DIAGNOSIS — H26492 Other secondary cataract, left eye: Secondary | ICD-10-CM | POA: Diagnosis not present

## 2016-09-14 DIAGNOSIS — Z961 Presence of intraocular lens: Secondary | ICD-10-CM | POA: Diagnosis not present

## 2016-09-14 DIAGNOSIS — H524 Presbyopia: Secondary | ICD-10-CM | POA: Diagnosis not present

## 2016-09-14 DIAGNOSIS — H5203 Hypermetropia, bilateral: Secondary | ICD-10-CM | POA: Diagnosis not present

## 2016-09-16 DIAGNOSIS — R062 Wheezing: Secondary | ICD-10-CM | POA: Diagnosis not present

## 2016-09-16 DIAGNOSIS — J3089 Other allergic rhinitis: Secondary | ICD-10-CM | POA: Diagnosis not present

## 2016-09-16 DIAGNOSIS — K219 Gastro-esophageal reflux disease without esophagitis: Secondary | ICD-10-CM | POA: Diagnosis not present

## 2016-09-16 DIAGNOSIS — R05 Cough: Secondary | ICD-10-CM | POA: Diagnosis not present

## 2016-09-20 ENCOUNTER — Ambulatory Visit (INDEPENDENT_AMBULATORY_CARE_PROVIDER_SITE_OTHER): Payer: Medicare Other | Admitting: Psychiatry

## 2016-09-20 DIAGNOSIS — F4323 Adjustment disorder with mixed anxiety and depressed mood: Secondary | ICD-10-CM

## 2016-09-21 ENCOUNTER — Ambulatory Visit: Payer: Medicare Other | Admitting: Psychiatry

## 2016-09-27 DIAGNOSIS — H26492 Other secondary cataract, left eye: Secondary | ICD-10-CM | POA: Diagnosis not present

## 2016-09-27 DIAGNOSIS — Z961 Presence of intraocular lens: Secondary | ICD-10-CM | POA: Diagnosis not present

## 2016-10-03 DIAGNOSIS — M791 Myalgia: Secondary | ICD-10-CM | POA: Diagnosis not present

## 2016-10-04 ENCOUNTER — Encounter (HOSPITAL_COMMUNITY)
Admission: RE | Disposition: A | Discharge status: Home / Self Care | Payer: Self-pay | Source: Ambulatory Visit | Attending: Ophthalmology

## 2016-10-04 ENCOUNTER — Ambulatory Visit (HOSPITAL_COMMUNITY)
Admission: RE | Admit: 2016-10-04 | Discharge: 2016-10-04 | Disposition: A | Discharge status: Home / Self Care | Payer: Medicare Other | Source: Ambulatory Visit | Attending: Ophthalmology | Admitting: Ophthalmology

## 2016-10-04 DIAGNOSIS — H26493 Other secondary cataract, bilateral: Secondary | ICD-10-CM | POA: Diagnosis not present

## 2016-10-04 DIAGNOSIS — H26492 Other secondary cataract, left eye: Secondary | ICD-10-CM | POA: Diagnosis not present

## 2016-10-04 HISTORY — PX: YAG LASER APPLICATION: SHX6189

## 2016-10-04 SURGERY — TREATMENT, USING YAG LASER
Anesthesia: LOCAL | Laterality: Left

## 2016-10-04 MED ORDER — TROPICAMIDE 1 % OP SOLN
1.0000 [drp] | OPHTHALMIC | Status: AC
  Administered 2016-10-04 (×3): 1 [drp] via OPHTHALMIC

## 2016-10-04 MED ORDER — TETRACAINE HCL 0.5 % OP SOLN
OPHTHALMIC | Status: AC
  Filled 2016-10-04: qty 4

## 2016-10-04 MED ORDER — TETRACAINE HCL 0.5 % OP SOLN
1.0000 [drp] | OPHTHALMIC | Status: AC
  Administered 2016-10-04 (×3): 1 [drp] via OPHTHALMIC

## 2016-10-04 MED ORDER — TROPICAMIDE 1 % OP SOLN
OPHTHALMIC | Status: AC
  Filled 2016-10-04: qty 15

## 2016-10-04 NOTE — Op Note (Signed)
[name] T. Gershon Crane, MD  Procedure: Yag Capsulotomy  Yag Laser Self Test Completedyes. Procedure: Posterior Capsulotomy, Eye Protection Worn by Staff yes. Laser In Use Sign on Door yes.  Laser: Nd:YAG Spot Size: Fixed Burst Mode: III Power Setting: 3.4 mJ/burst Number of shots: 68 Total energy delivered: 225.17 mJ   The patient tolerated the procedure without difficulty. No complications were encountered.   The patient was discharged home with the instructions to continue all her current glaucoma medications, if any.   Patient instructed to go to office at 0100 for intraocular pressure check.  Patient verbalizes understanding of discharge instructions Yes.  .    Pre-Operative Diagnosis: After-Cataract, obscuring vision, 366.53 OS Post-Operative Diagnosis: After-Cataract, obscuring vision, 366.53 OS Date of Cataract Surgery: 05/23/2012

## 2016-10-04 NOTE — H&P (Signed)
The patient was re examined and there is no change in the patients condition since the original H and P. 

## 2016-10-04 NOTE — Discharge Instructions (Signed)
[name]  10/04/2016     Instructions    Activity: No Restrictions.   Diet: Resume Diet you were on at home.   Pain Medication: Tylenol if Needed.   CONTACT YOUR DOCTOR IF YOU HAVE PAIN, REDNESS IN YOUR EYE, OR DECREASED VISION.   Follow-up:today with Rutherford Guys, MD.   Dr. Gershon Crane: [phone]  Dr. Iona HansenJI:[id number]  Dr. Geoffry ParadiseID:[id number]   If you find that you cannot contact your physician, but feel that your signs and   Symptoms warrant a physician's attention, call the Emergency Room at   [phone] ext.[phone].   Othern/a.  Follow up today with Dr. Gershon Crane between 1-2 PM

## 2016-10-05 ENCOUNTER — Telehealth: Payer: Self-pay | Admitting: Internal Medicine

## 2016-10-05 ENCOUNTER — Ambulatory Visit (INDEPENDENT_AMBULATORY_CARE_PROVIDER_SITE_OTHER): Payer: Medicare Other | Admitting: Psychiatry

## 2016-10-05 DIAGNOSIS — F4323 Adjustment disorder with mixed anxiety and depressed mood: Secondary | ICD-10-CM

## 2016-10-05 NOTE — Telephone Encounter (Signed)
APT. REMINDER CALL, LMTCB °

## 2016-10-06 ENCOUNTER — Ambulatory Visit (INDEPENDENT_AMBULATORY_CARE_PROVIDER_SITE_OTHER): Payer: Medicare Other | Admitting: Internal Medicine

## 2016-10-06 ENCOUNTER — Encounter (HOSPITAL_COMMUNITY): Payer: Self-pay | Admitting: Ophthalmology

## 2016-10-06 ENCOUNTER — Other Ambulatory Visit: Payer: Self-pay | Admitting: Internal Medicine

## 2016-10-06 VITALS — BP 167/85 | HR 85 | Temp 98.7°F | Wt 174.9 lb

## 2016-10-06 DIAGNOSIS — Z888 Allergy status to other drugs, medicaments and biological substances status: Secondary | ICD-10-CM

## 2016-10-06 DIAGNOSIS — Z8042 Family history of malignant neoplasm of prostate: Secondary | ICD-10-CM | POA: Diagnosis not present

## 2016-10-06 DIAGNOSIS — I10 Essential (primary) hypertension: Secondary | ICD-10-CM

## 2016-10-06 DIAGNOSIS — Z881 Allergy status to other antibiotic agents status: Secondary | ICD-10-CM

## 2016-10-06 DIAGNOSIS — Z8261 Family history of arthritis: Secondary | ICD-10-CM

## 2016-10-06 DIAGNOSIS — Z79899 Other long term (current) drug therapy: Secondary | ICD-10-CM

## 2016-10-06 DIAGNOSIS — Z91041 Radiographic dye allergy status: Secondary | ICD-10-CM | POA: Diagnosis not present

## 2016-10-06 DIAGNOSIS — Z8371 Family history of colonic polyps: Secondary | ICD-10-CM

## 2016-10-06 DIAGNOSIS — J4530 Mild persistent asthma, uncomplicated: Secondary | ICD-10-CM | POA: Diagnosis not present

## 2016-10-06 DIAGNOSIS — Z87891 Personal history of nicotine dependence: Secondary | ICD-10-CM

## 2016-10-06 DIAGNOSIS — Z7982 Long term (current) use of aspirin: Secondary | ICD-10-CM

## 2016-10-06 DIAGNOSIS — Z91048 Other nonmedicinal substance allergy status: Secondary | ICD-10-CM

## 2016-10-06 DIAGNOSIS — R7303 Prediabetes: Secondary | ICD-10-CM | POA: Diagnosis not present

## 2016-10-06 DIAGNOSIS — Z853 Personal history of malignant neoplasm of breast: Secondary | ICD-10-CM | POA: Diagnosis not present

## 2016-10-06 DIAGNOSIS — Z8379 Family history of other diseases of the digestive system: Secondary | ICD-10-CM

## 2016-10-06 DIAGNOSIS — Z808 Family history of malignant neoplasm of other organs or systems: Secondary | ICD-10-CM

## 2016-10-06 DIAGNOSIS — Z820 Family history of epilepsy and other diseases of the nervous system: Secondary | ICD-10-CM

## 2016-10-06 DIAGNOSIS — F411 Generalized anxiety disorder: Secondary | ICD-10-CM | POA: Diagnosis not present

## 2016-10-06 DIAGNOSIS — Z801 Family history of malignant neoplasm of trachea, bronchus and lung: Secondary | ICD-10-CM | POA: Diagnosis not present

## 2016-10-06 DIAGNOSIS — Z8249 Family history of ischemic heart disease and other diseases of the circulatory system: Secondary | ICD-10-CM

## 2016-10-06 DIAGNOSIS — Z833 Family history of diabetes mellitus: Secondary | ICD-10-CM | POA: Diagnosis not present

## 2016-10-06 DIAGNOSIS — E785 Hyperlipidemia, unspecified: Secondary | ICD-10-CM | POA: Diagnosis not present

## 2016-10-06 DIAGNOSIS — Z886 Allergy status to analgesic agent status: Secondary | ICD-10-CM

## 2016-10-06 DIAGNOSIS — K219 Gastro-esophageal reflux disease without esophagitis: Secondary | ICD-10-CM | POA: Diagnosis not present

## 2016-10-06 DIAGNOSIS — I2510 Atherosclerotic heart disease of native coronary artery without angina pectoris: Secondary | ICD-10-CM | POA: Diagnosis not present

## 2016-10-06 DIAGNOSIS — Z885 Allergy status to narcotic agent status: Secondary | ICD-10-CM

## 2016-10-06 DIAGNOSIS — Z882 Allergy status to sulfonamides status: Secondary | ICD-10-CM

## 2016-10-06 DIAGNOSIS — Z7951 Long term (current) use of inhaled steroids: Secondary | ICD-10-CM

## 2016-10-06 DIAGNOSIS — Z91040 Latex allergy status: Secondary | ICD-10-CM

## 2016-10-06 DIAGNOSIS — Z0000 Encounter for general adult medical examination without abnormal findings: Secondary | ICD-10-CM

## 2016-10-06 DIAGNOSIS — Z880 Allergy status to penicillin: Secondary | ICD-10-CM | POA: Diagnosis not present

## 2016-10-06 DIAGNOSIS — Z800 Family history of malignant neoplasm of digestive organs: Secondary | ICD-10-CM | POA: Diagnosis not present

## 2016-10-06 MED ORDER — BUSPIRONE HCL 5 MG PO TABS: 5.0000 mg | ORAL_TABLET | Freq: Three times a day (TID) | ORAL | 0 refills | Status: DC

## 2016-10-06 MED ORDER — CHLORTHALIDONE 25 MG PO TABS: 25.0000 mg | ORAL_TABLET | Freq: Every day | ORAL | 0 refills | Status: DC

## 2016-10-06 MED ORDER — ALPRAZOLAM 0.5 MG PO TABS: ORAL_TABLET | ORAL | 0 refills | Status: DC

## 2016-10-06 MED ORDER — ZOSTER VACCINE LIVE 19400 UNT/0.65ML ~~LOC~~ SUSR: 0.6500 mL | Freq: Once | SUBCUTANEOUS | 0 refills | Status: AC

## 2016-10-06 NOTE — Assessment & Plan Note (Addendum)
Hemoglobin A1c 6.2 in December 2017. I counseled the patient on dietary and lifestyle modifications. Patient denies any polydipsia or polyuria.  Assessment: Prediabetes  Plan: Dietary and lifestyle modifications, no pharmacologic intervention warranted at this time.

## 2016-10-06 NOTE — Assessment & Plan Note (Signed)
Patient has slightly uncontrolled blood pressure over the last several months. Her blood pressures have been fluctuating systolic [id number] to 123456. Today her blood pressure is mildly elevated at 167/85. She is currently taking valsartan 320 mg daily as well as metoprolol tartrate 25 mg twice a day. She has not had any adverse effects from this. She has no CKD. I would recommend increasing her antihypertensive regimen by adding  Chlorthalidone.  Assessment: Uncontrolled hypertension  Plan: Add chlorthalidone 25 mg daily. Follow-up in 4 weeks for BMP. Continue valsartan 325 mg daily. Continue metoprolol tartrate 25 mg twice a day.

## 2016-10-06 NOTE — Assessment & Plan Note (Signed)
Zostavax prescription sent  Fit-DNA colon cancer screening testing sent.

## 2016-10-06 NOTE — Assessment & Plan Note (Signed)
Patient has a long-standing history of generalized anxiety disorder. She reports that she occasionally gets weepy during the day which is associated with increased stress and thoughts of worry. She takes her Xanax 0.5 mg tablets 3 times daily on a regular basis and this helps to relieve her symptoms. She denies any adverse events such as fatigue or respiratory depression.  Patient follows with a clinical therapist on a biweekly basis and she finds this to be very beneficial.  Patient is also taking no chronic neuro modulating medication for her anxiety and depression symptoms. She has a very long allergy list which is likely exaggerated. I would consider referral to an allergy specialist for skin testing in order to isolate medications which are truly at risk for type I hypersensitivity reaction. On her long medication allergy list are 2 antidepressant medications, Effexor and fluoxetine. One of these is listed as an anaphylactic reaction and the other is a height reaction. This has made her very hesitant to try any other medications to help control her anxiety and depression.  I would recommend a taper of her Xanax prescription in substitution for a medication better suited to long-term control of her symptoms. I will add BuSpar as this will likely have some calming effect for patient with generalized anxiety, and may also have some benefit in her depression. If she continues to have symptoms we can titrate the dose of this medication. If the patient continues to have symptoms of anxiety and depression I would consider referral to psychiatry for further management.  Assessment: Uncontrolled generalized anxiety disorder  Plan: Taper Xanax, start with 75 pill monthly prescription written for twice a day with 15 pills as needed for breakthrough symptoms. Start BuSpar 5 mg daily.

## 2016-10-06 NOTE — Progress Notes (Signed)
CC: routine f/u of BP HPI: [name] is a 66 y.o. female with a h/o of breast CA, HTN, anxiety and depression, GERD who presents for routine f/u of the above medical problems.  Please see Problem-based charting for HPI and the status of patient's chronic medical conditions.  Past Medical History:  Diagnosis Date  . 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 Anal fissure   . Anxiety   . Asthma   . CAD (coronary artery disease)    Nonobstructive on cath 2003 and 2005  . Chronic back pain   . Depression   . Diverticulosis of colon (without mention of hemorrhage)   . Dog bite(E906.0)   . GERD (gastroesophageal reflux disease)   . Headache   . Hiatal hernia   . Hypertension   . Irritable bowel syndrome   . Jaundice    Hx of Jaundice at age 31 from "dirty restuarant". Unsure of Hepatitis type  . Lumbar radiculopathy    bilat LE's  . Neuropathy ([place])    bilat LE's  . Pain management   . Panic attacks   . Spinal stenosis, lumbar region, without neurogenic claudication    Social History  Substance Use Topics  . Smoking status: Former Smoker    Packs/day: 0.30    Years: 8.00    Types: Cigarettes    Quit date: 09/13/1983  . Smokeless tobacco: Never Used  . Alcohol use No   Family History  Problem Relation Age of Onset  . Hypertension Mother   . Heart disease Mother   . Dementia Mother   . Arthritis Mother   . Diabetes Mother   . Colon polyps Mother   . Prostate cancer Father     died of bony mets  . Hypertension Father   . Colon polyps Father   . Lung cancer Maternal Uncle   . Hypertension Sister   . Hypertension Brother   . Heart disease Sister   . Colon cancer Cousin   . Inflammatory bowel disease Sister   . Rectal cancer Neg Hx   . Stomach cancer Neg Hx    Review of Systems: ROS in HPI. Otherwise: Review of Systems  Constitutional: Negative for chills, fever and weight loss.  HENT: Positive for sore throat. Negative for  congestion and sinus pain.   Respiratory: Negative for cough and shortness of breath.   Cardiovascular: Negative for chest pain and leg swelling.  Gastrointestinal: Negative for abdominal pain, constipation, diarrhea, nausea and vomiting.  Genitourinary: Negative for dysuria, frequency and urgency.  Skin: Positive for rash.  Psychiatric/Behavioral: Positive for depression. The patient is nervous/anxious. The patient does not have insomnia.    Physical Exam: Vitals:   10/06/16 1505  BP: (!) 167/85  Pulse: 85  Temp: 98.7 F (37.1 C)  TempSrc: Oral  SpO2: 100%  Weight: 174 lb 14.4 oz (79.3 kg)   Physical Exam  Constitutional: She appears well-developed. She is cooperative. No distress.  HENT:  Mouth/Throat: No posterior oropharyngeal edema. Tonsils are 0 on the right. Tonsils are 0 on the left.  Few scattered white patches on the posterior OP. Erythematous OP.  Cardiovascular: Normal rate, regular rhythm, normal heart sounds and normal pulses.  Exam reveals no gallop.   No murmur heard. Pulmonary/Chest: Effort normal and breath sounds normal. No respiratory distress. She has no wheezes. She has no rhonchi. She has no rales. Breasts are symmetrical.  Abdominal: Soft. Bowel sounds are normal. There  is no tenderness.  Musculoskeletal: She exhibits no edema.  Psychiatric: She has a normal mood and affect. Her speech is normal.    Assessment & Plan:  See encounters tab for problem based medical decision making. Patient discussed with Dr. Evette Doffing  Health care maintenance Zostavax prescription sent  Fit-DNA colon cancer screening testing sent.  Asthma Currently doing well on Breo Ellipta. Patient denies any recent exacerbations. She denies shortness of breath, wheezing, chest pain. On exam today, she is clear to auscultation and has no increased work of breathing. She reports that she uses her inhaler regularly.  Assessment: Well controlled mild persistent asthma  Plan: Continue  Breo Ellipta controller inhaler. Use albuterol when necessary.  Pre-diabetes Hemoglobin A1c 6.2 in December 2017. I counseled the patient on dietary and lifestyle modifications. Patient denies any polydipsia or polyuria.  Assessment: Prediabetes  Plan: Dietary and lifestyle modifications, no pharmacologic intervention warranted at this time.  Generalized anxiety disorder Patient has a long-standing history of generalized anxiety disorder. She reports that she occasionally gets weepy during the day which is associated with increased stress and thoughts of worry. She takes her Xanax 0.5 mg tablets 3 times daily on a regular basis and this helps to relieve her symptoms. She denies any adverse events such as fatigue or respiratory depression.  Patient follows with a clinical therapist on a biweekly basis and she finds this to be very beneficial.  Patient is also taking no chronic neuro modulating medication for her anxiety and depression symptoms. She has a very long allergy list which is likely exaggerated. I would consider referral to an allergy specialist for skin testing in order to isolate medications which are truly at risk for type I hypersensitivity reaction. On her long medication allergy list are 2 antidepressant medications, Effexor and fluoxetine. One of these is listed as an anaphylactic reaction and the other is a height reaction. This has made her very hesitant to try any other medications to help control her anxiety and depression.  I would recommend a taper of her Xanax prescription in substitution for a medication better suited to long-term control of her symptoms. I will add BuSpar as this will likely have some calming effect for patient with generalized anxiety, and may also have some benefit in her depression. If she continues to have symptoms we can titrate the dose of this medication. If the patient continues to have symptoms of anxiety and depression I would consider referral to  psychiatry for further management.  Assessment: Uncontrolled generalized anxiety disorder  Plan: Taper Xanax, start with 75 pill monthly prescription written for twice a day with 15 pills as needed for breakthrough symptoms. Start BuSpar 5 mg daily.  GERD (gastroesophageal reflux disease) Patient continues to report symptoms of acid reflux. Specifically she notes that she has had chronic cough which has worsened in the was recently. She has a history of esophageal strictures which been dilated by Dr. Hilarie Fredrickson on EGD most recently 12/17/2014. She has an upcoming appointment to follow-up with Dr. Hilarie Fredrickson. She is currently on once daily Protonix and feels that this medication is helpful.  Assessment: Uncontrolled GERD  Plan: Consider increasing Protonix to twice a day, will await evaluation by GI upcoming appointment. For now continue once daily Protonix 40 mg.  Essential hypertension Patient has slightly uncontrolled blood pressure over the last several months. Her blood pressures have been fluctuating systolic [id number] to 123456. Today her blood pressure is mildly elevated at 167/85. She is currently taking valsartan 320 mg  daily as well as metoprolol tartrate 25 mg twice a day. She has not had any adverse effects from this. She has no CKD. I would recommend increasing her antihypertensive regimen by adding  Chlorthalidone.  Assessment: Uncontrolled hypertension  Plan: Add chlorthalidone 25 mg daily. Follow-up in 4 weeks for BMP. Continue valsartan 325 mg daily. Continue metoprolol tartrate 25 mg twice a day.  Coronary atherosclerosis Patient has hyperlipidemia and known coronary artery disease. She is currently on secondary prevention with of statin 40 mg. Last lipid panel was in June 2017.  Assessment: Stable CAD  Plan: Continue pravastatin 40 mg daily.   Signed: Holley Raring, MD 10/06/2016, 5:48 PM  Pager: [phone]

## 2016-10-06 NOTE — Assessment & Plan Note (Signed)
Patient continues to report symptoms of acid reflux. Specifically she notes that she has had chronic cough which has worsened in the was recently. She has a history of esophageal strictures which been dilated by Dr. Hilarie Fredrickson on EGD most recently 12/17/2014. She has an upcoming appointment to follow-up with Dr. Hilarie Fredrickson. She is currently on once daily Protonix and feels that this medication is helpful.  Assessment: Uncontrolled GERD  Plan: Consider increasing Protonix to twice a day, will await evaluation by GI upcoming appointment. For now continue once daily Protonix 40 mg.

## 2016-10-06 NOTE — Patient Instructions (Signed)
It was good to see you in the clinic today. We will make a small changes to your anxiety medication. I will give you a prescription for 75 tablets of Xanax. I would plan to take this 2 times every day and you'll have 15 extra pills that she can take as needed on bad days. We will also start a new medication called BuSpar. You will take 5 mg of this medication every day.  We will also make a small change to your blood pressure medication by adding a medicine called chlorthalidone. You will take 25 mg of this pill every day, in addition to her other medicines.  We'll plan to see you back in clinic in 1 month. I may not be available in the clinic at that time, so one of my colleagues will see you to adjust her blood pressure medicine medication and your BuSpar medication as needed.

## 2016-10-06 NOTE — Assessment & Plan Note (Signed)
Patient has hyperlipidemia and known coronary artery disease. She is currently on secondary prevention with of statin 40 mg. Last lipid panel was in June 2017.  Assessment: Stable CAD  Plan: Continue pravastatin 40 mg daily.

## 2016-10-06 NOTE — Assessment & Plan Note (Addendum)
Currently doing well on Breo Ellipta. Patient denies any recent exacerbations. She denies shortness of breath, wheezing, chest pain. On exam today, she is clear to auscultation and has no increased work of breathing. She reports that she uses her inhaler regularly.  Assessment: Well controlled mild persistent asthma  Plan: Continue Breo Ellipta controller inhaler. Use albuterol when necessary.

## 2016-10-07 NOTE — Progress Notes (Signed)
Internal Medicine Clinic Attending  Case discussed with Dr. Strelow at the time of the visit.  We reviewed the resident's history and exam and pertinent patient test results.  I agree with the assessment, diagnosis, and plan of care documented in the resident's note.  

## 2016-10-10 ENCOUNTER — Telehealth: Payer: Self-pay | Admitting: Internal Medicine

## 2016-10-10 NOTE — Telephone Encounter (Signed)
Megan from [employer] would like a call back .

## 2016-10-10 NOTE — Telephone Encounter (Signed)
Spoke w/rep, wanted to know if there were questions about new med dr ordered- referred to dr Maudie Mercury

## 2016-10-12 DIAGNOSIS — M533 Sacrococcygeal disorders, not elsewhere classified: Secondary | ICD-10-CM | POA: Insufficient documentation

## 2016-10-14 ENCOUNTER — Telehealth: Payer: Self-pay | Admitting: *Deleted

## 2016-10-14 NOTE — Telephone Encounter (Signed)
Courtesy call made to patient to see if she had received her Colog[employer]ard kit.  Order was faxed to [facility] on 10/07/2016.  Pt received kit yesterday and intends to complete and mail it back some time next week.   Will continue to follow up.  Phone call complete.[place], Darlene Cassady2/2/201810:53 AM   

## 2016-10-17 ENCOUNTER — Other Ambulatory Visit: Payer: Self-pay | Admitting: Internal Medicine

## 2016-10-17 DIAGNOSIS — Z1212 Encounter for screening for malignant neoplasm of rectum: Secondary | ICD-10-CM | POA: Diagnosis not present

## 2016-10-17 DIAGNOSIS — Z1211 Encounter for screening for malignant neoplasm of colon: Secondary | ICD-10-CM | POA: Diagnosis not present

## 2016-10-17 MED ORDER — BUSPIRONE HCL 5 MG PO TABS: 5.0000 mg | ORAL_TABLET | Freq: Three times a day (TID) | ORAL | 1 refills | Status: DC

## 2016-10-17 NOTE — Telephone Encounter (Signed)
busPIRone (BUSPAR) 5 MG tablet  [facility]

## 2016-10-18 DIAGNOSIS — Z888 Allergy status to other drugs, medicaments and biological substances status: Secondary | ICD-10-CM | POA: Diagnosis not present

## 2016-10-18 DIAGNOSIS — M533 Sacrococcygeal disorders, not elsewhere classified: Secondary | ICD-10-CM | POA: Diagnosis not present

## 2016-10-18 DIAGNOSIS — Z7982 Long term (current) use of aspirin: Secondary | ICD-10-CM | POA: Diagnosis not present

## 2016-10-18 DIAGNOSIS — Z87891 Personal history of nicotine dependence: Secondary | ICD-10-CM | POA: Diagnosis not present

## 2016-10-18 DIAGNOSIS — G894 Chronic pain syndrome: Secondary | ICD-10-CM | POA: Diagnosis not present

## 2016-10-18 DIAGNOSIS — Z882 Allergy status to sulfonamides status: Secondary | ICD-10-CM | POA: Diagnosis not present

## 2016-10-18 DIAGNOSIS — M461 Sacroiliitis, not elsewhere classified: Secondary | ICD-10-CM | POA: Diagnosis not present

## 2016-10-18 DIAGNOSIS — Z881 Allergy status to other antibiotic agents status: Secondary | ICD-10-CM | POA: Diagnosis not present

## 2016-10-18 DIAGNOSIS — Z79899 Other long term (current) drug therapy: Secondary | ICD-10-CM | POA: Diagnosis not present

## 2016-10-18 DIAGNOSIS — Z91048 Other nonmedicinal substance allergy status: Secondary | ICD-10-CM | POA: Diagnosis not present

## 2016-10-18 DIAGNOSIS — I252 Old myocardial infarction: Secondary | ICD-10-CM | POA: Diagnosis not present

## 2016-10-18 DIAGNOSIS — Z853 Personal history of malignant neoplasm of breast: Secondary | ICD-10-CM | POA: Diagnosis not present

## 2016-10-18 DIAGNOSIS — Z880 Allergy status to penicillin: Secondary | ICD-10-CM | POA: Diagnosis not present

## 2016-10-18 DIAGNOSIS — I10 Essential (primary) hypertension: Secondary | ICD-10-CM | POA: Diagnosis not present

## 2016-10-18 DIAGNOSIS — Z885 Allergy status to narcotic agent status: Secondary | ICD-10-CM | POA: Diagnosis not present

## 2016-10-18 DIAGNOSIS — Z91041 Radiographic dye allergy status: Secondary | ICD-10-CM | POA: Diagnosis not present

## 2016-10-19 ENCOUNTER — Ambulatory Visit: Payer: Medicare Other | Admitting: Psychiatry

## 2016-10-20 ENCOUNTER — Ambulatory Visit (INDEPENDENT_AMBULATORY_CARE_PROVIDER_SITE_OTHER): Payer: Medicare Other | Admitting: Psychiatry

## 2016-10-20 DIAGNOSIS — F4323 Adjustment disorder with mixed anxiety and depressed mood: Secondary | ICD-10-CM

## 2016-10-24 MED ORDER — TROPICAMIDE 1 % OP SOLN: 1.0000 [drp] | OPHTHALMIC | Status: AC

## 2016-10-24 NOTE — Progress Notes (Signed)
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   Pharmacy notified.  States that its okay to give tropicamide eye drops for YAG  Procedure due to pt receiving it on 10/04/2016 without any complications.Will follow up with Dr Gershon Crane in the am.

## 2016-10-25 ENCOUNTER — Encounter (HOSPITAL_COMMUNITY)
Admission: RE | Disposition: A | Discharge status: Home / Self Care | Payer: Self-pay | Source: Ambulatory Visit | Attending: Ophthalmology

## 2016-10-25 ENCOUNTER — Ambulatory Visit (HOSPITAL_COMMUNITY)
Admission: RE | Admit: 2016-10-25 | Discharge: 2016-10-25 | Disposition: A | Discharge status: Home / Self Care | Payer: Medicare Other | Source: Ambulatory Visit | Attending: Ophthalmology | Admitting: Ophthalmology

## 2016-10-25 DIAGNOSIS — H26491 Other secondary cataract, right eye: Secondary | ICD-10-CM | POA: Diagnosis not present

## 2016-10-25 HISTORY — PX: YAG LASER APPLICATION: SHX6189

## 2016-10-25 SURGERY — TREATMENT, USING YAG LASER
Anesthesia: LOCAL | Laterality: Right

## 2016-10-25 MED ORDER — TROPICAMIDE 1 % OP SOLN
1.0000 [drp] | OPHTHALMIC | Status: AC
  Administered 2016-10-25 (×3): 1 [drp] via OPHTHALMIC

## 2016-10-25 MED ORDER — TROPICAMIDE 1 % OP SOLN
OPHTHALMIC | Status: AC
  Filled 2016-10-25: qty 15

## 2016-10-25 MED ORDER — TETRACAINE HCL 0.5 % OP SOLN: 1.0000 [drp] | OPHTHALMIC | Status: DC | PRN

## 2016-10-25 NOTE — Discharge Instructions (Signed)
PRACHI ASSINK  10/25/2016     Instructions    Activity: No Restrictions.   Diet: Resume Diet you were on at home.   Pain Medication: Tylenol if Needed.   CONTACT YOUR DOCTOR IF YOU HAVE PAIN, REDNESS IN YOUR EYE, OR DECREASED VISION.   Follow-up:today with Rutherford Guys, MD.   Dr. Gershon Crane: [phone]  Dr. Iona HansenJI:[id number]  Dr. Geoffry ParadiseID:[id number]   If you find that you cannot contact your physician, but feel that your signs and   Symptoms warrant a physician's attention, call the Emergency Room at   [phone] ext.[phone].   Othern/a.  FOLLOW  UP WITH DR [facility] TODAY AT HIS OFFICE AT 1:00 PM

## 2016-10-25 NOTE — H&P (Signed)
The patient was re examined and there is no change in the patients condition since the original H and P. 

## 2016-10-25 NOTE — Op Note (Signed)
Linda Latino T. Gershon Crane, MD  Procedure: Yag Capsulotomy  Yag Laser Self Test Completedyes. Procedure: Posterior Capsulotomy, Eye Protection Worn by Staff yes. Laser In Use Sign on Door yes.  Laser: Nd:YAG Spot Size: Fixed Burst Mode: III Power Setting: 3.4 mJ/burst Number of shots: 20 Total energy delivered: 62.66 mJ   The patient tolerated the procedure without difficulty. No complications were encountered.   The patient was discharged home with the instructions to continue all her current glaucoma medications, if any.   Patient instructed to go to office at 0100 for intraocular pressure check.  Patient verbalizes understanding of discharge instructions Yes.  .   Notes:  Pre-Operative Diagnosis: After-Cataract, obscuring vision, 366.53 OD Post-Operative Diagnosis: After-Cataract, obscuring vision, 366.53 OD Date of Cataract Surgery: 05/16/2012

## 2016-10-26 ENCOUNTER — Encounter (HOSPITAL_COMMUNITY): Payer: Self-pay | Admitting: Ophthalmology

## 2016-11-02 ENCOUNTER — Telehealth: Payer: Self-pay | Admitting: Internal Medicine

## 2016-11-02 NOTE — Telephone Encounter (Signed)
APT. REMINDER CALL, NO ANSWER, NO VOICEMAIL °

## 2016-11-03 ENCOUNTER — Ambulatory Visit (INDEPENDENT_AMBULATORY_CARE_PROVIDER_SITE_OTHER): Payer: Medicare Other | Admitting: Internal Medicine

## 2016-11-03 VITALS — BP 144/79 | HR 78 | Temp 98.7°F | Wt 172.5 lb

## 2016-11-03 DIAGNOSIS — Z880 Allergy status to penicillin: Secondary | ICD-10-CM

## 2016-11-03 DIAGNOSIS — H93291 Other abnormal auditory perceptions, right ear: Secondary | ICD-10-CM | POA: Diagnosis not present

## 2016-11-03 DIAGNOSIS — I10 Essential (primary) hypertension: Secondary | ICD-10-CM | POA: Diagnosis not present

## 2016-11-03 DIAGNOSIS — R42 Dizziness and giddiness: Secondary | ICD-10-CM | POA: Diagnosis not present

## 2016-11-03 DIAGNOSIS — H9203 Otalgia, bilateral: Secondary | ICD-10-CM | POA: Diagnosis not present

## 2016-11-03 DIAGNOSIS — Z7982 Long term (current) use of aspirin: Secondary | ICD-10-CM

## 2016-11-03 DIAGNOSIS — Z79899 Other long term (current) drug therapy: Secondary | ICD-10-CM

## 2016-11-03 DIAGNOSIS — Z87891 Personal history of nicotine dependence: Secondary | ICD-10-CM

## 2016-11-03 MED ORDER — DOXYCYCLINE HYCLATE 100 MG PO TABS: 100.0000 mg | ORAL_TABLET | Freq: Two times a day (BID) | ORAL | 0 refills | Status: DC

## 2016-11-03 NOTE — Progress Notes (Addendum)
   CC: dizziness   HPI: [name] is a 66 y.o. with past medical history as outlined below who presents to clinic with concern for dizziness for the past 2 days. The dizziness is not constant throughout the day, she feels it when she bends over. She has felt intermittently off balance while walking and has has swayed towards her left side twice. She has felt pressure in her ears with right ear hearing change. She denies fever, sore throat or cough. She has a history of recurrent sinus infections where she feels ear fullness similar to this, she is allergic to penicillin and has taken doxycycline in the past.   Please see problem list for status of the pt's chronic medical problems.  Past Medical History:  Diagnosis Date  . 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 Anal fissure   . Anxiety   . Asthma   . CAD (coronary artery disease)    Nonobstructive on cath 2003 and 2005  . Chronic back pain   . Depression   . Diverticulosis of colon (without mention of hemorrhage)   . Dog bite(E906.0)   . GERD (gastroesophageal reflux disease)   . Headache   . Hiatal hernia   . Hypertension   . Irritable bowel syndrome   . Jaundice    Hx of Jaundice at age 32 from "dirty restuarant". Unsure of Hepatitis type  . Lumbar radiculopathy    bilat LE's  . Neuropathy ([place])    bilat LE's  . Pain management   . Panic attacks   . Spinal stenosis, lumbar region, without neurogenic claudication     Review of Systems:  Please see each problem below for a pertinent review of systems.  Physical Exam:  Vitals:   11/03/16 1451  BP: (!) 144/79  Pulse: 78  Temp: 98.7 F (37.1 C)  TempSrc: Oral  SpO2: 99%  Weight: 172 lb 8 oz (78.2 kg)   Physical Exam  Constitutional: She appears well-developed and well-nourished. No distress.  HENT:  Mouth/Throat: No oropharyngeal exudate.  Mastoid tenderness right > left.  Bilateral ear canals are grey and shiny with cone of light.  There is no effusion but some sclerotic changes External ear canal has no deformity or erythema  No maxillary or frontal sinus tenderness   Pulmonary/Chest: Effort normal and breath sounds normal. No respiratory distress.  Lymphadenopathy:    She has no cervical adenopathy.  Skin: She is not diaphoretic.   Assessment & Plan:   See Encounters Tab for problem based charting.  Dizziness  Dizziness, changes in hearing, and mastoid tenderness may be related to otitis media. She is requesting antibiotics, has a penicillin allergy and request doxycycline.  - prescribed doxycycline for 5 days   Hypertension  Blood pressure remains elevated on current regiment of chlorthalidone 25 mg daily, valsartan 325 mg daily, and metoprolol tartrate 25 mg daily.  - ordered BMP today - calcium slightly elevated 10.4, Na and K within normal limits, crt 0.82  - increase to chlorthalidone 50 mg daily  - continue valsartan and metoprolol tartrate   Patient seen with Dr. Evette Doffing

## 2016-11-03 NOTE — Patient Instructions (Signed)
It was a pleasure to meet you today [name],   For your high blood pressure,  You are having lab work today, I will call you with the results of this labwork if we are going to change your blood pressure medications.  For your ear pain,  We have given you a prescription for doxycycline  Please schedule a follow up visit in 1 month

## 2016-11-04 ENCOUNTER — Telehealth: Payer: Self-pay | Admitting: *Deleted

## 2016-11-04 ENCOUNTER — Other Ambulatory Visit: Payer: Self-pay | Admitting: Internal Medicine

## 2016-11-04 LAB — BMP8+ANION GAP
ANION GAP: 14 mmol/L (ref 10.0–18.0)
BUN / CREAT RATIO: 21 (ref 12–28)
BUN: 17 mg/dL (ref 8–27)
CHLORIDE: 99 mmol/L (ref 96–106)
CO2: 29 mmol/L (ref 18–29)
Calcium: 10.4 mg/dL — ABNORMAL HIGH (ref 8.7–10.3)
Creatinine, Ser: 0.82 mg/dL (ref 0.57–1.00)
GFR calc Af Amer: 88 mL/min/{1.73_m2} (ref >59)
GFR calc non Af Amer: 76 mL/min/{1.73_m2} (ref >59)
GLUCOSE: 112 mg/dL — AB (ref 65–99)
Potassium: 3.9 mmol/L (ref 3.5–5.2)
Sodium: 142 mmol/L (ref 134–144)

## 2016-11-04 NOTE — Telephone Encounter (Signed)
Call from pt - stated abx for Doxycycline was ordered but was not sent to her pharmacy. Apparently it was sent to [facility] yesterday. Pt stated her pharmacy is [facility] on [address].; told her I will call rx to requested pharmacy.  Doxycycline 100 mg called to [facility] pharmacy [phone]).

## 2016-11-05 NOTE — Telephone Encounter (Signed)
Okay, thank you!

## 2016-11-07 ENCOUNTER — Other Ambulatory Visit: Payer: Self-pay | Admitting: *Deleted

## 2016-11-08 MED ORDER — ALPRAZOLAM 0.5 MG PO TABS: ORAL_TABLET | ORAL | 0 refills | Status: DC

## 2016-11-08 MED ORDER — CHLORTHALIDONE 25 MG PO TABS: 25.0000 mg | ORAL_TABLET | Freq: Every day | ORAL | 2 refills | Status: DC

## 2016-11-09 ENCOUNTER — Telehealth: Payer: Self-pay

## 2016-11-09 MED ORDER — CHLORTHALIDONE 50 MG PO TABS
50.0000 mg | ORAL_TABLET | Freq: Every day | ORAL | 3 refills | Status: DC
Start: 2016-11-09 — End: 2017-03-01

## 2016-11-09 NOTE — Telephone Encounter (Signed)
Pt states ALPRAZolam (XANAX) 0.5 MG tablet, is not at the pharmacy. Please call pt back.

## 2016-11-09 NOTE — Assessment & Plan Note (Addendum)
Concerns for dizziness for the past 2 days. The dizziness is not constant throughout the day, she feels it when she bends over. She has felt intermittently off balance while walking and has has swayed towards her left side twice. She has felt pressure in her ears with right ear hearing change. She denies fever, sore throat or cough. She has a history of recurrent sinus infections where she feels ear fullness similar to this, she is allergic to penicillin and has taken doxycycline in the past.   Dizziness, changes in hearing, and mastoid tenderness may be related to otitis media. She is requesting antibiotics, has a penicillin allergy and request doxycycline.  - prescribed doxycycline for 5 days

## 2016-11-09 NOTE — Assessment & Plan Note (Addendum)
Blood pressure remains elevated on current regiment of chlorthalidone 25 mg daily, valsartan 325 mg daily, and metoprolol tartrate 25 mg daily.  - ordered BMP today - calcium slightly elevated 10.4, Na and K within normal limits, crt 0.82  - increase to chlorthalidone 50 mg daily  - continue valsartan and metoprolol tartrate

## 2016-11-09 NOTE — Progress Notes (Signed)
Internal Medicine Clinic Attending  I saw and evaluated the patient.  I personally confirmed the key portions of the history and exam documented by Dr. Blum and I reviewed pertinent patient test results.  The assessment, diagnosis, and plan were formulated together and I agree with the documentation in the resident's note. 

## 2016-11-09 NOTE — Telephone Encounter (Signed)
Alprazolam called in to [facility].

## 2016-11-09 NOTE — Telephone Encounter (Signed)
Charleston Ropes called this in

## 2016-11-09 NOTE — Telephone Encounter (Signed)
Called pharmacy they have not gotten the script, gave it to them verbally, called pt

## 2016-11-14 ENCOUNTER — Other Ambulatory Visit: Payer: Medicare Other

## 2016-11-14 ENCOUNTER — Encounter: Payer: Self-pay | Admitting: Internal Medicine

## 2016-11-14 ENCOUNTER — Ambulatory Visit (INDEPENDENT_AMBULATORY_CARE_PROVIDER_SITE_OTHER): Payer: Medicare Other | Admitting: Internal Medicine

## 2016-11-14 VITALS — BP 116/74 | HR 72 | Ht 63.0 in | Wt 174.0 lb

## 2016-11-14 DIAGNOSIS — R1030 Lower abdominal pain, unspecified: Secondary | ICD-10-CM

## 2016-11-14 DIAGNOSIS — I2510 Atherosclerotic heart disease of native coronary artery without angina pectoris: Secondary | ICD-10-CM | POA: Diagnosis not present

## 2016-11-14 DIAGNOSIS — R195 Other fecal abnormalities: Secondary | ICD-10-CM

## 2016-11-14 DIAGNOSIS — K219 Gastro-esophageal reflux disease without esophagitis: Secondary | ICD-10-CM

## 2016-11-14 NOTE — Patient Instructions (Addendum)
Your physician has requested that you go to the basement for the following lab work before leaving today: GI Pathogen panel  Continue pantoprazole 40 mg every morning.  Please purchase the following medications over the counter and take as directed: Ranitidine 150 mg every evening as needed for breakthrough reflux  Follow up with Dr Hilarie Fredrickson on Thursday, 4/169/18 @ 9:00 am.  If you are age 64 or older, your body mass index should be between 23-30. Your Body mass index is 30.82 kg/m. If this is out of the aforementioned range listed, please consider follow up with your Primary Care Provider.  If you are age 31 or younger, your body mass index should be between 19-25. Your Body mass index is 30.82 kg/m. If this is out of the aformentioned range listed, please consider follow up with your Primary Care Provider.

## 2016-11-14 NOTE — Progress Notes (Signed)
Subjective:    Patient ID: [name], female    DOB: 1951/02/17, 66 y.o.   MRN: [id number]  HPI [name] is a 66 year old female with a history of IBS, history of constipation with abdominal bloating, history of gastritis and history of Candida esophagitis with dysphagia who is here for follow-up. She was last seen in the office in June 2016.  She reports that most recently she has been having issues with lower abdomen, no pain, worse on the right side. This pain comes and goes and seems to be slightly worse with eating. She has associated abdominal bloating. Her bowel movements have been loose without blood or melena. This is atypical for her as she has a history of constipation. She reports on and off loose stools but 2 weeks symptoms have been seemingly worse. She did recently complete doxycycline for a sinus infection. She has not been using MiraLAX and has not felt constipated "quite some time". She is noticing some lower bilateral crampy abdominal pain prior to bowel movement which often resolves or improves with bowel movement. Milk also seems to make her symptoms worse. She continues to take pantoprazole 40 mg per day which controls her heartburn most days though she will occasionally have late evening breakthrough regurgitation or heartburn. She denies dysphagia or odynophagia.  Her last colonoscopy was August 2013 moderate left-sided diverticulosis  Review of Systems As per history of present illness, otherwise negative  Current Medications, Allergies, Past Medical History, Past Surgical History, Family History and Social History were reviewed in [employer] record.     Objective:   Physical Exam BP 116/74 (BP Location: Left Arm, Patient Position: Sitting, Cuff Size: Normal)   Pulse 72   Ht 5\' 3"  (1.6 m)   Wt 174 lb (78.9 kg)   BMI 30.82 kg/m  Constitutional: Well-developed and well-nourished. No distress. HEENT: Normocephalic and atraumatic.   Conjunctivae are normal.  No scleral icterus. Neck: Neck supple. Trachea midline. Cardiovascular: Normal rate, regular rhythm and intact distal pulses. No M/R/G Pulmonary/chest: Effort normal and breath sounds normal. No wheezing, rales or rhonchi. Abdominal: Soft, Mild diffuse tenderness right greater than left without rebound or guarding, nondistended. Bowel sounds active throughout. Well-healed incision right upper quadrant Extremities: no clubbing, cyanosis, or edema Neurological: Alert and oriented to person place and time. Skin: Skin is warm and dry. No rashes noted. Psychiatric: Normal mood and affect. Behavior is normal.      Assessment & Plan:   66 year old female with a history of IBS, history of constipation with abdominal bloating, history of gastritis and history of Candida esophagitis with dysphagia who is here for follow-up.  1. Right lower quadrant abdominal pain/loose stools -- I recommended a GI pathogen panel to evaluate for infection, particular given recent antibiotics. Rule out C. difficile. Also rule out Giardia. She has allergy to Bentyl and Librax and therefore I did not prescribe anti-spasmodic. We discussed Levsin but given her previous intolerance to other anti-spasmodics this was deferred. I recommended that she avoid milk products. If GI pathogen panel negative I will try rifaximin 550 mg 3 times a day 14 days. If symptoms worsen she is asked to notify me and I would consider CT scan of the abdomen and pelvis.  2. GERD -- for the most part well controlled on pantoprazole with occasional breakthrough. Continue pantoprazole 40 mg daily 30 months before breakfast. Add Zantac 150 in the evening as needed for breakthrough heartburn.  6-8 week follow-up with me or  advanced practitioner, sooner if needed 25 minutes spent with the patient today. Greater than 50% was spent in counseling and coordination of care with the patient

## 2016-11-17 ENCOUNTER — Ambulatory Visit (INDEPENDENT_AMBULATORY_CARE_PROVIDER_SITE_OTHER): Payer: Medicare Other | Admitting: Psychiatry

## 2016-11-17 ENCOUNTER — Other Ambulatory Visit: Payer: Medicare Other

## 2016-11-17 DIAGNOSIS — R195 Other fecal abnormalities: Secondary | ICD-10-CM | POA: Diagnosis not present

## 2016-11-17 DIAGNOSIS — K219 Gastro-esophageal reflux disease without esophagitis: Secondary | ICD-10-CM

## 2016-11-17 DIAGNOSIS — F4323 Adjustment disorder with mixed anxiety and depressed mood: Secondary | ICD-10-CM | POA: Diagnosis not present

## 2016-11-17 DIAGNOSIS — R1030 Lower abdominal pain, unspecified: Secondary | ICD-10-CM

## 2016-11-22 LAB — GASTROINTESTINAL PATHOGEN PANEL PCR
C. DIFFICILE TOX A/B, PCR: NOT DETECTED
CAMPYLOBACTER, PCR: NOT DETECTED
Cryptosporidium, PCR: NOT DETECTED
E coli (ETEC) LT/ST PCR: NOT DETECTED
E coli (STEC) stx1/stx2, PCR: NOT DETECTED
E coli 0157, PCR: NOT DETECTED
Giardia lamblia, PCR: NOT DETECTED
Norovirus, PCR: NOT DETECTED
ROTAVIRUS, PCR: NOT DETECTED
SALMONELLA, PCR: NOT DETECTED
Shigella, PCR: NOT DETECTED

## 2016-11-28 ENCOUNTER — Telehealth: Payer: Self-pay | Admitting: *Deleted

## 2016-11-28 NOTE — Telephone Encounter (Signed)
Patient's Doreene Nest has been approved from [place] from 09/10/16-09/11/17. Referral Number: [id number].

## 2016-11-30 ENCOUNTER — Telehealth: Payer: Self-pay | Admitting: Internal Medicine

## 2016-11-30 NOTE — Telephone Encounter (Signed)
Called pt, she did not have a colonoscopy, she had one of the new procedures that one does at home instead of colonoscopy, she has received some results and wants to discuss them with dr Gay Filler Dr Gay Filler will you please call pt at the # on her chart

## 2016-11-30 NOTE — Telephone Encounter (Signed)
Asking for results from colonoscopy

## 2016-12-01 ENCOUNTER — Other Ambulatory Visit: Payer: Self-pay | Admitting: *Deleted

## 2016-12-01 MED ORDER — VALSARTAN 320 MG PO TABS: 320.0000 mg | ORAL_TABLET | Freq: Every day | ORAL | 1 refills | Status: DC

## 2016-12-05 ENCOUNTER — Other Ambulatory Visit: Payer: Self-pay

## 2016-12-08 MED ORDER — PRAVASTATIN SODIUM 40 MG PO TABS: ORAL_TABLET | ORAL | 0 refills | Status: DC

## 2016-12-08 NOTE — Telephone Encounter (Signed)
She states she has spoken to the doctor and she's good

## 2016-12-08 NOTE — Telephone Encounter (Signed)
Called pt and left VM for pt asking her to call back to discuss her Cologuard results. Results were NOT conveyed in the VM.  Her test was NEGATIVE which indicates very low risk for colon cancer or pre-cancerous lesions. This is a DNA test of the stool that has a high sensitivity and good specificity for colon cancer screening.

## 2016-12-08 NOTE — Telephone Encounter (Signed)
Calling back to speak with a nurse about the test result. Please call back.

## 2016-12-08 NOTE — Telephone Encounter (Signed)
Requesting cholesterol med to be filled. Please call pt back.

## 2016-12-12 ENCOUNTER — Other Ambulatory Visit: Payer: Self-pay | Admitting: Internal Medicine

## 2016-12-12 ENCOUNTER — Other Ambulatory Visit (HOSPITAL_COMMUNITY): Payer: Self-pay | Admitting: Internal Medicine

## 2016-12-12 MED ORDER — ALPRAZOLAM 0.5 MG PO TABS: ORAL_TABLET | ORAL | 0 refills | Status: DC

## 2016-12-12 NOTE — Telephone Encounter (Signed)
I will print and leave script in the Triage office for pt to pick up.

## 2016-12-12 NOTE — Telephone Encounter (Signed)
ALPRAZolam (XANAX) 0.5 MG tablet [facility] [address]

## 2016-12-13 NOTE — Telephone Encounter (Signed)
Xanax rx called to [place] [facility].

## 2016-12-15 ENCOUNTER — Ambulatory Visit (INDEPENDENT_AMBULATORY_CARE_PROVIDER_SITE_OTHER): Payer: Medicare Other | Admitting: Psychiatry

## 2016-12-15 DIAGNOSIS — F4323 Adjustment disorder with mixed anxiety and depressed mood: Secondary | ICD-10-CM | POA: Diagnosis not present

## 2016-12-16 ENCOUNTER — Other Ambulatory Visit: Payer: Self-pay | Admitting: *Deleted

## 2016-12-16 DIAGNOSIS — I10 Essential (primary) hypertension: Secondary | ICD-10-CM

## 2016-12-17 MED ORDER — METOPROLOL TARTRATE 25 MG PO TABS: 25.0000 mg | ORAL_TABLET | Freq: Two times a day (BID) | ORAL | 0 refills | Status: DC

## 2016-12-22 LAB — COLOGUARD

## 2016-12-28 DIAGNOSIS — M533 Sacrococcygeal disorders, not elsewhere classified: Secondary | ICD-10-CM | POA: Diagnosis not present

## 2016-12-28 DIAGNOSIS — G894 Chronic pain syndrome: Secondary | ICD-10-CM | POA: Diagnosis not present

## 2016-12-28 DIAGNOSIS — M5417 Radiculopathy, lumbosacral region: Secondary | ICD-10-CM | POA: Diagnosis not present

## 2016-12-28 DIAGNOSIS — M5136 Other intervertebral disc degeneration, lumbar region: Secondary | ICD-10-CM | POA: Diagnosis not present

## 2016-12-29 ENCOUNTER — Ambulatory Visit: Payer: Self-pay | Admitting: Internal Medicine

## 2016-12-29 ENCOUNTER — Telehealth: Payer: Self-pay | Admitting: *Deleted

## 2016-12-29 NOTE — Telephone Encounter (Signed)
No show letter mailed to patient. 

## 2017-01-04 ENCOUNTER — Other Ambulatory Visit: Payer: Self-pay

## 2017-01-05 MED ORDER — BUSPIRONE HCL 5 MG PO TABS: 5.0000 mg | ORAL_TABLET | Freq: Three times a day (TID) | ORAL | 1 refills | Status: DC

## 2017-01-12 ENCOUNTER — Ambulatory Visit (INDEPENDENT_AMBULATORY_CARE_PROVIDER_SITE_OTHER): Payer: Medicare Other | Admitting: Internal Medicine

## 2017-01-12 VITALS — BP 130/74 | HR 76 | Temp 98.6°F | Wt 178.2 lb

## 2017-01-12 DIAGNOSIS — Z8261 Family history of arthritis: Secondary | ICD-10-CM | POA: Diagnosis not present

## 2017-01-12 DIAGNOSIS — Z8379 Family history of other diseases of the digestive system: Secondary | ICD-10-CM | POA: Diagnosis not present

## 2017-01-12 DIAGNOSIS — Z8371 Family history of colonic polyps: Secondary | ICD-10-CM | POA: Diagnosis not present

## 2017-01-12 DIAGNOSIS — M546 Pain in thoracic spine: Secondary | ICD-10-CM

## 2017-01-12 DIAGNOSIS — J45909 Unspecified asthma, uncomplicated: Secondary | ICD-10-CM

## 2017-01-12 DIAGNOSIS — Z87891 Personal history of nicotine dependence: Secondary | ICD-10-CM | POA: Diagnosis not present

## 2017-01-12 DIAGNOSIS — F329 Major depressive disorder, single episode, unspecified: Secondary | ICD-10-CM | POA: Diagnosis not present

## 2017-01-12 DIAGNOSIS — Z800 Family history of malignant neoplasm of digestive organs: Secondary | ICD-10-CM | POA: Diagnosis not present

## 2017-01-12 DIAGNOSIS — Z833 Family history of diabetes mellitus: Secondary | ICD-10-CM | POA: Diagnosis not present

## 2017-01-12 DIAGNOSIS — I10 Essential (primary) hypertension: Secondary | ICD-10-CM | POA: Diagnosis not present

## 2017-01-12 DIAGNOSIS — Z8249 Family history of ischemic heart disease and other diseases of the circulatory system: Secondary | ICD-10-CM

## 2017-01-12 DIAGNOSIS — Z801 Family history of malignant neoplasm of trachea, bronchus and lung: Secondary | ICD-10-CM | POA: Diagnosis not present

## 2017-01-12 DIAGNOSIS — F411 Generalized anxiety disorder: Secondary | ICD-10-CM

## 2017-01-12 DIAGNOSIS — M549 Dorsalgia, unspecified: Secondary | ICD-10-CM | POA: Insufficient documentation

## 2017-01-12 DIAGNOSIS — Z8042 Family history of malignant neoplasm of prostate: Secondary | ICD-10-CM | POA: Diagnosis not present

## 2017-01-12 DIAGNOSIS — J4530 Mild persistent asthma, uncomplicated: Secondary | ICD-10-CM

## 2017-01-12 DIAGNOSIS — Z820 Family history of epilepsy and other diseases of the nervous system: Secondary | ICD-10-CM

## 2017-01-12 MED ORDER — AZELASTINE HCL 0.1 % NA SOLN: 2.0000 | Freq: Two times a day (BID) | NASAL | 12 refills | Status: DC

## 2017-01-12 MED ORDER — ALPRAZOLAM 0.5 MG PO TABS: ORAL_TABLET | ORAL | 0 refills | Status: DC

## 2017-01-12 NOTE — Progress Notes (Signed)
Medicine attending: Medical history, presenting problems, physical findings, and medications, reviewed with resident physician Dr Holley Raring on the day of the patient visit and I concur with his evaluation and management plan.

## 2017-01-12 NOTE — Progress Notes (Signed)
CC: f/u anxiety and depression HPI: [name] is a 66 y.o. female with a h/o of breast CA, HTN, anxiety and depression, GERD, seasonal allergies, asthma who presents for routine follow-up of her anxiety depression and complained of worsening asthma and allergy symptoms.  Please see Problem-based charting for HPI and the status of patient's chronic medical conditions.  Past Medical History:  Diagnosis Date  . 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 Anal fissure   . Anxiety   . Asthma   . CAD (coronary artery disease)    Nonobstructive on cath 2003 and 2005  . Chronic back pain   . Depression   . Diverticulosis of colon (without mention of hemorrhage)   . Dog bite(E906.0)   . GERD (gastroesophageal reflux disease)   . Headache   . Hiatal hernia   . Hypertension   . Irritable bowel syndrome   . Jaundice    Hx of Jaundice at age 76 from "dirty restuarant". Unsure of Hepatitis type  . Lumbar radiculopathy    bilat LE's  . Neuropathy ([place])    bilat LE's  . Pain management   . Panic attacks   . Spinal stenosis, lumbar region, without neurogenic claudication    Social History  Substance Use Topics  . Smoking status: Former Smoker    Packs/day: 0.30    Years: 8.00    Types: Cigarettes    Quit date: 09/13/1983  . Smokeless tobacco: Never Used  . Alcohol use No   Family History  Problem Relation Age of Onset  . Hypertension Mother   . Heart disease Mother   . Dementia Mother   . Arthritis Mother   . Diabetes Mother   . Colon polyps Mother   . Prostate cancer Father     died of bony mets  . Hypertension Father   . Colon polyps Father   . Lung cancer Maternal Uncle   . Hypertension Sister   . Hypertension Brother   . Heart disease Sister   . Colon cancer Cousin   . Inflammatory bowel disease Sister   . Rectal cancer Neg Hx   . Stomach cancer Neg Hx    Review of Systems: ROS in HPI. Otherwise: Review of Systems  Constitutional:  Negative for chills, fever and weight loss.  HENT: Positive for congestion. Negative for hearing loss and sore throat.   Eyes: Positive for redness.  Respiratory: Positive for wheezing. Negative for cough and shortness of breath.   Cardiovascular: Negative for chest pain and leg swelling.  Gastrointestinal: Negative for abdominal pain, constipation, diarrhea, nausea and vomiting.  Genitourinary: Negative for dysuria, frequency and urgency.  Musculoskeletal: Positive for myalgias (Right flank intercostal muscles).  Skin: Negative for rash.  Psychiatric/Behavioral: Positive for depression. The patient is nervous/anxious.    Physical Exam: Vitals:   01/12/17 1445  BP: 130/74  Pulse: 76  Temp: 98.6 F (37 C)  TempSrc: Oral  SpO2: 96%  Weight: 178 lb 3.2 oz (80.8 kg)   Physical Exam  Constitutional: She appears well-developed. She is cooperative. No distress.  Eyes: Conjunctivae are normal. Pupils are equal, round, and reactive to light.  Neck: Normal range of motion.  Cardiovascular: Normal rate, regular rhythm, normal heart sounds and normal pulses.  Exam reveals no gallop.   No murmur heard. Pulmonary/Chest: Effort normal and breath sounds normal. No respiratory distress. She has no wheezes. She has no rhonchi. She has no rales. Breasts are symmetrical.  Abdominal: Soft. Bowel sounds are normal. There is no tenderness.  Musculoskeletal: She exhibits no edema.  TTP over the intercostal muscles of the left flank, pain with twisting of torso and ROM of RUE.    Assessment & Plan:  See encounters tab for problem based medical decision making. Patient discussed with Dr. Beryle Beams  Asthma Patient reports that she has recently had flareups of her asthma with some wheezing and difficulty breathing in the setting of worsening allergies. Patient was seen by allergist in January who recommended continuation of her rehabilitative inhaler, albuterol, prescribed Zaditor for itchy eyes, and  recommended fluticasone nose spray. Patient was encouraged to continue all of these therapies to control her asthma and her seasonal allergies which may be exacerbating. I've also encouraged patient to call our clinic should her wheezing or shortness of breath worsened and she would require her albuterol inhaler more than once daily.  Assessment: Well controlled asthma with recent exacerbation secondary to seasonal allergies  Plan: Continue current therapy, return to clinic for further evaluation if increased reliance on albuterol, add azelastine nasal spray for allergy control and reduction in asthma trigger  Major depression, chronic Patient is a long history of depression and has recent exacerbation in her psychosocial situation regarding the health of her mother. Recently is her mother's health has declined patient has found herself increasingly more sad in addition to having more stress related to care of her mother. Patient does endorse depression and feeling low. She was recently started on BuSpar in January and has found this to be somewhat helpful. She has been taking 5 mg twice daily. Believe she will benefit from increasing this to a full 3 times a day dosing. Patient is also taking Xanax for anxiety and has been doing well after tapering from 3 times a day to twice a day.  Assessment: Major depression, chronic, recently exacerbated by life situation  Plan: Increase BuSpar to 5 mg 3 times a day, readdress at future visit and consider increasing dose or adding second agent. Would also recommend obtaining. She denied a follow-up visit to establish baseline and continue to monitor depression symptoms.  Generalized anxiety disorder Patient is long history of generalized excited disorder for which she was pretty treated with 3 times a day Xanax 0.5 mg. She was recently tapered of January to twice a day with the addition of BuSpar. She's been tolerating this change well but is resistant to  further changes in the setting of recent stressors with her mother's health. Patient's BuSpar was increased to 5 mg 3 times a day from the twice a day she had been taking. Again encouraged patient to continue to think about tapering her Xanax use to once daily with the supplemental use of BuSpar which can be titrated to help control her symptoms, on a more long-term basis. We will plan to readdress this with the patient at her next visit.  Assessment: Well controlled generalized anxiety disorder with chronic but that has been use  Plan: Continue Xanax 0.5 mg twice a day Increase BuSpar to 5 mg 3 times a day, titrate to symptoms and consider adding second agent.  Essential hypertension Patient's blood pressure was well-controlled today. She is compliant with her current regimen. She denies any headaches, chest pain, shortness of breath, changes to vision.  Assessment: Well-controlled hypertension  Plan: Continue current therapy  Right-sided back pain Patient reports acute onset of right-sided thoracic back pain in the setting of having strained her muscles moving some heavy objects  in the last 3 or 4 days. Patient reports that this is worse with turning of her torso and moving of her right upper extremity. She is tender to palpation of the intercostal muscles of her thoracic back on the right side. She was concerned of some intrapulmonary process. Her lung exam is negative and have reassured her that this is musculoskeletal in nature. I recommended she take ibuprofen as needed for pain.  Assessment: Musculoskeletal pain from intercostal strain  Plan: Ibuprofen 400 mg every 4 hours for several days as needed   Signed: Holley Raring, MD 01/12/2017, 4:56 PM  Pager: [phone]

## 2017-01-12 NOTE — Assessment & Plan Note (Signed)
Patient is a long history of depression and has recent exacerbation in her psychosocial situation regarding the health of her mother. Recently is her mother's health has declined patient has found herself increasingly more sad in addition to having more stress related to care of her mother. Patient does endorse depression and feeling low. She was recently started on BuSpar in January and has found this to be somewhat helpful. She has been taking 5 mg twice daily. Believe she will benefit from increasing this to a full 3 times a day dosing. Patient is also taking Xanax for anxiety and has been doing well after tapering from 3 times a day to twice a day.  Assessment: Major depression, chronic, recently exacerbated by life situation  Plan: Increase BuSpar to 5 mg 3 times a day, readdress at future visit and consider increasing dose or adding second agent. Would also recommend obtaining. She denied a follow-up visit to establish baseline and continue to monitor depression symptoms.

## 2017-01-12 NOTE — Assessment & Plan Note (Signed)
Patient reports acute onset of right-sided thoracic back pain in the setting of having strained her muscles moving some heavy objects in the last 3 or 4 days. Patient reports that this is worse with turning of her torso and moving of her right upper extremity. She is tender to palpation of the intercostal muscles of her thoracic back on the right side. She was concerned of some intrapulmonary process. Her lung exam is negative and have reassured her that this is musculoskeletal in nature. I recommended she take ibuprofen as needed for pain.  Assessment: Musculoskeletal pain from intercostal strain  Plan: Ibuprofen 400 mg every 4 hours for several days as needed

## 2017-01-12 NOTE — Assessment & Plan Note (Signed)
Patient is long history of generalized excited disorder for which she was pretty treated with 3 times a day Xanax 0.5 mg. She was recently tapered of January to twice a day with the addition of BuSpar. She's been tolerating this change well but is resistant to further changes in the setting of recent stressors with her mother's health. Patient's BuSpar was increased to 5 mg 3 times a day from the twice a day she had been taking. Again encouraged patient to continue to think about tapering her Xanax use to once daily with the supplemental use of BuSpar which can be titrated to help control her symptoms, on a more long-term basis. We will plan to readdress this with the patient at her next visit.  Assessment: Well controlled generalized anxiety disorder with chronic but that has been use  Plan: Continue Xanax 0.5 mg twice a day Increase BuSpar to 5 mg 3 times a day, titrate to symptoms and consider adding second agent.

## 2017-01-12 NOTE — Patient Instructions (Signed)
You can use the new nasal spray 2 times a day to help with your allergies. If you breathing gets worse and you need to use your albuterol inhaler more than once a day, please call the office for another appointment as we may need to treat your asthma with steroids.  I will increase you BuSpar medication to 3 times daily to help with anxiety and depression. Continue to take the Xanax 2 times every day.

## 2017-01-12 NOTE — Assessment & Plan Note (Signed)
Patient reports that she has recently had flareups of her asthma with some wheezing and difficulty breathing in the setting of worsening allergies. Patient was seen by allergist in January who recommended continuation of her rehabilitative inhaler, albuterol, prescribed Zaditor for itchy eyes, and recommended fluticasone nose spray. Patient was encouraged to continue all of these therapies to control her asthma and her seasonal allergies which may be exacerbating. I've also encouraged patient to call our clinic should her wheezing or shortness of breath worsened and she would require her albuterol inhaler more than once daily.  Assessment: Well controlled asthma with recent exacerbation secondary to seasonal allergies  Plan: Continue current therapy, return to clinic for further evaluation if increased reliance on albuterol, add azelastine nasal spray for allergy control and reduction in asthma trigger

## 2017-01-12 NOTE — Assessment & Plan Note (Signed)
Patient's blood pressure was well-controlled today. She is compliant with her current regimen. She denies any headaches, chest pain, shortness of breath, changes to vision.  Assessment: Well-controlled hypertension  Plan: Continue current therapy

## 2017-01-13 ENCOUNTER — Other Ambulatory Visit: Payer: Self-pay

## 2017-01-17 ENCOUNTER — Ambulatory Visit: Payer: Medicare Other | Admitting: Psychiatry

## 2017-01-20 DIAGNOSIS — M533 Sacrococcygeal disorders, not elsewhere classified: Secondary | ICD-10-CM | POA: Diagnosis not present

## 2017-01-20 DIAGNOSIS — G894 Chronic pain syndrome: Secondary | ICD-10-CM | POA: Diagnosis not present

## 2017-01-20 DIAGNOSIS — M5417 Radiculopathy, lumbosacral region: Secondary | ICD-10-CM | POA: Diagnosis not present

## 2017-01-20 DIAGNOSIS — M5136 Other intervertebral disc degeneration, lumbar region: Secondary | ICD-10-CM | POA: Diagnosis not present

## 2017-01-24 DIAGNOSIS — M5417 Radiculopathy, lumbosacral region: Secondary | ICD-10-CM | POA: Diagnosis not present

## 2017-01-24 DIAGNOSIS — Z880 Allergy status to penicillin: Secondary | ICD-10-CM | POA: Diagnosis not present

## 2017-01-24 DIAGNOSIS — G894 Chronic pain syndrome: Secondary | ICD-10-CM | POA: Diagnosis not present

## 2017-01-24 DIAGNOSIS — Z885 Allergy status to narcotic agent status: Secondary | ICD-10-CM | POA: Diagnosis not present

## 2017-01-24 DIAGNOSIS — Z7982 Long term (current) use of aspirin: Secondary | ICD-10-CM | POA: Diagnosis not present

## 2017-01-24 DIAGNOSIS — Z888 Allergy status to other drugs, medicaments and biological substances status: Secondary | ICD-10-CM | POA: Diagnosis not present

## 2017-01-24 DIAGNOSIS — Z882 Allergy status to sulfonamides status: Secondary | ICD-10-CM | POA: Diagnosis not present

## 2017-01-24 DIAGNOSIS — Z853 Personal history of malignant neoplasm of breast: Secondary | ICD-10-CM | POA: Diagnosis not present

## 2017-01-24 DIAGNOSIS — I252 Old myocardial infarction: Secondary | ICD-10-CM | POA: Diagnosis not present

## 2017-01-24 DIAGNOSIS — Z91048 Other nonmedicinal substance allergy status: Secondary | ICD-10-CM | POA: Diagnosis not present

## 2017-01-24 DIAGNOSIS — Z87891 Personal history of nicotine dependence: Secondary | ICD-10-CM | POA: Diagnosis not present

## 2017-01-24 DIAGNOSIS — I10 Essential (primary) hypertension: Secondary | ICD-10-CM | POA: Diagnosis not present

## 2017-01-24 DIAGNOSIS — M533 Sacrococcygeal disorders, not elsewhere classified: Secondary | ICD-10-CM | POA: Diagnosis not present

## 2017-01-24 DIAGNOSIS — Z79899 Other long term (current) drug therapy: Secondary | ICD-10-CM | POA: Diagnosis not present

## 2017-01-30 ENCOUNTER — Other Ambulatory Visit: Payer: Self-pay

## 2017-02-02 MED ORDER — PRAVASTATIN SODIUM 40 MG PO TABS: ORAL_TABLET | ORAL | 0 refills | Status: DC

## 2017-02-09 ENCOUNTER — Other Ambulatory Visit: Payer: Self-pay | Admitting: Internal Medicine

## 2017-02-09 ENCOUNTER — Other Ambulatory Visit: Payer: Self-pay

## 2017-02-09 DIAGNOSIS — I10 Essential (primary) hypertension: Secondary | ICD-10-CM

## 2017-02-09 MED ORDER — PANTOPRAZOLE SODIUM 40 MG PO TBEC
40.0000 mg | DELAYED_RELEASE_TABLET | Freq: Every day | ORAL | 0 refills | Status: DC
Start: 2017-02-09 — End: 2017-02-16

## 2017-02-09 MED ORDER — ALPRAZOLAM 0.5 MG PO TABS: ORAL_TABLET | ORAL | 0 refills | Status: DC

## 2017-02-09 MED ORDER — VALSARTAN 320 MG PO TABS: 320.0000 mg | ORAL_TABLET | Freq: Every day | ORAL | 0 refills | Status: DC

## 2017-02-09 NOTE — Telephone Encounter (Signed)
This has been called to pharm- [facility] [address]

## 2017-02-09 NOTE — Telephone Encounter (Signed)
NEEDS REFILL ON STOMACH MED, B/P, XANAX TO [facility] [address]

## 2017-02-09 NOTE — Telephone Encounter (Signed)
Called pt to clarify refill request - no answer.

## 2017-02-09 NOTE — Telephone Encounter (Signed)
ALPRAZolam (XANAX) 0.5 MG tablet, is not at the pharmacy per patient.

## 2017-02-10 NOTE — Telephone Encounter (Signed)
Xanax rx called to [facility].

## 2017-02-14 DIAGNOSIS — Z91048 Other nonmedicinal substance allergy status: Secondary | ICD-10-CM | POA: Diagnosis not present

## 2017-02-14 DIAGNOSIS — G894 Chronic pain syndrome: Secondary | ICD-10-CM | POA: Diagnosis not present

## 2017-02-14 DIAGNOSIS — Z91040 Latex allergy status: Secondary | ICD-10-CM | POA: Diagnosis not present

## 2017-02-14 DIAGNOSIS — Z881 Allergy status to other antibiotic agents status: Secondary | ICD-10-CM | POA: Diagnosis not present

## 2017-02-14 DIAGNOSIS — Z853 Personal history of malignant neoplasm of breast: Secondary | ICD-10-CM | POA: Diagnosis not present

## 2017-02-14 DIAGNOSIS — Z880 Allergy status to penicillin: Secondary | ICD-10-CM | POA: Diagnosis not present

## 2017-02-14 DIAGNOSIS — M5417 Radiculopathy, lumbosacral region: Secondary | ICD-10-CM | POA: Diagnosis not present

## 2017-02-14 DIAGNOSIS — Z90710 Acquired absence of both cervix and uterus: Secondary | ICD-10-CM | POA: Diagnosis not present

## 2017-02-14 DIAGNOSIS — Z87891 Personal history of nicotine dependence: Secondary | ICD-10-CM | POA: Diagnosis not present

## 2017-02-14 DIAGNOSIS — I252 Old myocardial infarction: Secondary | ICD-10-CM | POA: Diagnosis not present

## 2017-02-14 DIAGNOSIS — Z888 Allergy status to other drugs, medicaments and biological substances status: Secondary | ICD-10-CM | POA: Diagnosis not present

## 2017-02-14 DIAGNOSIS — I10 Essential (primary) hypertension: Secondary | ICD-10-CM | POA: Diagnosis not present

## 2017-02-14 DIAGNOSIS — M533 Sacrococcygeal disorders, not elsewhere classified: Secondary | ICD-10-CM | POA: Diagnosis not present

## 2017-02-14 DIAGNOSIS — Z882 Allergy status to sulfonamides status: Secondary | ICD-10-CM | POA: Diagnosis not present

## 2017-02-14 DIAGNOSIS — Z9049 Acquired absence of other specified parts of digestive tract: Secondary | ICD-10-CM | POA: Diagnosis not present

## 2017-02-14 DIAGNOSIS — M461 Sacroiliitis, not elsewhere classified: Secondary | ICD-10-CM | POA: Diagnosis not present

## 2017-02-16 ENCOUNTER — Ambulatory Visit (INDEPENDENT_AMBULATORY_CARE_PROVIDER_SITE_OTHER): Payer: Medicare Other | Admitting: Internal Medicine

## 2017-02-16 ENCOUNTER — Encounter: Payer: Self-pay | Admitting: Internal Medicine

## 2017-02-16 VITALS — BP 110/78 | HR 68 | Ht 63.0 in | Wt 179.0 lb

## 2017-02-16 DIAGNOSIS — I2510 Atherosclerotic heart disease of native coronary artery without angina pectoris: Secondary | ICD-10-CM

## 2017-02-16 DIAGNOSIS — K30 Functional dyspepsia: Secondary | ICD-10-CM | POA: Diagnosis not present

## 2017-02-16 DIAGNOSIS — K589 Irritable bowel syndrome without diarrhea: Secondary | ICD-10-CM

## 2017-02-16 DIAGNOSIS — K219 Gastro-esophageal reflux disease without esophagitis: Secondary | ICD-10-CM

## 2017-02-16 DIAGNOSIS — K5900 Constipation, unspecified: Secondary | ICD-10-CM

## 2017-02-16 MED ORDER — PANTOPRAZOLE SODIUM 40 MG PO TBEC: 40.0000 mg | DELAYED_RELEASE_TABLET | Freq: Two times a day (BID) | ORAL | 0 refills | Status: DC

## 2017-02-16 NOTE — Patient Instructions (Addendum)
We have sent the following medications to your pharmacy for you to pick up at your convenience: Pantoprazole 40 mg twice daily before meals  Please purchase the following medications over the counter and take as directed: Benefiber 1 tablespoon daily  Please follow up with Dr Hilarie Fredrickson in 3-4 months.  If you are age 64 or older, your body mass index should be between 23-30. Your Body mass index is 31.71 kg/m. If this is out of the aforementioned range listed, please consider follow up with your Primary Care Provider.  If you are age 57 or younger, your body mass index should be between 19-25. Your Body mass index is 31.71 kg/m. If this is out of the aformentioned range listed, please consider follow up with your Primary Care Provider.

## 2017-02-16 NOTE — Progress Notes (Signed)
Subjective:    Patient ID: [name], female    DOB: 12/04/1950, 66 y.o.   MRN: [id number]  HPI [name] is a 66 year old female with a history of IBS, history of constipation with abdominal bloating, history of gastritis and Candida esophagitis with dysphagia is here for follow-up. She was last seen in March 2018. She is here alone today. At her last visit she was having right lower quadrant abdominal pain as well as loose stools. Infectious stool study was negative. She was treated with rifaximin 550 mg 3 times a day 14 days.  She reports this medication helped significantly with her loose stools and right-sided abdominal pain. Both have resolved. She is now at times feeling slightly constipated as she has had before. She denies abdominal pain. She does still have postprandial abdominal bloating. She has had increase in indigestion recently despite pantoprazole 40 mg once daily. We had previously mentioned ranitidine but she has not used this. She reports role vegetables and green leafy vegetables tend to make her indigestion worse. She denies dysphagia and odynophagia.  Of note her primary care did order a Cologuard in February 2018 which was negative. Her last colonoscopy was performed in 2013 by Dr. Sharlett Iles and showed moderate diverticulosis in the left colon but was otherwise normal. Ten-year recall was recommended  Review of Systems As per history of present illness, otherwise negative  Current Medications, Allergies, Past Medical History, Past Surgical History, Family History and Social History were reviewed in [employer] record.     Objective:   Physical Exam BP 110/78   Pulse 68   Ht 5\' 3"  (1.6 m)   Wt 179 lb (81.2 kg)   BMI 31.71 kg/m  Constitutional: Well-developed and well-nourished. No distress. HEENT: Normocephalic and atraumatic. Oropharynx is clear and moist. Conjunctivae are normal.  No scleral icterus. Neck: Neck supple. Trachea  midline. Cardiovascular: Normal rate, regular rhythm and intact distal pulses. No M/R/G Pulmonary/chest: Effort normal and breath sounds normal. No wheezing, rales or rhonchi. Abdominal: Soft, Diffusely tender to deep palpation without rebound or guarding, nondistended. Bowel sounds active throughout.  Extremities: no clubbing, cyanosis, or edema Neurological: Alert and oriented to person place and time. Skin: Skin is warm and dry. Psychiatric: Normal mood and affect. Behavior is normal.  CBC    Component Value Date/Time   WBC 4.7 05/03/2016 1040   RBC 4.91 05/03/2016 1040   HGB 15.2 (H) 05/03/2016 1040   HGB 12.9 06/11/2015 1057   HCT 45.9 05/03/2016 1040   HCT 38.5 06/11/2015 1057   PLT 217 05/03/2016 1040   PLT 184 06/11/2015 1057   MCV 93.5 05/03/2016 1040   MCV 89.8 06/11/2015 1057   MCH 31.0 05/03/2016 1040   MCHC 33.1 05/03/2016 1040   RDW 13.3 05/03/2016 1040   RDW 13.7 06/11/2015 1057   LYMPHSABS 1.2 06/11/2015 1057   MONOABS 0.3 06/11/2015 1057   EOSABS 0.1 06/11/2015 1057   BASOSABS 0.0 06/11/2015 1057   CMP     Component Value Date/Time   NA 142 11/03/2016 1625   NA 142 06/11/2015 1057   K 3.9 11/03/2016 1625   K 3.7 06/11/2015 1057   CL 99 11/03/2016 1625   CL 104 12/07/2012 1341   CO2 29 11/03/2016 1625   CO2 29 06/11/2015 1057   GLUCOSE 112 (H) 11/03/2016 1625   GLUCOSE 123 (H) 05/05/2016 0302   GLUCOSE 113 06/11/2015 1057   GLUCOSE 110 (H) 12/07/2012 1341   BUN 17  11/03/2016 1625   BUN 15.4 06/11/2015 1057   CREATININE 0.82 11/03/2016 1625   CREATININE 0.8 06/11/2015 1057   CALCIUM 10.4 (H) 11/03/2016 1625   CALCIUM 9.8 06/11/2015 1057   PROT 7.5 05/03/2016 1040   PROT 6.5 06/11/2015 1057   ALBUMIN 4.7 05/03/2016 1040   ALBUMIN 3.9 06/11/2015 1057   AST 28 05/03/2016 1040   AST 24 06/11/2015 1057   ALT 21 05/03/2016 1040   ALT 25 06/11/2015 1057   ALKPHOS 59 05/03/2016 1040   ALKPHOS 55 06/11/2015 1057   BILITOT 1.0 05/03/2016 1040    BILITOT 0.59 06/11/2015 1057   GFRNONAA 76 11/03/2016 1625   GFRNONAA 63 12/23/2014 1035   GFRAA 88 11/03/2016 1625   GFRAA 73 12/23/2014 1035       Assessment & Plan:  66 year old female with a history of IBS, history of constipation with abdominal bloating, history of gastritis and Candida esophagitis with dysphagia is here for follow-up  1. GERD/indigestion/history of gastritis -- history of H. pylori negative gastritis. I'm going to increase pantoprazole to 40 mg twice a day before meals to see if the symptoms come under better control. We reviewed GERD diet encouraged triggers. If symptoms fail to improve upper endoscopy is recommended. She understands this recommendation and will let me know if symptoms fail to improve. Avoid NSAIDs.  2. IBS with history of constipation -- resolution of right-sided abdominal pain and loose stools with rifaximin. This was a good response. Back to baseline IBS with constipation. Begin Benefiber 1 working to 2 tablespoons daily to help bulk the stool and increased regularity. Will avoid MiraLAX for now.  3. Colorectal cancer screening -- repeat colonoscopy recommended in 2023, Cologuard ordered by primary care recently negative  6 month follow-up, sooner if necessary 25 minutes spent with the patient today. Greater than 50% was spent in counseling and coordination of care with the patient

## 2017-02-17 ENCOUNTER — Encounter: Payer: Self-pay | Admitting: *Deleted

## 2017-02-25 ENCOUNTER — Other Ambulatory Visit: Payer: Self-pay | Admitting: Internal Medicine

## 2017-02-28 NOTE — Telephone Encounter (Signed)
  chlorthalidone (HYGROTON) 50 MG tablet, refill request

## 2017-03-01 ENCOUNTER — Other Ambulatory Visit: Payer: Self-pay

## 2017-03-01 MED ORDER — CHLORTHALIDONE 50 MG PO TABS: 50.0000 mg | ORAL_TABLET | Freq: Every day | ORAL | 3 refills | Status: DC

## 2017-03-13 ENCOUNTER — Other Ambulatory Visit: Payer: Self-pay | Admitting: Internal Medicine

## 2017-03-13 MED ORDER — ALPRAZOLAM 0.5 MG PO TABS: ORAL_TABLET | ORAL | 0 refills | Status: DC

## 2017-03-13 NOTE — Telephone Encounter (Signed)
XANAX 0.5MG  AT [place], [place]

## 2017-03-13 NOTE — Telephone Encounter (Signed)
Called to pharm 

## 2017-03-13 NOTE — Telephone Encounter (Signed)
Refilled 1 month, should be following up with PCP in 1 month

## 2017-03-14 ENCOUNTER — Other Ambulatory Visit: Payer: Self-pay

## 2017-03-14 MED ORDER — VALSARTAN 320 MG PO TABS: 320.0000 mg | ORAL_TABLET | Freq: Every day | ORAL | 3 refills | Status: DC

## 2017-03-14 NOTE — Telephone Encounter (Signed)
valsartan (DIOVAN) 320 MG tablet, refill request @ [place].

## 2017-03-22 DIAGNOSIS — M5136 Other intervertebral disc degeneration, lumbar region: Secondary | ICD-10-CM | POA: Diagnosis not present

## 2017-03-22 DIAGNOSIS — G894 Chronic pain syndrome: Secondary | ICD-10-CM | POA: Diagnosis not present

## 2017-03-22 DIAGNOSIS — M533 Sacrococcygeal disorders, not elsewhere classified: Secondary | ICD-10-CM | POA: Diagnosis not present

## 2017-03-22 DIAGNOSIS — M5417 Radiculopathy, lumbosacral region: Secondary | ICD-10-CM | POA: Diagnosis not present

## 2017-03-23 ENCOUNTER — Ambulatory Visit (INDEPENDENT_AMBULATORY_CARE_PROVIDER_SITE_OTHER): Payer: Medicare Other | Admitting: Internal Medicine

## 2017-03-23 ENCOUNTER — Encounter: Payer: Self-pay | Admitting: Internal Medicine

## 2017-03-23 VITALS — BP 131/80 | HR 78 | Temp 98.3°F | Ht 63.0 in | Wt 179.3 lb

## 2017-03-23 DIAGNOSIS — Z7982 Long term (current) use of aspirin: Secondary | ICD-10-CM

## 2017-03-23 DIAGNOSIS — K219 Gastro-esophageal reflux disease without esophagitis: Secondary | ICD-10-CM

## 2017-03-23 DIAGNOSIS — Z79899 Other long term (current) drug therapy: Secondary | ICD-10-CM | POA: Diagnosis not present

## 2017-03-23 DIAGNOSIS — Z87891 Personal history of nicotine dependence: Secondary | ICD-10-CM

## 2017-03-23 DIAGNOSIS — F411 Generalized anxiety disorder: Secondary | ICD-10-CM | POA: Diagnosis not present

## 2017-03-23 DIAGNOSIS — I10 Essential (primary) hypertension: Secondary | ICD-10-CM | POA: Diagnosis not present

## 2017-03-23 MED ORDER — BUSPIRONE HCL 5 MG PO TABS: 10.0000 mg | ORAL_TABLET | Freq: Three times a day (TID) | ORAL | 1 refills | Status: DC

## 2017-03-23 NOTE — Assessment & Plan Note (Signed)
Well controlled. Currently taking Metoprolol tartrate 25 mg BID and Chlorthalidone 50 mg QD. Encouraged healthy eating habits and cardiovascular exercises of at least 30 minutes per day. Encourage to check BP at home at least once weekly.

## 2017-03-23 NOTE — Assessment & Plan Note (Signed)
GAD scale score of 12. Continue Alprazolam 05 mg BID. Increase Buspar to 10 mg TID. Will follow-up in 4 weeks with repeat GAD scale. UDS today. Database checked to ensure appropriate use of Alprazolam.

## 2017-03-23 NOTE — Assessment & Plan Note (Signed)
Poorly controlled. Currently seeing Dr. Raquel James. On Pantoprazole 40 mg BID. Informed that weight loss can help. Will follow-up with Dr. Raquel James on 05/24/2017

## 2017-03-23 NOTE — Progress Notes (Signed)
   CC: Here for follow-up on her anxiety.  HPI:  [name] is a 66 y.o. presenting today for follow-up on her generalized anxiety. She states that she has been under a lot of stress recently including the 1 year anniversary of her fiances death, her mother being sick, and poor working environments. She has been taking her Alprazolam 0.5 mg twice daily and her Buspar 5 mg three times daily. The Alprazolam helps tremendously but she does not notice a big difference with the Buspar. She denies any episodes of palpitations, sweating, chest pain, or other symptoms consistent with anxiety/panic attacks.    Today we also discussed her hypertension. She is currently taking metoprolol and chlorthalidone. She does not miss any doses. She only measures her BP once or twice per month. Her diet is inconsistent and she does not do any cardiovascular exercising on a regular basis. She denies any persistent HA, visual changes, N/V, chest pain, or abdominal pain.   The final topic discussed was her GERD. She has noticed a productive morning cough of increasing frequency. Along with a metallic taste and horse voice in the AM. She is currently taking Pantoprazole 40 mg BID per Dr. Venita Sheffield recommendation. This does not appear to have provided relief. She denies the use of tobacco or alcohol.   Past Medical History:  Diagnosis Date  . 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 Anal fissure   . Anxiety   . Asthma   . CAD (coronary artery disease)    Nonobstructive on cath 2003 and 2005  . Chronic back pain   . Depression   . Diverticulosis of colon (without mention of hemorrhage)   . Dog bite(E906.0)   . GERD (gastroesophageal reflux disease)   . Headache   . Hiatal hernia   . Hypertension   . Irritable bowel syndrome   . Jaundice    Hx of Jaundice at age 39 from "dirty restuarant". Unsure of Hepatitis type  . Lumbar radiculopathy    bilat LE's  . Neuropathy    bilat LE's  .  Pain management   . Panic attacks   . Spinal stenosis, lumbar region, without neurogenic claudication    Review of Systems:   Review of Systems  Constitutional: Negative for chills and fever.  Cardiovascular: Negative for chest pain and palpitations.   Physical Exam:  Vitals:   03/23/17 1342 03/23/17 1415  BP: (!) 150/75 131/80  Pulse: 81 78  Temp: 98.3 F (36.8 C)   TempSrc: Oral   SpO2: 99%   Weight: 179 lb 4.8 oz (81.3 kg)   Height: 5\' 3"  (1.6 m)    General: Alert, oriented x 3, no apparent distress HENT: TM clear with good light reflex, no erythema of the external canal  Cardio: Regular rate and rhythm, no murmurs or rubs appreciated Pulm: Clear breath sounds bilaterally with no apparent increased work of breathing, no crackles or rales.  GI: Active bowel sounds, no tenderness to palpation  Extremities: No edema, peripheral pulses intact in all extremities   Assessment & Plan:   See Encounters Tab for problem based charting.  Patient seen with Dr. Evette Doffing

## 2017-03-23 NOTE — Patient Instructions (Signed)
It was great meeting you Payal.   Today we made the following changes:  - Increased your Buspar to 10 mg three times a day   - Will see you in 4 weeks   Generalized Anxiety Disorder, Adult Generalized anxiety disorder (GAD) is a mental health disorder. People with this condition constantly worry about everyday events. Unlike normal anxiety, worry related to GAD is not triggered by a specific event. These worries also do not fade or get better with time. GAD interferes with life functions, including relationships, work, and school. GAD can vary from mild to severe. People with severe GAD can have intense waves of anxiety with physical symptoms (panic attacks). What are the causes? The exact cause of GAD is not known. What increases the risk? This condition is more likely to develop in:  Women.  People who have a family history of anxiety disorders.  People who are very shy.  People who experience very stressful life events, such as the death of a loved one.  People who have a very stressful family environment.  What are the signs or symptoms? People with GAD often worry excessively about many things in their lives, such as their health and family. They may also be overly concerned about:  Doing well at work.  Being on time.  Natural disasters.  Friendships.  Physical symptoms of GAD include:  Fatigue.  Muscle tension or having muscle twitches.  Trembling or feeling shaky.  Being easily startled.  Feeling like your heart is pounding or racing.  Feeling out of breath or like you cannot take a deep breath.  Having trouble falling asleep or staying asleep.  Sweating.  Nausea, diarrhea, or irritable bowel syndrome (IBS).  Headaches.  Trouble concentrating or remembering facts.  Restlessness.  Irritability.  How is this diagnosed? Your health care provider can diagnose GAD based on your symptoms and medical history. You will also have a physical exam. The  health care provider will ask specific questions about your symptoms, including how severe they are, when they started, and if they come and go. Your health care provider may ask you about your use of alcohol or drugs, including prescription medicines. Your health care provider may refer you to a mental health specialist for further evaluation. Your health care provider will do a thorough examination and may perform additional tests to rule out other possible causes of your symptoms. To be diagnosed with GAD, a person must have anxiety that:  Is out of his or her control.  Affects several different aspects of his or her life, such as work and relationships.  Causes distress that makes him or her unable to take part in normal activities.  Includes at least three physical symptoms of GAD, such as restlessness, fatigue, trouble concentrating, irritability, muscle tension, or sleep problems.  Before your health care provider can confirm a diagnosis of GAD, these symptoms must be present more days than they are not, and they must last for six months or longer. How is this treated? The following therapies are usually used to treat GAD:  Medicine. Antidepressant medicine is usually prescribed for long-term daily control. Antianxiety medicines may be added in severe cases, especially when panic attacks occur.  Talk therapy (psychotherapy). Certain types of talk therapy can be helpful in treating GAD by providing support, education, and guidance. Options include: ? Cognitive behavioral therapy (CBT). People learn coping skills and techniques to ease their anxiety. They learn to identify unrealistic or negative thoughts and behaviors and  to replace them with positive ones. ? Acceptance and commitment therapy (ACT). This treatment teaches people how to be mindful as a way to cope with unwanted thoughts and feelings. ? Biofeedback. This process trains you to manage your body's response (physiological  response) through breathing techniques and relaxation methods. You will work with a therapist while machines are used to monitor your physical symptoms.  Stress management techniques. These include yoga, meditation, and exercise.  A mental health specialist can help determine which treatment is best for you. Some people see improvement with one type of therapy. However, other people require a combination of therapies. Follow these instructions at home:  Take over-the-counter and prescription medicines only as told by your health care provider.  Try to maintain a normal routine.  Try to anticipate stressful situations and allow extra time to manage them.  Practice any stress management or self-calming techniques as taught by your health care provider.  Do not punish yourself for setbacks or for not making progress.  Try to recognize your accomplishments, even if they are small.  Keep all follow-up visits as told by your health care provider. This is important. Contact a health care provider if:  Your symptoms do not get better.  Your symptoms get worse.  You have signs of depression, such as: ? A persistently sad, cranky, or irritable mood. ? Loss of enjoyment in activities that used to bring you joy. ? Change in weight or eating. ? Changes in sleeping habits. ? Avoiding friends or family members. ? Loss of energy for normal tasks. ? Feelings of guilt or worthlessness. Get help right away if:  You have serious thoughts about hurting yourself or others. If you ever feel like you may hurt yourself or others, or have thoughts about taking your own life, get help right away. You can go to your nearest emergency department or call:  Your local emergency services (911 in the [place]  A suicide crisis helpline, such as the [place] at 2673827014. This is open 24 hours a day.  Summary  Generalized anxiety disorder (GAD) is a mental health disorder  that involves worry that is not triggered by a specific event.  People with GAD often worry excessively about many things in their lives, such as their health and family.  GAD may cause physical symptoms such as restlessness, trouble concentrating, sleep problems, frequent sweating, nausea, diarrhea, headaches, and trembling or muscle twitching.  A mental health specialist can help determine which treatment is best for you. Some people see improvement with one type of therapy. However, other people require a combination of therapies. This information is not intended to replace advice given to you by your health care provider. Make sure you discuss any questions you have with your health care provider. Document Released: 12/24/2012 Document Revised: 07/19/2016 Document Reviewed: 07/19/2016 Elsevier Interactive Patient Education  [employer].

## 2017-03-24 ENCOUNTER — Telehealth: Payer: Self-pay | Admitting: Internal Medicine

## 2017-03-24 DIAGNOSIS — R062 Wheezing: Secondary | ICD-10-CM | POA: Diagnosis not present

## 2017-03-24 DIAGNOSIS — K219 Gastro-esophageal reflux disease without esophagitis: Secondary | ICD-10-CM | POA: Diagnosis not present

## 2017-03-24 DIAGNOSIS — H1045 Other chronic allergic conjunctivitis: Secondary | ICD-10-CM | POA: Diagnosis not present

## 2017-03-24 DIAGNOSIS — R05 Cough: Secondary | ICD-10-CM | POA: Diagnosis not present

## 2017-03-24 NOTE — Telephone Encounter (Signed)
Pt scheduled to see Tye Savoy NP Monday 03/27/17@2 :30pm. Pt aware of appt.

## 2017-03-24 NOTE — Progress Notes (Signed)
Internal Medicine Clinic Attending  I saw and evaluated the patient.  I personally confirmed the key portions of the history and exam documented by Dr. Helberg and I reviewed pertinent patient test results.  The assessment, diagnosis, and plan were formulated together and I agree with the documentation in the resident's note. 

## 2017-03-27 ENCOUNTER — Ambulatory Visit: Payer: Self-pay | Admitting: Nurse Practitioner

## 2017-04-01 LAB — TOXASSURE SELECT,+ANTIDEPR,UR

## 2017-04-05 ENCOUNTER — Other Ambulatory Visit: Payer: Self-pay | Admitting: *Deleted

## 2017-04-05 DIAGNOSIS — I10 Essential (primary) hypertension: Secondary | ICD-10-CM

## 2017-04-05 MED ORDER — METOPROLOL TARTRATE 25 MG PO TABS: 25.0000 mg | ORAL_TABLET | Freq: Two times a day (BID) | ORAL | 3 refills | Status: DC

## 2017-04-10 ENCOUNTER — Telehealth: Payer: Self-pay | Admitting: Internal Medicine

## 2017-04-10 DIAGNOSIS — I10 Essential (primary) hypertension: Secondary | ICD-10-CM

## 2017-04-10 MED ORDER — OLMESARTAN MEDOXOMIL 40 MG PO TABS: 40.0000 mg | ORAL_TABLET | Freq: Every day | ORAL | 3 refills | Status: DC

## 2017-04-10 NOTE — Telephone Encounter (Signed)
Patient states she rec'd a letter from [facility] about her Valsartan being recalled and would like to know what to take.

## 2017-04-10 NOTE — Telephone Encounter (Signed)
Patient has taken Valsartan this morning, she also takes metoprolol & Chlorthalidone.  Her latest BP she took last week 150/82, advised her we'll call her back for further instructions.

## 2017-04-10 NOTE — Telephone Encounter (Signed)
Informed patient of new med- Olmesartan.

## 2017-04-10 NOTE — Assessment & Plan Note (Signed)
Changed valsartan 2/2 recall to olmesartan equivalent dose. Has appt next month for BP F/U

## 2017-04-11 MED ORDER — LOSARTAN POTASSIUM 100 MG PO TABS: 100.0000 mg | ORAL_TABLET | Freq: Every day | ORAL | 5 refills | Status: DC

## 2017-04-11 NOTE — Telephone Encounter (Signed)
Attending changed valsartan to olmesartan 2/2 to recall. Received fax from pt's pharmacy stating  olmesartan medoxomil is not covered under pt's plan.  The preferred meds are losartan (Cozaar) and irbesartan (Avapro). Pt has an appt next month for BP F/U.  As new pcp is unable to change/refill medications at this time, will send to attending pool for review and medication change if appropriate. Please advise.Despina Hidden Cassady7/31/201812:23 PM

## 2017-04-11 NOTE — Telephone Encounter (Signed)
Ok. Valsartan 320 mg is about equivalent to Losartan 100 mg, I have sent in that new med to [facility].

## 2017-04-14 ENCOUNTER — Other Ambulatory Visit: Payer: Self-pay | Admitting: *Deleted

## 2017-04-14 MED ORDER — ALPRAZOLAM 0.5 MG PO TABS: ORAL_TABLET | ORAL | 0 refills | Status: DC

## 2017-04-14 NOTE — Telephone Encounter (Signed)
Xanax called in to [facility] pharmacy 

## 2017-04-20 ENCOUNTER — Ambulatory Visit (INDEPENDENT_AMBULATORY_CARE_PROVIDER_SITE_OTHER): Payer: Medicare Other | Admitting: Internal Medicine

## 2017-04-20 VITALS — BP 125/74 | HR 90 | Temp 98.5°F | Wt 177.4 lb

## 2017-04-20 DIAGNOSIS — I10 Essential (primary) hypertension: Secondary | ICD-10-CM | POA: Diagnosis not present

## 2017-04-20 DIAGNOSIS — K219 Gastro-esophageal reflux disease without esophagitis: Secondary | ICD-10-CM | POA: Diagnosis not present

## 2017-04-20 DIAGNOSIS — Z8249 Family history of ischemic heart disease and other diseases of the circulatory system: Secondary | ICD-10-CM

## 2017-04-20 DIAGNOSIS — Z87891 Personal history of nicotine dependence: Secondary | ICD-10-CM

## 2017-04-20 DIAGNOSIS — Z79899 Other long term (current) drug therapy: Secondary | ICD-10-CM

## 2017-04-20 DIAGNOSIS — F411 Generalized anxiety disorder: Secondary | ICD-10-CM | POA: Diagnosis not present

## 2017-04-20 DIAGNOSIS — I2510 Atherosclerotic heart disease of native coronary artery without angina pectoris: Secondary | ICD-10-CM | POA: Diagnosis not present

## 2017-04-20 MED ORDER — BUSPIRONE HCL 10 MG PO TABS: 10.0000 mg | ORAL_TABLET | Freq: Three times a day (TID) | ORAL | 2 refills | Status: DC

## 2017-04-20 NOTE — Assessment & Plan Note (Signed)
Continues to have work and family related stress. Increased her Buspar from 5mg  TID to 10mg  TID at last visit. Has not been able to take it consistently due to abdominal pain that appears to be related to her GERD. Repeat GAD scale score 9, down from prior exam of 12.   Urine tox appropriate on 03/24/2017. Database checked on 03/23/2017.   Plan: - Continue Buspar 10 mg TID  - Continue Xanax 0.5mg  BID PRN

## 2017-04-20 NOTE — Progress Notes (Signed)
   CC: Follow-up management of her Generalized Anxiety Disorder  HPI:  [name] is a 66 y.o. female with a PMHx significant for GAD and GERD presenting for future management of her GAD. Please see problem based charting for evaluation and management of her chronic medical concerns.   Past Medical History:  Diagnosis Date  . 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 Anal fissure   . Anxiety   . Asthma   . CAD (coronary artery disease)    Nonobstructive on cath 2003 and 2005  . Chronic back pain   . Depression   . Diverticulosis of colon (without mention of hemorrhage)   . Dog bite(E906.0)   . GERD (gastroesophageal reflux disease)   . Headache   . Hiatal hernia   . Hypertension   . Irritable bowel syndrome   . Jaundice    Hx of Jaundice at age 72 from "dirty restuarant". Unsure of Hepatitis type  . Lumbar radiculopathy    bilat LE's  . Neuropathy    bilat LE's  . Pain management   . Panic attacks   . Spinal stenosis, lumbar region, without neurogenic claudication    Review of Systems:   Denies fevers, chills.  Endorses N/V, diarrhea  Physical Exam: Vitals:   04/20/17 1422  BP: 125/74  Pulse: 90  Temp: 98.5 F (36.9 C)  TempSrc: Oral  SpO2: 97%  Weight: 177 lb 6.4 oz (80.5 kg)   Physical Exam  Constitutional: She is oriented to person, place, and time and well-developed, well-nourished, and in no distress.  HENT:  Head: Normocephalic and atraumatic.  Eyes: Pupils are equal, round, and reactive to light. Conjunctivae are normal.  Cardiovascular: Normal rate, regular rhythm, normal heart sounds and intact distal pulses.   Pulmonary/Chest: Effort normal and breath sounds normal.  Abdominal: Soft. Bowel sounds are normal. There is no tenderness.  Musculoskeletal: She exhibits no edema.  Neurological: She is alert and oriented to person, place, and time.  Psychiatric: Mood, affect and judgment normal.   Assessment & Plan:   See  Encounters Tab for problem based charting.  Patient seen with Dr. Angelia Mould

## 2017-04-20 NOTE — Assessment & Plan Note (Signed)
Consistently voicing concerns about reflux and stomach pain. Has been taking the Pantoprazole 40 mg BID without improvement. She does not use EtOH or tobacco. Avoids caffeine and eating with 1 hour prior to bed. Follow-up with Dr. Hilarie Fredrickson on 08/26. Given material about dietary changes that may help her GERD.   Plan: - Follow-up with GI

## 2017-04-20 NOTE — Patient Instructions (Addendum)
It was a pleasure to see you again. Please give [place] a call if you need anymore medication refills. Please come back in 3 months or sooner if you have any problems.   Food Choices for Gastroesophageal Reflux Disease, Adult When you have gastroesophageal reflux disease (GERD), the foods you eat and your eating habits are very important. Choosing the right foods can help ease your discomfort. What guidelines do I need to follow?  Choose fruits, vegetables, whole grains, and low-fat dairy products.  Choose low-fat meat, fish, and poultry.  Limit fats such as oils, salad dressings, butter, nuts, and avocado.  Keep a food diary. This helps you identify foods that cause symptoms.  Avoid foods that cause symptoms. These may be different for everyone.  Eat small meals often instead of 3 large meals a day.  Eat your meals slowly, in a place where you are relaxed.  Limit fried foods.  [place] foods using methods other than frying.  Avoid drinking alcohol.  Avoid drinking large amounts of liquids with your meals.  Avoid bending over or lying down until 2-3 hours after eating. What foods are not recommended? These are some foods and drinks that may make your symptoms worse: Vegetables Tomatoes. Tomato juice. Tomato and spaghetti sauce. Chili peppers. Onion and garlic. Horseradish. Fruits Oranges, grapefruit, and lemon (fruit and juice). Meats High-fat meats, fish, and poultry. This includes hot dogs, ribs, ham, sausage, salami, and bacon. Dairy Whole milk and chocolate milk. Sour cream. Cream. Butter. Ice cream. Cream cheese. Drinks Coffee and tea. Bubbly (carbonated) drinks or energy drinks. Condiments Hot sauce. Barbecue sauce. Sweets/Desserts Chocolate and cocoa. Donuts. Peppermint and spearmint. Fats and Oils High-fat foods. This includes [place] fries and potato chips. Other Vinegar. Strong spices. This includes black pepper, white pepper, red pepper, cayenne, curry powder, cloves,  ginger, and chili powder. The items listed above may not be a complete list of foods and drinks to avoid. Contact your dietitian for more information. This information is not intended to replace advice given to you by your health care provider. Make sure you discuss any questions you have with your health care provider. Document Released: 02/28/2012 Document Revised: 02/04/2016 Document Reviewed: 07/03/2013 Elsevier Interactive Patient Education  2017 [employer].

## 2017-04-20 NOTE — Assessment & Plan Note (Signed)
BP 125/74 today. Was switched from valsartan 325mg  QD to losartan 100mg  QD due to safety recall. Tolerating change with no side effects. Denies dizziness, HA, chest pain, soa.   Plan: - Continue Metoprolol 25 mg BID  - Continue losartan 100 mg QD - Continue chlorthalidone 50mg  QD

## 2017-04-24 NOTE — Progress Notes (Signed)
Internal Medicine Clinic Attending  I saw and evaluated the patient.  I personally confirmed the key portions of the history and exam documented by Dr. Helberg and I reviewed pertinent patient test results.  The assessment, diagnosis, and plan were formulated together and I agree with the documentation in the resident's note. 

## 2017-05-01 ENCOUNTER — Encounter: Payer: Self-pay | Admitting: Internal Medicine

## 2017-05-10 ENCOUNTER — Other Ambulatory Visit: Payer: Self-pay | Admitting: *Deleted

## 2017-05-10 MED ORDER — CETIRIZINE HCL 10 MG PO TABS: 10.0000 mg | ORAL_TABLET | Freq: Every day | ORAL | 0 refills | Status: DC

## 2017-05-12 ENCOUNTER — Encounter: Payer: Self-pay | Admitting: *Deleted

## 2017-05-13 DIAGNOSIS — E876 Hypokalemia: Secondary | ICD-10-CM

## 2017-05-13 HISTORY — DX: Hypokalemia: E87.6

## 2017-05-16 ENCOUNTER — Other Ambulatory Visit: Payer: Self-pay | Admitting: Internal Medicine

## 2017-05-17 NOTE — Telephone Encounter (Signed)
ALPRAZolam (XANAX) 0.5 MG tablet, Refill request.

## 2017-05-18 NOTE — Telephone Encounter (Signed)
Refill appears appropriate.  Will refill for 1 month.  Dr. Tarri Abernethy to refill in future.  Thanks.

## 2017-05-18 NOTE — Telephone Encounter (Signed)
Called to pharm 

## 2017-05-18 NOTE — Telephone Encounter (Signed)
Pt is calling back to check if ALPRAZolam (XANAX) 0.5 MG tablet is filled. Please call pt back.

## 2017-05-24 ENCOUNTER — Encounter: Payer: Self-pay | Admitting: Internal Medicine

## 2017-05-24 ENCOUNTER — Ambulatory Visit (INDEPENDENT_AMBULATORY_CARE_PROVIDER_SITE_OTHER): Payer: Medicare Other | Admitting: Internal Medicine

## 2017-05-24 VITALS — BP 151/83 | HR 94 | Temp 98.4°F | Wt 174.8 lb

## 2017-05-24 DIAGNOSIS — F339 Major depressive disorder, recurrent, unspecified: Secondary | ICD-10-CM

## 2017-05-24 DIAGNOSIS — Z7982 Long term (current) use of aspirin: Secondary | ICD-10-CM | POA: Diagnosis not present

## 2017-05-24 DIAGNOSIS — F411 Generalized anxiety disorder: Secondary | ICD-10-CM | POA: Diagnosis not present

## 2017-05-24 DIAGNOSIS — I10 Essential (primary) hypertension: Secondary | ICD-10-CM

## 2017-05-24 DIAGNOSIS — Z888 Allergy status to other drugs, medicaments and biological substances status: Secondary | ICD-10-CM | POA: Diagnosis not present

## 2017-05-24 DIAGNOSIS — Z87891 Personal history of nicotine dependence: Secondary | ICD-10-CM | POA: Diagnosis not present

## 2017-05-24 DIAGNOSIS — Z79899 Other long term (current) drug therapy: Secondary | ICD-10-CM

## 2017-05-24 DIAGNOSIS — Z9181 History of falling: Secondary | ICD-10-CM

## 2017-05-24 MED ORDER — EPINEPHRINE 0.3 MG/0.3ML IJ SOAJ: 0.3000 mg | Freq: Once | INTRAMUSCULAR | 0 refills | Status: AC

## 2017-05-24 MED ORDER — ESCITALOPRAM OXALATE 10 MG PO TABS: 10.0000 mg | ORAL_TABLET | Freq: Every day | ORAL | 2 refills | Status: DC

## 2017-05-24 NOTE — Assessment & Plan Note (Signed)
BP Readings from Last 3 Encounters:  05/24/17 (!) 151/83  04/20/17 125/74  03/23/17 131/80   Patient was slightly hypertensive today and she attributes it to being more anxious. She took all of her medications  Plan -continue current medications -reassess in 4-6 weeks, once her anxiety is better controlled

## 2017-05-24 NOTE — Patient Instructions (Addendum)
Thank you for your visit today Please start taking the lexapro 10 mg medicine daily. Please stop the Buspar. Please continue your xanax. Please take the Breo inhaler DAILY- not as needed Please also continue to see your therapist/ counselor.  Please follow up here in 4-6 weeks
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
## 2017-05-25 ENCOUNTER — Encounter (HOSPITAL_COMMUNITY): Payer: Self-pay | Admitting: *Deleted

## 2017-05-25 DIAGNOSIS — I10 Essential (primary) hypertension: Secondary | ICD-10-CM | POA: Diagnosis not present

## 2017-05-25 DIAGNOSIS — F419 Anxiety disorder, unspecified: Secondary | ICD-10-CM | POA: Diagnosis not present

## 2017-05-25 DIAGNOSIS — Z91040 Latex allergy status: Secondary | ICD-10-CM | POA: Diagnosis not present

## 2017-05-25 DIAGNOSIS — E876 Hypokalemia: Secondary | ICD-10-CM | POA: Diagnosis not present

## 2017-05-25 DIAGNOSIS — Z87891 Personal history of nicotine dependence: Secondary | ICD-10-CM | POA: Insufficient documentation

## 2017-05-25 DIAGNOSIS — Z7982 Long term (current) use of aspirin: Secondary | ICD-10-CM | POA: Diagnosis not present

## 2017-05-25 DIAGNOSIS — R110 Nausea: Secondary | ICD-10-CM | POA: Diagnosis present

## 2017-05-25 DIAGNOSIS — Z79899 Other long term (current) drug therapy: Secondary | ICD-10-CM | POA: Diagnosis not present

## 2017-05-25 DIAGNOSIS — F329 Major depressive disorder, single episode, unspecified: Secondary | ICD-10-CM | POA: Diagnosis not present

## 2017-05-25 DIAGNOSIS — Z7902 Long term (current) use of antithrombotics/antiplatelets: Secondary | ICD-10-CM | POA: Diagnosis not present

## 2017-05-25 DIAGNOSIS — Z9049 Acquired absence of other specified parts of digestive tract: Secondary | ICD-10-CM | POA: Insufficient documentation

## 2017-05-25 DIAGNOSIS — I2510 Atherosclerotic heart disease of native coronary artery without angina pectoris: Secondary | ICD-10-CM | POA: Insufficient documentation

## 2017-05-25 DIAGNOSIS — J45909 Unspecified asthma, uncomplicated: Secondary | ICD-10-CM | POA: Insufficient documentation

## 2017-05-25 NOTE — ED Triage Notes (Signed)
Pt c/o nausea with numbness and tingling  Both her arms and legs  Today she thinks it meds she took her first dose yesterday.

## 2017-05-25 NOTE — Progress Notes (Signed)
Patient ID: [name], female   DOB: 1951-04-24, 66 y.o.   MRN: [id number]  Case discussed with Dr. Tiburcio Pea at the time of the visit. We reviewed the resident's history and exam and pertinent patient test results. I agree with the assessment, diagnosis, and plan of care documented in the resident's note.

## 2017-05-26 ENCOUNTER — Emergency Department (HOSPITAL_COMMUNITY)
Admission: EM | Admit: 2017-05-26 | Discharge: 2017-05-26 | Disposition: A | Discharge status: Home / Self Care | Payer: Medicare Other | Attending: Emergency Medicine | Admitting: Emergency Medicine

## 2017-05-26 DIAGNOSIS — E876 Hypokalemia: Secondary | ICD-10-CM | POA: Diagnosis not present

## 2017-05-26 LAB — CBC
HCT: 39 % (ref 36.0–46.0)
Hemoglobin: 13.3 g/dL (ref 12.0–15.0)
MCH: 30.1 pg (ref 26.0–34.0)
MCHC: 34.1 g/dL (ref 30.0–36.0)
MCV: 88.2 fL (ref 78.0–100.0)
PLATELETS: 218 10*3/uL (ref 150–400)
RBC: 4.42 MIL/uL (ref 3.87–5.11)
RDW: 13.3 % (ref 11.5–15.5)
WBC: 6.8 10*3/uL (ref 4.0–10.5)

## 2017-05-26 LAB — URINALYSIS, ROUTINE W REFLEX MICROSCOPIC
BILIRUBIN URINE: NEGATIVE
GLUCOSE, UA: NEGATIVE mg/dL
HGB URINE DIPSTICK: NEGATIVE
Ketones, ur: NEGATIVE mg/dL
Leukocytes, UA: NEGATIVE
Nitrite: NEGATIVE
PROTEIN: NEGATIVE mg/dL
Specific Gravity, Urine: 1.006 (ref 1.005–1.030)
pH: 6 (ref 5.0–8.0)

## 2017-05-26 LAB — COMPREHENSIVE METABOLIC PANEL
ALK PHOS: 58 U/L (ref 38–126)
ALT: 34 U/L (ref 14–54)
AST: 27 U/L (ref 15–41)
Albumin: 4.3 g/dL (ref 3.5–5.0)
Anion gap: 10 (ref 5–15)
BUN: 12 mg/dL (ref 6–20)
CHLORIDE: 99 mmol/L — AB (ref 101–111)
CO2: 30 mmol/L (ref 22–32)
CREATININE: 0.96 mg/dL (ref 0.44–1.00)
Calcium: 10.4 mg/dL — ABNORMAL HIGH (ref 8.9–10.3)
GFR calc Af Amer: >60 mL/min (ref >60)
GFR calc non Af Amer: >60 mL/min (ref >60)
Glucose, Bld: 128 mg/dL — ABNORMAL HIGH (ref 65–99)
Potassium: 2.9 mmol/L — ABNORMAL LOW (ref 3.5–5.1)
SODIUM: 139 mmol/L (ref 135–145)
Total Bilirubin: 1.2 mg/dL (ref 0.3–1.2)
Total Protein: 7.8 g/dL (ref 6.5–8.1)

## 2017-05-26 LAB — LIPASE, BLOOD: LIPASE: 40 U/L (ref 11–51)

## 2017-05-26 MED ORDER — SODIUM CHLORIDE 0.9 % IV SOLN
Freq: Once | INTRAVENOUS | Status: AC
  Administered 2017-05-26: 100 mL/h via INTRAVENOUS

## 2017-05-26 MED ORDER — ALPRAZOLAM 0.25 MG PO TABS
0.5000 mg | ORAL_TABLET | Freq: Once | ORAL | Status: AC
  Administered 2017-05-26: 0.5 mg via ORAL
  Filled 2017-05-26: qty 2

## 2017-05-26 MED ORDER — POTASSIUM CHLORIDE CRYS ER 20 MEQ PO TBCR
40.0000 meq | EXTENDED_RELEASE_TABLET | Freq: Once | ORAL | Status: AC
  Administered 2017-05-26: 40 meq via ORAL
  Filled 2017-05-26: qty 2

## 2017-05-26 MED ORDER — POTASSIUM CHLORIDE 10 MEQ/100ML IV SOLN
10.0000 meq | Freq: Once | INTRAVENOUS | Status: AC
  Administered 2017-05-26: 10 meq via INTRAVENOUS
  Filled 2017-05-26: qty 100

## 2017-05-26 MED ORDER — ONDANSETRON HCL 4 MG/2ML IJ SOLN
4.0000 mg | Freq: Once | INTRAMUSCULAR | Status: AC
  Administered 2017-05-26: 4 mg via INTRAVENOUS
  Filled 2017-05-26: qty 2
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
## 2017-05-26 NOTE — Discharge Instructions (Signed)
Recommend to have your potassium rechecked with your doctor soon. I would also take with them about your lexapro but I would continue your other home medications. You can return here for any new/worsening symptoms.

## 2017-05-29 ENCOUNTER — Other Ambulatory Visit: Payer: Self-pay

## 2017-05-29 ENCOUNTER — Encounter (HOSPITAL_COMMUNITY): Payer: Self-pay | Admitting: Emergency Medicine

## 2017-05-29 ENCOUNTER — Emergency Department (HOSPITAL_COMMUNITY)
Admission: EM | Admit: 2017-05-29 | Discharge: 2017-05-29 | Disposition: A | Discharge status: Home / Self Care | Payer: Medicare Other | Attending: Emergency Medicine | Admitting: Emergency Medicine

## 2017-05-29 DIAGNOSIS — Z91040 Latex allergy status: Secondary | ICD-10-CM | POA: Diagnosis not present

## 2017-05-29 DIAGNOSIS — J45909 Unspecified asthma, uncomplicated: Secondary | ICD-10-CM | POA: Diagnosis not present

## 2017-05-29 DIAGNOSIS — Z87891 Personal history of nicotine dependence: Secondary | ICD-10-CM | POA: Diagnosis not present

## 2017-05-29 DIAGNOSIS — I2510 Atherosclerotic heart disease of native coronary artery without angina pectoris: Secondary | ICD-10-CM | POA: Insufficient documentation

## 2017-05-29 DIAGNOSIS — E876 Hypokalemia: Secondary | ICD-10-CM

## 2017-05-29 DIAGNOSIS — I6789 Other cerebrovascular disease: Secondary | ICD-10-CM | POA: Diagnosis not present

## 2017-05-29 DIAGNOSIS — R000 Tachycardia, unspecified: Secondary | ICD-10-CM | POA: Diagnosis not present

## 2017-05-29 DIAGNOSIS — Z79899 Other long term (current) drug therapy: Secondary | ICD-10-CM | POA: Diagnosis not present

## 2017-05-29 DIAGNOSIS — R251 Tremor, unspecified: Secondary | ICD-10-CM

## 2017-05-29 LAB — HEPATIC FUNCTION PANEL
ALT: 33 U/L (ref 14–54)
AST: 33 U/L (ref 15–41)
Albumin: 4.2 g/dL (ref 3.5–5.0)
Alkaline Phosphatase: 52 U/L (ref 38–126)
BILIRUBIN DIRECT: 0.1 mg/dL (ref 0.1–0.5)
BILIRUBIN INDIRECT: 1.2 mg/dL — AB (ref 0.3–0.9)
BILIRUBIN TOTAL: 1.3 mg/dL — AB (ref 0.3–1.2)
Total Protein: 7.4 g/dL (ref 6.5–8.1)

## 2017-05-29 LAB — BASIC METABOLIC PANEL
ANION GAP: 12 (ref 5–15)
BUN: 14 mg/dL (ref 6–20)
CHLORIDE: 101 mmol/L (ref 101–111)
CO2: 26 mmol/L (ref 22–32)
Calcium: 9.8 mg/dL (ref 8.9–10.3)
Creatinine, Ser: 0.96 mg/dL (ref 0.44–1.00)
Glucose, Bld: 162 mg/dL — ABNORMAL HIGH (ref 65–99)
POTASSIUM: 2.4 mmol/L — AB (ref 3.5–5.1)
SODIUM: 139 mmol/L (ref 135–145)

## 2017-05-29 LAB — CBC
HEMATOCRIT: 37.8 % (ref 36.0–46.0)
Hemoglobin: 13 g/dL (ref 12.0–15.0)
MCH: 30.4 pg (ref 26.0–34.0)
MCHC: 34.4 g/dL (ref 30.0–36.0)
MCV: 88.3 fL (ref 78.0–100.0)
Platelets: 217 10*3/uL (ref 150–400)
RBC: 4.28 MIL/uL (ref 3.87–5.11)
RDW: 13.1 % (ref 11.5–15.5)
WBC: 5.6 10*3/uL (ref 4.0–10.5)

## 2017-05-29 LAB — URINALYSIS, ROUTINE W REFLEX MICROSCOPIC
BILIRUBIN URINE: NEGATIVE
GLUCOSE, UA: NEGATIVE mg/dL
Hgb urine dipstick: NEGATIVE
KETONES UR: NEGATIVE mg/dL
LEUKOCYTES UA: NEGATIVE
NITRITE: NEGATIVE
PH: 7 (ref 5.0–8.0)
PROTEIN: 30 mg/dL — AB
Specific Gravity, Urine: 1.006 (ref 1.005–1.030)

## 2017-05-29 LAB — I-STAT TROPONIN, ED: TROPONIN I, POC: 0.01 ng/mL (ref 0.00–0.08)

## 2017-05-29 LAB — CBG MONITORING, ED: Glucose-Capillary: 172 mg/dL — ABNORMAL HIGH (ref 65–99)

## 2017-05-29 MED ORDER — LORAZEPAM 2 MG/ML IJ SOLN
1.0000 mg | Freq: Once | INTRAMUSCULAR | Status: AC
  Administered 2017-05-29: 1 mg via INTRAVENOUS
  Filled 2017-05-29: qty 1

## 2017-05-29 MED ORDER — POTASSIUM CHLORIDE CRYS ER 20 MEQ PO TBCR: 40.0000 meq | EXTENDED_RELEASE_TABLET | Freq: Every day | ORAL | 0 refills | Status: DC

## 2017-05-29 MED ORDER — POTASSIUM CHLORIDE 10 MEQ/100ML IV SOLN
10.0000 meq | Freq: Once | INTRAVENOUS | Status: AC
  Administered 2017-05-29: 10 meq via INTRAVENOUS
  Filled 2017-05-29: qty 100

## 2017-05-29 MED ORDER — POTASSIUM CHLORIDE CRYS ER 20 MEQ PO TBCR
40.0000 meq | EXTENDED_RELEASE_TABLET | Freq: Once | ORAL | Status: AC
  Administered 2017-05-29: 40 meq via ORAL
  Filled 2017-05-29: qty 2

## 2017-05-29 MED ORDER — SODIUM CHLORIDE 0.9 % IV BOLUS (SEPSIS)
1000.0000 mL | Freq: Once | INTRAVENOUS | Status: AC
  Administered 2017-05-29: 1000 mL via INTRAVENOUS

## 2017-05-29 MED ORDER — ONDANSETRON HCL 4 MG/2ML IJ SOLN
4.0000 mg | Freq: Once | INTRAMUSCULAR | Status: AC
  Administered 2017-05-29: 4 mg via INTRAVENOUS
  Filled 2017-05-29: qty 2

## 2017-05-29 NOTE — ED Notes (Signed)
Neurologist at bedside. 
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
## 2017-05-29 NOTE — Discharge Instructions (Signed)
You were seen in the ED today with tremor. We found your potassium to be low and you will need to take pills for several days to increase this level in the blood. Call your PCP today and schedule a follow up appointment and repeat potassium level. If tremors return, take your Xanax at home. Call the Neurologist listed to arrange for outpatient evaluation of your symptoms.
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
## 2017-05-29 NOTE — ED Triage Notes (Signed)
Pt was last seen last night around 2200, this morning she woke her son up around 4:30 w/ tremors.  Pt was able to hold hand still when IV was being inserted, and is able to follow directions on command.  Pt was able to get to the EMS stretcher however had difficulty getting off ours.

## 2017-05-31 ENCOUNTER — Encounter (HOSPITAL_COMMUNITY): Payer: Self-pay

## 2017-05-31 ENCOUNTER — Emergency Department (HOSPITAL_COMMUNITY)
Admission: EM | Admit: 2017-05-31 | Discharge: 2017-05-31 | Disposition: A | Discharge status: Home / Self Care | Payer: Medicare Other | Attending: Emergency Medicine | Admitting: Emergency Medicine

## 2017-05-31 ENCOUNTER — Ambulatory Visit: Payer: Medicare Other

## 2017-05-31 DIAGNOSIS — G252 Other specified forms of tremor: Secondary | ICD-10-CM

## 2017-05-31 DIAGNOSIS — Z79899 Other long term (current) drug therapy: Secondary | ICD-10-CM | POA: Diagnosis not present

## 2017-05-31 DIAGNOSIS — R251 Tremor, unspecified: Secondary | ICD-10-CM | POA: Diagnosis not present

## 2017-05-31 DIAGNOSIS — J45909 Unspecified asthma, uncomplicated: Secondary | ICD-10-CM | POA: Insufficient documentation

## 2017-05-31 DIAGNOSIS — Z7982 Long term (current) use of aspirin: Secondary | ICD-10-CM | POA: Diagnosis not present

## 2017-05-31 DIAGNOSIS — Z87891 Personal history of nicotine dependence: Secondary | ICD-10-CM | POA: Diagnosis not present

## 2017-05-31 DIAGNOSIS — I10 Essential (primary) hypertension: Secondary | ICD-10-CM | POA: Insufficient documentation

## 2017-05-31 DIAGNOSIS — I2510 Atherosclerotic heart disease of native coronary artery without angina pectoris: Secondary | ICD-10-CM | POA: Insufficient documentation

## 2017-05-31 LAB — COMPREHENSIVE METABOLIC PANEL
ALT: 31 U/L (ref 14–54)
ANION GAP: 11 (ref 5–15)
AST: 31 U/L (ref 15–41)
Albumin: 4.1 g/dL (ref 3.5–5.0)
Alkaline Phosphatase: 50 U/L (ref 38–126)
BUN: 13 mg/dL (ref 6–20)
CHLORIDE: 104 mmol/L (ref 101–111)
CO2: 25 mmol/L (ref 22–32)
Calcium: 10 mg/dL (ref 8.9–10.3)
Creatinine, Ser: 1.04 mg/dL — ABNORMAL HIGH (ref 0.44–1.00)
GFR, EST NON AFRICAN AMERICAN: 56 mL/min — AB (ref >60)
Glucose, Bld: 145 mg/dL — ABNORMAL HIGH (ref 65–99)
POTASSIUM: 2.9 mmol/L — AB (ref 3.5–5.1)
SODIUM: 140 mmol/L (ref 135–145)
Total Bilirubin: 1 mg/dL (ref 0.3–1.2)
Total Protein: 6.6 g/dL (ref 6.5–8.1)

## 2017-05-31 LAB — CBC
HCT: 38 % (ref 36.0–46.0)
Hemoglobin: 12.7 g/dL (ref 12.0–15.0)
MCH: 29.7 pg (ref 26.0–34.0)
MCHC: 33.4 g/dL (ref 30.0–36.0)
MCV: 89 fL (ref 78.0–100.0)
PLATELETS: 243 10*3/uL (ref 150–400)
RBC: 4.27 MIL/uL (ref 3.87–5.11)
RDW: 13.3 % (ref 11.5–15.5)
WBC: 7 10*3/uL (ref 4.0–10.5)

## 2017-05-31 LAB — MAGNESIUM: MAGNESIUM: 1.4 mg/dL — AB (ref 1.7–2.4)

## 2017-05-31 NOTE — ED Notes (Signed)
Pt shaking but following commands.

## 2017-05-31 NOTE — ED Notes (Signed)
Pt talked too and soothed, left alone in room away from people and shaking has stopped

## 2017-05-31 NOTE — Discharge Instructions (Signed)
Please read attached information. If you experience any new or worsening signs or symptoms please return to the emergency room for evaluation. Please follow-up with your primary care provider or specialist as discussed.  °

## 2017-05-31 NOTE — ED Notes (Signed)
Pt family member at bedside Report they have been to ED 3X in 4 days for her tremor. Pt reports taking K+ and Xanax at home with no relief of tremor. Pt states there is nothing that makes her tremor worse or better.

## 2017-05-31 NOTE — ED Triage Notes (Signed)
Pt comes with episode of shaking to the arms, pt is able to communicate, but mumbles to get words out. Seen here two days ago for the same.
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
## 2017-06-02 ENCOUNTER — Ambulatory Visit (INDEPENDENT_AMBULATORY_CARE_PROVIDER_SITE_OTHER): Payer: Medicare Other | Admitting: Internal Medicine

## 2017-06-02 ENCOUNTER — Telehealth: Payer: Self-pay | Admitting: Internal Medicine

## 2017-06-02 ENCOUNTER — Other Ambulatory Visit: Payer: Self-pay | Admitting: Internal Medicine

## 2017-06-02 VITALS — BP 135/58 | HR 74 | Temp 98.0°F | Ht 63.0 in | Wt 175.7 lb

## 2017-06-02 DIAGNOSIS — I10 Essential (primary) hypertension: Secondary | ICD-10-CM | POA: Diagnosis not present

## 2017-06-02 DIAGNOSIS — E876 Hypokalemia: Secondary | ICD-10-CM

## 2017-06-02 DIAGNOSIS — Z23 Encounter for immunization: Secondary | ICD-10-CM

## 2017-06-02 DIAGNOSIS — Z79899 Other long term (current) drug therapy: Secondary | ICD-10-CM | POA: Diagnosis not present

## 2017-06-02 DIAGNOSIS — F411 Generalized anxiety disorder: Secondary | ICD-10-CM

## 2017-06-02 DIAGNOSIS — Z87891 Personal history of nicotine dependence: Secondary | ICD-10-CM

## 2017-06-02 DIAGNOSIS — Z7982 Long term (current) use of aspirin: Secondary | ICD-10-CM

## 2017-06-02 DIAGNOSIS — F339 Major depressive disorder, recurrent, unspecified: Secondary | ICD-10-CM | POA: Diagnosis not present

## 2017-06-02 DIAGNOSIS — R251 Tremor, unspecified: Secondary | ICD-10-CM

## 2017-06-02 DIAGNOSIS — Z888 Allergy status to other drugs, medicaments and biological substances status: Secondary | ICD-10-CM | POA: Diagnosis not present

## 2017-06-02 LAB — BASIC METABOLIC PANEL
Anion gap: 6 (ref 5–15)
BUN: 13 mg/dL (ref 6–20)
CALCIUM: 10.4 mg/dL — AB (ref 8.9–10.3)
CO2: 31 mmol/L (ref 22–32)
CREATININE: 0.99 mg/dL (ref 0.44–1.00)
Chloride: 103 mmol/L (ref 101–111)
GFR calc Af Amer: >60 mL/min (ref >60)
GFR, EST NON AFRICAN AMERICAN: 59 mL/min — AB (ref >60)
Glucose, Bld: 117 mg/dL — ABNORMAL HIGH (ref 65–99)
Potassium: 3.1 mmol/L — ABNORMAL LOW (ref 3.5–5.1)
SODIUM: 140 mmol/L (ref 135–145)

## 2017-06-02 MED ORDER — MAGNESIUM CHLORIDE 64 MG PO TBEC: 2.0000 | DELAYED_RELEASE_TABLET | Freq: Every day | ORAL | 0 refills | Status: DC

## 2017-06-02 MED ORDER — POTASSIUM CHLORIDE CRYS ER 10 MEQ PO TBCR: 10.0000 meq | EXTENDED_RELEASE_TABLET | Freq: Every day | ORAL | 0 refills | Status: DC

## 2017-06-02 MED ORDER — CLONAZEPAM 0.5 MG PO TABS: 0.5000 mg | ORAL_TABLET | Freq: Two times a day (BID) | ORAL | 0 refills | Status: DC
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
## 2017-06-02 NOTE — Patient Instructions (Signed)
Stop taking lexapro.  Stop taking xanax.  Start taking clonazepam 0.5mg  twice daily.  You have an appointment with neurology on Oct 17th. I will place a referral for a different psychiatrist office in town, which I think will be beneficial.   Hyperventilation Hyperventilation is breathing more deeply and more rapidly than normal. An episode of hyperventilation usually lasts 20-30 minutes. During an episode:  You may feel breathless.  Your hands, feet, or mouth may tingle, spasm, or feel numb.  Follow these instructions at home:  Learn and use breathing exercises that help you breathe from your diaphragm and abdomen.  Practice relaxation techniques to reduce stress, such as visualization, meditation, and muscle release.  During an attack, try breathing into a paper bag. This slows down breathing. Contact a health care provider if:  You continue to have episodes of hyperventilation.  Your hyperventilation gets worse. This information is not intended to replace advice given to you by your health care provider. Make sure you discuss any questions you have with your health care provider. Document Released: 08/26/2000 Document Revised: 05/24/2016 Document Reviewed: 05/24/2016 Elsevier Interactive Patient Education  [employer].

## 2017-06-02 NOTE — Telephone Encounter (Signed)
Attempted calling patient at home and mobile number as well as her son (emergency contact and present at today's visit) about patient's low potassium and addition of supplementation. Left message on patient's cell phone to call back clinic to discuss medication changes as detailed below.  Her potassium was 3.3 today after 2 days of oral Kdur 51mEq daily. Her magnesium was low at 1.4 in the ED 2 days ago and does no appear to have been repleted.  I sent in magnesium chloride 64mg  two tablets daily and potassium chloride 51mEq one tablet daily for two week. I would like to have her back in 2 weeks to recheck her potassium and magnesium levels.  Alphonzo Grieve, MD IMTS - PGY2 Pager [phone]

## 2017-06-03 ENCOUNTER — Encounter: Payer: Self-pay | Admitting: Internal Medicine

## 2017-06-03 NOTE — Assessment & Plan Note (Signed)
Patient with hypomagnesemia and hypokalemia; she completed 4 day course of 37mEq daily KDur with repeat K today at 3.1. She did not receive magnesium supplementation.  Plan: --Magnesium chloride 128mg  daily and KDur 10mEq daily, f/u in 2 weeks for repeat labs --patient and son unable to be reached so left message to call clinic back for instructions.

## 2017-06-03 NOTE — Assessment & Plan Note (Signed)
Patient with poorly controlled GAD and now episodes consistent with panic attacks. Patient experienced 2 such episodes during her visit today that were exacerbated by directly asking about her symptoms and her son sharing what he has observed the last few days. Symptoms subsided within a few minutes with consolation and breathing exercises. During these episodes, patient was not observed to have any focal deficits, was answering questions appropriately and following commands.   Patient already evaluated by neurology in the ED with similar findings as above and patient will f/u with them next month.  Plan: --address her hypoK and hypoMg though unlikely to be related to her symptoms --D/C xanax, start clonazepam 0.5mg  BID #60 --referral placed for psychiatry  --patient to f/u with neuro in Oct

## 2017-06-05 ENCOUNTER — Other Ambulatory Visit: Payer: Self-pay

## 2017-06-05 ENCOUNTER — Inpatient Hospital Stay (HOSPITAL_COMMUNITY)
Admission: AD | Admit: 2017-06-05 | Discharge: 2017-06-07 | DRG: 641 | Disposition: A | Discharge status: Home / Self Care | Payer: Medicare Other | Source: Ambulatory Visit | Attending: Internal Medicine | Admitting: Internal Medicine

## 2017-06-05 ENCOUNTER — Telehealth: Payer: Self-pay | Admitting: Internal Medicine

## 2017-06-05 DIAGNOSIS — Z888 Allergy status to other drugs, medicaments and biological substances status: Secondary | ICD-10-CM | POA: Diagnosis not present

## 2017-06-05 DIAGNOSIS — Z882 Allergy status to sulfonamides status: Secondary | ICD-10-CM

## 2017-06-05 DIAGNOSIS — Z8249 Family history of ischemic heart disease and other diseases of the circulatory system: Secondary | ICD-10-CM

## 2017-06-05 DIAGNOSIS — R200 Anesthesia of skin: Secondary | ICD-10-CM | POA: Diagnosis present

## 2017-06-05 DIAGNOSIS — Z885 Allergy status to narcotic agent status: Secondary | ICD-10-CM | POA: Diagnosis not present

## 2017-06-05 DIAGNOSIS — R251 Tremor, unspecified: Secondary | ICD-10-CM | POA: Diagnosis present

## 2017-06-05 DIAGNOSIS — J45909 Unspecified asthma, uncomplicated: Secondary | ICD-10-CM | POA: Diagnosis present

## 2017-06-05 DIAGNOSIS — Z91040 Latex allergy status: Secondary | ICD-10-CM | POA: Diagnosis not present

## 2017-06-05 DIAGNOSIS — I2510 Atherosclerotic heart disease of native coronary artery without angina pectoris: Secondary | ICD-10-CM | POA: Diagnosis present

## 2017-06-05 DIAGNOSIS — I10 Essential (primary) hypertension: Secondary | ICD-10-CM | POA: Diagnosis not present

## 2017-06-05 DIAGNOSIS — F329 Major depressive disorder, single episode, unspecified: Secondary | ICD-10-CM | POA: Diagnosis present

## 2017-06-05 DIAGNOSIS — K219 Gastro-esophageal reflux disease without esophagitis: Secondary | ICD-10-CM | POA: Diagnosis present

## 2017-06-05 DIAGNOSIS — F411 Generalized anxiety disorder: Secondary | ICD-10-CM | POA: Diagnosis present

## 2017-06-05 DIAGNOSIS — E876 Hypokalemia: Secondary | ICD-10-CM | POA: Diagnosis not present

## 2017-06-05 DIAGNOSIS — F410 Panic disorder [episodic paroxysmal anxiety] without agoraphobia: Secondary | ICD-10-CM | POA: Diagnosis present

## 2017-06-05 DIAGNOSIS — Z87891 Personal history of nicotine dependence: Secondary | ICD-10-CM

## 2017-06-05 DIAGNOSIS — Z853 Personal history of malignant neoplasm of breast: Secondary | ICD-10-CM

## 2017-06-05 DIAGNOSIS — Z880 Allergy status to penicillin: Secondary | ICD-10-CM

## 2017-06-05 DIAGNOSIS — R202 Paresthesia of skin: Secondary | ICD-10-CM | POA: Diagnosis present

## 2017-06-05 DIAGNOSIS — E785 Hyperlipidemia, unspecified: Secondary | ICD-10-CM | POA: Diagnosis present

## 2017-06-05 HISTORY — DX: Hypokalemia: E87.6

## 2017-06-05 LAB — COMPREHENSIVE METABOLIC PANEL
ALBUMIN: 4.3 g/dL (ref 3.5–5.0)
ALK PHOS: 53 U/L (ref 38–126)
ALT: 32 U/L (ref 14–54)
AST: 26 U/L (ref 15–41)
Anion gap: 10 (ref 5–15)
BILIRUBIN TOTAL: 1.2 mg/dL (ref 0.3–1.2)
BUN: 11 mg/dL (ref 6–20)
CALCIUM: 10 mg/dL (ref 8.9–10.3)
CO2: 28 mmol/L (ref 22–32)
Chloride: 100 mmol/L — ABNORMAL LOW (ref 101–111)
Creatinine, Ser: 0.98 mg/dL (ref 0.44–1.00)
GFR calc Af Amer: >60 mL/min (ref >60)
GFR calc non Af Amer: 60 mL/min — ABNORMAL LOW (ref >60)
GLUCOSE: 116 mg/dL — AB (ref 65–99)
POTASSIUM: 2.8 mmol/L — AB (ref 3.5–5.1)
SODIUM: 138 mmol/L (ref 135–145)
TOTAL PROTEIN: 7 g/dL (ref 6.5–8.1)

## 2017-06-05 LAB — CBC
HEMATOCRIT: 36.9 % (ref 36.0–46.0)
HEMOGLOBIN: 12.5 g/dL (ref 12.0–15.0)
MCH: 30.3 pg (ref 26.0–34.0)
MCHC: 33.9 g/dL (ref 30.0–36.0)
MCV: 89.3 fL (ref 78.0–100.0)
Platelets: 233 10*3/uL (ref 150–400)
RBC: 4.13 MIL/uL (ref 3.87–5.11)
RDW: 13.4 % (ref 11.5–15.5)
WBC: 6.3 10*3/uL (ref 4.0–10.5)

## 2017-06-05 LAB — MAGNESIUM: Magnesium: 1.4 mg/dL — ABNORMAL LOW (ref 1.7–2.4)

## 2017-06-05 MED ORDER — ENOXAPARIN SODIUM 40 MG/0.4ML ~~LOC~~ SOLN
40.0000 mg | SUBCUTANEOUS | Status: DC
  Administered 2017-06-05 – 2017-06-06 (×2): 40 mg via SUBCUTANEOUS
  Filled 2017-06-05 (×3): qty 0.4

## 2017-06-05 MED ORDER — CLONAZEPAM 0.5 MG PO TABS: 0.5000 mg | ORAL_TABLET | Freq: Two times a day (BID) | ORAL | Status: DC

## 2017-06-05 MED ORDER — MAGNESIUM SULFATE 2 GM/50ML IV SOLN
2.0000 g | Freq: Once | INTRAVENOUS | Status: AC
  Administered 2017-06-05: 2 g via INTRAVENOUS
  Filled 2017-06-05: qty 50

## 2017-06-05 MED ORDER — PANTOPRAZOLE SODIUM 40 MG PO TBEC
40.0000 mg | DELAYED_RELEASE_TABLET | Freq: Two times a day (BID) | ORAL | Status: DC
  Administered 2017-06-06 – 2017-06-07 (×3): 40 mg via ORAL
  Filled 2017-06-05 (×3): qty 1

## 2017-06-05 MED ORDER — METOPROLOL TARTRATE 25 MG PO TABS
25.0000 mg | ORAL_TABLET | Freq: Two times a day (BID) | ORAL | Status: DC
  Administered 2017-06-05 – 2017-06-07 (×4): 25 mg via ORAL
  Filled 2017-06-05 (×4): qty 1

## 2017-06-05 MED ORDER — ASPIRIN EC 81 MG PO TBEC
81.0000 mg | DELAYED_RELEASE_TABLET | Freq: Every day | ORAL | Status: DC
  Administered 2017-06-06 – 2017-06-07 (×2): 81 mg via ORAL
  Filled 2017-06-05 (×2): qty 1

## 2017-06-05 MED ORDER — POTASSIUM CHLORIDE CRYS ER 20 MEQ PO TBCR
40.0000 meq | EXTENDED_RELEASE_TABLET | ORAL | Status: DC
  Administered 2017-06-05: 40 meq via ORAL
  Filled 2017-06-05: qty 2

## 2017-06-05 MED ORDER — POTASSIUM CHLORIDE 10 MEQ/100ML IV SOLN
10.0000 meq | INTRAVENOUS | Status: DC
  Administered 2017-06-05 – 2017-06-06 (×4): 10 meq via INTRAVENOUS
  Filled 2017-06-05 (×4): qty 100

## 2017-06-05 MED ORDER — PRAVASTATIN SODIUM 40 MG PO TABS
40.0000 mg | ORAL_TABLET | Freq: Every day | ORAL | Status: DC
  Administered 2017-06-05 – 2017-06-07 (×3): 40 mg via ORAL
  Filled 2017-06-05 (×3): qty 1

## 2017-06-05 MED ORDER — CLONAZEPAM 0.5 MG PO TABS
0.5000 mg | ORAL_TABLET | Freq: Two times a day (BID) | ORAL | Status: DC
  Administered 2017-06-05: 0.5 mg via ORAL
  Filled 2017-06-05: qty 1

## 2017-06-05 NOTE — Progress Notes (Signed)
Assessed IV in the left posterior wrist d/t patient c/o pain. She has K+ running through the line with NS. I flushed the IV pt did not c/o of any pain. Once I reconnected the K+ she no longer c/o pain. I advised the nurse that she may need to restart the IV if pain resumes. [name]

## 2017-06-05 NOTE — Telephone Encounter (Signed)
I am worried we are seeing my symptoms from her hypokalemia here. If she cannot take orals because of nausea, then we definitely wont be able to replace the potassium. I would advise admission at this point. She can come in through the ED, we can give IV mag and potassium, and work up the potassium loss. We can also make sure she gets enough benzos to prevent withdrawal, in case that is the reason for the nausea.  I spoke with Dr. Jari Favre about this plan, she saw the patient on Friday. She is going to get in touch with the patient or her son about next steps.

## 2017-06-05 NOTE — Telephone Encounter (Signed)
Refill request denied, needs appt for repeat labs (from Oct 5th).

## 2017-06-05 NOTE — Progress Notes (Signed)
Patient arrived to unit. MD notified. Awaiting orders. Will continue to monitor.
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
## 2017-06-05 NOTE — Telephone Encounter (Signed)
Pt calls and states she has not slept in 2 days, states since starting the clonazepam she has nausea, numbness, weakness in her arms, legs and increased tremors. She states I am really sick, advised ED, she does not want to have to wait for a long time, offered a 1545 today and states she cant get here coming from [place] that quickly, suggested urgent care and she states that might work if not she will call in am for appt in [facility]. She ask if she should take valium, answered that nurse cannot advise what to take but she is strongly advised not to continue the clonazepam until she is evaluated by a physician, do you agree? Please advise

## 2017-06-05 NOTE — Progress Notes (Signed)
Internal Medicine Clinic Attending  Case discussed with Dr. Svalina  at the time of the visit.  We reviewed the resident's history and exam and pertinent patient test results.  I agree with the assessment, diagnosis, and plan of care documented in the resident's note.  

## 2017-06-05 NOTE — Telephone Encounter (Signed)
Pls call patient has new medicine making her really sick

## 2017-06-05 NOTE — Telephone Encounter (Signed)
Bed Control contacted at [phone] option #2.  Reg medical bed requested for hypokalemia (observation).  Bed control will contact pt's son [name] @ [phone] when room available, but will contact pt at number in chart if they are unable to contact son, pt aware.Despina Hidden Cassady9/24/20185:01 PM

## 2017-06-05 NOTE — Telephone Encounter (Addendum)
I spoke with [name]; she reports that since her visit here last Friday her symptoms seem to have worsened. She has worsening nausea and dry heaving, tremors, numbness and tingling in her extremities and face. She has not been able to take in too much PO due to the nausea and reports no sleep in the last 2 days due to discomfort and symptoms. She attributes this to starting clonazepam 0.5mg  BID, however this is unlikely. She was hypokalemic to 3.1 and hypomagnesemic to 1.4 which I'm concerned are contributing to her symptoms and she is failing oral repletion. Since our clinic would be closed by time patient able to arrive, patient hesitant about going to ED, however has agreed to direct admission. I spoke to her son, [name] at her request and he is in agreement with this.   Etiology could be related to her chlorthalidone. Her dose was increased in Feb, prior to which her K was wnl. From conversation last week, she did not have apparent GI losses. If no other cause of hypoK and hypoMg, she may need urine studies.  Bed request has been made and patient aware she and son will be contacted when bed is available. Admitting team aware.  On admission, she should get Bmet and Magnesium rechecked, and repleted appropriately. I anticipate she will need IV repletion.   Alphonzo Grieve, MD IMTS - PGY2 Pager [phone]

## 2017-06-06 ENCOUNTER — Encounter (HOSPITAL_COMMUNITY): Payer: Self-pay

## 2017-06-06 ENCOUNTER — Inpatient Hospital Stay (HOSPITAL_COMMUNITY): Payer: Medicare Other

## 2017-06-06 DIAGNOSIS — Z885 Allergy status to narcotic agent status: Secondary | ICD-10-CM | POA: Diagnosis not present

## 2017-06-06 DIAGNOSIS — R200 Anesthesia of skin: Secondary | ICD-10-CM | POA: Diagnosis present

## 2017-06-06 DIAGNOSIS — Z853 Personal history of malignant neoplasm of breast: Secondary | ICD-10-CM | POA: Diagnosis not present

## 2017-06-06 DIAGNOSIS — R251 Tremor, unspecified: Secondary | ICD-10-CM | POA: Diagnosis present

## 2017-06-06 DIAGNOSIS — E785 Hyperlipidemia, unspecified: Secondary | ICD-10-CM | POA: Diagnosis present

## 2017-06-06 DIAGNOSIS — Z882 Allergy status to sulfonamides status: Secondary | ICD-10-CM | POA: Diagnosis not present

## 2017-06-06 DIAGNOSIS — Z87891 Personal history of nicotine dependence: Secondary | ICD-10-CM | POA: Diagnosis not present

## 2017-06-06 DIAGNOSIS — Z8249 Family history of ischemic heart disease and other diseases of the circulatory system: Secondary | ICD-10-CM | POA: Diagnosis not present

## 2017-06-06 DIAGNOSIS — F410 Panic disorder [episodic paroxysmal anxiety] without agoraphobia: Secondary | ICD-10-CM | POA: Diagnosis present

## 2017-06-06 DIAGNOSIS — J45909 Unspecified asthma, uncomplicated: Secondary | ICD-10-CM | POA: Diagnosis present

## 2017-06-06 DIAGNOSIS — F329 Major depressive disorder, single episode, unspecified: Secondary | ICD-10-CM | POA: Diagnosis present

## 2017-06-06 DIAGNOSIS — I2510 Atherosclerotic heart disease of native coronary artery without angina pectoris: Secondary | ICD-10-CM | POA: Diagnosis present

## 2017-06-06 DIAGNOSIS — R202 Paresthesia of skin: Secondary | ICD-10-CM | POA: Diagnosis present

## 2017-06-06 DIAGNOSIS — E876 Hypokalemia: Principal | ICD-10-CM

## 2017-06-06 DIAGNOSIS — Z880 Allergy status to penicillin: Secondary | ICD-10-CM | POA: Diagnosis not present

## 2017-06-06 DIAGNOSIS — I10 Essential (primary) hypertension: Secondary | ICD-10-CM | POA: Diagnosis present

## 2017-06-06 DIAGNOSIS — Z91040 Latex allergy status: Secondary | ICD-10-CM | POA: Diagnosis not present

## 2017-06-06 DIAGNOSIS — F411 Generalized anxiety disorder: Secondary | ICD-10-CM | POA: Diagnosis present

## 2017-06-06 DIAGNOSIS — R51 Headache: Secondary | ICD-10-CM | POA: Diagnosis not present

## 2017-06-06 DIAGNOSIS — Z888 Allergy status to other drugs, medicaments and biological substances status: Secondary | ICD-10-CM | POA: Diagnosis not present

## 2017-06-06 DIAGNOSIS — K219 Gastro-esophageal reflux disease without esophagitis: Secondary | ICD-10-CM | POA: Diagnosis present

## 2017-06-06 LAB — BASIC METABOLIC PANEL
ANION GAP: 9 (ref 5–15)
Anion gap: 8 (ref 5–15)
BUN: 13 mg/dL (ref 6–20)
BUN: 12 mg/dL (ref 6–20)
CALCIUM: 9.5 mg/dL (ref 8.9–10.3)
CHLORIDE: 104 mmol/L (ref 101–111)
CO2: 25 mmol/L (ref 22–32)
CO2: 24 mmol/L (ref 22–32)
CREATININE: 0.89 mg/dL (ref 0.44–1.00)
Calcium: 9.4 mg/dL (ref 8.9–10.3)
Chloride: 106 mmol/L (ref 101–111)
Creatinine, Ser: 1.01 mg/dL — ABNORMAL HIGH (ref 0.44–1.00)
GFR calc Af Amer: >60 mL/min (ref >60)
GFR calc non Af Amer: 58 mL/min — ABNORMAL LOW (ref >60)
GLUCOSE: 140 mg/dL — AB (ref 65–99)
Glucose, Bld: 149 mg/dL — ABNORMAL HIGH (ref 65–99)
POTASSIUM: 3.5 mmol/L (ref 3.5–5.1)
Potassium: 3.1 mmol/L — ABNORMAL LOW (ref 3.5–5.1)
SODIUM: 138 mmol/L (ref 135–145)
Sodium: 138 mmol/L (ref 135–145)

## 2017-06-06 LAB — MAGNESIUM: MAGNESIUM: 2.1 mg/dL (ref 1.7–2.4)

## 2017-06-06 LAB — HIV ANTIBODY (ROUTINE TESTING W REFLEX): HIV Screen 4th Generation wRfx: NONREACTIVE

## 2017-06-06 MED ORDER — ENSURE ENLIVE PO LIQD
237.0000 mL | Freq: Two times a day (BID) | ORAL | Status: DC
  Administered 2017-06-07: 237 mL via ORAL

## 2017-06-06 MED ORDER — POTASSIUM CHLORIDE CRYS ER 20 MEQ PO TBCR
40.0000 meq | EXTENDED_RELEASE_TABLET | Freq: Two times a day (BID) | ORAL | Status: DC
  Administered 2017-06-06 – 2017-06-07 (×3): 40 meq via ORAL
  Filled 2017-06-06 (×3): qty 2

## 2017-06-06 MED ORDER — ENSURE ENLIVE PO LIQD
237.0000 mL | Freq: Three times a day (TID) | ORAL | Status: DC
Start: 2017-06-06 — End: 2017-06-06

## 2017-06-06 MED ORDER — POTASSIUM CHLORIDE CRYS ER 10 MEQ PO TBCR: 10.0000 meq | EXTENDED_RELEASE_TABLET | Freq: Every day | ORAL | 0 refills | Status: DC

## 2017-06-06 MED ORDER — PROMETHAZINE HCL 25 MG/ML IJ SOLN
6.2500 mg | Freq: Three times a day (TID) | INTRAMUSCULAR | Status: DC | PRN
  Administered 2017-06-06 – 2017-06-07 (×2): 6.25 mg via INTRAVENOUS
  Filled 2017-06-06 (×2): qty 1

## 2017-06-06 MED ORDER — ONDANSETRON HCL 4 MG PO TABS
4.0000 mg | ORAL_TABLET | Freq: Once | ORAL | Status: AC
  Administered 2017-06-06: 4 mg via ORAL
  Filled 2017-06-06: qty 1

## 2017-06-06 MED ORDER — DIAZEPAM 5 MG PO TABS
5.0000 mg | ORAL_TABLET | Freq: Two times a day (BID) | ORAL | Status: DC
  Administered 2017-06-06 (×2): 5 mg via ORAL
  Filled 2017-06-06 (×3): qty 1

## 2017-06-06 MED ORDER — POTASSIUM CHLORIDE CRYS ER 20 MEQ PO TBCR
40.0000 meq | EXTENDED_RELEASE_TABLET | Freq: Once | ORAL | Status: AC
Start: 2017-06-06 — End: 2017-06-06
  Administered 2017-06-06: 40 meq via ORAL
  Filled 2017-06-06: qty 2

## 2017-06-06 NOTE — Progress Notes (Signed)
Patient becomes nauseous with the smell of food. PRN Phenergan does not seem to be working well. MD notified. No return pages. Will continue monitor.

## 2017-06-06 NOTE — Care Management Note (Signed)
Case Management Note  Patient Details  Name: [name] MRN: [id number] Date of Birth: [birth year]  Subjective/Objective:       Tremors, HTN              Action/Plan: Discharge Planning: NCM spoke to pt at bedside. States she lives with her son and dtil. She was independent prior to hospital stay. Still drives to appts. No DME at home. Will continue to follow for dc needs.   PCP Ina Homes MD  Expected Discharge Date:                 Expected Discharge Plan:  Home/Self Care  In-House Referral:  NA  Discharge planning Services  CM Consult  Post Acute Care Choice:  NA Choice offered to:  NA  DME Arranged:  N/A DME Agency:  NA  HH Arranged:  NA HH Agency:  NA  Status of Service:  In process, will continue to follow  If discussed at Long Length of Stay Meetings, dates discussed:    Additional Comments:  Erenest Rasher, RN 06/06/2017, 10:33 AM

## 2017-06-06 NOTE — Care Management Obs Status (Signed)
[facility] NOTIFICATION   Patient Details  Name: [name] MRN: [id number] Date of Birth: June 05, 1951   Medicare Observation Status Notification Given:  Yes    Erenest Rasher, RN 06/06/2017, 10:29 AM
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
## 2017-06-06 NOTE — Progress Notes (Signed)
Initial Nutrition Assessment  DOCUMENTATION CODES:   Obesity unspecified  INTERVENTION:   - Ensure BID provides 350 kcals and 20 grams of protein per serving  -Recommend pt eat bland/soft foods while n/v present; discussed with pt   NUTRITION DIAGNOSIS:   Inadequate oral intake related to vomiting, poor appetite, other (see comment) (nausea, dry heaving, diarrhea) as evidenced by per patient/family report, meal completion < 25%.  GOAL:   Patient will meet greater than or equal to 90% of their needs  MONITOR:   PO intake, Supplement acceptance, Weight trends, Labs  REASON FOR ASSESSMENT:   Malnutrition Screening Tool    ASSESSMENT:   66 year old female with hypokalemia. PMH of HTN, GAD, MDD, asthma, and tremors.  Pt reports vomiting or dry heaving after most meals in the past few days. Pt has been eating 1 to 2 meals per day. Items such as egg sandwiches, chicken, oatmeal, and Sprite. Per pt she has been "forcing herself" to eat the food and has had low appetite and PO intake. Per chart pt had increased anxiety during [facility] and lost her significant other 8 months ago.   Pt reports having frequent diarrhea before moving in with son and occasionally observing blood in the stool. Pt reports a sore abdomen, particularly right side. Pt states she has lost 9.5 lbs since August 2018. Per chart pt weight has been stable since August and has fluctuated very little since 10/2016.    Medications: Lovenox, Protonix  Labs: Glucose 140 (H), K WDL  Nutrition Focused Physical Exam Findings: Normal   Diet Order:  Diet Heart Room service appropriate? Yes; Fluid consistency: Thin  Skin:  Reviewed, no issues  Last BM:  06/05/17  Height:   Ht Readings from Last 1 Encounters:  06/06/17 5\' 3"  (1.6 m)    Weight:   Wt Readings from Last 1 Encounters:  06/05/17 175 lb 14.8 oz (79.8 kg)    Ideal Body Weight:  52.3 kg  BMI:  Body mass index is 31.16 kg/m.  Estimated  Nutritional Needs:   Kcal:  1800-2000 kcal  Protein:  80-95 grams of protein  Fluid:  >/= 1.8 L  EDUCATION NEEDS:   No education needs identified at this time  [place] Intern Pager: [phone] 06/06/2017 4:16 PM

## 2017-06-07 ENCOUNTER — Telehealth: Payer: Self-pay

## 2017-06-07 ENCOUNTER — Ambulatory Visit: Payer: Medicare Other | Admitting: Internal Medicine

## 2017-06-07 LAB — BASIC METABOLIC PANEL
Anion gap: 9 (ref 5–15)
BUN: 12 mg/dL (ref 6–20)
CALCIUM: 9.5 mg/dL (ref 8.9–10.3)
CHLORIDE: 105 mmol/L (ref 101–111)
CO2: 25 mmol/L (ref 22–32)
CREATININE: 0.86 mg/dL (ref 0.44–1.00)
GFR calc Af Amer: >60 mL/min (ref >60)
GFR calc non Af Amer: >60 mL/min (ref >60)
GLUCOSE: 130 mg/dL — AB (ref 65–99)
Potassium: 3.6 mmol/L (ref 3.5–5.1)
Sodium: 139 mmol/L (ref 135–145)

## 2017-06-07 MED ORDER — ALPRAZOLAM 0.5 MG PO TABS: 0.5000 mg | ORAL_TABLET | Freq: Two times a day (BID) | ORAL | 0 refills | Status: DC | PRN

## 2017-06-07 MED ORDER — DIPHENHYDRAMINE HCL 25 MG PO CAPS
25.0000 mg | ORAL_CAPSULE | Freq: Once | ORAL | Status: AC
  Administered 2017-06-07: 25 mg via ORAL
  Filled 2017-06-07 (×2): qty 1

## 2017-06-07 MED ORDER — ALPRAZOLAM 0.5 MG PO TABS
0.5000 mg | ORAL_TABLET | Freq: Once | ORAL | Status: AC
  Administered 2017-06-07: 0.5 mg via ORAL
  Filled 2017-06-07: qty 1

## 2017-06-07 NOTE — Discharge Instructions (Signed)
Please follow-up with the Internal medicine clinic on October 3rd.   Please continue to follow with your PCP, Dr. Tarri Abernethy with regard to your anxiety and depression. As he previously advised, please follow-up with a psychiatrist on an outpatient basis.   Feel free to call the IMTS clinic with any questions or concerns.   Thank you for visiting the [facility].

## 2017-06-07 NOTE — Discharge Summary (Signed)
Name: [name] MRN: [id number] DOB: 18-Dec-1950 66 y.o. PCP: Ina Homes, MD  Date of Admission: 06/05/2017  6:03 PM Date of Discharge: 06/07/2017 Attending Physician: Sid Falcon, MD  Discharge Diagnosis: Principal Problem:   Hypokalemia Active Problems:   Generalized anxiety disorder   Hypomagnesemia   Discharge Medications: Allergies as of 06/07/2017      Reactions   Amoxicillin Anaphylaxis   Throat Swells   Azithromycin Anaphylaxis   Throat Swelling   Bromfed Anaphylaxis   Throat Swelling   Cephalexin Anaphylaxis   Throat Swelling   Chlordiazepoxide-clidinium Anaphylaxis   Throat Swelling   Claritin [loratadine] Anaphylaxis   Clotrimazole Swelling, Other (See Comments), Hypertension   Patient told me that she couldn't swallow due to the medication   Gabapentin Other (See Comments)   Nausea, weakness, lost movement and feeling in both legs (HAD TO CALL EMS)   Gatifloxacin Shortness Of Breath, Other (See Comments)   Caused bad chest congestion and caused a severe asthma attack   Ibuprofen Anaphylaxis   Iohexol Anaphylaxis, Shortness Of Breath, Swelling, Other (See Comments)    Code: HIVES, Desc: throat swelling no hives 20 yrs ago;needs pre-medication  09/19/07 sg, Onset Date: [id number]   Lidocaine Hives, Hypertension   REQUIRED A TRIP TO [place]   Paroxetine Anaphylaxis   Throat Swelling   Prednisone Anaphylaxis, Swelling   Throat swelling Throat swelling   Pregabalin Anxiety, Anaphylaxis   nervousness   Propoxyphene N-acetaminophen Anaphylaxis   REACTION: swelling in the throat   Sertraline Hcl Anaphylaxis   Throat Swelling   Sulfa Antibiotics Anaphylaxis   Sulfadiazine Anaphylaxis   Throat Swelling   Verapamil Anaphylaxis   Throat Swelling   Adhesive [tape] Other (See Comments)   blisters   Effexor [venlafaxine] Hives, Itching   Ibuprofen Nausea And Vomiting   Latex Swelling   Blisters on Skin   Lisinopril Other (See Comments)   ANGIOEDEMA   Tussionex Pennkinetic Er [hydrocod Polst-cpm Polst Er] Itching, Photosensitivity   "Sunburn"   Clonazepam Nausea Only, Other (See Comments)   numbness, weakness in her arms, legs and increased tremors   Escitalopram Other (See Comments)   LEXAPRO== Nausea, numb, tingly   Codeine Rash   Dicyclomine Hcl Hives   Hydralazine Hcl Itching, Rash   Penicillins Hives   Has patient had a PCN reaction causing immediate rash, facial/tongue/throat swelling, SOB or lightheadedness with hypotension: Yes Has patient had a PCN reaction causing severe rash involving mucus membranes or skin necrosis: No Has patient had a PCN reaction that required hospitalization No Has patient had a PCN reaction occurring within the last 10 years: No If all of the above answers are "NO", then may proceed with Cephalosporin use.   Pseudoephedrine Hives, Itching, Rash      Medication List    STOP taking these medications   azelastine 0.1 % nasal spray Commonly known as:  ASTELIN   chlorthalidone 50 MG tablet Commonly known as:  HYGROTON     TAKE these medications   acetaminophen 500 MG tablet Commonly known as:  TYLENOL Take 500 mg by mouth every 6 (six) hours as needed for moderate pain.   albuterol 108 (90 Base) MCG/ACT inhaler Commonly known as:  PROAIR HFA INHALE 2 PUFFS BY MOUTH IN TO THE LUNGS EVERY 4 HOURS AS NEEDED FOR WHEEZING OR SHORTNESS OF BREATH   ALPRAZolam 0.5 MG tablet Commonly known as:  XANAX Take 1 tablet (0.5 mg total) by mouth 2 (two) times daily as needed  for anxiety or sleep.   ascorbic acid 1000 MG tablet Commonly known as:  VITAMIN C Take 500 mg by mouth daily.   aspirin EC 81 MG tablet Take 81 mg by mouth daily.   BIOTIN PO Take 1 tablet by mouth every morning.   BREO ELLIPTA 100-25 MCG/INH Aepb Generic drug:  fluticasone furoate-vilanterol Inhale 1 puff into the lungs daily.   CALCIUM-CARB 600 + D 600-125 MG-UNIT Tabs Generic drug:  Calcium  Carbonate-Vitamin D Take 1 tablet by mouth daily.   cetirizine 10 MG tablet Commonly known as:  ZYRTEC Take 1 tablet (10 mg total) by mouth daily.   cycloSPORINE 0.05 % ophthalmic emulsion Commonly known as:  RESTASIS Place 1 drop into both eyes 2 (two) times daily.   fluticasone 50 MCG/ACT nasal spray Commonly known as:  FLONASE Place 1-2 sprays into both nostrils daily. What changed:  when to take this  reasons to take this   losartan 100 MG tablet Commonly known as:  COZAAR Take 1 tablet (100 mg total) by mouth daily.   magnesium chloride 64 MG Tbec SR tablet Commonly known as:  SLOW-MAG Take 2 tablets (128 mg total) by mouth daily.   metoprolol tartrate 25 MG tablet Commonly known as:  LOPRESSOR Take 1 tablet (25 mg total) by mouth 2 (two) times daily.   nitroGLYCERIN 0.4 MG SL tablet Commonly known as:  NITROSTAT Place 0.4 mg under the tongue every 5 (five) minutes as needed for chest pain.   pantoprazole 40 MG tablet Commonly known as:  PROTONIX Take 1 tablet (40 mg total) by mouth 2 (two) times daily before a meal.   potassium chloride 10 MEQ tablet Commonly known as:  K-DUR,KLOR-CON Take 1 tablet (10 mEq total) by mouth daily. What changed:  how much to take   pravastatin 40 MG tablet Commonly known as:  PRAVACHOL Take 40 mg by mouth in the evening.   vitamin E 400 UNIT capsule Generic drug:  vitamin E Take 400 Units by mouth daily.   VOLTAREN 1 % Gel Generic drug:  diclofenac sodium Apply 2 g topically 2 (two) times daily. 1% GEL            Discharge Care Instructions        Start     Ordered   06/07/17 0000  Increase activity slowly     06/07/17 1025   06/07/17 0000  Diet - low sodium heart healthy     06/07/17 1025   06/07/17 0000  ALPRAZolam (XANAX) 0.5 MG tablet  2 times daily PRN     06/07/17 1025      Disposition and follow-up:   [name] was discharged from [facility] in Stable condition.  At the  hospital follow up visit please address:  1.  A. Severe anxiety disorder on Xanax BID PRN      B. Electrolyte abnormalities, hypokalemia and hypomagnesemia, most likely secondary to chlorthalidone      C. Please assist patient with acquiring an outpatient psychiatric evaluation if amenable   2.  Labs / imaging needed at time of follow-up: BMP and magnesium   3.  Pending labs/ test needing follow-up: n/a  Follow-up Appointments:   Hospital Course by problem list: Principal Problem:   Hypokalemia Active Problems:   Generalized anxiety disorder   Hypomagnesemia   1. 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  Her exam was consistent over multiple evaluations wherein the patient stated that the left half of her body experienced decreased sensation when compared to the right. The patient's strength was intact 5/5 bilaterally upper and lower proximal and distal, with reflexes difficult to evaluate as the patient would not relax or proximal or distal musculature upon request. Multiple attempts were made at eliciting reflexes, but eventually were aborted as the patient refused to relax appropriately even with distraction. Given the consistency of the patient's hemiparesthesia, MRI was eventually performed of the brain without contrast. The MRI was negative for acute or chronic intracranial process consistent only with changes appropriate for the patient's age(see impression below). As such, the patient was discharged home on her Xanax 0.5mg  2 times daily dosing for control for anxiety area. She preferred this over clonazepam and diazepam as with all the medications these cause an unconfirmed severe allergic reaction. The patient was strongly advised to follow up with her outpatient neurology appointment, her visit with her primary care clinic,  and strongly advised to scheduled with psychiatry for further evaluation and treatment of her chronic underlying anxiety and depression.   2. Hypokalemia and hypomagnesemia  The patient was noted to be Hypokalemic with associated hypomagnesemia in the clinic. She was placed on by mouth magnesium and potassium supplements which she did not obtain will proceed with treatment.Following admission potassium was noted to be 2.47mmol/L with a magnesium of 1.4mg /dL. The patient was given 2 g of magnesium sulfate, 10 mEq IV potassium chloride and 80 mEq by mouth potassium chloride. Following day she was given an additional 40 mEq by mouth potassium for potassium 3.6 by the day of discharge. She was advised to continue taking her by mouth potassium as an outpatient, and to discontinue the chlorthalidone as this was considered the most likely cause of her hypokalemia. The patient was advised to follow-up in the clinic within 1 week for evaluation of her blood pressure consideration the need for additional medications.  3. Essential Hypertension The patient's hypertension was well-controlled during this visit metoprolol tartrate 25 mg twice a day. Due to initial hypotension the losartan was held. The patient was advised to continue taking her losartan and metoprolol on discharge, to which she agreed. In addition she was advised to follow up with the primary care provider or his clinic for evaluation of her blood pressure within 1 week.   Discharge Vitals:   BP 136/79 (BP Location: Left Arm)   Pulse 88   Temp 98.4 F (36.9 C) (Oral)   Resp 18   Ht 5\' 3"  (1.6 m)   Wt 175 lb 11.3 oz (79.7 kg)   SpO2 96%   BMI 31.13 kg/m   Pertinent Labs, Studies, and Procedures:  BMP Latest Ref Rng & Units 06/07/2017 06/06/2017 06/06/2017  Glucose 65 - 99 mg/dL 130(H) 140(H) 149(H)  BUN 6 - 20 mg/dL 12 12 13   Creatinine 0.44 - 1.00 mg/dL 0.86 0.89 1.01(H)  BUN/Creat Ratio 12 - 28 - - -  Sodium 135 - 145 mmol/L 139 138 138    Potassium 3.5 - 5.1 mmol/L 3.6 3.5 3.1(L)  Chloride 101 - 111 mmol/L 105 106 104  CO2 22 - 32 mmol/L 25 24 25   Calcium 8.9 - 10.3 mg/dL 9.5 9.5 9.4   CBC Latest Ref Rng & Units 06/05/2017 05/31/2017 05/29/2017  WBC 4.0 - 10.5 K/uL 6.3 7.0 5.6  Hemoglobin 12.0 - 15.0 g/dL 12.5 12.7 13.0  Hematocrit 36.0 - 46.0 % 36.9 38.0 37.8  Platelets 150 - 400 K/uL 233 243 217   MRI BRAIN WO CONTRAST: IMPRESSION: 1. No acute intracranial abnormality. 2. Stable mild for age chronic microvascular ischemic changes and mild parenchymal volume loss of the brain.  Discharge Instructions: Discharge Instructions    Diet - low sodium heart healthy    Complete by:  As directed    Increase activity slowly    Complete by:  As directed       Signed: Kathi Ludwig, MD 06/07/2017, 10:45 AM   Pager: Pager# [phone]

## 2017-06-07 NOTE — Progress Notes (Signed)
  Date: 06/07/2017  Patient name: [name]  Medical record number: [id number]  Date of birth: 1951/02/05   I have seen and evaluated this patient and I have discussed the plan of care with the house staff. Please see Dr. Nelma Rothman note for complete details. I concur with his findings with the following additions/corrections:   Given her allergies to multiple medications and attempt at both clonazepam and diazepam were not successful, will start back on alprazolam today.  She certainly needs psychiatry follow up for anxiety and neurology follow up for tremor.  Discharge today.   Sid Falcon, MD 06/07/2017, 2:30 PM

## 2017-06-07 NOTE — Progress Notes (Signed)
   Subjective: Upon entering the room today the patient stated that she had a difficult night. She attests the suffering from a possible reaction from the Valium that she was given previous day. During her MRI the previous day complained of throat tightening, SOB, itching and chest redness for which she was given benadryl. She was to be in no acute distress, without rash, and monitored without increasing severity of her symptoms. Patient stated today that she had a reaction to Valium and was to be placed back on Xanax. When informed that class up and better option, she stated that she also experienced a reaction to clonazepam, despite her previous statement the day before indicating that clonazepam had worked very well to control her symptoms. She appears a denied reaction to clonazepam. The patient was informed that the clonazepam was the drug of choice given her severe anxiety and the length of action that clonazepam offers. In addition she questions how to treat her current symptoms given that her MRI failed to demonstrate intracranial pathology. She was informed that the previously recommended psychiatric evaluation was preferred for ongoing evaluation and treatment.    Objective:  Vital signs in last 24 hours: Vitals:   06/06/17 1015 06/06/17 2015 06/07/17 0053 06/07/17 0502  BP: 112/70 133/76 (!) 146/82 (!) 142/71  Pulse: 85 90 88 84  Resp:  18 20 19   Temp:  98.4 F (36.9 C)  98.8 F (37.1 C)  TempSrc:  Oral  Oral  SpO2:  98% 97% 98%  Weight:  175 lb 11.3 oz (79.7 kg)    Height:       ROS negative except as per history of present illness Physical Exam Constitutional: Well-developed, well-nourished, no acute distress Cardiovascular regular rate and rhythm without murmur auscultated Pulmonary: Normal effort, normal breath sounds, no wheezing no stridor, not in acute respiratory distress Abdominal: Abdomen soft, nontender, nondistended Skeletal skeletal: Mild less than +1 pitting edema of  the ankles, with associated tenderness Neurological: 5/5 strength upper and lower bilaterally, sensation remained as the prior day with subjective left sided hemiparesthesia that the patient dose not believe had improved Psychiatric:Patient is anxious, expressing concern for worsening anxiety  Assessment/Plan:  Principal Problem:   Hypokalemia Active Problems:   Generalized anxiety disorder   Hypomagnesemia  Major depressive disorder with panic attacks: -MRI brain ordered for focal deficit of the left, NORMAL EXAM -Alprazolam 0.5 mg BID instead of clonazepam   Hypokalemia/hypomagnesimia: Magnesium replaced, rechecked and within normal limits  K+ 3.6 this am Patient presented with a known history of hyperkalemia  Replaced with PO  Hyperlipidemia: Contine pravastatin 40mg  ad  GERD: Continue pantoprazole 40 qd  Diet: heart health Fluids: none Code: Full Dvt ppx: enoxaparin  Dispo: Anticipated discharge in approximately today.   Kathi Ludwig, MD 06/07/2017, 7:21 AM Pager: Pager# [phone]

## 2017-06-07 NOTE — Consult Note (Signed)
           [facility] CM Primary Care Navigator  06/07/2017  [name] 03/21/1951 [id number]    Attempt to seepatient at the bedsideto identify possible discharge needs but she was already discharged per staff report.  Patient was discharged home today.  Primary care provider's officecalled (spoke with Hme) to notify of patient's discharge, need for post hospital follow-up and transition of care.  Made aware to refer patient to [facility] care management if deemed necessary and appropriate for services.   For questions, please contact:  Dannielle Huh, BSN, RN- [facility] Primary Care Navigator  Telephone: [phone] [place]

## 2017-06-07 NOTE — Progress Notes (Signed)
Received call from MRI that patient is complaining of SOB, itching and chest has some redness,patient claims she might be allergic to valium that she got earlier at 21:30. Patient is on her way back to unit. MD on call notified of patient's complaint. Will page MD again when patient is back in her room.00:55 Patient back in her room. Patient verbalized feeling short of breath and itching on neck area and face. No rashes noted VSS. No acute distress noted. MD to order benadryl. Will continue to monitor. Kenyon Eshleman, Wonda Cheng, Therapist, sports

## 2017-06-07 NOTE — Telephone Encounter (Signed)
Hospital TOC per dr Berline Lopes, discharge 06/07/2017, appt 06/14/2017.

## 2017-06-09 ENCOUNTER — Ambulatory Visit: Payer: Medicare Other | Admitting: Hematology and Oncology

## 2017-06-13 ENCOUNTER — Ambulatory Visit
Admission: RE | Admit: 2017-06-13 | Discharge: 2017-06-13 | Disposition: A | Discharge status: Home / Self Care | Payer: Medicare Other | Source: Ambulatory Visit | Attending: Adult Health | Admitting: Adult Health

## 2017-06-13 DIAGNOSIS — Z1231 Encounter for screening mammogram for malignant neoplasm of breast: Secondary | ICD-10-CM

## 2017-06-14 ENCOUNTER — Other Ambulatory Visit: Payer: Self-pay | Admitting: Internal Medicine

## 2017-06-14 ENCOUNTER — Encounter: Payer: Self-pay | Admitting: Internal Medicine

## 2017-06-14 ENCOUNTER — Ambulatory Visit (INDEPENDENT_AMBULATORY_CARE_PROVIDER_SITE_OTHER): Payer: Medicare Other | Admitting: Internal Medicine

## 2017-06-14 VITALS — BP 155/82 | HR 83 | Temp 99.0°F | Wt 176.0 lb

## 2017-06-14 DIAGNOSIS — I2510 Atherosclerotic heart disease of native coronary artery without angina pectoris: Secondary | ICD-10-CM | POA: Diagnosis not present

## 2017-06-14 DIAGNOSIS — I10 Essential (primary) hypertension: Secondary | ICD-10-CM

## 2017-06-14 DIAGNOSIS — F411 Generalized anxiety disorder: Secondary | ICD-10-CM | POA: Diagnosis not present

## 2017-06-14 DIAGNOSIS — Z79899 Other long term (current) drug therapy: Secondary | ICD-10-CM | POA: Diagnosis not present

## 2017-06-14 DIAGNOSIS — E876 Hypokalemia: Secondary | ICD-10-CM | POA: Diagnosis not present

## 2017-06-14 DIAGNOSIS — R7303 Prediabetes: Secondary | ICD-10-CM

## 2017-06-14 DIAGNOSIS — Z0000 Encounter for general adult medical examination without abnormal findings: Secondary | ICD-10-CM

## 2017-06-14 MED ORDER — POTASSIUM CHLORIDE CRYS ER 10 MEQ PO TBCR: 10.0000 meq | EXTENDED_RELEASE_TABLET | Freq: Every day | ORAL | 0 refills | Status: DC

## 2017-06-14 MED ORDER — PRAVASTATIN SODIUM 40 MG PO TABS: ORAL_TABLET | ORAL | 0 refills | Status: DC

## 2017-06-14 MED ORDER — METOPROLOL TARTRATE 50 MG PO TABS: 50.0000 mg | ORAL_TABLET | Freq: Two times a day (BID) | ORAL | 1 refills | Status: DC
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
## 2017-06-14 NOTE — Assessment & Plan Note (Addendum)
Denies any tremors or shaking today. Is taking Kdur, but not Mag. Has stopped chlorthalidone but continues losartan and metoprolol.  Plan -repeat BMET and Mag.  Addendum: 10/3 -K within normal limits, but mag is slightly low- called the pt and she said she was prescribed slow-mag but does not have it- I re-sent it to pharmacy- asked her to come back next week to re-check mag

## 2017-06-14 NOTE — Assessment & Plan Note (Signed)
She is up to date on flu vaccine per pt report

## 2017-06-14 NOTE — Progress Notes (Signed)
Internal Medicine Clinic Attending  Case discussed with Dr. Saraiya at the time of the visit.  We reviewed the resident's history and exam and pertinent patient test results.  I agree with the assessment, diagnosis, and plan of care documented in the resident's note.  

## 2017-06-14 NOTE — Assessment & Plan Note (Signed)
BP Readings from Last 3 Encounters:  06/14/17 (!) 155/82  06/07/17 136/79  06/02/17 (!) 135/58   Pt continues to be hypertensive- chlorthalidone was stopped due to hypokalemia. She is on losartan and metoprolol and is compliant with it  Plan -increase metoprolol to 50 mg BID -continue losartan -follow up in 1 month to re-assess her hypertension

## 2017-06-14 NOTE — Patient Instructions (Addendum)
Thank you for your visit today  Please take metoprolol 50 mg twice a day for your blood pressure (you are currently taking 25 mg twice a day)- so we have increased your dose  Please follow up with Dr Adele Schilder on Nov 9th at your scheduled appointment- please do not miss that appointment as it will be very difficult to reschedule that appointment   I will call you about yo ur labwork   Please come back in 1 month here

## 2017-06-14 NOTE — Assessment & Plan Note (Signed)
Elevated CBGs noted on BMETs- Given that she is goiing through everything right now, we will defer testing for a1c now, and obtain this at next visit when things are more settled.  Plan -check a1c at next visit

## 2017-06-14 NOTE — Assessment & Plan Note (Addendum)
Patient follows up for her GAD and depressive features after being hospitalized for electrolyte abnormalities. Also underwent neuro consultation and MRI negative. Says that she has now moved to live with the son and daughter in law, but has lot of problems with the daughter in law. Was tearful in the room today for almost 40 minutes and explaining the different situations. Says she feels safe at home currently. Has lost her job at [employer] due to her anxiety. Was upset at ER providers.  She denies any tremors today GAD score of 13, and PHQ-9 of 8.  Has appt with Psyc Dr Adele Schilder on Nov 9th. Had appt with [facility] psychologist but she cancelled It.   Plan -we will continue xanax BID- she has enough medicines as it was recently renewed -she will follow up with psychiatry Nov 9th. I advised her not to miss the appt -encouraged her to follow up with psychologist Ms Ouida Sills who she has good rapport with.

## 2017-06-15 ENCOUNTER — Ambulatory Visit: Payer: Self-pay | Admitting: Psychiatry

## 2017-06-15 ENCOUNTER — Ambulatory Visit (HOSPITAL_BASED_OUTPATIENT_CLINIC_OR_DEPARTMENT_OTHER): Payer: Medicare Other | Admitting: Hematology and Oncology

## 2017-06-15 ENCOUNTER — Telehealth: Payer: Self-pay

## 2017-06-15 ENCOUNTER — Ambulatory Visit: Payer: Medicare Other | Admitting: Psychiatry

## 2017-06-15 VITALS — BP 157/80 | HR 80 | Temp 97.7°F | Resp 18 | Ht 63.0 in | Wt 176.9 lb

## 2017-06-15 DIAGNOSIS — C50211 Malignant neoplasm of upper-inner quadrant of right female breast: Secondary | ICD-10-CM

## 2017-06-15 DIAGNOSIS — F419 Anxiety disorder, unspecified: Secondary | ICD-10-CM

## 2017-06-15 DIAGNOSIS — Z170 Estrogen receptor positive status [ER+]: Secondary | ICD-10-CM

## 2017-06-15 LAB — BMP8+ANION GAP
ANION GAP: 16 mmol/L (ref 10.0–18.0)
BUN / CREAT RATIO: 13 (ref 12–28)
BUN: 10 mg/dL (ref 8–27)
CO2: 25 mmol/L (ref 20–29)
CREATININE: 0.75 mg/dL (ref 0.57–1.00)
Calcium: 10 mg/dL (ref 8.7–10.3)
Chloride: 102 mmol/L (ref 96–106)
GFR, EST AFRICAN AMERICAN: 97 mL/min/{1.73_m2} (ref >59)
GFR, EST NON AFRICAN AMERICAN: 85 mL/min/{1.73_m2} (ref >59)
Glucose: 142 mg/dL — ABNORMAL HIGH (ref 65–99)
POTASSIUM: 3.8 mmol/L (ref 3.5–5.2)
SODIUM: 143 mmol/L (ref 134–144)

## 2017-06-15 LAB — MAGNESIUM: MAGNESIUM: 1.5 mg/dL — AB (ref 1.6–2.3)

## 2017-06-15 MED ORDER — MAGNESIUM CHLORIDE 64 MG PO TBEC: 2.0000 | DELAYED_RELEASE_TABLET | Freq: Every day | ORAL | 0 refills | Status: DC

## 2017-06-15 NOTE — Addendum Note (Signed)
Addended by: Burgess Estelle A on: 06/15/2017 08:45 AM   Modules accepted: Orders

## 2017-06-15 NOTE — Assessment & Plan Note (Signed)
Right breast cancer diagnosed 08/29/2007 T2, N0, M0 stage II A. pathologic staging after neoadjuvant chemotherapy completed 03/03/2008 for ER/PR negative HER-2 negative breast cancer treated with lumpectomy 05/06/2008 followed by radiation which was completed 07/22/2008  Breast Cancer Surveillance: 1. Breast exam 06/15/2017: Normal, and redness near the radiation area 2. Mammogram 06/14/2017 No abnormalities. Postsurgical changes. Breast Density Category C.   Return to clinic in 1 to follow-up with survivorship clinic

## 2017-06-15 NOTE — Progress Notes (Signed)
Patient Care Team: Ina Homes, MD as PCP - General  DIAGNOSIS:  Encounter Diagnosis  Name Primary?  . Malignant neoplasm of upper-inner quadrant of right breast in female, estrogen receptor positive ([place]) Yes    SUMMARY OF ONCOLOGIC HISTORY:   Breast cancer of upper-inner quadrant of right female breast ([place])   08/29/2007 Mammogram    Right breast mass macrolobulated 1.5 x 1.2 x 1.8 cm and 1.2 x 1.0 x 1.7 cm biopsy of lateral mass IDC ER 1% PR 0% HER-2 negative Ki-67 38%, MRI bilobed mass 4.9 x 2.6 x 4.8 cm      09/18/2007 - 03/03/2008 Neo-Adjuvant Chemotherapy    FEC x4 followed by dose dense Taxotere with Xeloda 1000 mg by mouth twice a day for 8 weeks (could not tolerate increased dose of Xeloda to 1500 mg), MRI showed decreased mass 3.4 x 2.6 x 3 cm      05/06/2008 Surgery    Right breast lumpectomy 3.8 cm tumor SLN negative T2, N0, M0 stage II A. pathologic staging      06/03/2008 - 07/22/2008 Radiation Therapy    Radiation therapy to lumpectomy site      06/05/2017 - 06/07/2017 Hospital Admission    Hospitalization for generalized anxiety disorder       CHIEF COMPLIANT: Follow-up of recent hospitalizations , annual surveillance checkup for breast cancer  INTERVAL HISTORY: [name] is a 66 year old with above-mentioned history of right breast cancer diagnosed and treated with neoadjuvant chemotherapy followed by surgery and radiation is currently on surveillance. She was recently hospitalized for generalized anxiety disorder. She also has had elevated balances for which she is taking magnesium. She was referred to psychiatry as well. She currently has diarrhea accompanied by stomach cramps. She things that she has irritable bowel syndrome. Yesterday's mammograms were normal.  REVIEW OF SYSTEMS:   Constitutional: Denies fevers, chills or abnormal weight loss Eyes: Denies blurriness of vision Ears, nose, mouth, throat, and face: Denies mucositis or sore  throat Respiratory: Denies cough, dyspnea or wheezes Cardiovascular: Denies palpitation, chest discomfort Gastrointestinal:  Diarrhea and cramps Skin: Denies abnormal skin rashes Lymphatics: Denies new lymphadenopathy or easy bruising Neurological:Denies numbness, tingling or new weaknesses Behavioral/Psych: Severe anxiety disorder Extremities: No lower extremity edema Breast:  denies any pain or lumps or nodules in either breasts All other systems were reviewed with the patient and are negative.  I have reviewed the past medical history, past surgical history, social history and family history with the patient and they are unchanged from previous note.  ALLERGIES:  is allergic to amoxicillin; azithromycin; bromfed; cephalexin; chlordiazepoxide-clidinium; claritin [loratadine]; clotrimazole; gabapentin; gatifloxacin; ibuprofen; iohexol; lidocaine; paroxetine; prednisone; pregabalin; propoxyphene n-acetaminophen; sertraline hcl; sulfa antibiotics; sulfadiazine; verapamil; adhesive [tape]; effexor [venlafaxine]; ibuprofen; latex; lisinopril; tussionex pennkinetic er [hydrocod polst-cpm polst er]; clonazepam; escitalopram; valium [diazepam]; codeine; dicyclomine hcl; hydralazine hcl; penicillins; and pseudoephedrine.  MEDICATIONS:  Current Outpatient Prescriptions  Medication Sig Dispense Refill  . acetaminophen (TYLENOL) 500 MG tablet Take 500 mg by mouth every 6 (six) hours as needed for moderate pain.    Marland Kitchen albuterol (PROAIR HFA) 108 (90 Base) MCG/ACT inhaler INHALE 2 PUFFS BY MOUTH IN TO THE LUNGS EVERY 4 HOURS AS NEEDED FOR WHEEZING OR SHORTNESS OF BREATH 8.5 Inhaler 3  . ALPRAZolam (XANAX) 0.5 MG tablet Take 1 tablet (0.5 mg total) by mouth 2 (two) times daily as needed for anxiety or sleep. 60 tablet 0  . ascorbic acid (VITAMIN C) 1000 MG tablet Take 500 mg by mouth  daily.     . aspirin EC 81 MG tablet Take 81 mg by mouth daily.    Marland Kitchen BIOTIN PO Take 1 tablet by mouth every morning.    Marland Kitchen  BREO ELLIPTA 100-25 MCG/INH AEPB Inhale 1 puff into the lungs daily.     . Calcium Carbonate-Vitamin D (CALCIUM-CARB 600 + D) 600-125 MG-UNIT TABS Take 1 tablet by mouth daily.     . cetirizine (ZYRTEC) 10 MG tablet Take 1 tablet (10 mg total) by mouth daily. 90 tablet 0  . cycloSPORINE (RESTASIS) 0.05 % ophthalmic emulsion Place 1 drop into both eyes 2 (two) times daily.      . diclofenac sodium (VOLTAREN) 1 % GEL Apply 2 g topically 2 (two) times daily. 1% GEL    . fluticasone (FLONASE) 50 MCG/ACT nasal spray Place 1-2 sprays into both nostrils daily. (Patient taking differently: Place 1-2 sprays into both nostrils daily as needed for allergies or rhinitis. ) 16 g 3  . losartan (COZAAR) 100 MG tablet Take 1 tablet (100 mg total) by mouth daily. 30 tablet 5  . magnesium chloride (SLOW-MAG) 64 MG TBEC SR tablet Take 2 tablets (128 mg total) by mouth daily. 15 tablet 0  . metoprolol tartrate (LOPRESSOR) 50 MG tablet Take 1 tablet (50 mg total) by mouth 2 (two) times daily. 30 tablet 1  . nitroGLYCERIN (NITROSTAT) 0.4 MG SL tablet Place 0.4 mg under the tongue every 5 (five) minutes as needed for chest pain.     . pantoprazole (PROTONIX) 40 MG tablet Take 1 tablet (40 mg total) by mouth 2 (two) times daily before a meal. 180 tablet 0  . potassium chloride (K-DUR,KLOR-CON) 10 MEQ tablet Take 1 tablet (10 mEq total) by mouth daily. 14 tablet 0  . pravastatin (PRAVACHOL) 40 MG tablet Take 40 mg by mouth in the evening. 90 tablet 0  . vitamin E (VITAMIN E) 400 UNIT capsule Take 400 Units by mouth daily.     No current facility-administered medications for this visit.     PHYSICAL EXAMINATION: ECOG PERFORMANCE STATUS: 2 - Symptomatic, <50% confined to bed  Vitals:   06/15/17 1402  BP: (!) 157/80  Pulse: 80  Resp: 18  Temp: 97.7 F (36.5 C)  SpO2: 100%   Filed Weights   06/15/17 1402  Weight: 176 lb 14.4 oz (80.2 kg)    GENERAL:alert, no distress and comfortable SKIN: skin color, texture,  turgor are normal, no rashes or significant lesions EYES: normal, Conjunctiva are pink and non-injected, sclera clear OROPHARYNX:no exudate, no erythema and lips, buccal mucosa, and tongue normal  NECK: supple, thyroid normal size, non-tender, without nodularity LYMPH:  no palpable lymphadenopathy in the cervical, axillary or inguinal LUNGS: clear to auscultation and percussion with normal breathing effort HEART: regular rate & rhythm and no murmurs and no lower extremity edema ABDOMEN:abdomen soft, non-tender and normal bowel sounds MUSCULOSKELETAL:no cyanosis of digits and no clubbing  NEURO: alert & oriented x 3 with fluent speech, no focal motor/sensory deficits EXTREMITIES: No lower extremity edema  LABORATORY DATA:  I have reviewed the data as listed   Chemistry      Component Value Date/Time   NA 143 06/14/2017 1053   NA 142 06/11/2015 1057   K 3.8 06/14/2017 1053   K 3.7 06/11/2015 1057   CL 102 06/14/2017 1053   CL 104 12/07/2012 1341   CO2 25 06/14/2017 1053   CO2 29 06/11/2015 1057   BUN 10 06/14/2017 1053   BUN 15.4  06/11/2015 1057   CREATININE 0.75 06/14/2017 1053   CREATININE 0.8 06/11/2015 1057      Component Value Date/Time   CALCIUM 10.0 06/14/2017 1053   CALCIUM 9.8 06/11/2015 1057   ALKPHOS 53 06/05/2017 1957   ALKPHOS 55 06/11/2015 1057   AST 26 06/05/2017 1957   AST 24 06/11/2015 1057   ALT 32 06/05/2017 1957   ALT 25 06/11/2015 1057   BILITOT 1.2 06/05/2017 1957   BILITOT 0.59 06/11/2015 1057       Lab Results  Component Value Date   WBC 6.3 06/05/2017   HGB 12.5 06/05/2017   HCT 36.9 06/05/2017   MCV 89.3 06/05/2017   PLT 233 06/05/2017   NEUTROABS 2.1 06/11/2015    ASSESSMENT & PLAN:  Breast cancer of upper-inner quadrant of right female breast ([place]) Right breast cancer diagnosed 08/29/2007 T2, N0, M0 stage II A. pathologic staging after neoadjuvant chemotherapy completed 03/03/2008 for ER/PR negative HER-2 negative breast cancer  treated with lumpectomy 05/06/2008 followed by radiation which was completed 07/22/2008  Breast Cancer Surveillance: 1. Breast exam 06/15/2017: Normal, and redness near the radiation area 2. Mammogram 06/14/2017 No abnormalities. Postsurgical changes. Breast Density Category C.   Severe anxiety disorder: Under medications and going to see a psychiatrist. Return to clinic in 1 to follow-up  I spent 15 minutes talking to the patient of which more than half was spent in counseling and coordination of care.  No orders of the defined types were placed in this encounter.  The patient has a good understanding of the overall plan. she agrees with it. she will call with any problems that may develop before the next visit here.   Rulon Eisenmenger, MD 06/15/17

## 2017-06-15 NOTE — Telephone Encounter (Signed)
Printed patient avs and calender per 10/4 los

## 2017-06-19 ENCOUNTER — Other Ambulatory Visit (INDEPENDENT_AMBULATORY_CARE_PROVIDER_SITE_OTHER): Payer: Medicare Other

## 2017-06-19 ENCOUNTER — Encounter: Payer: Self-pay | Admitting: Internal Medicine

## 2017-06-19 ENCOUNTER — Ambulatory Visit (INDEPENDENT_AMBULATORY_CARE_PROVIDER_SITE_OTHER): Payer: Medicare Other | Admitting: Internal Medicine

## 2017-06-19 VITALS — BP 134/79 | HR 72 | Temp 98.2°F | Ht 63.0 in | Wt 178.1 lb

## 2017-06-19 DIAGNOSIS — E876 Hypokalemia: Secondary | ICD-10-CM

## 2017-06-19 DIAGNOSIS — I10 Essential (primary) hypertension: Secondary | ICD-10-CM | POA: Diagnosis not present

## 2017-06-19 DIAGNOSIS — I2510 Atherosclerotic heart disease of native coronary artery without angina pectoris: Secondary | ICD-10-CM

## 2017-06-19 DIAGNOSIS — F411 Generalized anxiety disorder: Secondary | ICD-10-CM

## 2017-06-19 NOTE — Assessment & Plan Note (Signed)
Patient mentions that her anxiety seems to be well controlled currently. She was worried that the increased dose of  metoprolol was causing some tingling in her arms but those symptoms have not recurred this morning when she took that and she feels well.  Plan -continue the current plan of follow up with psychiatry and psychology -pcp appt on dec 20th -continue xanax at current dose

## 2017-06-19 NOTE — Patient Instructions (Addendum)
Thank you for your visit today Please continue your metoprolol 50 mg twice a day Please follow up here in 6-8 weeks- you have appt scheduled on Dec 20th with your PCP- but if you would like to be seen earlier than that, you can come in earlier.  Please follow up with Dr Adele Schilder and Ms Ouida Sills- it is very difficult to schedule these appointments so be sure to not miss these
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
## 2017-06-19 NOTE — Addendum Note (Signed)
Addended by: Burgess Estelle A on: 06/19/2017 01:38 PM   Modules accepted: Orders

## 2017-06-19 NOTE — Assessment & Plan Note (Addendum)
Pt has been compliant with her Mag supplementation. She obtained Mag lab today  Plan -continue current dose of magnesium -will let pt know about the labs   Addendum: 10/9 Informed the pt that her Mag level is normal- she should continue the mag supplements

## 2017-06-19 NOTE — Assessment & Plan Note (Signed)
BP Readings from Last 3 Encounters:  06/19/17 134/79  06/15/17 (!) 157/80  06/14/17 (!) 155/82   Patient follows up from last week regarding hypertension (she said she wanted to follow up just to be sure) and initially she came in as a lab visit for Mag draw. On the last visit, we had increased the metoprolol dose to 50 mg BID, from 25 mg BID. She says that she started the increased dose on Friday, and then she experienced some tingling in the hands, so she went back down to the 25 mg dose, then everything resolved. THen this morning, she took the 50 mg dose again, and she has not experienced any side effects.  She denies any headaches, dizziness, or muscle cramps. SHe has been compliant with her magnesium supplementation.  Her repeat BP was much better at 343 systolic  Plan -continue metoprolol 50 mg BID and losartan -reassured her that the metoprolol is not causing her symptoms as she has been taking this for a long time -follow up in Dec with PCP - has appt. But advised that if she would like to be seen earlier- she can call to make earlier appt

## 2017-06-20 LAB — MAGNESIUM: Magnesium: 1.6 mg/dL (ref 1.6–2.3)

## 2017-06-22 NOTE — Progress Notes (Signed)
Internal Medicine Clinic Attending  Case discussed with Dr. Saraiya at the time of the visit.  We reviewed the resident's history and exam and pertinent patient test results.  I agree with the assessment, diagnosis, and plan of care documented in the resident's note.  

## 2017-06-26 ENCOUNTER — Other Ambulatory Visit: Payer: Self-pay | Admitting: Internal Medicine

## 2017-06-28 ENCOUNTER — Ambulatory Visit (INDEPENDENT_AMBULATORY_CARE_PROVIDER_SITE_OTHER): Payer: Medicare Other | Admitting: Psychiatry

## 2017-06-28 ENCOUNTER — Ambulatory Visit: Payer: Medicare Other | Admitting: Diagnostic Neuroimaging

## 2017-06-28 DIAGNOSIS — F4323 Adjustment disorder with mixed anxiety and depressed mood: Secondary | ICD-10-CM | POA: Diagnosis not present

## 2017-07-03 ENCOUNTER — Telehealth: Payer: Self-pay | Admitting: Internal Medicine

## 2017-07-03 ENCOUNTER — Other Ambulatory Visit: Payer: Self-pay | Admitting: Internal Medicine

## 2017-07-03 DIAGNOSIS — E876 Hypokalemia: Secondary | ICD-10-CM

## 2017-07-03 MED ORDER — PANTOPRAZOLE SODIUM 40 MG PO TBEC: 40.0000 mg | DELAYED_RELEASE_TABLET | Freq: Two times a day (BID) | ORAL | 0 refills | Status: DC

## 2017-07-03 NOTE — Telephone Encounter (Signed)
Rx sent. Needs office visit for additional refills.

## 2017-07-03 NOTE — Telephone Encounter (Signed)
Does not need a refill at this point. Has completed her prescribed course and most recent potassium was within normal limits. Thank you.

## 2017-07-06 ENCOUNTER — Other Ambulatory Visit (INDEPENDENT_AMBULATORY_CARE_PROVIDER_SITE_OTHER): Payer: Medicare Other

## 2017-07-06 ENCOUNTER — Telehealth: Payer: Self-pay | Admitting: *Deleted

## 2017-07-06 NOTE — Progress Notes (Signed)
magnesium 

## 2017-07-06 NOTE — Telephone Encounter (Signed)
Dr Tarri Abernethy, could you please call [name] asap, she called yesterday, set an appt for lab draw up and stated she is to have a MAG LEVEL drawn appr every 2 weeks, she told the scheduler that the LAB people know about it and would order it. Lab came to me. Could you call her and discuss with her that there is no need for this at present. Thank you so much!

## 2017-07-07 LAB — MAGNESIUM: Magnesium: 2.2 mg/dL (ref 1.6–2.3)

## 2017-07-12 ENCOUNTER — Other Ambulatory Visit: Payer: Self-pay | Admitting: Internal Medicine

## 2017-07-13 ENCOUNTER — Other Ambulatory Visit: Payer: Self-pay | Admitting: Internal Medicine

## 2017-07-13 NOTE — Telephone Encounter (Signed)
Can you please call this in for me. Thank you.

## 2017-07-13 NOTE — Telephone Encounter (Signed)
ALPRAZolam (XANAX) 0.5 MG tablet   Refill request @ [facility] on [address].

## 2017-07-13 NOTE — Telephone Encounter (Signed)
done

## 2017-07-13 NOTE — Telephone Encounter (Signed)
Refill approved in prior request, called to pharmacy

## 2017-07-21 ENCOUNTER — Ambulatory Visit (INDEPENDENT_AMBULATORY_CARE_PROVIDER_SITE_OTHER): Payer: Medicare Other | Admitting: Psychiatry

## 2017-07-21 ENCOUNTER — Encounter (HOSPITAL_COMMUNITY): Payer: Self-pay | Admitting: Psychiatry

## 2017-07-21 VITALS — BP 128/88 | HR 72 | Ht 63.0 in | Wt 182.8 lb

## 2017-07-21 DIAGNOSIS — J449 Chronic obstructive pulmonary disease, unspecified: Secondary | ICD-10-CM

## 2017-07-21 DIAGNOSIS — R450 Nervousness: Secondary | ICD-10-CM | POA: Diagnosis not present

## 2017-07-21 DIAGNOSIS — Z87891 Personal history of nicotine dependence: Secondary | ICD-10-CM | POA: Diagnosis not present

## 2017-07-21 DIAGNOSIS — Z810 Family history of intellectual disabilities: Secondary | ICD-10-CM

## 2017-07-21 DIAGNOSIS — I2510 Atherosclerotic heart disease of native coronary artery without angina pectoris: Secondary | ICD-10-CM

## 2017-07-21 DIAGNOSIS — F410 Panic disorder [episodic paroxysmal anxiety] without agoraphobia: Secondary | ICD-10-CM | POA: Diagnosis not present

## 2017-07-21 DIAGNOSIS — F411 Generalized anxiety disorder: Secondary | ICD-10-CM
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
## 2017-07-25 ENCOUNTER — Other Ambulatory Visit: Payer: Self-pay | Admitting: Internal Medicine

## 2017-07-26 NOTE — Telephone Encounter (Signed)
She no longer needs this medicine as it was felt that her hypokalemia was a result of the chlorthalidone. Thank you

## 2017-07-27 ENCOUNTER — Ambulatory Visit (INDEPENDENT_AMBULATORY_CARE_PROVIDER_SITE_OTHER): Payer: Medicare Other | Admitting: Psychiatry

## 2017-07-27 DIAGNOSIS — F4323 Adjustment disorder with mixed anxiety and depressed mood: Secondary | ICD-10-CM

## 2017-08-01 ENCOUNTER — Ambulatory Visit: Payer: Self-pay | Admitting: Psychiatry

## 2017-08-10 ENCOUNTER — Other Ambulatory Visit: Payer: Self-pay | Admitting: Internal Medicine

## 2017-08-10 NOTE — Telephone Encounter (Signed)
Xanax rx called to [facility].

## 2017-08-10 NOTE — Telephone Encounter (Signed)
ALPRAZolam (XANAX) 0.5 MG tablet, refill request @ [facility] on [address]

## 2017-08-15 ENCOUNTER — Other Ambulatory Visit: Payer: Self-pay | Admitting: Internal Medicine

## 2017-08-15 DIAGNOSIS — M5417 Radiculopathy, lumbosacral region: Secondary | ICD-10-CM | POA: Diagnosis not present

## 2017-08-15 DIAGNOSIS — G894 Chronic pain syndrome: Secondary | ICD-10-CM | POA: Diagnosis not present

## 2017-08-15 DIAGNOSIS — M7918 Myalgia, other site: Secondary | ICD-10-CM | POA: Diagnosis not present

## 2017-08-15 DIAGNOSIS — M5136 Other intervertebral disc degeneration, lumbar region: Secondary | ICD-10-CM | POA: Diagnosis not present

## 2017-08-31 ENCOUNTER — Encounter: Payer: Self-pay | Admitting: Internal Medicine

## 2017-08-31 ENCOUNTER — Other Ambulatory Visit: Payer: Self-pay | Admitting: Internal Medicine

## 2017-08-31 ENCOUNTER — Other Ambulatory Visit: Payer: Self-pay

## 2017-08-31 ENCOUNTER — Ambulatory Visit (INDEPENDENT_AMBULATORY_CARE_PROVIDER_SITE_OTHER): Payer: Medicare Other | Admitting: Internal Medicine

## 2017-08-31 VITALS — BP 171/86 | HR 99 | Temp 98.5°F | Ht 63.0 in | Wt 182.5 lb

## 2017-08-31 DIAGNOSIS — Z87891 Personal history of nicotine dependence: Secondary | ICD-10-CM | POA: Diagnosis not present

## 2017-08-31 DIAGNOSIS — R300 Dysuria: Secondary | ICD-10-CM

## 2017-08-31 DIAGNOSIS — Z862 Personal history of diseases of the blood and blood-forming organs and certain disorders involving the immune mechanism: Secondary | ICD-10-CM | POA: Diagnosis not present

## 2017-08-31 DIAGNOSIS — J309 Allergic rhinitis, unspecified: Secondary | ICD-10-CM

## 2017-08-31 DIAGNOSIS — H9201 Otalgia, right ear: Secondary | ICD-10-CM

## 2017-08-31 DIAGNOSIS — I10 Essential (primary) hypertension: Secondary | ICD-10-CM

## 2017-08-31 DIAGNOSIS — Z9049 Acquired absence of other specified parts of digestive tract: Secondary | ICD-10-CM | POA: Diagnosis not present

## 2017-08-31 DIAGNOSIS — R0789 Other chest pain: Secondary | ICD-10-CM | POA: Diagnosis not present

## 2017-08-31 DIAGNOSIS — B349 Viral infection, unspecified: Secondary | ICD-10-CM

## 2017-08-31 DIAGNOSIS — Z7982 Long term (current) use of aspirin: Secondary | ICD-10-CM | POA: Diagnosis not present

## 2017-08-31 DIAGNOSIS — Z79899 Other long term (current) drug therapy: Secondary | ICD-10-CM | POA: Diagnosis not present

## 2017-08-31 DIAGNOSIS — I2510 Atherosclerotic heart disease of native coronary artery without angina pectoris: Secondary | ICD-10-CM

## 2017-08-31 DIAGNOSIS — I110 Hypertensive heart disease with heart failure: Secondary | ICD-10-CM | POA: Diagnosis not present

## 2017-08-31 DIAGNOSIS — I5032 Chronic diastolic (congestive) heart failure: Secondary | ICD-10-CM

## 2017-08-31 DIAGNOSIS — Z886 Allergy status to analgesic agent status: Secondary | ICD-10-CM

## 2017-08-31 DIAGNOSIS — R071 Chest pain on breathing: Secondary | ICD-10-CM

## 2017-08-31 LAB — POCT URINALYSIS DIPSTICK
Bilirubin, UA: NEGATIVE
Blood, UA: NEGATIVE
Glucose, UA: NEGATIVE
KETONES UA: NEGATIVE
LEUKOCYTES UA: NEGATIVE
NITRITE UA: NEGATIVE
PH UA: 6 (ref 5.0–8.0)
PROTEIN UA: NEGATIVE
Spec Grav, UA: 1.01 (ref 1.010–1.025)
Urobilinogen, UA: 0.2 E.U./dL

## 2017-08-31 MED ORDER — IBUPROFEN 200 MG PO CAPS: 200.0000 mg | ORAL_CAPSULE | Freq: Four times a day (QID) | ORAL | 0 refills | Status: DC

## 2017-08-31 MED ORDER — CETIRIZINE HCL 10 MG PO TABS: 10.0000 mg | ORAL_TABLET | Freq: Every day | ORAL | 3 refills | Status: DC

## 2017-08-31 MED ORDER — FUROSEMIDE 20 MG PO TABS: 20.0000 mg | ORAL_TABLET | ORAL | 0 refills | Status: DC

## 2017-08-31 MED ORDER — POTASSIUM CHLORIDE ER 10 MEQ PO TBCR: 20.0000 meq | EXTENDED_RELEASE_TABLET | ORAL | 0 refills | Status: DC

## 2017-08-31 MED ORDER — METOPROLOL TARTRATE 50 MG PO TABS: 50.0000 mg | ORAL_TABLET | Freq: Two times a day (BID) | ORAL | 6 refills | Status: DC

## 2017-08-31 NOTE — Assessment & Plan Note (Addendum)
[name] is now voicing concerns about lower extremity edema that is persistent in nature. She also acknowledges some shortness of breath with exertion. She denies orthopnea. She had an echocardiogram in 2017 that illustrated a normal ejection fraction but did show grade 1 diastolic dysfunction. She has a history of CAD and ischemic heart disease.   She is currently on pravastatin, metoprolol, and losartan. We discussed adding low-dose loop diuretic to her regimen today to aid in her symptoms. She would like to try it. She does have a history of severe hypokalemia, therefore, we will start with furosemide 20 mg every other day along with potassium supplementation of 20 mEq on days she takes her furosemide. She will come back in one week for a lab only visit at which point we will check her BMP and mag.  Plan: -Continue metoprolol pravastatin, and losartan -Start furosemide 20 mg every other day and potassium supplementation 20 mEq every other day -The patient will return in 1 week for lab only visit. At that point we will check a BMP and a magnesium level

## 2017-08-31 NOTE — Assessment & Plan Note (Signed)
Patient voices concerns about right upper quadrant abdominal pain as stabbing in nature. The attacks appear to wax and wane. She has tenderness to palpation of the chest wall. The pain elicited is identical to the attack she is experiencing. The most likely etiology of her pain is costochondritis/musculoskeletal.  We discussed that the primary treatment for this is NSAID therapy. She has a history of allergies to NSAIDs. States that when she takes ibuprofen she breaks out in a rash becomes flushed. She has not taken any type of NSAID in the past 2-3 years however she does take aspirin daily without any issues. We discussed a trial of NSAIDs. The risks and benefits were explained. We discussed possible anaphylaxis and what needs to be done if she begins to experience any signs or symptoms of anaphylaxis. She voices understanding and agrees with the current treatment plan.  Plan: - Ibuprofen 200 mg trial dose. If patient can tolerate and she will then take 200 mg 3 times daily. - Standard return precautions given. Patient voices understanding of plan along with the risks/benefits of trying NSAID therapy with her known allergy.

## 2017-08-31 NOTE — Assessment & Plan Note (Signed)
Patient requesting a refill of her Zyrtec today. Refill sent.

## 2017-08-31 NOTE — Assessment & Plan Note (Signed)
[name] presented today with sore throat and ear pain of 7 days' duration. At this point her signs and symptoms are consistent with an acute viral illness. We discussed that if things do not continue to improve she may return to the clinic for further evaluation and management. At this point I do not see any benefit of antibiotic therapy. Patient agrees and voices understanding of the plan.
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
## 2017-08-31 NOTE — Patient Instructions (Addendum)
It was a pleasure to see you today. I hope you have a wonderful Holiday season.   START taking Ibuprofen 200 mg every 6 hours. If you notice any throat swelling, difficulty breathing, hives, or wheezing go to the emergency department.   START taking the Furosemide 20 mg every other day. When you take your Furosemide take the potassium supplementation. Come back in 1 week for a lab only visit.   Continue to take Tylenol 1000 mg (2 tablets) three times a day for your sore throat. If things are not improving please come back to [facility].   Continue to take all your medications as prescribed. I will see you in 3 months.

## 2017-08-31 NOTE — Assessment & Plan Note (Addendum)
[name] currently has uncontrolled hypertension. She states that she has been compliant with her losartan and metoprolol. However over the last 3-4 days she has been out of her metoprolol and has not been able to get a refill. Her blood pressure is acutely elevated today. Since she has been out of her metoprolol for several days, we will hold off on adding another blood pressure medicine at this point. Instead we will restart her metoprolol and see how her blood pressure does.  Plan: - Continue losartan and metoprolol as prescribed

## 2017-09-01 NOTE — Progress Notes (Signed)
I saw and evaluated the patient.  I personally confirmed the key portions of Dr. Helberg's history and exam and reviewed pertinent patient test results.  The assessment, diagnosis, and plan were formulated together and I agree with the documentation in the resident's note. 

## 2017-09-01 NOTE — Telephone Encounter (Signed)
Requesting 90 days supply. Thanks

## 2017-09-03 ENCOUNTER — Emergency Department (HOSPITAL_COMMUNITY): Payer: Medicare Other

## 2017-09-03 ENCOUNTER — Other Ambulatory Visit: Payer: Self-pay

## 2017-09-03 ENCOUNTER — Emergency Department (HOSPITAL_COMMUNITY)
Admission: EM | Admit: 2017-09-03 | Discharge: 2017-09-04 | Disposition: A | Discharge status: Home / Self Care | Payer: Medicare Other | Attending: Emergency Medicine | Admitting: Emergency Medicine

## 2017-09-03 ENCOUNTER — Encounter (HOSPITAL_COMMUNITY): Payer: Self-pay | Admitting: Emergency Medicine

## 2017-09-03 DIAGNOSIS — J020 Streptococcal pharyngitis: Secondary | ICD-10-CM | POA: Diagnosis not present

## 2017-09-03 DIAGNOSIS — R509 Fever, unspecified: Secondary | ICD-10-CM | POA: Diagnosis not present

## 2017-09-03 DIAGNOSIS — R112 Nausea with vomiting, unspecified: Secondary | ICD-10-CM | POA: Insufficient documentation

## 2017-09-03 DIAGNOSIS — Z79899 Other long term (current) drug therapy: Secondary | ICD-10-CM | POA: Insufficient documentation

## 2017-09-03 DIAGNOSIS — Z87891 Personal history of nicotine dependence: Secondary | ICD-10-CM | POA: Diagnosis not present

## 2017-09-03 DIAGNOSIS — G459 Transient cerebral ischemic attack, unspecified: Secondary | ICD-10-CM | POA: Diagnosis not present

## 2017-09-03 DIAGNOSIS — M5012 Mid-cervical disc disorder, unspecified level: Secondary | ICD-10-CM | POA: Diagnosis not present

## 2017-09-03 DIAGNOSIS — R079 Chest pain, unspecified: Secondary | ICD-10-CM | POA: Diagnosis not present

## 2017-09-03 DIAGNOSIS — R05 Cough: Secondary | ICD-10-CM | POA: Diagnosis not present

## 2017-09-03 DIAGNOSIS — M7918 Myalgia, other site: Secondary | ICD-10-CM | POA: Diagnosis not present

## 2017-09-03 DIAGNOSIS — R0789 Other chest pain: Secondary | ICD-10-CM | POA: Insufficient documentation

## 2017-09-03 DIAGNOSIS — R0602 Shortness of breath: Secondary | ICD-10-CM | POA: Diagnosis not present

## 2017-09-03 DIAGNOSIS — I10 Essential (primary) hypertension: Secondary | ICD-10-CM | POA: Insufficient documentation

## 2017-09-03 DIAGNOSIS — J029 Acute pharyngitis, unspecified: Secondary | ICD-10-CM | POA: Diagnosis present

## 2017-09-03 DIAGNOSIS — I2510 Atherosclerotic heart disease of native coronary artery without angina pectoris: Secondary | ICD-10-CM | POA: Insufficient documentation

## 2017-09-03 LAB — URINALYSIS, ROUTINE W REFLEX MICROSCOPIC
Bilirubin Urine: NEGATIVE
GLUCOSE, UA: NEGATIVE mg/dL
HGB URINE DIPSTICK: NEGATIVE
KETONES UR: NEGATIVE mg/dL
LEUKOCYTES UA: NEGATIVE
Nitrite: NEGATIVE
Protein, ur: NEGATIVE mg/dL
Specific Gravity, Urine: 1.011 (ref 1.005–1.030)
pH: 5 (ref 5.0–8.0)

## 2017-09-03 LAB — CBC WITH DIFFERENTIAL/PLATELET
Basophils Absolute: 0 10*3/uL (ref 0.0–0.1)
Basophils Relative: 0 %
Eosinophils Absolute: 0 10*3/uL (ref 0.0–0.7)
Eosinophils Relative: 0 %
HCT: 37 % (ref 36.0–46.0)
Hemoglobin: 12.5 g/dL (ref 12.0–15.0)
Lymphocytes Relative: 14 %
Lymphs Abs: 1.7 10*3/uL (ref 0.7–4.0)
MCH: 30.7 pg (ref 26.0–34.0)
MCHC: 33.8 g/dL (ref 30.0–36.0)
MCV: 90.9 fL (ref 78.0–100.0)
Monocytes Absolute: 0.8 10*3/uL (ref 0.1–1.0)
Monocytes Relative: 7 %
Neutro Abs: 9.1 10*3/uL — ABNORMAL HIGH (ref 1.7–7.7)
Neutrophils Relative %: 79 %
Platelets: 171 10*3/uL (ref 150–400)
RBC: 4.07 MIL/uL (ref 3.87–5.11)
RDW: 13.4 % (ref 11.5–15.5)
WBC: 11.6 10*3/uL — ABNORMAL HIGH (ref 4.0–10.5)

## 2017-09-03 LAB — MAGNESIUM: MAGNESIUM: 1.8 mg/dL (ref 1.7–2.4)

## 2017-09-03 LAB — COMPREHENSIVE METABOLIC PANEL
ALT: 17 U/L (ref 14–54)
AST: 20 U/L (ref 15–41)
Albumin: 4.1 g/dL (ref 3.5–5.0)
Alkaline Phosphatase: 55 U/L (ref 38–126)
Anion gap: 7 (ref 5–15)
BUN: 13 mg/dL (ref 6–20)
CO2: 25 mmol/L (ref 22–32)
Calcium: 9.6 mg/dL (ref 8.9–10.3)
Chloride: 105 mmol/L (ref 101–111)
Creatinine, Ser: 0.81 mg/dL (ref 0.44–1.00)
GFR calc Af Amer: >60 mL/min (ref >60)
GFR calc non Af Amer: >60 mL/min (ref >60)
Glucose, Bld: 116 mg/dL — ABNORMAL HIGH (ref 65–99)
Potassium: 3.1 mmol/L — ABNORMAL LOW (ref 3.5–5.1)
Sodium: 137 mmol/L (ref 135–145)
Total Bilirubin: 0.9 mg/dL (ref 0.3–1.2)
Total Protein: 7 g/dL (ref 6.5–8.1)

## 2017-09-03 LAB — I-STAT TROPONIN, ED
Troponin i, poc: 0 ng/mL (ref 0.00–0.08)
Troponin i, poc: 0 ng/mL (ref 0.00–0.08)

## 2017-09-03 LAB — D-DIMER, QUANTITATIVE (NOT AT ARMC): D-Dimer, Quant: 1.38 ug/mL-FEU — ABNORMAL HIGH (ref 0.00–0.50)

## 2017-09-03 LAB — MONONUCLEOSIS SCREEN: MONO SCREEN: NEGATIVE

## 2017-09-03 LAB — RAPID STREP SCREEN (MED CTR MEBANE ONLY): Streptococcus, Group A Screen (Direct): POSITIVE — AB

## 2017-09-03 MED ORDER — TECHNETIUM TO 99M ALBUMIN AGGREGATED
3.8000 | Freq: Once | INTRAVENOUS | Status: AC | PRN
  Administered 2017-09-03: 3.8 via INTRAVENOUS

## 2017-09-03 MED ORDER — SODIUM CHLORIDE 0.9 % IV BOLUS (SEPSIS)
1000.0000 mL | Freq: Once | INTRAVENOUS | Status: AC
  Administered 2017-09-03: 1000 mL via INTRAVENOUS

## 2017-09-03 MED ORDER — POTASSIUM CHLORIDE CRYS ER 20 MEQ PO TBCR
40.0000 meq | EXTENDED_RELEASE_TABLET | Freq: Once | ORAL | Status: AC
  Administered 2017-09-03: 40 meq via ORAL
  Filled 2017-09-03: qty 2

## 2017-09-03 MED ORDER — CLINDAMYCIN PHOSPHATE 600 MG/50ML IV SOLN
600.0000 mg | Freq: Once | INTRAVENOUS | Status: AC
  Administered 2017-09-03: 600 mg via INTRAVENOUS
  Filled 2017-09-03: qty 50

## 2017-09-03 MED ORDER — TECHNETIUM TC 99M DIETHYLENETRIAME-PENTAACETIC ACID
28.4000 | Freq: Once | INTRAVENOUS | Status: AC | PRN
  Administered 2017-09-03: 28.4 via INTRAVENOUS

## 2017-09-03 MED ORDER — ACETAMINOPHEN 325 MG PO TABS
650.0000 mg | ORAL_TABLET | Freq: Once | ORAL | Status: AC
  Administered 2017-09-03: 650 mg via ORAL
  Filled 2017-09-03: qty 2

## 2017-09-03 MED ORDER — CLINDAMYCIN HCL 150 MG PO CAPS: 300.0000 mg | ORAL_CAPSULE | Freq: Three times a day (TID) | ORAL | 0 refills | Status: AC

## 2017-09-03 NOTE — ED Triage Notes (Addendum)
Patient c/o worsening sore throat with body aches, chills, cough and congestion x1 week. Reports three episodes of emesis last night. States she was seen at PCP and diagnosed with virus. Speaking in full sentences without difficulty.

## 2017-09-03 NOTE — ED Notes (Signed)
Patient transported to [facility] 

## 2017-09-03 NOTE — Discharge Instructions (Addendum)
Please take all of your antibiotics until finished!   You may develop abdominal discomfort or diarrhea from the antibiotic.  You may help offset this with probiotics which you can buy or get in yogurt. Do not eat  or take the probiotics until 2 hours after your antibiotic.   Take [phone] mg of Tylenol every 6 hours as needed for fever and pain.  Use warm water salt gargles and throat lozenges as well as warm teas and drinks for your sore throat.  Follow-up with your primary care physician in the next 2-3 days for reevaluation of your symptoms.  Drink plenty of fluids and get plenty of rest.  Return to the ED immediately for any concerning signs or symptom develop such as persisting chest pain, fevers, or difficulty breathing or swallowing.

## 2017-09-03 NOTE — ED Provider Notes (Signed)
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  On reevaluation, patient is resting comfortably and states she is feeling better.  She is requesting discharge home.  Patient will follow up with her primary care physician in the next 3-4 days for reevaluation.  Discussed indications for return to the ED. Pt verbalized understanding of and agreement with plan and is safe for discharge home at this time. Patient seen and evaluated by Dr. Darl Householder who agrees with assessment and plan at this time.   Final Clinical Impressions(s) / ED Diagnoses   Final diagnoses:  Strep pharyngitis  Atypical chest pain    ED Discharge Orders        Ordered    clindamycin (CLEOCIN) 150 MG capsule  3 times daily     09/03/17 2342       Renita Papa, PA-C 09/04/17 0049    Drenda Freeze, MD 09/04/17 [phone]

## 2017-09-03 NOTE — ED Notes (Signed)
Patient transported to MRI 

## 2017-09-05 ENCOUNTER — Other Ambulatory Visit: Payer: Self-pay | Admitting: Internal Medicine

## 2017-09-06 ENCOUNTER — Ambulatory Visit (INDEPENDENT_AMBULATORY_CARE_PROVIDER_SITE_OTHER): Payer: Medicare Other | Admitting: Diagnostic Neuroimaging

## 2017-09-06 ENCOUNTER — Encounter: Payer: Self-pay | Admitting: Diagnostic Neuroimaging

## 2017-09-06 VITALS — BP 164/91 | HR 80 | Ht 63.0 in | Wt 180.4 lb

## 2017-09-06 DIAGNOSIS — M542 Cervicalgia: Secondary | ICD-10-CM

## 2017-09-06 DIAGNOSIS — M5412 Radiculopathy, cervical region: Secondary | ICD-10-CM | POA: Diagnosis not present

## 2017-09-06 DIAGNOSIS — I2510 Atherosclerotic heart disease of native coronary artery without angina pectoris: Secondary | ICD-10-CM | POA: Diagnosis not present

## 2017-09-06 DIAGNOSIS — M4802 Spinal stenosis, cervical region: Secondary | ICD-10-CM

## 2017-09-06 NOTE — Patient Instructions (Signed)
  NECK PAIN / PINCHED NERVES  - continue conservative mgmt with pain mgmt (Dr. Darral Dash) - consider physical therapy and massage therapy - if symptoms get worse, then may consider neurosurgery consult for surgical treatment
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
## 2017-09-07 ENCOUNTER — Other Ambulatory Visit: Payer: Self-pay | Admitting: Internal Medicine

## 2017-09-13 ENCOUNTER — Ambulatory Visit (INDEPENDENT_AMBULATORY_CARE_PROVIDER_SITE_OTHER): Payer: Medicare Other | Admitting: Internal Medicine

## 2017-09-13 ENCOUNTER — Other Ambulatory Visit: Payer: Self-pay

## 2017-09-13 ENCOUNTER — Telehealth: Payer: Self-pay | Admitting: Internal Medicine

## 2017-09-13 ENCOUNTER — Encounter: Payer: Self-pay | Admitting: Internal Medicine

## 2017-09-13 VITALS — BP 169/84 | HR 80 | Temp 98.5°F | Ht 63.0 in | Wt 182.4 lb

## 2017-09-13 DIAGNOSIS — E876 Hypokalemia: Secondary | ICD-10-CM | POA: Diagnosis not present

## 2017-09-13 DIAGNOSIS — H9201 Otalgia, right ear: Secondary | ICD-10-CM | POA: Diagnosis not present

## 2017-09-13 DIAGNOSIS — Z7982 Long term (current) use of aspirin: Secondary | ICD-10-CM | POA: Diagnosis not present

## 2017-09-13 DIAGNOSIS — R509 Fever, unspecified: Secondary | ICD-10-CM

## 2017-09-13 DIAGNOSIS — Z87891 Personal history of nicotine dependence: Secondary | ICD-10-CM

## 2017-09-13 DIAGNOSIS — R0789 Other chest pain: Secondary | ICD-10-CM

## 2017-09-13 DIAGNOSIS — Z8709 Personal history of other diseases of the respiratory system: Secondary | ICD-10-CM | POA: Diagnosis present

## 2017-09-13 DIAGNOSIS — R591 Generalized enlarged lymph nodes: Secondary | ICD-10-CM | POA: Diagnosis not present

## 2017-09-13 NOTE — Telephone Encounter (Signed)
Xanax rx called to [facility].

## 2017-09-13 NOTE — Patient Instructions (Signed)
[name],  It was a pleasure meeting you today. Please return to the clinic if her symptoms do not improve  or worsen.

## 2017-09-13 NOTE — Assessment & Plan Note (Signed)
Assessment: Hypokalemia Patient was found to have a potassium of 3.1 in the ED. Will get repeat lab work to see if potassium replacement as needed.  Plan -Bmet

## 2017-09-13 NOTE — Assessment & Plan Note (Signed)
Assessment: History of strep pharyngitis Patient presents today after being treated for strep pharyngitis with 7 day course of clindamycin. She appears comfortable speaking in complete sentences. She is currently afebrile satting well on room air. She has some mild lymphadenopathy on the left side of her neck. Her throat is not erythematous and there is no evidence of peritonsillar abscess. She was able to swallow easily on exam.  Her ears do not show signs of infection. Her lungs sound clear and heart regular rate and rhythm. I reassured patient that her symptoms would take time to dissipate. I told her if her symptoms do worsen that she should return to the internal medicine clinic.   plan -Continue supportive care for symptoms -Return if symptoms worsen

## 2017-09-13 NOTE — Telephone Encounter (Signed)
Saw pysch MD in nov who planned no further F/U since benzo Rx'd by PCP and pt doing well on current dose.  Notes mention counselor but cannot access those notes.  Long term plan for benzo not clear in Epic. PCP has openings in Feb.    Pt needs to sch PCP app (pls refer to this note in appt reason) to design long term benzo plan.  Rx 2 months supply until PCP appt

## 2017-09-13 NOTE — Telephone Encounter (Signed)
Xanax called in to [place]
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
## 2017-09-13 NOTE — Telephone Encounter (Signed)
Patient is requesting refill on Xanax, pls call patient

## 2017-09-14 LAB — BMP8+ANION GAP
Anion Gap: 13 mmol/L (ref 10.0–18.0)
BUN/Creatinine Ratio: 16 (ref 12–28)
BUN: 12 mg/dL (ref 8–27)
CALCIUM: 9.9 mg/dL (ref 8.7–10.3)
CHLORIDE: 105 mmol/L (ref 96–106)
CO2: 25 mmol/L (ref 20–29)
Creatinine, Ser: 0.75 mg/dL (ref 0.57–1.00)
GFR calc non Af Amer: 85 mL/min/{1.73_m2} (ref >59)
GFR, EST AFRICAN AMERICAN: 97 mL/min/{1.73_m2} (ref >59)
GLUCOSE: 107 mg/dL — AB (ref 65–99)
POTASSIUM: 4 mmol/L (ref 3.5–5.2)
Sodium: 143 mmol/L (ref 134–144)

## 2017-09-14 NOTE — Progress Notes (Signed)
Internal Medicine Clinic Attending  Case discussed with Dr. Hoffman at the time of the visit.  We reviewed the resident's history and exam and pertinent patient test results.  I agree with the assessment, diagnosis, and plan of care documented in the resident's note.  

## 2017-09-15 ENCOUNTER — Telehealth: Payer: Self-pay

## 2017-09-15 NOTE — Telephone Encounter (Signed)
Requesting lab results. Please call pt back.  

## 2017-09-15 NOTE — Telephone Encounter (Signed)
-----   Message from Valinda Party, [place] sent at 09/15/2017  9:26 AM EST ----- I attempted to call Ms roberts about her potassium level but mailbox is full.  Could you please let her know it is 4 and wnl thanks  ----- Message ----- From: Interface, [facility] Lab Results In Sent: 09/14/2017   5:40 AM To: Valinda Party, DO

## 2017-09-15 NOTE — Telephone Encounter (Signed)
rtc to pt, gave her dr Jodene Nam message, reassured her, ask her to call for any concerns or needs, she was agreeable

## 2017-09-19 ENCOUNTER — Ambulatory Visit (INDEPENDENT_AMBULATORY_CARE_PROVIDER_SITE_OTHER): Payer: Medicare Other | Admitting: Psychiatry

## 2017-09-19 DIAGNOSIS — F4323 Adjustment disorder with mixed anxiety and depressed mood: Secondary | ICD-10-CM | POA: Diagnosis not present

## 2017-09-20 ENCOUNTER — Encounter: Payer: Self-pay | Admitting: Internal Medicine

## 2017-09-20 ENCOUNTER — Ambulatory Visit: Payer: Self-pay

## 2017-09-20 ENCOUNTER — Telehealth: Payer: Self-pay | Admitting: Internal Medicine

## 2017-09-20 ENCOUNTER — Ambulatory Visit (INDEPENDENT_AMBULATORY_CARE_PROVIDER_SITE_OTHER): Payer: Medicare Other | Admitting: Internal Medicine

## 2017-09-20 VITALS — BP 144/78 | HR 86 | Ht 63.0 in | Wt 178.0 lb

## 2017-09-20 DIAGNOSIS — K648 Other hemorrhoids: Secondary | ICD-10-CM

## 2017-09-20 DIAGNOSIS — R1084 Generalized abdominal pain: Secondary | ICD-10-CM

## 2017-09-20 DIAGNOSIS — K602 Anal fissure, unspecified: Secondary | ICD-10-CM | POA: Diagnosis not present

## 2017-09-20 DIAGNOSIS — K219 Gastro-esophageal reflux disease without esophagitis: Secondary | ICD-10-CM

## 2017-09-20 MED ORDER — AMBULATORY NON FORMULARY MEDICATION: 0 refills | Status: DC

## 2017-09-20 MED ORDER — HYDROCORTISONE ACETATE 25 MG RE SUPP: 25.0000 mg | Freq: Two times a day (BID) | RECTAL | 0 refills | Status: DC

## 2017-09-20 NOTE — Progress Notes (Signed)
Subjective:    Patient ID: [name], female    DOB: [birth year], 67 y.o.   MRN: [id number]  HPI [name] is a 67 year old female with a history of IBS, history of constipation with abdominal bloating, history of gastritis and Candida esophagitis who is here for follow-up.  She was last seen on 02/16/2017.  She has a history of CAD, remote breast cancer status post chemo and radiation therapy, hypertension, cervical and lumbar disc disease with spinal stenosis and chronic pain.  Today she reports that she has had some issues with red blood in her stools as well as severe perianal itching.  She does not feel hemorrhoids but has been using over-the-counter Preparation H cream externally with only mild improvement.  She also reports separate bilateral lower abdominal cramping and spasm which can become quite severe.  She reports that she has been having alternating bowel habits which she reports has become more normal for her.  This can consist of some days loose stools other days no stool at all and then other times constipation.  She does continue to have abdominal bloating.  The lower abdominal cramping is at times worse in the middle of the night.  From a reflux perspective her heartburn has been better controlled of late.  She is using pantoprazole 40 mg once daily.  When we increased it to twice daily she developed diarrhea and loose stools.  She was treated in early 2018 for IBS D with rifaximin and had improvement in lower abdominal pain, bloating and loose stools at that time.  She had a Cologuard in February 2018 ordered by primary care which was negative.  Her last colonoscopy in 2013 showed moderate diverticulosis but was otherwise normal.  10-year recall was recommended.  She has been seen recently by Dr. Leta Baptist with neurology to evaluate neck pain, left arm numbness and tremor.  She is also recently had an MRI showing spinal stenosis in the cervical spine.  Her cervical spinal  stenosis was being treated conservatively at first with physical and massage therapy.  Neurosurgery consultation was planned if symptoms were persistent or worsening.  Review of Systems  As per HPI, otherwise negative  Current Medications, Allergies, Past Medical History, Past Surgical History, Family History and Social History were reviewed in [employer] record.      Objective:   Physical Exam BP (!) 144/78   Pulse 86   Ht 5\' 3"  (1.6 m)   Wt 178 lb (80.7 kg)   SpO2 96%   BMI 31.53 kg/m  Constitutional: Well-developed and well-nourished. No distress. HEENT: Normocephalic and atraumatic. Oropharynx is clear and moist. Conjunctivae are normal.  No scleral icterus. Neck: Neck supple. Trachea midline. Cardiovascular: Normal rate, regular rhythm and intact distal pulses. No M/R/G Pulmonary/chest: Effort normal and breath sounds normal. No wheezing, rales or rhonchi. Abdominal: Soft, diffuse moderate abdominal pain in the upper and lower abdomen with voluntary guarding but no rebound tenderness, nondistended. Bowel sounds active throughout. There are no masses palpable. No hepatosplenomegaly. Rectal exam with anoscopy: No external lesions, tenderness in the posterior midline with visible fairly superficial anal fissure, internal hemorrhoids were seen.  No masses.  Soft brown heme-negative stool in the vault Extremities: no clubbing, cyanosis, or edema Neurological: Alert and oriented to person place and time. Skin: Skin is warm and dry. Psychiatric: Normal mood and affect. Behavior is normal.  CBC    Component Value Date/Time   WBC 11.6 (H) 09/03/2017 1721   RBC  4.07 09/03/2017 1721   HGB 12.5 09/03/2017 1721   HGB 12.9 06/11/2015 1057   HCT 37.0 09/03/2017 1721   HCT 38.5 06/11/2015 1057   PLT 171 09/03/2017 1721   PLT 184 06/11/2015 1057   MCV 90.9 09/03/2017 1721   MCV 89.8 06/11/2015 1057   MCH 30.7 09/03/2017 1721   MCHC 33.8 09/03/2017 1721   RDW  13.4 09/03/2017 1721   RDW 13.7 06/11/2015 1057   LYMPHSABS 1.7 09/03/2017 1721   LYMPHSABS 1.2 06/11/2015 1057   MONOABS 0.8 09/03/2017 1721   MONOABS 0.3 06/11/2015 1057   EOSABS 0.0 09/03/2017 1721   EOSABS 0.1 06/11/2015 1057   BASOSABS 0.0 09/03/2017 1721   BASOSABS 0.0 06/11/2015 1057   CMP     Component Value Date/Time   NA 143 09/13/2017 1429   NA 142 06/11/2015 1057   K 4.0 09/13/2017 1429   K 3.7 06/11/2015 1057   CL 105 09/13/2017 1429   CL 104 12/07/2012 1341   CO2 25 09/13/2017 1429   CO2 29 06/11/2015 1057   GLUCOSE 107 (H) 09/13/2017 1429   GLUCOSE 116 (H) 09/03/2017 1721   GLUCOSE 113 06/11/2015 1057   GLUCOSE 110 (H) 12/07/2012 1341   BUN 12 09/13/2017 1429   BUN 15.4 06/11/2015 1057   CREATININE 0.75 09/13/2017 1429   CREATININE 0.8 06/11/2015 1057   CALCIUM 9.9 09/13/2017 1429   CALCIUM 9.8 06/11/2015 1057   PROT 7.0 09/03/2017 1721   PROT 6.5 06/11/2015 1057   ALBUMIN 4.1 09/03/2017 1721   ALBUMIN 3.9 06/11/2015 1057   AST 20 09/03/2017 1721   AST 24 06/11/2015 1057   ALT 17 09/03/2017 1721   ALT 25 06/11/2015 1057   ALKPHOS 55 09/03/2017 1721   ALKPHOS 55 06/11/2015 1057   BILITOT 0.9 09/03/2017 1721   BILITOT 0.59 06/11/2015 1057   GFRNONAA 85 09/13/2017 1429   GFRNONAA 63 12/23/2014 1035   GFRAA 97 09/13/2017 1429   GFRAA 73 12/23/2014 1035       Assessment & Plan:  67 year old female with a history of IBS, history of constipation with abdominal bloating, history of gastritis and Candida esophagitis who is here for follow-up  1.  Rectal bleeding/anal fissure and internal hemorrhoids --2 problems identified on exam today including anal fissure and internal hemorrhoids.  I am going to treat her with Anusol HC suppository 25 mg twice daily times 3-5 days.  Also nitroglycerin ointment, a pea-sized amount 3 times daily to the anal canal for fissure.  This may be necessary for 2-4 weeks until fissure symptoms heal.  2.  Diffuse abdominal pain  --likely irritable bowel but given exam findings today proceeding with CT scan of the abdomen pelvis with oral contrast only.  She has multiple drug allergies including contrast with report of anaphylaxis therefore we are avoiding IV contrast. --Trial of glycopyrrolate 1.5 mg 3 times daily as needed for abdominal crampy pain.  3.  GERD --continue pantoprazole 40 mg once daily for now  4.  CRC screening --normal colonoscopy in 2013, normal Cologuard in 2018.  Recommend repeat screening colonoscopy in 2023  Follow-up in 2-3 months, sooner if necessary  55 minutes spent with the patient today. Greater than 50% was spent in counseling and coordination of care with the patient

## 2017-09-20 NOTE — Telephone Encounter (Signed)
Called pt, appt [facility] at 1545 today

## 2017-09-20 NOTE — Telephone Encounter (Signed)
Patient has a fever and sore throat. She needs to get some medicine. Pls call patient

## 2017-09-20 NOTE — Patient Instructions (Addendum)
You have been scheduled for a CT scan of the abdomen and pelvis at [facility] ([address]---this is in the same building as [facility]).   You are scheduled on Thursday, 09/21/17 at 2:15 pm. You should arrive 15 minutes prior to your appointment time for registration. Please follow the written instructions below on the day of your exam:  1) Do not eat or drink anything after 10:15 am (4 hours prior to your test) 2) You have been given 2 bottles of oral contrast to drink. The solution may taste               better if refrigerated, but do NOT add ice or any other liquid to this solution. Shake             well before drinking.    Drink 1 bottle of contrast @ 12:15 pm (2 hours prior to your exam)  Drink 1 bottle of contrast @ 1:15 pm (1 hour prior to your exam)  You may take any medications as prescribed with a small amount of water except for the following: Metformin, Glucophage, Glucovance, Avandamet, Riomet, Fortamet, Actoplus Met, Janumet, Glumetza or Metaglip. The above medications must be held the day of the exam AND 48 hours after the exam.  The purpose of you drinking the oral contrast is to aid in the visualization of your intestinal tract. The contrast solution may cause some diarrhea. Before your exam is started, you will be given a small amount of fluid to drink. Depending on your individual set of symptoms, you may also receive an intravenous injection of x-ray contrast/dye. Plan on being at [facility] for 30 minutes or longer, depending on the type of exam you are having performed.  This test typically takes 30-45 minutes to complete.  If you have any questions regarding your exam or if you need to reschedule, you may call the CT department at [phone] between the hours of 8:00 am and 5:00 pm, Monday-Friday.  _______________________________________________________________________  We have sent the following medications to your pharmacy for you to  pick up at your convenience: Anusol HC Suppositories  We have sent a prescription for nitroglycerin 0.125% gel to [facility] You should apply a pea size amount to your rectum three times daily x 6-8 weeks.  [facility]'s information is below: Address: [place], [place], [place] [address]  [phone]  Please follow up with Dr Hilarie Fredrickson on Friday, 12/01/17 at 2:00 pm.  We have given you robinul samples to take twice daily for lower abdominal cramping.  If you are age 42 or older, your body mass index should be between 23-30. Your Body mass index is 31.53 kg/m. If this is out of the aforementioned range listed, please consider follow up with your Primary Care Provider.  If you are age 77 or younger, your body mass index should be between 19-25. Your Body mass index is 31.53 kg/m. If this is out of the aformentioned range listed, please consider follow up with your Primary Care Provider.

## 2017-09-21 ENCOUNTER — Ambulatory Visit (INDEPENDENT_AMBULATORY_CARE_PROVIDER_SITE_OTHER): Payer: Medicare Other | Admitting: Internal Medicine

## 2017-09-21 ENCOUNTER — Encounter: Payer: Self-pay | Admitting: Internal Medicine

## 2017-09-21 ENCOUNTER — Ambulatory Visit (INDEPENDENT_AMBULATORY_CARE_PROVIDER_SITE_OTHER)
Admission: RE | Admit: 2017-09-21 | Discharge: 2017-09-21 | Disposition: A | Discharge status: Home / Self Care | Payer: Medicare Other | Source: Ambulatory Visit | Attending: Internal Medicine | Admitting: Internal Medicine

## 2017-09-21 ENCOUNTER — Telehealth: Payer: Self-pay | Admitting: Internal Medicine

## 2017-09-21 DIAGNOSIS — Z79899 Other long term (current) drug therapy: Secondary | ICD-10-CM | POA: Diagnosis not present

## 2017-09-21 DIAGNOSIS — J322 Chronic ethmoidal sinusitis: Secondary | ICD-10-CM

## 2017-09-21 DIAGNOSIS — I10 Essential (primary) hypertension: Secondary | ICD-10-CM

## 2017-09-21 DIAGNOSIS — J029 Acute pharyngitis, unspecified: Secondary | ICD-10-CM

## 2017-09-21 DIAGNOSIS — J020 Streptococcal pharyngitis: Secondary | ICD-10-CM | POA: Diagnosis present

## 2017-09-21 DIAGNOSIS — F419 Anxiety disorder, unspecified: Secondary | ICD-10-CM | POA: Diagnosis not present

## 2017-09-21 DIAGNOSIS — J45909 Unspecified asthma, uncomplicated: Secondary | ICD-10-CM

## 2017-09-21 DIAGNOSIS — Z87891 Personal history of nicotine dependence: Secondary | ICD-10-CM | POA: Diagnosis not present

## 2017-09-21 DIAGNOSIS — B955 Unspecified streptococcus as the cause of diseases classified elsewhere: Secondary | ICD-10-CM | POA: Diagnosis not present

## 2017-09-21 DIAGNOSIS — K219 Gastro-esophageal reflux disease without esophagitis: Secondary | ICD-10-CM

## 2017-09-21 DIAGNOSIS — K5730 Diverticulosis of large intestine without perforation or abscess without bleeding: Secondary | ICD-10-CM | POA: Diagnosis not present

## 2017-09-21 DIAGNOSIS — R1084 Generalized abdominal pain: Secondary | ICD-10-CM | POA: Diagnosis not present

## 2017-09-21 DIAGNOSIS — Z7982 Long term (current) use of aspirin: Secondary | ICD-10-CM

## 2017-09-21 MED ORDER — POTASSIUM CHLORIDE ER 10 MEQ PO TBCR: EXTENDED_RELEASE_TABLET | ORAL | 0 refills | Status: DC

## 2017-09-21 NOTE — Progress Notes (Signed)
Internal Medicine Clinic Attending  Case discussed with Dr. Patel at the time of the visit.  We reviewed the resident's history and exam and pertinent patient test results.  I agree with the assessment, diagnosis, and plan of care documented in the resident's note.  

## 2017-09-21 NOTE — Telephone Encounter (Signed)
Dr Hilarie Fredrickson, please advise... Would you like me to have her try preparation H suppositories otc or Ansuol cream?
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
## 2017-09-21 NOTE — Patient Instructions (Addendum)
It was a pleasure to see you [name].  For your sore throat, try the following: - Netipot for sinus irrigation - Hot tea with honey - Ice cubes/popsicles to soothe the throat - Cough drops  Continue your Flonase and Zyrtec.  We will change your potassium pill to one that you can break in half or dissolve as your current pills can cause irritation.  For the Klor-Con (new potassium pill): Tablet may be broken in half and each half swallowed separately; the whole tablet may be dissolved in ~4 ounces of water (allow ~2 minutes to dissolve, stir well and drink immediately).  Please follow up with Dr. Tarri Abernethy as scheduled or see [place] sooner if needed.

## 2017-09-21 NOTE — Telephone Encounter (Signed)
Prep H suppository per box instructions

## 2017-09-21 NOTE — Telephone Encounter (Signed)
Patient advised to get preparation H suppositories over the counter and use as per box instructions in place of Anusol suppositories. She verbalizes understanding.

## 2017-09-21 NOTE — Assessment & Plan Note (Signed)
[name] was diagnosed and treated for Strep pharyngitis on 09/03/17 with 1 week of Clindamycin. She reports her symptoms improved but still has persistent sore throat which is less intense than before. She also reports chronic sinus issues which are unchanged. She denies any difficulty with swallowing foods or liquids. She has intermittent cough and reports coughing a lot yesterday but no coughing at all today.   She has been using Flonase, Zyrtec, and an all natural herb with honey for her allergies and sore throat. She takes pantoprazole for reflux. She has also been taking potassium chloride (K-DUR) two tablets every other day with last dose yesterday. She has been seen in consultation by ENT, Allergy Medicine, and GI previously.  A/P: She has continued but improved sore throat after recent strep pharyngitis episode which has been treated. She does not have any alarm symptoms or examination findings to suggest persistent bacterial infection, abscess, or candidal disease of the oropharynx. Her sore throat may be related to her chronic sinus issues with post-nasal drip, GERD, or medication (K-DUR).   I have recommended continuing her Zyrtec and Flonase for allergies and trying sinus irrigation (netipot) for her chronic sinusitis. She will also try cough drops, hot tea with honey, and ice cubes/popsicles to soothe the throat. In addition we will change her potassium supplement to KlOR-CON which she can either cut in half or dissolve the whole tablet for easier digestion and less esophageal irritation. She is agreeable to this plan.

## 2017-09-22 ENCOUNTER — Other Ambulatory Visit: Payer: Self-pay | Admitting: Internal Medicine

## 2017-09-22 NOTE — Telephone Encounter (Signed)
Requesting 90 days supply. Thanks

## 2017-09-25 ENCOUNTER — Other Ambulatory Visit: Payer: Self-pay | Admitting: Internal Medicine

## 2017-09-26 ENCOUNTER — Ambulatory Visit (INDEPENDENT_AMBULATORY_CARE_PROVIDER_SITE_OTHER): Payer: Medicare Other | Admitting: Psychiatry

## 2017-09-26 DIAGNOSIS — F4323 Adjustment disorder with mixed anxiety and depressed mood: Secondary | ICD-10-CM | POA: Diagnosis not present

## 2017-09-27 ENCOUNTER — Other Ambulatory Visit: Payer: Self-pay | Admitting: Student in an Organized Health Care Education/Training Program

## 2017-09-27 ENCOUNTER — Telehealth: Payer: Self-pay | Admitting: Diagnostic Neuroimaging

## 2017-09-27 DIAGNOSIS — H1045 Other chronic allergic conjunctivitis: Secondary | ICD-10-CM | POA: Diagnosis not present

## 2017-09-27 DIAGNOSIS — M79605 Pain in left leg: Principal | ICD-10-CM

## 2017-09-27 DIAGNOSIS — M5442 Lumbago with sciatica, left side: Secondary | ICD-10-CM

## 2017-09-27 DIAGNOSIS — J4540 Moderate persistent asthma, uncomplicated: Secondary | ICD-10-CM | POA: Diagnosis not present

## 2017-09-27 DIAGNOSIS — G8929 Other chronic pain: Secondary | ICD-10-CM

## 2017-09-27 DIAGNOSIS — M5441 Lumbago with sciatica, right side: Secondary | ICD-10-CM

## 2017-09-27 DIAGNOSIS — R05 Cough: Secondary | ICD-10-CM | POA: Diagnosis not present

## 2017-09-27 DIAGNOSIS — K219 Gastro-esophageal reflux disease without esophagitis: Secondary | ICD-10-CM | POA: Diagnosis not present

## 2017-09-27 DIAGNOSIS — M79604 Pain in right leg: Secondary | ICD-10-CM

## 2017-09-27 DIAGNOSIS — M4802 Spinal stenosis, cervical region: Secondary | ICD-10-CM

## 2017-09-27 NOTE — Telephone Encounter (Signed)
Spoke to pt and she states that she is in double the pain that she was in when she was here.  She had tried calling PM Dr. Darral Dash and has not heard back.  She is taking 2 ES Tylenol and only getting 5hours of sleep a night due to her pain.  She would like to get referral for NS.  She wanted to bypass the PT and massage at this time.  Please advise.

## 2017-09-27 NOTE — Telephone Encounter (Signed)
LMVM for pt that did place referral for LBP and Cervical stenosis (which we saw you for this last appt) and this was a Neurosurgery referral.  To call back if questions.

## 2017-09-27 NOTE — Telephone Encounter (Signed)
NS consult ordered. -VRP

## 2017-10-04 ENCOUNTER — Telehealth: Payer: Self-pay | Admitting: Internal Medicine

## 2017-10-04 NOTE — Telephone Encounter (Signed)
rtc to pt she states she has a cold and her "fever" is 98.7, she states she has been taking ex strength tylenol, she is advised to take plenty of fluids, speak w/ her pharmacist for assistance of otc medications for comfort of the cold. Do you agree? Pt refused appt

## 2017-10-04 NOTE — Telephone Encounter (Signed)
Patient has a cold, she has been taking care of her mom. She have had sore throat for 3 days. Pls call patient.

## 2017-10-05 ENCOUNTER — Telehealth: Payer: Self-pay

## 2017-10-05 ENCOUNTER — Telehealth: Payer: Self-pay | Admitting: Internal Medicine

## 2017-10-05 ENCOUNTER — Ambulatory Visit: Payer: Self-pay

## 2017-10-05 NOTE — Telephone Encounter (Signed)
Called [facility], they will call [facility] and have script transferred

## 2017-10-05 NOTE — Telephone Encounter (Signed)
Needs refill on losartan 100mg , pharmacy at [facility]

## 2017-10-05 NOTE — Telephone Encounter (Signed)
Thank [name]. I agree.

## 2017-10-05 NOTE — Telephone Encounter (Signed)
error 

## 2017-10-09 ENCOUNTER — Other Ambulatory Visit: Payer: Self-pay

## 2017-10-09 ENCOUNTER — Telehealth: Payer: Self-pay | Admitting: *Deleted

## 2017-10-09 ENCOUNTER — Ambulatory Visit (INDEPENDENT_AMBULATORY_CARE_PROVIDER_SITE_OTHER): Payer: Medicare Other | Admitting: Internal Medicine

## 2017-10-09 VITALS — BP 137/75 | HR 78 | Temp 98.3°F | Ht 63.0 in | Wt 177.9 lb

## 2017-10-09 DIAGNOSIS — Z87891 Personal history of nicotine dependence: Secondary | ICD-10-CM | POA: Diagnosis not present

## 2017-10-09 DIAGNOSIS — J0101 Acute recurrent maxillary sinusitis: Secondary | ICD-10-CM | POA: Diagnosis not present

## 2017-10-09 DIAGNOSIS — Z7982 Long term (current) use of aspirin: Secondary | ICD-10-CM | POA: Diagnosis not present

## 2017-10-09 MED ORDER — DOXYCYCLINE HYCLATE 200 MG PO TBEC: 200.0000 mg | DELAYED_RELEASE_TABLET | Freq: Every day | ORAL | 0 refills | Status: DC

## 2017-10-09 NOTE — Progress Notes (Signed)
Internal Medicine Clinic Attending  Case discussed with Dr. Margee Trentham at the time of the visit.  We reviewed the resident's history and exam and pertinent patient test results.  I agree with the assessment, diagnosis, and plan of care documented in the resident's note.  

## 2017-10-09 NOTE — Telephone Encounter (Signed)
Call from [facility].  Pharmacy does not carry Doxycycline 200 mg [facility] does carry 100 mg.  Wanted to change to 100 mg tablets once daily.  OK per Dr. Heber Tutuilla but will change Doxycycline 100 mg tablets to 1 po BID.  Pharmacy aware and patient to pick up.  Sander Nephew, RN 10/09/2017 12:20 PM.

## 2017-10-09 NOTE — Assessment & Plan Note (Signed)
Assessment: acute sinus infection Patient presents with increased sinus pain that is reproducible when sinuses palpated on exam. She has increased yellow nasal discharge. She states she is using a nasal spray daily. Patient is allergic to amoxicillin. Will treat with doxycycline for 5 days  Plan -Doxycycline 200 mg for 5 days
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
## 2017-10-09 NOTE — Patient Instructions (Addendum)
[name],  Was a pleasure seeing you today. Please start taking doxycycline for 5 days.

## 2017-10-18 DIAGNOSIS — Z6831 Body mass index (BMI) 31.0-31.9, adult: Secondary | ICD-10-CM | POA: Diagnosis not present

## 2017-10-18 DIAGNOSIS — M4802 Spinal stenosis, cervical region: Secondary | ICD-10-CM | POA: Diagnosis not present

## 2017-10-24 ENCOUNTER — Ambulatory Visit (INDEPENDENT_AMBULATORY_CARE_PROVIDER_SITE_OTHER): Payer: Medicare Other | Admitting: Psychiatry

## 2017-10-24 DIAGNOSIS — I10 Essential (primary) hypertension: Secondary | ICD-10-CM | POA: Diagnosis not present

## 2017-10-24 DIAGNOSIS — F4323 Adjustment disorder with mixed anxiety and depressed mood: Secondary | ICD-10-CM

## 2017-10-24 DIAGNOSIS — E669 Obesity, unspecified: Secondary | ICD-10-CM | POA: Diagnosis not present

## 2017-10-24 DIAGNOSIS — E7849 Other hyperlipidemia: Secondary | ICD-10-CM | POA: Diagnosis not present

## 2017-10-24 DIAGNOSIS — Z01810 Encounter for preprocedural cardiovascular examination: Secondary | ICD-10-CM | POA: Diagnosis not present

## 2017-10-26 ENCOUNTER — Other Ambulatory Visit: Payer: Self-pay | Admitting: Neurosurgery

## 2017-10-31 ENCOUNTER — Other Ambulatory Visit: Payer: Self-pay

## 2017-10-31 ENCOUNTER — Encounter: Payer: Self-pay | Admitting: Internal Medicine

## 2017-10-31 ENCOUNTER — Ambulatory Visit (INDEPENDENT_AMBULATORY_CARE_PROVIDER_SITE_OTHER): Payer: Medicare Other | Admitting: Internal Medicine

## 2017-10-31 VITALS — BP 157/71 | HR 79 | Temp 98.4°F | Ht 63.0 in | Wt 181.9 lb

## 2017-10-31 DIAGNOSIS — F17220 Nicotine dependence, chewing tobacco, uncomplicated: Secondary | ICD-10-CM

## 2017-10-31 DIAGNOSIS — Z7982 Long term (current) use of aspirin: Secondary | ICD-10-CM | POA: Diagnosis not present

## 2017-10-31 DIAGNOSIS — R7303 Prediabetes: Secondary | ICD-10-CM | POA: Diagnosis not present

## 2017-10-31 DIAGNOSIS — R51 Headache: Secondary | ICD-10-CM

## 2017-10-31 DIAGNOSIS — R059 Cough, unspecified: Secondary | ICD-10-CM

## 2017-10-31 DIAGNOSIS — R05 Cough: Secondary | ICD-10-CM

## 2017-10-31 DIAGNOSIS — R7309 Other abnormal glucose: Secondary | ICD-10-CM

## 2017-10-31 LAB — POCT GLYCOSYLATED HEMOGLOBIN (HGB A1C): Hemoglobin A1C: 6.1

## 2017-10-31 LAB — GLUCOSE, CAPILLARY: GLUCOSE-CAPILLARY: 113 mg/dL — AB (ref 65–99)

## 2017-10-31 LAB — INFLUENZA PANEL BY PCR (TYPE A & B)
INFLAPCR: NEGATIVE
Influenza B By PCR: NEGATIVE

## 2017-10-31 NOTE — Patient Instructions (Addendum)
Thank you for allowing [place] to provide your care. I will call you with the results of your tests. If things continue to get worse please call me or return to the clinic for further evaluation. Keep your appointment with me on 3/11. Have a wonderful day.

## 2017-10-31 NOTE — Assessment & Plan Note (Signed)
Patient is presenting today with headaches, sore throat/cough, and myalgias of three days duration. She does not believe that she has had fevers at this point. She does have sick contact including being at the nursing home for the past two weeks. At this point based on the diffuse symptoms she is most likely experiencing acute viral illness. We will check in influenza swamped today and if positive start her on Tamiflu. Otherwise I discussed with the patient continuing to monitor her symptoms and if she feels things are worsening to return to the clinic for further evaluation. She voices understanding and agrees with the plan.  Plan: - Influenza swab

## 2017-10-31 NOTE — Assessment & Plan Note (Signed)
Patient endorses signs and symptoms of hypoglycemia that responded to oral intake. We will check hemoglobin A1c today.  Plan: - Check hemoglobin A1c   Addendum: hemoglobin A1c came back at 6.1. This is in the prediabetic range. We'll recheck her A1c at her next visit. She is on metoprolol which places her increased risk for diabetes.
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
## 2017-11-01 ENCOUNTER — Telehealth: Payer: Self-pay | Admitting: Internal Medicine

## 2017-11-01 NOTE — Telephone Encounter (Signed)
Pt would like a call back about her Flu Test on yesterday and BS.

## 2017-11-01 NOTE — Telephone Encounter (Signed)
Pt wants to discuss the A1C and blood sugar results with you

## 2017-11-01 NOTE — Telephone Encounter (Signed)
Called patient about her recent tests. Patient is feeling great today. Discussed the results of her test. She is still concerned about her glucose. States she feels she is hypoglycemic in the AMs. Would like to have her fasting glucose checked. Discussed that we will address this at her next visit.   Discussed signs and symptoms of hypoglycemia and if she continues to feel these symptoms she needs to seek medical attention immediately.

## 2017-11-03 NOTE — Progress Notes (Signed)
Internal Medicine Clinic Attending  I saw and evaluated the patient.  I personally confirmed the key portions of the history and exam documented by Dr. Tarri Abernethy and I reviewed pertinent patient test results.  The assessment, diagnosis, and plan were formulated together and I agree with the documentation in the resident's note. I was present when Dr Tarri Abernethy performed nasopharyngeal swab.

## 2017-11-03 NOTE — Pre-Procedure Instructions (Signed)
[name]  11/03/2017      [facility] [place], [place] - [address] [place] [address] Phone: [phone] Fax: [phone]    Your procedure is scheduled on Friday March 1.  Report to [facility] Admitting at 8:30 A.M.  Call this number if you have problems the morning of surgery:  [phone]   Remember:  Do not eat food or drink liquids after midnight.  Take these medicines the morning of surgery with A SIP OF WATER:   Metoprolol (lopressor) Pantoprazole (protonix) Cetirizine (zyrtec) Acetaminophen (tylenol) if needed Alprazolam (Xanax) if needed Flonase if needed BREO Ellipta Albuterol if needed (please bring inhaler to hospital with you)  7 days prior to surgery STOP taking any Aleve, Naproxen, Ibuprofen, Motrin, Advil, Goody's, BC's, all herbal medications, fish oil, and all vitamins  **Follow your surgeon's instructions on stopping Aspirin. If no instructions were given, please call your surgeon's office**    Do not wear jewelry, make-up or nail polish.  Do not wear lotions, powders, or perfumes, or deodorant.  Do not shave 48 hours prior to surgery.  Men may shave face and neck.  Do not bring valuables to the hospital.  [facility] is not responsible for any belongings or valuables.  Contacts, dentures or bridgework may not be worn into surgery.  Leave your suitcase in the car.  After surgery it may be brought to your room.  For patients admitted to the hospital, discharge time will be determined by your treatment team.  Patients discharged the day of surgery will not be allowed to drive home.   Special instructions:    [facility]- Preparing For Surgery  Before surgery, you can play an important role. Because skin is not sterile, your skin needs to be as free of germs as possible. You can reduce the number of germs on your skin by washing with CHG (chlorahexidine gluconate) Soap  before surgery.  CHG is an antiseptic cleaner which kills germs and bonds with the skin to continue killing germs even after washing.  Please do not use if you have an allergy to CHG or antibacterial soaps. If your skin becomes reddened/irritated stop using the CHG.  Do not shave (including legs and underarms) for at least 48 hours prior to first CHG shower. It is OK to shave your face.  Please follow these instructions carefully.   1. Shower the NIGHT BEFORE SURGERY and the MORNING OF SURGERY with CHG.   2. If you chose to wash your hair, wash your hair first as usual with your normal shampoo.  3. After you shampoo, rinse your hair and body thoroughly to remove the shampoo.  4. Use CHG as you would any other liquid soap. You can apply CHG directly to the skin and wash gently with a scrungie or a clean washcloth.   5. Apply the CHG Soap to your body ONLY FROM THE NECK DOWN.  Do not use on open wounds or open sores. Avoid contact with your eyes, ears, mouth and genitals (private parts). Wash Face and genitals (private parts)  with your normal soap.  6. Wash thoroughly, paying special attention to the area where your surgery will be performed.  7. Thoroughly rinse your body with warm water from the neck down.  8. DO NOT shower/wash with your normal soap after using and rinsing off the CHG Soap.  9. Pat yourself dry with a CLEAN TOWEL.  10.  Wear CLEAN PAJAMAS to bed the night before surgery, wear comfortable clothes the morning of surgery  11. Place CLEAN SHEETS on your bed the night of your first shower and DO NOT SLEEP WITH PETS.    Day of Surgery: Do not apply any deodorants/lotions. Please wear clean clothes to the hospital/surgery center.      Please read over the following fact sheets that you were given. Coughing and Deep Breathing, MRSA Information and Surgical Site Infection Prevention

## 2017-11-06 ENCOUNTER — Encounter (HOSPITAL_COMMUNITY): Payer: Self-pay

## 2017-11-06 ENCOUNTER — Other Ambulatory Visit: Payer: Self-pay

## 2017-11-06 ENCOUNTER — Encounter (HOSPITAL_COMMUNITY)
Admission: RE | Admit: 2017-11-06 | Discharge: 2017-11-06 | Disposition: A | Discharge status: Home / Self Care | Payer: Medicare Other | Source: Ambulatory Visit | Attending: Neurosurgery | Admitting: Neurosurgery

## 2017-11-06 DIAGNOSIS — Z01812 Encounter for preprocedural laboratory examination: Secondary | ICD-10-CM | POA: Insufficient documentation

## 2017-11-06 HISTORY — DX: Cerebral infarction, unspecified: I63.9

## 2017-11-06 LAB — CBC WITH DIFFERENTIAL/PLATELET
Basophils Absolute: 0 10*3/uL (ref 0.0–0.1)
Basophils Relative: 0 %
EOS PCT: 2 %
Eosinophils Absolute: 0.1 10*3/uL (ref 0.0–0.7)
HCT: 37.9 % (ref 36.0–46.0)
Hemoglobin: 12.2 g/dL (ref 12.0–15.0)
LYMPHS ABS: 1.8 10*3/uL (ref 0.7–4.0)
LYMPHS PCT: 39 %
MCH: 29.3 pg (ref 26.0–34.0)
MCHC: 32.2 g/dL (ref 30.0–36.0)
MCV: 91.1 fL (ref 78.0–100.0)
MONO ABS: 0.3 10*3/uL (ref 0.1–1.0)
Monocytes Relative: 6 %
Neutro Abs: 2.6 10*3/uL (ref 1.7–7.7)
Neutrophils Relative %: 53 %
PLATELETS: 207 10*3/uL (ref 150–400)
RBC: 4.16 MIL/uL (ref 3.87–5.11)
RDW: 14.3 % (ref 11.5–15.5)
WBC: 4.7 10*3/uL (ref 4.0–10.5)

## 2017-11-06 LAB — SURGICAL PCR SCREEN
MRSA, PCR: NEGATIVE
STAPHYLOCOCCUS AUREUS: NEGATIVE

## 2017-11-06 LAB — BASIC METABOLIC PANEL
Anion gap: 8 (ref 5–15)
BUN: 18 mg/dL (ref 6–20)
CO2: 25 mmol/L (ref 22–32)
Calcium: 9.9 mg/dL (ref 8.9–10.3)
Chloride: 107 mmol/L (ref 101–111)
Creatinine, Ser: 0.76 mg/dL (ref 0.44–1.00)
GFR calc Af Amer: >60 mL/min (ref >60)
GFR calc non Af Amer: >60 mL/min (ref >60)
GLUCOSE: 108 mg/dL — AB (ref 65–99)
POTASSIUM: 4.1 mmol/L (ref 3.5–5.1)
Sodium: 140 mmol/L (ref 135–145)

## 2017-11-06 NOTE — Progress Notes (Signed)
PCP - Dr. Tarri Abernethy Cardiologist - Pt reports seeing Dr. Verl Blalock in the past. Pt states she recently saw Dr. Wynonia Lawman for heart clearance. Records requested from Dr. Wynonia Lawman  Chest x-ray - 08/24/17 EKG - 08/24/17 Stress Test - 2003 ECHO - 05/04/16 Cardiac Cath - pt reports cath at [facility] in 2005  Aspirin Instructions: Pt reports she stopped Aspirin, Vitamin E already per MD's instructions.   Anesthesia review: heart history, heart clearance?  Patient denies shortness of breath, fever, cough and chest pain at PAT appointment   Patient verbalized understanding of instructions that were given to them at the PAT appointment. Patient was also instructed that they will need to review over the PAT instructions again at home before surgery.

## 2017-11-06 NOTE — Progress Notes (Signed)
   11/06/17 1213  OBSTRUCTIVE SLEEP APNEA  Have you ever been diagnosed with sleep apnea through a sleep study? No  Do you snore loudly (loud enough to be heard through closed doors)?  1  Do you often feel tired, fatigued, or sleepy during the daytime (such as falling asleep during driving or talking to someone)? 1  Has anyone observed you stop breathing during your sleep? 0  Do you have, or are you being treated for high blood pressure? 1  BMI more than 35 kg/m2? 0  Age > 50 (1-yes) 1  Neck circumference greater than:Female 16 inches or larger, Female 17inches or larger? 1  Female Gender (Yes=1) 0  Obstructive Sleep Apnea Score 5  Score 5 or greater  Results sent to PCP

## 2017-11-07 NOTE — Progress Notes (Signed)
Anesthesia Chart Review:  Pt is a 67 year old female scheduled for C3-4, C4-5, C6-7 ACDF on 11/10/2017 with Earnie Larsson, MD  - PCP is Ina Homes, MD - Neurologist is Andrey Spearman, MD who referred pt to neurosurgery - Saw cardiologist Tollie Eth, MD for pre-op eval 10/24/17 and cleared for surgery.   PMH includes:  CAD (nonobstructive by 2005 cath), stroke, HTN, asthma, breast cancer, GERD.  Former smoker (quit in 1985).  BMI 32.  Medications include: Albuterol, ASA 81 mg, Breo Ellipta, losartan, metoprolol, potassium, pravastatin  BP (!) 166/84   Pulse 82   Temp 36.8 C   Resp 20   Ht 5\' 3"  (1.6 m)   Wt 179 lb (81.2 kg)   SpO2 100%   BMI 31.71 kg/m   Preoperative labs reviewed.   - Glucose 108. HbA1c was 6.1 on 10/31/17  CXR 09/03/17: No active cardiopulmonary disease.  EKG 09/03/17: Sinus tachycardia (111 bpm). Low voltage, precordial leads. Baseline wander in lead(s) II III aVR aVL aVF V1 V3  MRI brain 09/03/17:  1. No acute intracranial abnormality. 2. Stable nonspecific T2/FLAIR hyperintensities involving the supratentorial cerebral white matter, most like related chronic small vessel ischemic changes.  Echo 05/04/16:  - Left ventricle: The cavity size was normal. There was moderate concentric hypertrophy. Systolic function was normal. The estimated ejection fraction was in the range of 60% to 65%. Wall motion was normal; there were no regional wall motion abnormalities. Doppler parameters are consistent with abnormal left ventricular relaxation (grade 1 diastolic dysfunction). - Aortic valve: Trileaflet; normal thickness leaflets. There was no regurgitation. - Aortic root: The aortic root was normal in size. - Mitral valve: There was no regurgitation. - Left atrium: The atrium was normal in size. - Right ventricle: Systolic function was normal. - Right atrium: The atrium was normal in size. - Tricuspid valve: There was mild regurgitation. - Pulmonic valve: There  was no regurgitation. - Pulmonary arteries: Systolic pressure was within the normal range. - Inferior vena cava: The vessel was normal in size. - Pericardium, extracardiac: There was no pericardial effusion. - Impressions: No cardiac source of emboli was indentified.  Carotid duplex 05/03/16:  - Findings are consistent with a 1-39 percent stenosis involving the right internal carotid artery and the left internal carotid artery. - The vertebral arteries demonstrate antegrade flow.  Cardiac cath 04/06/04:  1. Normal left ventricular and E and D diastolic pressure.  2. Mild CAD (LAD: tubular 40% stenosis; CX: ostial/proximal 25% stenosis), which is not hemodynamically significant.  It does not appear to be changed significantly when compared to a previous catheterization.  If no changes, I anticipate pt can proceed with surgery as scheduled.   Willeen Cass, FNP-BC [facility]/Anesthesiology Phone: [phone] 11/07/2017 4:33 PM

## 2017-11-10 ENCOUNTER — Inpatient Hospital Stay (HOSPITAL_COMMUNITY)
Admission: RE | Admit: 2017-11-10 | Discharge: 2017-11-11 | DRG: 472 | Disposition: A | Discharge status: Home / Self Care | Payer: Medicare HMO | Source: Ambulatory Visit | Attending: Neurosurgery | Admitting: Neurosurgery

## 2017-11-10 ENCOUNTER — Inpatient Hospital Stay (HOSPITAL_COMMUNITY): Payer: Medicare HMO | Admitting: Certified Registered Nurse Anesthetist

## 2017-11-10 ENCOUNTER — Encounter (HOSPITAL_COMMUNITY): Payer: Self-pay

## 2017-11-10 ENCOUNTER — Inpatient Hospital Stay (HOSPITAL_COMMUNITY): Payer: Medicare HMO | Admitting: Emergency Medicine

## 2017-11-10 ENCOUNTER — Inpatient Hospital Stay (HOSPITAL_COMMUNITY)
Admission: RE | Disposition: A | Discharge status: Home / Self Care | Payer: Self-pay | Source: Ambulatory Visit | Attending: Neurosurgery

## 2017-11-10 ENCOUNTER — Inpatient Hospital Stay (HOSPITAL_COMMUNITY): Payer: Medicare HMO

## 2017-11-10 DIAGNOSIS — Z79899 Other long term (current) drug therapy: Secondary | ICD-10-CM | POA: Diagnosis not present

## 2017-11-10 DIAGNOSIS — Z8673 Personal history of transient ischemic attack (TIA), and cerebral infarction without residual deficits: Secondary | ICD-10-CM | POA: Diagnosis not present

## 2017-11-10 DIAGNOSIS — M4802 Spinal stenosis, cervical region: Secondary | ICD-10-CM | POA: Diagnosis not present

## 2017-11-10 DIAGNOSIS — J4520 Mild intermittent asthma, uncomplicated: Secondary | ICD-10-CM | POA: Diagnosis not present

## 2017-11-10 DIAGNOSIS — Z972 Presence of dental prosthetic device (complete) (partial): Secondary | ICD-10-CM | POA: Diagnosis not present

## 2017-11-10 DIAGNOSIS — Z87891 Personal history of nicotine dependence: Secondary | ICD-10-CM

## 2017-11-10 DIAGNOSIS — Z7951 Long term (current) use of inhaled steroids: Secondary | ICD-10-CM | POA: Diagnosis not present

## 2017-11-10 DIAGNOSIS — Z90711 Acquired absence of uterus with remaining cervical stump: Secondary | ICD-10-CM | POA: Diagnosis not present

## 2017-11-10 DIAGNOSIS — I10 Essential (primary) hypertension: Secondary | ICD-10-CM | POA: Diagnosis present

## 2017-11-10 DIAGNOSIS — K219 Gastro-esophageal reflux disease without esophagitis: Secondary | ICD-10-CM | POA: Diagnosis not present

## 2017-11-10 DIAGNOSIS — G959 Disease of spinal cord, unspecified: Secondary | ICD-10-CM | POA: Diagnosis present

## 2017-11-10 DIAGNOSIS — Z9842 Cataract extraction status, left eye: Secondary | ICD-10-CM

## 2017-11-10 DIAGNOSIS — F410 Panic disorder [episodic paroxysmal anxiety] without agoraphobia: Secondary | ICD-10-CM | POA: Diagnosis not present

## 2017-11-10 DIAGNOSIS — Z6831 Body mass index (BMI) 31.0-31.9, adult: Secondary | ICD-10-CM

## 2017-11-10 DIAGNOSIS — Z886 Allergy status to analgesic agent status: Secondary | ICD-10-CM

## 2017-11-10 DIAGNOSIS — M50021 Cervical disc disorder at C4-C5 level with myelopathy: Secondary | ICD-10-CM | POA: Diagnosis not present

## 2017-11-10 DIAGNOSIS — K449 Diaphragmatic hernia without obstruction or gangrene: Secondary | ICD-10-CM | POA: Diagnosis not present

## 2017-11-10 DIAGNOSIS — I110 Hypertensive heart disease with heart failure: Secondary | ICD-10-CM | POA: Diagnosis not present

## 2017-11-10 DIAGNOSIS — E876 Hypokalemia: Secondary | ICD-10-CM | POA: Diagnosis not present

## 2017-11-10 DIAGNOSIS — M48061 Spinal stenosis, lumbar region without neurogenic claudication: Secondary | ICD-10-CM | POA: Diagnosis not present

## 2017-11-10 DIAGNOSIS — I2510 Atherosclerotic heart disease of native coronary artery without angina pectoris: Secondary | ICD-10-CM | POA: Diagnosis not present

## 2017-11-10 DIAGNOSIS — Z955 Presence of coronary angioplasty implant and graft: Secondary | ICD-10-CM | POA: Diagnosis not present

## 2017-11-10 DIAGNOSIS — Z91048 Other nonmedicinal substance allergy status: Secondary | ICD-10-CM

## 2017-11-10 DIAGNOSIS — Z8249 Family history of ischemic heart disease and other diseases of the circulatory system: Secondary | ICD-10-CM

## 2017-11-10 DIAGNOSIS — Z9049 Acquired absence of other specified parts of digestive tract: Secondary | ICD-10-CM | POA: Diagnosis not present

## 2017-11-10 DIAGNOSIS — Z91041 Radiographic dye allergy status: Secondary | ICD-10-CM

## 2017-11-10 DIAGNOSIS — Z419 Encounter for procedure for purposes other than remedying health state, unspecified: Secondary | ICD-10-CM

## 2017-11-10 DIAGNOSIS — Z7982 Long term (current) use of aspirin: Secondary | ICD-10-CM

## 2017-11-10 DIAGNOSIS — Z9841 Cataract extraction status, right eye: Secondary | ICD-10-CM

## 2017-11-10 DIAGNOSIS — Z881 Allergy status to other antibiotic agents status: Secondary | ICD-10-CM

## 2017-11-10 DIAGNOSIS — Z882 Allergy status to sulfonamides status: Secondary | ICD-10-CM

## 2017-11-10 DIAGNOSIS — Z880 Allergy status to penicillin: Secondary | ICD-10-CM

## 2017-11-10 DIAGNOSIS — M4712 Other spondylosis with myelopathy, cervical region: Secondary | ICD-10-CM | POA: Diagnosis not present

## 2017-11-10 DIAGNOSIS — E669 Obesity, unspecified: Secondary | ICD-10-CM | POA: Diagnosis not present

## 2017-11-10 DIAGNOSIS — Z888 Allergy status to other drugs, medicaments and biological substances status: Secondary | ICD-10-CM

## 2017-11-10 DIAGNOSIS — M5001 Cervical disc disorder with myelopathy,  high cervical region: Secondary | ICD-10-CM | POA: Diagnosis not present

## 2017-11-10 DIAGNOSIS — I5032 Chronic diastolic (congestive) heart failure: Secondary | ICD-10-CM | POA: Diagnosis not present

## 2017-11-10 HISTORY — PX: ANTERIOR CERVICAL DECOMPRESSION/DISCECTOMY FUSION 4 LEVELS: SHX5556

## 2017-11-10 SURGERY — ANTERIOR CERVICAL DECOMPRESSION/DISCECTOMY FUSION 4 LEVELS
Anesthesia: General

## 2017-11-10 MED ORDER — FENTANYL CITRATE (PF) 100 MCG/2ML IJ SOLN
INTRAMUSCULAR | Status: DC | PRN
  Administered 2017-11-10 (×2): 50 ug via INTRAVENOUS
  Administered 2017-11-10: 100 ug via INTRAVENOUS
  Administered 2017-11-10: 50 ug via INTRAVENOUS

## 2017-11-10 MED ORDER — LIDOCAINE 2% (20 MG/ML) 5 ML SYRINGE
INTRAMUSCULAR | Status: AC
  Filled 2017-11-10: qty 5

## 2017-11-10 MED ORDER — LIDOCAINE 2% (20 MG/ML) 5 ML SYRINGE
INTRAMUSCULAR | Status: DC | PRN
  Administered 2017-11-10: 100 mg via INTRAVENOUS

## 2017-11-10 MED ORDER — LOSARTAN POTASSIUM 50 MG PO TABS: 100.0000 mg | ORAL_TABLET | Freq: Every day | ORAL | Status: DC

## 2017-11-10 MED ORDER — GLYCOPYRROLATE 0.2 MG/ML IV SOSY
PREFILLED_SYRINGE | INTRAVENOUS | Status: DC | PRN
  Administered 2017-11-10 (×2): .1 mg via INTRAVENOUS

## 2017-11-10 MED ORDER — EPHEDRINE 5 MG/ML INJ
INTRAVENOUS | Status: AC
  Filled 2017-11-10: qty 10

## 2017-11-10 MED ORDER — POTASSIUM CHLORIDE ER 10 MEQ PO TBCR
10.0000 meq | EXTENDED_RELEASE_TABLET | Freq: Every day | ORAL | Status: DC
  Filled 2017-11-10 (×2): qty 1

## 2017-11-10 MED ORDER — ROCURONIUM BROMIDE 10 MG/ML (PF) SYRINGE
PREFILLED_SYRINGE | INTRAVENOUS | Status: DC | PRN
  Administered 2017-11-10: 20 mg via INTRAVENOUS
  Administered 2017-11-10: 50 mg via INTRAVENOUS

## 2017-11-10 MED ORDER — ONDANSETRON HCL 4 MG/2ML IJ SOLN
4.0000 mg | Freq: Four times a day (QID) | INTRAMUSCULAR | Status: DC | PRN
  Administered 2017-11-10: 4 mg via INTRAVENOUS
  Filled 2017-11-10: qty 2

## 2017-11-10 MED ORDER — FLUTICASONE FUROATE-VILANTEROL 100-25 MCG/INH IN AEPB
1.0000 | INHALATION_SPRAY | Freq: Every day | RESPIRATORY_TRACT | Status: DC
  Administered 2017-11-11: 08:00:00 1 via RESPIRATORY_TRACT
  Filled 2017-11-10: qty 28

## 2017-11-10 MED ORDER — HEMOSTATIC AGENTS (NO CHARGE) OPTIME
TOPICAL | Status: DC | PRN
  Administered 2017-11-10: 1 via TOPICAL

## 2017-11-10 MED ORDER — CALCIUM CARBONATE-VITAMIN D 500-200 MG-UNIT PO TABS: 1.0000 | ORAL_TABLET | Freq: Every day | ORAL | Status: DC

## 2017-11-10 MED ORDER — PRAVASTATIN SODIUM 40 MG PO TABS: 40.0000 mg | ORAL_TABLET | Freq: Every day | ORAL | Status: DC

## 2017-11-10 MED ORDER — HYDROMORPHONE HCL 1 MG/ML IJ SOLN
INTRAMUSCULAR | Status: AC
  Filled 2017-11-10: qty 1

## 2017-11-10 MED ORDER — THROMBIN 5000 UNITS EX SOLR
CUTANEOUS | Status: AC
  Filled 2017-11-10: qty 5000

## 2017-11-10 MED ORDER — THROMBIN 20000 UNITS EX SOLR
CUTANEOUS | Status: AC
  Filled 2017-11-10: qty 20000

## 2017-11-10 MED ORDER — HYDROCODONE-ACETAMINOPHEN 10-325 MG PO TABS
2.0000 | ORAL_TABLET | ORAL | Status: DC | PRN
  Administered 2017-11-10: 2 via ORAL
  Filled 2017-11-10: qty 2

## 2017-11-10 MED ORDER — METOPROLOL TARTRATE 25 MG PO TABS
50.0000 mg | ORAL_TABLET | Freq: Two times a day (BID) | ORAL | Status: DC
  Administered 2017-11-10: 50 mg via ORAL
  Filled 2017-11-10: qty 2

## 2017-11-10 MED ORDER — VANCOMYCIN HCL IN DEXTROSE 1-5 GM/200ML-% IV SOLN
1000.0000 mg | INTRAVENOUS | Status: AC
  Administered 2017-11-10: 1000 mg via INTRAVENOUS
  Filled 2017-11-10: qty 200

## 2017-11-10 MED ORDER — ONDANSETRON HCL 4 MG/2ML IJ SOLN
INTRAMUSCULAR | Status: AC
  Filled 2017-11-10: qty 2

## 2017-11-10 MED ORDER — 0.9 % SODIUM CHLORIDE (POUR BTL) OPTIME
TOPICAL | Status: DC | PRN
  Administered 2017-11-10: 1000 mL

## 2017-11-10 MED ORDER — CHLORHEXIDINE GLUCONATE CLOTH 2 % EX PADS: 6.0000 | MEDICATED_PAD | Freq: Once | CUTANEOUS | Status: DC

## 2017-11-10 MED ORDER — THROMBIN (RECOMBINANT) 5000 UNITS EX SOLR
OROMUCOSAL | Status: DC | PRN
  Administered 2017-11-10 (×2): via TOPICAL

## 2017-11-10 MED ORDER — ALBUTEROL SULFATE (2.5 MG/3ML) 0.083% IN NEBU: 2.5000 mg | INHALATION_SOLUTION | RESPIRATORY_TRACT | Status: DC | PRN

## 2017-11-10 MED ORDER — PHENYLEPHRINE HCL 10 MG/ML IJ SOLN
INTRAMUSCULAR | Status: DC | PRN
  Administered 2017-11-10: 10 ug/min via INTRAVENOUS

## 2017-11-10 MED ORDER — ALPRAZOLAM 0.5 MG PO TABS: 1.0000 mg | ORAL_TABLET | Freq: Two times a day (BID) | ORAL | Status: DC | PRN

## 2017-11-10 MED ORDER — PROPOFOL 10 MG/ML IV BOLUS
INTRAVENOUS | Status: AC
  Filled 2017-11-10: qty 20

## 2017-11-10 MED ORDER — CYCLOBENZAPRINE HCL 10 MG PO TABS
10.0000 mg | ORAL_TABLET | Freq: Three times a day (TID) | ORAL | Status: DC | PRN
  Administered 2017-11-10 – 2017-11-11 (×2): 10 mg via ORAL
  Filled 2017-11-10 (×2): qty 1

## 2017-11-10 MED ORDER — THROMBIN 5000 UNITS EX SOLR
CUTANEOUS | Status: DC | PRN
  Administered 2017-11-10: 10000 [IU] via TOPICAL

## 2017-11-10 MED ORDER — ACETAMINOPHEN 325 MG PO TABS
650.0000 mg | ORAL_TABLET | ORAL | Status: DC | PRN
  Filled 2017-11-10: qty 2

## 2017-11-10 MED ORDER — THROMBIN (RECOMBINANT) 20000 UNITS EX SOLR
CUTANEOUS | Status: DC | PRN
  Administered 2017-11-10: 12:00:00 via TOPICAL

## 2017-11-10 MED ORDER — HYDROCODONE-ACETAMINOPHEN 5-325 MG PO TABS
1.0000 | ORAL_TABLET | ORAL | Status: DC | PRN
  Administered 2017-11-11 (×2): 1 via ORAL
  Filled 2017-11-10 (×2): qty 1

## 2017-11-10 MED ORDER — HYDROMORPHONE HCL 1 MG/ML IJ SOLN
0.2500 mg | INTRAMUSCULAR | Status: DC | PRN
  Administered 2017-11-10 (×2): 0.5 mg via INTRAVENOUS

## 2017-11-10 MED ORDER — HYDROMORPHONE HCL 1 MG/ML IJ SOLN: 1.0000 mg | INTRAMUSCULAR | Status: DC | PRN

## 2017-11-10 MED ORDER — HYDROCORTISONE ACETATE 25 MG RE SUPP: 25.0000 mg | Freq: Two times a day (BID) | RECTAL | Status: DC

## 2017-11-10 MED ORDER — LACTATED RINGERS IV SOLN
INTRAVENOUS | Status: DC
  Administered 2017-11-10 (×4): via INTRAVENOUS

## 2017-11-10 MED ORDER — VANCOMYCIN HCL IN DEXTROSE 1-5 GM/200ML-% IV SOLN
1000.0000 mg | Freq: Once | INTRAVENOUS | Status: AC
  Administered 2017-11-10: 1000 mg via INTRAVENOUS
  Filled 2017-11-10: qty 200

## 2017-11-10 MED ORDER — ACETAMINOPHEN 650 MG RE SUPP: 650.0000 mg | RECTAL | Status: DC | PRN

## 2017-11-10 MED ORDER — PANTOPRAZOLE SODIUM 40 MG PO TBEC
40.0000 mg | DELAYED_RELEASE_TABLET | Freq: Two times a day (BID) | ORAL | Status: DC
  Administered 2017-11-10 – 2017-11-11 (×2): 40 mg via ORAL

## 2017-11-10 MED ORDER — MENTHOL 3 MG MT LOZG
1.0000 | LOZENGE | OROMUCOSAL | Status: DC | PRN
  Filled 2017-11-10: qty 9

## 2017-11-10 MED ORDER — PHENYLEPHRINE HCL 10 MG/ML IJ SOLN
INTRAMUSCULAR | Status: AC
  Filled 2017-11-10: qty 1

## 2017-11-10 MED ORDER — ROCURONIUM BROMIDE 10 MG/ML (PF) SYRINGE
PREFILLED_SYRINGE | INTRAVENOUS | Status: AC
  Filled 2017-11-10: qty 5

## 2017-11-10 MED ORDER — PROPOFOL 10 MG/ML IV BOLUS
INTRAVENOUS | Status: DC | PRN
  Administered 2017-11-10: 160 mg via INTRAVENOUS
  Administered 2017-11-10: 10 mg via INTRAVENOUS

## 2017-11-10 MED ORDER — VITAMIN E 180 MG (400 UNIT) PO CAPS
400.0000 [IU] | ORAL_CAPSULE | Freq: Every day | ORAL | Status: DC
  Filled 2017-11-10 (×2): qty 1

## 2017-11-10 MED ORDER — DEXAMETHASONE SODIUM PHOSPHATE 10 MG/ML IJ SOLN
INTRAMUSCULAR | Status: AC
  Filled 2017-11-10: qty 1

## 2017-11-10 MED ORDER — THROMBIN 5000 UNITS EX SOLR
CUTANEOUS | Status: AC
  Filled 2017-11-10: qty 10000

## 2017-11-10 MED ORDER — HYPROMELLOSE (GONIOSCOPIC) 2.5 % OP SOLN: 1.0000 [drp] | Freq: Three times a day (TID) | OPHTHALMIC | Status: DC | PRN

## 2017-11-10 MED ORDER — SODIUM CHLORIDE 0.9% FLUSH: 3.0000 mL | INTRAVENOUS | Status: DC | PRN

## 2017-11-10 MED ORDER — SODIUM CHLORIDE 0.9% FLUSH
3.0000 mL | Freq: Two times a day (BID) | INTRAVENOUS | Status: DC
  Administered 2017-11-10: 3 mL via INTRAVENOUS

## 2017-11-10 MED ORDER — ONDANSETRON HCL 4 MG/2ML IJ SOLN
INTRAMUSCULAR | Status: DC | PRN
  Administered 2017-11-10: 4 mg via INTRAVENOUS

## 2017-11-10 MED ORDER — SUGAMMADEX SODIUM 200 MG/2ML IV SOLN
INTRAVENOUS | Status: DC | PRN
  Administered 2017-11-10: 200 mg via INTRAVENOUS

## 2017-11-10 MED ORDER — ONDANSETRON HCL 4 MG PO TABS: 4.0000 mg | ORAL_TABLET | Freq: Four times a day (QID) | ORAL | Status: DC | PRN

## 2017-11-10 MED ORDER — MAGNESIUM CHLORIDE 64 MG PO TBEC
2.0000 | DELAYED_RELEASE_TABLET | Freq: Every day | ORAL | Status: DC
  Filled 2017-11-10: qty 2

## 2017-11-10 MED ORDER — LORATADINE 10 MG PO TABS: 10.0000 mg | ORAL_TABLET | Freq: Every day | ORAL | Status: DC

## 2017-11-10 MED ORDER — NEOSTIGMINE METHYLSULFATE 5 MG/5ML IV SOSY
PREFILLED_SYRINGE | INTRAVENOUS | Status: AC
  Filled 2017-11-10: qty 5

## 2017-11-10 MED ORDER — DIPHENHYDRAMINE HCL 50 MG/ML IJ SOLN
INTRAMUSCULAR | Status: DC | PRN
  Administered 2017-11-10: 12.5 mg via INTRAVENOUS

## 2017-11-10 MED ORDER — MIDAZOLAM HCL 2 MG/2ML IJ SOLN
INTRAMUSCULAR | Status: AC
  Filled 2017-11-10: qty 2

## 2017-11-10 MED ORDER — FLUTICASONE PROPIONATE 50 MCG/ACT NA SUSP
1.0000 | Freq: Every day | NASAL | Status: DC | PRN
  Filled 2017-11-10: qty 16

## 2017-11-10 MED ORDER — NITROGLYCERIN 0.4 MG SL SUBL: 0.4000 mg | SUBLINGUAL_TABLET | SUBLINGUAL | Status: DC | PRN

## 2017-11-10 MED ORDER — SODIUM CHLORIDE 0.9 % IR SOLN
Status: DC | PRN
  Administered 2017-11-10: 12:00:00

## 2017-11-10 MED ORDER — PROMETHAZINE HCL 25 MG/ML IJ SOLN: 6.2500 mg | INTRAMUSCULAR | Status: DC | PRN

## 2017-11-10 MED ORDER — VITAMIN C 500 MG PO TABS: 1000.0000 mg | ORAL_TABLET | Freq: Every day | ORAL | Status: DC

## 2017-11-10 MED ORDER — FENTANYL CITRATE (PF) 250 MCG/5ML IJ SOLN
INTRAMUSCULAR | Status: AC
  Filled 2017-11-10: qty 5

## 2017-11-10 MED ORDER — DEXAMETHASONE SODIUM PHOSPHATE 4 MG/ML IJ SOLN
INTRAMUSCULAR | Status: DC | PRN
  Administered 2017-11-10: 10 mg via INTRAVENOUS

## 2017-11-10 MED ORDER — POLYVINYL ALCOHOL 1.4 % OP SOLN
1.0000 [drp] | Freq: Three times a day (TID) | OPHTHALMIC | Status: DC | PRN
  Filled 2017-11-10: qty 15

## 2017-11-10 MED ORDER — PHENOL 1.4 % MT LIQD
1.0000 | OROMUCOSAL | Status: DC | PRN
Start: 2017-11-10 — End: 2017-11-11

## 2017-11-10 SURGICAL SUPPLY — 64 items
BAG DECANTER FOR FLEXI CONT (MISCELLANEOUS) ×2 IMPLANT
BENZOIN TINCTURE PRP APPL 2/3 (GAUZE/BANDAGES/DRESSINGS) ×2 IMPLANT
BIT DRILL 13 (BIT) ×2 IMPLANT
BUR MATCHSTICK NEURO 3.0 LAGG (BURR) ×2 IMPLANT
CAGE PEEK 6X14X11 (Cage) ×2 IMPLANT
CAGE PEEK 7X14X11 (Cage) ×2 IMPLANT
CAGE SPNL 11X14X6XRADOPQ (Cage) ×2 IMPLANT
CANISTER SUCT 3000ML PPV (MISCELLANEOUS) ×2 IMPLANT
CARTRIDGE OIL MAESTRO DRILL (MISCELLANEOUS) ×1 IMPLANT
DIFFUSER DRILL AIR PNEUMATIC (MISCELLANEOUS) ×2 IMPLANT
DRAPE C-ARM 42X72 X-RAY (DRAPES) ×4 IMPLANT
DRAPE LAPAROTOMY 100X72 PEDS (DRAPES) ×2 IMPLANT
DRAPE MICROSCOPE LEICA (MISCELLANEOUS) ×2 IMPLANT
DRAPE POUCH INSTRU U-SHP 10X18 (DRAPES) IMPLANT
DURAPREP 6ML APPLICATOR 50/CS (WOUND CARE) ×2 IMPLANT
ELECT COATED BLADE 2.86 ST (ELECTRODE) ×2 IMPLANT
ELECT REM PT RETURN 9FT ADLT (ELECTROSURGICAL) ×2
ELECTRODE REM PT RTRN 9FT ADLT (ELECTROSURGICAL) ×1 IMPLANT
GAUZE SPONGE 4X4 12PLY STRL (GAUZE/BANDAGES/DRESSINGS) ×2 IMPLANT
GAUZE SPONGE 4X4 12PLY STRL LF (GAUZE/BANDAGES/DRESSINGS) ×2 IMPLANT
GAUZE SPONGE 4X4 16PLY XRAY LF (GAUZE/BANDAGES/DRESSINGS) ×2 IMPLANT
GLOVE BIOGEL PI IND STRL 7.5 (GLOVE) ×1 IMPLANT
GLOVE BIOGEL PI INDICATOR 7.5 (GLOVE) ×1
GLOVE ECLIPSE 9.0 STRL (GLOVE) ×2 IMPLANT
GLOVE EXAM NITRILE LRG STRL (GLOVE) IMPLANT
GLOVE EXAM NITRILE XL STR (GLOVE) IMPLANT
GLOVE EXAM NITRILE XS STR PU (GLOVE) IMPLANT
GLOVE SURG SS PI 6.5 STRL IVOR (GLOVE) ×2 IMPLANT
GLOVE SURG SS PI 7.5 STRL IVOR (GLOVE) ×2 IMPLANT
GOWN STRL REUS W/ TWL LRG LVL3 (GOWN DISPOSABLE) IMPLANT
GOWN STRL REUS W/ TWL XL LVL3 (GOWN DISPOSABLE) IMPLANT
GOWN STRL REUS W/TWL 2XL LVL3 (GOWN DISPOSABLE) IMPLANT
GOWN STRL REUS W/TWL LRG LVL3 (GOWN DISPOSABLE)
GOWN STRL REUS W/TWL XL LVL3 (GOWN DISPOSABLE)
HALTER HD/CHIN CERV TRACTION D (MISCELLANEOUS) ×2 IMPLANT
HEMOSTAT POWDER KIT SURGIFOAM (HEMOSTASIS) ×4 IMPLANT
KIT BASIN OR (CUSTOM PROCEDURE TRAY) ×2 IMPLANT
KIT ROOM TURNOVER OR (KITS) ×2 IMPLANT
NEEDLE SPNL 20GX3.5 QUINCKE YW (NEEDLE) ×2 IMPLANT
NS IRRIG 1000ML POUR BTL (IV SOLUTION) ×2 IMPLANT
OIL CARTRIDGE MAESTRO DRILL (MISCELLANEOUS) ×2
PACK LAMINECTOMY NEURO (CUSTOM PROCEDURE TRAY) ×2 IMPLANT
PAD ARMBOARD 7.5X6 YLW CONV (MISCELLANEOUS) ×6 IMPLANT
PATTIES SURGICAL 1X1 (DISPOSABLE) ×2 IMPLANT
PLATE 4 77.5XLCK NS SPNE CVD (Plate) ×1 IMPLANT
PLATE 4 ATLANTIS TRANS (Plate) ×1 IMPLANT
RUBBERBAND STERILE (MISCELLANEOUS) ×4 IMPLANT
SCREW ST 13X4.5XST VAR NS (Screw) ×1 IMPLANT
SCREW ST FIX 4 ATL 3120213 (Screw) ×18 IMPLANT
SCREW ST VAR 4.5 ATL (Screw) ×1 IMPLANT
SPACER SPNL 11X14X7XPEEK CVD (Cage) ×2 IMPLANT
SPCR SPNL 11X14X7XPEEK CVD (Cage) ×2 IMPLANT
SPONGE INTESTINAL PEANUT (DISPOSABLE) ×2 IMPLANT
SPONGE SURGIFOAM ABS GEL 100 (HEMOSTASIS) ×2 IMPLANT
SPONGE SURGIFOAM ABS GEL SZ50 (HEMOSTASIS) ×2 IMPLANT
STRIP CLOSURE SKIN 1/2X4 (GAUZE/BANDAGES/DRESSINGS) ×2 IMPLANT
SUT VIC AB 3-0 SH 8-18 (SUTURE) ×2 IMPLANT
SUT VIC AB 4-0 RB1 18 (SUTURE) ×2 IMPLANT
TAPE CLOTH 4X10 WHT NS (GAUZE/BANDAGES/DRESSINGS) ×2 IMPLANT
TAPE CLOTH SURG 4X10 WHT LF (GAUZE/BANDAGES/DRESSINGS) ×2 IMPLANT
TOWEL GREEN STERILE (TOWEL DISPOSABLE) ×2 IMPLANT
TOWEL GREEN STERILE FF (TOWEL DISPOSABLE) ×2 IMPLANT
TRAP SPECIMEN MUCOUS 40CC (MISCELLANEOUS) ×2 IMPLANT
WATER STERILE IRR 1000ML POUR (IV SOLUTION) ×2 IMPLANT

## 2017-11-10 NOTE — Progress Notes (Signed)
Assessment completed at 1016

## 2017-11-10 NOTE — Transfer of Care (Signed)
Immediate Anesthesia Transfer of Care Note  Patient: [name]  Procedure(s) Performed: Anterior Cervical Discectomy Fusion - Cervical Three-Cervical Four - Cervical Four-Cervical Five - Cervical Five-Cervical Six - Cervical Six-Cervical Seven (N/A )  Patient Location: PACU  Anesthesia Type:General  Level of Consciousness: drowsy and patient cooperative  Airway & Oxygen Therapy: Patient Spontanous Breathing and Patient connected to face mask oxygen  Post-op Assessment: Report given to RN and Post -op Vital signs reviewed and stable  Post vital signs: Reviewed and stable  Last Vitals:  Vitals:   11/10/17 0846 11/10/17 0910  BP: (!) 185/96 (!) 167/84  Pulse: 85   Resp: 20   Temp: 37.3 C   SpO2: 98%     Last Pain:  Vitals:   11/10/17 0846  TempSrc: Oral  PainSc: 5       Patients Stated Pain Goal: 5 (99/83/38 2505)  Complications: No apparent anesthesia complications

## 2017-11-10 NOTE — Anesthesia Preprocedure Evaluation (Addendum)
Anesthesia Evaluation  Patient identified by MRN, date of birth, ID band Patient awake    Reviewed: Allergy & Precautions, NPO status , Patient's Chart, lab work & pertinent test results  Airway Mallampati: II  TM Distance: >3 FB Neck ROM: Full    Dental  (+) Partial Upper   Pulmonary asthma (mild, controlled) , former smoker,    Pulmonary exam normal breath sounds clear to auscultation       Cardiovascular hypertension, Pt. on medications and Pt. on home beta blockers + CAD  Cardiac stents: x2, 2005.  Normal cardiovascular exam Rhythm:Regular Rate:Normal  ECG: ST  Saw cardiologist Tollie Eth, MD for pre-op eval 10/24/17 and cleared for surgery.   Echo 05/04/16:  Left ventricle: The cavity size was normal. There was moderateconcentric hypertrophy. Systolic function was normal. Theestimated ejection fraction was in the range of 60% to 65%. Wallmotion was normal; there were no regional wall motionabnormalities. Doppler parameters are consistent with abnormalleft ventricular relaxation (grade 1 diastolic dysfunction).     Neuro/Psych PSYCHIATRIC DISORDERS Anxiety Depression Spinal stenosis, lumbar region, without neurogenic claudication TIA   GI/Hepatic Neg liver ROS, hiatal hernia, GERD  Medicated,Irritable bowel syndrome   Endo/Other  negative endocrine ROS  Renal/GU negative Renal ROS     Musculoskeletal Chronic back pain   Abdominal (+) + obese,   Peds  Hematology HLD   Anesthesia Other Findings Stenosis  Reproductive/Obstetrics                           Anesthesia Physical Anesthesia Plan  ASA: III  Anesthesia Plan: General   Post-op Pain Management:    Induction: Intravenous  PONV Risk Score and Plan: 3 and Ondansetron, Dexamethasone, Midazolam and Treatment may vary due to age or medical condition  Airway Management Planned: Oral ETT and Video Laryngoscope  Planned  Additional Equipment:   Intra-op Plan:   Post-operative Plan: Extubation in OR  Informed Consent: I have reviewed the patients History and Physical, chart, labs and discussed the procedure including the risks, benefits and alternatives for the proposed anesthesia with the patient or authorized representative who has indicated his/her understanding and acceptance.   Dental advisory given  Plan Discussed with: CRNA  Anesthesia Plan Comments:         Anesthesia Quick Evaluation

## 2017-11-10 NOTE — Brief Op Note (Signed)
11/10/2017  2:05 PM  PATIENT:  [name]  67 y.o. female  PRE-OPERATIVE DIAGNOSIS:  Stenosis  POST-OPERATIVE DIAGNOSIS:  Stenosis  PROCEDURE:  Procedure(s): Anterior Cervical Discectomy Fusion - Cervical Three-Cervical Four - Cervical Four-Cervical Five - Cervical Five-Cervical Six - Cervical Six-Cervical Seven (N/A)  SURGEON:  Surgeon(s) and Role:    * Earnie Larsson, MD - Primary    * Erline Levine, MD - Assisting  PHYSICIAN ASSISTANT:   ASSISTANTS:    ANESTHESIA:   general  EBL:  500 mL   BLOOD ADMINISTERED:none  DRAINS: none   LOCAL MEDICATIONS USED:  NONE  SPECIMEN:  No Specimen  DISPOSITION OF SPECIMEN:  N/A  COUNTS:  YES  TOURNIQUET:  * No tourniquets in log *  DICTATION: .Dragon Dictation  PLAN OF CARE: Admit to inpatient   PATIENT DISPOSITION:  PACU - hemodynamically stable.   Delay start of Pharmacological VTE agent (>24hrs) due to surgical blood loss or risk of bleeding: yes
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
## 2017-11-10 NOTE — Op Note (Signed)
Date of procedure: 11/10/2017   Date of dictation: Same  Service: Neurosurgery  Preoperative diagnosis: C3-4, C4-5, C5-6, C6-7 stenosis with myelopathy  Postoperative diagnosis: Same  Procedure Name: C3-4, C4-5, C5-6, C6-7 anterior cervical discectomies with interbody fusion utilizing interbody peek cage, locally harvested autograft, and anterior plate instrumentation  Surgeon:Vrinda Heckstall A.Waverly Tarquinio, M.D.  Asst. Surgeon: Vertell Limber  Anesthesia: General  Indication: 67 year old female with neck and bilateral upper extremity symptoms failing conservative management.  Workup demonstrates evidence of marked multilevel cervical disc degeneration with associated spondylosis and stenosis worse at C3-4 and C4-5.  Patient presents now for 4 level anterior cervical decompression and fusion in hopes of improving her symptoms.  Operative note: After induction of anesthesia, patient position supine with neck slightly extended and held place holder traction.  Patient's anterior cervical region prepped and draped sterilely.  Incision made overlying C5.  Dissection performed on the right.  Retractor placed.  Fluoroscopy used.  Levels confirmed.  Disc spaces at C3-4, C4-5, C5-6 and C6-7 were incised and discectomies were performed with various instruments down to level the posterior annulus.  Microscope then brought to the field used throughout the remainder of the discectomy.  Remaining aspects of annulus and osteophytes were removed using high-speed drill down to level the posterior logical ligament.  Posterior logical is not elevated and resected in a piecemeal fashion.  Underlying thecal sac was then identified.  Wide central decompression was then performed undercutting the bodies of C3 and C4.  Decompression then proceeded into each neural foramen.  Wide anterior foraminotomies were performed on the course exiting C4 nerve roots bilaterally.  At this point a very thorough decompression had been achieved.  There was no  evidence of injury to the thecal sac or nerve roots.  Procedure was then repeated at C4-5, C5-6 and C6-7 again without complications.  Wound was then irrigated fanlike solution.  Gelfoam was placed topically for hemostasis then removed.  [employer] anatomic peek cages were then packed with locally harvested autograft and impacted in all 4 levels.  Each cage was recessed slightly from the anterior cortical margin.  Medtronic anterior Atlantis translational plate was then placed over the C3-C7 level.  This then attached under fluoroscopic guidance using 13 mm fixed angle screws to each at all 5 levels.  Locking screws engaged at all levels.  Final images reveal good position of the cages and the hardware at the proper operative level with normal alignment of the spine.  Wound is then irrigated fanlike solution.  Hemostasis was assured.  Wounds and closed in layers with Vicryl sutures.  Steri-Strips and sterile dressing were applied.  No apparent complications.  Patient tolerated the procedure well and she returns to the recovery room postop.

## 2017-11-11 MED ORDER — HYDROCODONE-ACETAMINOPHEN 10-325 MG PO TABS: 0.5000 | ORAL_TABLET | ORAL | 0 refills | Status: DC | PRN

## 2017-11-11 MED ORDER — DIPHENHYDRAMINE HCL 25 MG PO CAPS
25.0000 mg | ORAL_CAPSULE | Freq: Four times a day (QID) | ORAL | Status: DC | PRN
  Administered 2017-11-11: 25 mg via ORAL
  Filled 2017-11-11: qty 1

## 2017-11-11 NOTE — Evaluation (Signed)
Physical Therapy Evaluation & Discharge Patient Details Name: [name] MRN: [id number] DOB: 1951-03-28 Today's Date: 11/11/2017   History of Present Illness  Pt is a 67 y/o female s/p ACDF C3-C7. PMH including but not limited to anxiety, CAD, HTN and hx of CVA.  Clinical Impression  Pt presented supine in bed with HOB elevated, awake and willing to participate in therapy session. Prior to admission, pt reported that she was independent with all functional mobility and ADLs. Pt lives alone; however, reported that she would be staying with a friend for one week upon d/c. Her friend will be able to provide 24/7 supervision/assistance if needed. Pt currently able to performed bed mobility with supervision, transfers with supervision and ambulate in hallway without use of an AD with min guard for safety. Pt also successfully completed stair training this session with min guard for safety. PT provided pt with cervical precautions handout and reviewed with pt throughout session. No further acute PT needs identified at this time. PT signing off.     Follow Up Recommendations No PT follow up;Supervision for mobility/OOB    Equipment Recommendations  None recommended by PT    Recommendations for Other Services       Precautions / Restrictions Precautions Precautions: Fall;Cervical Precaution Booklet Issued: Yes (comment) Precaution Comments: provided pt with cervical precautions handout and reviewed log roll technique Required Braces or Orthoses: Cervical Brace Cervical Brace: Soft collar Restrictions Weight Bearing Restrictions: No      Mobility  Bed Mobility Overal bed mobility: Needs Assistance Bed Mobility: Rolling;Sidelying to Sit Rolling: Supervision Sidelying to sit: Supervision       General bed mobility comments: cueing for log roll technique  Transfers Overall transfer level: Needs assistance Equipment used: None Transfers: Sit to/from Stand Sit to Stand:  Supervision         General transfer comment: supervision for safety; pt performed sit<>stand from EOB x2 and from toilet x1  Ambulation/Gait Ambulation/Gait assistance: Min guard Ambulation Distance (Feet): 500 Feet Assistive device: None Gait Pattern/deviations: Step-through pattern;Decreased stride length Gait velocity: decreased Gait velocity interpretation: Below normal speed for age/gender General Gait Details: slow, cautious gait without use of an AD, min guard for safety, no overt LOB or need for physical assistance  Stairs Stairs: Yes Stairs assistance: Min guard Stair Management: One rail Left;Alternating pattern;Step to pattern;Forwards Number of Stairs: 2 General stair comments: no instability or LOB, min guard for safety with use of rail and one HHA  Wheelchair Mobility    Modified Rankin (Stroke Patients Only)       Balance Overall balance assessment: Needs assistance Sitting-balance support: Feet supported Sitting balance-Leahy Scale: Good     Standing balance support: During functional activity;No upper extremity supported Standing balance-Leahy Scale: Fair                               Pertinent Vitals/Pain Pain Assessment: No/denies pain    Home Living Family/patient expects to be discharged to:: Private residence Living Arrangements: Alone;Other (Comment)(going to stay with a friend for a week) Available Help at Discharge: Friend(s);Available 24 hours/day Type of Home: House Home Access: Stairs to enter   [employer] of Steps: 2 Home Layout: One level Home Equipment: None      Prior Function Level of Independence: Independent               Hand Dominance        Extremity/Trunk Assessment  Upper Extremity Assessment Upper Extremity Assessment: Defer to OT evaluation    Lower Extremity Assessment Lower Extremity Assessment: Overall WFL for tasks assessed    Cervical / Trunk Assessment Cervical /  Trunk Assessment: Other exceptions Cervical / Trunk Exceptions: s/p cervical fusion  Communication   Communication: No difficulties  Cognition Arousal/Alertness: Awake/alert Behavior During Therapy: Anxious Overall Cognitive Status: No family/caregiver present to determine baseline cognitive functioning Area of Impairment: Attention                   Current Attention Level: Sustained           General Comments: pt very chatty and easily distracted, but very likely her baseline      General Comments      Exercises     Assessment/Plan    PT Assessment Patent does not need any further PT services  PT Problem List         PT Treatment Interventions      PT Goals (Current goals can be found in the Care Plan section)  Acute Rehab PT Goals Patient Stated Goal: return home    Frequency     Barriers to discharge        Co-evaluation               AM-PAC PT "6 Clicks" Daily Activity  Outcome Measure Difficulty turning over in bed (including adjusting bedclothes, sheets and blankets)?: None Difficulty moving from lying on back to sitting on the side of the bed? : None Difficulty sitting down on and standing up from a chair with arms (e.g., wheelchair, bedside commode, etc,.)?: None Help needed moving to and from a bed to chair (including a wheelchair)?: None Help needed walking in hospital room?: None Help needed climbing 3-5 steps with a railing? : A Little 6 Click Score: 23    End of Session Equipment Utilized During Treatment: Gait belt;Cervical collar Activity Tolerance: Patient tolerated treatment well Patient left: in bed;with call bell/phone within reach;Other (comment)(sitting EOB) Nurse Communication: Mobility status PT Visit Diagnosis: Other abnormalities of gait and mobility (R26.89)    Time: 1610-9604 PT Time Calculation (min) (ACUTE ONLY): 23 min   Charges:   PT Evaluation $PT Eval Low Complexity: 1 Low PT Treatments $Gait  Training: 8-22 mins   PT G Codes:        [employer], PT, DPT [place] 11/11/2017, 9:14 AM

## 2017-11-11 NOTE — Discharge Summary (Signed)
Physician Discharge Summary  Patient ID: [name] MRN: [id number] DOB/AGE: 1951/08/03 67 y.o.  Admit date: 11/10/2017 Discharge date: 11/11/2017  Admission Diagnoses:  Cervical stenosis with myelopathy  Discharge Diagnoses:  Cervical stenosis with myelopathy Active Problems:   Cervical myelopathy [facility])   Discharged Condition: good  Hospital Course: Patient was admitted by Dr. [name], who performed a 4 level CIII-4, C4-5, C5-6, C6-7 ACDF. Postoperatively she is done well. She is up and ambulate actively. She is voiding well. Dressing is clean and dry. We are discharging her to home with instructions regarding wound care and activities. She is scheduled for follow-up with Dr. [name] in 2 weeks.  Discharge Exam: Blood pressure 102/79, pulse 84, temperature 98 F (36.7 C), resp. rate 18, height 5\' 3"  (1.6 m), weight 81.2 kg (179 lb), SpO2 97 %.  Disposition: 01-Home or Self Care  Discharge Instructions    Discharge wound care:   Complete by:  As directed    Leave dressing on, remove dressing in 2-3 days and leave the wound open to air. Shower daily with the wound covered for the first 10 days.   Driving Restrictions   Complete by:  As directed    No driving for 2 weeks. May ride in the car locally now. May begin to drive locally in 2 weeks.   Other Restrictions   Complete by:  As directed    Walk gradually increasing distances out in the fresh air at least twice a day. Walking additional 6 times inside the house, gradually increasing distances, daily. No bending, lifting, or twisting. Perform activities between shoulder and waist height (that is at counter height when standing or table height when sitting).     Allergies as of 11/11/2017      Reactions   Amoxicillin Anaphylaxis   Throat Swells Has patient had a PCN reaction causing immediate rash, facial/tongue/throat swelling, SOB or lightheadedness with hypotension: Yes Has patient had a PCN reaction causing severe rash involving  mucus membranes or skin necrosis: No Has patient had a PCN reaction that required hospitalization: No Has patient had a PCN reaction occurring within the last 10 years: No If all of the above answers are "NO", then may proceed with Cephalosporin use.   Azithromycin Anaphylaxis   Throat Swelling   Bromfed Anaphylaxis   Throat Swelling   Cephalexin Anaphylaxis   Throat Swelling   Chlordiazepoxide-clidinium Anaphylaxis   Throat Swelling   Claritin [loratadine] Anaphylaxis   Clotrimazole Swelling, Other (See Comments), Hypertension   Patient told me that she couldn't swallow due to the medication   Gabapentin Other (See Comments)   Nausea, weakness, lost movement and feeling in both legs (HAD TO CALL EMS)   Gatifloxacin Shortness Of Breath, Other (See Comments)   Caused bad chest congestion and caused a severe asthma attack   Ibuprofen Anaphylaxis   Iohexol Anaphylaxis, Shortness Of Breath, Swelling, Other (See Comments)    Code: HIVES, Desc: throat swelling no hives 20 yrs ago;needs pre-medication  09/19/07 sg, Onset Date: [id number]   Lidocaine Hives, Hypertension   REQUIRED A TRIP TO [place]   Other Other (See Comments)   PT IS HIGHLY ALLERGIC TO THE STICKY ELECTRODE PADS - CAUSES BLEEDING AND SKIN PEELING - LEAVES SCARS   Paroxetine Anaphylaxis   Throat Swelling   Penicillins Anaphylaxis, Hives   PATIENT HAS HAD A PCN REACTION WITH IMMEDIATE RASH, FACIAL/TONGUE/THROAT SWELLING, SOB, OR LIGHTHEADEDNESS WITH HYPOTENSION:  #  #  #  YES  #  #  #  Has patient had a PCN reaction causing severe rash involving mucus membranes or skin necrosis: No Has patient had a PCN reaction that required hospitalization No Has patient had a PCN reaction occurring within the last 10 years: No.   Prednisone Anaphylaxis, Swelling   Throat swelling   Pregabalin Anxiety, Anaphylaxis   nervousness   Propoxyphene N-acetaminophen Anaphylaxis   REACTION: swelling in the throat   Sertraline Hcl Anaphylaxis    Throat Swelling   Sulfa Antibiotics Anaphylaxis   Sulfadiazine Anaphylaxis   Throat Swelling   Verapamil Anaphylaxis   Throat Swelling   Adhesive [tape] Other (See Comments)   blisters   Clonazepam Nausea Only, Other (See Comments)   numbness, weakness in her arms, legs and increased tremors   Effexor [venlafaxine] Hives, Itching   Escitalopram Nausea Only, Other (See Comments)   LEXAPRO== Nausea, numb, tingly   Ibuprofen Nausea And Vomiting   Latex Swelling   Blisters on Skin   Lisinopril Other (See Comments)   ANGIOEDEMA   Tussionex Pennkinetic Er [hydrocod Polst-cpm Polst Er] Itching, Photosensitivity   "Sunburn"   Dicyclomine Hcl Hives   Hydralazine Hcl Itching, Rash   Pseudoephedrine Hives, Itching, Rash   Valium [diazepam] Itching, Nausea Only   Took in hospital and had nausea, couldn't swallow, itching      Medication List    TAKE these medications   acetaminophen 500 MG tablet Commonly known as:  TYLENOL Take 500-1,000 mg by mouth every 6 (six) hours as needed for moderate pain.   albuterol 108 (90 Base) MCG/ACT inhaler Commonly known as:  PROAIR HFA INHALE 2 PUFFS BY MOUTH IN TO THE LUNGS EVERY 4 HOURS AS NEEDED FOR WHEEZING OR SHORTNESS OF BREATH   ALPRAZolam 1 MG tablet Commonly known as:  XANAX TAKE 1 TABLET BY MOUTH TWICE DAILY AS NEEDED FOR ANXIETY   AMBULATORY NON FORMULARY MEDICATION Medication Name: nitroglycerin 0.125% gel. Apply a pea size amount to your rectum three times daily x 6-8 weeks, or until fissure feels better. What changed:    how much to take  how to take this  when to take this  additional instructions   ascorbic acid 1000 MG tablet Commonly known as:  VITAMIN C Take 1,000 mg by mouth daily.   aspirin EC 81 MG tablet Take 81 mg by mouth daily.   BIOTIN PO Take 1 tablet by mouth every morning. Hair Skin and Nails   BREO ELLIPTA 100-25 MCG/INH Aepb Generic drug:  fluticasone furoate-vilanterol Inhale 1 puff into the  lungs daily.   CALCIUM-CARB 600 + D 600-125 MG-UNIT Tabs Generic drug:  Calcium Carbonate-Vitamin D Take 1 tablet by mouth daily.   cetirizine 10 MG tablet Commonly known as:  ZYRTEC Take 1 tablet (10 mg total) by mouth daily.   fluticasone 50 MCG/ACT nasal spray Commonly known as:  FLONASE Place 1-2 sprays into both nostrils daily. What changed:    when to take this  reasons to take this   HYDROcodone-acetaminophen 10-325 MG tablet Commonly known as:  NORCO Take 0.5-1 tablets by mouth every 4 (four) hours as needed (pain).   hydrocortisone 25 MG suppository Commonly known as:  ANUSOL-HC Place 1 suppository (25 mg total) rectally 2 (two) times daily.   hydroxypropyl methylcellulose / hypromellose 2.5 % ophthalmic solution Commonly known as:  ISOPTO TEARS / GONIOVISC Place 1 drop into the right eye 3 (three) times daily as needed for dry eyes.   losartan 100 MG tablet Commonly known as:  COZAAR TAKE 1  TABLET(100 MG) BY MOUTH DAILY   magnesium chloride 64 MG Tbec SR tablet Commonly known as:  SLOW-MAG Take 2 tablets (128 mg total) by mouth daily.   metoprolol tartrate 50 MG tablet Commonly known as:  LOPRESSOR Take 1 tablet (50 mg total) by mouth 2 (two) times daily.   nitroGLYCERIN 0.4 MG SL tablet Commonly known as:  NITROSTAT Place 0.4 mg under the tongue every 5 (five) minutes as needed for chest pain.   pantoprazole 40 MG tablet Commonly known as:  PROTONIX TAKE 1 TABLET(40 MG) BY MOUTH TWICE DAILY BEFORE A MEAL   potassium chloride 10 MEQ tablet Commonly known as:  K-DUR TAKE 2 TABLETS BY MOUTH EVERY OTHER DAY. CAN BREAK TABLETS IN HALF OR DISSOLVE IN WATER.   pravastatin 40 MG tablet Commonly known as:  PRAVACHOL TAKE 1 TABLET BY MOUTH IN THE EVENING   vitamin E 400 UNIT capsule Generic drug:  vitamin E Take 400 Units by mouth daily.   VOLTAREN 1 % Gel Generic drug:  diclofenac sodium Apply 2 g topically 2 (two) times daily. 1% GEL             Discharge Care Instructions  (From admission, onward)        Start     Ordered   11/11/17 0000  Discharge wound care:    Comments:  Leave dressing on, remove dressing in 2-3 days and leave the wound open to air. Shower daily with the wound covered for the first 10 days.   11/11/17 4010     Follow-up Information    Earnie Larsson, MD Follow up.   Specialty:  Neurosurgery Contact information: [address] [place] [address] [phone]           Signed: Hosie Spangle 11/11/2017, 9:18 AM

## 2017-11-11 NOTE — Progress Notes (Signed)
Patient alert and oriented, mae's well, voiding adequate amount of urine, swallowing without difficulty, no c/o pain at time of discharge. Patient discharged home with family. Script and discharged instructions given to patient. Patient and family stated understanding of instructions given. Patient has an appointment with Dr. Pool  

## 2017-11-11 NOTE — Evaluation (Signed)
Occupational Therapy Evaluation Patient Details Name: [name] MRN: [id number] DOB: 03-15-1951 Today's Date: 11/11/2017    History of Present Illness Pt is a 67 y/o female s/p ACDF C3-C7. PMH including but not limited to anxiety, CAD, HTN and hx of CVA.   Clinical Impression   PTA, pt living alone was independent. Pt planning to stay at a friend's home at dc.  Pt currently performing ADLs and functional mobility at [facility] A level. Providing education and handout on cervical precautions, UB ADLs, LB ADLs, toileting, and tub transfers; pt verbalizing and demonstrating understanding. Recommend dc home once medically stable per physician. All acute OT needs met and will sign off. Thank you.    Follow Up Recommendations  No OT follow up;Supervision/Assistance - 24 hour    Equipment Recommendations  None recommended by OT    Recommendations for Other Services       Precautions / Restrictions Precautions Precautions: Fall;Cervical Precaution Booklet Issued: Yes (comment) Precaution Comments: provided pt with cervical precautions handout and reviewed log roll technique Required Braces or Orthoses: Cervical Brace Cervical Brace: Soft collar Restrictions Weight Bearing Restrictions: No      Mobility Bed Mobility Overal bed mobility: Needs Assistance Bed Mobility: Rolling;Sidelying to Sit Rolling: Supervision Sidelying to sit: Supervision       General bed mobility comments: Pt sitting at EOB upon arrival. Reviewed log roll technique  Transfers Overall transfer level: Needs assistance Equipment used: None Transfers: Sit to/from Stand Sit to Stand: Supervision         General transfer comment: supervision for safety; pt performed sit<>stand from EOB x2 and from toilet x1    Balance Overall balance assessment: Needs assistance Sitting-balance support: Feet supported Sitting balance-Leahy Scale: Good     Standing balance support: During functional  activity;No upper extremity supported Standing balance-Leahy Scale: Fair                             ADL either performed or assessed with clinical judgement   ADL Overall ADL's : Needs assistance/impaired Eating/Feeding: Set up;Sitting   Grooming: Brushing hair;Wash/dry face;Standing;Supervision/safety;Set up Grooming Details (indicate cue type and reason): Pt performing grooming at sink with supervision and demosntrating adherance to cervical precautions. Educated pt on compensatory techniques for oral care Upper Body Bathing: Set up;Supervision/ safety;Sitting   Lower Body Bathing: Min guard;Sit to/from stand     Upper Body Dressing Details (indicate cue type and reason): Educating pt on comensatory technique for UB ADLs and adherance to cervical precautions. Pt verablized understanding. Pt without close at hospital because her friend is bringing them later.  Lower Body Dressing: Min guard Lower Body Dressing Details (indicate cue type and reason): Pt able to bring Ankles to knees for LB dressing. Educated on LB dressing techniques. Min Guard for safety in standing Toilet Transfer: Supervision/safety;Ambulation;Regular Toilet   Toileting- Clothing Manipulation and Hygiene: Sitting/lateral lean;Independent   Tub/ Shower Transfer: Tub transfer;Min guard;Ambulation Tub/Shower Transfer Details (indicate cue type and reason): Educated pt on safe tub transfer. Pt performed with Mi nGuard A Functional mobility during ADLs: Min guard General ADL Comments: Educating pt on cervical precautions, compensatory techniques for UB ADLs, LB ADLs, toileting, grooming, and tub transfer.      Vision         Perception     Praxis      Pertinent Vitals/Pain Pain Assessment: No/denies pain     Hand Dominance Right   Extremity/Trunk Assessment Upper  Extremity Assessment Upper Extremity Assessment: Overall WFL for tasks assessed   Lower Extremity Assessment Lower Extremity  Assessment: Defer to PT evaluation   Cervical / Trunk Assessment Cervical / Trunk Assessment: Other exceptions Cervical / Trunk Exceptions: s/p cervical fusion   Communication Communication Communication: No difficulties   Cognition Arousal/Alertness: Awake/alert Behavior During Therapy: WFL for tasks assessed/performed Overall Cognitive Status: No family/caregiver present to determine baseline cognitive functioning Area of Impairment: Attention                   Current Attention Level: Sustained           General Comments: Pt easily distracted. Feel pt close to baseline. Pt asking "this medication with wear off and I will feel more clear headed soon, right?"   General Comments       Exercises     Shoulder Instructions      Home Living Family/patient expects to be discharged to:: Private residence Living Arrangements: Alone;Other (Comment)(going to stay with a friend for a week) Available Help at Discharge: Friend(s);Available 24 hours/day Type of Home: House Home Access: Stairs to enter [employer] of Steps: 2   Home Layout: One level     Bathroom Shower/Tub: Teacher, early years/pre: Standard     Home Equipment: None   Additional Comments: Above information for friend's home      Prior Functioning/Environment Level of Independence: Independent                 OT Problem List: Decreased strength;Decreased range of motion;Decreased activity tolerance;Impaired balance (sitting and/or standing);Pain;Decreased knowledge of use of DME or AE;Decreased knowledge of precautions      OT Treatment/Interventions:      OT Goals(Current goals can be found in the care plan section) Acute Rehab OT Goals Patient Stated Goal: return home OT Goal Formulation: All assessment and education complete, DC therapy  OT Frequency:     Barriers to D/C:            Co-evaluation              AM-PAC PT "6 Clicks" Daily Activity      Outcome Measure Help from another person eating meals?: None Help from another person taking care of personal grooming?: None Help from another person toileting, which includes using toliet, bedpan, or urinal?: A Little Help from another person bathing (including washing, rinsing, drying)?: A Little Help from another person to put on and taking off regular upper body clothing?: A Little Help from another person to put on and taking off regular lower body clothing?: A Little 6 Click Score: 20   End of Session Equipment Utilized During Treatment: Cervical collar Nurse Communication: Mobility status;Precautions  Activity Tolerance: Patient tolerated treatment well Patient left: in bed;with call bell/phone within reach(sitting at EOB)  OT Visit Diagnosis: Unsteadiness on feet (R26.81);Other abnormalities of gait and mobility (R26.89);Muscle weakness (generalized) (M62.81);Pain Pain - part of body: (Neck)                Time: 6010-9323 OT Time Calculation (min): 19 min Charges:  OT General Charges $OT Visit: 1 Visit OT Evaluation $OT Eval Moderate Complexity: 1 Mod G-Codes:     Tramel Westbrook MSOT, OTR/L Acute Rehab Pager: [phone] Office: [place] 11/11/2017, 10:32 AM

## 2017-11-11 NOTE — Discharge Instructions (Signed)

## 2017-11-11 NOTE — Anesthesia Postprocedure Evaluation (Signed)
Anesthesia Post Note  Patient: [name]  Procedure(s) Performed: Anterior Cervical Discectomy Fusion - Cervical Three-Cervical Four - Cervical Four-Cervical Five - Cervical Five-Cervical Six - Cervical Six-Cervical Seven (N/A )     Patient location during evaluation: PACU Anesthesia Type: General Level of consciousness: awake and alert Pain management: pain level controlled Vital Signs Assessment: post-procedure vital signs reviewed and stable Respiratory status: spontaneous breathing, nonlabored ventilation, respiratory function stable and patient connected to nasal cannula oxygen Cardiovascular status: blood pressure returned to baseline and stable Postop Assessment: no apparent nausea or vomiting Anesthetic complications: no    Last Vitals:  Vitals:   11/11/17 0335 11/11/17 0822  BP: 119/69 102/79  Pulse: 99 84  Resp: 18 18  Temp: 37.1 C 36.7 C  SpO2: 97% 97%    Last Pain:  Vitals:   11/11/17 0547  TempSrc:   PainSc: 4                  Barnet Glasgow

## 2017-11-12 DIAGNOSIS — I2510 Atherosclerotic heart disease of native coronary artery without angina pectoris: Secondary | ICD-10-CM | POA: Diagnosis not present

## 2017-11-12 DIAGNOSIS — I110 Hypertensive heart disease with heart failure: Secondary | ICD-10-CM | POA: Diagnosis not present

## 2017-11-12 DIAGNOSIS — R2681 Unsteadiness on feet: Secondary | ICD-10-CM | POA: Diagnosis not present

## 2017-11-12 DIAGNOSIS — F329 Major depressive disorder, single episode, unspecified: Secondary | ICD-10-CM | POA: Diagnosis not present

## 2017-11-12 DIAGNOSIS — M4802 Spinal stenosis, cervical region: Secondary | ICD-10-CM | POA: Diagnosis not present

## 2017-11-12 DIAGNOSIS — M6281 Muscle weakness (generalized): Secondary | ICD-10-CM | POA: Diagnosis not present

## 2017-11-12 DIAGNOSIS — I5032 Chronic diastolic (congestive) heart failure: Secondary | ICD-10-CM | POA: Diagnosis not present

## 2017-11-13 ENCOUNTER — Telehealth: Payer: Self-pay | Admitting: Internal Medicine

## 2017-11-13 ENCOUNTER — Other Ambulatory Visit: Payer: Self-pay | Admitting: Internal Medicine

## 2017-11-13 ENCOUNTER — Encounter (HOSPITAL_COMMUNITY): Payer: Self-pay | Admitting: Neurosurgery

## 2017-11-13 MED ORDER — ALPRAZOLAM 1 MG PO TABS: 1.0000 mg | ORAL_TABLET | Freq: Two times a day (BID) | ORAL | 1 refills | Status: DC | PRN

## 2017-11-13 MED FILL — Gelatin Absorbable MT Powder: OROMUCOSAL | Qty: 1 | Status: AC

## 2017-11-13 MED FILL — Thrombin For Soln 5000 Unit: CUTANEOUS | Qty: 2 | Status: AC

## 2017-11-13 MED FILL — Thrombin For Soln Kit 20000 Unit: CUTANEOUS | Qty: 1 | Status: AC

## 2017-11-13 MED FILL — Thrombin For Soln 5000 Unit: CUTANEOUS | Qty: 5000 | Status: AC

## 2017-11-13 NOTE — Telephone Encounter (Signed)
Call from pt - requesting Xanax refill.

## 2017-11-13 NOTE — Telephone Encounter (Signed)
Pls call patient  °

## 2017-11-13 NOTE — Telephone Encounter (Signed)
Return pt's call - no answer; left message.

## 2017-11-14 DIAGNOSIS — F329 Major depressive disorder, single episode, unspecified: Secondary | ICD-10-CM | POA: Diagnosis not present

## 2017-11-14 DIAGNOSIS — I2510 Atherosclerotic heart disease of native coronary artery without angina pectoris: Secondary | ICD-10-CM | POA: Diagnosis not present

## 2017-11-14 DIAGNOSIS — M4802 Spinal stenosis, cervical region: Secondary | ICD-10-CM | POA: Diagnosis not present

## 2017-11-14 DIAGNOSIS — R2681 Unsteadiness on feet: Secondary | ICD-10-CM | POA: Diagnosis not present

## 2017-11-14 DIAGNOSIS — M6281 Muscle weakness (generalized): Secondary | ICD-10-CM | POA: Diagnosis not present

## 2017-11-14 DIAGNOSIS — I5032 Chronic diastolic (congestive) heart failure: Secondary | ICD-10-CM | POA: Diagnosis not present

## 2017-11-14 DIAGNOSIS — I110 Hypertensive heart disease with heart failure: Secondary | ICD-10-CM | POA: Diagnosis not present

## 2017-11-16 ENCOUNTER — Encounter: Payer: Self-pay | Admitting: *Deleted

## 2017-11-16 DIAGNOSIS — I2510 Atherosclerotic heart disease of native coronary artery without angina pectoris: Secondary | ICD-10-CM | POA: Diagnosis not present

## 2017-11-16 DIAGNOSIS — F329 Major depressive disorder, single episode, unspecified: Secondary | ICD-10-CM | POA: Diagnosis not present

## 2017-11-16 DIAGNOSIS — M6281 Muscle weakness (generalized): Secondary | ICD-10-CM | POA: Diagnosis not present

## 2017-11-16 DIAGNOSIS — I5032 Chronic diastolic (congestive) heart failure: Secondary | ICD-10-CM | POA: Diagnosis not present

## 2017-11-16 DIAGNOSIS — I110 Hypertensive heart disease with heart failure: Secondary | ICD-10-CM | POA: Diagnosis not present

## 2017-11-16 DIAGNOSIS — M4802 Spinal stenosis, cervical region: Secondary | ICD-10-CM | POA: Diagnosis not present

## 2017-11-16 DIAGNOSIS — R2681 Unsteadiness on feet: Secondary | ICD-10-CM | POA: Diagnosis not present

## 2017-11-20 ENCOUNTER — Other Ambulatory Visit: Payer: Self-pay

## 2017-11-20 ENCOUNTER — Ambulatory Visit (INDEPENDENT_AMBULATORY_CARE_PROVIDER_SITE_OTHER): Payer: Medicare HMO | Admitting: Internal Medicine

## 2017-11-20 VITALS — BP 141/75 | HR 70 | Temp 98.0°F | Ht 63.0 in | Wt 174.2 lb

## 2017-11-20 DIAGNOSIS — E162 Hypoglycemia, unspecified: Secondary | ICD-10-CM

## 2017-11-20 DIAGNOSIS — I110 Hypertensive heart disease with heart failure: Secondary | ICD-10-CM | POA: Diagnosis not present

## 2017-11-20 DIAGNOSIS — I10 Essential (primary) hypertension: Secondary | ICD-10-CM

## 2017-11-20 DIAGNOSIS — G959 Disease of spinal cord, unspecified: Secondary | ICD-10-CM | POA: Diagnosis not present

## 2017-11-20 DIAGNOSIS — I5032 Chronic diastolic (congestive) heart failure: Secondary | ICD-10-CM | POA: Diagnosis not present

## 2017-11-20 DIAGNOSIS — F329 Major depressive disorder, single episode, unspecified: Secondary | ICD-10-CM | POA: Diagnosis not present

## 2017-11-20 DIAGNOSIS — M6281 Muscle weakness (generalized): Secondary | ICD-10-CM | POA: Diagnosis not present

## 2017-11-20 DIAGNOSIS — I2510 Atherosclerotic heart disease of native coronary artery without angina pectoris: Secondary | ICD-10-CM | POA: Diagnosis not present

## 2017-11-20 DIAGNOSIS — M4802 Spinal stenosis, cervical region: Secondary | ICD-10-CM | POA: Diagnosis not present

## 2017-11-20 DIAGNOSIS — R2681 Unsteadiness on feet: Secondary | ICD-10-CM | POA: Diagnosis not present

## 2017-11-20 MED ORDER — METOPROLOL TARTRATE 100 MG PO TABS: 100.0000 mg | ORAL_TABLET | Freq: Two times a day (BID) | ORAL | 2 refills | Status: DC
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
## 2017-11-20 NOTE — Patient Instructions (Signed)
Thank you for allowing [place] to provide your care. I have increased your Metoprolol to 100 mg twice daily. I have sent out a new prescription. We will get some blood work today and I will call you with the results.

## 2017-11-21 ENCOUNTER — Encounter: Payer: Self-pay | Admitting: Internal Medicine

## 2017-11-21 DIAGNOSIS — E162 Hypoglycemia, unspecified: Secondary | ICD-10-CM | POA: Insufficient documentation

## 2017-11-21 NOTE — Assessment & Plan Note (Signed)
Patient has cervical stenosis with myelopathy s/p a 4 level CIII-4, C4-5, C5-6, C6-7 ACDF on 03/01. She was discharged on 3/2 after surgery. Since that time she continues to work with home health and physical therapy. She feels that she is doing well and her pain is well controlled. She has not noticed any weakness or change in sensation in her arms, trunk, or lower extremities. She has not noticed any shortness of breath, changes in bladder or bowel habits, or chest pain. She is scheduled to follow-up with Dr. Trenton Gammon on 3/14.  On physical exam the patient's narrow exam including strength and sensation in the upper and lower extremities are normal.  Patient will follow-up with Dr. Trenton Gammon on 3/14. No changes made to her medical management at this point.

## 2017-11-21 NOTE — Assessment & Plan Note (Signed)
Patient presents today for continued evaluation and management of her hypertension. She states that she is compliant with her metoprolol and losartan daily. She has noticed that when her home health aide checks her blood pressure at home is consistently elevated. She denies headaches, visual changes, shortness of breath, chest pain, abdominal pain.  On physical exam her blood pressure is elevated at 159/77. On repeat it is 141/75.  Patient's blood pressure is above goal, less than 130/80. Unfortunately her medication options are limited by her allergies. She has had anaphylaxis to calcium channel blockers in the past, and has had hypokalemia so I'm hesitant to add a thiazide diuretic. Pulse is currently 70 and she has never been bradycardic, therefore we will increase her metoprolol to 100 mg BID. We will also go ahead and evaluate for hyper aldosterone given her severe hypokalemia in the past.  Plan: - Continue Losartan  - Increase metoprolol to 100 mg BID  - Check Renin/aldosterone ratio

## 2017-11-21 NOTE — Assessment & Plan Note (Signed)
During her last visit the patient voiced concerns about waking up in the morning feeling lightheaded, weak, diaphoretic, and tremulous. She states that after drinking a glass of juice or eating something sugary the symptoms would resolve. They primarily occurred in the morning and never throughout the day or after a large meal. She states that she talked with a friend who thought she might be diabetic and presented last time for an A1c check. Her A1c at the time was 6.1 qualifying her as prediabetic. Since our last visit she has not had a recurrence of her symptoms. She overall feels well and thinks that the symptoms were actually related to stress/anxiety. She is asking if further testing needs to be pursued.  Initially there was concern for Whipple's triad given that she was having symptoms of hypoglycemia that resolved with eating. However it would be unlikely that she be experiencing pancreatic dysfunction given that her symptoms have resolved. It is also unlikely that this is related to adrenal insufficiency given her hypertension. She is not on any medications that could be causing hypoglycemia, and to my knowledge does not live with anyone or have access to any hypoglycemic agents. We discussed at this point that no further investigation needs to be pursued; however, if she begins to have recurrence of her symptoms we will consider checking an insulin level, C-peptide, and insulin antibodies.

## 2017-11-22 ENCOUNTER — Other Ambulatory Visit: Payer: Self-pay | Admitting: *Deleted

## 2017-11-22 DIAGNOSIS — I2510 Atherosclerotic heart disease of native coronary artery without angina pectoris: Secondary | ICD-10-CM | POA: Diagnosis not present

## 2017-11-22 DIAGNOSIS — I110 Hypertensive heart disease with heart failure: Secondary | ICD-10-CM | POA: Diagnosis not present

## 2017-11-22 DIAGNOSIS — R2681 Unsteadiness on feet: Secondary | ICD-10-CM | POA: Diagnosis not present

## 2017-11-22 DIAGNOSIS — M4802 Spinal stenosis, cervical region: Secondary | ICD-10-CM | POA: Diagnosis not present

## 2017-11-22 DIAGNOSIS — I10 Essential (primary) hypertension: Secondary | ICD-10-CM

## 2017-11-22 DIAGNOSIS — I5032 Chronic diastolic (congestive) heart failure: Secondary | ICD-10-CM | POA: Diagnosis not present

## 2017-11-22 DIAGNOSIS — F329 Major depressive disorder, single episode, unspecified: Secondary | ICD-10-CM | POA: Diagnosis not present

## 2017-11-22 DIAGNOSIS — M6281 Muscle weakness (generalized): Secondary | ICD-10-CM | POA: Diagnosis not present

## 2017-11-22 MED ORDER — PANTOPRAZOLE SODIUM 40 MG PO TBEC: DELAYED_RELEASE_TABLET | ORAL | 0 refills | Status: DC

## 2017-11-22 NOTE — Telephone Encounter (Signed)
completed

## 2017-11-23 ENCOUNTER — Encounter: Payer: Self-pay | Admitting: Internal Medicine

## 2017-11-23 DIAGNOSIS — R2681 Unsteadiness on feet: Secondary | ICD-10-CM | POA: Diagnosis not present

## 2017-11-23 DIAGNOSIS — I2510 Atherosclerotic heart disease of native coronary artery without angina pectoris: Secondary | ICD-10-CM | POA: Diagnosis not present

## 2017-11-23 DIAGNOSIS — F329 Major depressive disorder, single episode, unspecified: Secondary | ICD-10-CM | POA: Diagnosis not present

## 2017-11-23 DIAGNOSIS — I5032 Chronic diastolic (congestive) heart failure: Secondary | ICD-10-CM | POA: Diagnosis not present

## 2017-11-23 DIAGNOSIS — M4802 Spinal stenosis, cervical region: Secondary | ICD-10-CM | POA: Diagnosis not present

## 2017-11-23 DIAGNOSIS — M6281 Muscle weakness (generalized): Secondary | ICD-10-CM | POA: Diagnosis not present

## 2017-11-23 DIAGNOSIS — I110 Hypertensive heart disease with heart failure: Secondary | ICD-10-CM | POA: Diagnosis not present

## 2017-11-23 MED ORDER — LOSARTAN POTASSIUM 100 MG PO TABS: 100.0000 mg | ORAL_TABLET | Freq: Every day | ORAL | 1 refills | Status: DC

## 2017-11-23 MED ORDER — PRAVASTATIN SODIUM 40 MG PO TABS: ORAL_TABLET | ORAL | 1 refills | Status: DC

## 2017-11-23 MED ORDER — METOPROLOL TARTRATE 100 MG PO TABS: 100.0000 mg | ORAL_TABLET | Freq: Two times a day (BID) | ORAL | 1 refills | Status: DC

## 2017-11-23 NOTE — Progress Notes (Signed)
Case discussed with Dr. Helberg at the time of the visit.  We reviewed the resident's history and exam and pertinent patient test results.  I agree with the assessment, diagnosis and plan of care documented in the resident's note. 

## 2017-11-24 LAB — ALDOSTERONE + RENIN ACTIVITY W/ RATIO
ALDOS/RENIN RATIO: >8.4 (ref 0.0–30.0)
ALDOSTERONE: 1.4 ng/dL (ref 0.0–30.0)
Renin: <0.167 ng/mL/hr — ABNORMAL LOW (ref 0.167–5.380)

## 2017-11-27 ENCOUNTER — Other Ambulatory Visit: Payer: Self-pay | Admitting: *Deleted

## 2017-11-28 ENCOUNTER — Telehealth: Payer: Self-pay | Admitting: Internal Medicine

## 2017-11-28 ENCOUNTER — Other Ambulatory Visit: Payer: Self-pay | Admitting: *Deleted

## 2017-11-28 MED ORDER — POTASSIUM CHLORIDE ER 10 MEQ PO TBCR: EXTENDED_RELEASE_TABLET | ORAL | 0 refills | Status: DC

## 2017-11-28 MED ORDER — ALPRAZOLAM 1 MG PO TABS: 1.0000 mg | ORAL_TABLET | Freq: Two times a day (BID) | ORAL | 1 refills | Status: DC | PRN

## 2017-11-28 NOTE — Telephone Encounter (Signed)
Called [facility], requested K+, informed [facility] rep xanax filled 3/5 at [facility] and has another refill available

## 2017-11-28 NOTE — Telephone Encounter (Signed)
[name] from [facility] is following on medicine alprazolam 1mg  & potassium chloride 33meq tablet

## 2017-11-30 ENCOUNTER — Ambulatory Visit (INDEPENDENT_AMBULATORY_CARE_PROVIDER_SITE_OTHER): Payer: Medicare HMO | Admitting: Psychiatry

## 2017-11-30 DIAGNOSIS — F4323 Adjustment disorder with mixed anxiety and depressed mood: Secondary | ICD-10-CM | POA: Diagnosis not present

## 2017-12-01 ENCOUNTER — Encounter: Payer: Self-pay | Admitting: Internal Medicine

## 2017-12-01 ENCOUNTER — Ambulatory Visit (INDEPENDENT_AMBULATORY_CARE_PROVIDER_SITE_OTHER): Payer: Medicare HMO | Admitting: Internal Medicine

## 2017-12-01 VITALS — BP 154/80 | HR 78 | Ht 63.0 in | Wt 176.0 lb

## 2017-12-01 DIAGNOSIS — K219 Gastro-esophageal reflux disease without esophagitis: Secondary | ICD-10-CM | POA: Diagnosis not present

## 2017-12-01 DIAGNOSIS — R1314 Dysphagia, pharyngoesophageal phase: Secondary | ICD-10-CM

## 2017-12-01 DIAGNOSIS — K602 Anal fissure, unspecified: Secondary | ICD-10-CM

## 2017-12-01 DIAGNOSIS — K5900 Constipation, unspecified: Secondary | ICD-10-CM

## 2017-12-01 MED ORDER — PANTOPRAZOLE SODIUM 40 MG PO TBEC: 40.0000 mg | DELAYED_RELEASE_TABLET | Freq: Every day | ORAL | 3 refills | Status: DC

## 2017-12-01 NOTE — Progress Notes (Signed)
Subjective:    Patient ID: [name], female    DOB: Nov 19, 1950, 67 y.o.   MRN: [id number]  HPI [name] is a 67 year old female with a history of IBS, constipation with abdominal bloating, history of gastritis and Candida esophagitis who is here for follow-up.  She was last seen on 09/20/2017.  She underwent successful cervical disc surgery by anterior approach on 11/10/2017.  After her last visit we performed a CT scan of her abdomen and pelvis for abdominal pain.  This showed diverticulosis without diverticulitis but no acute findings.  Fortunately her abdominal pain has improved tremendously.  Today she is having more issues with constipation which she attributes to the postsurgical period.  Stools have been hard and just yesterday she was straining with a hard stool and feels that she re-exacerbated her anal fissure.  There was pain with passing stool as well as red blood on the tissue.  She is also had some issues with swallowing since her cervical disc surgery.  Solid foods have been harder to pass and she is perceiving this in her neck and near the sternal notch.  She is eating soft solid foods and trying to eat slowly.   Review of Systems As per HPI, otherwise negative  Current Medications, Allergies, Past Medical History, Past Surgical History, Family History and Social History were reviewed in [employer] record.     Objective:   Physical Exam BP (!) 154/80   Pulse 78   Ht 5\' 3"  (1.6 m)   Wt 176 lb (79.8 kg)   BMI 31.18 kg/m  Constitutional: Well-developed and well-nourished. No distress. HEENT: Normocephalic and atraumatic.  Conjunctivae are normal.  No scleral icterus. Neck: Neck supple. Trachea midline.  Well-healing incision right anterior neck Cardiovascular: Normal rate, regular rhythm and intact distal pulses.  Pulmonary/chest: Effort normal and breath sounds normal.  Abdominal: Soft, nontender, nondistended. Bowel sounds active  throughout.  Extremities: no clubbing, cyanosis, or edema Neurological: Alert and oriented to person place and time. Skin: Skin is warm and dry. Psychiatric: Normal mood and affect. Behavior is normal.  CT ABDOMEN AND PELVIS WITHOUT CONTRAST   TECHNIQUE: Multidetector CT imaging of the abdomen and pelvis was performed following the standard protocol without IV contrast.   COMPARISON:  03/29/2013   FINDINGS: Lower chest: No acute findings.   Hepatobiliary: No mass visualized on this unenhanced exam. Prior cholecystectomy. No evidence of biliary obstruction.   Pancreas: No mass or inflammatory process visualized on this unenhanced exam.   Spleen:  Within normal limits in size.   Adrenals/Urinary tract: No evidence of urolithiasis or hydronephrosis. Unremarkable unopacified urinary bladder.   Stomach/Bowel: No evidence of obstruction, inflammatory process, or abnormal fluid collections. Normal appendix visualized. Colonic diverticulosis is noted, without evidence of diverticulitis.   Vascular/Lymphatic: No pathologically enlarged lymph nodes identified. No evidence of abdominal aortic aneurysm. Aortic atherosclerosis.   Reproductive: Prior hysterectomy noted. Adnexal regions are unremarkable in appearance.   Other:  None.   Musculoskeletal:  No suspicious bone lesions identified.   IMPRESSION: Colonic diverticulosis, without radiographic evidence of diverticulitis or other acute findings.   Aortic atherosclerosis.     Electronically Signed   By: Earle Gell M.D.   On: 09/21/2017 16:22        Assessment & Plan:  67 year old female with a history of IBS, constipation with abdominal bloating, history of gastritis and Candida esophagitis who is here for follow-up.  1.  Constipation/anal fissure --anal fissure had improved but  exacerbated recently.  Begin MiraLAX 17 g daily.  Can also restart nitroglycerin ointment 3 times daily until fissure is better.  Notify me  if MiraLAX is not helping her constipation.  2.  GERD --continue pantoprazole 40 mg once daily for now  3.  Dysphagia --likely resulting from tissue swelling and hardware placement after cervical disc/neck surgery.  Continue soft diet, advance as tolerated.  4.  Abdominal pain --resolved, reassuring CT scan which I reviewed and we reviewed together.  5.  CRC screening --normal colonoscopy 2013, normal Cologuard 2018, repeat Cologuard in 2021 or repeat screening colonoscopy in 2023  25 minutes spent with the patient today. Greater than 50% was spent in counseling and coordination of care with the patient

## 2017-12-01 NOTE — Patient Instructions (Addendum)
Normal BMI (Body Mass Index- based on height and weight) is between 19 and 25. Your BMI today is Body mass index is 31.18 kg/m. Marland Kitchen Please consider follow up  regarding your BMI with your Primary Care Provider.  We have sent medications to your pharmacy for you to pick up at your convenience:  Please purchase the following medications over the counter and take as directed:miralax.

## 2017-12-11 ENCOUNTER — Other Ambulatory Visit: Payer: Self-pay

## 2017-12-11 ENCOUNTER — Encounter: Payer: Self-pay | Admitting: Internal Medicine

## 2017-12-11 ENCOUNTER — Ambulatory Visit (INDEPENDENT_AMBULATORY_CARE_PROVIDER_SITE_OTHER): Payer: Medicare HMO | Admitting: Internal Medicine

## 2017-12-11 VITALS — BP 172/84 | HR 76 | Temp 98.3°F | Ht 63.0 in | Wt 179.2 lb

## 2017-12-11 DIAGNOSIS — Z79899 Other long term (current) drug therapy: Secondary | ICD-10-CM | POA: Diagnosis not present

## 2017-12-11 DIAGNOSIS — J45909 Unspecified asthma, uncomplicated: Secondary | ICD-10-CM

## 2017-12-11 DIAGNOSIS — I10 Essential (primary) hypertension: Secondary | ICD-10-CM

## 2017-12-11 DIAGNOSIS — J0101 Acute recurrent maxillary sinusitis: Secondary | ICD-10-CM

## 2017-12-11 DIAGNOSIS — F419 Anxiety disorder, unspecified: Secondary | ICD-10-CM | POA: Diagnosis not present

## 2017-12-11 DIAGNOSIS — Z7951 Long term (current) use of inhaled steroids: Secondary | ICD-10-CM | POA: Diagnosis not present

## 2017-12-11 DIAGNOSIS — I5032 Chronic diastolic (congestive) heart failure: Secondary | ICD-10-CM

## 2017-12-11 DIAGNOSIS — I110 Hypertensive heart disease with heart failure: Secondary | ICD-10-CM | POA: Diagnosis not present

## 2017-12-11 DIAGNOSIS — I2510 Atherosclerotic heart disease of native coronary artery without angina pectoris: Secondary | ICD-10-CM | POA: Diagnosis not present

## 2017-12-11 MED ORDER — DOXYCYCLINE HYCLATE 100 MG PO CAPS: 100.0000 mg | ORAL_CAPSULE | Freq: Two times a day (BID) | ORAL | 0 refills | Status: DC
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
## 2017-12-11 NOTE — Assessment & Plan Note (Addendum)
Assessment S/s consistent with acute sinusitis. No wheezing or crackles on exam to suggest asthma or pneumonia. Low suspicion for flu and she is out of the window for oseltamivir. Will provide antibiotic therapy for acute sinusitis given initial improvement in symptoms with recent worsening. She is allergic to multiple antibiotics, including amoxicillin. Has a history of recurrent sinusitis, may benefit from ENT referral for polyps/structural issues contributing to her recurrent infections  Plan - Doxycycline 100mg  BID x7 days - Patient instructed to return to clinic if no improvement with antibiotic therapy - Consider ENT referral if sinus infections continue to recur - Continue symptomatic treatment with OTC cough drops and flonase

## 2017-12-11 NOTE — Assessment & Plan Note (Signed)
Assessment BP elevated to 172/84 today. She reports compliance with metoprolol 100 mg twice a day and losartan 100 mg daily. denies chest pain. Will not make any changes to her medication regimen in the setting of acute illness.  Plan - Continue metoprolol 100mg  BID and losartan 100mg  daily

## 2017-12-11 NOTE — Patient Instructions (Addendum)
FOLLOW-UP INSTRUCTIONS When: 2 months For: BP management What to bring: medications   [name],  It was a pleasure to meet you today.  For your cough and congestion, you most likely have a sinus infection. Please continue all the symptom treatments you are doing. I am also prescribing an antibiotic. Please take doxycycline 100mg  twice a day for 7 days.  For your leg swelling, you can use compression stockings. You can also help the swelling by keeping your legs elevated when you are sitting down. Your primary doctor can continue to work with you on whether other medicines are needed.  Please return to clinic in ~2 months to see your primary doctor.

## 2017-12-11 NOTE — Assessment & Plan Note (Signed)
Assessment TTE in 2017 showed normal LVEF and grade 1 DD. She has 1+ BLE edema without crackles on exam. She states that she was given a prescription for lasix 20mg  every other day, however she was then told that she didn't need this medication anymore and it was subsequently discontinued. She did report improvement in her symptoms with lasix. On chart review, I do not see a reason for the discontinuation of her lasix. Will start with conservative therapies and have her follow up with PCP for continued care.  Plan - Compression stockings PRN - Recommended elevation of legs while sitting - Can consider adding furosemide at next visit if no improvement - F/u with PCP in ~2 months

## 2017-12-13 DIAGNOSIS — M4802 Spinal stenosis, cervical region: Secondary | ICD-10-CM | POA: Diagnosis not present

## 2017-12-14 NOTE — Progress Notes (Signed)
Internal Medicine Clinic Attending  Case discussed with Dr. Huang at the time of the visit.  We reviewed the resident's history and exam and pertinent patient test results.  I agree with the assessment, diagnosis, and plan of care documented in the resident's note. 

## 2017-12-24 ENCOUNTER — Encounter (HOSPITAL_COMMUNITY): Payer: Self-pay | Admitting: Emergency Medicine

## 2017-12-24 ENCOUNTER — Emergency Department (HOSPITAL_COMMUNITY)
Admission: EM | Admit: 2017-12-24 | Discharge: 2017-12-24 | Disposition: A | Discharge status: Home / Self Care | Payer: Medicare HMO | Attending: Emergency Medicine | Admitting: Emergency Medicine

## 2017-12-24 ENCOUNTER — Emergency Department (HOSPITAL_COMMUNITY): Payer: Medicare HMO

## 2017-12-24 DIAGNOSIS — Z91040 Latex allergy status: Secondary | ICD-10-CM | POA: Insufficient documentation

## 2017-12-24 DIAGNOSIS — J45909 Unspecified asthma, uncomplicated: Secondary | ICD-10-CM | POA: Insufficient documentation

## 2017-12-24 DIAGNOSIS — M545 Low back pain: Secondary | ICD-10-CM | POA: Diagnosis not present

## 2017-12-24 DIAGNOSIS — W010XXA Fall on same level from slipping, tripping and stumbling without subsequent striking against object, initial encounter: Secondary | ICD-10-CM | POA: Insufficient documentation

## 2017-12-24 DIAGNOSIS — Z8673 Personal history of transient ischemic attack (TIA), and cerebral infarction without residual deficits: Secondary | ICD-10-CM | POA: Insufficient documentation

## 2017-12-24 DIAGNOSIS — Y9389 Activity, other specified: Secondary | ICD-10-CM | POA: Diagnosis not present

## 2017-12-24 DIAGNOSIS — M25572 Pain in left ankle and joints of left foot: Secondary | ICD-10-CM | POA: Diagnosis not present

## 2017-12-24 DIAGNOSIS — F419 Anxiety disorder, unspecified: Secondary | ICD-10-CM | POA: Diagnosis not present

## 2017-12-24 DIAGNOSIS — Z9049 Acquired absence of other specified parts of digestive tract: Secondary | ICD-10-CM | POA: Diagnosis not present

## 2017-12-24 DIAGNOSIS — Z853 Personal history of malignant neoplasm of breast: Secondary | ICD-10-CM | POA: Diagnosis not present

## 2017-12-24 DIAGNOSIS — S93402A Sprain of unspecified ligament of left ankle, initial encounter: Secondary | ICD-10-CM | POA: Diagnosis not present

## 2017-12-24 DIAGNOSIS — I2510 Atherosclerotic heart disease of native coronary artery without angina pectoris: Secondary | ICD-10-CM | POA: Diagnosis not present

## 2017-12-24 DIAGNOSIS — Z7982 Long term (current) use of aspirin: Secondary | ICD-10-CM | POA: Insufficient documentation

## 2017-12-24 DIAGNOSIS — S99912A Unspecified injury of left ankle, initial encounter: Secondary | ICD-10-CM | POA: Diagnosis not present

## 2017-12-24 DIAGNOSIS — Z87891 Personal history of nicotine dependence: Secondary | ICD-10-CM | POA: Insufficient documentation

## 2017-12-24 DIAGNOSIS — M25551 Pain in right hip: Secondary | ICD-10-CM | POA: Diagnosis not present

## 2017-12-24 DIAGNOSIS — M79672 Pain in left foot: Secondary | ICD-10-CM | POA: Diagnosis not present

## 2017-12-24 DIAGNOSIS — F329 Major depressive disorder, single episode, unspecified: Secondary | ICD-10-CM | POA: Insufficient documentation

## 2017-12-24 DIAGNOSIS — S79911A Unspecified injury of right hip, initial encounter: Secondary | ICD-10-CM | POA: Diagnosis not present

## 2017-12-24 DIAGNOSIS — Y929 Unspecified place or not applicable: Secondary | ICD-10-CM | POA: Diagnosis not present

## 2017-12-24 DIAGNOSIS — S99922A Unspecified injury of left foot, initial encounter: Secondary | ICD-10-CM | POA: Diagnosis not present

## 2017-12-24 DIAGNOSIS — Y998 Other external cause status: Secondary | ICD-10-CM | POA: Insufficient documentation

## 2017-12-24 DIAGNOSIS — M25552 Pain in left hip: Secondary | ICD-10-CM | POA: Diagnosis not present

## 2017-12-24 DIAGNOSIS — S79912A Unspecified injury of left hip, initial encounter: Secondary | ICD-10-CM | POA: Diagnosis not present

## 2017-12-24 DIAGNOSIS — Z79899 Other long term (current) drug therapy: Secondary | ICD-10-CM | POA: Insufficient documentation

## 2017-12-24 DIAGNOSIS — M79602 Pain in left arm: Secondary | ICD-10-CM | POA: Diagnosis not present

## 2017-12-24 DIAGNOSIS — M7918 Myalgia, other site: Secondary | ICD-10-CM

## 2017-12-24 MED ORDER — METHOCARBAMOL 500 MG PO TABS: 500.0000 mg | ORAL_TABLET | Freq: Every evening | ORAL | 0 refills | Status: DC | PRN

## 2017-12-24 MED ORDER — ACETAMINOPHEN 500 MG PO TABS
1000.0000 mg | ORAL_TABLET | Freq: Once | ORAL | Status: AC
  Administered 2017-12-24: 1000 mg via ORAL
  Filled 2017-12-24: qty 2

## 2017-12-24 NOTE — Discharge Instructions (Signed)
Take Tylenol as needed for pain.  You may take up to 1000 mg 3 times a day. Use Robaxin as needed for continued muscle pain.  Have caution, as this may make you tired or groggy.  Do not drive or operate heavy machinery while taking this medicine. Ice your ankle, and try and rest over the next several days.  Keep it elevated. Use heat on your back to help with muscle soreness. Follow-up with your primary care doctor if your symptoms are not improving. Return to the emergency room if you develop numbness, loss of bowel or bladder control, severe pain, or any new or concerning symptoms.

## 2017-12-24 NOTE — ED Triage Notes (Signed)
Pt reports that she fell yesterday trying to show her granddaughter pictures. Her mother is admitted up stairs and nurse upstairs told her to come to ED to get her foot looked at bc has bruising.
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
## 2018-01-02 ENCOUNTER — Encounter: Payer: Self-pay | Admitting: Nurse Practitioner

## 2018-01-02 ENCOUNTER — Ambulatory Visit (INDEPENDENT_AMBULATORY_CARE_PROVIDER_SITE_OTHER): Payer: Medicare HMO | Admitting: Nurse Practitioner

## 2018-01-02 VITALS — BP 150/88 | HR 71 | Temp 98.1°F | Ht 63.0 in | Wt 176.0 lb

## 2018-01-02 DIAGNOSIS — K2100 Gastro-esophageal reflux disease with esophagitis, without bleeding: Secondary | ICD-10-CM

## 2018-01-02 DIAGNOSIS — R1312 Dysphagia, oropharyngeal phase: Secondary | ICD-10-CM

## 2018-01-02 DIAGNOSIS — J309 Allergic rhinitis, unspecified: Secondary | ICD-10-CM

## 2018-01-02 MED ORDER — FLUCONAZOLE 200 MG PO TABS: 200.0000 mg | ORAL_TABLET | Freq: Once | ORAL | 0 refills | Status: AC

## 2018-01-02 MED ORDER — IPRATROPIUM BROMIDE 0.03 % NA SOLN: 2.0000 | Freq: Two times a day (BID) | NASAL | 0 refills | Status: DC

## 2018-01-02 MED ORDER — NYSTATIN 100000 UNIT/ML MT SUSP: 5.0000 mL | Freq: Four times a day (QID) | OROMUCOSAL | 0 refills | Status: DC

## 2018-01-02 NOTE — Patient Instructions (Addendum)
Please contact Dr. Hilarie Fredrickson to re evaluate dysphagia.  Maintain mechanical soft diet and small bites.  Dysphagia Dysphagia is trouble swallowing. This condition occurs when solids and liquids stick in a person's throat on the way down to the stomach, or when food takes longer to get to the stomach. You may have problems swallowing food, liquids, or both. You may also have pain while trying to swallow. It may take you more time and effort to swallow something. What are the causes? This condition is caused by:  Problems with the muscles. They may make it difficult for you to move food and liquids through the tube that connects your mouth to your stomach (esophagus). You may have ulcers, scar tissue, or inflammation that blocks the normal passage of food and liquids. Causes of these problems include: ? Acid reflux from your stomach into your esophagus (gastroesophageal reflux). ? Infections. ? Radiation treatment for cancer. ? Medicines taken without enough fluids to wash them down into your stomach.  Nerve problems. These prevent signals from being sent to the muscles of your esophagus to squeeze (contract) and move what you swallow down to your stomach.  Globus pharyngeus. This is a common problem that involves feeling like something is stuck in the throat or a sense of trouble with swallowing even though nothing is wrong with the swallowing passages.  Stroke. This can affect the nerves and make it difficult to swallow.  Certain conditions, such as cerebral palsy or Parkinson disease.  What are the signs or symptoms? Common symptoms of this condition include:  A feeling that solids or liquids are stuck in your throat on the way down to the stomach.  Food taking too long to get to the stomach.  Other symptoms include:  Food moving back from your stomach to your mouth (regurgitation).  Noises coming from your throat.  Chest discomfort with swallowing.  A feeling of fullness when  swallowing.  Drooling, especially when the throat is blocked.  Pain while swallowing.  Heartburn.  Coughing or gagging while trying to swallow.  How is this diagnosed? This condition is diagnosed by:  Barium X-ray. In this test, you swallow a white substance (contrast medium)that sticks to the inside of your esophagus. X-ray images are then taken.  Endoscopy. In this test, a flexible telescope is inserted down your throat to look at your esophagus and your stomach.  CT scans and MRI.  How is this treated? Treatment for dysphagia depends on the cause of the condition:  If the dysphagia is caused by acid reflux or infection, medicines may be used. They may include antibiotics and heartburn medicines.  If the dysphagia is caused by problems with your muscles, swallowing therapy may be used to help you strengthen your swallowing muscles. You may have to do specific exercises to strengthen the muscles or stretch them.  If the dysphagia is caused by a blockage or mass, procedures to remove the blockage may be done. You may need surgery and a feeding tube.  You may need to make diet changes. Ask your health care provider for specific instructions. Follow these instructions at home: Eating and drinking  Try to eat soft food that is easier to swallow.  Follow any diet changes as told by your health care provider.  Cut your food into small pieces and eat slowly.  Eat and drink only when you are sitting upright.  Do not drink alcohol or caffeine. If you need help quitting, ask your health care provider. General instructions  Check your weight every day to make sure you are not losing weight.  Take over-the-counter and prescription medicines only as told by your health care provider.  If you were prescribed an antibiotic medicine, take it as told by your health care provider. Do not stop taking the antibiotic even if you start to feel better.  Do not use any products that contain  nicotine or tobacco, such as cigarettes and e-cigarettes. If you need help quitting, ask your health care provider.  Keep all follow-up visits as told by your health care provider. This is important. Contact a health care provider if:  You lose weight because you cannot swallow.  You cough when you drink liquids (aspiration).  You cough up partially digested food. Get help right away if:  You cannot swallow your saliva.  You have shortness of breath or a fever, or both.  You have a hoarse voice and also have trouble swallowing. Summary  Dysphagia is trouble swallowing. This condition occurs when solids and liquids stick in a person's throat on the way down to the stomach, or when food takes longer to get to the stomach.  Dysphagia has many possible causes and symptoms.  Treatment for dysphagia depends on the cause of the condition. This information is not intended to replace advice given to you by your health care provider. Make sure you discuss any questions you have with your health care provider. Document Released: 08/26/2000 Document Revised: 08/18/2016 Document Reviewed: 08/18/2016 Elsevier Interactive Patient Education  2017 [employer].

## 2018-01-02 NOTE — Progress Notes (Signed)
Subjective:  Patient ID: [name], female    DOB: 1951/02/19  Age: 67 y.o. MRN: [id number]  CC: Establish Care (est care. has had a sore throat for 10 days, feels tight, hard to take meds.)   Sore Throat   This is a recurrent problem. The current episode started more than 1 month ago. The problem has been waxing and waning. There has been no fever. Associated symptoms include congestion, a hoarse voice and trouble swallowing. Pertinent negatives include no abdominal pain, coughing, diarrhea, drooling, ear discharge, ear pain, headaches, plugged ear sensation, neck pain, shortness of breath, stridor, swollen glands or vomiting. She has had no exposure to strep or mono. Treatments tried: oral abx. The treatment provided mild relief.  treated with doxycycline, minimal improvement. Reports white to yellow mucus and sour taste in mouth in morning. Reports hx of esophageal stricture with dilatation done (uable to remember date).  Hx of candida oropharyngeal per endoscopy 2016, treated with diflucan. Current use of ICS daily, reports she rinses mouth after each use.  Denies tobacco or ETOH use.  Outpatient Medications Prior to Visit  Medication Sig Dispense Refill  . acetaminophen (TYLENOL) 500 MG tablet Take 500-1,000 mg by mouth every 6 (six) hours as needed for moderate pain.     Marland Kitchen ALPRAZolam (XANAX) 1 MG tablet Take 1 tablet (1 mg total) by mouth 2 (two) times daily as needed. for anxiety 60 tablet 1  . AMBULATORY NON FORMULARY MEDICATION Medication Name: nitroglycerin 0.125% gel. Apply a pea size amount to your rectum three times daily x 6-8 weeks, or until fissure feels better. (Patient taking differently: Place 1 application rectally 3 (three) times daily. Medication Name: nitroglycerin 0.125% gel. Apply a pea size amount to your rectum three times daily x 6-8 weeks, or until fissure feels better.) 30 g 0  . ascorbic acid (VITAMIN C) 1000 MG tablet Take 1,000 mg by mouth daily.     Marland Kitchen  aspirin EC 81 MG tablet Take 81 mg by mouth daily.    Marland Kitchen BIOTIN PO Take 1 tablet by mouth every morning. Hair Skin and Nails    . Calcium Carbonate-Vitamin D (CALCIUM-CARB 600 + D) 600-125 MG-UNIT TABS Take 1 tablet by mouth daily.     . cetirizine (ZYRTEC) 10 MG tablet Take 1 tablet (10 mg total) by mouth daily. 90 tablet 3  . FLOVENT HFA 110 MCG/ACT inhaler Inhale 2 puffs into the lungs 2 (two) times daily.     . fluticasone (FLONASE) 50 MCG/ACT nasal spray Place 1-2 sprays into both nostrils daily. (Patient taking differently: Place 1-2 sprays into both nostrils daily as needed for allergies or rhinitis. ) 16 g 3  . Fluticasone-Salmeterol (ADVAIR DISKUS) 100-50 MCG/DOSE AEPB Inhale 2 puffs into the lungs 2 (two) times daily.     . hydrocortisone (ANUSOL-HC) 25 MG suppository Place 1 suppository (25 mg total) rectally 2 (two) times daily. 12 suppository 0  . hydroxypropyl methylcellulose / hypromellose (ISOPTO TEARS / GONIOVISC) 2.5 % ophthalmic solution Place 1 drop into the right eye 3 (three) times daily as needed for dry eyes.    Marland Kitchen losartan (COZAAR) 100 MG tablet Take 1 tablet (100 mg total) by mouth daily. 90 tablet 1  . magnesium chloride (SLOW-MAG) 64 MG TBEC SR tablet Take 2 tablets (128 mg total) by mouth daily. 15 tablet 0  . metoprolol tartrate (LOPRESSOR) 100 MG tablet Take 1 tablet (100 mg total) by mouth 2 (two) times daily. 180 tablet 1  .  nitroGLYCERIN (NITROSTAT) 0.4 MG SL tablet Place 0.4 mg under the tongue every 5 (five) minutes as needed for chest pain.     . pantoprazole (PROTONIX) 40 MG tablet Take 1 tablet (40 mg total) by mouth daily. 90 tablet 3  . potassium chloride (K-DUR) 10 MEQ tablet TAKE 2 TABLETS BY MOUTH EVERY OTHER DAY. CAN BREAK TABLETS IN HALF OR DISSOLVE IN WATER. 184 tablet 0  . pravastatin (PRAVACHOL) 40 MG tablet TAKE 1 TABLET BY MOUTH IN THE EVENING 90 tablet 1  . RESTASIS 0.05 % ophthalmic emulsion Place 1 drop into both eyes 2 (two) times daily.  3  .  vitamin E (VITAMIN E) 400 UNIT capsule Take 400 Units by mouth daily.    Marland Kitchen doxycycline (VIBRAMYCIN) 100 MG capsule Take 1 capsule (100 mg total) by mouth 2 (two) times daily. 14 capsule 0  . methocarbamol (ROBAXIN) 500 MG tablet Take 1 tablet (500 mg total) by mouth at bedtime as needed for muscle spasms. 8 tablet 0   No facility-administered medications prior to visit.     ROS See HPI  Objective:  BP (!) 150/88 (BP Location: Left Arm, Patient Position: Sitting, Cuff Size: Normal)   Pulse 71   Temp 98.1 F (36.7 C) (Oral)   Ht 5\' 3"  (1.6 m)   Wt 176 lb (79.8 kg)   SpO2 96%   BMI 31.18 kg/m   BP Readings from Last 3 Encounters:  01/02/18 (!) 150/88  12/24/17 (!) 178/91  12/11/17 (!) 172/84    Wt Readings from Last 3 Encounters:  01/02/18 176 lb (79.8 kg)  12/24/17 176 lb (79.8 kg)  12/11/17 179 lb 3.2 oz (81.3 kg)    Physical Exam  Constitutional: No distress.  HENT:  Right Ear: Tympanic membrane, external ear and ear canal normal. No middle ear effusion.  Left Ear: Tympanic membrane, external ear and ear canal normal.  No middle ear effusion.  Nose: Mucosal edema present. No rhinorrhea. Right sinus exhibits no maxillary sinus tenderness and no frontal sinus tenderness. Left sinus exhibits no maxillary sinus tenderness and no frontal sinus tenderness.  Mouth/Throat: Uvula is midline and oropharynx is clear and moist. No oropharyngeal exudate or posterior oropharyngeal erythema.  Eyes: Conjunctivae and EOM are normal.  Neck: Normal range of motion. Neck supple. No thyromegaly present.  Cardiovascular: Normal rate.  Pulmonary/Chest: Effort normal and breath sounds normal.  Lymphadenopathy:    She has no cervical adenopathy.  Vitals reviewed.   Lab Results  Component Value Date   WBC 4.7 11/06/2017   HGB 12.2 11/06/2017   HCT 37.9 11/06/2017   PLT 207 11/06/2017   GLUCOSE 108 (H) 11/06/2017   CHOL 181 02/15/2016   TRIG 154 (H) 02/15/2016   HDL 49 02/15/2016    LDLCALC 101 (H) 02/15/2016   ALT 17 09/03/2017   AST 20 09/03/2017   NA 140 11/06/2017   K 4.1 11/06/2017   CL 107 11/06/2017   CREATININE 0.76 11/06/2017   BUN 18 11/06/2017   CO2 25 11/06/2017   TSH 1.940 05/04/2016   INR 1.02 08/17/2013   HGBA1C 6.1 10/31/2017    Dg Ankle Complete Left  Result Date: 12/24/2017 CLINICAL DATA:  [place] yesterday, LEFT foot and ankle pain EXAM: LEFT ANKLE COMPLETE - 3+ VIEW COMPARISON:  None FINDINGS: Osseous mineralization normal. Joint spaces preserved. No fracture, dislocation, or bone destruction. Tiny plantar calcaneal spur. IMPRESSION: No acute abnormalities. Electronically Signed   By: Lavonia Dana M.D.   On: 12/24/2017 11:25  Dg Foot Complete Left  Result Date: 12/24/2017 CLINICAL DATA:  [place] yesterday, LEFT foot and ankle pain EXAM: LEFT FOOT - COMPLETE 3+ VIEW COMPARISON:  None FINDINGS: Osseous mineralization normal. Joint spaces preserved. No fracture, dislocation, or bone destruction. Tiny plantar calcaneal spur. IMPRESSION: No acute osseous abnormalities. Electronically Signed   By: Lavonia Dana M.D.   On: 12/24/2017 11:24   Dg Hips Bilat W Or Wo Pelvis 3-4 Views  Result Date: 12/24/2017 CLINICAL DATA:  Pain following fall EXAM: DG HIP (WITH OR WITHOUT PELVIS) 3-4V BILAT COMPARISON:  None. FINDINGS: Frontal pelvis as well as frontal and lateral hips bilaterally-total five views-obtained. No fracture or dislocation. Joint spaces appear normal. No erosive change. IMPRESSION: No fracture or dislocation.  No evident arthropathy. Electronically Signed   By: Lowella Grip III M.D.   On: 12/24/2017 14:45    Assessment & Plan:   Blanca was seen today for establish care.  Diagnoses and all orders for this visit:  Gastroesophageal reflux disease with esophagitis -     nystatin (MYCOSTATIN) 100000 UNIT/ML suspension; Take 5 mLs (500,000 Units total) by mouth 4 (four) times daily.  Oropharyngeal dysphagia -     fluconazole (DIFLUCAN) 200 MG  tablet; Take 1 tablet (200 mg total) by mouth once for 1 dose.  Allergic rhinitis, unspecified seasonality, unspecified trigger -     ipratropium (ATROVENT) 0.03 % nasal spray; Place 2 sprays into both nostrils 2 (two) times daily. Do not use for more than 5days.   I have discontinued Jeani Hawking E. Roberts's doxycycline and methocarbamol. I am also having her start on nystatin, ipratropium, and fluconazole. Additionally, I am having her maintain her Calcium Carbonate-Vitamin D, vitamin E, ascorbic acid, aspirin EC, acetaminophen, nitroGLYCERIN, BIOTIN PO, fluticasone, magnesium chloride, cetirizine, hydrocortisone, AMBULATORY NON FORMULARY MEDICATION, hydroxypropyl methylcellulose / hypromellose, losartan, metoprolol tartrate, pravastatin, potassium chloride, ALPRAZolam, pantoprazole, RESTASIS, FLOVENT HFA, and Fluticasone-Salmeterol.  Meds ordered this encounter  Medications  . nystatin (MYCOSTATIN) 100000 UNIT/ML suspension    Sig: Take 5 mLs (500,000 Units total) by mouth 4 (four) times daily.    Dispense:  200 mL    Refill:  0    Order Specific Question:   Supervising Provider    Answer:   Lucille Passy [[id number]]  . ipratropium (ATROVENT) 0.03 % nasal spray    Sig: Place 2 sprays into both nostrils 2 (two) times daily. Do not use for more than 5days.    Dispense:  30 mL    Refill:  0    Order Specific Question:   Supervising Provider    Answer:   Lucille Passy [[id number]]  . fluconazole (DIFLUCAN) 200 MG tablet    Sig: Take 1 tablet (200 mg total) by mouth once for 1 dose.    Dispense:  1 tablet    Refill:  0    Order Specific Question:   Supervising Provider    Answer:   Lucille Passy [[id number]]    Follow-up: Return in about 1 month (around 01/30/2018) for CPE (fasting).  Wilfred Lacy, NP

## 2018-01-09 ENCOUNTER — Ambulatory Visit (INDEPENDENT_AMBULATORY_CARE_PROVIDER_SITE_OTHER): Payer: Medicare HMO | Admitting: Psychiatry

## 2018-01-09 DIAGNOSIS — F4323 Adjustment disorder with mixed anxiety and depressed mood: Secondary | ICD-10-CM

## 2018-01-11 DIAGNOSIS — M4802 Spinal stenosis, cervical region: Secondary | ICD-10-CM | POA: Diagnosis not present

## 2018-01-19 ENCOUNTER — Telehealth: Payer: Self-pay | Admitting: Nurse Practitioner

## 2018-01-19 NOTE — Telephone Encounter (Signed)
So by looking at this we cannot give anything pertaining to heart issue as was not see for that by Wilfred Lacy, NP/the only thing we can do is offer a current medication list/the patient was seen on 3.11.19 by Dr. Tarri Abernethy for a hospital Follow-Up pertaining to this/then after that was seen by a new provider/then seen by Wilfred Lacy, NP to establish care/patient will need to sign a release form asking for the note dating 3.11.19 by Dr. Tarri Abernethy to be sent to Dr. Ivin Poot, DDS/I tried to call the pt and LMOVM stating to RTN call stating that this request is a more involved than what she is asking and that everything can be explained when she calls/PEC please give this information to patient/thx dmf

## 2018-01-19 NOTE — Telephone Encounter (Signed)
Please help.      Copied from [place] [phone]. Topic: General - Other >> Jan 19, 2018 10:35 AM Oneta Rack wrote: Relation to pt: self Call back number: [phone]  Reason for call:  Patient requesting current medication list and office notes pertaining to patient mild heart disease please fax to [phone] Dr. Ivin Poot, [address], [place], [place] [address] [phone]. Informed patient medical release may be required, patient would like a follow call, please advise >> Jan 19, 2018 10:49 AM Oneta Rack wrote: Relation to pt: self Call back number: [phone]  Reason for call:  Patient requesting current medication list and office notes pertaining to patient mild heart disease please fax to [phone] Dr. Ivin Poot, [address], [place], [place] [address] [phone]. Informed patient medical release may be required, patient would like a follow call, please advise

## 2018-01-23 DIAGNOSIS — M545 Low back pain: Secondary | ICD-10-CM | POA: Diagnosis not present

## 2018-01-24 ENCOUNTER — Other Ambulatory Visit: Payer: Self-pay | Admitting: Internal Medicine

## 2018-01-30 ENCOUNTER — Other Ambulatory Visit (INDEPENDENT_AMBULATORY_CARE_PROVIDER_SITE_OTHER): Payer: Medicare HMO

## 2018-01-30 ENCOUNTER — Ambulatory Visit (INDEPENDENT_AMBULATORY_CARE_PROVIDER_SITE_OTHER): Payer: Medicare HMO | Admitting: Nurse Practitioner

## 2018-01-30 ENCOUNTER — Encounter: Payer: Self-pay | Admitting: Nurse Practitioner

## 2018-01-30 VITALS — BP 146/86 | HR 70 | Temp 98.2°F | Ht 63.0 in | Wt 172.6 lb

## 2018-01-30 DIAGNOSIS — R269 Unspecified abnormalities of gait and mobility: Secondary | ICD-10-CM | POA: Diagnosis not present

## 2018-01-30 DIAGNOSIS — R2681 Unsteadiness on feet: Secondary | ICD-10-CM

## 2018-01-30 DIAGNOSIS — E782 Mixed hyperlipidemia: Secondary | ICD-10-CM

## 2018-01-30 DIAGNOSIS — M6281 Muscle weakness (generalized): Secondary | ICD-10-CM

## 2018-01-30 DIAGNOSIS — E538 Deficiency of other specified B group vitamins: Secondary | ICD-10-CM

## 2018-01-30 DIAGNOSIS — R7303 Prediabetes: Secondary | ICD-10-CM

## 2018-01-30 DIAGNOSIS — Z79899 Other long term (current) drug therapy: Secondary | ICD-10-CM

## 2018-01-30 DIAGNOSIS — R400 Somnolence: Secondary | ICD-10-CM

## 2018-01-30 DIAGNOSIS — Z23 Encounter for immunization: Secondary | ICD-10-CM

## 2018-01-30 DIAGNOSIS — J309 Allergic rhinitis, unspecified: Secondary | ICD-10-CM | POA: Diagnosis not present

## 2018-01-30 DIAGNOSIS — F411 Generalized anxiety disorder: Secondary | ICD-10-CM | POA: Diagnosis not present

## 2018-01-30 DIAGNOSIS — I10 Essential (primary) hypertension: Secondary | ICD-10-CM

## 2018-01-30 DIAGNOSIS — E559 Vitamin D deficiency, unspecified: Secondary | ICD-10-CM | POA: Diagnosis not present

## 2018-01-30 LAB — LIPID PANEL
CHOL/HDL RATIO: 4
Cholesterol: 145 mg/dL (ref 0–200)
HDL: 38.4 mg/dL — ABNORMAL LOW (ref >39.00)
LDL CALC: 69 mg/dL (ref 0–99)
NonHDL: 106.77
Triglycerides: 189 mg/dL — ABNORMAL HIGH (ref 0.0–149.0)
VLDL: 37.8 mg/dL (ref 0.0–40.0)

## 2018-01-30 LAB — COMPREHENSIVE METABOLIC PANEL
ALT: 10 U/L (ref 0–35)
AST: 11 U/L (ref 0–37)
Albumin: 4.2 g/dL (ref 3.5–5.2)
Alkaline Phosphatase: 61 U/L (ref 39–117)
BILIRUBIN TOTAL: 0.7 mg/dL (ref 0.2–1.2)
BUN: 22 mg/dL (ref 6–23)
CHLORIDE: 104 meq/L (ref 96–112)
CO2: 30 meq/L (ref 19–32)
CREATININE: 0.76 mg/dL (ref 0.40–1.20)
Calcium: 10 mg/dL (ref 8.4–10.5)
GFR: 81.24 mL/min (ref >60.00)
Glucose, Bld: 111 mg/dL — ABNORMAL HIGH (ref 70–99)
Potassium: 4.1 mEq/L (ref 3.5–5.1)
Sodium: 141 mEq/L (ref 135–145)
Total Protein: 6.7 g/dL (ref 6.0–8.3)

## 2018-01-30 LAB — CK: Total CK: 63 U/L (ref 7–177)

## 2018-01-30 LAB — VITAMIN B12: VITAMIN B 12: 207 pg/mL — AB (ref 211–911)

## 2018-01-30 LAB — TSH: TSH: 0.71 u[IU]/mL (ref 0.35–4.50)

## 2018-01-30 LAB — HEMOGLOBIN A1C: HEMOGLOBIN A1C: 5.9 % (ref 4.6–6.5)

## 2018-01-30 MED ORDER — IPRATROPIUM BROMIDE 0.03 % NA SOLN: 2.0000 | Freq: Two times a day (BID) | NASAL | 1 refills | Status: DC

## 2018-01-30 MED ORDER — HYDROCORTISONE ACETATE 25 MG RE SUPP: 25.0000 mg | Freq: Two times a day (BID) | RECTAL | 0 refills | Status: DC

## 2018-01-30 MED ORDER — ALPRAZOLAM 1 MG PO TABS: ORAL_TABLET | ORAL | 0 refills | Status: DC
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
## 2018-01-30 NOTE — Assessment & Plan Note (Signed)
Due to current complain of unsteady gait, falls and daytime somnolence; I plan to slowly wean xanax to minimal dose tolerable and add buspar. F/up in 63month.

## 2018-01-30 NOTE — Patient Instructions (Addendum)
Due to concerns of possible over sedation, advised not to use xanax and drive. she is to decrease daytime dose to half tab during the day.  You will be contacted to schedule appt for sleep study.  You will be contacted to schedule physical therapy due to unsteady gait.  Stable CMP, TSH, Hgb A1c. Mild decrease in Vitamin B12. Rx sent. Lipid panel indicates persistent elevated triglyceride. Continue pravastatin.

## 2018-01-30 NOTE — Assessment & Plan Note (Signed)
Due to current complain of unsteady gait, falls and daytime somnolence; I plan to slowly wean xanax to minimal dose tolerable and add buspar. F/up in 96month.

## 2018-01-31 ENCOUNTER — Encounter: Payer: Self-pay | Admitting: Neurology

## 2018-01-31 ENCOUNTER — Ambulatory Visit (INDEPENDENT_AMBULATORY_CARE_PROVIDER_SITE_OTHER): Payer: Medicare HMO | Admitting: Neurology

## 2018-01-31 VITALS — BP 161/86 | HR 67 | Ht 63.0 in | Wt 173.0 lb

## 2018-01-31 DIAGNOSIS — E669 Obesity, unspecified: Secondary | ICD-10-CM

## 2018-01-31 DIAGNOSIS — R51 Headache: Secondary | ICD-10-CM | POA: Diagnosis not present

## 2018-01-31 DIAGNOSIS — Z820 Family history of epilepsy and other diseases of the nervous system: Secondary | ICD-10-CM

## 2018-01-31 DIAGNOSIS — R519 Headache, unspecified: Secondary | ICD-10-CM

## 2018-01-31 DIAGNOSIS — R609 Edema, unspecified: Secondary | ICD-10-CM | POA: Diagnosis not present

## 2018-01-31 DIAGNOSIS — R351 Nocturia: Secondary | ICD-10-CM

## 2018-01-31 DIAGNOSIS — G4719 Other hypersomnia: Secondary | ICD-10-CM

## 2018-01-31 DIAGNOSIS — R0683 Snoring: Secondary | ICD-10-CM | POA: Diagnosis not present
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
## 2018-01-31 NOTE — Patient Instructions (Signed)

## 2018-02-01 ENCOUNTER — Telehealth: Payer: Self-pay | Admitting: Nurse Practitioner

## 2018-02-01 ENCOUNTER — Encounter: Payer: Self-pay | Admitting: Nurse Practitioner

## 2018-02-01 LAB — VITAMIN D 1,25 DIHYDROXY
Vitamin D 1, 25 (OH)2 Total: 50 pg/mL (ref 18–72)
Vitamin D2 1, 25 (OH)2: <8 pg/mL
Vitamin D3 1, 25 (OH)2: 50 pg/mL

## 2018-02-01 MED ORDER — LOSARTAN POTASSIUM 100 MG PO TABS: 100.0000 mg | ORAL_TABLET | Freq: Every day | ORAL | 1 refills | Status: DC

## 2018-02-01 MED ORDER — VITAMIN B-12 1000 MCG PO TABS: 1000.0000 ug | ORAL_TABLET | Freq: Every day | ORAL | 1 refills | Status: DC

## 2018-02-01 MED ORDER — PRAVASTATIN SODIUM 40 MG PO TABS: ORAL_TABLET | ORAL | 1 refills | Status: DC

## 2018-02-01 MED ORDER — METOPROLOL TARTRATE 100 MG PO TABS: 100.0000 mg | ORAL_TABLET | Freq: Two times a day (BID) | ORAL | 1 refills | Status: DC

## 2018-02-01 NOTE — Telephone Encounter (Signed)
Copied from [place] [id number]. Topic: Quick Communication - See Telephone Encounter >> Feb 01, 2018  9:25 AM Bea Graff, NT wrote: CRM for notification. See Telephone encounter for: 02/01/18. Pt returning call to get lab results.

## 2018-02-01 NOTE — Telephone Encounter (Signed)
Patient notified

## 2018-02-06 ENCOUNTER — Ambulatory Visit (INDEPENDENT_AMBULATORY_CARE_PROVIDER_SITE_OTHER): Payer: Medicare HMO | Admitting: Psychiatry

## 2018-02-06 DIAGNOSIS — F4323 Adjustment disorder with mixed anxiety and depressed mood: Secondary | ICD-10-CM | POA: Diagnosis not present

## 2018-02-11 ENCOUNTER — Ambulatory Visit (INDEPENDENT_AMBULATORY_CARE_PROVIDER_SITE_OTHER): Payer: Medicare HMO | Admitting: Neurology

## 2018-02-11 DIAGNOSIS — R519 Headache, unspecified: Secondary | ICD-10-CM

## 2018-02-11 DIAGNOSIS — G4719 Other hypersomnia: Secondary | ICD-10-CM

## 2018-02-11 DIAGNOSIS — R0683 Snoring: Secondary | ICD-10-CM

## 2018-02-11 DIAGNOSIS — G4733 Obstructive sleep apnea (adult) (pediatric): Secondary | ICD-10-CM

## 2018-02-11 DIAGNOSIS — R609 Edema, unspecified: Secondary | ICD-10-CM

## 2018-02-11 DIAGNOSIS — G4720 Circadian rhythm sleep disorder, unspecified type: Secondary | ICD-10-CM

## 2018-02-11 DIAGNOSIS — R51 Headache: Secondary | ICD-10-CM

## 2018-02-11 DIAGNOSIS — R351 Nocturia: Secondary | ICD-10-CM

## 2018-02-11 DIAGNOSIS — E669 Obesity, unspecified: Secondary | ICD-10-CM

## 2018-02-11 DIAGNOSIS — Z820 Family history of epilepsy and other diseases of the nervous system: Secondary | ICD-10-CM

## 2018-02-14 ENCOUNTER — Telehealth: Payer: Self-pay

## 2018-02-14 DIAGNOSIS — M4802 Spinal stenosis, cervical region: Secondary | ICD-10-CM | POA: Diagnosis not present

## 2018-02-14 DIAGNOSIS — M545 Low back pain: Secondary | ICD-10-CM | POA: Diagnosis not present

## 2018-02-14 DIAGNOSIS — M48062 Spinal stenosis, lumbar region with neurogenic claudication: Secondary | ICD-10-CM | POA: Diagnosis not present

## 2018-02-14 NOTE — Procedures (Signed)
PATIENT'S NAME:  [name], Blowers DOB:      [birth year]      MR#:    [id number]     DATE OF RECORDING: 02/11/2018 REFERRING M.D.:  Flossie Buffy, NP Study Performed:   Baseline Polysomnogram HISTORY: 67 year old woman with a history of hypertension, reflux disease, diverticulosis, depression, anxiety, asthma, coronary artery disease, breast cancer with status post lumpectomy, chemotherapy therapy and radiation, history of panic attack, spinal stenosis with lumbar radiculopathy, neck pain for which she saw Dr. Leta Baptist in 2018, irritable bowel syndrome, history of TIA, neuropathy, and borderline obesity, who reports snoring and excessive daytime somnolence. The patient endorsed the Epworth Sleepiness Scale at 15/24 points. The patient's weight 173 pounds with a height of 63 (inches), resulting in a BMI of 30.5 kg/m2.  The patient's neck circumference measured 16 inches.  CURRENT MEDICATIONS: Tylenol, Xanax, Vitamin C, Aspirin 81, Biotin PO, Vitamin D, Zyrtec, Flovent HFA , Flonase, Advair Diskus, Anusol, Atrovent, Cozaar, Slow Mag, Lopressor, Nitrostat, Nystatin, Protonix, Potassium Chloride, Pravachol, Restasis, Vitamin E,   PROCEDURE:  This is a multichannel digital polysomnogram utilizing the Somnostar 11.2 system.  Electrodes and sensors were applied and monitored per AASM Specifications.   EEG, EOG, Chin and Limb EMG, were sampled at 200 Hz.  ECG, Snore and Nasal Pressure, Thermal Airflow, Respiratory Effort, CPAP Flow and Pressure, Oximetry was sampled at 50 Hz. Digital video and audio were recorded.      BASELINE STUDY  Lights Out was at 21:23 and Lights On at 04:57.  Total recording time (TRT) was 454 minutes, with a total sleep time (TST) of  378.5 minutes.   The patient's sleep latency was 34.5 minutes. REM latency was 110.5 minutes, which is borderline delayed. The sleep efficiency was 83.4 %.     SLEEP ARCHITECTURE: WASO (Wake after sleep onset) was 68 minutes with mild sleep  fragmentation noted. There were 8 minutes in Stage N1, 300.5 minutes Stage N2, 0 minutes Stage N3 and 70 minutes in Stage REM.  The percentage of Stage N1 was 2.1%, Stage N2 was 79.4%, which is markedly increased, Stage N3 was absent, and Stage R (REM sleep) was 18.5%, which is near normal. The arousals were noted as: 20 were spontaneous, 0 were associated with PLMs, 26 were associated with respiratory events.  RESPIRATORY ANALYSIS:  There were a total of 36 respiratory events:  18 obstructive apneas, 0 central apneas and 0 mixed apneas with a total of 18 apneas and an apnea index (AI) of 2.9 /hour. There were 18 hypopneas with a hypopnea index of 2.9 /hour. The patient also had 17 respiratory event related arousals (RERAs).      The total APNEA/HYPOPNEA INDEX (AHI) was 5.7/hour and the total RESPIRATORY DISTURBANCE INDEX was 0. 8.4 /hour.  18 events occurred in REM sleep and 18 events in NREM. The REM AHI was 15.4 /hour, versus a non-REM AHI of 3.5. The patient spent 378.5 minutes of total sleep time in the supine position and 0 minutes in non-supine.. The supine AHI was 5.8 versus a non-supine AHI of 0.0.  OXYGEN SATURATION & C02:  The Wake baseline 02 saturation was 92%, with the lowest being 81%. Time spent below 89% saturation equaled 8 minutes.  PERIODIC LIMB MOVEMENTS: The patient had a total of 0 Periodic Limb Movements.  The Periodic Limb Movement (PLM) index was 0 and the PLM Arousal index was 0/hour.  Audio and video analysis did not show any abnormal or unusual movements, behaviors, phonations or vocalizations.  The patient took 1 bathroom break. Moderate to loud snoring was noted. The EKG was in keeping with normal sinus rhythm (NSR).   Post-study, the patient indicated that sleep was better than usual.   IMPRESSION: 1. Obstructive Sleep Apnea(OSA) 2. Dysfunctions associated with sleep stages or arousal from sleep  RECOMMENDATIONS: 1. This study demonstrates mild to moderate  obstructive sleep apnea, with a total AHI of 5.7/hour, REM AHI of 15.4/hour, and O2 nadir of 81%. Given her medical history and sleep related complaints, treatment of her mild OSA with positive airway pressure can be considered in the form of AutoPAP at home. A CPAP titration would require a full night titration study to optimize therapy, if needed. Other treatment options may include avoidance of supine sleep position along with weight loss, upper airway or jaw surgery in selected patients or the use of an oral appliance in certain patients. ENT evaluation and/or consultation with a maxillofacial surgeon or dentist may be feasible in some instances.    2. Please note that untreated obstructive sleep apnea may carry additional perioperative morbidity. Patients with significant obstructive sleep apnea should receive perioperative PAP therapy and the surgeons and particularly the anesthesiologist should be informed of the diagnosis and the severity of the sleep disordered breathing. 3. This study shows sleep fragmentation and abnormal sleep stage percentages; these are nonspecific findings and per se do not signify an intrinsic sleep disorder or a cause for the patient's sleep-related symptoms. Causes include (but are not limited to) the first night effect of the sleep study, circadian rhythm disturbances, medication effect or an underlying mood disorder or medical problem.  4. The patient should be cautioned not to drive, work at heights, or operate dangerous or heavy equipment when tired or sleepy. Review and reiteration of good sleep hygiene measures should be pursued with any patient. 5. The patient will be seen in follow-up in the sleep clinic at [facility] for discussion of the test results, symptom and treatment compliance review, further management strategies, etc. The referring provider will be notified of the test results.  I certify that I have reviewed the entire raw data recording prior to the issuance of  this report in accordance with the Standards of Accreditation of the American Academy of Sleep Medicine (AASM)   Star Age, MD, PhD Diplomat, American Board of Psychiatry and Neurology (Neurology and Sleep Medicine)

## 2018-02-14 NOTE — Telephone Encounter (Signed)
I called pt. I advised pt that Dr. Rexene Alberts reviewed their sleep study results and found that pt does have mild osa. Dr. Rexene Alberts recommends that pt start an auto pap at home. I reviewed PAP compliance expectations with the pt. Pt is agreeable to starting an auto-PAP. I advised pt that an order will be sent to a DME, [facility], and [employer] will call the pt within about one week after they file with the pt's insurance. [employer] will show the pt how to use the machine, fit for masks, and troubleshoot the auto-PAP if needed. A follow up appt was made for insurance purposes with Dr. Rexene Alberts on 05/15/18 at 11:30am. Pt verbalized understanding to arrive 15 minutes early and bring their auto-PAP. A letter with all of this information in it will be mailed to the pt as a reminder. I verified with the pt that the address we have on file is correct. Pt verbalized understanding of results. Pt had no questions at this time but was encouraged to call back if questions arise.

## 2018-02-14 NOTE — Addendum Note (Signed)
Addended by: Star Age on: 02/14/2018 11:40 AM   Modules accepted: Orders

## 2018-02-14 NOTE — Telephone Encounter (Signed)
-----   Message from Star Age, MD sent at 02/14/2018 11:40 AM EDT ----- Patient referred by NP, Ms. Nche, seen by me on 01/31/18, diagnostic PSG on 02/11/18.    Please call and notify the patient that the recent sleep study did confirm the diagnosis of obstructive sleep apnea. OSA is overall mild, but worth treating to see if she feels better after treatment. To that end I recommend treatment for this in the form of autoPAP, which means, that we don't have to bring her back for a second sleep study with CPAP, but will let him try an autoPAP machine at home, through a DME company (of her choice, or as per insurance requirement). The DME representative will educate her on how to use the machine, how to put the mask on, etc. I have placed an order in the chart. Please send referral, talk to patient, send report to referring MD. We will need a FU in sleep clinic for 10 weeks post-PAP set up, please arrange that with me or one of our NPs. Thanks,   Star Age, MD, PhD [facility])

## 2018-02-14 NOTE — Progress Notes (Signed)
Patient referred by NP, Ms. Nche, seen by me on 01/31/18, diagnostic PSG on 02/11/18.    Please call and notify the patient that the recent sleep study did confirm the diagnosis of obstructive sleep apnea. OSA is overall mild, but worth treating to see if she feels better after treatment. To that end I recommend treatment for this in the form of autoPAP, which means, that we don't have to bring her back for a second sleep study with CPAP, but will let him try an autoPAP machine at home, through a DME company (of her choice, or as per insurance requirement). The DME representative will educate her on how to use the machine, how to put the mask on, etc. I have placed an order in the chart. Please send referral, talk to patient, send report to referring MD. We will need a FU in sleep clinic for 10 weeks post-PAP set up, please arrange that with me or one of our NPs. Thanks,   Star Age, MD, PhD [facility])

## 2018-02-19 ENCOUNTER — Encounter: Payer: Self-pay | Admitting: Family Medicine

## 2018-02-19 ENCOUNTER — Ambulatory Visit: Payer: Self-pay

## 2018-02-19 ENCOUNTER — Ambulatory Visit (INDEPENDENT_AMBULATORY_CARE_PROVIDER_SITE_OTHER): Payer: Medicare HMO | Admitting: Family Medicine

## 2018-02-19 VITALS — BP 142/78 | HR 62 | Ht 63.0 in | Wt 172.0 lb

## 2018-02-19 DIAGNOSIS — J069 Acute upper respiratory infection, unspecified: Secondary | ICD-10-CM | POA: Diagnosis not present

## 2018-02-19 DIAGNOSIS — I10 Essential (primary) hypertension: Secondary | ICD-10-CM | POA: Diagnosis not present

## 2018-02-19 NOTE — Assessment & Plan Note (Signed)
Symptoms seem consistent with a viral origin and eustachian tube dysfunction -Counseled on supportive care -Given indications to follow-up.
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
## 2018-02-19 NOTE — Patient Instructions (Signed)
Nice to see you  Please try flonase for your ear pain  Please continue to monitor your blood pressure. You should see improvement with the CPAP Please follow up with me if you notice your blood pressure to continue to be elevated.

## 2018-02-19 NOTE — Assessment & Plan Note (Signed)
Mildly uncontrolled today. Will receive her CPAP this week  - continue to monitor and with current medications  - has a follow up scheduled. Given indications to follow up or seek more immediate care

## 2018-02-19 NOTE — Telephone Encounter (Signed)
Pt. Reports she has had elevated BP readings over the weekend. 161/86 - 228/100. Yesterday it was 228/100. Does have a headache on and off. No missed doses of BP medication. Appointment made for today.  Reason for Disposition . Systolic BP  >= 384 OR Diastolic >= 665  Answer Assessment - Initial Assessment Questions 1. BLOOD PRESSURE: "What is the blood pressure?" "Did you take at least two measurements 5 minutes apart?"     Yesterday 228/100 2. ONSET: "When did you take your blood pressure?"     Over the weekend 3. HOW: "How did you obtain the blood pressure?" (e.g., visiting nurse, automatic home BP monitor)     Home BP monitor 4. HISTORY: "Do you have a history of high blood pressure?"     Yes  5. MEDICATIONS: "Are you taking any medications for blood pressure?" "Have you missed any doses recently?"     No missed doses 6. OTHER SYMPTOMS: "Do you have any symptoms?" (e.g., headache, chest pain, blurred vision, difficulty breathing, weakness)     Headache 7. PREGNANCY: "Is there any chance you are pregnant?" "When was your last menstrual period?"     No  Protocols used: HIGH BLOOD PRESSURE-A-AH

## 2018-02-19 NOTE — Telephone Encounter (Signed)
fyi

## 2018-02-21 ENCOUNTER — Emergency Department (HOSPITAL_COMMUNITY)
Admission: EM | Admit: 2018-02-21 | Discharge: 2018-02-22 | Disposition: A | Discharge status: Home / Self Care | Payer: Medicare HMO | Attending: Emergency Medicine | Admitting: Emergency Medicine

## 2018-02-21 ENCOUNTER — Encounter (HOSPITAL_COMMUNITY): Payer: Self-pay | Admitting: Emergency Medicine

## 2018-02-21 DIAGNOSIS — R300 Dysuria: Secondary | ICD-10-CM | POA: Insufficient documentation

## 2018-02-21 DIAGNOSIS — R51 Headache: Secondary | ICD-10-CM | POA: Diagnosis not present

## 2018-02-21 DIAGNOSIS — Z5321 Procedure and treatment not carried out due to patient leaving prior to being seen by health care provider: Secondary | ICD-10-CM | POA: Insufficient documentation

## 2018-02-21 LAB — URINALYSIS, ROUTINE W REFLEX MICROSCOPIC
BILIRUBIN URINE: NEGATIVE
GLUCOSE, UA: NEGATIVE mg/dL
KETONES UR: NEGATIVE mg/dL
NITRITE: NEGATIVE
PH: 6 (ref 5.0–8.0)
Protein, ur: NEGATIVE mg/dL
SPECIFIC GRAVITY, URINE: 1.004 — AB (ref 1.005–1.030)
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
## 2018-02-21 NOTE — ED Notes (Signed)
Pt gave stickers to registration and stated she would not be staying.

## 2018-02-22 ENCOUNTER — Ambulatory Visit (INDEPENDENT_AMBULATORY_CARE_PROVIDER_SITE_OTHER): Payer: Medicare HMO | Admitting: Psychiatry

## 2018-02-22 ENCOUNTER — Ambulatory Visit: Payer: Self-pay | Admitting: *Deleted

## 2018-02-22 DIAGNOSIS — F4323 Adjustment disorder with mixed anxiety and depressed mood: Secondary | ICD-10-CM

## 2018-02-22 NOTE — Telephone Encounter (Signed)
FYI
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
## 2018-02-23 ENCOUNTER — Encounter: Payer: Self-pay | Admitting: Family Medicine

## 2018-02-23 ENCOUNTER — Ambulatory Visit (INDEPENDENT_AMBULATORY_CARE_PROVIDER_SITE_OTHER): Payer: Medicare HMO | Admitting: Family Medicine

## 2018-02-23 VITALS — BP 146/82 | HR 76 | Temp 98.0°F | Ht 63.0 in | Wt 174.8 lb

## 2018-02-23 DIAGNOSIS — I10 Essential (primary) hypertension: Secondary | ICD-10-CM | POA: Diagnosis not present

## 2018-02-23 DIAGNOSIS — R350 Frequency of micturition: Secondary | ICD-10-CM | POA: Diagnosis not present

## 2018-02-23 DIAGNOSIS — N3090 Cystitis, unspecified without hematuria: Secondary | ICD-10-CM | POA: Diagnosis not present

## 2018-02-23 DIAGNOSIS — R300 Dysuria: Secondary | ICD-10-CM

## 2018-02-23 LAB — POCT URINALYSIS DIPSTICK
BILIRUBIN UA: NEGATIVE
Glucose, UA: NEGATIVE
Ketones, UA: NEGATIVE
NITRITE UA: NEGATIVE
PH UA: 6 (ref 5.0–8.0)
Protein, UA: NEGATIVE
Spec Grav, UA: 1.01 (ref 1.010–1.025)
UROBILINOGEN UA: 0.2 U/dL

## 2018-02-23 MED ORDER — NITROFURANTOIN MONOHYD MACRO 100 MG PO CAPS: 100.0000 mg | ORAL_CAPSULE | Freq: Two times a day (BID) | ORAL | 0 refills | Status: AC

## 2018-02-23 NOTE — Patient Instructions (Signed)
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
## 2018-02-23 NOTE — Assessment & Plan Note (Addendum)
BP is fairly well controlled and elevations may be due to stress related to caring for her ill mother, however paroxysmal elevations in her BP as well as family history of adrenal tumor are concerning for Pheochromocytoma She will continue current medications.  24 hour metanephrines/catecholamines ordered Recent TSH and Renin/aldosterone labs reviewed.

## 2018-02-23 NOTE — Assessment & Plan Note (Signed)
UA and symptoms consistent with UTI Has multiple allergies, will treat with macrobid. Urine sent for culture, will adjust antibiotics if needed based on results.

## 2018-02-25 ENCOUNTER — Emergency Department (HOSPITAL_COMMUNITY)
Admission: EM | Admit: 2018-02-25 | Discharge: 2018-02-25 | Disposition: A | Discharge status: Home / Self Care | Payer: Medicare HMO | Attending: Emergency Medicine | Admitting: Emergency Medicine

## 2018-02-25 ENCOUNTER — Encounter (HOSPITAL_COMMUNITY): Payer: Self-pay | Admitting: Nurse Practitioner

## 2018-02-25 DIAGNOSIS — I10 Essential (primary) hypertension: Secondary | ICD-10-CM

## 2018-02-25 DIAGNOSIS — Z7982 Long term (current) use of aspirin: Secondary | ICD-10-CM | POA: Insufficient documentation

## 2018-02-25 DIAGNOSIS — I2510 Atherosclerotic heart disease of native coronary artery without angina pectoris: Secondary | ICD-10-CM | POA: Diagnosis not present

## 2018-02-25 DIAGNOSIS — I5032 Chronic diastolic (congestive) heart failure: Secondary | ICD-10-CM | POA: Insufficient documentation

## 2018-02-25 DIAGNOSIS — I110 Hypertensive heart disease with heart failure: Secondary | ICD-10-CM | POA: Insufficient documentation

## 2018-02-25 DIAGNOSIS — Z87891 Personal history of nicotine dependence: Secondary | ICD-10-CM | POA: Diagnosis not present

## 2018-02-25 DIAGNOSIS — J45909 Unspecified asthma, uncomplicated: Secondary | ICD-10-CM | POA: Insufficient documentation

## 2018-02-25 DIAGNOSIS — Z79899 Other long term (current) drug therapy: Secondary | ICD-10-CM | POA: Diagnosis not present

## 2018-02-25 DIAGNOSIS — Z91040 Latex allergy status: Secondary | ICD-10-CM | POA: Insufficient documentation

## 2018-02-25 LAB — BASIC METABOLIC PANEL
Anion gap: 5 (ref 5–15)
BUN: 19 mg/dL (ref 6–20)
CHLORIDE: 109 mmol/L (ref 101–111)
CO2: 29 mmol/L (ref 22–32)
Calcium: 9.8 mg/dL (ref 8.9–10.3)
Creatinine, Ser: 0.99 mg/dL (ref 0.44–1.00)
GFR calc Af Amer: >60 mL/min (ref >60)
GFR calc non Af Amer: 59 mL/min — ABNORMAL LOW (ref >60)
Glucose, Bld: 147 mg/dL — ABNORMAL HIGH (ref 65–99)
POTASSIUM: 3.7 mmol/L (ref 3.5–5.1)
SODIUM: 143 mmol/L (ref 135–145)

## 2018-02-25 LAB — URINE CULTURE
MICRO NUMBER:: 90716100
SPECIMEN QUALITY:: ADEQUATE

## 2018-02-25 LAB — CBC
HEMATOCRIT: 37.8 % (ref 36.0–46.0)
Hemoglobin: 12.3 g/dL (ref 12.0–15.0)
MCH: 29.9 pg (ref 26.0–34.0)
MCHC: 32.5 g/dL (ref 30.0–36.0)
MCV: 91.7 fL (ref 78.0–100.0)
Platelets: 176 10*3/uL (ref 150–400)
RBC: 4.12 MIL/uL (ref 3.87–5.11)
RDW: 13.6 % (ref 11.5–15.5)
WBC: 4.2 10*3/uL (ref 4.0–10.5)

## 2018-02-25 NOTE — Discharge Instructions (Addendum)
Follow through with evaluation and treatment plans, by your PCP provider.  Return here, if needed, for problems.

## 2018-02-25 NOTE — ED Notes (Signed)
Pt ambulated very well O2 98%  B/P   178/92
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
## 2018-02-25 NOTE — ED Triage Notes (Signed)
Pt is c/o ongoing BP problems and now noticing bilateral leg swelling.

## 2018-02-26 ENCOUNTER — Telehealth: Payer: Self-pay | Admitting: Nurse Practitioner

## 2018-02-26 NOTE — Progress Notes (Signed)
Current antibiotic should clear up UTI.  Take until all gone.

## 2018-02-26 NOTE — Telephone Encounter (Signed)
Charted in error.

## 2018-02-26 NOTE — Telephone Encounter (Signed)
Pt returned phone call regarding lab results left on VM  Results not In [place] [id number] reviewed allowing pec to give results  Result note left by Dr Luetta Nutting on 02/26/2018  Read to patient  Pt also reports she fell over the weekend the weekend and sustained minor abrasions she states she cleaned the wounds and declined an office office

## 2018-02-27 ENCOUNTER — Other Ambulatory Visit: Payer: Medicare HMO

## 2018-02-27 DIAGNOSIS — G4733 Obstructive sleep apnea (adult) (pediatric): Secondary | ICD-10-CM | POA: Diagnosis not present

## 2018-02-27 DIAGNOSIS — I10 Essential (primary) hypertension: Secondary | ICD-10-CM | POA: Diagnosis not present

## 2018-02-27 NOTE — Progress Notes (Signed)
Specimen drop off

## 2018-03-01 ENCOUNTER — Encounter: Payer: Self-pay | Admitting: Physical Therapy

## 2018-03-01 ENCOUNTER — Ambulatory Visit: Payer: Medicare HMO | Attending: Nurse Practitioner | Admitting: Physical Therapy

## 2018-03-01 DIAGNOSIS — R2689 Other abnormalities of gait and mobility: Secondary | ICD-10-CM

## 2018-03-01 DIAGNOSIS — M6281 Muscle weakness (generalized): Secondary | ICD-10-CM

## 2018-03-01 DIAGNOSIS — R2681 Unsteadiness on feet: Secondary | ICD-10-CM

## 2018-03-01 LAB — METANEPHRINES, PHEOCHROMOCYT
Creatinine, Random U: 134.8 mg/dL
METANEPH TOTAL UR: 136 ug/L
METANEPH/CREAT RATIO: 0.3 ug/mg{creat} (ref 0.0–1.0)
Normetanephrine, Ur: 231 ug/L

## 2018-03-02 NOTE — Telephone Encounter (Signed)
Error opening  
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
## 2018-03-03 LAB — CATECHOLAMINES, FRACTIONATED, URINE, 24 HOUR
CREATININE, URINE MG/DAY-CATEUR: 0.98 g/(24.h) (ref 0.50–2.15)
Calculated Total (E+NE): 24 mcg/24 h — ABNORMAL LOW (ref 26–121)
Dopamine, 24 hr Urine: 189 mcg/24 h (ref 52–480)
NOREPINEPHRINE, 24 HR UR: 24 ug/(24.h) (ref 15–100)
VOLUME, URINE-VMAUR: 1000 mL

## 2018-03-03 LAB — METANEPHRINES, URINE, 24 HOUR
METANEPHRINES UR: 104 ug/(24.h) (ref 90–315)
Metaneph Total, Ur: 271 mcg/24 h (ref 224–832)
Normetanephrine, 24H Ur: 167 mcg/24 h (ref 122–676)
Volume, Urine-VMAUR: 1000

## 2018-03-05 ENCOUNTER — Encounter: Payer: Self-pay | Admitting: Physical Therapy

## 2018-03-05 ENCOUNTER — Ambulatory Visit: Payer: Medicare HMO | Admitting: Physical Therapy

## 2018-03-05 VITALS — BP 162/83

## 2018-03-05 DIAGNOSIS — R2681 Unsteadiness on feet: Secondary | ICD-10-CM | POA: Diagnosis not present

## 2018-03-05 DIAGNOSIS — M6281 Muscle weakness (generalized): Secondary | ICD-10-CM | POA: Diagnosis not present

## 2018-03-05 DIAGNOSIS — R2689 Other abnormalities of gait and mobility: Secondary | ICD-10-CM | POA: Diagnosis not present
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
## 2018-03-05 NOTE — Patient Instructions (Addendum)
Avoid Twisting    Avoid twisting or bending back. Pivot around using foot movements, and bend at knees if needed when reaching for articles.  Weightshift from side to side or diagonally to reach around for bags and items.  You can also take a step to the side, then side back to register.  REMEMBER to USE your LEG MUSCLES, not your back!   Copyright  VHI. All rights reserved.  Work Positioning    Position self close to work, whether standing or sitting. Avoid straining forward at neck or waist.  Keep your feet wide so you can shift your weight side to side.   Copyright  VHI. All rights reserved.  Weight Shift: Lateral (Righting / Equilibrium)    Gently rock your weight side to side, with your feet at least shoulder width apart. Copyright  VHI. All rights reserved.

## 2018-03-06 ENCOUNTER — Ambulatory Visit (INDEPENDENT_AMBULATORY_CARE_PROVIDER_SITE_OTHER): Payer: Medicare HMO | Admitting: Psychiatry

## 2018-03-06 DIAGNOSIS — F4323 Adjustment disorder with mixed anxiety and depressed mood: Secondary | ICD-10-CM

## 2018-03-06 DIAGNOSIS — M5416 Radiculopathy, lumbar region: Secondary | ICD-10-CM | POA: Diagnosis not present

## 2018-03-06 DIAGNOSIS — M5126 Other intervertebral disc displacement, lumbar region: Secondary | ICD-10-CM | POA: Diagnosis not present

## 2018-03-06 DIAGNOSIS — Z6830 Body mass index (BMI) 30.0-30.9, adult: Secondary | ICD-10-CM | POA: Diagnosis not present

## 2018-03-06 DIAGNOSIS — M48062 Spinal stenosis, lumbar region with neurogenic claudication: Secondary | ICD-10-CM | POA: Diagnosis not present

## 2018-03-07 ENCOUNTER — Ambulatory Visit: Payer: Medicare HMO | Admitting: Physical Therapy

## 2018-03-07 ENCOUNTER — Encounter: Payer: Self-pay | Admitting: Physical Therapy

## 2018-03-07 DIAGNOSIS — R2681 Unsteadiness on feet: Secondary | ICD-10-CM

## 2018-03-07 DIAGNOSIS — M6281 Muscle weakness (generalized): Secondary | ICD-10-CM

## 2018-03-07 DIAGNOSIS — R2689 Other abnormalities of gait and mobility: Secondary | ICD-10-CM | POA: Diagnosis not present
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
## 2018-03-07 NOTE — Patient Instructions (Addendum)
HIP: Hamstrings - Short Sitting    Rest leg on raised surface or on the floor. Keep knee straight and sit tall.  Lean forward until you feel a gentle stretch.  Hold _30__ seconds. _3__ reps per set, _1-2__ sets per day.  Copyright  VHI. All rights reserved.  Gastrocnemius    Support with hands on wall or counter, back straight. Place right foot back. Keep heels flat with knee straight. Do not raise up on toes. Do not sway or round back.  Lean forward until you feel a gentle stretch.  Hold __30__ seconds. Repeat _3___ times each leg. Do _1-2___ sessions per day. CAUTION: Stretch should be gentle, steady and slow.  Copyright  VHI. All rights reserved.

## 2018-03-08 ENCOUNTER — Other Ambulatory Visit: Payer: Self-pay | Admitting: Nurse Practitioner

## 2018-03-08 DIAGNOSIS — F411 Generalized anxiety disorder: Secondary | ICD-10-CM

## 2018-03-08 NOTE — Telephone Encounter (Signed)
Rx for Xanax 1 mg approve by Dr. Deborra Medina for 45 tab no refill called into [facility] outpatient pharmacy. Pt is aware.   Pt needs to keep an on 03/12/18 with [facility] to evaluate anxiety.

## 2018-03-08 NOTE — Telephone Encounter (Signed)
Copied from [place] [id number]. Topic: Quick Communication - See Telephone Encounter >> Mar 08, 2018  8:30 AM Mylinda Latina, NT wrote: CRM for notification. See Telephone encounter for: 03/08/18. Patient called and states she got a call form [employer] pharamcy stating her medication will be delayed. She is wondering if the provider will call her in 10 tablets of her ALPRAZolam (XANAX) 1 MG tablet to ...  [place], [place] [phone] (Phone) [phone] (Fax)

## 2018-03-12 ENCOUNTER — Ambulatory Visit (INDEPENDENT_AMBULATORY_CARE_PROVIDER_SITE_OTHER): Payer: Medicare HMO | Admitting: Nurse Practitioner

## 2018-03-12 ENCOUNTER — Encounter: Payer: Self-pay | Admitting: Nurse Practitioner

## 2018-03-12 VITALS — BP 164/86 | HR 81 | Temp 97.7°F | Ht 63.0 in | Wt 172.0 lb

## 2018-03-12 DIAGNOSIS — J322 Chronic ethmoidal sinusitis: Secondary | ICD-10-CM | POA: Diagnosis not present

## 2018-03-12 DIAGNOSIS — J00 Acute nasopharyngitis [common cold]: Secondary | ICD-10-CM | POA: Diagnosis not present

## 2018-03-12 DIAGNOSIS — I5032 Chronic diastolic (congestive) heart failure: Secondary | ICD-10-CM

## 2018-03-12 DIAGNOSIS — I10 Essential (primary) hypertension: Secondary | ICD-10-CM | POA: Diagnosis not present

## 2018-03-12 DIAGNOSIS — J309 Allergic rhinitis, unspecified: Secondary | ICD-10-CM | POA: Diagnosis not present

## 2018-03-12 DIAGNOSIS — F411 Generalized anxiety disorder: Secondary | ICD-10-CM

## 2018-03-12 MED ORDER — GUAIFENESIN-DM 100-10 MG/5ML PO SYRP: 5.0000 mL | ORAL_SOLUTION | ORAL | 0 refills | Status: DC | PRN

## 2018-03-12 MED ORDER — CETIRIZINE HCL 10 MG PO TABS: 10.0000 mg | ORAL_TABLET | Freq: Every day | ORAL | 0 refills | Status: DC

## 2018-03-12 MED ORDER — BENZONATATE 100 MG PO CAPS: 100.0000 mg | ORAL_CAPSULE | Freq: Three times a day (TID) | ORAL | 0 refills | Status: DC | PRN

## 2018-03-12 MED ORDER — FLUTICASONE PROPIONATE 50 MCG/ACT NA SUSP: 2.0000 | Freq: Every day | NASAL | 0 refills | Status: DC

## 2018-03-12 NOTE — Patient Instructions (Addendum)
You will be contacted to schedule appt with cardiology.  Alternate between flonase and atrovent, every 1week.  Maintain current dose of xanax, no refill needed at this time.  Use compression stocking during day and off at bedtime. Maintain DASH diet.   Contusion A contusion is a deep bruise. Contusions happen when an injury causes bleeding under the skin. Symptoms of bruising include pain, swelling, and discolored skin. The skin may turn blue, purple, or yellow. Follow these instructions at home:  Rest the injured area.  If told, put ice on the injured area. ? Put ice in a plastic bag. ? Place a towel between your skin and the bag. ? Leave the ice on for 20 minutes, 2-3 times per day.  If told, put light pressure (compression) on the injured area using an elastic bandage. Make sure the bandage is not too tight. Remove it and put it back on as told by your doctor.  If possible, raise (elevate) the injured area above the level of your heart while you are sitting or lying down.  Take over-the-counter and prescription medicines only as told by your doctor. Contact a doctor if:  Your symptoms do not get better after several days of treatment.  Your symptoms get worse.  You have trouble moving the injured area. Get help right away if:  You have very bad pain.  You have a loss of feeling (numbness) in a hand or foot.  Your hand or foot turns pale or cold. This information is not intended to replace advice given to you by your health care provider. Make sure you discuss any questions you have with your health care provider. Document Released: 02/15/2008 Document Revised: 02/04/2016 Document Reviewed: 01/14/2015 Elsevier Interactive Patient Education  2018 [place] DASH stands for "Dietary Approaches to Stop Hypertension." The DASH eating plan is a healthy eating plan that has been shown to reduce high blood pressure (hypertension). It may also reduce your  risk for type 2 diabetes, heart disease, and stroke. The DASH eating plan may also help with weight loss. What are tips for following this plan? General guidelines  Avoid eating more than 2,300 mg (milligrams) of salt (sodium) a day. If you have hypertension, you may need to reduce your sodium intake to 1,500 mg a day.  Limit alcohol intake to no more than 1 drink a day for nonpregnant women and 2 drinks a day for men. One drink equals 12 oz of beer, 5 oz of wine, or 1 oz of hard liquor.  Work with your health care provider to maintain a healthy body weight or to lose weight. Ask what an ideal weight is for you.  Get at least 30 minutes of exercise that causes your heart to beat faster (aerobic exercise) most days of the week. Activities may include walking, swimming, or biking.  Work with your health care provider or diet and nutrition specialist (dietitian) to adjust your eating plan to your individual calorie needs. Reading food labels  Check food labels for the amount of sodium per serving. Choose foods with less than 5 percent of the Daily Value of sodium. Generally, foods with less than 300 mg of sodium per serving fit into this eating plan.  To find whole grains, look for the word "whole" as the first word in the ingredient list. Shopping  Buy products labeled as "low-sodium" or "no salt added."  Buy fresh foods. Avoid canned foods and premade or frozen meals. Cooking  Avoid  adding salt when cooking. Use salt-free seasonings or herbs instead of table salt or sea salt. Check with your health care provider or pharmacist before using salt substitutes.  Do not fry foods. [place] foods using healthy methods such as baking, boiling, grilling, and broiling instead.  [place] with heart-healthy oils, such as olive, canola, soybean, or sunflower oil. Meal planning   Eat a balanced diet that includes: ? 5 or more servings of fruits and vegetables each day. At each meal, try to fill half of  your plate with fruits and vegetables. ? Up to 6-8 servings of whole grains each day. ? Less than 6 oz of lean meat, poultry, or fish each day. A 3-oz serving of meat is about the same size as a deck of cards. One egg equals 1 oz. ? 2 servings of low-fat dairy each day. ? A serving of nuts, seeds, or beans 5 times each week. ? Heart-healthy fats. Healthy fats called Omega-3 fatty acids are found in foods such as flaxseeds and coldwater fish, like sardines, salmon, and mackerel.  Limit how much you eat of the following: ? Canned or prepackaged foods. ? Food that is high in trans fat, such as fried foods. ? Food that is high in saturated fat, such as fatty meat. ? Sweets, desserts, sugary drinks, and other foods with added sugar. ? Full-fat dairy products.  Do not salt foods before eating.  Try to eat at least 2 vegetarian meals each week.  Eat more home-cooked food and less restaurant, buffet, and fast food.  When eating at a restaurant, ask that your food be prepared with less salt or no salt, if possible. What foods are recommended? The items listed may not be a complete list. Talk with your dietitian about what dietary choices are best for you. Grains Whole-grain or whole-wheat bread. Whole-grain or whole-wheat pasta. Brown rice. [place]. Bulgur. Whole-grain and low-sodium cereals. Pita bread. Low-fat, low-sodium crackers. Whole-wheat flour tortillas. Vegetables Fresh or frozen vegetables (raw, steamed, roasted, or grilled). Low-sodium or reduced-sodium tomato and vegetable juice. Low-sodium or reduced-sodium tomato sauce and tomato paste. Low-sodium or reduced-sodium canned vegetables. Fruits All fresh, dried, or frozen fruit. Canned fruit in natural juice (without added sugar). Meat and other protein foods Skinless chicken or [place]. Ground chicken or [place]. Pork with fat trimmed off. Fish and seafood. Egg whites. Dried beans, peas, or lentils. Unsalted nuts, nut butters,  and seeds. Unsalted canned beans. Lean cuts of beef with fat trimmed off. Low-sodium, lean deli meat. Dairy Low-fat (1%) or fat-free (skim) milk. Fat-free, low-fat, or reduced-fat cheeses. Nonfat, low-sodium ricotta or cottage cheese. Low-fat or nonfat yogurt. Low-fat, low-sodium cheese. Fats and oils Soft margarine without trans fats. Vegetable oil. Low-fat, reduced-fat, or light mayonnaise and salad dressings (reduced-sodium). Canola, safflower, olive, soybean, and sunflower oils. Avocado. Seasoning and other foods Herbs. Spices. Seasoning mixes without salt. Unsalted popcorn and pretzels. Fat-free sweets. What foods are not recommended? The items listed may not be a complete list. Talk with your dietitian about what dietary choices are best for you. Grains Baked goods made with fat, such as croissants, muffins, or some breads. Dry pasta or rice meal packs. Vegetables Creamed or fried vegetables. Vegetables in a cheese sauce. Regular canned vegetables (not low-sodium or reduced-sodium). Regular canned tomato sauce and paste (not low-sodium or reduced-sodium). Regular tomato and vegetable juice (not low-sodium or reduced-sodium). Angie Fava. Olives. Fruits Canned fruit in a light or heavy syrup. Fried fruit. Fruit in cream or butter sauce. Meat  and other protein foods Fatty cuts of meat. Ribs. Fried meat. Berniece Salines. Sausage. Bologna and other processed lunch meats. Salami. Fatback. Hotdogs. Bratwurst. Salted nuts and seeds. Canned beans with added salt. Canned or smoked fish. Whole eggs or egg yolks. Chicken or [place] with skin. Dairy Whole or 2% milk, cream, and half-and-half. Whole or full-fat cream cheese. Whole-fat or sweetened yogurt. Full-fat cheese. Nondairy creamers. Whipped toppings. Processed cheese and cheese spreads. Fats and oils Butter. Stick margarine. Lard. Shortening. Ghee. Bacon fat. Tropical oils, such as coconut, palm kernel, or palm oil. Seasoning and other foods Salted popcorn  and pretzels. Onion salt, garlic salt, seasoned salt, table salt, and sea salt. Worcestershire sauce. Tartar sauce. Barbecue sauce. Teriyaki sauce. Soy sauce, including reduced-sodium. Steak sauce. Canned and packaged gravies. Fish sauce. Oyster sauce. Cocktail sauce. Horseradish that you find on the shelf. Ketchup. Mustard. Meat flavorings and tenderizers. Bouillon cubes. Hot sauce and Tabasco sauce. Premade or packaged marinades. Premade or packaged taco seasonings. Relishes. Regular salad dressings. Where to find more information:  National Heart, Lung, and [place]: https://wilson-eaton.com/  American Heart Association: www.heart.org Summary  The DASH eating plan is a healthy eating plan that has been shown to reduce high blood pressure (hypertension). It may also reduce your risk for type 2 diabetes, heart disease, and stroke.  With the DASH eating plan, you should limit salt (sodium) intake to 2,300 mg a day. If you have hypertension, you may need to reduce your sodium intake to 1,500 mg a day.  When on the DASH eating plan, aim to eat more fresh fruits and vegetables, whole grains, lean proteins, low-fat dairy, and heart-healthy fats.  Work with your health care provider or diet and nutrition specialist (dietitian) to adjust your eating plan to your individual calorie needs. This information is not intended to replace advice given to you by your health care provider. Make sure you discuss any questions you have with your health care provider. Document Released: 08/18/2011 Document Revised: 08/22/2016 Document Reviewed: 08/22/2016 Elsevier Interactive Patient Education  [employer].

## 2018-03-12 NOTE — Progress Notes (Signed)
Testing negative for signs of adrenal tumor contributing to her elevated Bp.

## 2018-03-12 NOTE — Progress Notes (Signed)
Subjective:  Patient ID: [name], female    DOB: 11/08/1950  Age: 67 y.o. MRN: [id number]  CC: Follow-up (anxiety med consult/ throat drianage,ear pain/diarrhea--1 day--stop today/ bruises on right lower leg??)  URI   This is a new problem. The current episode started in the past 7 days. The problem has been unchanged. There has been no fever. Associated symptoms include congestion, coughing, ear pain, a plugged ear sensation, rhinorrhea, a sore throat and swollen glands. She has tried nothing for the symptoms.   HTN:  uncontrolled with losartan and metoprolol. Sleep study 02/2018 showed mild OSA, CPAP prescribed, she is to f/up with neurology No indication of adrenal tumor and/or renal stenosis Echocardiogram done 11/2015: moderate LVH, normal EF, grade 1 diastolic dysfunction. Multiple drug allergy, hence limited choice of BP medications No CP, no SOB, no HA, no dizziness today Persistent LE edema, some improvement with elevation. Does not use compression stocking BP Readings from Last 3 Encounters:  03/12/18 (!) 164/86  03/05/18 (!) 162/83  02/25/18 (!) 152/78   Right Leg Bruise: Hit leg against furniture at work. painful bruise to touch. No pain with ambulation. No loss of function No paresthesia.  Sore throat: Chronic, waxing and waning. Some improvement with atrovent.  Anxiety: Stable with xanax. She has decreased daytime dose to half tab and 1tab at bedtime. She is not interested in use of buspar, nor weaning off xanax at this time due to fear of withdrawal symptoms. Reports improved gait with PT.  Outpatient Medications Prior to Visit  Medication Sig Dispense Refill  . acetaminophen (TYLENOL) 500 MG tablet Take 500-1,000 mg by mouth every 6 (six) hours as needed for moderate pain.     Marland Kitchen ALPRAZolam (XANAX) 1 MG tablet TAKE 1/2 TABLET BY MOUTH IN THE MORNING AND 1 TABLET AT BEDTIME AS NEEDED FOR ANXIETY 45 tablet 0  . ascorbic acid (VITAMIN C) 1000 MG tablet  Take 1,000 mg by mouth daily.     Marland Kitchen aspirin EC 81 MG tablet Take 81 mg by mouth daily.    Marland Kitchen BIOTIN PO Take 1 tablet by mouth every morning. Hair Skin and Nails    . Calcium Carbonate-Vitamin D (CALCIUM-CARB 600 + D) 600-125 MG-UNIT TABS Take 1 tablet by mouth daily.     Marland Kitchen FLOVENT HFA 110 MCG/ACT inhaler Inhale 2 puffs into the lungs 2 (two) times daily.     . Fluticasone-Salmeterol (ADVAIR DISKUS) 100-50 MCG/DOSE AEPB Inhale 2 puffs into the lungs 2 (two) times daily.     . hydrocortisone (ANUSOL-HC) 25 MG suppository Place 1 suppository (25 mg total) rectally 2 (two) times daily. 12 suppository 0  . losartan (COZAAR) 100 MG tablet Take 1 tablet (100 mg total) by mouth daily. 90 tablet 1  . magnesium chloride (SLOW-MAG) 64 MG TBEC SR tablet Take 2 tablets (128 mg total) by mouth daily. 15 tablet 0  . Magnesium Oxide 400 (240 Mg) MG TABS Take by mouth.    . metoprolol tartrate (LOPRESSOR) 100 MG tablet Take 1 tablet (100 mg total) by mouth 2 (two) times daily. 180 tablet 1  . nitroGLYCERIN (NITROSTAT) 0.4 MG SL tablet Place 0.4 mg under the tongue every 5 (five) minutes as needed for chest pain.     Marland Kitchen nystatin (MYCOSTATIN) 100000 UNIT/ML suspension Take 5 mLs (500,000 Units total) by mouth 4 (four) times daily. 200 mL 0  . pantoprazole (PROTONIX) 40 MG tablet Take 1 tablet (40 mg total) by mouth daily. 90 tablet 3  .  potassium chloride (K-DUR) 10 MEQ tablet TAKE 2 TABLETS BY MOUTH EVERY OTHER DAY. CAN BREAK TABLETS IN HALF OR DISSOLVE IN WATER. 184 tablet 0  . pravastatin (PRAVACHOL) 40 MG tablet TAKE 1 TABLET BY MOUTH IN THE EVENING 90 tablet 1  . RESTASIS 0.05 % ophthalmic emulsion Place 1 drop into both eyes 2 (two) times daily.  3  . vitamin B-12 (CYANOCOBALAMIN) 1000 MCG tablet Take 1 tablet (1,000 mcg total) by mouth daily. 30 tablet 1  . vitamin E (VITAMIN E) 400 UNIT capsule Take 400 Units by mouth daily.    . cetirizine (ZYRTEC) 10 MG tablet Take 1 tablet (10 mg total) by mouth daily. 90  tablet 3  . fluticasone (FLONASE) 50 MCG/ACT nasal spray Place 1-2 sprays into both nostrils daily. (Patient taking differently: Place 1-2 sprays into both nostrils daily as needed for allergies or rhinitis. ) 16 g 3  . ipratropium (ATROVENT) 0.03 % nasal spray Place 2 sprays into both nostrils 2 (two) times daily. Do not use for more than 5days. 30 mL 1  . EPINEPHrine (EPIPEN 2-PAK) 0.3 mg/0.3 mL IJ SOAJ injection Inject into the muscle.     No facility-administered medications prior to visit.     ROS See HPI  Objective:  BP (!) 164/86   Pulse 81   Temp 97.7 F (36.5 C) (Oral)   Ht 5\' 3"  (1.6 m)   Wt 172 lb (78 kg)   SpO2 96%   BMI 30.47 kg/m   BP Readings from Last 3 Encounters:  03/12/18 (!) 164/86  03/05/18 (!) 162/83  02/25/18 (!) 152/78    Wt Readings from Last 3 Encounters:  03/12/18 172 lb (78 kg)  02/23/18 174 lb 12.8 oz (79.3 kg)  02/19/18 172 lb (78 kg)    Physical Exam  Constitutional: She is oriented to person, place, and time. No distress.  Neck: Normal range of motion. Neck supple.  Cardiovascular: Normal rate and regular rhythm.  Bilateral LE edema  Pulmonary/Chest: Effort normal and breath sounds normal.  Neurological: She is alert and oriented to person, place, and time.  Skin: Skin is warm and dry. Bruising noted. No rash noted.     Normal distal pulse, Bilateral ankle edema (mild pitting)  Psychiatric: She has a normal mood and affect. Her behavior is normal.  Vitals reviewed.   Lab Results  Component Value Date   WBC 4.2 02/25/2018   HGB 12.3 02/25/2018   HCT 37.8 02/25/2018   PLT 176 02/25/2018   GLUCOSE 147 (H) 02/25/2018   CHOL 145 01/30/2018   TRIG 189.0 (H) 01/30/2018   HDL 38.40 (L) 01/30/2018   LDLCALC 69 01/30/2018   ALT 10 01/30/2018   AST 11 01/30/2018   NA 143 02/25/2018   K 3.7 02/25/2018   CL 109 02/25/2018   CREATININE 0.99 02/25/2018   BUN 19 02/25/2018   CO2 29 02/25/2018   TSH 0.71 01/30/2018   INR 1.02  08/17/2013   HGBA1C 5.9 01/30/2018    Assessment & Plan:   [name] was seen today for follow-up.  Diagnoses and all orders for this visit:  Acute nasopharyngitis -     fluticasone (FLONASE) 50 MCG/ACT nasal spray; Place 2 sprays into both nostrils daily. -     guaiFENesin-dextromethorphan (ROBITUSSIN DM) 100-10 MG/5ML syrup; Take 5 mLs by mouth every 4 (four) hours as needed for cough. -     Cancel: Ambulatory referral to Cardiology -     benzonatate (TESSALON) 100 MG capsule;  Take 1 capsule (100 mg total) by mouth 3 (three) times daily as needed for cough. -     cetirizine (ZYRTEC) 10 MG tablet; Take 1 tablet (10 mg total) by mouth daily.  Chronic ethmoidal sinusitis -     fluticasone (FLONASE) 50 MCG/ACT nasal spray; Place 2 sprays into both nostrils daily. -     guaiFENesin-dextromethorphan (ROBITUSSIN DM) 100-10 MG/5ML syrup; Take 5 mLs by mouth every 4 (four) hours as needed for cough. -     Cancel: Ambulatory referral to Cardiology -     benzonatate (TESSALON) 100 MG capsule; Take 1 capsule (100 mg total) by mouth 3 (three) times daily as needed for cough. -     cetirizine (ZYRTEC) 10 MG tablet; Take 1 tablet (10 mg total) by mouth daily.  Essential hypertension -     Ambulatory referral to Cardiology  Allergic rhinitis, unspecified seasonality, unspecified trigger -     ipratropium (ATROVENT) 0.03 % nasal spray; Place 1 spray into both nostrils 2 (two) times daily.  Chronic diastolic heart failure (HCC)  Generalized anxiety disorder   I have discontinued [name]'s fluticasone. I have also changed her ipratropium. Additionally, I am having her start on fluticasone, guaiFENesin-dextromethorphan, and benzonatate. Lastly, I am having her maintain her Calcium Carbonate-Vitamin D, vitamin E, ascorbic acid, aspirin EC, acetaminophen, nitroGLYCERIN, BIOTIN PO, magnesium chloride, potassium chloride, pantoprazole, RESTASIS, FLOVENT HFA, Fluticasone-Salmeterol, nystatin,  hydrocortisone, vitamin B-12, losartan, metoprolol tartrate, pravastatin, Magnesium Oxide, ALPRAZolam, EPINEPHrine, and cetirizine.  Meds ordered this encounter  Medications  . fluticasone (FLONASE) 50 MCG/ACT nasal spray    Sig: Place 2 sprays into both nostrils daily.    Dispense:  16 g    Refill:  0    Order Specific Question:   Supervising Provider    Answer:   Lucille Passy [[id number]]  . guaiFENesin-dextromethorphan (ROBITUSSIN DM) 100-10 MG/5ML syrup    Sig: Take 5 mLs by mouth every 4 (four) hours as needed for cough.    Dispense:  118 mL    Refill:  0    Order Specific Question:   Supervising Provider    Answer:   Lucille Passy [[id number]]  . benzonatate (TESSALON) 100 MG capsule    Sig: Take 1 capsule (100 mg total) by mouth 3 (three) times daily as needed for cough.    Dispense:  20 capsule    Refill:  0    Order Specific Question:   Supervising Provider    Answer:   Lucille Passy [[id number]]  . cetirizine (ZYRTEC) 10 MG tablet    Sig: Take 1 tablet (10 mg total) by mouth daily.    Dispense:  14 tablet    Refill:  0    Order Specific Question:   Supervising Provider    Answer:   Lucille Passy [[id number]]  . ipratropium (ATROVENT) 0.03 % nasal spray    Sig: Place 1 spray into both nostrils 2 (two) times daily.    Dispense:  30 mL    Refill:  1    Order Specific Question:   Supervising Provider    Answer:   Lucille Passy [[id number]]   Follow-up: Return in about 3 months (around 06/12/2018) for HTN.  Wilfred Lacy, NP

## 2018-03-13 ENCOUNTER — Encounter: Payer: Self-pay | Admitting: Nurse Practitioner

## 2018-03-13 MED ORDER — IPRATROPIUM BROMIDE 0.03 % NA SOLN: 1.0000 | Freq: Two times a day (BID) | NASAL | 1 refills | Status: DC

## 2018-03-13 NOTE — Assessment & Plan Note (Addendum)
uncontrolled with losartan and metoprolol. Sleep study 02/2018 showed mild OSA, CPAP prescribed, she is to f/up with neurology No indication of adrenal tumor and/or renal stenosis Echocardiogram done 11/2015: moderate LVH, normal EF, grade 1 diastolic dysfunction. Multiple drug allergy, hence limited choice of BP medications No CP, no SOB, no HA, no dizziness today Persistent LE edema, some improvement with elevation BP Readings from Last 3 Encounters:  03/12/18 (!) 164/86  03/05/18 (!) 162/83  02/25/18 (!) 152/78   Entered referral to cardiology. Advised to use compression stocking for LE edema and maintain DASH diet

## 2018-03-14 ENCOUNTER — Encounter: Payer: Self-pay | Admitting: Physician Assistant

## 2018-03-14 ENCOUNTER — Ambulatory Visit (INDEPENDENT_AMBULATORY_CARE_PROVIDER_SITE_OTHER): Payer: Medicare HMO | Admitting: Physician Assistant

## 2018-03-14 ENCOUNTER — Encounter: Payer: Self-pay | Admitting: Cardiology

## 2018-03-14 VITALS — BP 164/78 | HR 72 | Ht 63.0 in | Wt 172.0 lb

## 2018-03-14 DIAGNOSIS — E876 Hypokalemia: Secondary | ICD-10-CM

## 2018-03-14 DIAGNOSIS — I10 Essential (primary) hypertension: Secondary | ICD-10-CM

## 2018-03-14 DIAGNOSIS — R079 Chest pain, unspecified: Secondary | ICD-10-CM

## 2018-03-14 DIAGNOSIS — Z79899 Other long term (current) drug therapy: Secondary | ICD-10-CM

## 2018-03-14 DIAGNOSIS — R002 Palpitations: Secondary | ICD-10-CM

## 2018-03-14 MED ORDER — POTASSIUM CHLORIDE ER 10 MEQ PO TBCR: 10.0000 meq | EXTENDED_RELEASE_TABLET | ORAL | 3 refills | Status: DC

## 2018-03-14 MED ORDER — SPIRONOLACTONE 25 MG PO TABS: 25.0000 mg | ORAL_TABLET | Freq: Every day | ORAL | 3 refills | Status: DC
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
## 2018-03-14 NOTE — Patient Instructions (Addendum)
Medication Instructions:  START spironolactone 25 mg daily DECREASE potassium to 10 meq every other day  Labwork: 1 week-BMET  Testing/Procedures: Your physician has requested that you have a lexiscan myoview. For further information please visit [email or web address]. Please follow instruction sheet, as given.  Your physician has recommended that you wear an event monitor (2 weeks). Event monitors are medical devices that record the heart's electrical activity. Doctors most often [place] these monitors to diagnose arrhythmias. Arrhythmias are problems with the speed or rhythm of the heartbeat. The monitor is a small, portable device. You can wear one while you do your normal daily activities. This is usually used to diagnose what is causing palpitations/syncope (passing out).  Follow-Up: 5-6 weeks with Dr. Percival Spanish  Any Other Special Instructions Will Be Listed Below (If Applicable).     If you need a refill on your cardiac medications before your next appointment, please call your pharmacy.

## 2018-03-16 ENCOUNTER — Telehealth (HOSPITAL_COMMUNITY): Payer: Self-pay

## 2018-03-16 NOTE — Telephone Encounter (Signed)
Encounter complete. 

## 2018-03-19 ENCOUNTER — Ambulatory Visit: Payer: Medicare HMO | Attending: Nurse Practitioner | Admitting: Physical Therapy

## 2018-03-19 DIAGNOSIS — R2689 Other abnormalities of gait and mobility: Secondary | ICD-10-CM | POA: Insufficient documentation

## 2018-03-19 DIAGNOSIS — R2681 Unsteadiness on feet: Secondary | ICD-10-CM | POA: Insufficient documentation

## 2018-03-19 DIAGNOSIS — M6281 Muscle weakness (generalized): Secondary | ICD-10-CM | POA: Insufficient documentation

## 2018-03-20 ENCOUNTER — Ambulatory Visit: Payer: Medicare HMO | Admitting: Physical Therapy

## 2018-03-20 VITALS — BP 157/89 | HR 74

## 2018-03-20 DIAGNOSIS — R2681 Unsteadiness on feet: Secondary | ICD-10-CM

## 2018-03-20 DIAGNOSIS — R2689 Other abnormalities of gait and mobility: Secondary | ICD-10-CM | POA: Diagnosis not present

## 2018-03-20 DIAGNOSIS — M6281 Muscle weakness (generalized): Secondary | ICD-10-CM
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
## 2018-03-21 ENCOUNTER — Ambulatory Visit (HOSPITAL_COMMUNITY)
Admission: RE | Admit: 2018-03-21 | Discharge: 2018-03-21 | Disposition: A | Discharge status: Home / Self Care | Payer: Medicare HMO | Source: Ambulatory Visit | Attending: Cardiology | Admitting: Cardiology

## 2018-03-21 ENCOUNTER — Ambulatory Visit: Payer: Self-pay | Admitting: Physical Therapy

## 2018-03-21 DIAGNOSIS — R079 Chest pain, unspecified: Secondary | ICD-10-CM | POA: Insufficient documentation

## 2018-03-21 LAB — MYOCARDIAL PERFUSION IMAGING
CHL CUP NUCLEAR SSS: 0
CHL CUP RESTING HR STRESS: 57 {beats}/min
LV dias vol: 80 mL (ref 46–106)
LVSYSVOL: 33 mL
NUC STRESS TID: 1.11
Peak HR: 95 {beats}/min
SDS: 0
SRS: 0

## 2018-03-21 MED ORDER — TECHNETIUM TC 99M TETROFOSMIN IV KIT
10.1000 | PACK | Freq: Once | INTRAVENOUS | Status: AC | PRN
  Administered 2018-03-21: 10.1 via INTRAVENOUS
  Filled 2018-03-21: qty 11

## 2018-03-21 MED ORDER — REGADENOSON 0.4 MG/5ML IV SOLN
0.4000 mg | Freq: Once | INTRAVENOUS | Status: AC
  Administered 2018-03-21: 0.4 mg via INTRAVENOUS

## 2018-03-21 MED ORDER — TECHNETIUM TC 99M TETROFOSMIN IV KIT
29.1000 | PACK | Freq: Once | INTRAVENOUS | Status: AC | PRN
  Administered 2018-03-21: 29.1 via INTRAVENOUS
  Filled 2018-03-21: qty 30

## 2018-03-22 ENCOUNTER — Ambulatory Visit: Payer: Medicare HMO | Admitting: Physical Therapy

## 2018-03-22 ENCOUNTER — Encounter: Payer: Self-pay | Admitting: Physical Therapy

## 2018-03-22 DIAGNOSIS — R2689 Other abnormalities of gait and mobility: Secondary | ICD-10-CM | POA: Diagnosis not present

## 2018-03-22 DIAGNOSIS — M6281 Muscle weakness (generalized): Secondary | ICD-10-CM

## 2018-03-22 DIAGNOSIS — R2681 Unsteadiness on feet: Secondary | ICD-10-CM

## 2018-03-22 NOTE — Patient Instructions (Signed)
Access Code: [email or web address]  URL: [email or web address]  Date: 03/22/2018  Prepared by: Mady Haagensen   Exercises  Sideways Walking - 3 reps - 1 sets - 1x daily - 5x weekly  Standing Tandem Balance with Counter Support - 3 reps - 1 sets - 10 sec hold - 1x daily - 5x weekly  Standing Single Leg Stance with Counter Support - 3 reps - 1 sets - 10 sec hold - 1x daily - 5x weekly  Heel Toe Raises with Counter Support - 10 reps - 2 sets - 1x daily - 5x weekly
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
## 2018-03-26 ENCOUNTER — Ambulatory Visit (INDEPENDENT_AMBULATORY_CARE_PROVIDER_SITE_OTHER): Payer: Medicare HMO | Admitting: Nurse Practitioner

## 2018-03-26 ENCOUNTER — Encounter: Payer: Self-pay | Admitting: Physical Therapy

## 2018-03-26 ENCOUNTER — Ambulatory Visit (INDEPENDENT_AMBULATORY_CARE_PROVIDER_SITE_OTHER): Payer: Medicare HMO

## 2018-03-26 ENCOUNTER — Encounter: Payer: Self-pay | Admitting: Nurse Practitioner

## 2018-03-26 ENCOUNTER — Ambulatory Visit: Payer: Medicare HMO | Admitting: Physical Therapy

## 2018-03-26 VITALS — BP 159/94 | HR 67

## 2018-03-26 VITALS — BP 144/82 | HR 66 | Temp 98.0°F | Ht 63.0 in | Wt 172.0 lb

## 2018-03-26 DIAGNOSIS — R2681 Unsteadiness on feet: Secondary | ICD-10-CM

## 2018-03-26 DIAGNOSIS — S8991XA Unspecified injury of right lower leg, initial encounter: Secondary | ICD-10-CM

## 2018-03-26 DIAGNOSIS — J0141 Acute recurrent pansinusitis: Secondary | ICD-10-CM | POA: Diagnosis not present

## 2018-03-26 DIAGNOSIS — Z79899 Other long term (current) drug therapy: Secondary | ICD-10-CM | POA: Diagnosis not present

## 2018-03-26 DIAGNOSIS — I10 Essential (primary) hypertension: Secondary | ICD-10-CM | POA: Diagnosis not present

## 2018-03-26 DIAGNOSIS — K2100 Gastro-esophageal reflux disease with esophagitis, without bleeding: Secondary | ICD-10-CM

## 2018-03-26 DIAGNOSIS — S8012XA Contusion of left lower leg, initial encounter: Secondary | ICD-10-CM

## 2018-03-26 DIAGNOSIS — M6281 Muscle weakness (generalized): Secondary | ICD-10-CM

## 2018-03-26 DIAGNOSIS — M7989 Other specified soft tissue disorders: Secondary | ICD-10-CM | POA: Diagnosis not present

## 2018-03-26 DIAGNOSIS — R2689 Other abnormalities of gait and mobility: Secondary | ICD-10-CM | POA: Diagnosis not present

## 2018-03-26 DIAGNOSIS — R002 Palpitations: Secondary | ICD-10-CM

## 2018-03-26 DIAGNOSIS — M79661 Pain in right lower leg: Secondary | ICD-10-CM | POA: Diagnosis not present

## 2018-03-26 MED ORDER — DOXYCYCLINE HYCLATE 100 MG PO TABS
100.0000 mg | ORAL_TABLET | Freq: Two times a day (BID) | ORAL | 0 refills | Status: AC
Start: 2018-03-26 — End: 2018-04-02

## 2018-03-26 MED ORDER — NYSTATIN 100000 UNIT/ML MT SUSP: 5.0000 mL | Freq: Four times a day (QID) | OROMUCOSAL | 0 refills | Status: DC

## 2018-03-26 NOTE — Progress Notes (Signed)
Subjective:  Patient ID: [name], female    DOB: Jan 21, 1951  Age: 67 y.o. MRN: [id number]  CC: Leg Swelling (knot on right lower leg,redness,going on for 1 mo) and Ear Pain (throat and ear pain going on for 2 wks. )   Leg Pain   The incident occurred more than 1 week ago. The incident occurred at home. The injury mechanism was a direct blow. The pain is present in the right leg. The quality of the pain is described as aching. The pain has been constant since onset. Pertinent negatives include no inability to bear weight, loss of sensation, numbness or tingling. She reports no foreign bodies present. The symptoms are aggravated by movement, palpation and weight bearing. She has tried elevation, ice and rest for the symptoms. The treatment provided no relief.  Sinus Problem  This is a new problem. The current episode started in the past 7 days. The problem has been waxing and waning since onset. There has been no fever. Associated symptoms include congestion, coughing, ear pain, headaches, a hoarse voice, sinus pressure, a sore throat and swollen glands. Pertinent negatives include no diaphoresis, neck pain or shortness of breath. Treatments tried: flonase, zyrtec, mucinex DM, atrovent. The treatment provided mild relief.  macrobid used 02/2018 for UTI. Doxycycline x 2days used 2months ago prior to dental procedure.  Hit right leg against furniture at work. No improvement in bruise x 2weeks. Worsening swelling and redness.   reviewed [facility] today  Outpatient Medications Prior to Visit  Medication Sig Dispense Refill  . acetaminophen (TYLENOL) 500 MG tablet Take 500-1,000 mg by mouth every 6 (six) hours as needed for moderate pain.     Marland Kitchen ALPRAZolam (XANAX) 1 MG tablet TAKE 1/2 TABLET BY MOUTH IN THE MORNING AND 1 TABLET AT BEDTIME AS NEEDED FOR ANXIETY 45 tablet 0  . ascorbic acid (VITAMIN C) 1000 MG tablet Take 1,000 mg by mouth daily.     Marland Kitchen aspirin EC 81 MG tablet Take 81 mg by mouth  daily.    . benzonatate (TESSALON) 100 MG capsule Take 1 capsule (100 mg total) by mouth 3 (three) times daily as needed for cough. 20 capsule 0  . BIOTIN PO Take 1 tablet by mouth every morning. Hair Skin and Nails    . Calcium Carbonate-Vitamin D (CALCIUM-CARB 600 + D) 600-125 MG-UNIT TABS Take 1 tablet by mouth daily.     . cetirizine (ZYRTEC) 10 MG tablet Take 1 tablet (10 mg total) by mouth daily. 14 tablet 0  . EPINEPHrine (EPIPEN 2-PAK) 0.3 mg/0.3 mL IJ SOAJ injection Inject into the muscle.    Marland Kitchen FLOVENT HFA 110 MCG/ACT inhaler Inhale 2 puffs into the lungs 2 (two) times daily.     . fluticasone (FLONASE) 50 MCG/ACT nasal spray Place 2 sprays into both nostrils daily. 16 g 0  . Fluticasone-Salmeterol (ADVAIR DISKUS) 100-50 MCG/DOSE AEPB Inhale 2 puffs into the lungs 2 (two) times daily.     Marland Kitchen guaiFENesin-dextromethorphan (ROBITUSSIN DM) 100-10 MG/5ML syrup Take 5 mLs by mouth every 4 (four) hours as needed for cough. 118 mL 0  . hydrocortisone (ANUSOL-HC) 25 MG suppository Place 1 suppository (25 mg total) rectally 2 (two) times daily. 12 suppository 0  . ipratropium (ATROVENT) 0.03 % nasal spray Place 1 spray into both nostrils 2 (two) times daily. 30 mL 1  . losartan (COZAAR) 100 MG tablet Take 1 tablet (100 mg total) by mouth daily. 90 tablet 1  . magnesium chloride (SLOW-MAG) 64  MG TBEC SR tablet Take 2 tablets (128 mg total) by mouth daily. 15 tablet 0  . Magnesium Oxide 400 (240 Mg) MG TABS Take by mouth.    . metoprolol tartrate (LOPRESSOR) 100 MG tablet Take 1 tablet (100 mg total) by mouth 2 (two) times daily. 180 tablet 1  . nitroGLYCERIN (NITROSTAT) 0.4 MG SL tablet Place 0.4 mg under the tongue every 5 (five) minutes as needed for chest pain.     . pantoprazole (PROTONIX) 40 MG tablet Take 1 tablet (40 mg total) by mouth daily. 90 tablet 3  . potassium chloride (K-DUR) 10 MEQ tablet Take 1 tablet (10 mEq total) by mouth every other day. 90 tablet 3  . pravastatin (PRAVACHOL) 40  MG tablet TAKE 1 TABLET BY MOUTH IN THE EVENING 90 tablet 1  . RESTASIS 0.05 % ophthalmic emulsion Place 1 drop into both eyes 2 (two) times daily.  3  . spironolactone (ALDACTONE) 25 MG tablet Take 1 tablet (25 mg total) by mouth daily. 90 tablet 3  . vitamin B-12 (CYANOCOBALAMIN) 1000 MCG tablet Take 1 tablet (1,000 mcg total) by mouth daily. 30 tablet 1  . vitamin E (VITAMIN E) 400 UNIT capsule Take 400 Units by mouth daily.    Marland Kitchen nystatin (MYCOSTATIN) 100000 UNIT/ML suspension Take 5 mLs (500,000 Units total) by mouth 4 (four) times daily. 200 mL 0   No facility-administered medications prior to visit.     ROS See HPI  Objective:  BP (!) 144/82   Pulse 66   Temp 98 F (36.7 C) (Oral)   Ht 5\' 3"  (1.6 m)   Wt 172 lb (78 kg)   SpO2 98%   BMI 30.47 kg/m   BP Readings from Last 3 Encounters:  03/26/18 (!) 144/82  03/26/18 (!) 159/94  03/20/18 (!) 157/89    Wt Readings from Last 3 Encounters:  03/26/18 172 lb (78 kg)  03/21/18 172 lb (78 kg)  03/14/18 172 lb (78 kg)    Physical Exam  Constitutional: She is oriented to person, place, and time. No distress.  HENT:  Right Ear: External ear and ear canal normal. No drainage. Tympanic membrane is not injected and not erythematous. A middle ear effusion is present.  Left Ear: External ear and ear canal normal. No drainage. Tympanic membrane is not injected and not erythematous. A middle ear effusion is present.  Nose: Mucosal edema and rhinorrhea present. Right sinus exhibits maxillary sinus tenderness. Right sinus exhibits no frontal sinus tenderness. Left sinus exhibits maxillary sinus tenderness. Left sinus exhibits no frontal sinus tenderness.  Mouth/Throat: Uvula is midline. Posterior oropharyngeal erythema present. No oropharyngeal exudate or posterior oropharyngeal edema.  Cardiovascular: Normal rate and regular rhythm.  Pulmonary/Chest: Effort normal and breath sounds normal. She has no wheezes.  Musculoskeletal: Normal  range of motion.       Right knee: Normal.       Right ankle: Normal.       Right lower leg: She exhibits tenderness, bony tenderness, swelling and edema. She exhibits no deformity and no laceration.       Legs:      Right foot: Normal.  Neurological: She is alert and oriented to person, place, and time.  Skin: Skin is warm and dry. There is erythema.  Vitals reviewed.   Lab Results  Component Value Date   WBC 4.2 02/25/2018   HGB 12.3 02/25/2018   HCT 37.8 02/25/2018   PLT 176 02/25/2018   GLUCOSE 147 (H) 02/25/2018  CHOL 145 01/30/2018   TRIG 189.0 (H) 01/30/2018   HDL 38.40 (L) 01/30/2018   LDLCALC 69 01/30/2018   ALT 10 01/30/2018   AST 11 01/30/2018   NA 143 02/25/2018   K 3.7 02/25/2018   CL 109 02/25/2018   CREATININE 0.99 02/25/2018   BUN 19 02/25/2018   CO2 29 02/25/2018   TSH 0.71 01/30/2018   INR 1.02 08/17/2013   HGBA1C 5.9 01/30/2018    No results found.  Assessment & Plan:   [place] was seen today for leg swelling and ear pain.  Diagnoses and all orders for this visit:  Acute recurrent pansinusitis -     doxycycline (VIBRA-TABS) 100 MG tablet; Take 1 tablet (100 mg total) by mouth 2 (two) times daily for 7 days.  Injury of right lower extremity, initial encounter -     DG Tibia/Fibula Right; Future -     DG Tibia/Fibula Right  Hematoma of leg, left, initial encounter -     DG Tibia/Fibula Right; Future -     DG Tibia/Fibula Right  Gastroesophageal reflux disease with esophagitis -     nystatin (MYCOSTATIN) 100000 UNIT/ML suspension; Take 5 mLs (500,000 Units total) by mouth 4 (four) times daily.   I am having Mitchel Honour start on doxycycline. I am also having her maintain her Calcium Carbonate-Vitamin D, vitamin E, ascorbic acid, aspirin EC, acetaminophen, nitroGLYCERIN, BIOTIN PO, magnesium chloride, pantoprazole, RESTASIS, FLOVENT HFA, Fluticasone-Salmeterol, hydrocortisone, vitamin B-12, losartan, metoprolol tartrate, pravastatin, Magnesium  Oxide, ALPRAZolam, EPINEPHrine, fluticasone, guaiFENesin-dextromethorphan, benzonatate, cetirizine, ipratropium, spironolactone, potassium chloride, and nystatin.  Meds ordered this encounter  Medications  . doxycycline (VIBRA-TABS) 100 MG tablet    Sig: Take 1 tablet (100 mg total) by mouth 2 (two) times daily for 7 days.    Dispense:  14 tablet    Refill:  0    Order Specific Question:   Supervising Provider    Answer:   MATTHEWS, CODY [[id number]]  . nystatin (MYCOSTATIN) 100000 UNIT/ML suspension    Sig: Take 5 mLs (500,000 Units total) by mouth 4 (four) times daily.    Dispense:  200 mL    Refill:  0    Order Specific Question:   Supervising Provider    Answer:   MATTHEWS, CODY [[id number]]    Follow-up: Return if symptoms worsen or fail to improve.  Wilfred Lacy, NP
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
## 2018-03-26 NOTE — Patient Instructions (Addendum)
Discuss with allergist about use of montelukast or zolair or xyzal?  No fracture. Complete oral abx to treat cellulitis. Use compression stocking and cold compress to help with swelling. Use cold compress 2times a day (10-3mins at a time).  Hematoma A hematoma is a collection of blood. The collection of blood can turn into a hard, painful lump under the skin. Your skin may turn blue or yellow if the hematoma is close to the surface of the skin. Most hematomas get better in a few days to weeks. Some hematomas are serious and need medical care. Hematomas can be very small or very big. Follow these instructions at home:  Apply ice to the injured area: ? Put ice in a plastic bag. ? Place a towel between your skin and the bag. ? Leave the ice on for 20 minutes, 2-3 times a day for the first 1 to 2 days.  After the first 2 days, switch to using warm packs on the injured area.  Raise (elevate) the injured area to lessen pain and puffiness (swelling). You may also wrap the area with an elastic bandage. Make sure the bandage is not wrapped too tight.  If you have a painful hematoma on your leg or foot, you may use crutches for a couple days.  Only take medicines as told by your doctor. Get help right away if:  Your pain gets worse.  Your pain is not controlled with medicine.  You have a fever.  Your puffiness gets worse.  Your skin turns more blue or yellow.  Your skin over the hematoma breaks or starts bleeding.  Your hematoma is in your chest or belly (abdomen) and you are short of breath, feel weak, or have a change in consciousness.  Your hematoma is on your scalp and you have a headache that gets worse or a change in alertness or consciousness. This information is not intended to replace advice given to you by your health care provider. Make sure you discuss any questions you have with your health care provider. Document Released: 10/06/2004 Document Revised: 02/04/2016 Document  Reviewed: 02/06/2013 Elsevier Interactive Patient Education  2017 [employer].

## 2018-03-26 NOTE — Patient Instructions (Signed)

## 2018-03-27 LAB — BASIC METABOLIC PANEL
BUN/Creatinine Ratio: 19 (ref 12–28)
BUN: 15 mg/dL (ref 8–27)
CALCIUM: 10.2 mg/dL (ref 8.7–10.3)
CO2: 26 mmol/L (ref 20–29)
CREATININE: 0.81 mg/dL (ref 0.57–1.00)
Chloride: 104 mmol/L (ref 96–106)
GFR, EST AFRICAN AMERICAN: 89 mL/min/{1.73_m2} (ref >59)
GFR, EST NON AFRICAN AMERICAN: 77 mL/min/{1.73_m2} (ref >59)
Glucose: 97 mg/dL (ref 65–99)
Potassium: 4.5 mmol/L (ref 3.5–5.2)
Sodium: 141 mmol/L (ref 134–144)

## 2018-03-28 ENCOUNTER — Ambulatory Visit (INDEPENDENT_AMBULATORY_CARE_PROVIDER_SITE_OTHER): Payer: Medicare HMO | Admitting: Psychiatry

## 2018-03-28 ENCOUNTER — Encounter: Payer: Self-pay | Admitting: Nurse Practitioner

## 2018-03-28 ENCOUNTER — Ambulatory Visit (INDEPENDENT_AMBULATORY_CARE_PROVIDER_SITE_OTHER): Payer: Medicare HMO | Admitting: Nurse Practitioner

## 2018-03-28 ENCOUNTER — Encounter: Payer: Self-pay | Admitting: Physical Therapy

## 2018-03-28 ENCOUNTER — Ambulatory Visit: Payer: Self-pay | Admitting: Nurse Practitioner

## 2018-03-28 ENCOUNTER — Ambulatory Visit: Payer: Medicare HMO | Admitting: Physical Therapy

## 2018-03-28 VITALS — BP 122/68 | HR 74 | Ht 63.0 in | Wt 172.0 lb

## 2018-03-28 DIAGNOSIS — R2681 Unsteadiness on feet: Secondary | ICD-10-CM | POA: Diagnosis not present

## 2018-03-28 DIAGNOSIS — R2689 Other abnormalities of gait and mobility: Secondary | ICD-10-CM

## 2018-03-28 DIAGNOSIS — R1310 Dysphagia, unspecified: Secondary | ICD-10-CM

## 2018-03-28 DIAGNOSIS — M6281 Muscle weakness (generalized): Secondary | ICD-10-CM | POA: Diagnosis not present

## 2018-03-28 DIAGNOSIS — F4323 Adjustment disorder with mixed anxiety and depressed mood: Secondary | ICD-10-CM | POA: Diagnosis not present

## 2018-03-28 MED ORDER — FLUCONAZOLE 100 MG PO TABS: 100.0000 mg | ORAL_TABLET | ORAL | 0 refills | Status: DC
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
## 2018-03-28 NOTE — Patient Instructions (Signed)
If you are age 65 or older, your body mass index should be between 23-30. Your Body mass index is 30.47 kg/m. If this is out of the aforementioned range listed, please consider follow up with your Primary Care Provider.  If you are age 66 or younger, your body mass index should be between 19-25. Your Body mass index is 30.47 kg/m. If this is out of the aformentioned range listed, please consider follow up with your Primary Care Provider.   You have been scheduled for an endoscopy. Please follow written instructions given to you at your visit today. If you use inhalers (even only as needed), please bring them with you on the day of your procedure. Your physician has requested that you go to www.startemmi.com and enter the access code given to you at your visit today. This web site gives a general overview about your procedure. However, you should still follow specific instructions given to you by our office regarding your preparation for the procedure.  Use Balneol as needed.  Thank you for choosing me and [facility].   Tye Savoy, NP

## 2018-03-29 ENCOUNTER — Other Ambulatory Visit: Payer: Self-pay | Admitting: Internal Medicine

## 2018-03-29 DIAGNOSIS — G4733 Obstructive sleep apnea (adult) (pediatric): Secondary | ICD-10-CM | POA: Diagnosis not present
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
## 2018-04-01 ENCOUNTER — Encounter: Payer: Self-pay | Admitting: Neurology

## 2018-04-02 ENCOUNTER — Ambulatory Visit: Payer: Medicare HMO | Admitting: Physical Therapy

## 2018-04-02 ENCOUNTER — Encounter: Payer: Self-pay | Admitting: Physical Therapy

## 2018-04-02 DIAGNOSIS — R2689 Other abnormalities of gait and mobility: Secondary | ICD-10-CM | POA: Diagnosis not present

## 2018-04-02 DIAGNOSIS — R2681 Unsteadiness on feet: Secondary | ICD-10-CM

## 2018-04-02 DIAGNOSIS — M6281 Muscle weakness (generalized): Secondary | ICD-10-CM
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
## 2018-04-02 NOTE — Patient Instructions (Signed)

## 2018-04-04 ENCOUNTER — Ambulatory Visit: Payer: Medicare HMO | Admitting: Physical Therapy

## 2018-04-04 ENCOUNTER — Encounter: Payer: Self-pay | Admitting: Physical Therapy

## 2018-04-04 DIAGNOSIS — M6281 Muscle weakness (generalized): Secondary | ICD-10-CM

## 2018-04-04 DIAGNOSIS — R2681 Unsteadiness on feet: Secondary | ICD-10-CM | POA: Diagnosis not present

## 2018-04-04 DIAGNOSIS — R2689 Other abnormalities of gait and mobility: Secondary | ICD-10-CM | POA: Diagnosis not present
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
## 2018-04-09 ENCOUNTER — Other Ambulatory Visit: Payer: Self-pay | Admitting: Nurse Practitioner

## 2018-04-09 DIAGNOSIS — F411 Generalized anxiety disorder: Secondary | ICD-10-CM

## 2018-04-09 NOTE — Telephone Encounter (Signed)
Please advise 

## 2018-04-09 NOTE — Telephone Encounter (Signed)
Xanax   LRF 03/08/18  #45  0 refills  LOV 03/26/18 C. [place], [place] Agent: Please be advised that RX refills may take up to 3 business days. We ask that you follow-up with your pharmacy.

## 2018-04-09 NOTE — Telephone Encounter (Signed)
Copied from [place] [id number]. Topic: Quick Communication - Rx Refill/Question >> Apr 09, 2018  9:05 AM Parke Poisson wrote: Medication: ALPRAZolam Duanne Moron) 1 MG tablet  Has the patient contacted their pharmacy? yes (Agent: If no, request that the patient contact the pharmacy for the refill.) (Agent: If yes, when and what did the pharmacy advise?)  Preferred Pharmacy (with phone number or street name): [place], [place] Agent: Please be advised that RX refills may take up to 3 business days. We ask that you follow-up with your pharmacy.

## 2018-04-10 ENCOUNTER — Encounter: Payer: Self-pay | Admitting: Internal Medicine

## 2018-04-10 ENCOUNTER — Ambulatory Visit (AMBULATORY_SURGERY_CENTER): Payer: Medicare HMO | Admitting: Internal Medicine

## 2018-04-10 ENCOUNTER — Other Ambulatory Visit: Payer: Self-pay | Admitting: Nurse Practitioner

## 2018-04-10 VITALS — BP 174/92 | HR 58 | Temp 98.9°F | Resp 9 | Ht 63.0 in | Wt 172.0 lb

## 2018-04-10 DIAGNOSIS — F411 Generalized anxiety disorder: Secondary | ICD-10-CM

## 2018-04-10 DIAGNOSIS — R1310 Dysphagia, unspecified: Secondary | ICD-10-CM

## 2018-04-10 MED ORDER — ALPRAZOLAM 1 MG PO TABS: 0.5000 mg | ORAL_TABLET | Freq: Two times a day (BID) | ORAL | 2 refills | Status: DC | PRN

## 2018-04-10 MED ORDER — SODIUM CHLORIDE 0.9 % IV SOLN: 500.0000 mL | Freq: Once | INTRAVENOUS | Status: DC

## 2018-04-10 MED ORDER — ALPRAZOLAM 1 MG PO TABS: 0.5000 mg | ORAL_TABLET | Freq: Three times a day (TID) | ORAL | 2 refills | Status: DC | PRN

## 2018-04-10 NOTE — Op Note (Signed)
[facility] Patient Name: [name] Procedure Date: 04/10/2018 10:40 AM MRN: [id number] Endoscopist: Jerene Bears , MD Age: 67 Referring MD:  Date of Birth: Aug 28, 1951 Gender: Female Account #: [id number] Procedure:                Upper GI endoscopy Indications:              Dysphagia, history of atypical chest pain, history                            of candida esophagitis treated empirically for the                            same early this month Medicines:                Monitored Anesthesia Care Procedure:                Pre-Anesthesia Assessment:                           - Prior to the procedure, a History and Physical                            was performed, and patient medications and                            allergies were reviewed. The patient's tolerance of                            previous anesthesia was also reviewed. The risks                            and benefits of the procedure and the sedation                            options and risks were discussed with the patient.                            All questions were answered, and informed consent                            was obtained. Prior Anticoagulants: The patient has                            taken no previous anticoagulant or antiplatelet                            agents. ASA Grade Assessment: III - A patient with                            severe systemic disease. After reviewing the risks                            and benefits, the patient was deemed in  satisfactory condition to undergo the procedure.                           After obtaining informed consent, the endoscope was                            passed under direct vision. Throughout the                            procedure, the patient's blood pressure, pulse, and                            oxygen saturations were monitored continuously. The                            Model GIF-[id number])  scope was introduced                            through the mouth, and advanced to the second part                            of duodenum. The upper GI endoscopy was                            accomplished without difficulty. The patient                            tolerated the procedure well. Scope In: Scope Out: Findings:                 Normal mucosa was found in the entire esophagus.                           No endoscopic abnormality was evident in the                            esophagus to explain the patient's complaint of                            dysphagia. It was decided, however given favorable                            response to previous dilation, to proceed with                            dilation of the entire esophagus. A guidewire was                            placed and the scope was withdrawn. Dilation was                            performed with a Savary dilator with mild  resistance at 18 mm.                           The cardia and gastric fundus were normal on                            retroflexion.                           The entire examined stomach was normal.                           The examined duodenum was normal. Complications:            No immediate complications. Estimated Blood Loss:     Estimated blood loss: none. Impression:               - Normal mucosa was found in the entire esophagus.                           - Dilation to 18 mm with Savary.                           - Normal stomach.                           - Normal examined duodenum.                           - No specimens collected. Recommendation:           - Patient has a contact number available for                            emergencies. The signs and symptoms of potential                            delayed complications were discussed with the                            patient. Return to normal activities tomorrow.                             Written discharge instructions were provided to the                            patient.                           - Resume previous diet.                           - Continue present medications. Jerene Bears, MD 04/10/2018 10:54:55 AM This report has been signed electronically.

## 2018-04-10 NOTE — Progress Notes (Signed)
Has been on Halter monitor for 2 weeks d/t palpitations- doesn't have on at this time.  [place] CRNA and Dr. Hilarie Fredrickson made aware.  No c/o chest discomfort, palpitations or SOB today.  Ok to proceed as scheduled

## 2018-04-10 NOTE — Progress Notes (Signed)
Called to room to assist during endoscopic procedure.  Patient ID and intended procedure confirmed with present staff. Received instructions for my participation in the procedure from the performing physician.  

## 2018-04-10 NOTE — Progress Notes (Signed)
I assisted pt with dressing. maw

## 2018-04-10 NOTE — Progress Notes (Signed)
No problems noted in the recovery room. Maw   Dr. Hilarie Fredrickson report said to "resume previous diet".  He gave a verbal order pt to be on the dilatation diet the rest of today.  Handout was given to pt's care partner.  maw

## 2018-04-10 NOTE — Patient Instructions (Signed)
YOU HAD AN ENDOSCOPIC PROCEDURE TODAY AT [facility]:   Refer to the procedure report that was given to you for any specific questions about what was found during the examination.  If the procedure report does not answer your questions, please call your gastroenterologist to clarify.  If you requested that your care partner not be given the details of your procedure findings, then the procedure report has been included in a sealed envelope for you to review at your convenience later.  YOU SHOULD EXPECT: Some feelings of bloating in the abdomen. Passage of more gas than usual.  Walking can help get rid of the air that was put into your GI tract during the procedure and reduce the bloating. If you had a lower endoscopy (such as a colonoscopy or flexible sigmoidoscopy) you may notice spotting of blood in your stool or on the toilet paper. If you underwent a bowel prep for your procedure, you may not have a normal bowel movement for a few days.  Please Note:  You might notice some irritation and congestion in your nose or some drainage.  This is from the oxygen used during your procedure.  There is no need for concern and it should clear up in a day or so.  SYMPTOMS TO REPORT IMMEDIATELY:   Following upper endoscopy (EGD)  Vomiting of blood or coffee ground material  New chest pain or pain under the shoulder blades  Painful or persistently difficult swallowing  New shortness of breath  Fever of 100F or higher  Black, tarry-looking stools  For urgent or emergent issues, a gastroenterologist can be reached at any hour by calling [phone].   DIET:   Please follow the dilatation diet the rest of today.  Handout was given to your care partner.  Drink plenty of fluids but you should avoid alcoholic beverages for 24 hours.  ACTIVITY:  You should plan to take it easy for the rest of today and you should NOT DRIVE or use heavy machinery until tomorrow (because of the sedation  medicines used during the test).    FOLLOW UP: Our staff will call the number listed on your records the next business day following your procedure to check on you and address any questions or concerns that you may have regarding the information given to you following your procedure. If we do not reach you, we will leave a message.  However, if you are feeling well and you are not experiencing any problems, there is no need to return our call.  We will assume that you have returned to your regular daily activities without incident.  If any biopsies were taken you will be contacted by phone or by letter within the next 1-3 weeks.  Please call us at [phone] if you have not heard about the biopsies in 3 weeks.    SIGNATURES/CONFIDENTIALITY: You and/or your care partner have signed paperwork which will be entered into your electronic medical record.  These signatures attest to the fact that that the information above on your After Visit Summary has been reviewed and is understood.  Full responsibility of the confidentiality of this discharge information lies with you and/or your care-partner.    Handout was given to your care partner on the dilatation diet to follow the rest of today. You may resume your current medications today. Please call if any questions or concerns.

## 2018-04-10 NOTE — Progress Notes (Signed)
To PACU, VSS> REport to RN>TB

## 2018-04-11 ENCOUNTER — Telehealth: Payer: Self-pay | Admitting: *Deleted

## 2018-04-11 ENCOUNTER — Other Ambulatory Visit: Payer: Self-pay | Admitting: Internal Medicine

## 2018-04-11 NOTE — Telephone Encounter (Signed)
  Follow up Call-  Call back number 04/10/2018  Post procedure Call Back phone  # [phone]  Permission to leave phone message Yes  Some recent data might be hidden     Patient questions:  Do you have a fever, pain , or abdominal swelling? No. Pain Score  0 *  Have you tolerated food without any problems? Yes.    Have you been able to return to your normal activities? Yes.    Do you have any questions about your discharge instructions: Diet   No. Medications  No. Follow up visit  No.  Do you have questions or concerns about your Care? No.  Actions: * If pain score is 4 or above: No action needed, pain <4.

## 2018-04-16 ENCOUNTER — Other Ambulatory Visit: Payer: Self-pay | Admitting: Nurse Practitioner

## 2018-04-16 ENCOUNTER — Other Ambulatory Visit: Payer: Self-pay | Admitting: Adult Health

## 2018-04-16 DIAGNOSIS — Z1231 Encounter for screening mammogram for malignant neoplasm of breast: Secondary | ICD-10-CM

## 2018-04-23 ENCOUNTER — Other Ambulatory Visit: Payer: Self-pay

## 2018-04-23 ENCOUNTER — Encounter: Payer: Self-pay | Admitting: Cardiology

## 2018-04-23 DIAGNOSIS — I5032 Chronic diastolic (congestive) heart failure: Secondary | ICD-10-CM
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
## 2018-04-25 ENCOUNTER — Encounter: Payer: Self-pay | Admitting: Cardiology

## 2018-04-25 ENCOUNTER — Ambulatory Visit (INDEPENDENT_AMBULATORY_CARE_PROVIDER_SITE_OTHER): Payer: Medicare HMO | Admitting: Cardiology

## 2018-04-25 VITALS — BP 168/88 | HR 63 | Ht 63.0 in | Wt 171.2 lb

## 2018-04-25 DIAGNOSIS — R072 Precordial pain: Secondary | ICD-10-CM

## 2018-04-25 DIAGNOSIS — R002 Palpitations: Secondary | ICD-10-CM

## 2018-04-25 DIAGNOSIS — I10 Essential (primary) hypertension: Secondary | ICD-10-CM | POA: Diagnosis not present

## 2018-04-25 DIAGNOSIS — I5032 Chronic diastolic (congestive) heart failure: Secondary | ICD-10-CM | POA: Diagnosis not present

## 2018-04-25 NOTE — Patient Instructions (Signed)
Medication Instructions:  Continue current medications  If you need a refill on your cardiac medications before your next appointment, please call your pharmacy.  Labwork: None Ordered  Testing/Procedures: None Ordered  Follow-Up: Your physician wants you to follow-up in: As Needed.      Thank you for choosing [facility]!!       

## 2018-04-26 ENCOUNTER — Ambulatory Visit (INDEPENDENT_AMBULATORY_CARE_PROVIDER_SITE_OTHER): Payer: Medicare HMO | Admitting: Psychiatry

## 2018-04-26 DIAGNOSIS — F4323 Adjustment disorder with mixed anxiety and depressed mood: Secondary | ICD-10-CM | POA: Diagnosis not present

## 2018-04-26 LAB — BASIC METABOLIC PANEL
BUN / CREAT RATIO: 21 (ref 12–28)
BUN: 18 mg/dL (ref 8–27)
CHLORIDE: 103 mmol/L (ref 96–106)
CO2: 26 mmol/L (ref 20–29)
Calcium: 10.2 mg/dL (ref 8.7–10.3)
Creatinine, Ser: 0.85 mg/dL (ref 0.57–1.00)
GFR calc Af Amer: 83 mL/min/{1.73_m2} (ref >59)
GFR calc non Af Amer: 72 mL/min/{1.73_m2} (ref >59)
Glucose: 97 mg/dL (ref 65–99)
Potassium: 4.5 mmol/L (ref 3.5–5.2)
SODIUM: 141 mmol/L (ref 134–144)

## 2018-04-29 DIAGNOSIS — G4733 Obstructive sleep apnea (adult) (pediatric): Secondary | ICD-10-CM | POA: Diagnosis not present

## 2018-05-03 ENCOUNTER — Ambulatory Visit: Payer: Medicare HMO | Admitting: Psychiatry

## 2018-05-03 ENCOUNTER — Ambulatory Visit (INDEPENDENT_AMBULATORY_CARE_PROVIDER_SITE_OTHER): Payer: Medicare HMO | Admitting: Psychiatry

## 2018-05-03 DIAGNOSIS — F4323 Adjustment disorder with mixed anxiety and depressed mood: Secondary | ICD-10-CM | POA: Diagnosis not present

## 2018-05-08 ENCOUNTER — Ambulatory Visit (INDEPENDENT_AMBULATORY_CARE_PROVIDER_SITE_OTHER): Payer: Medicare HMO | Admitting: Psychiatry

## 2018-05-08 DIAGNOSIS — F4323 Adjustment disorder with mixed anxiety and depressed mood: Secondary | ICD-10-CM | POA: Diagnosis not present

## 2018-05-11 ENCOUNTER — Ambulatory Visit (INDEPENDENT_AMBULATORY_CARE_PROVIDER_SITE_OTHER): Payer: Medicare HMO | Admitting: Nurse Practitioner

## 2018-05-11 ENCOUNTER — Ambulatory Visit (INDEPENDENT_AMBULATORY_CARE_PROVIDER_SITE_OTHER): Payer: Medicare HMO

## 2018-05-11 ENCOUNTER — Encounter: Payer: Self-pay | Admitting: Nurse Practitioner

## 2018-05-11 VITALS — BP 174/98 | HR 65 | Temp 98.1°F | Ht 63.0 in | Wt 168.0 lb

## 2018-05-11 DIAGNOSIS — R05 Cough: Secondary | ICD-10-CM

## 2018-05-11 DIAGNOSIS — R059 Cough, unspecified: Secondary | ICD-10-CM

## 2018-05-11 DIAGNOSIS — J029 Acute pharyngitis, unspecified: Secondary | ICD-10-CM

## 2018-05-11 MED ORDER — BENZONATATE 100 MG PO CAPS
100.0000 mg | ORAL_CAPSULE | Freq: Three times a day (TID) | ORAL | 1 refills | Status: DC | PRN
Start: 2018-05-11 — End: 2019-01-09

## 2018-05-11 NOTE — Progress Notes (Signed)
Subjective:  Patient ID: [name], female    DOB: 1951-02-18  Age: 67 y.o. MRN: [id number]  CC: Cough (coughing,chest and nose congestions,ear pain,pain near right breast area. this has been going on for 1 wk. took bynadryl and allergy med. )   Cough  This is a recurrent problem. The current episode started in the past 7 days. The problem has been waxing and waning. The cough is non-productive. Associated symptoms include chest pain, chills, ear congestion, ear pain, nasal congestion, postnasal drip, rhinorrhea and a sore throat. Pertinent negatives include no fever, headaches, heartburn, myalgias, rash, shortness of breath or wheezing. The symptoms are aggravated by lying down. She has tried OTC cough suppressant and prescription cough suppressant for the symptoms. The treatment provided significant relief. Her past medical history is significant for asthma, bronchitis and environmental allergies.   Reviewed past Medical, Social and Family history today.  Outpatient Medications Prior to Visit  Medication Sig Dispense Refill  . acetaminophen (TYLENOL) 500 MG tablet Take 500-1,000 mg by mouth every 6 (six) hours as needed for moderate pain.     Marland Kitchen ALPRAZolam (XANAX) 1 MG tablet Take 0.5 tablets (0.5 mg total) by mouth 3 (three) times daily as needed for anxiety. 45 tablet 2  . ascorbic acid (VITAMIN C) 1000 MG tablet Take 1,000 mg by mouth daily.     Marland Kitchen aspirin EC 81 MG tablet Take 81 mg by mouth daily.    Marland Kitchen BIOTIN PO Take 1 tablet by mouth every morning. Hair Skin and Nails    . Calcium Carbonate-Vitamin D (CALCIUM-CARB 600 + D) 600-125 MG-UNIT TABS Take 1 tablet by mouth daily.     . cetirizine (ZYRTEC) 10 MG tablet Take 1 tablet (10 mg total) by mouth daily. 14 tablet 0  . EPINEPHrine (EPIPEN 2-PAK) 0.3 mg/0.3 mL IJ SOAJ injection Inject into the muscle.    Marland Kitchen FLOVENT HFA 110 MCG/ACT inhaler Inhale 2 puffs into the lungs 2 (two) times daily.     . Fluticasone-Salmeterol (ADVAIR DISKUS)  100-50 MCG/DOSE AEPB Inhale 2 puffs into the lungs 2 (two) times daily.     Marland Kitchen ipratropium (ATROVENT) 0.03 % nasal spray Place 1 spray into both nostrils 2 (two) times daily. 30 mL 1  . losartan (COZAAR) 100 MG tablet Take 1 tablet (100 mg total) by mouth daily. 90 tablet 1  . magnesium chloride (SLOW-MAG) 64 MG TBEC SR tablet Take 2 tablets (128 mg total) by mouth daily. 15 tablet 0  . Magnesium Oxide 400 (240 Mg) MG TABS Take by mouth.    . metoprolol tartrate (LOPRESSOR) 100 MG tablet Take 1 tablet (100 mg total) by mouth 2 (two) times daily. 180 tablet 1  . nitroGLYCERIN (NITROSTAT) 0.4 MG SL tablet Place 0.4 mg under the tongue every 5 (five) minutes as needed for chest pain.     Marland Kitchen nystatin (MYCOSTATIN) 100000 UNIT/ML suspension Take 5 mLs (500,000 Units total) by mouth 4 (four) times daily. 200 mL 0  . pantoprazole (PROTONIX) 40 MG tablet Take 1 tablet (40 mg total) by mouth daily. 90 tablet 3  . potassium chloride (K-DUR) 10 MEQ tablet Take 1 tablet (10 mEq total) by mouth every other day. 90 tablet 3  . pravastatin (PRAVACHOL) 40 MG tablet TAKE 1 TABLET BY MOUTH IN THE EVENING 90 tablet 1  . RESTASIS 0.05 % ophthalmic emulsion Place 1 drop into both eyes 2 (two) times daily.  3  . spironolactone (ALDACTONE) 25 MG tablet Take 1 tablet (  25 mg total) by mouth daily. 90 tablet 3  . vitamin B-12 (CYANOCOBALAMIN) 1000 MCG tablet Take 1 tablet (1,000 mcg total) by mouth daily. 30 tablet 1  . vitamin E (VITAMIN E) 400 UNIT capsule Take 400 Units by mouth daily.    . benzonatate (TESSALON) 100 MG capsule Take 1 capsule (100 mg total) by mouth 3 (three) times daily as needed for cough. 20 capsule 0  . fluconazole (DIFLUCAN) 100 MG tablet Take 1 tablet (100 mg total) by mouth as directed. Take 200 mg today and then 100 mg daily for 7 days. (Patient not taking: Reported on 05/11/2018) 9 tablet 0  . fluticasone (FLONASE) 50 MCG/ACT nasal spray Place 2 sprays into both nostrils daily. (Patient not taking:  Reported on 05/11/2018) 16 g 0  . guaiFENesin-dextromethorphan (ROBITUSSIN DM) 100-10 MG/5ML syrup Take 5 mLs by mouth every 4 (four) hours as needed for cough. (Patient not taking: Reported on 05/11/2018) 118 mL 0  . hydrocortisone (ANUSOL-HC) 25 MG suppository Place 1 suppository (25 mg total) rectally 2 (two) times daily. (Patient not taking: Reported on 05/11/2018) 12 suppository 0   No facility-administered medications prior to visit.     ROS See HPI  Objective:  BP (!) 174/98   Pulse 65   Temp 98.1 F (36.7 C) (Oral)   Ht 5\' 3"  (1.6 m)   Wt 168 lb (76.2 kg)   SpO2 98%   BMI 29.76 kg/m   BP Readings from Last 3 Encounters:  05/11/18 (!) 174/98  04/25/18 (!) 168/88  04/10/18 (!) 174/92    Wt Readings from Last 3 Encounters:  05/11/18 168 lb (76.2 kg)  04/25/18 171 lb 3.2 oz (77.7 kg)  04/10/18 172 lb (78 kg)    Physical Exam  Constitutional: She is oriented to person, place, and time. No distress.  HENT:  Right Ear: Tympanic membrane, external ear and ear canal normal. No mastoid tenderness.  Left Ear: Tympanic membrane, external ear and ear canal normal. No mastoid tenderness.  Nose: Rhinorrhea present. No mucosal edema.  Mouth/Throat: Uvula is midline. Posterior oropharyngeal erythema present. No oropharyngeal exudate or posterior oropharyngeal edema.  Cardiovascular: Normal rate and regular rhythm.  Pulmonary/Chest: Effort normal and breath sounds normal. She exhibits tenderness.  Lymphadenopathy:    She has no cervical adenopathy.  Neurological: She is alert and oriented to person, place, and time.  Vitals reviewed.   Lab Results  Component Value Date   WBC 4.2 02/25/2018   HGB 12.3 02/25/2018   HCT 37.8 02/25/2018   PLT 176 02/25/2018   GLUCOSE 97 04/25/2018   CHOL 145 01/30/2018   TRIG 189.0 (H) 01/30/2018   HDL 38.40 (L) 01/30/2018   LDLCALC 69 01/30/2018   ALT 10 01/30/2018   AST 11 01/30/2018   NA 141 04/25/2018   K 4.5 04/25/2018   CL 103  04/25/2018   CREATININE 0.85 04/25/2018   BUN 18 04/25/2018   CO2 26 04/25/2018   TSH 0.71 01/30/2018   INR 1.02 08/17/2013   HGBA1C 5.9 01/30/2018    Assessment & Plan:   Keina was seen today for cough.  Diagnoses and all orders for this visit:  Cough -     DG Chest 2 View -     benzonatate (TESSALON) 100 MG capsule; Take 1 capsule (100 mg total) by mouth 3 (three) times daily as needed for cough.  Sorethroat   I am having Mitchel Honour maintain her Calcium Carbonate-Vitamin D, vitamin E, ascorbic acid, aspirin  EC, acetaminophen, nitroGLYCERIN, BIOTIN PO, magnesium chloride, pantoprazole, RESTASIS, FLOVENT HFA, Fluticasone-Salmeterol, hydrocortisone, vitamin B-12, losartan, metoprolol tartrate, pravastatin, Magnesium Oxide, EPINEPHrine, fluticasone, guaiFENesin-dextromethorphan, cetirizine, ipratropium, spironolactone, potassium chloride, nystatin, fluconazole, ALPRAZolam, and benzonatate.  Meds ordered this encounter  Medications  . benzonatate (TESSALON) 100 MG capsule    Sig: Take 1 capsule (100 mg total) by mouth 3 (three) times daily as needed for cough.    Dispense:  60 capsule    Refill:  1    Order Specific Question:   Supervising Provider    Answer:   MATTHEWS, CODY [[id number]]    Follow-up: Return if symptoms worsen or fail to improve.  Wilfred Lacy, NP

## 2018-05-11 NOTE — Patient Instructions (Addendum)
No acute finding on CXR.  Use chloraseptic spray or warm salt water gaggle for sore throat.  Continue robitussim DM or benzonatate for cough.  Maintain adequate oral hydration.

## 2018-05-14 ENCOUNTER — Encounter: Payer: Self-pay | Admitting: Nurse Practitioner

## 2018-05-15 ENCOUNTER — Ambulatory Visit (INDEPENDENT_AMBULATORY_CARE_PROVIDER_SITE_OTHER): Payer: Medicare HMO | Admitting: Neurology

## 2018-05-15 ENCOUNTER — Encounter: Payer: Self-pay | Admitting: Neurology

## 2018-05-15 VITALS — BP 155/82 | HR 68 | Ht 63.0 in | Wt 168.0 lb

## 2018-05-15 DIAGNOSIS — G4733 Obstructive sleep apnea (adult) (pediatric): Secondary | ICD-10-CM

## 2018-05-15 DIAGNOSIS — Z9989 Dependence on other enabling machines and devices: Secondary | ICD-10-CM

## 2018-05-15 DIAGNOSIS — Z789 Other specified health status: Secondary | ICD-10-CM | POA: Diagnosis not present
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
## 2018-05-15 NOTE — Patient Instructions (Signed)
Please continue using your autoPAP regularly. While your insurance requires that you use PAP at least 4 hours each night on 70% of the nights, I recommend, that you not skip any nights and use it throughout the night if you can. Getting used to PAP and staying with the treatment long term does take time and patience and discipline. Untreated obstructive sleep apnea when it is moderate to severe can have an adverse impact on cardiovascular health and raise her risk for heart disease, arrhythmias, hypertension, congestive heart failure, stroke and diabetes. Untreated obstructive sleep apnea causes sleep disruption, nonrestorative sleep, and sleep deprivation. This can have an impact on your day to day functioning and cause daytime sleepiness and impairment of cognitive function, memory loss, mood disturbance, and problems focussing. Using PAP regularly can improve these symptoms.  Please make an appointment with your DME company, [employer], for a mask refit and explain your mask and headgear issues, mouth dryness, air leak ets. Please have them explain how to change the humidifier setting and ramp time, if needed.   If needed down the road we can consider a dental device.  For now, please follow up in 6 months.

## 2018-05-16 DIAGNOSIS — K219 Gastro-esophageal reflux disease without esophagitis: Secondary | ICD-10-CM | POA: Diagnosis not present

## 2018-05-16 DIAGNOSIS — J3089 Other allergic rhinitis: Secondary | ICD-10-CM | POA: Diagnosis not present

## 2018-05-16 DIAGNOSIS — J4540 Moderate persistent asthma, uncomplicated: Secondary | ICD-10-CM | POA: Diagnosis not present

## 2018-05-16 DIAGNOSIS — H1045 Other chronic allergic conjunctivitis: Secondary | ICD-10-CM | POA: Diagnosis not present

## 2018-05-24 ENCOUNTER — Ambulatory Visit: Payer: Medicare HMO | Admitting: Psychiatry

## 2018-05-30 DIAGNOSIS — G4733 Obstructive sleep apnea (adult) (pediatric): Secondary | ICD-10-CM | POA: Diagnosis not present

## 2018-06-01 ENCOUNTER — Telehealth: Payer: Self-pay | Admitting: Hematology and Oncology

## 2018-06-01 NOTE — Telephone Encounter (Signed)
[facility] - moved appointment from 10/4 to 10/7. Spoke with patient.

## 2018-06-09 ENCOUNTER — Encounter: Payer: Self-pay | Admitting: Neurology

## 2018-06-12 ENCOUNTER — Encounter: Payer: Self-pay | Admitting: Nurse Practitioner

## 2018-06-12 ENCOUNTER — Ambulatory Visit (INDEPENDENT_AMBULATORY_CARE_PROVIDER_SITE_OTHER): Payer: Medicare HMO | Admitting: Nurse Practitioner

## 2018-06-12 VITALS — BP 162/90 | HR 69 | Temp 98.6°F | Ht 63.0 in | Wt 166.0 lb

## 2018-06-12 DIAGNOSIS — R110 Nausea: Secondary | ICD-10-CM | POA: Diagnosis not present

## 2018-06-12 DIAGNOSIS — R7303 Prediabetes: Secondary | ICD-10-CM | POA: Diagnosis not present

## 2018-06-12 DIAGNOSIS — R197 Diarrhea, unspecified: Secondary | ICD-10-CM

## 2018-06-12 DIAGNOSIS — I10 Essential (primary) hypertension: Secondary | ICD-10-CM

## 2018-06-12 DIAGNOSIS — E782 Mixed hyperlipidemia: Secondary | ICD-10-CM

## 2018-06-12 DIAGNOSIS — Z23 Encounter for immunization: Secondary | ICD-10-CM

## 2018-06-12 DIAGNOSIS — R1013 Epigastric pain: Secondary | ICD-10-CM

## 2018-06-12 LAB — LIPASE: LIPASE: 38 U/L (ref 11.0–59.0)

## 2018-06-12 LAB — CBC WITH DIFFERENTIAL/PLATELET
Basophils Absolute: 0 10*3/uL (ref 0.0–0.1)
Basophils Relative: 0.5 % (ref 0.0–3.0)
EOS ABS: 0 10*3/uL (ref 0.0–0.7)
Eosinophils Relative: 0.6 % (ref 0.0–5.0)
HEMATOCRIT: 41.2 % (ref 36.0–46.0)
Hemoglobin: 13.7 g/dL (ref 12.0–15.0)
LYMPHS PCT: 19.5 % (ref 12.0–46.0)
Lymphs Abs: 1.4 10*3/uL (ref 0.7–4.0)
MCHC: 33.3 g/dL (ref 30.0–36.0)
MCV: 91 fl (ref 78.0–100.0)
Monocytes Absolute: 0.5 10*3/uL (ref 0.1–1.0)
Monocytes Relative: 6.8 % (ref 3.0–12.0)
NEUTROS ABS: 5.2 10*3/uL (ref 1.4–7.7)
NEUTROS PCT: 72.6 % (ref 43.0–77.0)
PLATELETS: 196 10*3/uL (ref 150.0–400.0)
RBC: 4.53 Mil/uL (ref 3.87–5.11)
RDW: 14.9 % (ref 11.5–15.5)
WBC: 7.1 10*3/uL (ref 4.0–10.5)

## 2018-06-12 LAB — BASIC METABOLIC PANEL
BUN: 13 mg/dL (ref 6–23)
CHLORIDE: 103 meq/L (ref 96–112)
CO2: 32 mEq/L (ref 19–32)
Calcium: 10.7 mg/dL — ABNORMAL HIGH (ref 8.4–10.5)
Creatinine, Ser: 0.83 mg/dL (ref 0.40–1.20)
GFR: 73.3 mL/min (ref >60.00)
Glucose, Bld: 120 mg/dL — ABNORMAL HIGH (ref 70–99)
POTASSIUM: 4.8 meq/L (ref 3.5–5.1)
Sodium: 140 mEq/L (ref 135–145)

## 2018-06-12 LAB — HEPATIC FUNCTION PANEL
ALBUMIN: 4.3 g/dL (ref 3.5–5.2)
ALK PHOS: 55 U/L (ref 39–117)
ALT: 9 U/L (ref 0–35)
AST: 13 U/L (ref 0–37)
Bilirubin, Direct: 0.2 mg/dL (ref 0.0–0.3)
Total Bilirubin: 1.2 mg/dL (ref 0.2–1.2)
Total Protein: 7.1 g/dL (ref 6.0–8.3)

## 2018-06-12 LAB — AMYLASE: AMYLASE: 33 U/L (ref 27–131)

## 2018-06-12 LAB — HEMOGLOBIN A1C: HEMOGLOBIN A1C: 6.1 % (ref 4.6–6.5)

## 2018-06-12 MED ORDER — ONDANSETRON HCL 4 MG PO TABS: 4.0000 mg | ORAL_TABLET | Freq: Three times a day (TID) | ORAL | 0 refills | Status: DC | PRN

## 2018-06-12 MED ORDER — PRAVASTATIN SODIUM 40 MG PO TABS: ORAL_TABLET | ORAL | 3 refills | Status: DC

## 2018-06-12 MED ORDER — LOSARTAN POTASSIUM 100 MG PO TABS: 100.0000 mg | ORAL_TABLET | Freq: Every day | ORAL | 3 refills | Status: DC

## 2018-06-12 MED ORDER — METOPROLOL TARTRATE 100 MG PO TABS: 100.0000 mg | ORAL_TABLET | Freq: Two times a day (BID) | ORAL | 3 refills | Status: DC

## 2018-06-12 MED ORDER — SPIRONOLACTONE 25 MG PO TABS: 25.0000 mg | ORAL_TABLET | Freq: Two times a day (BID) | ORAL | 3 refills | Status: DC

## 2018-06-12 NOTE — Progress Notes (Signed)
Subjective:  Patient ID: [name], female    DOB: November 17, 1950  Age: 67 y.o. MRN: [id number]  CC: Follow-up (follow up BP/patient is complaining of fatigue,diarrhea in morning and do not feel like eatting. coughing is back,drainage? flu shot consult)  Diarrhea   This is a new problem. The current episode started 1 to 4 weeks ago. The problem occurs 2 to 4 times per day. The problem has been unchanged. The stool consistency is described as watery. The patient states that diarrhea does not awaken her from sleep. Associated symptoms include abdominal pain, bloating, chills and sweats. Pertinent negatives include no coughing, fever, headaches, increased  flatus, myalgias, URI, vomiting or weight loss. Nothing aggravates the symptoms. There are no known risk factors. She has tried nothing for the symptoms.  hx of diverticulosis without diverticulitis Colonoscopy done 2013 and cologuard done 2018 (normal) CT ABD/pelvis done 09/2017: diverticulosis  Sore throat and Hoarseness: Chronic, intermittent Use of flovent BID prescribed by Dr. Janese Banks (allergist and asthma). Admits to not rinsing mouth after use. Use of protonix 40mg  Endoscopy done 03/2018 by Dr. Hilarie Fredrickson.  HTN: Uncontrolled. Use of losartan 100mg , metoprolol 100mg  BID, and spironolactone 25mg . BP Readings from Last 3 Encounters:  06/12/18 (!) 162/90  05/15/18 (!) 155/82  05/11/18 (!) 174/98  evaluated by cardiology: lexiscan and event monitor done (no abnormal finding). No change in medication regimen by cardiology. Negative for adrenal gland tumor. Sleep study repeated 02/2018 by neurology: mild OSA, autopap recommended.  Reviewed past Medical, Social and Family history today.  Outpatient Medications Prior to Visit  Medication Sig Dispense Refill  . acetaminophen (TYLENOL) 500 MG tablet Take 500-1,000 mg by mouth every 6 (six) hours as needed for moderate pain.     Marland Kitchen ALPRAZolam (XANAX) 1 MG tablet Take 0.5 tablets  (0.5 mg total) by mouth 3 (three) times daily as needed for anxiety. 45 tablet 2  . ascorbic acid (VITAMIN C) 1000 MG tablet Take 1,000 mg by mouth daily.     Marland Kitchen aspirin EC 81 MG tablet Take 81 mg by mouth daily.    . benzonatate (TESSALON) 100 MG capsule Take 1 capsule (100 mg total) by mouth 3 (three) times daily as needed for cough. 60 capsule 1  . BIOTIN PO Take 1 tablet by mouth every morning. Hair Skin and Nails    . Calcium Carbonate-Vitamin D (CALCIUM-CARB 600 + D) 600-125 MG-UNIT TABS Take 1 tablet by mouth daily.     . cetirizine (ZYRTEC) 10 MG tablet Take 1 tablet (10 mg total) by mouth daily. 14 tablet 0  . EPINEPHrine (EPIPEN 2-PAK) 0.3 mg/0.3 mL IJ SOAJ injection Inject into the muscle.    Marland Kitchen FLOVENT HFA 110 MCG/ACT inhaler Inhale 2 puffs into the lungs 2 (two) times daily.     . hydrocortisone (ANUSOL-HC) 25 MG suppository Place 1 suppository (25 mg total) rectally 2 (two) times daily. 12 suppository 0  . ipratropium (ATROVENT) 0.03 % nasal spray Place 1 spray into both nostrils 2 (two) times daily. 30 mL 1  . magnesium chloride (SLOW-MAG) 64 MG TBEC SR tablet Take 2 tablets (128 mg total) by mouth daily. 15 tablet 0  . Magnesium Oxide 400 (240 Mg) MG TABS Take by mouth.    . nitroGLYCERIN (NITROSTAT) 0.4 MG SL tablet Place 0.4 mg under the tongue every 5 (five) minutes as needed for chest pain.     Marland Kitchen nystatin (MYCOSTATIN) 100000 UNIT/ML suspension Take 5 mLs (500,000 Units total) by mouth 4 (  four) times daily. 200 mL 0  . pantoprazole (PROTONIX) 40 MG tablet Take 1 tablet (40 mg total) by mouth daily. 90 tablet 3  . RESTASIS 0.05 % ophthalmic emulsion Place 1 drop into both eyes 2 (two) times daily.  3  . vitamin B-12 (CYANOCOBALAMIN) 1000 MCG tablet Take 1 tablet (1,000 mcg total) by mouth daily. 30 tablet 1  . vitamin E (VITAMIN E) 400 UNIT capsule Take 400 Units by mouth daily.    . Fluticasone-Salmeterol (ADVAIR DISKUS) 100-50 MCG/DOSE AEPB Inhale 2 puffs into the lungs 2 (two)  times daily.     Marland Kitchen losartan (COZAAR) 100 MG tablet Take 1 tablet (100 mg total) by mouth daily. 90 tablet 1  . metoprolol tartrate (LOPRESSOR) 100 MG tablet Take 1 tablet (100 mg total) by mouth 2 (two) times daily. 180 tablet 1  . potassium chloride (K-DUR) 10 MEQ tablet Take 1 tablet (10 mEq total) by mouth every other day. 90 tablet 3  . pravastatin (PRAVACHOL) 40 MG tablet TAKE 1 TABLET BY MOUTH IN THE EVENING 90 tablet 1  . spironolactone (ALDACTONE) 25 MG tablet Take 1 tablet (25 mg total) by mouth daily. 90 tablet 3  . fluconazole (DIFLUCAN) 100 MG tablet Take 1 tablet (100 mg total) by mouth as directed. Take 200 mg today and then 100 mg daily for 7 days. (Patient not taking: Reported on 06/12/2018) 9 tablet 0  . guaiFENesin-dextromethorphan (ROBITUSSIN DM) 100-10 MG/5ML syrup Take 5 mLs by mouth every 4 (four) hours as needed for cough. (Patient not taking: Reported on 06/12/2018) 118 mL 0   No facility-administered medications prior to visit.     ROS See HPI  Objective:  BP (!) 162/90   Pulse 69   Temp 98.6 F (37 C) (Oral)   Ht 5\' 3"  (1.6 m)   Wt 166 lb (75.3 kg)   SpO2 98%   BMI 29.41 kg/m   BP Readings from Last 3 Encounters:  06/12/18 (!) 162/90  05/15/18 (!) 155/82  05/11/18 (!) 174/98    Wt Readings from Last 3 Encounters:  06/12/18 166 lb (75.3 kg)  05/15/18 168 lb (76.2 kg)  05/11/18 168 lb (76.2 kg)    Physical Exam  Constitutional: She is oriented to person, place, and time. She appears well-developed and well-nourished.  HENT:  Right Ear: External ear normal. Tympanic membrane is not injected and not erythematous. A middle ear effusion is present.  Left Ear: External ear normal. Tympanic membrane is not injected and not erythematous. A middle ear effusion is present.  Mouth/Throat: Oropharynx is clear and moist.  Neck: Normal range of motion. Neck supple. No thyromegaly present.  Cardiovascular: Normal rate, regular rhythm, normal heart sounds and  intact distal pulses.  Pulmonary/Chest: Effort normal.  Abdominal: Soft. Bowel sounds are normal. There is tenderness in the epigastric area, left upper quadrant and left lower quadrant. There is no rigidity and no guarding. No hernia.  Musculoskeletal: She exhibits no edema.  Neurological: She is alert and oriented to person, place, and time.  Psychiatric: She has a normal mood and affect. Her behavior is normal. Thought content normal.  Vitals reviewed.   Lab Results  Component Value Date   WBC 7.1 06/12/2018   HGB 13.7 06/12/2018   HCT 41.2 06/12/2018   PLT 196.0 06/12/2018   GLUCOSE 120 (H) 06/12/2018   CHOL 145 01/30/2018   TRIG 189.0 (H) 01/30/2018   HDL 38.40 (L) 01/30/2018   LDLCALC 69 01/30/2018   ALT 9  06/12/2018   AST 13 06/12/2018   NA 140 06/12/2018   K 4.8 06/12/2018   CL 103 06/12/2018   CREATININE 0.83 06/12/2018   BUN 13 06/12/2018   CO2 32 06/12/2018   TSH 0.71 01/30/2018   INR 1.02 08/17/2013   HGBA1C 6.1 06/12/2018    Assessment & Plan:   Deyanna was seen today for follow-up.  Diagnoses and all orders for this visit:  Essential hypertension -     Basic metabolic panel -     spironolactone (ALDACTONE) 25 MG tablet; Take 1 tablet (25 mg total) by mouth 2 (two) times daily. -     losartan (COZAAR) 100 MG tablet; Take 1 tablet (100 mg total) by mouth daily. -     metoprolol tartrate (LOPRESSOR) 100 MG tablet; Take 1 tablet (100 mg total) by mouth 2 (two) times daily.  Epigastric pain -     CBC w/Diff -     Lipase -     Amylase -     Hepatic function panel  Diarrhea, unspecified type -     Stool Culture; Future -     Cancel: C. difficile GDH and Toxin A/B; Future -     CBC w/Diff -     Basic metabolic panel -     Lipase -     Amylase -     Hepatic function panel -     C. difficile GDH and Toxin A/B; Future  Pre-diabetes -     Hemoglobin A1c  Need for influenza vaccination -     Flu vaccine HIGH DOSE PF  Nausea -     ondansetron (ZOFRAN)  4 MG tablet; Take 1 tablet (4 mg total) by mouth every 8 (eight) hours as needed for nausea or vomiting.  Mixed hyperlipidemia -     pravastatin (PRAVACHOL) 40 MG tablet; TAKE 1 TABLET BY MOUTH IN THE EVENING   I have discontinued Jeani Hawking E. Roberts's Fluticasone-Salmeterol, guaiFENesin-dextromethorphan, potassium chloride, and fluconazole. I have also changed her spironolactone. Additionally, I am having her start on ondansetron. Lastly, I am having her maintain her Calcium Carbonate-Vitamin D, vitamin E, ascorbic acid, aspirin EC, acetaminophen, nitroGLYCERIN, BIOTIN PO, magnesium chloride, pantoprazole, RESTASIS, FLOVENT HFA, hydrocortisone, vitamin B-12, Magnesium Oxide, EPINEPHrine, cetirizine, ipratropium, nystatin, ALPRAZolam, benzonatate, losartan, metoprolol tartrate, and pravastatin.  Meds ordered this encounter  Medications  . spironolactone (ALDACTONE) 25 MG tablet    Sig: Take 1 tablet (25 mg total) by mouth 2 (two) times daily.    Dispense:  180 tablet    Refill:  3    Order Specific Question:   Supervising Provider    Answer:   Lucille Passy [[id number]]  . ondansetron (ZOFRAN) 4 MG tablet    Sig: Take 1 tablet (4 mg total) by mouth every 8 (eight) hours as needed for nausea or vomiting.    Dispense:  20 tablet    Refill:  0    Order Specific Question:   Supervising Provider    Answer:   Lucille Passy [[id number]]  . losartan (COZAAR) 100 MG tablet    Sig: Take 1 tablet (100 mg total) by mouth daily.    Dispense:  90 tablet    Refill:  3    Order Specific Question:   Supervising Provider    Answer:   Lucille Passy [[id number]]  . metoprolol tartrate (LOPRESSOR) 100 MG tablet    Sig: Take 1 tablet (100 mg total) by mouth 2 (two) times daily.  Dispense:  180 tablet    Refill:  3    Order Specific Question:   Supervising Provider    Answer:   Lucille Passy [[id number]]  . pravastatin (PRAVACHOL) 40 MG tablet    Sig: TAKE 1 TABLET BY MOUTH IN THE EVENING    Dispense:  90 tablet    Refill:  3      Order Specific Question:   Supervising Provider    Answer:   Lucille Passy [[id number]]    Follow-up: Return in about 4 weeks (around 07/10/2018) for HTN.  Wilfred Lacy, NP

## 2018-06-12 NOTE — Patient Instructions (Addendum)
Please follow up with GI and allergist specialist about recurrent hoarseness and sorethroat.  Increase spironolactone to 25mg  BID. Stop potassium.  Go to lab for blood draw and stool collection stool.  Maintain adequate oral hydration Use water and gartorade for hydration.  May use peptobismol OTC for diarrhea and ABD pain.  Remember to rinse mouth after each use of flovent inhaler.  Diarrhea, Adult Diarrhea is frequent loose and watery bowel movements. Diarrhea can make you feel weak and cause you to become dehydrated. Dehydration can make you tired and thirsty, cause you to have a dry mouth, and decrease how often you urinate. Diarrhea typically lasts 2-3 days. However, it can last longer if it is a sign of something more serious. It is important to treat your diarrhea as told by your health care provider. Follow these instructions at home: Eating and drinking  Follow these recommendations as told by your health care provider:  Take an oral rehydration solution (ORS). This is a drink that is sold at pharmacies and retail stores.  Drink clear fluids, such as water, ice chips, diluted fruit juice, and low-calorie sports drinks.  Eat bland, easy-to-digest foods in small amounts as you are able. These foods include bananas, applesauce, rice, lean meats, toast, and crackers.  Avoid drinking fluids that contain a lot of sugar or caffeine, such as energy drinks, sports drinks, and soda.  Avoid alcohol.  Avoid spicy or fatty foods.  General instructions  Drink enough fluid to keep your urine clear or pale yellow.  Wash your hands often. If soap and water are not available, use hand sanitizer.  Make sure that all people in your household wash their hands well and often.  Take over-the-counter and prescription medicines only as told by your health care provider.  Rest at home while you recover.  Watch your condition for any changes.  Take a warm bath to relieve any burning or  pain from frequent diarrhea episodes.  Keep all follow-up visits as told by your health care provider. This is important. Contact a health care provider if:  You have a fever.  Your diarrhea gets worse.  You have new symptoms.  You cannot keep fluids down.  You feel light-headed or dizzy.  You have a headache  You have muscle cramps. Get help right away if:  You have chest pain.  You feel extremely weak or you faint.  You have bloody or black stools or stools that look like tar.  You have severe pain, cramping, or bloating in your abdomen.  You have trouble breathing or you are breathing very quickly.  Your heart is beating very quickly.  Your skin feels cold and clammy.  You feel confused.  You have signs of dehydration, such as: ? Dark urine, very little urine, or no urine. ? Cracked lips. ? Dry mouth. ? Sunken eyes. ? Sleepiness. ? Weakness. This information is not intended to replace advice given to you by your health care provider. Make sure you discuss any questions you have with your health care provider. Document Released: 08/19/2002 Document Revised: 01/07/2016 Document Reviewed: 05/05/2015 Elsevier Interactive Patient Education  [employer].

## 2018-06-12 NOTE — Assessment & Plan Note (Signed)
Uncontrolled. Use of losartan 100mg , metoprolol 100mg  BID, and spironolactone 25mg . BP Readings from Last 3 Encounters:  06/12/18 (!) 162/90  05/15/18 (!) 155/82  05/11/18 (!) 174/98  evaluated by cardiology: lexiscan and event monitor done (no abnormal finding). No change in medication regimen by cardiology. Negative for adrenal gland tumor. Sleep study repeated 02/2018 by neurology: mild OSA, autopap recommended.  Repeat BMP. Increase spironolactone 25mg  BID. F/up in 30month

## 2018-06-13 ENCOUNTER — Other Ambulatory Visit: Payer: Medicare HMO

## 2018-06-13 DIAGNOSIS — R197 Diarrhea, unspecified: Secondary | ICD-10-CM

## 2018-06-15 ENCOUNTER — Ambulatory Visit: Payer: Self-pay | Admitting: Hematology and Oncology

## 2018-06-15 ENCOUNTER — Ambulatory Visit
Admission: RE | Admit: 2018-06-15 | Discharge: 2018-06-15 | Disposition: A | Discharge status: Home / Self Care | Payer: Medicare HMO | Source: Ambulatory Visit | Attending: Nurse Practitioner | Admitting: Nurse Practitioner

## 2018-06-15 DIAGNOSIS — Z1231 Encounter for screening mammogram for malignant neoplasm of breast: Secondary | ICD-10-CM

## 2018-06-17 LAB — STOOL CULTURE
MICRO NUMBER: 91185646
MICRO NUMBER:: 91185644
MICRO NUMBER:: 91185645
SHIGA RESULT: NOT DETECTED
SPECIMEN QUALITY: ADEQUATE
SPECIMEN QUALITY:: ADEQUATE
SPECIMEN QUALITY:: ADEQUATE

## 2018-06-17 LAB — C. DIFFICILE GDH AND TOXIN A/B
GDH ANTIGEN: NOT DETECTED
MICRO NUMBER:: 91183859
SPECIMEN QUALITY:: ADEQUATE
TOXIN A AND B: NOT DETECTED

## 2018-06-18 ENCOUNTER — Telehealth: Payer: Self-pay | Admitting: Hematology and Oncology

## 2018-06-18 ENCOUNTER — Inpatient Hospital Stay: Payer: Medicare HMO | Attending: Hematology and Oncology | Admitting: Hematology and Oncology

## 2018-06-18 DIAGNOSIS — Z9221 Personal history of antineoplastic chemotherapy: Secondary | ICD-10-CM

## 2018-06-18 DIAGNOSIS — Z7982 Long term (current) use of aspirin: Secondary | ICD-10-CM | POA: Diagnosis not present

## 2018-06-18 DIAGNOSIS — I10 Essential (primary) hypertension: Secondary | ICD-10-CM

## 2018-06-18 DIAGNOSIS — Z79899 Other long term (current) drug therapy: Secondary | ICD-10-CM | POA: Diagnosis not present

## 2018-06-18 DIAGNOSIS — Z923 Personal history of irradiation: Secondary | ICD-10-CM | POA: Diagnosis not present

## 2018-06-18 DIAGNOSIS — F419 Anxiety disorder, unspecified: Secondary | ICD-10-CM | POA: Diagnosis not present

## 2018-06-18 DIAGNOSIS — Z170 Estrogen receptor positive status [ER+]: Secondary | ICD-10-CM

## 2018-06-18 DIAGNOSIS — C50211 Malignant neoplasm of upper-inner quadrant of right female breast: Secondary | ICD-10-CM

## 2018-06-18 DIAGNOSIS — Z171 Estrogen receptor negative status [ER-]: Secondary | ICD-10-CM | POA: Diagnosis not present

## 2018-06-18 DIAGNOSIS — Z853 Personal history of malignant neoplasm of breast: Secondary | ICD-10-CM

## 2018-06-18 MED ORDER — SPIRONOLACTONE 25 MG PO TABS: 50.0000 mg | ORAL_TABLET | Freq: Every day | ORAL | 3 refills | Status: DC

## 2018-06-18 NOTE — Progress Notes (Signed)
Patient Care Team: Nche, Charlene Brooke, NP as PCP - General (Internal Medicine)  DIAGNOSIS:  Encounter Diagnoses  Name Primary?  . Essential hypertension   . Malignant neoplasm of upper-inner quadrant of right breast in female, estrogen receptor positive ([place])     SUMMARY OF ONCOLOGIC HISTORY:   Breast cancer of upper-inner quadrant of right female breast ([place])   08/29/2007 Mammogram    Right breast mass macrolobulated 1.5 x 1.2 x 1.8 cm and 1.2 x 1.0 x 1.7 cm biopsy of lateral mass IDC ER 1% PR 0% HER-2 negative Ki-67 38%, MRI bilobed mass 4.9 x 2.6 x 4.8 cm    09/18/2007 - 03/03/2008 Neo-Adjuvant Chemotherapy    FEC x4 followed by dose dense Taxotere with Xeloda 1000 mg by mouth twice a day for 8 weeks (could not tolerate increased dose of Xeloda to 1500 mg), MRI showed decreased mass 3.4 x 2.6 x 3 cm    05/06/2008 Surgery    Right breast lumpectomy 3.8 cm tumor SLN negative T2, N0, M0 stage II A. pathologic staging    06/03/2008 - 07/22/2008 Radiation Therapy    Radiation therapy to lumpectomy site    06/05/2017 - 06/07/2017 Hospital Admission    Hospitalization for generalized anxiety disorder     CHIEF COMPLIANT: Follow-up in surveillance of history of breast cancer  INTERVAL HISTORY: [name] is a 67 year old with above-mentioned history of right breast cancer treated with neoadjuvant chemotherapy followed by lumpectomy and radiation is currently on surveillance.  She denies any lumps or nodules in the breast.  She has had problems with controlling her blood pressure.  With the increase in spironolactone she is feeling more drowsy.  In spite of this her blood pressure remains the same.  REVIEW OF SYSTEMS:   Constitutional: Denies fevers, chills or abnormal weight loss Eyes: Denies blurriness of vision Ears, nose, mouth, throat, and face: Denies mucositis or sore throat Respiratory: Denies cough, dyspnea or wheezes Cardiovascular: Denies palpitation, chest  discomfort Gastrointestinal:  Denies nausea, heartburn or change in bowel habits Skin: Denies abnormal skin rashes Lymphatics: Denies new lymphadenopathy or easy bruising Neurological:Denies numbness, tingling or new weaknesses Behavioral/Psych: Anxiety disorder Extremities: No lower extremity edema Breast:  denies any pain or lumps or nodules in either breasts All other systems were reviewed with the patient and are negative.  I have reviewed the past medical history, past surgical history, social history and family history with the patient and they are unchanged from previous note.  ALLERGIES:  is allergic to amoxicillin; azithromycin; bromfed; cephalexin; chlordiazepoxide-clidinium; claritin [loratadine]; clotrimazole; gabapentin; gatifloxacin; ibuprofen; iohexol; lidocaine; lisinopril; other; paroxetine; penicillins; prednisone; pregabalin; propoxyphene n-acetaminophen; sertraline hcl; sulfa antibiotics; sulfadiazine; verapamil; adhesive [tape]; clonazepam; effexor [venlafaxine]; escitalopram; ibuprofen; latex; sertraline; tussionex pennkinetic er [hydrocod polst-cpm polst er]; paroxetine hcl; chlordiazepoxide-clidinium; dicyclomine hcl; hydralazine hcl; pseudoephedrine; and valium [diazepam].  MEDICATIONS:  Current Outpatient Medications  Medication Sig Dispense Refill  . acetaminophen (TYLENOL) 500 MG tablet Take 500-1,000 mg by mouth every 6 (six) hours as needed for moderate pain.     Marland Kitchen ALPRAZolam (XANAX) 1 MG tablet Take 0.5 tablets (0.5 mg total) by mouth 3 (three) times daily as needed for anxiety. 45 tablet 2  . ascorbic acid (VITAMIN C) 1000 MG tablet Take 1,000 mg by mouth daily.     Marland Kitchen aspirin EC 81 MG tablet Take 81 mg by mouth daily.    . benzonatate (TESSALON) 100 MG capsule Take 1 capsule (100 mg total) by mouth 3 (three) times daily  as needed for cough. 60 capsule 1  . BIOTIN PO Take 1 tablet by mouth every morning. Hair Skin and Nails    . Calcium Carbonate-Vitamin D  (CALCIUM-CARB 600 + D) 600-125 MG-UNIT TABS Take 1 tablet by mouth daily.     . cetirizine (ZYRTEC) 10 MG tablet Take 1 tablet (10 mg total) by mouth daily. 14 tablet 0  . EPINEPHrine (EPIPEN 2-PAK) 0.3 mg/0.3 mL IJ SOAJ injection Inject into the muscle.    Marland Kitchen FLOVENT HFA 110 MCG/ACT inhaler Inhale 2 puffs into the lungs 2 (two) times daily.     . hydrocortisone (ANUSOL-HC) 25 MG suppository Place 1 suppository (25 mg total) rectally 2 (two) times daily. 12 suppository 0  . ipratropium (ATROVENT) 0.03 % nasal spray Place 1 spray into both nostrils 2 (two) times daily. 30 mL 1  . losartan (COZAAR) 100 MG tablet Take 1 tablet (100 mg total) by mouth daily. 90 tablet 3  . magnesium chloride (SLOW-MAG) 64 MG TBEC SR tablet Take 2 tablets (128 mg total) by mouth daily. 15 tablet 0  . Magnesium Oxide 400 (240 Mg) MG TABS Take by mouth.    . metoprolol tartrate (LOPRESSOR) 100 MG tablet Take 1 tablet (100 mg total) by mouth 2 (two) times daily. 180 tablet 3  . nitroGLYCERIN (NITROSTAT) 0.4 MG SL tablet Place 0.4 mg under the tongue every 5 (five) minutes as needed for chest pain.     Marland Kitchen nystatin (MYCOSTATIN) 100000 UNIT/ML suspension Take 5 mLs (500,000 Units total) by mouth 4 (four) times daily. 200 mL 0  . ondansetron (ZOFRAN) 4 MG tablet Take 1 tablet (4 mg total) by mouth every 8 (eight) hours as needed for nausea or vomiting. 20 tablet 0  . pantoprazole (PROTONIX) 40 MG tablet Take 1 tablet (40 mg total) by mouth daily. 90 tablet 3  . pravastatin (PRAVACHOL) 40 MG tablet TAKE 1 TABLET BY MOUTH IN THE EVENING 90 tablet 3  . RESTASIS 0.05 % ophthalmic emulsion Place 1 drop into both eyes 2 (two) times daily.  3  . spironolactone (ALDACTONE) 25 MG tablet Take 2 tablets (50 mg total) by mouth daily. 180 tablet 3  . vitamin B-12 (CYANOCOBALAMIN) 1000 MCG tablet Take 1 tablet (1,000 mcg total) by mouth daily. 30 tablet 1  . vitamin E (VITAMIN E) 400 UNIT capsule Take 400 Units by mouth daily.     No  current facility-administered medications for this visit.     PHYSICAL EXAMINATION: ECOG PERFORMANCE STATUS: 1 - Symptomatic but completely ambulatory  Vitals:   06/18/18 1127  BP: (!) 162/85  Pulse: 69  Resp: 17  Temp: 98.6 F (37 C)  SpO2: 99%   Filed Weights   06/18/18 1127  Weight: 166 lb 1.6 oz (75.3 kg)    GENERAL:alert, no distress and comfortable SKIN: skin color, texture, turgor are normal, no rashes or significant lesions EYES: normal, Conjunctiva are pink and non-injected, sclera clear OROPHARYNX:no exudate, no erythema and lips, buccal mucosa, and tongue normal  NECK: supple, thyroid normal size, non-tender, without nodularity LYMPH:  no palpable lymphadenopathy in the cervical, axillary or inguinal LUNGS: clear to auscultation and percussion with normal breathing effort HEART: regular rate & rhythm and no murmurs and no lower extremity edema ABDOMEN:abdomen soft, non-tender and normal bowel sounds MUSCULOSKELETAL:no cyanosis of digits and no clubbing  NEURO: alert & oriented x 3 with fluent speech, no focal motor/sensory deficits EXTREMITIES: No lower extremity edema BREAST: No palpable masses or nodules in either  right or left breasts. No palpable axillary supraclavicular or infraclavicular adenopathy no breast tenderness or nipple discharge. (exam performed in the presence of a chaperone)  LABORATORY DATA:  I have reviewed the data as listed CMP Latest Ref Rng & Units 06/12/2018 04/25/2018 03/26/2018  Glucose 70 - 99 mg/dL 120(H) 97 97  BUN 6 - 23 mg/dL 13 18 15   Creatinine 0.40 - 1.20 mg/dL 0.83 0.85 0.81  Sodium 135 - 145 mEq/L 140 141 141  Potassium 3.5 - 5.1 mEq/L 4.8 4.5 4.5  Chloride 96 - 112 mEq/L 103 103 104  CO2 19 - 32 mEq/L 32 26 26  Calcium 8.4 - 10.5 mg/dL 10.7(H) 10.2 10.2  Total Protein 6.0 - 8.3 g/dL 7.1 - -  Total Bilirubin 0.2 - 1.2 mg/dL 1.2 - -  Alkaline Phos 39 - 117 U/L 55 - -  AST 0 - 37 U/L 13 - -  ALT 0 - 35 U/L 9 - -    Lab  Results  Component Value Date   WBC 7.1 06/12/2018   HGB 13.7 06/12/2018   HCT 41.2 06/12/2018   MCV 91.0 06/12/2018   PLT 196.0 06/12/2018   NEUTROABS 5.2 06/12/2018    ASSESSMENT & PLAN:  Breast cancer of upper-inner quadrant of right female breast ([place]) Right breast cancer diagnosed 08/29/2007 T2, N0, M0 stage II A. pathologic staging after neoadjuvant chemotherapy completed 03/03/2008 for ER/PR negative HER-2 negative breast cancer treated with lumpectomy 05/06/2008 followed by radiation which was completed 07/22/2008  Breast Cancer Surveillance: 1. Breast exam  06/18/2018: Normal, and redness near the radiation area 2. Mammogram  October 2019 No abnormalities. Postsurgical changes. Breast Density Category C.   Severe anxiety disorder: Under medications and going to see a psychiatrist. Return to clinic in 1 to follow-up with long-term survivorship clinic    No orders of the defined types were placed in this encounter.  The patient has a good understanding of the overall plan. she agrees with it. she will call with any problems that may develop before the next visit here.   Harriette Ohara, MD 06/18/18

## 2018-06-18 NOTE — Telephone Encounter (Signed)
Gave parient avs and calendar.

## 2018-06-18 NOTE — Assessment & Plan Note (Signed)
Right breast cancer diagnosed 08/29/2007 T2, N0, M0 stage II A. pathologic staging after neoadjuvant chemotherapy completed 03/03/2008 for ER/PR negative HER-2 negative breast cancer treated with lumpectomy 05/06/2008 followed by radiation which was completed 07/22/2008  Breast Cancer Surveillance: 1. Breast exam  06/18/2018: Normal, and redness near the radiation area 2. Mammogram  October 2019 No abnormalities. Postsurgical changes. Breast Density Category C.   Severe anxiety disorder: Under medications and going to see a psychiatrist. Return to clinic in 1 to follow-up with long-term survivorship clinic

## 2018-06-19 ENCOUNTER — Telehealth: Payer: Self-pay | Admitting: Nurse Practitioner

## 2018-06-19 NOTE — Telephone Encounter (Signed)
Pt called back stating she was able to get BP read it is 165/85-pt denies any symptoms

## 2018-06-19 NOTE — Telephone Encounter (Signed)
Pt called to report that Spironolactone is making her fatigue,unable to functions with her ADLs and nervousness. Please advise.

## 2018-06-19 NOTE — Telephone Encounter (Signed)
She should maintain spironolactone at 25mg  once a day.  Monitor BP once a day and record. Bring readings to next appt. Maintain upcoming appt with me.

## 2018-06-19 NOTE — Telephone Encounter (Signed)
Please have her check BP today and report. Decrease medication to 25mg  once a day

## 2018-06-19 NOTE — Telephone Encounter (Signed)
Pt stated she was on 1 daily and these symptoms presented, getting worse when increase the medication.   Pt will call back with BP reading.

## 2018-06-19 NOTE — Telephone Encounter (Signed)
Unable to leave vm, need to inform the pt [name] message below. [place] for triage nurse give detail message.

## 2018-06-20 NOTE — Telephone Encounter (Signed)
Pt is aware.  

## 2018-06-29 DIAGNOSIS — G4733 Obstructive sleep apnea (adult) (pediatric): Secondary | ICD-10-CM | POA: Diagnosis not present

## 2018-07-11 ENCOUNTER — Ambulatory Visit (INDEPENDENT_AMBULATORY_CARE_PROVIDER_SITE_OTHER): Payer: Medicare HMO | Admitting: Nurse Practitioner

## 2018-07-11 ENCOUNTER — Encounter: Payer: Self-pay | Admitting: Nurse Practitioner

## 2018-07-11 VITALS — BP 160/90 | HR 68 | Temp 98.2°F | Wt 167.4 lb

## 2018-07-11 DIAGNOSIS — R300 Dysuria: Secondary | ICD-10-CM | POA: Diagnosis not present

## 2018-07-11 DIAGNOSIS — I10 Essential (primary) hypertension: Secondary | ICD-10-CM

## 2018-07-11 DIAGNOSIS — N3000 Acute cystitis without hematuria: Secondary | ICD-10-CM | POA: Diagnosis not present

## 2018-07-11 MED ORDER — HYDRALAZINE HCL 25 MG PO TABS: 25.0000 mg | ORAL_TABLET | Freq: Two times a day (BID) | ORAL | 1 refills | Status: DC

## 2018-07-11 NOTE — Patient Instructions (Signed)
Go to lab for urine collection.  Maintain other medications Start hydralazine 1tab every 12hrs.  Talk to neurology about adjusting CPAP setting and/or changing face mask.  Hydralazine tablets What is this medicine? HYDRALAZINE (hye DRAL a zeen) is a type of vasodilator. It relaxes blood vessels, increasing the blood and oxygen supply to your heart. This medicine is used to treat high blood pressure. This medicine may be used for other purposes; ask your health care provider or pharmacist if you have questions. COMMON BRAND NAME(S): Apresoline What should I tell my health care provider before I take this medicine? They need to know if you have any of these conditions: -blood vessel disease -heart disease including angina or history of heart attack -kidney or liver disease -systemic lupus erythematosus (SLE) -an unusual or allergic reaction to hydralazine, tartrazine dye, other medicines, foods, dyes, or preservatives -pregnant or trying to get pregnant -breast-feeding How should I use this medicine? Take this medicine by mouth with a glass of water. Follow the directions on the prescription label. Take your doses at regular intervals. Do not take your medicine more often than directed. Do not stop taking except on the advice of your doctor or health care professional. Talk to your pediatrician regarding the use of this medicine in children. Special care may be needed. While this drug may be prescribed for children for selected conditions, precautions do apply. Overdosage: If you think you have taken too much of this medicine contact a poison control center or emergency room at once. NOTE: This medicine is only for you. Do not share this medicine with others. What if I miss a dose? If you miss a dose, take it as soon as you can. If it is almost time for your next dose, take only that dose. Do not take double or extra doses. What may interact with this medicine? -medicines for high blood  pressure -medicines for mental depression This list may not describe all possible interactions. Give your health care provider a list of all the medicines, herbs, non-prescription drugs, or dietary supplements you use. Also tell them if you smoke, drink alcohol, or use illegal drugs. Some items may interact with your medicine. What should I watch for while using this medicine? Visit your doctor or health care professional for regular checks on your progress. Check your blood pressure and pulse rate regularly. Ask your doctor or health care professional what your blood pressure and pulse rate should be and when you should contact him or her. You may get drowsy or dizzy. Do not drive, use machinery, or do anything that needs mental alertness until you know how this medicine affects you. Do not stand or sit up quickly, especially if you are an older patient. This reduces the risk of dizzy or fainting spells. Alcohol may interfere with the effect of this medicine. Avoid alcoholic drinks. Do not treat yourself for coughs, colds, or pain while you are taking this medicine without asking your doctor or health care professional for advice. Some ingredients may increase your blood pressure. What side effects may I notice from receiving this medicine? Side effects that you should report to your doctor or health care professional as soon as possible: -chest pain, or fast or irregular heartbeat -fever, chills, or sore throat -numbness or tingling in the hands or feet -shortness of breath -skin rash, redness, blisters or itching -stiff or swollen joints -sudden weight gain -swelling of the feet or legs -swollen lymph glands -unusual weakness Side effects that usually  do not require medical attention (report to your doctor or health care professional if they continue or are bothersome): -diarrhea, or constipation -headache -loss of appetite -nausea, vomiting This list may not describe all possible side  effects. Call your doctor for medical advice about side effects. You may report side effects to FDA at 1-800-FDA-1088. Where should I keep my medicine? Keep out of the reach of children. Store at room temperature between 15 and 30 degrees C (59 and 86 degrees F). Throw away any unused medicine after the expiration date. NOTE: This sheet is a summary. It may not cover all possible information. If you have questions about this medicine, talk to your doctor, pharmacist, or health care provider.  2018 Elsevier/Gold Standard (2008-01-11 15:44:58)

## 2018-07-11 NOTE — Progress Notes (Addendum)
Subjective:  Patient ID: [name], female    DOB: 1951/07/26  Age: 67 y.o. MRN: [id number]  CC: Hypertension  Dysuria   This is a new problem. The current episode started in the past 7 days. The problem occurs intermittently. The problem has been unchanged. The quality of the pain is described as burning and aching. The patient is experiencing no pain. There has been no fever. She is not sexually active. There is no history of pyelonephritis. Associated symptoms include hematuria. Pertinent negatives include no chills, discharge, flank pain, frequency, hesitancy, nausea, possible pregnancy, sweats, urgency or vomiting.   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  HTN: Resistant. current use of metoprolol, losartan and spironolactone. Spironolactone increased to 25mg  BID 64month ago Reports she was Unable to tolerate increased dose of spironolactone after 3doses. She decreased dose to 25mg  and Symptoms resolved. He is unable to remember allergic reaction to hydralazine. She is willing to try medication again. She is not using CPAP as recommended. Has not f/up with sleep center.  Reviewed past Medical, Social and Family history today.  No facility-administered medications prior to visit.    Outpatient Medications Prior to Visit  Medication Sig Dispense Refill  . acetaminophen (TYLENOL) 500 MG tablet Take 500-1,000 mg by mouth every 6 (six) hours as needed for moderate pain.     Marland Kitchen ALPRAZolam (XANAX) 1 MG tablet Take 0.5 tablets (0.5 mg total) by mouth 3 (three) times daily as needed for anxiety. 45 tablet 2  . ascorbic acid (VITAMIN C) 1000 MG tablet Take 1,000 mg by mouth daily.     Marland Kitchen aspirin EC 81 MG tablet Take 81 mg by mouth daily.    Marland Kitchen BIOTIN PO Take 1 tablet by mouth every morning. Hair Skin and Nails    . Calcium Carbonate-Vitamin D (CALCIUM-CARB 600 + D) 600-125 MG-UNIT TABS Take 1 tablet by mouth daily.     . cetirizine (ZYRTEC) 10 MG tablet Take 1  tablet (10 mg total) by mouth daily. 14 tablet 0  . EPINEPHrine (EPIPEN 2-PAK) 0.3 mg/0.3 mL IJ SOAJ injection Inject into the muscle.    Marland Kitchen FLOVENT HFA 110 MCG/ACT inhaler Inhale 2 puffs into the lungs 2 (two) times daily.     . hydrocortisone (ANUSOL-HC) 25 MG suppository Place 1 suppository (25 mg total) rectally 2 (two) times daily. (Patient not taking: Reported on 07/12/2018) 12 suppository 0  . ipratropium (ATROVENT) 0.03 % nasal spray Place 1 spray into both nostrils 2 (two) times daily. (Patient taking differently: Place 1 spray into both nostrils 2 (two) times daily as needed (allergies). ) 30 mL 1  . losartan (COZAAR) 100 MG tablet Take 1 tablet (100 mg total) by mouth daily. 90 tablet 3  . magnesium chloride (SLOW-MAG) 64 MG TBEC SR tablet Take 2 tablets (128 mg total) by mouth daily. (Patient not taking: Reported on 07/12/2018) 15 tablet 0  . Magnesium Oxide 400 (240 Mg) MG TABS Take 1 tablet by mouth daily.     . metoprolol tartrate (LOPRESSOR) 100 MG tablet Take 1 tablet (100 mg total) by mouth 2 (two) times daily. 180 tablet 3  . nitroGLYCERIN (NITROSTAT) 0.4 MG SL tablet Place 0.4 mg under the tongue every 5 (five) minutes as needed for chest pain.     Marland Kitchen nystatin (MYCOSTATIN) 100000 UNIT/ML suspension Take 5 mLs (500,000 Units total) by mouth 4 (four) times daily. 200 mL 0  . ondansetron (ZOFRAN) 4 MG tablet Take 1 tablet (  4 mg total) by mouth every 8 (eight) hours as needed for nausea or vomiting. 20 tablet 0  . pantoprazole (PROTONIX) 40 MG tablet Take 1 tablet (40 mg total) by mouth daily. 90 tablet 3  . pravastatin (PRAVACHOL) 40 MG tablet TAKE 1 TABLET BY MOUTH IN THE EVENING 90 tablet 3  . RESTASIS 0.05 % ophthalmic emulsion Place 1 drop into both eyes 2 (two) times daily.  3  . spironolactone (ALDACTONE) 25 MG tablet Take 2 tablets (50 mg total) by mouth daily. 180 tablet 3  . vitamin B-12 (CYANOCOBALAMIN) 1000 MCG tablet Take 1 tablet (1,000 mcg total) by mouth daily. 30 tablet  1  . vitamin E (VITAMIN E) 400 UNIT capsule Take 400 Units by mouth daily.    . valACYclovir (VALTREX) 500 MG tablet     . benzonatate (TESSALON) 100 MG capsule Take 1 capsule (100 mg total) by mouth 3 (three) times daily as needed for cough. (Patient not taking: Reported on 07/11/2018) 60 capsule 1    ROS See HPI  Objective:  BP (!) 160/90 (BP Location: Right Arm, Patient Position: Sitting, Cuff Size: Normal)   Pulse 68   Temp 98.2 F (36.8 C) (Oral)   Wt 167 lb 6.4 oz (75.9 kg)   SpO2 98%   BMI 29.65 kg/m   BP Readings from Last 3 Encounters:  07/13/18 (!) 163/96  07/11/18 (!) 160/90  06/18/18 (!) 162/85    Wt Readings from Last 3 Encounters:  07/13/18 166 lb 3.6 oz (75.4 kg)  07/11/18 167 lb 6.4 oz (75.9 kg)  06/18/18 166 lb 1.6 oz (75.3 kg)    Physical Exam  Constitutional: She is oriented to person, place, and time. She appears well-developed and well-nourished.  HENT:  Right Ear: Tympanic membrane, external ear and ear canal normal.  Left Ear: Tympanic membrane, external ear and ear canal normal.  Mouth/Throat: Oropharynx is clear and moist.  Neck: No JVD present. No thyromegaly present.  Cardiovascular: Normal rate and regular rhythm.  Pulmonary/Chest: Effort normal and breath sounds normal.  Musculoskeletal: She exhibits no edema.  Lymphadenopathy:    She has no cervical adenopathy.  Neurological: She is alert and oriented to person, place, and time.  Psychiatric: She has a normal mood and affect. Her behavior is normal. Thought content normal.  Vitals reviewed.   Lab Results  Component Value Date   WBC 4.9 07/13/2018   HGB 11.7 (L) 07/13/2018   HCT 36.8 07/13/2018   PLT 181 07/13/2018   GLUCOSE 129 (H) 07/13/2018   CHOL 141 07/13/2018   TRIG 96 07/13/2018   HDL 38 (L) 07/13/2018   LDLCALC 84 07/13/2018   ALT 9 06/12/2018   AST 13 06/12/2018   NA 143 07/13/2018   K 4.2 07/13/2018   CL 110 07/13/2018   CREATININE 0.88 07/13/2018   BUN 15  07/13/2018   CO2 25 07/13/2018   TSH 1.196 07/12/2018   INR 1.02 08/17/2013   HGBA1C 5.8 (H) 07/13/2018    Mm 3d Screen Breast Bilateral  Result Date: 06/15/2018 CLINICAL DATA:  Screening. EXAM: DIGITAL SCREENING BILATERAL MAMMOGRAM WITH TOMO AND CAD COMPARISON:  Previous exam(s). ACR Breast Density Category b: There are scattered areas of fibroglandular density. FINDINGS: There are no findings suspicious for malignancy. Stable post lumpectomy changes on the right. Images were processed with CAD. IMPRESSION: No mammographic evidence of malignancy. A result letter of this screening mammogram will be mailed directly to the patient. RECOMMENDATION: Screening mammogram in one year. (Code:SM-B-01Y)  BI-RADS CATEGORY  2: Benign. Electronically Signed   By: Lajean Manes M.D.   On: 06/15/2018 11:07    Assessment & Plan:   Tola was seen today for hypertension.  Diagnoses and all orders for this visit:  Essential hypertension -     hydrALAZINE (APRESOLINE) 25 MG tablet; Take 1 tablet (25 mg total) by mouth 2 (two) times daily.  Acute cystitis without hematuria -     Urinalysis w microscopic + reflex cultur -     REFLEXIVE URINE CULTURE -     Urine Culture -     doxycycline (VIBRA-TABS) 100 MG tablet; Take 1 tablet (100 mg total) by mouth 2 (two) times daily for 7 days.   I am having Mitchel Honour start on hydrALAZINE and doxycycline. I am also having her maintain her Calcium Carbonate-Vitamin D, vitamin E, ascorbic acid, aspirin EC, acetaminophen, nitroGLYCERIN, BIOTIN PO, magnesium chloride, pantoprazole, RESTASIS, FLOVENT HFA, hydrocortisone, vitamin B-12, Magnesium Oxide, EPINEPHrine, cetirizine, ipratropium, nystatin, ALPRAZolam, benzonatate, ondansetron, losartan, metoprolol tartrate, pravastatin, and spironolactone.  Meds ordered this encounter  Medications  . hydrALAZINE (APRESOLINE) 25 MG tablet    Sig: Take 1 tablet (25 mg total) by mouth 2 (two) times daily.    Dispense:  60  tablet    Refill:  1    Order Specific Question:   Supervising Provider    Answer:   Lucille Passy [[id number]]  . doxycycline (VIBRA-TABS) 100 MG tablet    Sig: Take 1 tablet (100 mg total) by mouth 2 (two) times daily for 7 days.    Dispense:  14 tablet    Refill:  0    Order Specific Question:   Supervising Provider    Answer:   MATTHEWS, CODY [[id number]]    Follow-up: Return in about 4 weeks (around 08/08/2018) for HTN (28mns slot).  Wilfred Lacy, NP

## 2018-07-11 NOTE — Assessment & Plan Note (Addendum)
Resistant. current use of metoprolol 100mg  BID, losartan 100mg  and spironolactone 25mg . Spironolactone increased to 25mg  BID 24month ago Reports she was Unable to tolerate increased dose of spironolactone after 3doses. She decreased dose to 25mg  and Symptoms resolved. He is unable to remember allergic reaction to hydralazine. 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 She is not using CPAP as recommended. Has not f/up with sleep center.  Maintain spironolactone, metoprolol and losartan. Will try hydralazine 25mg  BID again. She is to call office if develops itching or rash. Advised to contact sleep center for CPAP to be adjusted. F/up in 83month.

## 2018-07-12 ENCOUNTER — Encounter (HOSPITAL_COMMUNITY): Payer: Self-pay | Admitting: Emergency Medicine

## 2018-07-12 ENCOUNTER — Ambulatory Visit: Payer: Self-pay

## 2018-07-12 ENCOUNTER — Emergency Department (HOSPITAL_COMMUNITY): Payer: Medicare HMO

## 2018-07-12 ENCOUNTER — Observation Stay (HOSPITAL_COMMUNITY)
Admission: EM | Admit: 2018-07-12 | Discharge: 2018-07-14 | Disposition: A | Discharge status: Home / Self Care | Payer: Medicare HMO | Attending: Internal Medicine | Admitting: Internal Medicine

## 2018-07-12 DIAGNOSIS — Z9221 Personal history of antineoplastic chemotherapy: Secondary | ICD-10-CM | POA: Insufficient documentation

## 2018-07-12 DIAGNOSIS — N182 Chronic kidney disease, stage 2 (mild): Secondary | ICD-10-CM | POA: Diagnosis not present

## 2018-07-12 DIAGNOSIS — J45909 Unspecified asthma, uncomplicated: Secondary | ICD-10-CM | POA: Diagnosis not present

## 2018-07-12 DIAGNOSIS — I5032 Chronic diastolic (congestive) heart failure: Secondary | ICD-10-CM | POA: Diagnosis present

## 2018-07-12 DIAGNOSIS — Z7982 Long term (current) use of aspirin: Secondary | ICD-10-CM | POA: Diagnosis not present

## 2018-07-12 DIAGNOSIS — R7989 Other specified abnormal findings of blood chemistry: Secondary | ICD-10-CM | POA: Diagnosis present

## 2018-07-12 DIAGNOSIS — F411 Generalized anxiety disorder: Secondary | ICD-10-CM | POA: Insufficient documentation

## 2018-07-12 DIAGNOSIS — F329 Major depressive disorder, single episode, unspecified: Secondary | ICD-10-CM | POA: Insufficient documentation

## 2018-07-12 DIAGNOSIS — Z87891 Personal history of nicotine dependence: Secondary | ICD-10-CM | POA: Diagnosis not present

## 2018-07-12 DIAGNOSIS — I160 Hypertensive urgency: Secondary | ICD-10-CM | POA: Diagnosis not present

## 2018-07-12 DIAGNOSIS — E785 Hyperlipidemia, unspecified: Secondary | ICD-10-CM | POA: Diagnosis present

## 2018-07-12 DIAGNOSIS — Z923 Personal history of irradiation: Secondary | ICD-10-CM | POA: Insufficient documentation

## 2018-07-12 DIAGNOSIS — N179 Acute kidney failure, unspecified: Secondary | ICD-10-CM | POA: Diagnosis present

## 2018-07-12 DIAGNOSIS — I2510 Atherosclerotic heart disease of native coronary artery without angina pectoris: Secondary | ICD-10-CM | POA: Diagnosis present

## 2018-07-12 DIAGNOSIS — I639 Cerebral infarction, unspecified: Secondary | ICD-10-CM | POA: Diagnosis present

## 2018-07-12 DIAGNOSIS — R079 Chest pain, unspecified: Secondary | ICD-10-CM

## 2018-07-12 DIAGNOSIS — Z853 Personal history of malignant neoplasm of breast: Secondary | ICD-10-CM | POA: Insufficient documentation

## 2018-07-12 DIAGNOSIS — F410 Panic disorder [episodic paroxysmal anxiety] without agoraphobia: Secondary | ICD-10-CM | POA: Diagnosis present

## 2018-07-12 DIAGNOSIS — R0789 Other chest pain: Principal | ICD-10-CM | POA: Diagnosis present

## 2018-07-12 DIAGNOSIS — R000 Tachycardia, unspecified: Secondary | ICD-10-CM

## 2018-07-12 DIAGNOSIS — Z8673 Personal history of transient ischemic attack (TIA), and cerebral infarction without residual deficits: Secondary | ICD-10-CM | POA: Insufficient documentation

## 2018-07-12 DIAGNOSIS — I130 Hypertensive heart and chronic kidney disease with heart failure and stage 1 through stage 4 chronic kidney disease, or unspecified chronic kidney disease: Secondary | ICD-10-CM | POA: Insufficient documentation

## 2018-07-12 DIAGNOSIS — E876 Hypokalemia: Secondary | ICD-10-CM | POA: Diagnosis present

## 2018-07-12 DIAGNOSIS — Z79899 Other long term (current) drug therapy: Secondary | ICD-10-CM | POA: Diagnosis not present

## 2018-07-12 DIAGNOSIS — K219 Gastro-esophageal reflux disease without esophagitis: Secondary | ICD-10-CM | POA: Diagnosis present

## 2018-07-12 DIAGNOSIS — R7303 Prediabetes: Secondary | ICD-10-CM | POA: Insufficient documentation

## 2018-07-12 DIAGNOSIS — I10 Essential (primary) hypertension: Secondary | ICD-10-CM | POA: Diagnosis not present

## 2018-07-12 DIAGNOSIS — R778 Other specified abnormalities of plasma proteins: Secondary | ICD-10-CM | POA: Diagnosis present

## 2018-07-12 DIAGNOSIS — G4730 Sleep apnea, unspecified: Secondary | ICD-10-CM | POA: Insufficient documentation

## 2018-07-12 LAB — TSH: TSH: 1.196 u[IU]/mL (ref 0.350–4.500)

## 2018-07-12 LAB — BASIC METABOLIC PANEL
ANION GAP: 9 (ref 5–15)
BUN: 19 mg/dL (ref 8–23)
CALCIUM: 10.2 mg/dL (ref 8.9–10.3)
CHLORIDE: 104 mmol/L (ref 98–111)
CO2: 26 mmol/L (ref 22–32)
Creatinine, Ser: 1.3 mg/dL — ABNORMAL HIGH (ref 0.44–1.00)
GFR calc Af Amer: 49 mL/min — ABNORMAL LOW (ref >60)
GFR calc non Af Amer: 42 mL/min — ABNORMAL LOW (ref >60)
GLUCOSE: 178 mg/dL — AB (ref 70–99)
Potassium: 3.2 mmol/L — ABNORMAL LOW (ref 3.5–5.1)
Sodium: 139 mmol/L (ref 135–145)

## 2018-07-12 LAB — CBC
HEMATOCRIT: 40.3 % (ref 36.0–46.0)
HEMOGLOBIN: 12.9 g/dL (ref 12.0–15.0)
MCH: 30.4 pg (ref 26.0–34.0)
MCHC: 32 g/dL (ref 30.0–36.0)
MCV: 95 fL (ref 80.0–100.0)
PLATELETS: 185 10*3/uL (ref 150–400)
RBC: 4.24 MIL/uL (ref 3.87–5.11)
RDW: 13.8 % (ref 11.5–15.5)
WBC: 7.5 10*3/uL (ref 4.0–10.5)
nRBC: 0 % (ref 0.0–0.2)

## 2018-07-12 LAB — I-STAT TROPONIN, ED: TROPONIN I, POC: 0.03 ng/mL (ref 0.00–0.08)

## 2018-07-12 LAB — TROPONIN I: TROPONIN I: 0.03 ng/mL — AB (ref <0.03)

## 2018-07-12 LAB — D-DIMER, QUANTITATIVE: D-Dimer, Quant: 1.11 ug/mL-FEU — ABNORMAL HIGH (ref 0.00–0.50)

## 2018-07-12 MED ORDER — TECHNETIUM TC 99M DIETHYLENETRIAME-PENTAACETIC ACID
32.3000 | Freq: Once | INTRAVENOUS | Status: AC | PRN
  Administered 2018-07-12: 32.3 via RESPIRATORY_TRACT

## 2018-07-12 MED ORDER — IPRATROPIUM BROMIDE 0.03 % NA SOLN: 1.0000 | Freq: Two times a day (BID) | NASAL | Status: DC | PRN

## 2018-07-12 MED ORDER — ACETAMINOPHEN 325 MG PO TABS: 650.0000 mg | ORAL_TABLET | Freq: Four times a day (QID) | ORAL | Status: DC | PRN

## 2018-07-12 MED ORDER — CETIRIZINE HCL 10 MG PO TABS
10.0000 mg | ORAL_TABLET | Freq: Every day | ORAL | Status: DC
  Administered 2018-07-13 – 2018-07-14 (×2): 10 mg via ORAL
  Filled 2018-07-12 (×2): qty 1

## 2018-07-12 MED ORDER — ASPIRIN EC 81 MG PO TBEC
81.0000 mg | DELAYED_RELEASE_TABLET | Freq: Every day | ORAL | Status: DC
  Administered 2018-07-13 – 2018-07-14 (×2): 81 mg via ORAL
  Filled 2018-07-12 (×2): qty 1

## 2018-07-12 MED ORDER — METOPROLOL TARTRATE 25 MG PO TABS
100.0000 mg | ORAL_TABLET | Freq: Two times a day (BID) | ORAL | Status: DC
  Administered 2018-07-13 – 2018-07-14 (×4): 100 mg via ORAL
  Filled 2018-07-12 (×4): qty 4

## 2018-07-12 MED ORDER — ALPRAZOLAM 0.5 MG PO TABS
0.5000 mg | ORAL_TABLET | Freq: Three times a day (TID) | ORAL | Status: DC | PRN
  Administered 2018-07-13 – 2018-07-14 (×3): 0.5 mg via ORAL
  Filled 2018-07-12 (×3): qty 1

## 2018-07-12 MED ORDER — CALCIUM CARBONATE-VITAMIN D 500-200 MG-UNIT PO TABS
1.0000 | ORAL_TABLET | Freq: Every day | ORAL | Status: DC
  Administered 2018-07-13 – 2018-07-14 (×2): 1 via ORAL
  Filled 2018-07-12 (×2): qty 1

## 2018-07-12 MED ORDER — LABETALOL HCL 5 MG/ML IV SOLN
10.0000 mg | Freq: Once | INTRAVENOUS | Status: AC
  Administered 2018-07-12: 10 mg via INTRAVENOUS
  Filled 2018-07-12: qty 4

## 2018-07-12 MED ORDER — LOSARTAN POTASSIUM 50 MG PO TABS
100.0000 mg | ORAL_TABLET | Freq: Every day | ORAL | Status: DC
  Administered 2018-07-13 – 2018-07-14 (×2): 100 mg via ORAL
  Filled 2018-07-12 (×2): qty 2

## 2018-07-12 MED ORDER — ZOLPIDEM TARTRATE 5 MG PO TABS
5.0000 mg | ORAL_TABLET | Freq: Every evening | ORAL | Status: DC | PRN
  Filled 2018-07-12: qty 1

## 2018-07-12 MED ORDER — VITAMIN C 500 MG PO TABS
1000.0000 mg | ORAL_TABLET | Freq: Every day | ORAL | Status: DC
  Administered 2018-07-13 – 2018-07-14 (×2): 1000 mg via ORAL
  Filled 2018-07-12 (×2): qty 2

## 2018-07-12 MED ORDER — ONDANSETRON HCL 4 MG/2ML IJ SOLN: 4.0000 mg | Freq: Four times a day (QID) | INTRAMUSCULAR | Status: DC | PRN

## 2018-07-12 MED ORDER — PANTOPRAZOLE SODIUM 40 MG PO TBEC
40.0000 mg | DELAYED_RELEASE_TABLET | Freq: Every day | ORAL | Status: DC
  Administered 2018-07-13 – 2018-07-14 (×2): 40 mg via ORAL
  Filled 2018-07-12 (×2): qty 1

## 2018-07-12 MED ORDER — LABETALOL HCL 5 MG/ML IV SOLN
10.0000 mg | INTRAVENOUS | Status: DC | PRN
  Filled 2018-07-12: qty 4

## 2018-07-12 MED ORDER — VITAMIN E 180 MG (400 UNIT) PO CAPS
400.0000 [IU] | ORAL_CAPSULE | Freq: Every day | ORAL | Status: DC
  Administered 2018-07-13 – 2018-07-14 (×2): 400 [IU] via ORAL
  Filled 2018-07-12 (×2): qty 1

## 2018-07-12 MED ORDER — MORPHINE SULFATE (PF) 2 MG/ML IV SOLN: 2.0000 mg | INTRAVENOUS | Status: DC | PRN

## 2018-07-12 MED ORDER — NYSTATIN 100000 UNIT/ML MT SUSP
5.0000 mL | Freq: Four times a day (QID) | OROMUCOSAL | Status: DC
  Administered 2018-07-13 – 2018-07-14 (×5): 500000 [IU] via ORAL
  Filled 2018-07-12 (×5): qty 5

## 2018-07-12 MED ORDER — NITROGLYCERIN 0.4 MG SL SUBL: 0.4000 mg | SUBLINGUAL_TABLET | SUBLINGUAL | Status: DC | PRN

## 2018-07-12 MED ORDER — TECHNETIUM TO 99M ALBUMIN AGGREGATED
4.3100 | Freq: Once | INTRAVENOUS | Status: AC | PRN
  Administered 2018-07-12: 4.31 via INTRAVENOUS

## 2018-07-12 MED ORDER — PRAVASTATIN SODIUM 20 MG PO TABS
40.0000 mg | ORAL_TABLET | Freq: Every day | ORAL | Status: DC
  Administered 2018-07-13: 40 mg via ORAL
  Filled 2018-07-12: qty 2

## 2018-07-12 MED ORDER — SODIUM CHLORIDE 0.9 % IV BOLUS
1000.0000 mL | Freq: Once | INTRAVENOUS | Status: AC
  Administered 2018-07-12: 1000 mL via INTRAVENOUS

## 2018-07-12 MED ORDER — FLUTICASONE PROPIONATE HFA 110 MCG/ACT IN AERO: 2.0000 | INHALATION_SPRAY | Freq: Two times a day (BID) | RESPIRATORY_TRACT | Status: DC

## 2018-07-12 MED ORDER — POTASSIUM CHLORIDE 20 MEQ/15ML (10%) PO SOLN
40.0000 meq | Freq: Once | ORAL | Status: AC
  Administered 2018-07-13: 40 meq via ORAL
  Filled 2018-07-12: qty 30

## 2018-07-12 MED ORDER — VITAMIN B-12 1000 MCG PO TABS
1000.0000 ug | ORAL_TABLET | Freq: Every day | ORAL | Status: DC
  Administered 2018-07-13 – 2018-07-14 (×2): 1000 ug via ORAL
  Filled 2018-07-12 (×2): qty 1

## 2018-07-12 MED ORDER — ALBUTEROL SULFATE (2.5 MG/3ML) 0.083% IN NEBU: 3.0000 mL | INHALATION_SOLUTION | Freq: Four times a day (QID) | RESPIRATORY_TRACT | Status: DC | PRN

## 2018-07-12 MED ORDER — MAGNESIUM OXIDE 400 (241.3 MG) MG PO TABS
400.0000 mg | ORAL_TABLET | Freq: Every day | ORAL | Status: DC
  Administered 2018-07-13 – 2018-07-14 (×2): 400 mg via ORAL
  Filled 2018-07-12 (×2): qty 1

## 2018-07-12 MED ORDER — BIOTIN 5 MG PO CAPS: ORAL_CAPSULE | Freq: Every morning | ORAL | Status: DC

## 2018-07-12 MED ORDER — ENOXAPARIN SODIUM 40 MG/0.4ML ~~LOC~~ SOLN
40.0000 mg | Freq: Every day | SUBCUTANEOUS | Status: DC
  Administered 2018-07-13 – 2018-07-14 (×2): 40 mg via SUBCUTANEOUS
  Filled 2018-07-12: qty 0.4

## 2018-07-12 MED ORDER — EPINEPHRINE 0.3 MG/0.3ML IJ SOAJ: 0.3000 mg | INTRAMUSCULAR | Status: DC | PRN

## 2018-07-12 MED ORDER — CYCLOSPORINE 0.05 % OP EMUL
1.0000 [drp] | Freq: Two times a day (BID) | OPHTHALMIC | Status: DC
  Administered 2018-07-13 – 2018-07-14 (×3): 1 [drp] via OPHTHALMIC
  Filled 2018-07-12 (×5): qty 1

## 2018-07-12 NOTE — ED Notes (Addendum)
Eddie on call with nuclear medicine contacted for scan.

## 2018-07-12 NOTE — Telephone Encounter (Signed)
Patient called in with c/o "chest tightness." She says "this started after taking her new BP medication this morning. It has been off and on all day. I was at work and felt like I was going to pass out, so I came outside and sitting in my care now. I am shaking and scared. The pressure is in my throat at the top of my chest. It's a 4 pain level." I asked about radiation, she denies. I asked about other symptoms, she says "I have nausea, sweating earlier, and difficulty breathing." According to protocol, go to ED. I asked is there someone to drive, she says "no, I'm pulling off now driving myself. I'm only about 5-10 minutes away. Will you stay on the phone with me until I get to [facility]?" I advised I will stay on the phone. She arrived at the ED without any harm, she did say she felt like she was going to throw up, I asked her did she need to pull off, she declined and said she can make it there, just nervous and shaking. Care advice was given and patient verbalized understanding.   Reason for Disposition . Difficulty breathing  Answer Assessment - Initial Assessment Questions 1. LOCATION: "Where does it hurt?"       At the throat where the chest starts 2. RADIATION: "Does the pain go anywhere else?" (e.g., into neck, jaw, arms, back)     No 3. ONSET: "When did the chest pain begin?" (Minutes, hours or days)      Off and on all day 4. PATTERN "Does the pain come and go, or has it been constant since it started?"  "Does it get worse with exertion?"      Off and on, feels like pressure, no pain 5. DURATION: "How long does it last" (e.g., seconds, minutes, hours)     All day off and on, worse in the last 20 minutes 6. SEVERITY: "How bad is the pain?"  (e.g., Scale 1-10; mild, moderate, or severe)    - MILD (1-3): doesn't interfere with normal activities     - MODERATE (4-7): interferes with normal activities or awakens from sleep    - SEVERE (8-10): excruciating pain, unable to do any normal  activities       4 7. CARDIAC RISK FACTORS: "Do you have any history of heart problems or risk factors for heart disease?" (e.g., prior heart attack, angina; high blood pressure, diabetes, being overweight, high cholesterol, smoking, or strong family history of heart disease)     Yes 8. PULMONARY RISK FACTORS: "Do you have any history of lung disease?"  (e.g., blood clots in lung, asthma, emphysema, birth control pills)     Asthma 9. CAUSE: "What do you think is causing the chest pain?"     I don't know 10. OTHER SYMPTOMS: "Do you have any other symptoms?" (e.g., dizziness, nausea, vomiting, sweating, fever, difficulty breathing, cough)       Nausea, sweating earlier, difficulty breathing 11. PREGNANCY: "Is there any chance you are pregnant?" "When was your last menstrual period?"       No  Protocols used: CHEST PAIN-A-AH

## 2018-07-12 NOTE — ED Notes (Addendum)
Date and time results received: 07/12/18 10:16 PM   Test: Troponin Critical Value: 0.03  Name of Provider Notified: Gilford Raid

## 2018-07-12 NOTE — ED Notes (Signed)
Patient being transported to XR

## 2018-07-12 NOTE — ED Notes (Signed)
[name] with [facility] at bedside. Patient transported to [place]. Scan to take about 1 hour.
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
## 2018-07-12 NOTE — ED Triage Notes (Signed)
Pt c/o not feeling well for several days. reports chest pressure.  Pt reports that went to PCP yesterday and was started on new HTN medications.
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
## 2018-07-13 ENCOUNTER — Other Ambulatory Visit: Payer: Self-pay

## 2018-07-13 ENCOUNTER — Observation Stay (HOSPITAL_BASED_OUTPATIENT_CLINIC_OR_DEPARTMENT_OTHER): Payer: Medicare HMO

## 2018-07-13 DIAGNOSIS — I10 Essential (primary) hypertension: Secondary | ICD-10-CM

## 2018-07-13 DIAGNOSIS — K219 Gastro-esophageal reflux disease without esophagitis: Secondary | ICD-10-CM | POA: Diagnosis not present

## 2018-07-13 DIAGNOSIS — R609 Edema, unspecified: Secondary | ICD-10-CM | POA: Diagnosis not present

## 2018-07-13 DIAGNOSIS — I130 Hypertensive heart and chronic kidney disease with heart failure and stage 1 through stage 4 chronic kidney disease, or unspecified chronic kidney disease: Secondary | ICD-10-CM | POA: Diagnosis not present

## 2018-07-13 DIAGNOSIS — R000 Tachycardia, unspecified: Secondary | ICD-10-CM | POA: Diagnosis not present

## 2018-07-13 DIAGNOSIS — I5030 Unspecified diastolic (congestive) heart failure: Secondary | ICD-10-CM | POA: Diagnosis not present

## 2018-07-13 DIAGNOSIS — I5032 Chronic diastolic (congestive) heart failure: Secondary | ICD-10-CM

## 2018-07-13 DIAGNOSIS — N179 Acute kidney failure, unspecified: Secondary | ICD-10-CM | POA: Diagnosis not present

## 2018-07-13 DIAGNOSIS — F411 Generalized anxiety disorder: Secondary | ICD-10-CM | POA: Diagnosis not present

## 2018-07-13 DIAGNOSIS — R0789 Other chest pain: Secondary | ICD-10-CM | POA: Diagnosis not present

## 2018-07-13 DIAGNOSIS — R7989 Other specified abnormal findings of blood chemistry: Secondary | ICD-10-CM

## 2018-07-13 DIAGNOSIS — J4530 Mild persistent asthma, uncomplicated: Secondary | ICD-10-CM | POA: Diagnosis not present

## 2018-07-13 DIAGNOSIS — E785 Hyperlipidemia, unspecified: Secondary | ICD-10-CM | POA: Diagnosis not present

## 2018-07-13 DIAGNOSIS — I2510 Atherosclerotic heart disease of native coronary artery without angina pectoris: Secondary | ICD-10-CM | POA: Diagnosis not present

## 2018-07-13 DIAGNOSIS — N182 Chronic kidney disease, stage 2 (mild): Secondary | ICD-10-CM | POA: Diagnosis not present

## 2018-07-13 DIAGNOSIS — R7303 Prediabetes: Secondary | ICD-10-CM | POA: Diagnosis not present

## 2018-07-13 DIAGNOSIS — E876 Hypokalemia: Secondary | ICD-10-CM

## 2018-07-13 LAB — BASIC METABOLIC PANEL
ANION GAP: 8 (ref 5–15)
BUN: 15 mg/dL (ref 8–23)
CALCIUM: 10.1 mg/dL (ref 8.9–10.3)
CO2: 25 mmol/L (ref 22–32)
Chloride: 110 mmol/L (ref 98–111)
Creatinine, Ser: 0.88 mg/dL (ref 0.44–1.00)
GFR calc Af Amer: >60 mL/min (ref >60)
Glucose, Bld: 129 mg/dL — ABNORMAL HIGH (ref 70–99)
Potassium: 4.2 mmol/L (ref 3.5–5.1)
Sodium: 143 mmol/L (ref 135–145)

## 2018-07-13 LAB — ECHOCARDIOGRAM COMPLETE
Height: 63 in
Weight: 2659.63 oz

## 2018-07-13 LAB — LIPID PANEL
CHOL/HDL RATIO: 3.7 ratio
Cholesterol: 141 mg/dL (ref 0–200)
HDL: 38 mg/dL — AB (ref >40)
LDL Cholesterol: 84 mg/dL (ref 0–99)
TRIGLYCERIDES: 96 mg/dL (ref <150)
VLDL: 19 mg/dL (ref 0–40)

## 2018-07-13 LAB — URINALYSIS, ROUTINE W REFLEX MICROSCOPIC
Bilirubin Urine: NEGATIVE
GLUCOSE, UA: NEGATIVE mg/dL
HGB URINE DIPSTICK: NEGATIVE
Ketones, ur: NEGATIVE mg/dL
Nitrite: NEGATIVE
PH: 5 (ref 5.0–8.0)
PROTEIN: NEGATIVE mg/dL
Specific Gravity, Urine: 1.011 (ref 1.005–1.030)

## 2018-07-13 LAB — URINALYSIS W MICROSCOPIC + REFLEX CULTURE
BILIRUBIN URINE: NEGATIVE
Bacteria, UA: NONE SEEN /HPF
GLUCOSE, UA: NEGATIVE
Hgb urine dipstick: NEGATIVE
Hyaline Cast: NONE SEEN /LPF
Ketones, ur: NEGATIVE
NITRITES URINE, INITIAL: NEGATIVE
Protein, ur: NEGATIVE
RBC / HPF: NONE SEEN /HPF (ref 0–2)
Specific Gravity, Urine: 1.008 (ref 1.001–1.03)
pH: 6 (ref 5.0–8.0)

## 2018-07-13 LAB — CBC WITH DIFFERENTIAL/PLATELET
Abs Immature Granulocytes: 0.01 10*3/uL (ref 0.00–0.07)
BASOS ABS: 0 10*3/uL (ref 0.0–0.1)
BASOS PCT: 0 %
EOS ABS: 0.1 10*3/uL (ref 0.0–0.5)
EOS PCT: 1 %
HCT: 36.8 % (ref 36.0–46.0)
Hemoglobin: 11.7 g/dL — ABNORMAL LOW (ref 12.0–15.0)
IMMATURE GRANULOCYTES: 0 %
Lymphocytes Relative: 28 %
Lymphs Abs: 1.4 10*3/uL (ref 0.7–4.0)
MCH: 30.5 pg (ref 26.0–34.0)
MCHC: 31.8 g/dL (ref 30.0–36.0)
MCV: 96.1 fL (ref 80.0–100.0)
Monocytes Absolute: 0.4 10*3/uL (ref 0.1–1.0)
Monocytes Relative: 8 %
NEUTROS PCT: 63 %
NRBC: 0 % (ref 0.0–0.2)
Neutro Abs: 3.1 10*3/uL (ref 1.7–7.7)
PLATELETS: 181 10*3/uL (ref 150–400)
RBC: 3.83 MIL/uL — ABNORMAL LOW (ref 3.87–5.11)
RDW: 14.2 % (ref 11.5–15.5)
WBC: 4.9 10*3/uL (ref 4.0–10.5)

## 2018-07-13 LAB — HEMOGLOBIN A1C
HEMOGLOBIN A1C: 5.8 % — AB (ref 4.8–5.6)
MEAN PLASMA GLUCOSE: 119.76 mg/dL

## 2018-07-13 LAB — URINE CULTURE
MICRO NUMBER: 91311326
SPECIMEN QUALITY:: ADEQUATE

## 2018-07-13 LAB — RAPID URINE DRUG SCREEN, HOSP PERFORMED
Amphetamines: NOT DETECTED
BENZODIAZEPINES: POSITIVE — AB
Barbiturates: NOT DETECTED
Cocaine: NOT DETECTED
OPIATES: NOT DETECTED
Tetrahydrocannabinol: NOT DETECTED

## 2018-07-13 LAB — TROPONIN I
Troponin I: <0.03 ng/mL (ref <0.03)
Troponin I: <0.03 ng/mL (ref <0.03)

## 2018-07-13 LAB — MAGNESIUM: MAGNESIUM: 1.8 mg/dL (ref 1.7–2.4)

## 2018-07-13 LAB — HIV ANTIBODY (ROUTINE TESTING W REFLEX): HIV Screen 4th Generation wRfx: NONREACTIVE

## 2018-07-13 LAB — CULTURE INDICATED

## 2018-07-13 MED ORDER — FUROSEMIDE 10 MG/ML IJ SOLN
20.0000 mg | Freq: Once | INTRAMUSCULAR | Status: AC
  Administered 2018-07-13: 20 mg via INTRAVENOUS
  Filled 2018-07-13: qty 2

## 2018-07-13 MED ORDER — LABETALOL HCL 5 MG/ML IV SOLN
10.0000 mg | INTRAVENOUS | Status: DC | PRN
  Filled 2018-07-13: qty 4

## 2018-07-13 MED ORDER — AMLODIPINE BESYLATE 5 MG PO TABS
5.0000 mg | ORAL_TABLET | Freq: Every day | ORAL | Status: DC
  Administered 2018-07-13: 5 mg via ORAL
  Filled 2018-07-13: qty 1

## 2018-07-13 MED ORDER — DOXYCYCLINE HYCLATE 100 MG PO TABS: 100.0000 mg | ORAL_TABLET | Freq: Two times a day (BID) | ORAL | 0 refills | Status: DC

## 2018-07-13 MED ORDER — DIPHENHYDRAMINE HCL 50 MG/ML IJ SOLN
25.0000 mg | Freq: Four times a day (QID) | INTRAMUSCULAR | Status: DC | PRN
  Administered 2018-07-13: 25 mg via INTRAVENOUS
  Filled 2018-07-13: qty 1

## 2018-07-13 MED ORDER — BUDESONIDE 0.5 MG/2ML IN SUSP
1.0000 mg | Freq: Two times a day (BID) | RESPIRATORY_TRACT | Status: DC
  Administered 2018-07-13 – 2018-07-14 (×3): 1 mg via RESPIRATORY_TRACT
  Filled 2018-07-13 (×3): qty 4

## 2018-07-13 MED ORDER — ALUM & MAG HYDROXIDE-SIMETH 200-200-20 MG/5ML PO SUSP
30.0000 mL | Freq: Once | ORAL | Status: AC
  Administered 2018-07-13: 30 mL via ORAL
  Filled 2018-07-13: qty 30

## 2018-07-13 MED ORDER — MAGNESIUM SULFATE 2 GM/50ML IV SOLN
2.0000 g | Freq: Once | INTRAVENOUS | Status: AC
  Administered 2018-07-13: 2 g via INTRAVENOUS
  Filled 2018-07-13: qty 50

## 2018-07-13 MED ORDER — VALACYCLOVIR HCL 500 MG PO TABS
500.0000 mg | ORAL_TABLET | Freq: Two times a day (BID) | ORAL | Status: DC
  Administered 2018-07-13 – 2018-07-14 (×3): 500 mg via ORAL
  Filled 2018-07-13 (×4): qty 1

## 2018-07-13 NOTE — Addendum Note (Signed)
Addended by: Wilfred Lacy L on: 07/13/2018 10:46 AM   Modules accepted: Orders

## 2018-07-13 NOTE — Consult Note (Addendum)
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 reviewed with pharmacy   Not chemically related to verapamil  2  HTN   As above   After yesterday I would avoid hydralazine   Will continue to follow.  Dorris Carnes

## 2018-07-13 NOTE — Progress Notes (Signed)
PROGRESS NOTE    [name]  PFX:[id number] DOB: Jul 06, 1951 DOA: 07/12/2018 PCP: Flossie Buffy, NP   Brief Narrative:  Per Dr.Niu  [name] is a 67 y.o. female with medical history significant of hypertension, hyperlipidemia, prediabetes, asthma, stroke, GERD, depression with anxiety, breast cancer, CAD, gastric ulcer disease, OSA not on CPAP, dCHF, CKD-2, who presented with chest pressure.  Stated that she has not been feeling well in the past several days.  Since this morning, she developed chest pressure.  It is located in the substernal area, initially 8 out of 10 in severity, currently 4 out of 10 severity, radiating to the right neck.  It is associated with shortness of breath.  She has dry cough, but no fever or chills.  She has nausea, no vomiting or abdominal pain.  Patient states that she had several episode of loose stool bowel movement early, currently no diarrhea.  No symptoms of UTI or unilateral weakness.  Patient stated that her PCP adjusted her blood pressure medications, and added hydralazine recently.  Patient stated that she was allergic to hydralazine, causing itchy.  She does not want to take this medication anymore.  Patient had nuclear stress test on 03/21/2018:  Nuclear stress EF: 59%.  The left ventricular ejection fraction is normal (55-65%).  The study is normal.  There was no ST segment deviation noted during stress.  This is a low risk study.  Patient used to be seen by internal medicine teaching service, but currently patient has primary care doctor in [place]  ED Course: pt was found to have troponin 0.03, WBC 7.5, positive d-dimer 1.1, negative VQ scan for PE, potassium 3.2, worsening renal function, temperature normal, tachycardia, tachypnea, oxygen saturation 96% on room air, negative chest x-ray.  Patient is placed on telemetry bed for observation.    Assessment & Plan:   Principal Problem:   Chest pressure Active Problems:   Essential hypertension   Coronary atherosclerosis   Asthma   GERD (gastroesophageal reflux disease)   HLD (hyperlipidemia)   Generalized anxiety disorder   Hypokalemia   Chronic diastolic heart failure (HCC)   Stroke (HCC)   Elevated troponin   Acute renal failure superimposed on stage 2 chronic kidney disease (HCC)  #1 chest pressure, minimally elevated troponin with history of coronary artery disease Questionable etiology.  Patient with minimally elevated troponin of 0.03.  D-dimer is elevated at 1.11 however VQ scan which was done was negative for pulmonary emboli.  Concern as to whether this is secondary to a demand ischemia secondary to elevated blood pressure.  Patient initially noted to have a blood pressure of 160/90.  Patient with multiple allergies. Patient with recent Myoview stress test on 03/21/2018.  Patient admitted placed on telemetry.  Cardiac enzymes essentially negative x2 since initial troponin.  2D echo pending.  LDL of 84.  EKG with no ischemic changes noted.  Lower extremity Dopplers negative for DVT.  Patient had an upper endoscopy done July 30th  2019 which was unremarkable.   Patient seen in consultation by cardiology.  Question as to whether patient's tachycardia may have resulted after hydralazine.  Question of diastolic dysfunction with questionable small vessel problems.  Patient with somewhat reproducible chest wall pain however denies this is different from chest pressure she felt on admission.  Patient given a dose of Lasix IV x1 per cardiology and started on Norvasc to help with blood pressure vasodilatation. Patient however this afternoon after receiving amlodipine complained to nurse of  a squeezing sensation or tightness in her throat which was very uncomfortable and felt it was related to the new blood pressure pill she was started on of Norvasc.  Patient was however speaking in full sentences with no tongue swelling and swallowing liquids without any problems.   Will discontinue Norvasc for now until patient is reassessed by cardiology in the morning.  Cardiology following and I appreciate the input and recommendations.  2.  Hypertension Patient seen by cardiology recommended to avoid hydralazine.  Patient started on Norvasc however after receiving office patient complained to nurse of a squeezing sensation/tightness in the throat which she felt was related to the new blood pressure pill.  Patient however was speaking in full sentences with no tongue swelling and was able to swallow liquids without any problems.  Will discontinue Norvasc for now until patient is reassessed by cardiology tomorrow.  Continue Cozaar, Lopressor.  Per cardiology.  3.  Gastroesophageal reflux disease PPI.  4.  Asthma Stable.  Continue bronchodilators.  5.  Hyperlipidemia LDL of 84.  Continue statin.  6.  Generalized anxiety disorder Xanax as needed.  7.  Hypokalemia Repleted.  Magnesium level at 1.8.  Follow.  8.  Chronic diastolic heart failure 2D echo from 05/04/2016 with a EF of 60 to 65%.  Patient currently euvolemic on examination.  Compensated.  Spironolactone held secondary to worsening renal function.  Continue Cozaar, Lopressor.  Per cardiology.  9.  History of CVA Stable.  Continue statin.  Continue aspirin for secondary stroke prophylaxis.   DVT prophylaxis: Lovenox Code Status: Full Family Communication: Updated patient.  No family at bedside. Disposition Plan: Home when clinically improved, medically stable and when okay with cardiology.   Consultants:   Cardiology: Dr. Harrington Challenger 07/13/2018  Procedures:   2D echo pending 07/13/2018  Chest x-ray 07/12/2018  Lower extremity Dopplers 07/13/2018  VQ scan 07/12/2018  Antimicrobials:  None   Subjective: Patient with complaints of chest wall tenderness.  Patient stated chest pain on admission felt like a pressure pushing down on her chest.  Denies any current shortness of breath.  Patient states  was recently started on Valtrex for possible herpetic infection on her buttocks.  Objective: Vitals:   07/13/18 0105 07/13/18 0600 07/13/18 1014 07/13/18 1037  BP: (!) 185/101 (!) 144/85 (!) 163/96   Pulse: (!) 104 77 84   Resp: 18 12 16    Temp: 99.1 F (37.3 C) 97.7 F (36.5 C) 98.7 F (37.1 C)   TempSrc: Oral Oral Oral   SpO2: 100% 97% 98% 98%  Weight: 75.4 kg     Height: 5\' 3"  (1.6 m)       Intake/Output Summary (Last 24 hours) at 07/13/2018 1308 Last data filed at 07/13/2018 0900 Gross per 24 hour  Intake 1000 ml  Output 300 ml  Net 700 ml   Filed Weights   07/13/18 0105  Weight: 75.4 kg    Examination:  General exam: Appears calm and comfortable  Respiratory system: Clear to auscultation. Respiratory effort normal. Cardiovascular system: S1 & S2 heard, RRR. No JVD, murmurs, rubs, gallops or clicks. No pedal edema.  Chest wall with tenderness to palpation. Gastrointestinal system: Abdomen is nondistended, soft and nontender. No organomegaly or masses felt. Normal bowel sounds heard. Central nervous system: Alert and oriented. No focal neurological deficits. Extremities: Symmetric 5 x 5 power. Skin: No rashes, lesions or ulcers Psychiatry: Judgement and insight appear normal. Mood & affect appropriate.     Data Reviewed: I have personally reviewed  following labs and imaging studies  CBC: Recent Labs  Lab 07/12/18 1643 07/13/18 0718  WBC 7.5 4.9  NEUTROABS  --  3.1  HGB 12.9 11.7*  HCT 40.3 36.8  MCV 95.0 96.1  PLT 185 161   Basic Metabolic Panel: Recent Labs  Lab 07/12/18 1643 07/13/18 0718 07/13/18 0719  NA 139 143  --   K 3.2* 4.2  --   CL 104 110  --   CO2 26 25  --   GLUCOSE 178* 129*  --   BUN 19 15  --   CREATININE 1.30* 0.88  --   CALCIUM 10.2 10.1  --   MG  --   --  1.8   GFR: Estimated Creatinine Clearance: 62 mL/min (by C-G formula based on SCr of 0.88 mg/dL). Liver Function Tests: No results for input(s): AST, ALT, ALKPHOS,  BILITOT, PROT, ALBUMIN in the last 168 hours. No results for input(s): LIPASE, AMYLASE in the last 168 hours. No results for input(s): AMMONIA in the last 168 hours. Coagulation Profile: No results for input(s): INR, PROTIME in the last 168 hours. Cardiac Enzymes: Recent Labs  Lab 07/12/18 2024 07/13/18 0122 07/13/18 0719  TROPONINI 0.03* <0.03 <0.03   BNP (last 3 results) No results for input(s): PROBNP in the last 8760 hours. HbA1C: Recent Labs    07/13/18 0122  HGBA1C 5.8*   CBG: No results for input(s): GLUCAP in the last 168 hours. Lipid Profile: Recent Labs    07/13/18 0719  CHOL 141  HDL 38*  LDLCALC 84  TRIG 96  CHOLHDL 3.7   Thyroid Function Tests: Recent Labs    07/12/18 1658  TSH 1.196   Anemia Panel: No results for input(s): VITAMINB12, FOLATE, FERRITIN, TIBC, IRON, RETICCTPCT in the last 72 hours. Sepsis Labs: No results for input(s): PROCALCITON, LATICACIDVEN in the last 168 hours.  Recent Results (from the past 240 hour(s))  Urine Culture     Status: Abnormal   Collection Time: 07/11/18 11:07 AM  Result Value Ref Range Status   MICRO NUMBER: [id number]  Final   SPECIMEN QUALITY: ADEQUATE  Final   Sample Source URINE  Final   STATUS: FINAL  Final   ISOLATE 1: Streptococcus agalactiae (A)  Final    Comment: Greater than 100,000 CFU/mL of Group B Streptococcus isolated Beta-hemolytic Streptococci are predictably susceptible to penicillin and other beta-lactams. Susceptibility testing not routinely performed. Erythromycin and clindamycin are not recommended  for treatment of urinary tract infections, but clindamycin may be useful for treatment of rectovaginal colonization or infection.          Radiology Studies: Dg Chest 2 View  Result Date: 07/12/2018 CLINICAL DATA:  Pt c/o not feeling well for several days. reports chest pressure. Pt reports that went to PCP yesterday and was started on new HTN medications. PT HX: ex smoker, asthma, ex  smoker EXAM: CHEST - 2 VIEW COMPARISON:  none FINDINGS: Lungs are clear. Heart size and mediastinal contours are within normal limits. No effusion. Cervical fixation hardware partially visualized. Cholecystectomy clips. IMPRESSION: No acute cardiopulmonary disease. Electronically Signed   By: Lucrezia Europe M.D.   On: 07/12/2018 18:45   Nm Pulmonary Vent And Perf (v/q Scan)  Result Date: 07/12/2018 CLINICAL DATA:  Initial evaluation for acute chest pressure, suspected PE. EXAM: NUCLEAR MEDICINE VENTILATION - PERFUSION LUNG SCAN TECHNIQUE: Ventilation images were obtained in multiple projections using inhaled aerosol Tc-31m DTPA. Perfusion images were obtained in multiple projections after intravenous injection of Tc-45m-MAA. RADIOPHARMACEUTICALS:  32.3 mCi of Tc-35m DTPA aerosol inhalation and 4.31 mCi Tc51m-MAA IV COMPARISON:  Comparison made with prior radiograph from earlier the same day. FINDINGS: Ventilation: No focal ventilation defect. Perfusion: No wedge shaped peripheral perfusion defects to suggest acute pulmonary embolism. IMPRESSION: Negative VQ scan. No imaging findings to suggest pulmonary embolism identified. Electronically Signed   By: Jeannine Boga M.D.   On: 07/12/2018 22:32        Scheduled Meds: . amLODipine  5 mg Oral Daily  . aspirin EC  81 mg Oral Daily  . budesonide (PULMICORT) nebulizer solution  1 mg Nebulization BID  . calcium-vitamin D  1 tablet Oral Daily  . cetirizine  10 mg Oral Daily  . cycloSPORINE  1 drop Both Eyes BID  . enoxaparin (LOVENOX) injection  40 mg Subcutaneous Daily  . furosemide  20 mg Intravenous Once  . losartan  100 mg Oral Daily  . magnesium oxide  400 mg Oral Daily  . metoprolol tartrate  100 mg Oral BID  . nystatin  5 mL Oral QID  . pantoprazole  40 mg Oral Daily  . pravastatin  40 mg Oral q1800  . vitamin B-12  1,000 mcg Oral Daily  . ascorbic acid  1,000 mg Oral Daily  . vitamin E  400 Units Oral Daily   Continuous  Infusions:   LOS: 0 days    Time spent: 35 mins    Irine Seal, MD [facility] Pager [phone]  If 7PM-7AM, please contact night-coverage [email or web address] Password [email or web address] 07/13/2018, 1:08 PM

## 2018-07-13 NOTE — Progress Notes (Addendum)
Patient complained to nurse that she has a squeezing sensation or tightness in her throat and "is very uncomfortable". States she "thinks it's related to new BP pill"--Amlodipine. New medication was given earlier at 1403. She is able to speak normally, no tongue swelling, swallows liquids without any problem. VS taken and BP slightly elevated but within her range. Respirations 16. No SOB noted. MD notified and orders obtained. Eulas Post, RN

## 2018-07-13 NOTE — Progress Notes (Signed)
Bilateral lower extremity venous duplex completed. Preliminary results - There is no evidence of a DVT or Baker's cyst. Toma Copier, RVS 07/13/2018, 9:25 AM

## 2018-07-13 NOTE — Progress Notes (Signed)
PHARMACIST - PHYSICIAN ORDER COMMUNICATION  CONCERNING: P&T Medication Policy on Herbal Medications  DESCRIPTION:  This patient's order for:  Biotin  has been noted.  This product(s) is classified as an "herbal" or natural product. Due to a lack of definitive safety studies or FDA approval, nonstandard manufacturing practices, plus the potential risk of unknown drug-drug interactions while on inpatient medications, the Pharmacy and Therapeutics Committee does not permit the use of "herbal" or natural products of this type within [facility]   ACTION TAKEN: The pharmacy department is unable to verify this order at this time and your patient has been informed of this safety policy. Please reevaluate patient's clinical condition at discharge and address if the herbal or natural product(s) should be resumed at that time.   Thanks Dorrene German 07/13/2018 1:04 AM

## 2018-07-13 NOTE — Progress Notes (Signed)
Patient has had 3 loose BMs this shift with the last one being very watery. Pt has stated to nurse that she has had diarrhea for several weeks and has IBS. Will continue to monitor. Eulas Post, RN

## 2018-07-13 NOTE — Progress Notes (Signed)
  Echocardiogram 2D Echocardiogram has been performed.  [name] 07/13/2018, 12:44 PM

## 2018-07-13 NOTE — Progress Notes (Signed)
Writer noticed blister type spot on patients buttocks upon doing skin assessment, Writer looked over pts daily medications and noted Valacyclovir on pt list, Writer notified oncall provider of findings and possible shingles. Will continue to monitor.

## 2018-07-13 NOTE — Telephone Encounter (Signed)
FYI to [place].

## 2018-07-14 DIAGNOSIS — F411 Generalized anxiety disorder: Secondary | ICD-10-CM | POA: Diagnosis not present

## 2018-07-14 DIAGNOSIS — I5032 Chronic diastolic (congestive) heart failure: Secondary | ICD-10-CM | POA: Diagnosis not present

## 2018-07-14 DIAGNOSIS — R7989 Other specified abnormal findings of blood chemistry: Secondary | ICD-10-CM | POA: Diagnosis not present

## 2018-07-14 DIAGNOSIS — I10 Essential (primary) hypertension: Secondary | ICD-10-CM | POA: Diagnosis not present

## 2018-07-14 DIAGNOSIS — R000 Tachycardia, unspecified: Secondary | ICD-10-CM

## 2018-07-14 DIAGNOSIS — E785 Hyperlipidemia, unspecified: Secondary | ICD-10-CM | POA: Diagnosis not present

## 2018-07-14 DIAGNOSIS — R0789 Other chest pain: Secondary | ICD-10-CM | POA: Diagnosis not present

## 2018-07-14 DIAGNOSIS — J4530 Mild persistent asthma, uncomplicated: Secondary | ICD-10-CM | POA: Diagnosis not present

## 2018-07-14 DIAGNOSIS — K219 Gastro-esophageal reflux disease without esophagitis: Secondary | ICD-10-CM | POA: Diagnosis not present

## 2018-07-14 LAB — URINE CULTURE: Culture: >=100000 — AB

## 2018-07-14 LAB — BASIC METABOLIC PANEL
Anion gap: 7 (ref 5–15)
BUN: 16 mg/dL (ref 8–23)
CALCIUM: 10 mg/dL (ref 8.9–10.3)
CO2: 29 mmol/L (ref 22–32)
Chloride: 105 mmol/L (ref 98–111)
Creatinine, Ser: 1.05 mg/dL — ABNORMAL HIGH (ref 0.44–1.00)
GFR calc Af Amer: >60 mL/min (ref >60)
GFR, EST NON AFRICAN AMERICAN: 55 mL/min — AB (ref >60)
GLUCOSE: 139 mg/dL — AB (ref 70–99)
Potassium: 3.3 mmol/L — ABNORMAL LOW (ref 3.5–5.1)
Sodium: 141 mmol/L (ref 135–145)

## 2018-07-14 LAB — TROPONIN I

## 2018-07-14 LAB — MAGNESIUM: MAGNESIUM: 2 mg/dL (ref 1.7–2.4)

## 2018-07-14 MED ORDER — AMLODIPINE BESYLATE 5 MG PO TABS: 5.0000 mg | ORAL_TABLET | Freq: Every day | ORAL | 1 refills | Status: DC

## 2018-07-14 MED ORDER — POTASSIUM CHLORIDE CRYS ER 20 MEQ PO TBCR
40.0000 meq | EXTENDED_RELEASE_TABLET | Freq: Once | ORAL | Status: AC
  Administered 2018-07-14: 40 meq via ORAL
  Filled 2018-07-14: qty 2

## 2018-07-14 NOTE — Discharge Summary (Signed)
Physician Discharge Summary  [name] BOF:[id number] DOB: 01/07/1951 DOA: 07/12/2018  PCP: Flossie Buffy, NP  Admit date: 07/12/2018 Discharge date: 07/14/2018  Time spent: 65 minutes  Recommendations for Outpatient Follow-up:  1. Follow-up with Nche, Charlene Brooke, NP in 1-2 weeks.  On follow-up patient will need a basic metabolic profile done to follow-up on electrolytes and renal function. 2. Follow-up with Dr. Percival Spanish, cardiology in 2 weeks.  3. Follow-up with Dr. Hilarie Fredrickson, gastroenterology in 2 weeks.   Discharge Diagnoses:  Principal Problem:   Chest pressure Active Problems:   Essential hypertension   Coronary atherosclerosis   Asthma   GERD (gastroesophageal reflux disease)   HLD (hyperlipidemia)   Generalized anxiety disorder   Hypokalemia   Chronic diastolic heart failure (HCC)   Stroke (HCC)   Elevated troponin   Acute renal failure superimposed on stage 2 chronic kidney disease ([place])   Sinus tachycardia   Discharge Condition: Stable and improved  Diet recommendation: Heart healthy  Filed Weights   07/13/18 0105  Weight: 75.4 kg    History of present illness:  Per Dr Jeanene Erb is a 67 y.o. female with medical history significant of hypertension, hyperlipidemia, prediabetes, asthma, stroke, GERD, depression with anxiety, breast cancer, CAD, gastric ulcer disease, OSA not on CPAP, dCHF, CKD-2, who presented with chest pressure.  Stated that she had not been feeling well in the past several days.  Since the morning of admission, she developed chest pressure.  It was located in the substernal area, initially 8 out of 10 in severity, currently 4 out of 10 severity, radiating to the right neck.  It was associated with shortness of breath.  She had dry cough, but no fever or chills.  She was nauseaous, no vomiting or abdominal pain.  Patient stated that she had several episode of loose stool bowel movement early, currently no diarrhea.  No symptoms  of UTI or unilateral weakness.  Patient stated that her PCP adjusted her blood pressure medications, and added hydralazine recently.  Patient stated that she was allergic to hydralazine, causing itchy.  She did not want to take this medication anymore.  Patient had nuclear stress test on 03/21/2018:  Nuclear stress EF: 59%.  The left ventricular ejection fraction is normal (55-65%).  The study is normal.  There was no ST segment deviation noted during stress.  This is a low risk study.  Patient used to be seen by internal medicine teaching service, but currently patient has primary care doctor in [place]  ED Course: pt was found to have troponin 0.03, WBC 7.5, positive d-dimer 1.1, negative VQ scan for PE, potassium 3.2, worsening renal function, temperature normal, tachycardia, tachypnea, oxygen saturation 96% on room air, negative chest x-ray.  Patient is placed on telemetry bed for observation.   Hospital Course:  1 chest pressure, minimally elevated troponin with history of coronary artery disease Questionable etiology.  Patient with minimally elevated troponin of 0.03.  D-dimer is elevated at 1.11 however VQ scan which was done was negative for pulmonary emboli.  Concern as to whether this is secondary to a demand ischemia secondary to elevated blood pressure.  Patient initially noted to have a blood pressure of 160/90.  Patient with multiple allergies. Patient with recent Myoview stress test on 03/21/2018.  Patient admitted placed on telemetry.  Cardiac enzymes essentially negative x2 since initial troponin.  2D echo pending.  LDL of 84.  EKG with no ischemic changes noted.  Lower extremity Dopplers negative  for DVT.  Patient had an upper endoscopy done July 30th  2019 which was unremarkable.   Patient seen in consultation by cardiology.  Question as to whether patient's tachycardia may have resulted after hydralazine.  Question of diastolic dysfunction with questionable small vessel  problems.  Patient with somewhat reproducible chest wall pain however denied this was different from chest pressure she felt on admission.  Patient given a dose of Lasix IV x1 per cardiology and started on Norvasc to help with blood pressure vasodilatation. Patient however the afternoon after receiving amlodipine complained to nurse of a squeezing sensation or tightness in her throat which was very uncomfortable and felt it was related to the new blood pressure pill she was started on of Norvasc.  Patient was however speaking in full sentences with no tongue swelling and swallowing liquids without any problems.    Norvasc was initially discontinued however cardiology recommended resumption of Norvasc 5 mg daily on discharge the patient was willing to take it.   Patient improved clinically.  Had no further chest pain by day of discharge.  Cardiology made recommendations on discharge medications including continuation of patient's losartan and beta-blocker as well as starting patient on Norvasc 5 mg daily.  Patient's spironolactone was subsequently discontinued during the hospitalization due to increasing creatinine.  Outpatient follow-up with patient's primary cardiologist.  2.  Hypertension Patient seen by cardiology recommended to avoid hydralazine.  Patient started on Norvasc however after receiving the first dose, patient complained to nurse of a squeezing sensation/tightness in the throat which she felt was related to the new blood pressure pill.  Patient however was speaking in full sentences with no tongue swelling and was able to swallow liquids without any problems.  Norvasc initially discontinued and patient reassessed by cardiology who recommended resumption of Norvasc.  Patient was maintained on home regimen of Cozaar and Lopressor.  3.  Gastroesophageal reflux disease Maintained on a PPI.  4.  Asthma Remained stable throughout the hospitalization.  Patient was maintained on bronchodilators.   Outpatient follow-up.    5.  Hyperlipidemia LDL of 84.  Continued on home regimen of statin.  6.  Generalized anxiety disorder Patient maintained on Xanax as needed.  7.  Hypokalemia Repleted.  Magnesium level at 1.8.  Follow.  8.  Chronic diastolic heart failure 2D echo from 05/04/2016 with a EF of 60 to 65%.  Patient was euvolemic during the hospitalization.  Patient spironolactone was discontinued secondary to worsening renal function which improved during the hospitalization.  Patient was maintained on Cozaar and Lopressor.  Patient was seen in consultation by cardiology.  Outpatient follow-up with her cardiologist.    9.  History of CVA Stable.    Patient maintained on home regimen of statin as well as aspirin for secondary stroke prophylaxis.  Outpatient follow-up.     Procedures:  2D echo 07/13/2018  Chest x-ray 07/12/2018  Lower extremity Dopplers 07/13/2018  VQ scan 07/12/2018   Consultations:  Cardiology: Dr. Harrington Challenger 07/13/2018  Discharge Exam: Vitals:   07/14/18 0814 07/14/18 1444  BP:  (!) 151/88  Pulse: 72 77  Resp: 16 16  Temp:  98.5 F (36.9 C)  SpO2: 99% 97%    General: NAD Cardiovascular: RRR Respiratory: CTAB  Discharge Instructions   Discharge Instructions    Diet - low sodium heart healthy   Complete by:  As directed    Increase activity slowly   Complete by:  As directed      Allergies as of 07/14/2018  Reactions   Amoxicillin Anaphylaxis   Throat Swells Has patient had a PCN reaction causing immediate rash, facial/tongue/throat swelling, SOB or lightheadedness with hypotension: Yes Has patient had a PCN reaction causing severe rash involving mucus membranes or skin necrosis: No Has patient had a PCN reaction that required hospitalization: No Has patient had a PCN reaction occurring within the last 10 years: No If all of the above answers are "NO", then may proceed with Cephalosporin use.   Azithromycin Anaphylaxis, Other  (See Comments)   Throat Swelling   Bromfed Anaphylaxis   Throat Swelling   Cephalexin Anaphylaxis, Other (See Comments)   Throat Swelling Unknown   Chlordiazepoxide-clidinium Anaphylaxis   Throat Swelling   Claritin [loratadine] Anaphylaxis   Clotrimazole Swelling, Other (See Comments), Hypertension   Patient told me that she couldn't swallow due to the medication   Gabapentin Other (See Comments)   Nausea, weakness, lost movement and feeling in both legs (HAD TO CALL EMS)   Gatifloxacin Shortness Of Breath, Other (See Comments)   Caused bad chest congestion and caused a severe asthma attack   Ibuprofen Anaphylaxis   Iohexol Anaphylaxis, Shortness Of Breath, Swelling, Other (See Comments)    Code: HIVES, Desc: throat swelling no hives 20 yrs ago;needs pre-medication  09/19/07 sg, Onset Date: [id number]   Lidocaine Hives, Hypertension   REQUIRED A TRIP TO [place]   Lisinopril Other (See Comments)   ANGIOEDEMA   Other Other (See Comments)   PT IS HIGHLY ALLERGIC TO THE STICKY ELECTRODE PADS - CAUSES BLEEDING AND SKIN PEELING - LEAVES SCARS   Paroxetine Anaphylaxis   Throat Swelling   Penicillins Anaphylaxis, Hives   PATIENT HAS HAD A PCN REACTION WITH IMMEDIATE RASH, FACIAL/TONGUE/THROAT SWELLING, SOB, OR LIGHTHEADEDNESS WITH HYPOTENSION:  #  #  #  YES  #  #  #   Has patient had a PCN reaction causing severe rash involving mucus membranes or skin necrosis: No Has patient had a PCN reaction that required hospitalization No Has patient had a PCN reaction occurring within the last 10 years: No.   Prednisone Anaphylaxis, Swelling   Throat swelling   Pregabalin Anxiety, Anaphylaxis, Other (See Comments)   nervousness nervousness   Propoxyphene N-acetaminophen Anaphylaxis   REACTION: swelling in the throat   Sertraline Hcl Anaphylaxis   Throat Swelling   Sulfa Antibiotics Anaphylaxis   Sulfadiazine Anaphylaxis, Other (See Comments)   Throat Swelling Unknown   Verapamil  Anaphylaxis, Other (See Comments)   Throat Swelling Unknown   Adhesive [tape] Other (See Comments)   blisters   Clonazepam Nausea Only, Other (See Comments)   numbness, weakness in her arms, legs and increased tremors   Effexor [venlafaxine] Hives, Itching   Escitalopram Nausea Only, Other (See Comments)   LEXAPRO== Nausea, numb, tingly   Ibuprofen Nausea And Vomiting   Latex Swelling   Blisters on Skin   Sertraline Other (See Comments)   Other reaction(s): Other (See Comments) Other reaction(s): Unknown   Tussionex Pennkinetic Er [hydrocod Polst-cpm Polst Er] Itching, Photosensitivity   "Sunburn"   Paroxetine Hcl Other (See Comments)   Chlordiazepoxide-clidinium Other (See Comments)   Dicyclomine Hcl Hives   Hydralazine Hcl Itching, Rash   Pseudoephedrine Hives, Itching, Rash   Valium [diazepam] Itching, Nausea Only   Took in hospital and had nausea, couldn't swallow, itching      Medication List    STOP taking these medications   hydrALAZINE 25 MG tablet Commonly known as:  APRESOLINE   hydrocortisone 25 MG  suppository Commonly known as:  ANUSOL-HC   magnesium chloride 64 MG Tbec SR tablet Commonly known as:  SLOW-MAG   spironolactone 25 MG tablet Commonly known as:  ALDACTONE     TAKE these medications   acetaminophen 500 MG tablet Commonly known as:  TYLENOL Take 500-1,000 mg by mouth every 6 (six) hours as needed for moderate pain.   ALPRAZolam 1 MG tablet Commonly known as:  XANAX Take 0.5 tablets (0.5 mg total) by mouth 3 (three) times daily as needed for anxiety.   amLODipine 5 MG tablet Commonly known as:  NORVASC Take 1 tablet (5 mg total) by mouth daily.   ascorbic acid 1000 MG tablet Commonly known as:  VITAMIN C Take 1,000 mg by mouth daily.   aspirin EC 81 MG tablet Take 81 mg by mouth daily.   benzonatate 100 MG capsule Commonly known as:  TESSALON Take 1 capsule (100 mg total) by mouth 3 (three) times daily as needed for cough.    BIOTIN PO Take 1 tablet by mouth every morning. Hair Skin and Nails   CALCIUM-CARB 600 + D 600-125 MG-UNIT Tabs Generic drug:  Calcium Carbonate-Vitamin D Take 1 tablet by mouth daily.   cetirizine 10 MG tablet Commonly known as:  ZYRTEC Take 1 tablet (10 mg total) by mouth daily.   EPIPEN 2-PAK 0.3 mg/0.3 mL Soaj injection Generic drug:  EPINEPHrine Inject into the muscle.   FLOVENT HFA 110 MCG/ACT inhaler Generic drug:  fluticasone Inhale 2 puffs into the lungs 2 (two) times daily.   ipratropium 0.03 % nasal spray Commonly known as:  ATROVENT Place 1 spray into both nostrils 2 (two) times daily. What changed:    when to take this  reasons to take this   losartan 100 MG tablet Commonly known as:  COZAAR Take 1 tablet (100 mg total) by mouth daily.   Magnesium Oxide 400 (240 Mg) MG Tabs Take 1 tablet by mouth daily.   metoprolol tartrate 100 MG tablet Commonly known as:  LOPRESSOR Take 1 tablet (100 mg total) by mouth 2 (two) times daily.   nitroGLYCERIN 0.4 MG SL tablet Commonly known as:  NITROSTAT Place 0.4 mg under the tongue every 5 (five) minutes as needed for chest pain.   nystatin 100000 UNIT/ML suspension Commonly known as:  MYCOSTATIN Take 5 mLs (500,000 Units total) by mouth 4 (four) times daily.   ondansetron 4 MG tablet Commonly known as:  ZOFRAN Take 1 tablet (4 mg total) by mouth every 8 (eight) hours as needed for nausea or vomiting.   pantoprazole 40 MG tablet Commonly known as:  PROTONIX Take 1 tablet (40 mg total) by mouth daily.   potassium chloride SA 20 MEQ tablet Commonly known as:  K-DUR,KLOR-CON Take 20 mEq by mouth every other day.   pravastatin 40 MG tablet Commonly known as:  PRAVACHOL TAKE 1 TABLET BY MOUTH IN THE EVENING   RESTASIS 0.05 % ophthalmic emulsion Generic drug:  cycloSPORINE Place 1 drop into both eyes 2 (two) times daily.   valACYclovir 500 MG tablet Commonly known as:  VALTREX Take 500 mg by mouth daily  as needed (infection).   VENTOLIN HFA 108 (90 Base) MCG/ACT inhaler Generic drug:  albuterol Inhale 2 puffs into the lungs every 6 (six) hours as needed for wheezing or shortness of breath.   vitamin B-12 1000 MCG tablet Commonly known as:  CYANOCOBALAMIN Take 1 tablet (1,000 mcg total) by mouth daily.   vitamin E 400 UNIT capsule Generic  drug:  vitamin E Take 400 Units by mouth daily.      Allergies  Allergen Reactions  . Amoxicillin Anaphylaxis    Throat Swells Has patient had a PCN reaction causing immediate rash, facial/tongue/throat swelling, SOB or lightheadedness with hypotension: Yes Has patient had a PCN reaction causing severe rash involving mucus membranes or skin necrosis: No Has patient had a PCN reaction that required hospitalization: No Has patient had a PCN reaction occurring within the last 10 years: No If all of the above answers are "NO", then may proceed with Cephalosporin use.   . Azithromycin Anaphylaxis and Other (See Comments)    Throat Swelling  . Bromfed Anaphylaxis    Throat Swelling  . Cephalexin Anaphylaxis and Other (See Comments)    Throat Swelling Unknown  . Chlordiazepoxide-Clidinium Anaphylaxis    Throat Swelling  . Claritin [Loratadine] Anaphylaxis  . Clotrimazole Swelling, Other (See Comments) and Hypertension    Patient told me that she couldn't swallow due to the medication  . Gabapentin Other (See Comments)    Nausea, weakness, lost movement and feeling in both legs (HAD TO CALL EMS)  . Gatifloxacin Shortness Of Breath and Other (See Comments)    Caused bad chest congestion and caused a severe asthma attack  . Ibuprofen Anaphylaxis  . Iohexol Anaphylaxis, Shortness Of Breath, Swelling and Other (See Comments)     Code: HIVES, Desc: throat swelling no hives 20 yrs ago;needs pre-medication  09/19/07 sg, Onset Date: [id number]   . Lidocaine Hives and Hypertension    REQUIRED A TRIP TO [place]  . Lisinopril Other (See Comments)     ANGIOEDEMA  . Other Other (See Comments)    PT IS HIGHLY ALLERGIC TO THE STICKY ELECTRODE PADS - CAUSES BLEEDING AND SKIN PEELING - LEAVES SCARS  . Paroxetine Anaphylaxis    Throat Swelling  . Penicillins Anaphylaxis and Hives    PATIENT HAS HAD A PCN REACTION WITH IMMEDIATE RASH, FACIAL/TONGUE/THROAT SWELLING, SOB, OR LIGHTHEADEDNESS WITH HYPOTENSION:  #  #  #  YES  #  #  #   Has patient had a PCN reaction causing severe rash involving mucus membranes or skin necrosis: No Has patient had a PCN reaction that required hospitalization No Has patient had a PCN reaction occurring within the last 10 years: No.   . Prednisone Anaphylaxis and Swelling    Throat swelling   . Pregabalin Anxiety, Anaphylaxis and Other (See Comments)    nervousness nervousness  . Propoxyphene N-Acetaminophen Anaphylaxis    REACTION: swelling in the throat  . Sertraline Hcl Anaphylaxis    Throat Swelling  . Sulfa Antibiotics Anaphylaxis  . Sulfadiazine Anaphylaxis and Other (See Comments)    Throat Swelling Unknown  . Verapamil Anaphylaxis and Other (See Comments)    Throat Swelling Unknown  . Adhesive [Tape] Other (See Comments)    blisters  . Clonazepam Nausea Only and Other (See Comments)    numbness, weakness in her arms, legs and increased tremors  . Effexor [Venlafaxine] Hives and Itching  . Escitalopram Nausea Only and Other (See Comments)    LEXAPRO== Nausea, numb, tingly  . Ibuprofen Nausea And Vomiting  . Latex Swelling    Blisters on Skin  . Sertraline Other (See Comments)    Other reaction(s): Other (See Comments) Other reaction(s): Unknown  . Tussionex Pennkinetic Er [Hydrocod Polst-Cpm Polst Er] Itching and Photosensitivity    "Sunburn"  . Paroxetine Hcl Other (See Comments)  . Chlordiazepoxide-Clidinium Other (See Comments)  . Dicyclomine  Hcl Hives  . Hydralazine Hcl Itching and Rash  . Pseudoephedrine Hives, Itching and Rash  . Valium [Diazepam] Itching and Nausea Only    Took in  hospital and had nausea, couldn't swallow, itching    Follow-up Information    Nche, Charlene Brooke, NP. Schedule an appointment as soon as possible for a visit in 1 week(s).   Specialty:  Internal Medicine Why:  f/u in 1-2 weeks Contact information: [place] [address] [phone]        Minus Breeding, MD. Schedule an appointment as soon as possible for a visit in 1 week(s).   Specialty:  Cardiology Why:  f/u in 2 weeks. Contact information: [address] [place] [address] [phone]        Jerene Bears, MD Follow up.   Specialty:  Gastroenterology Why:  Follow-up as needed. Contact information: [address] [place] [address] [phone]            The results of significant diagnostics from this hospitalization (including imaging, microbiology, ancillary and laboratory) are listed below for reference.    Significant Diagnostic Studies: Dg Chest 2 View  Result Date: 07/12/2018 CLINICAL DATA:  Pt c/o not feeling well for several days. reports chest pressure. Pt reports that went to PCP yesterday and was started on new HTN medications. PT HX: ex smoker, asthma, ex smoker EXAM: CHEST - 2 VIEW COMPARISON:  none FINDINGS: Lungs are clear. Heart size and mediastinal contours are within normal limits. No effusion. Cervical fixation hardware partially visualized. Cholecystectomy clips. IMPRESSION: No acute cardiopulmonary disease. Electronically Signed   By: Lucrezia Europe M.D.   On: 07/12/2018 18:45   Nm Pulmonary Vent And Perf (v/q Scan)  Result Date: 07/12/2018 CLINICAL DATA:  Initial evaluation for acute chest pressure, suspected PE. EXAM: NUCLEAR MEDICINE VENTILATION - PERFUSION LUNG SCAN TECHNIQUE: Ventilation images were obtained in multiple projections using inhaled aerosol Tc-76m DTPA. Perfusion images were obtained in multiple projections after intravenous injection of Tc-24m-MAA. RADIOPHARMACEUTICALS:  32.3 mCi  of Tc-25m DTPA aerosol inhalation and 4.31 mCi Tc36m-MAA IV COMPARISON:  Comparison made with prior radiograph from earlier the same day. FINDINGS: Ventilation: No focal ventilation defect. Perfusion: No wedge shaped peripheral perfusion defects to suggest acute pulmonary embolism. IMPRESSION: Negative VQ scan. No imaging findings to suggest pulmonary embolism identified. Electronically Signed   By: Jeannine Boga M.D.   On: 07/12/2018 22:32   Mm 3d Screen Breast Bilateral  Result Date: 06/15/2018 CLINICAL DATA:  Screening. EXAM: DIGITAL SCREENING BILATERAL MAMMOGRAM WITH TOMO AND CAD COMPARISON:  Previous exam(s). ACR Breast Density Category b: There are scattered areas of fibroglandular density. FINDINGS: There are no findings suspicious for malignancy. Stable post lumpectomy changes on the right. Images were processed with CAD. IMPRESSION: No mammographic evidence of malignancy. A result letter of this screening mammogram will be mailed directly to the patient. RECOMMENDATION: Screening mammogram in one year. (Code:SM-B-01Y) BI-RADS CATEGORY  2: Benign. Electronically Signed   By: Lajean Manes M.D.   On: 06/15/2018 11:07    Microbiology: Recent Results (from the past 240 hour(s))  Urine Culture     Status: Abnormal   Collection Time: 07/11/18 11:07 AM  Result Value Ref Range Status   MICRO NUMBER: [id number]  Final   SPECIMEN QUALITY: ADEQUATE  Final   Sample Source URINE  Final   STATUS: FINAL  Final   ISOLATE 1: Streptococcus agalactiae (A)  Final    Comment: Greater than 100,000  CFU/mL of Group B Streptococcus isolated Beta-hemolytic Streptococci are predictably susceptible to penicillin and other beta-lactams. Susceptibility testing not routinely performed. Erythromycin and clindamycin are not recommended  for treatment of urinary tract infections, but clindamycin may be useful for treatment of rectovaginal colonization or infection.   Culture, Urine     Status: Abnormal   Collection  Time: 07/13/18  9:41 AM  Result Value Ref Range Status   Specimen Description   Final    URINE, CLEAN CATCH Performed at [facility], [place] [address], [place], [place] [address]    Special Requests   Final    NONE Performed at [facility], [place] [address] [place], [place] [address]    Culture (A)  Final    >=100,000 COLONIES/mL GROUP B STREP(S.AGALACTIAE)ISOLATED TESTING AGAINST S. AGALACTIAE NOT ROUTINELY PERFORMED DUE TO PREDICTABILITY OF AMP/PEN/VAN SUSCEPTIBILITY. Performed at [facility], [place] [address], [place], [place] [address]    Report Status 07/14/2018 FINAL  Final     Labs: Basic Metabolic Panel: Recent Labs  Lab 07/12/18 1643 07/13/18 0718 07/13/18 0719 07/14/18 0408  NA 139 143  --  141  K 3.2* 4.2  --  3.3*  CL 104 110  --  105  CO2 26 25  --  29  GLUCOSE 178* 129*  --  139*  BUN 19 15  --  16  CREATININE 1.30* 0.88  --  1.05*  CALCIUM 10.2 10.1  --  10.0  MG  --   --  1.8 2.0   Liver Function Tests: No results for input(s): AST, ALT, ALKPHOS, BILITOT, PROT, ALBUMIN in the last 168 hours. No results for input(s): LIPASE, AMYLASE in the last 168 hours. No results for input(s): AMMONIA in the last 168 hours. CBC: Recent Labs  Lab 07/12/18 1643 07/13/18 0718  WBC 7.5 4.9  NEUTROABS  --  3.1  HGB 12.9 11.7*  HCT 40.3 36.8  MCV 95.0 96.1  PLT 185 181   Cardiac Enzymes: Recent Labs  Lab 07/12/18 2024 07/13/18 0122 07/13/18 0719 07/14/18 1352  TROPONINI 0.03* <0.03 <0.03 <0.03   BNP: BNP (last 3 results) No results for input(s): BNP in the last 8760 hours.  ProBNP (last 3 results) No results for input(s): PROBNP in the last 8760 hours.  CBG: No results for input(s): GLUCAP in the last 168 hours.     Signed:  Irine Seal MD.  [facility] 07/14/2018, 2:53 PM

## 2018-07-14 NOTE — Progress Notes (Addendum)
DAILY PROGRESS NOTE   Patient Name: [name] Date of Encounter: 07/14/2018  Chief Complaint   Throat tightness overnight  Patient Profile   [name] is a 67 y.o. female with a hx of hypertension, palpitations, breast cancer s/p chemo/radiation, asthma, anxiety/depression, nonobstructive CAD, CKD, GERD, hyperlipidemia, IBS, mini stroke 2015, sleep apnea no longer using CPAP who is being seen today for the evaluation of chest pain at the request of Dr. Grandville Silos.  Subjective   Troponins negative. Mild hypokalemia today -given lasix yesterday. Negative 500 cc. BP much improved this morning at 122/80. Echo yesterday reassuring with normal function and mild diastolic dysfunction.  Objective   Vitals:   07/13/18 1309 07/13/18 1817 07/13/18 2031 07/14/18 0632  BP: (!) 165/88 (!) 175/99 140/84 122/80  Pulse: 85 72 76 74  Resp: 16 16    Temp: 98.6 F (37 C) 98.1 F (36.7 C) 98.4 F (36.9 C) 98 F (36.7 C)  TempSrc: Oral Oral Oral Oral  SpO2: 98% 96% 97% 96%  Weight:      Height:        Intake/Output Summary (Last 24 hours) at 07/14/2018 0758 Last data filed at 07/14/2018 0200 Gross per 24 hour  Intake 1002 ml  Output 1550 ml  Net -548 ml   Filed Weights   07/13/18 0105  Weight: 75.4 kg    Physical Exam   General appearance: alert and no distress Lungs: clear to auscultation bilaterally Heart: regular rate and rhythm, S1, S2 normal, no murmur, click, rub or gallop Extremities: extremities normal, atraumatic, no cyanosis or edema Neurologic: Grossly normal Psych: Mildly anxious  Inpatient Medications    Scheduled Meds: . aspirin EC  81 mg Oral Daily  . budesonide (PULMICORT) nebulizer solution  1 mg Nebulization BID  . calcium-vitamin D  1 tablet Oral Daily  . cetirizine  10 mg Oral Daily  . cycloSPORINE  1 drop Both Eyes BID  . enoxaparin (LOVENOX) injection  40 mg Subcutaneous Daily  . losartan  100 mg Oral Daily  . magnesium oxide  400 mg Oral  Daily  . metoprolol tartrate  100 mg Oral BID  . nystatin  5 mL Oral QID  . pantoprazole  40 mg Oral Daily  . pravastatin  40 mg Oral q1800  . valACYclovir  500 mg Oral BID  . vitamin B-12  1,000 mcg Oral Daily  . ascorbic acid  1,000 mg Oral Daily  . vitamin E  400 Units Oral Daily    Continuous Infusions:   PRN Meds: acetaminophen, albuterol, ALPRAZolam, diphenhydrAMINE, EPINEPHrine, ipratropium, labetalol, morphine injection, nitroGLYCERIN, ondansetron (ZOFRAN) IV, zolpidem   Labs   Results for orders placed or performed during the hospital encounter of 07/12/18 (from the past 48 hour(s))  Basic metabolic panel     Status: Abnormal   Collection Time: 07/12/18  4:43 PM  Result Value Ref Range   Sodium 139 135 - 145 mmol/L   Potassium 3.2 (L) 3.5 - 5.1 mmol/L   Chloride 104 98 - 111 mmol/L   CO2 26 22 - 32 mmol/L   Glucose, Bld 178 (H) 70 - 99 mg/dL   BUN 19 8 - 23 mg/dL   Creatinine, Ser 1.30 (H) 0.44 - 1.00 mg/dL   Calcium 10.2 8.9 - 10.3 mg/dL   GFR calc non Af Amer 42 (L) >60 mL/min   GFR calc Af Amer 49 (L) >60 mL/min    Comment: (NOTE) The eGFR has been calculated using the CKD  EPI equation. This calculation has not been validated in all clinical situations. eGFR's persistently <60 mL/min signify possible Chronic Kidney Disease.    Anion gap 9 5 - 15    Comment: Performed at [facility], [place] [address], [place], [place] [address]  CBC     Status: None   Collection Time: 07/12/18  4:43 PM  Result Value Ref Range   WBC 7.5 4.0 - 10.5 K/uL   RBC 4.24 3.87 - 5.11 MIL/uL   Hemoglobin 12.9 12.0 - 15.0 g/dL   HCT 40.3 36.0 - 46.0 %   MCV 95.0 80.0 - 100.0 fL   MCH 30.4 26.0 - 34.0 pg   MCHC 32.0 30.0 - 36.0 g/dL   RDW 13.8 11.5 - 15.5 %   Platelets 185 150 - 400 K/uL   nRBC 0.0 0.0 - 0.2 %    Comment: Performed at [facility], [place] [address] [place], [place] [address]  TSH     Status: None   Collection Time: 07/12/18   4:58 PM  Result Value Ref Range   TSH 1.196 0.350 - 4.500 uIU/mL    Comment: Performed by a 3rd Generation assay with a functional sensitivity of <=0.01 uIU/mL. Performed at [facility], [place] [address], [place], [place] [address]   D-dimer, quantitative (not at [facility])     Status: Abnormal   Collection Time: 07/12/18  4:59 PM  Result Value Ref Range   D-Dimer, Quant 1.11 (H) 0.00 - 0.50 ug/mL-FEU    Comment: (NOTE) At the manufacturer cut-off of 0.50 ug/mL FEU, this assay has been documented to exclude PE with a sensitivity and negative predictive value of 97 to 99%.  At this time, this assay has not been approved by the FDA to exclude DVT/VTE. Results should be correlated with clinical presentation. Performed at [facility], [place] [address], [place], [place] [address]   I-stat troponin, ED     Status: None   Collection Time: 07/12/18  5:28 PM  Result Value Ref Range   Troponin i, poc 0.03 0.00 - 0.08 ng/mL   Comment 3            Comment: Due to the release kinetics of cTnI, a negative result within the first hours of the onset of symptoms does not rule out myocardial infarction with certainty. If myocardial infarction is still suspected, repeat the test at appropriate intervals.   Troponin I     Status: Abnormal   Collection Time: 07/12/18  8:24 PM  Result Value Ref Range   Troponin I 0.03 (HH) <0.03 ng/mL    Comment: CRITICAL RESULT CALLED TO, READ BACK BY AND VERIFIED WITH: S.LEONARD AT 2215 ON 07/12/18 BY N.THOMPSON Performed at [facility], [place] [address] [place],  [address]   Hemoglobin A1c     Status: Abnormal   Collection Time: 07/13/18  1:22 AM  Result Value Ref Range   Hgb A1c MFr Bld 5.8 (H) 4.8 - 5.6 %    Comment: (NOTE) Pre diabetes:          5.7%-6.4% Diabetes:              >6.4% Glycemic control for   <7.0% adults with diabetes    Mean Plasma Glucose 119.76 mg/dL    Comment: Performed at  [place] [address], [place], [place] [address]  Troponin I (q 6hr x 3)     Status: None  Collection Time: 07/13/18  1:22 AM  Result Value Ref Range   Troponin I <0.03 <0.03 ng/mL    Comment: Performed at [facility], [place] [address], [place], [place] [address]  HIV antibody (Routine Testing)     Status: None   Collection Time: 07/13/18  1:22 AM  Result Value Ref Range   HIV Screen 4th Generation wRfx Non Reactive Non Reactive    Comment: (NOTE) Performed At: [facility] [place], [place] [id number] Rush Farmer MD JA:[id number]   Basic metabolic panel     Status: Abnormal   Collection Time: 07/13/18  7:18 AM  Result Value Ref Range   Sodium 143 135 - 145 mmol/L   Potassium 4.2 3.5 - 5.1 mmol/L    Comment: DELTA CHECK NOTED REPEATED TO VERIFY NO VISIBLE HEMOLYSIS    Chloride 110 98 - 111 mmol/L   CO2 25 22 - 32 mmol/L   Glucose, Bld 129 (H) 70 - 99 mg/dL   BUN 15 8 - 23 mg/dL   Creatinine, Ser 0.88 0.44 - 1.00 mg/dL   Calcium 10.1 8.9 - 10.3 mg/dL   GFR calc non Af Amer >60 >60 mL/min   GFR calc Af Amer >60 >60 mL/min    Comment: (NOTE) The eGFR has been calculated using the CKD EPI equation. This calculation has not been validated in all clinical situations. eGFR's persistently <60 mL/min signify possible Chronic Kidney Disease.    Anion gap 8 5 - 15    Comment: Performed at [facility], [place] [address] [place], [place] [address]  CBC with Differential/Platelet     Status: Abnormal   Collection Time: 07/13/18  7:18 AM  Result Value Ref Range   WBC 4.9 4.0 - 10.5 K/uL   RBC 3.83 (L) 3.87 - 5.11 MIL/uL   Hemoglobin 11.7 (L) 12.0 - 15.0 g/dL   HCT 36.8 36.0 - 46.0 %   MCV 96.1 80.0 - 100.0 fL   MCH 30.5 26.0 - 34.0 pg   MCHC 31.8 30.0 - 36.0 g/dL   RDW 14.2 11.5 - 15.5 %   Platelets 181 150 - 400 K/uL   nRBC 0.0 0.0 - 0.2 %   Neutrophils Relative % 63 %   Neutro Abs 3.1 1.7 - 7.7 K/uL     Lymphocytes Relative 28 %   Lymphs Abs 1.4 0.7 - 4.0 K/uL   Monocytes Relative 8 %   Monocytes Absolute 0.4 0.1 - 1.0 K/uL   Eosinophils Relative 1 %   Eosinophils Absolute 0.1 0.0 - 0.5 K/uL   Basophils Relative 0 %   Basophils Absolute 0.0 0.0 - 0.1 K/uL   Immature Granulocytes 0 %   Abs Immature Granulocytes 0.01 0.00 - 0.07 K/uL    Comment: Performed at [facility], [place] [address], [place], [place] [address]  Lipid panel     Status: Abnormal   Collection Time: 07/13/18  7:19 AM  Result Value Ref Range   Cholesterol 141 0 - 200 mg/dL   Triglycerides 96 <150 mg/dL   HDL 38 (L) >40 mg/dL   Total CHOL/HDL Ratio 3.7 RATIO   VLDL 19 0 - 40 mg/dL   LDL Cholesterol 84 0 - 99 mg/dL    Comment:        Total Cholesterol/HDL:CHD Risk Coronary Heart Disease Risk Table                     Men   Women  1/2 Average Risk   3.4   3.3  Average Risk       5.0   4.4  2 X Average Risk   9.6   7.1  3 X Average Risk  23.4   11.0        Use the calculated Patient Ratio above and the CHD Risk Table to determine the patient's CHD Risk.        ATP III CLASSIFICATION (LDL):  <100     mg/dL   Optimal  100-129  mg/dL   Near or Above                    Optimal  130-159  mg/dL   Borderline  160-189  mg/dL   High  >190     mg/dL   Very High Performed at [place] [address] [place], [place] [address]   Troponin I (q 6hr x 3)     Status: None   Collection Time: 07/13/18  7:19 AM  Result Value Ref Range   Troponin I <0.03 <0.03 ng/mL    Comment: Performed at [facility], [place] [address], [place], [place] [address]  Magnesium     Status: None   Collection Time: 07/13/18  7:19 AM  Result Value Ref Range   Magnesium 1.8 1.7 - 2.4 mg/dL    Comment: Performed at [facility], [place] [address] [place], [place] [address]  Urinalysis, Routine w reflex microscopic     Status: Abnormal   Collection Time: 07/13/18  9:41  AM  Result Value Ref Range   Color, Urine YELLOW YELLOW   APPearance CLEAR CLEAR   Specific Gravity, Urine 1.011 1.005 - 1.030   pH 5.0 5.0 - 8.0   Glucose, UA NEGATIVE NEGATIVE mg/dL   Hgb urine dipstick NEGATIVE NEGATIVE   Bilirubin Urine NEGATIVE NEGATIVE   Ketones, ur NEGATIVE NEGATIVE mg/dL   Protein, ur NEGATIVE NEGATIVE mg/dL   Nitrite NEGATIVE NEGATIVE   Leukocytes, UA SMALL (A) NEGATIVE   RBC / HPF 0-5 0 - 5 RBC/hpf   WBC, UA 21-50 0 - 5 WBC/hpf   Bacteria, UA MANY (A) NONE SEEN    Comment: Performed at [facility], [place] [address], [place], [place] [address]  Urine rapid drug screen (hosp performed)     Status: Abnormal   Collection Time: 07/13/18  9:43 AM  Result Value Ref Range   Opiates NONE DETECTED NONE DETECTED   Cocaine NONE DETECTED NONE DETECTED   Benzodiazepines POSITIVE (A) NONE DETECTED   Amphetamines NONE DETECTED NONE DETECTED   Tetrahydrocannabinol NONE DETECTED NONE DETECTED   Barbiturates NONE DETECTED NONE DETECTED    Comment: (NOTE) DRUG SCREEN FOR MEDICAL PURPOSES ONLY.  IF CONFIRMATION IS NEEDED FOR ANY PURPOSE, NOTIFY LAB WITHIN 5 DAYS. LOWEST DETECTABLE LIMITS FOR URINE DRUG SCREEN Drug Class                     Cutoff (ng/mL) Amphetamine and metabolites    1000 Barbiturate and metabolites    200 Benzodiazepine                 101 Tricyclics and metabolites     300 Opiates and metabolites        300 Cocaine and metabolites        300 THC  50 Performed at [facility], [place] [address] [place], [place] [address]   Magnesium     Status: None   Collection Time: 07/14/18  4:08 AM  Result Value Ref Range   Magnesium 2.0 1.7 - 2.4 mg/dL    Comment: Performed at [facility], [place] [address] [place], [place] [address]  Basic metabolic panel     Status: Abnormal   Collection Time: 07/14/18  4:08 AM  Result Value Ref Range   Sodium 141 135 - 145 mmol/L    Potassium 3.3 (L) 3.5 - 5.1 mmol/L   Chloride 105 98 - 111 mmol/L   CO2 29 22 - 32 mmol/L   Glucose, Bld 139 (H) 70 - 99 mg/dL   BUN 16 8 - 23 mg/dL   Creatinine, Ser 1.05 (H) 0.44 - 1.00 mg/dL   Calcium 10.0 8.9 - 10.3 mg/dL   GFR calc non Af Amer 55 (L) >60 mL/min   GFR calc Af Amer >60 >60 mL/min    Comment: (NOTE) The eGFR has been calculated using the CKD EPI equation. This calculation has not been validated in all clinical situations. eGFR's persistently <60 mL/min signify possible Chronic Kidney Disease.    Anion gap 7 5 - 15    Comment: Performed at [facility], [place] [address], [place], [place] [address]    ECG   N/A  Telemetry   Sinus rhythm - Personally Reviewed  Radiology    Dg Chest 2 View  Result Date: 07/12/2018 CLINICAL DATA:  Pt c/o not feeling well for several days. reports chest pressure. Pt reports that went to PCP yesterday and was started on new HTN medications. PT HX: ex smoker, asthma, ex smoker EXAM: CHEST - 2 VIEW COMPARISON:  none FINDINGS: Lungs are clear. Heart size and mediastinal contours are within normal limits. No effusion. Cervical fixation hardware partially visualized. Cholecystectomy clips. IMPRESSION: No acute cardiopulmonary disease. Electronically Signed   By: Lucrezia Europe M.D.   On: 07/12/2018 18:45   Nm Pulmonary Vent And Perf (v/q Scan)  Result Date: 07/12/2018 CLINICAL DATA:  Initial evaluation for acute chest pressure, suspected PE. EXAM: NUCLEAR MEDICINE VENTILATION - PERFUSION LUNG SCAN TECHNIQUE: Ventilation images were obtained in multiple projections using inhaled aerosol Tc-36mDTPA. Perfusion images were obtained in multiple projections after intravenous injection of Tc-975mAA. RADIOPHARMACEUTICALS:  32.3 mCi of Tc-9911mPA aerosol inhalation and 4.31 mCi Tc99m104m IV COMPARISON:  Comparison made with prior radiograph from earlier the same day. FINDINGS: Ventilation: No focal ventilation defect. Perfusion: No  wedge shaped peripheral perfusion defects to suggest acute pulmonary embolism. IMPRESSION: Negative VQ scan. No imaging findings to suggest pulmonary embolism identified. Electronically Signed   By: BenjJeannine Boga.   On: 07/12/2018 22:32    Cardiac Studies   LV EF: 60% -   65%  ------------------------------------------------------------------- Indications:      Chest pain 786.51.  ------------------------------------------------------------------- Study Conclusions  - Left ventricle: The cavity size was normal. There was mild focal   basal hypertrophy of the septum. Systolic function was normal.   The estimated ejection fraction was in the range of 60% to 65%.   Wall motion was normal; there were no regional wall motion   abnormalities. Doppler parameters are consistent with abnormal   left ventricular relaxation (grade 1 diastolic dysfunction). - Aortic valve: There was no stenosis. - Mitral valve: The findings are consistent with trivial stenosis. - Right ventricle: The cavity size was normal. Systolic function  was normal. - Pulmonary arteries: No complete TR doppler jet so unable to   estimate PA systolic pressure. - Inferior vena cava: The vessel was normal in size. The   respirophasic diameter changes were in the normal range (>= 50%),   consistent with normal central venous pressure.  Impressions:  - Normal LV size with mild focal basal septal hypertrophy. EF   60-65%. Normal RV size and systolic function. No significant   valvular abnormalities.  Assessment   1. Principal Problem: 2.   Chest pressure 3. Active Problems: 4.   Essential hypertension 5.   Coronary atherosclerosis 6.   Asthma 7.   GERD (gastroesophageal reflux disease) 8.   HLD (hyperlipidemia) 9.   Generalized anxiety disorder 10.   Hypokalemia 11.   Chronic diastolic heart failure ([place]) 12.   Stroke ([place]) 13.   Elevated troponin 14.   Acute renal failure superimposed on stage 2  chronic kidney disease ([place]) 15.   Plan   1. Bp much improved today - noted very long list of "anaphylaxis" to meds, including claritin? Suspect she is experiencing an anxious globus sensatio - known GAD. Amlodipine was very effective and would consider continuing 5 mg daily. She did not report difficulty breathing. Reassured her this is not a common side effect and the medicine is substantively different than verapamil (which she reportedly had a side effect to as well). Echo reassuring. Replete K+ today. No further suggestions at this time. Ok for d/c from a cardiac standpoint today.  [facility] will sign off.   Medication Recommendations:  Losartan 100 mg daily, metoprolol 100 mg BID, (amlodipine 5 mg daily - if she is willing to take this). Other recommendations (labs, testing, etc):  None Follow up as an outpatient:  Dr. Percival Spanish   Time Spent Directly with Patient:  I have spent a total of 15 minutes with the patient reviewing hospital notes, telemetry, EKGs, labs and examining the patient as well as establishing an assessment and plan that was discussed personally with the patient.  > 50% of time was spent in direct patient care.  Length of Stay:  LOS: 0 days   Pixie Casino, MD, [facility], [place] Director of the Advanced Lipid Disorders &  Cardiovascular Risk Reduction Clinic Diplomate of the American Board of Clinical Lipidology Attending Cardiologist  Direct Dial: [phone]  Fax: [phone]  Website:  [name] 07/14/2018, 7:58 AM

## 2018-07-16 ENCOUNTER — Telehealth: Payer: Self-pay | Admitting: Behavioral Health

## 2018-07-16 NOTE — Telephone Encounter (Signed)
Patient voiced that she's "doing better" post hospital discharge & would like to keep in place the appointment she has scheduled with Wilfred Lacy, NP on 08/07/18 at 1:00 PM. She declines TCM/Hospital follow-up at this time, but will call the office if anything changes.

## 2018-07-17 ENCOUNTER — Other Ambulatory Visit: Payer: Self-pay | Admitting: Nurse Practitioner

## 2018-07-17 MED ORDER — DOXYCYCLINE HYCLATE 100 MG PO TABS: 100.0000 mg | ORAL_TABLET | Freq: Two times a day (BID) | ORAL | 0 refills | Status: DC

## 2018-07-30 DIAGNOSIS — G4733 Obstructive sleep apnea (adult) (pediatric): Secondary | ICD-10-CM | POA: Diagnosis not present
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
## 2018-08-02 ENCOUNTER — Ambulatory Visit (INDEPENDENT_AMBULATORY_CARE_PROVIDER_SITE_OTHER): Payer: Medicare HMO | Admitting: Cardiology

## 2018-08-02 ENCOUNTER — Encounter: Payer: Self-pay | Admitting: Cardiology

## 2018-08-02 ENCOUNTER — Other Ambulatory Visit: Payer: Self-pay | Admitting: Nurse Practitioner

## 2018-08-02 VITALS — BP 142/78 | HR 68 | Ht 63.0 in | Wt 169.2 lb

## 2018-08-02 DIAGNOSIS — I10 Essential (primary) hypertension: Secondary | ICD-10-CM

## 2018-08-02 DIAGNOSIS — R079 Chest pain, unspecified: Secondary | ICD-10-CM

## 2018-08-02 DIAGNOSIS — R002 Palpitations: Secondary | ICD-10-CM | POA: Diagnosis not present

## 2018-08-02 DIAGNOSIS — F411 Generalized anxiety disorder: Secondary | ICD-10-CM

## 2018-08-02 LAB — BASIC METABOLIC PANEL
BUN/Creatinine Ratio: 21 (ref 12–28)
BUN: 20 mg/dL (ref 8–27)
CALCIUM: 10.2 mg/dL (ref 8.7–10.3)
CHLORIDE: 102 mmol/L (ref 96–106)
CO2: 25 mmol/L (ref 20–29)
Creatinine, Ser: 0.94 mg/dL (ref 0.57–1.00)
GFR calc Af Amer: 74 mL/min/{1.73_m2} (ref >59)
GFR calc non Af Amer: 64 mL/min/{1.73_m2} (ref >59)
Glucose: 100 mg/dL — ABNORMAL HIGH (ref 65–99)
POTASSIUM: 4.5 mmol/L (ref 3.5–5.2)
Sodium: 142 mmol/L (ref 134–144)

## 2018-08-02 NOTE — Patient Instructions (Signed)
Medication Instructions:  Continue current medications  If you need a refill on your cardiac medications before your next appointment, please call your pharmacy.  Labwork: BMP Today HERE IN OUR OFFICE AT [facility]  Take the provided lab slips with you to the lab for your blood draw.    You will NOT need to fast   If you have labs (blood work) drawn today and your tests are completely normal, you will receive your results only by: Marland Kitchen MyChart Message (if you have MyChart) OR . A paper copy in the mail If you have any lab test that is abnormal or we need to change your treatment, we will call you to review the results.  Testing/Procedures: None Ordered  Follow-Up: You will need a follow up appointment in 6 Months.  Please call our office 2 months in advance([phone]) to schedule the (6 Month) appointment.  You may see  DR Percival Spanish, or one of the following Advanced Practice Providers on your designated Care Team:    . Jory Sims, DNP, ANP . Rhonda Barrett, PA-C  . Kerin Ransom, [place]  . Almyra Deforest, PA-C . Fabian Sharp, PA-C  At [facility], you and your health needs are our priority.  As part of our continuing mission to provide you with exceptional heart care, we have created designated Provider Care Teams.  These Care Teams include your primary Cardiologist (physician) and Advanced Practice Providers (APPs -  Physician Assistants and Nurse Practitioners) who all work together to provide you with the care you need, when you need it.   Thank you for choosing [facility] at [facility]!!

## 2018-08-02 NOTE — Telephone Encounter (Signed)
Please advise, last refill was 04/10/18 45 tab with 2 refills. Cant get into PMP.

## 2018-08-07 ENCOUNTER — Ambulatory Visit: Payer: Self-pay | Admitting: Nurse Practitioner

## 2018-08-09 ENCOUNTER — Encounter: Payer: Self-pay | Admitting: Neurology

## 2018-08-13 DIAGNOSIS — N952 Postmenopausal atrophic vaginitis: Secondary | ICD-10-CM | POA: Diagnosis not present

## 2018-08-13 DIAGNOSIS — Z01419 Encounter for gynecological examination (general) (routine) without abnormal findings: Secondary | ICD-10-CM | POA: Diagnosis not present

## 2018-08-14 ENCOUNTER — Other Ambulatory Visit: Payer: Self-pay | Admitting: Nurse Practitioner

## 2018-08-14 DIAGNOSIS — I10 Essential (primary) hypertension: Secondary | ICD-10-CM

## 2018-08-15 ENCOUNTER — Ambulatory Visit: Payer: Self-pay | Admitting: Nurse Practitioner

## 2018-08-15 NOTE — Telephone Encounter (Signed)
[place], [place] - [place]  [place] [address]  Phone: [phone] Fax: [phone]

## 2018-08-15 NOTE — Telephone Encounter (Signed)
Medication was refilled 06/12/2018 with 3refills.

## 2018-08-15 NOTE — Telephone Encounter (Signed)
Left vm for the pt to call back, need to know what pharmacy she is using for this med?

## 2018-08-17 ENCOUNTER — Ambulatory Visit (INDEPENDENT_AMBULATORY_CARE_PROVIDER_SITE_OTHER): Payer: Medicare HMO | Admitting: Nurse Practitioner

## 2018-08-17 ENCOUNTER — Encounter: Payer: Self-pay | Admitting: Nurse Practitioner

## 2018-08-17 VITALS — BP 134/76 | HR 76 | Temp 98.7°F | Ht 63.0 in | Wt 167.0 lb

## 2018-08-17 DIAGNOSIS — J322 Chronic ethmoidal sinusitis: Secondary | ICD-10-CM

## 2018-08-17 DIAGNOSIS — J309 Allergic rhinitis, unspecified: Secondary | ICD-10-CM | POA: Diagnosis not present

## 2018-08-17 DIAGNOSIS — B370 Candidal stomatitis: Secondary | ICD-10-CM

## 2018-08-17 DIAGNOSIS — J00 Acute nasopharyngitis [common cold]: Secondary | ICD-10-CM

## 2018-08-17 MED ORDER — CETIRIZINE HCL 10 MG PO TABS: 10.0000 mg | ORAL_TABLET | Freq: Every day | ORAL | 1 refills | Status: AC

## 2018-08-17 MED ORDER — NYSTATIN 100000 UNIT/ML MT SUSP: 5.0000 mL | Freq: Four times a day (QID) | OROMUCOSAL | 1 refills | Status: DC

## 2018-08-17 NOTE — Patient Instructions (Addendum)
I do not think you need any oral antibiotics at this time.  Use atrovent nasal spray and saline sinus rinse once a day.  continue current medications.

## 2018-08-17 NOTE — Progress Notes (Signed)
Subjective:  Patient ID: [name], female    DOB: [birth year]  Age: 67 y.o. MRN: [id number]  CC: Follow-up (Maintaing her BP-has been around 130/72-tolerating medications well. Would like to discuss RX for thrush. ) and URI (headaches, sinus drainage, and ear pain. Ongoing for one day. Denies fevers has had chills. )  Sinus Problem  The current episode started yesterday. The problem has been waxing and waning since onset. There has been no fever. Associated symptoms include congestion, coughing, a hoarse voice, sinus pressure, a sore throat and swollen glands. Pertinent negatives include no chills or shortness of breath. Past treatments include nothing.  Sore Throat   This is a recurrent problem. The current episode started in the past 7 days. The problem has been waxing and waning. Associated symptoms include congestion, coughing, a hoarse voice and swollen glands. Pertinent negatives include no drooling, shortness of breath, stridor or trouble swallowing. She has had no exposure to strep or mono. She has tried nothing for the symptoms.    Reviewed past Medical, Social and Family history today.  Outpatient Medications Prior to Visit  Medication Sig Dispense Refill  . acetaminophen (TYLENOL) 500 MG tablet Take 500-1,000 mg by mouth every 6 (six) hours as needed for moderate pain.     Marland Kitchen ALPRAZolam (XANAX) 1 MG tablet TAKE 1/2 TABLET (0.5 MG) BY MOUTH 3 TIMES DAILY AS NEEDED FOR ANXIETY. NEEDS OFFICE VISIT FOR ADDITIONAL REFILLS 45 tablet 2  . amLODipine (NORVASC) 5 MG tablet Take 1 tablet (5 mg total) by mouth daily. 30 tablet 1  . ascorbic acid (VITAMIN C) 1000 MG tablet Take 1,000 mg by mouth daily.     Marland Kitchen aspirin EC 81 MG tablet Take 81 mg by mouth daily.    . benzonatate (TESSALON) 100 MG capsule Take 1 capsule (100 mg total) by mouth 3 (three) times daily as needed for cough. 60 capsule 1  . BIOTIN PO Take 1 tablet by mouth every morning. Hair Skin and Nails    . Calcium  Carbonate-Vitamin D (CALCIUM-CARB 600 + D) 600-125 MG-UNIT TABS Take 1 tablet by mouth daily.     Marland Kitchen EPINEPHrine (EPIPEN 2-PAK) 0.3 mg/0.3 mL IJ SOAJ injection Inject into the muscle.    Marland Kitchen FLOVENT HFA 110 MCG/ACT inhaler Inhale 2 puffs into the lungs 2 (two) times daily.     Marland Kitchen ipratropium (ATROVENT) 0.03 % nasal spray Place 1 spray into both nostrils 2 (two) times daily. (Patient taking differently: Place 1 spray into both nostrils 2 (two) times daily as needed (allergies). ) 30 mL 1  . losartan (COZAAR) 100 MG tablet Take 1 tablet (100 mg total) by mouth daily. 90 tablet 3  . Magnesium Oxide 400 (240 Mg) MG TABS Take 1 tablet by mouth daily.     . metoprolol tartrate (LOPRESSOR) 100 MG tablet Take 1 tablet (100 mg total) by mouth 2 (two) times daily. 180 tablet 3  . nitroGLYCERIN (NITROSTAT) 0.4 MG SL tablet Place 0.4 mg under the tongue every 5 (five) minutes as needed for chest pain.     . pantoprazole (PROTONIX) 40 MG tablet Take 1 tablet (40 mg total) by mouth daily. 90 tablet 3  . potassium chloride SA (K-DUR,KLOR-CON) 20 MEQ tablet Take 20 mEq by mouth every other day.    . pravastatin (PRAVACHOL) 40 MG tablet TAKE 1 TABLET BY MOUTH IN THE EVENING 90 tablet 3  . RESTASIS 0.05 % ophthalmic emulsion Place 1 drop into both eyes 2 (two) times  daily.  3  . valACYclovir (VALTREX) 500 MG tablet Take 500 mg by mouth daily as needed (infection).    . VENTOLIN HFA 108 (90 Base) MCG/ACT inhaler Inhale 2 puffs into the lungs every 6 (six) hours as needed for wheezing or shortness of breath.     . vitamin B-12 (CYANOCOBALAMIN) 1000 MCG tablet Take 1 tablet (1,000 mcg total) by mouth daily. 30 tablet 1  . vitamin E (VITAMIN E) 400 UNIT capsule Take 400 Units by mouth daily.    . cetirizine (ZYRTEC) 10 MG tablet Take 1 tablet (10 mg total) by mouth daily. 14 tablet 0  . ondansetron (ZOFRAN) 4 MG tablet Take 1 tablet (4 mg total) by mouth every 8 (eight) hours as needed for nausea or vomiting. 20 tablet 0    No facility-administered medications prior to visit.     ROS See HPI  Objective:  BP 134/76   Pulse 76   Temp 98.7 F (37.1 C) (Oral)   Ht 5\' 3"  (1.6 m)   Wt 167 lb (75.8 kg)   SpO2 99%   BMI 29.58 kg/m   BP Readings from Last 3 Encounters:  08/17/18 134/76  08/02/18 (!) 142/78  07/14/18 (!) 151/88    Wt Readings from Last 3 Encounters:  08/17/18 167 lb (75.8 kg)  08/02/18 169 lb 3.2 oz (76.7 kg)  07/13/18 166 lb 3.6 oz (75.4 kg)    Physical Exam  Constitutional: She is oriented to person, place, and time.  HENT:  Mouth/Throat: Uvula is midline. Posterior oropharyngeal erythema present. No oropharyngeal exudate. Tonsils are 1+ on the right. Tonsils are 1+ on the left.  Cardiovascular: Normal rate and regular rhythm.  Pulmonary/Chest: Effort normal and breath sounds normal.  Musculoskeletal: She exhibits no edema.  Lymphadenopathy:    She has no cervical adenopathy.  Neurological: She is alert and oriented to person, place, and time.  Vitals reviewed.   Lab Results  Component Value Date   WBC 4.9 07/13/2018   HGB 11.7 (L) 07/13/2018   HCT 36.8 07/13/2018   PLT 181 07/13/2018   GLUCOSE 100 (H) 08/02/2018   CHOL 141 07/13/2018   TRIG 96 07/13/2018   HDL 38 (L) 07/13/2018   LDLCALC 84 07/13/2018   ALT 9 06/12/2018   AST 13 06/12/2018   NA 142 08/02/2018   K 4.5 08/02/2018   CL 102 08/02/2018   CREATININE 0.94 08/02/2018   BUN 20 08/02/2018   CO2 25 08/02/2018   TSH 1.196 07/12/2018   INR 1.02 08/17/2013   HGBA1C 5.8 (H) 07/13/2018    Dg Chest 2 View  Result Date: 07/12/2018 CLINICAL DATA:  Pt c/o not feeling well for several days. reports chest pressure. Pt reports that went to PCP yesterday and was started on new HTN medications. PT HX: ex smoker, asthma, ex smoker EXAM: CHEST - 2 VIEW COMPARISON:  none FINDINGS: Lungs are clear. Heart size and mediastinal contours are within normal limits. No effusion. Cervical fixation hardware partially  visualized. Cholecystectomy clips. IMPRESSION: No acute cardiopulmonary disease. Electronically Signed   By: Lucrezia Europe M.D.   On: 07/12/2018 18:45   Nm Pulmonary Vent And Perf (v/q Scan)  Result Date: 07/12/2018 CLINICAL DATA:  Initial evaluation for acute chest pressure, suspected PE. EXAM: NUCLEAR MEDICINE VENTILATION - PERFUSION LUNG SCAN TECHNIQUE: Ventilation images were obtained in multiple projections using inhaled aerosol Tc-31m DTPA. Perfusion images were obtained in multiple projections after intravenous injection of Tc-90m-MAA. RADIOPHARMACEUTICALS:  32.3 mCi of Tc-51m DTPA  aerosol inhalation and 4.31 mCi Tc41m-MAA IV COMPARISON:  Comparison made with prior radiograph from earlier the same day. FINDINGS: Ventilation: No focal ventilation defect. Perfusion: No wedge shaped peripheral perfusion defects to suggest acute pulmonary embolism. IMPRESSION: Negative VQ scan. No imaging findings to suggest pulmonary embolism identified. Electronically Signed   By: Jeannine Boga M.D.   On: 07/12/2018 22:32   Vas [place] Lower Extremity Venous (dvt)  Result Date: 07/15/2018  Lower Venous Study Indications: Edema, and Multiple medical issues, positive D-dimer.  Performing Technologist: Toma Copier RVS  Examination Guidelines: A complete evaluation includes B-mode imaging, spectral Doppler, color Doppler, and power Doppler as needed of all accessible portions of each vessel. Bilateral testing is considered an integral part of a complete examination. Limited examinations for reoccurring indications may be performed as noted.  Right Venous Findings: +---------+---------------+---------+-----------+----------+-------+          CompressibilityPhasicitySpontaneityPropertiesSummary +---------+---------------+---------+-----------+----------+-------+ CFV      Full           Yes      Yes                          +---------+---------------+---------+-----------+----------+-------+ SFJ       Full                                                 +---------+---------------+---------+-----------+----------+-------+ FV Prox  Full           Yes      Yes                          +---------+---------------+---------+-----------+----------+-------+ FV Mid   Full                                                 +---------+---------------+---------+-----------+----------+-------+ FV DistalFull           Yes      Yes                          +---------+---------------+---------+-----------+----------+-------+ PFV      Full           Yes      Yes                          +---------+---------------+---------+-----------+----------+-------+ POP      Full           Yes      Yes                          +---------+---------------+---------+-----------+----------+-------+ PTV      Full                                                 +---------+---------------+---------+-----------+----------+-------+ PERO     Full                                                 +---------+---------------+---------+-----------+----------+-------+  Left Venous Findings: +---------+---------------+---------+-----------+----------+-------+          CompressibilityPhasicitySpontaneityPropertiesSummary +---------+---------------+---------+-----------+----------+-------+ CFV      Full           Yes      Yes                          +---------+---------------+---------+-----------+----------+-------+ SFJ      Full                                                 +---------+---------------+---------+-----------+----------+-------+ FV Prox  Full           Yes      Yes                          +---------+---------------+---------+-----------+----------+-------+ FV Mid   Full                                                 +---------+---------------+---------+-----------+----------+-------+ FV DistalFull           Yes      Yes                           +---------+---------------+---------+-----------+----------+-------+ PFV      Full           Yes      Yes                          +---------+---------------+---------+-----------+----------+-------+ POP      Full           Yes      Yes                          +---------+---------------+---------+-----------+----------+-------+ PTV      Full                                                 +---------+---------------+---------+-----------+----------+-------+ PERO     Full                                                 +---------+---------------+---------+-----------+----------+-------+    Summary: Right: There is no evidence of deep vein thrombosis in the lower extremity. No cystic structure found in the popliteal fossa. Left: There is no evidence of deep vein thrombosis in the lower extremity. No cystic structure found in the popliteal fossa.  *See table(s) above for measurements and observations. Electronically signed by Curt Jews MD on 07/15/2018 at 8:52:48 AM.    Final     Assessment & Plan:   Maxi was seen today for follow-up and uri.  Diagnoses and all orders for this visit:  Oral thrush -     nystatin (MYCOSTATIN) 100000 UNIT/ML suspension; Take 5 mLs (500,000 Units total) by mouth 4 (four) times daily.  Allergic  rhinitis, unspecified seasonality, unspecified trigger  Acute nasopharyngitis -     cetirizine (ZYRTEC) 10 MG tablet; Take 1 tablet (10 mg total) by mouth at bedtime.  Chronic ethmoidal sinusitis -     cetirizine (ZYRTEC) 10 MG tablet; Take 1 tablet (10 mg total) by mouth at bedtime.   I have discontinued Jeani Hawking E. Roberts's ondansetron. I have also changed her cetirizine. Additionally, I am having her start on nystatin. Lastly, I am having her maintain her Calcium Carbonate-Vitamin D, vitamin E, ascorbic acid, aspirin EC, acetaminophen, nitroGLYCERIN, BIOTIN PO, pantoprazole, RESTASIS, FLOVENT HFA, vitamin B-12, Magnesium Oxide, EPINEPHrine,  ipratropium, benzonatate, losartan, metoprolol tartrate, pravastatin, VENTOLIN HFA, potassium chloride SA, valACYclovir, amLODipine, and ALPRAZolam.  Meds ordered this encounter  Medications  . nystatin (MYCOSTATIN) 100000 UNIT/ML suspension    Sig: Take 5 mLs (500,000 Units total) by mouth 4 (four) times daily.    Dispense:  60 mL    Refill:  1    Order Specific Question:   Supervising Provider    Answer:   MATTHEWS, CODY [[id number]]  . cetirizine (ZYRTEC) 10 MG tablet    Sig: Take 1 tablet (10 mg total) by mouth at bedtime.    Dispense:  30 tablet    Refill:  1    Order Specific Question:   Supervising Provider    Answer:   MATTHEWS, CODY [[id number]]    Problem List Items Addressed This Visit      Respiratory   Allergic rhinitis   Chronic ethmoidal sinusitis (Chronic)   Relevant Medications   cetirizine (ZYRTEC) 10 MG tablet   Upper respiratory tract infection   Relevant Medications   nystatin (MYCOSTATIN) 100000 UNIT/ML suspension   cetirizine (ZYRTEC) 10 MG tablet    Other Visit Diagnoses    Oral thrush    -  Primary   Relevant Medications   nystatin (MYCOSTATIN) 100000 UNIT/ML suspension       Follow-up: Return in about 3 months (around 11/16/2018) for HTN and, hyperlipidemia.  Wilfred Lacy, NP

## 2018-08-20 ENCOUNTER — Encounter: Payer: Self-pay | Admitting: Nurse Practitioner

## 2018-08-21 ENCOUNTER — Ambulatory Visit (INDEPENDENT_AMBULATORY_CARE_PROVIDER_SITE_OTHER): Payer: Medicare HMO | Admitting: Nurse Practitioner

## 2018-08-21 ENCOUNTER — Ambulatory Visit (INDEPENDENT_AMBULATORY_CARE_PROVIDER_SITE_OTHER): Payer: Medicare HMO

## 2018-08-21 ENCOUNTER — Encounter: Payer: Self-pay | Admitting: Nurse Practitioner

## 2018-08-21 VITALS — BP 148/76 | HR 99 | Temp 101.9°F | Ht 63.0 in | Wt 167.0 lb

## 2018-08-21 DIAGNOSIS — J4531 Mild persistent asthma with (acute) exacerbation: Secondary | ICD-10-CM | POA: Diagnosis not present

## 2018-08-21 DIAGNOSIS — R05 Cough: Secondary | ICD-10-CM | POA: Diagnosis not present

## 2018-08-21 DIAGNOSIS — R509 Fever, unspecified: Secondary | ICD-10-CM

## 2018-08-21 MED ORDER — PROMETHAZINE-DM 6.25-15 MG/5ML PO SYRP: 5.0000 mL | ORAL_SOLUTION | Freq: Three times a day (TID) | ORAL | 0 refills | Status: DC | PRN

## 2018-08-21 MED ORDER — ALBUTEROL SULFATE (2.5 MG/3ML) 0.083% IN NEBU
2.5000 mg | INHALATION_SOLUTION | Freq: Once | RESPIRATORY_TRACT | Status: AC
  Administered 2018-08-21: 2.5 mg via RESPIRATORY_TRACT

## 2018-08-21 MED ORDER — DOXYCYCLINE HYCLATE 100 MG PO TABS: 100.0000 mg | ORAL_TABLET | Freq: Two times a day (BID) | ORAL | 0 refills | Status: AC

## 2018-08-21 NOTE — Patient Instructions (Addendum)
No acute finding on CXR Start doxycycline and promethazine-DM  Use ventolin every 8hrs x 2days, then as needed.  Maintain adequate oral hydration.  Alternate between tylenol and ibuprofen for bodyaches and fever.  She is to return to office if no improvement in 3days.

## 2018-08-21 NOTE — Progress Notes (Signed)
Subjective:  Patient ID: [name], female    DOB: Dec 28, 1950  Age: 67 y.o. MRN: [id number]  CC: Cough (pt is complaining of fever,bodyache,ears pain,coghing yellow up/ 1 day/)   Fever   This is a new problem. The current episode started in the past 7 days. The problem occurs intermittently. The problem has been waxing and waning. The maximum temperature noted was 100 to 100.9 F. Associated symptoms include chest pain, congestion, coughing, muscle aches and wheezing. She has tried acetaminophen for the symptoms. The treatment provided no relief.  Risk factors: sick contacts   Risk factors: no recent travel    Reviewed past Medical, Social and Family history today.  Outpatient Medications Prior to Visit  Medication Sig Dispense Refill  . acetaminophen (TYLENOL) 500 MG tablet Take 500-1,000 mg by mouth every 6 (six) hours as needed for moderate pain.     Marland Kitchen ALPRAZolam (XANAX) 1 MG tablet TAKE 1/2 TABLET (0.5 MG) BY MOUTH 3 TIMES DAILY AS NEEDED FOR ANXIETY. NEEDS OFFICE VISIT FOR ADDITIONAL REFILLS 45 tablet 2  . amLODipine (NORVASC) 5 MG tablet Take 1 tablet (5 mg total) by mouth daily. 30 tablet 1  . ascorbic acid (VITAMIN C) 1000 MG tablet Take 1,000 mg by mouth daily.     Marland Kitchen aspirin EC 81 MG tablet Take 81 mg by mouth daily.    . benzonatate (TESSALON) 100 MG capsule Take 1 capsule (100 mg total) by mouth 3 (three) times daily as needed for cough. 60 capsule 1  . BIOTIN PO Take 1 tablet by mouth every morning. Hair Skin and Nails    . Calcium Carbonate-Vitamin D (CALCIUM-CARB 600 + D) 600-125 MG-UNIT TABS Take 1 tablet by mouth daily.     . cetirizine (ZYRTEC) 10 MG tablet Take 1 tablet (10 mg total) by mouth at bedtime. 30 tablet 1  . EPINEPHrine (EPIPEN 2-PAK) 0.3 mg/0.3 mL IJ SOAJ injection Inject into the muscle.    Marland Kitchen FLOVENT HFA 110 MCG/ACT inhaler Inhale 2 puffs into the lungs 2 (two) times daily.     Marland Kitchen ipratropium (ATROVENT) 0.03 % nasal spray Place 1 spray into both nostrils  2 (two) times daily. (Patient taking differently: Place 1 spray into both nostrils 2 (two) times daily as needed (allergies). ) 30 mL 1  . losartan (COZAAR) 100 MG tablet Take 1 tablet (100 mg total) by mouth daily. 90 tablet 3  . Magnesium Oxide 400 (240 Mg) MG TABS Take 1 tablet by mouth daily.     . metoprolol tartrate (LOPRESSOR) 100 MG tablet Take 1 tablet (100 mg total) by mouth 2 (two) times daily. 180 tablet 3  . nitroGLYCERIN (NITROSTAT) 0.4 MG SL tablet Place 0.4 mg under the tongue every 5 (five) minutes as needed for chest pain.     Marland Kitchen nystatin (MYCOSTATIN) 100000 UNIT/ML suspension Take 5 mLs (500,000 Units total) by mouth 4 (four) times daily. 60 mL 1  . pantoprazole (PROTONIX) 40 MG tablet Take 1 tablet (40 mg total) by mouth daily. 90 tablet 3  . potassium chloride SA (K-DUR,KLOR-CON) 20 MEQ tablet Take 20 mEq by mouth every other day.    . pravastatin (PRAVACHOL) 40 MG tablet TAKE 1 TABLET BY MOUTH IN THE EVENING 90 tablet 3  . RESTASIS 0.05 % ophthalmic emulsion Place 1 drop into both eyes 2 (two) times daily.  3  . valACYclovir (VALTREX) 500 MG tablet Take 500 mg by mouth daily as needed (infection).    [place] Cutter HFA  108 (90 Base) MCG/ACT inhaler Inhale 2 puffs into the lungs every 6 (six) hours as needed for wheezing or shortness of breath.     . vitamin B-12 (CYANOCOBALAMIN) 1000 MCG tablet Take 1 tablet (1,000 mcg total) by mouth daily. 30 tablet 1  . vitamin E (VITAMIN E) 400 UNIT capsule Take 400 Units by mouth daily.     No facility-administered medications prior to visit.     ROS See HPI  Objective:  BP (!) 148/76   Pulse 99   Temp (!) 101.9 F (38.8 C) (Oral)   Ht 5\' 3"  (1.6 m)   Wt 167 lb (75.8 kg)   SpO2 96%   BMI 29.58 kg/m   BP Readings from Last 3 Encounters:  08/21/18 (!) 148/76  08/17/18 134/76  08/02/18 (!) 142/78    Wt Readings from Last 3 Encounters:  08/21/18 167 lb (75.8 kg)  08/17/18 167 lb (75.8 kg)  08/02/18 169 lb 3.2 oz (76.7 kg)     Physical Exam  Lab Results  Component Value Date   WBC 4.9 07/13/2018   HGB 11.7 (L) 07/13/2018   HCT 36.8 07/13/2018   PLT 181 07/13/2018   GLUCOSE 100 (H) 08/02/2018   CHOL 141 07/13/2018   TRIG 96 07/13/2018   HDL 38 (L) 07/13/2018   LDLCALC 84 07/13/2018   ALT 9 06/12/2018   AST 13 06/12/2018   NA 142 08/02/2018   K 4.5 08/02/2018   CL 102 08/02/2018   CREATININE 0.94 08/02/2018   BUN 20 08/02/2018   CO2 25 08/02/2018   TSH 1.196 07/12/2018   INR 1.02 08/17/2013   HGBA1C 5.8 (H) 07/13/2018    Dg Chest 2 View  Result Date: 07/12/2018 CLINICAL DATA:  Pt c/o not feeling well for several days. reports chest pressure. Pt reports that went to PCP yesterday and was started on new HTN medications. PT HX: ex smoker, asthma, ex smoker EXAM: CHEST - 2 VIEW COMPARISON:  none FINDINGS: Lungs are clear. Heart size and mediastinal contours are within normal limits. No effusion. Cervical fixation hardware partially visualized. Cholecystectomy clips. IMPRESSION: No acute cardiopulmonary disease. Electronically Signed   By: Lucrezia Europe M.D.   On: 07/12/2018 18:45   Nm Pulmonary Vent And Perf (v/q Scan)  Result Date: 07/12/2018 CLINICAL DATA:  Initial evaluation for acute chest pressure, suspected PE. EXAM: NUCLEAR MEDICINE VENTILATION - PERFUSION LUNG SCAN TECHNIQUE: Ventilation images were obtained in multiple projections using inhaled aerosol Tc-69m DTPA. Perfusion images were obtained in multiple projections after intravenous injection of Tc-33m-MAA. RADIOPHARMACEUTICALS:  32.3 mCi of Tc-36m DTPA aerosol inhalation and 4.31 mCi Tc63m-MAA IV COMPARISON:  Comparison made with prior radiograph from earlier the same day. FINDINGS: Ventilation: No focal ventilation defect. Perfusion: No wedge shaped peripheral perfusion defects to suggest acute pulmonary embolism. IMPRESSION: Negative VQ scan. No imaging findings to suggest pulmonary embolism identified. Electronically Signed   By: Jeannine Boga M.D.   On: 07/12/2018 22:32   Vas [place] Lower Extremity Venous (dvt)  Result Date: 07/15/2018  Lower Venous Study Indications: Edema, and Multiple medical issues, positive D-dimer.  Performing Technologist: Toma Copier RVS  Examination Guidelines: A complete evaluation includes B-mode imaging, spectral Doppler, color Doppler, and power Doppler as needed of all accessible portions of each vessel. Bilateral testing is considered an integral part of a complete examination. Limited examinations for reoccurring indications may be performed as noted.  Right Venous Findings: +---------+---------------+---------+-----------+----------+-------+          CompressibilityPhasicitySpontaneityPropertiesSummary +---------+---------------+---------+-----------+----------+-------+  CFV      Full           Yes      Yes                          +---------+---------------+---------+-----------+----------+-------+ SFJ      Full                                                 +---------+---------------+---------+-----------+----------+-------+ FV Prox  Full           Yes      Yes                          +---------+---------------+---------+-----------+----------+-------+ FV Mid   Full                                                 +---------+---------------+---------+-----------+----------+-------+ FV DistalFull           Yes      Yes                          +---------+---------------+---------+-----------+----------+-------+ PFV      Full           Yes      Yes                          +---------+---------------+---------+-----------+----------+-------+ POP      Full           Yes      Yes                          +---------+---------------+---------+-----------+----------+-------+ PTV      Full                                                 +---------+---------------+---------+-----------+----------+-------+ PERO     Full                                                  +---------+---------------+---------+-----------+----------+-------+  Left Venous Findings: +---------+---------------+---------+-----------+----------+-------+          CompressibilityPhasicitySpontaneityPropertiesSummary +---------+---------------+---------+-----------+----------+-------+ CFV      Full           Yes      Yes                          +---------+---------------+---------+-----------+----------+-------+ SFJ      Full                                                 +---------+---------------+---------+-----------+----------+-------+ FV Prox  Full  Yes      Yes                          +---------+---------------+---------+-----------+----------+-------+ FV Mid   Full                                                 +---------+---------------+---------+-----------+----------+-------+ FV DistalFull           Yes      Yes                          +---------+---------------+---------+-----------+----------+-------+ PFV      Full           Yes      Yes                          +---------+---------------+---------+-----------+----------+-------+ POP      Full           Yes      Yes                          +---------+---------------+---------+-----------+----------+-------+ PTV      Full                                                 +---------+---------------+---------+-----------+----------+-------+ PERO     Full                                                 +---------+---------------+---------+-----------+----------+-------+    Summary: Right: There is no evidence of deep vein thrombosis in the lower extremity. No cystic structure found in the popliteal fossa. Left: There is no evidence of deep vein thrombosis in the lower extremity. No cystic structure found in the popliteal fossa.  *See table(s) above for measurements and observations. Electronically signed by Curt Jews MD on 07/15/2018 at 8:52:48  AM.    Final     Assessment & Plan:   Kristelle was seen today for cough.  Diagnoses and all orders for this visit:  Fever and chills -     DG Chest 2 View -     doxycycline (VIBRA-TABS) 100 MG tablet; Take 1 tablet (100 mg total) by mouth 2 (two) times daily for 7 days.  Mild persistent asthma with acute exacerbation -     albuterol (PROVENTIL) (2.5 MG/3ML) 0.083% nebulizer solution 2.5 mg -     promethazine-dextromethorphan (PROMETHAZINE-DM) 6.25-15 MG/5ML syrup; Take 5 mLs by mouth 3 (three) times daily as needed for cough.  Other orders -     Cancel: POCT Influenza A/B   I am having Mitchel Honour start on doxycycline and promethazine-dextromethorphan. I am also having her maintain her Calcium Carbonate-Vitamin D, vitamin E, ascorbic acid, aspirin EC, acetaminophen, nitroGLYCERIN, BIOTIN PO, pantoprazole, RESTASIS, FLOVENT HFA, vitamin B-12, Magnesium Oxide, EPINEPHrine, ipratropium, benzonatate, losartan, metoprolol tartrate, pravastatin, VENTOLIN HFA, potassium chloride SA, valACYclovir, amLODipine, ALPRAZolam, nystatin, and cetirizine. We administered albuterol.  Meds ordered this encounter  Medications  .  doxycycline (VIBRA-TABS) 100 MG tablet    Sig: Take 1 tablet (100 mg total) by mouth 2 (two) times daily for 7 days.    Dispense:  14 tablet    Refill:  0    Order Specific Question:   Supervising Provider    Answer:   MATTHEWS, CODY [[id number]]  . albuterol (PROVENTIL) (2.5 MG/3ML) 0.083% nebulizer solution 2.5 mg  . promethazine-dextromethorphan (PROMETHAZINE-DM) 6.25-15 MG/5ML syrup    Sig: Take 5 mLs by mouth 3 (three) times daily as needed for cough.    Dispense:  120 mL    Refill:  0    Order Specific Question:   Supervising Provider    Answer:   MATTHEWS, CODY [[id number]]    Problem List Items Addressed This Visit      Respiratory   Asthma (Chronic)   Relevant Medications   albuterol (PROVENTIL) (2.5 MG/3ML) 0.083% nebulizer solution 2.5 mg (Completed)    promethazine-dextromethorphan (PROMETHAZINE-DM) 6.25-15 MG/5ML syrup    Other Visit Diagnoses    Fever and chills    -  Primary   Relevant Medications   doxycycline (VIBRA-TABS) 100 MG tablet   Other Relevant Orders   DG Chest 2 View (Completed)       Follow-up: No follow-ups on file.  Wilfred Lacy, NP

## 2018-08-22 ENCOUNTER — Encounter: Payer: Self-pay | Admitting: Nurse Practitioner

## 2018-09-17 ENCOUNTER — Ambulatory Visit (INDEPENDENT_AMBULATORY_CARE_PROVIDER_SITE_OTHER): Payer: Medicare HMO | Admitting: Nurse Practitioner

## 2018-09-17 ENCOUNTER — Telehealth: Payer: Self-pay | Admitting: Cardiology

## 2018-09-17 ENCOUNTER — Encounter: Payer: Self-pay | Admitting: Nurse Practitioner

## 2018-09-17 VITALS — BP 138/82 | HR 69 | Temp 98.4°F | Ht 63.0 in | Wt 174.4 lb

## 2018-09-17 DIAGNOSIS — J322 Chronic ethmoidal sinusitis: Secondary | ICD-10-CM

## 2018-09-17 DIAGNOSIS — J4541 Moderate persistent asthma with (acute) exacerbation: Secondary | ICD-10-CM | POA: Diagnosis not present

## 2018-09-17 MED ORDER — MONTELUKAST SODIUM 10 MG PO TABS: 10.0000 mg | ORAL_TABLET | Freq: Every day | ORAL | 3 refills | Status: DC

## 2018-09-17 MED ORDER — BUDESONIDE-FORMOTEROL FUMARATE 160-4.5 MCG/ACT IN AERO: 1.0000 | INHALATION_SPRAY | Freq: Two times a day (BID) | RESPIRATORY_TRACT | 3 refills | Status: DC

## 2018-09-17 MED ORDER — AMLODIPINE BESYLATE 5 MG PO TABS: 5.0000 mg | ORAL_TABLET | Freq: Every day | ORAL | 3 refills | Status: DC

## 2018-09-17 MED ORDER — GUAIFENESIN ER 600 MG PO TB12: 600.0000 mg | ORAL_TABLET | Freq: Two times a day (BID) | ORAL | 0 refills | Status: DC | PRN

## 2018-09-17 NOTE — Patient Instructions (Addendum)
Stop flovent. Start symbicort 1puff twice a day (rinse mouth after each use) Start singulair at bedtime.  You will be contacted to schedule appt with pulmonology.  Use albuterol 2puffs every8hrs x 3days, then go back to as needed.  Start saline sinus rinse once a day.

## 2018-09-17 NOTE — Telephone Encounter (Signed)
Rx(s) sent to pharmacy electronically.  

## 2018-09-17 NOTE — Telephone Encounter (Signed)
New Message    *STAT* If patient is at the pharmacy, call can be transferred to refill team.   1. Which medications need to be refilled? (please list name of each medication and dose if known) amLODipine (NORVASC) 5 MG tablet   2. Which pharmacy/location (including street and city if local pharmacy) is medication to be sent to? [place], [place]  3. Do they need a 30 day or 90 day supply? 90 day supply

## 2018-09-17 NOTE — Progress Notes (Signed)
Subjective:  Patient ID: [name], female    DOB: November 03, 1950  Age: 68 y.o. MRN: [id number]  CC: Cough (productive cough, green mucus, congestion, headache, real dizzy, ear pressure/ started 14 days/ put peroxide in ears/ got blood wax out ears )  Cough  This is a chronic problem. The current episode started more than 1 year ago. The problem has been waxing and waning. The problem occurs constantly. The cough is productive of purulent sputum. Associated symptoms include headaches, nasal congestion, postnasal drip, rhinorrhea, a sore throat, shortness of breath and wheezing. Pertinent negatives include no chills. She has tried OTC cough suppressant, a beta-agonist inhaler and steroid inhaler for the symptoms. The treatment provided mild relief. Her past medical history is significant for asthma, bronchitis and environmental allergies.  oral abx completed 3weeks ago.  Reviewed past Medical, Social and Family history today.  Outpatient Medications Prior to Visit  Medication Sig Dispense Refill  . acetaminophen (TYLENOL) 500 MG tablet Take 500-1,000 mg by mouth every 6 (six) hours as needed for moderate pain.     Marland Kitchen ALPRAZolam (XANAX) 1 MG tablet TAKE 1/2 TABLET (0.5 MG) BY MOUTH 3 TIMES DAILY AS NEEDED FOR ANXIETY. NEEDS OFFICE VISIT FOR ADDITIONAL REFILLS 45 tablet 2  . amLODipine (NORVASC) 5 MG tablet Take 1 tablet (5 mg total) by mouth daily. 90 tablet 3  . ascorbic acid (VITAMIN C) 1000 MG tablet Take 1,000 mg by mouth daily.     Marland Kitchen aspirin EC 81 MG tablet Take 81 mg by mouth daily.    . benzonatate (TESSALON) 100 MG capsule Take 1 capsule (100 mg total) by mouth 3 (three) times daily as needed for cough. 60 capsule 1  . BIOTIN PO Take 1 tablet by mouth every morning. Hair Skin and Nails    . Calcium Carbonate-Vitamin D (CALCIUM-CARB 600 + D) 600-125 MG-UNIT TABS Take 1 tablet by mouth daily.     . cetirizine (ZYRTEC) 10 MG tablet Take 1 tablet (10 mg total) by mouth at bedtime. 30 tablet  1  . losartan (COZAAR) 100 MG tablet Take 1 tablet (100 mg total) by mouth daily. 90 tablet 3  . Magnesium Oxide 400 (240 Mg) MG TABS Take 1 tablet by mouth daily.     . metoprolol tartrate (LOPRESSOR) 100 MG tablet Take 1 tablet (100 mg total) by mouth 2 (two) times daily. 180 tablet 3  . nitroGLYCERIN (NITROSTAT) 0.4 MG SL tablet Place 0.4 mg under the tongue every 5 (five) minutes as needed for chest pain.     Marland Kitchen nystatin (MYCOSTATIN) 100000 UNIT/ML suspension Take 5 mLs (500,000 Units total) by mouth 4 (four) times daily. 60 mL 1  . pantoprazole (PROTONIX) 40 MG tablet Take 1 tablet (40 mg total) by mouth daily. 90 tablet 3  . potassium chloride SA (K-DUR,KLOR-CON) 20 MEQ tablet Take 20 mEq by mouth every other day.    . pravastatin (PRAVACHOL) 40 MG tablet TAKE 1 TABLET BY MOUTH IN THE EVENING 90 tablet 3  . promethazine-dextromethorphan (PROMETHAZINE-DM) 6.25-15 MG/5ML syrup Take 5 mLs by mouth 3 (three) times daily as needed for cough. 120 mL 0  . RESTASIS 0.05 % ophthalmic emulsion Place 1 drop into both eyes 2 (two) times daily.  3  . valACYclovir (VALTREX) 500 MG tablet Take 500 mg by mouth daily as needed (infection).    . VENTOLIN HFA 108 (90 Base) MCG/ACT inhaler Inhale 2 puffs into the lungs every 6 (six) hours as needed for wheezing or  shortness of breath.     . vitamin B-12 (CYANOCOBALAMIN) 1000 MCG tablet Take 1 tablet (1,000 mcg total) by mouth daily. 30 tablet 1  . vitamin E (VITAMIN E) 400 UNIT capsule Take 400 Units by mouth daily.    Marland Kitchen FLOVENT HFA 110 MCG/ACT inhaler Inhale 2 puffs into the lungs 2 (two) times daily.     Marland Kitchen ipratropium (ATROVENT) 0.03 % nasal spray Place 1 spray into both nostrils 2 (two) times daily. (Patient taking differently: Place 1 spray into both nostrils 2 (two) times daily as needed (allergies). ) 30 mL 1  . EPINEPHrine (EPIPEN 2-PAK) 0.3 mg/0.3 mL IJ SOAJ injection Inject into the muscle.     No facility-administered medications prior to visit.      ROS See HPI  Objective:  BP 138/82   Pulse 69   Temp 98.4 F (36.9 C) (Oral)   Ht 5\' 3"  (1.6 m)   Wt 174 lb 6.4 oz (79.1 kg)   SpO2 96%   BMI 30.89 kg/m   BP Readings from Last 3 Encounters:  09/17/18 138/82  08/21/18 (!) 148/76  08/17/18 134/76   Wt Readings from Last 3 Encounters:  09/17/18 174 lb 6.4 oz (79.1 kg)  08/21/18 167 lb (75.8 kg)  08/17/18 167 lb (75.8 kg)   Peak flow meter: 180, 280, 355ml/min.  Physical Exam Vitals signs reviewed.  HENT:     Right Ear: Tympanic membrane, ear canal and external ear normal.     Left Ear: Tympanic membrane, ear canal and external ear normal.     Nose: Rhinorrhea present. No mucosal edema.     Right Sinus: Maxillary sinus tenderness and frontal sinus tenderness present.     Left Sinus: Maxillary sinus tenderness and frontal sinus tenderness present.     Mouth/Throat:     Pharynx: Posterior oropharyngeal erythema present.  Cardiovascular:     Rate and Rhythm: Normal rate and regular rhythm.  Pulmonary:     Effort: Pulmonary effort is normal.     Breath sounds: Wheezing present.  Neurological:     Mental Status: She is alert and oriented to person, place, and time.     Lab Results  Component Value Date   WBC 4.9 07/13/2018   HGB 11.7 (L) 07/13/2018   HCT 36.8 07/13/2018   PLT 181 07/13/2018   GLUCOSE 100 (H) 08/02/2018   CHOL 141 07/13/2018   TRIG 96 07/13/2018   HDL 38 (L) 07/13/2018   LDLCALC 84 07/13/2018   ALT 9 06/12/2018   AST 13 06/12/2018   NA 142 08/02/2018   K 4.5 08/02/2018   CL 102 08/02/2018   CREATININE 0.94 08/02/2018   BUN 20 08/02/2018   CO2 25 08/02/2018   TSH 1.196 07/12/2018   INR 1.02 08/17/2013   HGBA1C 5.8 (H) 07/13/2018    Dg Chest 2 View  Result Date: 07/12/2018 CLINICAL DATA:  Pt c/o not feeling well for several days. reports chest pressure. Pt reports that went to PCP yesterday and was started on new HTN medications. PT HX: ex smoker, asthma, ex smoker EXAM: CHEST - 2  VIEW COMPARISON:  none FINDINGS: Lungs are clear. Heart size and mediastinal contours are within normal limits. No effusion. Cervical fixation hardware partially visualized. Cholecystectomy clips. IMPRESSION: No acute cardiopulmonary disease. Electronically Signed   By: Lucrezia Europe M.D.   On: 07/12/2018 18:45   Nm Pulmonary Vent And Perf (v/q Scan)  Result Date: 07/12/2018 CLINICAL DATA:  Initial evaluation for acute chest  pressure, suspected PE. EXAM: NUCLEAR MEDICINE VENTILATION - PERFUSION LUNG SCAN TECHNIQUE: Ventilation images were obtained in multiple projections using inhaled aerosol Tc-72m DTPA. Perfusion images were obtained in multiple projections after intravenous injection of Tc-75m-MAA. RADIOPHARMACEUTICALS:  32.3 mCi of Tc-15m DTPA aerosol inhalation and 4.31 mCi Tc37m-MAA IV COMPARISON:  Comparison made with prior radiograph from earlier the same day. FINDINGS: Ventilation: No focal ventilation defect. Perfusion: No wedge shaped peripheral perfusion defects to suggest acute pulmonary embolism. IMPRESSION: Negative VQ scan. No imaging findings to suggest pulmonary embolism identified. Electronically Signed   By: Jeannine Boga M.D.   On: 07/12/2018 22:32   Vas [place] Lower Extremity Venous (dvt)  Result Date: 07/15/2018  Lower Venous Study Indications: Edema, and Multiple medical issues, positive D-dimer.  Performing Technologist: Toma Copier RVS  Examination Guidelines: A complete evaluation includes B-mode imaging, spectral Doppler, color Doppler, and power Doppler as needed of all accessible portions of each vessel. Bilateral testing is considered an integral part of a complete examination. Limited examinations for reoccurring indications may be performed as noted.  Right Venous Findings: +---------+---------------+---------+-----------+----------+-------+          CompressibilityPhasicitySpontaneityPropertiesSummary  +---------+---------------+---------+-----------+----------+-------+ CFV      Full           Yes      Yes                          +---------+---------------+---------+-----------+----------+-------+ SFJ      Full                                                 +---------+---------------+---------+-----------+----------+-------+ FV Prox  Full           Yes      Yes                          +---------+---------------+---------+-----------+----------+-------+ FV Mid   Full                                                 +---------+---------------+---------+-----------+----------+-------+ FV DistalFull           Yes      Yes                          +---------+---------------+---------+-----------+----------+-------+ PFV      Full           Yes      Yes                          +---------+---------------+---------+-----------+----------+-------+ POP      Full           Yes      Yes                          +---------+---------------+---------+-----------+----------+-------+ PTV      Full                                                 +---------+---------------+---------+-----------+----------+-------+  PERO     Full                                                 +---------+---------------+---------+-----------+----------+-------+  Left Venous Findings: +---------+---------------+---------+-----------+----------+-------+          CompressibilityPhasicitySpontaneityPropertiesSummary +---------+---------------+---------+-----------+----------+-------+ CFV      Full           Yes      Yes                          +---------+---------------+---------+-----------+----------+-------+ SFJ      Full                                                 +---------+---------------+---------+-----------+----------+-------+ FV Prox  Full           Yes      Yes                           +---------+---------------+---------+-----------+----------+-------+ FV Mid   Full                                                 +---------+---------------+---------+-----------+----------+-------+ FV DistalFull           Yes      Yes                          +---------+---------------+---------+-----------+----------+-------+ PFV      Full           Yes      Yes                          +---------+---------------+---------+-----------+----------+-------+ POP      Full           Yes      Yes                          +---------+---------------+---------+-----------+----------+-------+ PTV      Full                                                 +---------+---------------+---------+-----------+----------+-------+ PERO     Full                                                 +---------+---------------+---------+-----------+----------+-------+    Summary: Right: There is no evidence of deep vein thrombosis in the lower extremity. No cystic structure found in the popliteal fossa. Left: There is no evidence of deep vein thrombosis in the lower extremity. No cystic structure found in the popliteal fossa.  *See table(s) above for measurements and observations. Electronically signed by Curt Jews MD on 07/15/2018 at 8:52:48 AM.  Final     Assessment & Plan:   [name] was seen today for cough.  Diagnoses and all orders for this visit:  Chronic ethmoidal sinusitis -     montelukast (SINGULAIR) 10 MG tablet; Take 1 tablet (10 mg total) by mouth at bedtime. -     guaiFENesin (MUCINEX) 600 MG 12 hr tablet; Take 1 tablet (600 mg total) by mouth 2 (two) times daily as needed for cough or to loosen phlegm.  Moderate persistent asthma with acute exacerbation -     Peak flow meter -     Ambulatory referral to Pulmonology -     guaiFENesin (MUCINEX) 600 MG 12 hr tablet; Take 1 tablet (600 mg total) by mouth 2 (two) times daily as needed for cough or to loosen phlegm.  Other  orders -     budesonide-formoterol (SYMBICORT) 160-4.5 MCG/ACT inhaler; Inhale 1 puff into the lungs 2 (two) times daily. Rinse mouth after each use   I have discontinued Jeani Hawking E. Roberts's FLOVENT HFA and ipratropium. I am also having her start on montelukast, budesonide-formoterol, and guaiFENesin. Additionally, I am having her maintain her Calcium Carbonate-Vitamin D, vitamin E, ascorbic acid, aspirin EC, acetaminophen, nitroGLYCERIN, BIOTIN PO, pantoprazole, RESTASIS, vitamin B-12, Magnesium Oxide, EPINEPHrine, benzonatate, losartan, metoprolol tartrate, pravastatin, VENTOLIN HFA, potassium chloride SA, valACYclovir, ALPRAZolam, nystatin, cetirizine, promethazine-dextromethorphan, and amLODipine.  Meds ordered this encounter  Medications  . montelukast (SINGULAIR) 10 MG tablet    Sig: Take 1 tablet (10 mg total) by mouth at bedtime.    Dispense:  30 tablet    Refill:  3    Order Specific Question:   Supervising Provider    Answer:   Lucille Passy [[id number]]  . budesonide-formoterol (SYMBICORT) 160-4.5 MCG/ACT inhaler    Sig: Inhale 1 puff into the lungs 2 (two) times daily. Rinse mouth after each use    Dispense:  1 Inhaler    Refill:  3    Order Specific Question:   Supervising Provider    Answer:   Lucille Passy [[id number]]  . guaiFENesin (MUCINEX) 600 MG 12 hr tablet    Sig: Take 1 tablet (600 mg total) by mouth 2 (two) times daily as needed for cough or to loosen phlegm.    Dispense:  14 tablet    Refill:  0    Order Specific Question:   Supervising Provider    Answer:   Lucille Passy [[id number]]    Problem List Items Addressed This Visit      Respiratory   Asthma (Chronic)   Relevant Medications   montelukast (SINGULAIR) 10 MG tablet   budesonide-formoterol (SYMBICORT) 160-4.5 MCG/ACT inhaler   guaiFENesin (MUCINEX) 600 MG 12 hr tablet   Other Relevant Orders   Peak flow meter   Ambulatory referral to Pulmonology   Chronic ethmoidal sinusitis - Primary (Chronic)   Relevant  Medications   montelukast (SINGULAIR) 10 MG tablet   guaiFENesin (MUCINEX) 600 MG 12 hr tablet       Follow-up: No follow-ups on file.  Wilfred Lacy, NP

## 2018-09-20 ENCOUNTER — Encounter: Payer: Self-pay | Admitting: Nurse Practitioner

## 2018-10-04 ENCOUNTER — Ambulatory Visit (INDEPENDENT_AMBULATORY_CARE_PROVIDER_SITE_OTHER): Payer: Medicare HMO | Admitting: Psychiatry

## 2018-10-04 DIAGNOSIS — F4323 Adjustment disorder with mixed anxiety and depressed mood: Secondary | ICD-10-CM

## 2018-10-08 ENCOUNTER — Telehealth: Payer: Self-pay

## 2018-10-08 DIAGNOSIS — F411 Generalized anxiety disorder: Secondary | ICD-10-CM

## 2018-10-08 MED ORDER — ALPRAZOLAM 1 MG PO TABS: 1.0000 mg | ORAL_TABLET | Freq: Three times a day (TID) | ORAL | 0 refills | Status: DC | PRN

## 2018-10-08 NOTE — Addendum Note (Signed)
Addended by: Wilfred Lacy L on: 10/08/2018 01:31 PM   Modules accepted: Orders

## 2018-10-08 NOTE — Telephone Encounter (Signed)
Copied from [place] [id number]. Topic: General - Inquiry >> Oct 08, 2018  8:49 AM Conception Chancy, NT wrote: Reason for CRM: patient is calling and states her mother is passing away and she is wanting to speak with Wilfred Lacy in regards to her xanax.

## 2018-10-08 NOTE — Telephone Encounter (Signed)
Spoke with the pt, she said her mother is in hospice and they are giving her a week left. [name] is having a heard time going this process right now and she has been using Xanax more often that she normally do. She has 1 more refill left (due to pick up on 10/13/2018). Pt just want Baldo Ash to know this is going on and she might request more med just in case its runs out. FYI

## 2018-10-10 NOTE — Telephone Encounter (Signed)
Left vm for the pt to call back, need to inform the pt of [facility] message.   Pt to stay on Xanax schedule but if she needs more refill, [facility] sent in one time Rx for get the pt through with this situation.

## 2018-10-10 NOTE — Telephone Encounter (Signed)
Pt is aware.  

## 2018-10-17 ENCOUNTER — Encounter: Payer: Self-pay | Admitting: Pulmonary Disease

## 2018-10-17 ENCOUNTER — Ambulatory Visit (INDEPENDENT_AMBULATORY_CARE_PROVIDER_SITE_OTHER): Payer: Medicare HMO | Admitting: Pulmonary Disease

## 2018-10-17 VITALS — BP 146/78 | HR 69 | Ht 63.0 in | Wt 164.0 lb

## 2018-10-17 DIAGNOSIS — J4541 Moderate persistent asthma with (acute) exacerbation: Secondary | ICD-10-CM | POA: Diagnosis not present

## 2018-10-17 DIAGNOSIS — F411 Generalized anxiety disorder: Secondary | ICD-10-CM

## 2018-10-17 DIAGNOSIS — R0602 Shortness of breath: Secondary | ICD-10-CM

## 2018-10-17 DIAGNOSIS — R059 Cough, unspecified: Secondary | ICD-10-CM

## 2018-10-17 DIAGNOSIS — R05 Cough: Secondary | ICD-10-CM | POA: Diagnosis not present

## 2018-10-17 DIAGNOSIS — Z853 Personal history of malignant neoplasm of breast: Secondary | ICD-10-CM

## 2018-10-17 DIAGNOSIS — F410 Panic disorder [episodic paroxysmal anxiety] without agoraphobia: Secondary | ICD-10-CM | POA: Diagnosis not present

## 2018-10-17 NOTE — Patient Instructions (Signed)
Per our discussion today, I am ordering the following tests:  CBC with differential and IgE levels  Pulmonary function tests
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
## 2018-10-18 LAB — CBC WITH DIFFERENTIAL/PLATELET
Basophils Absolute: 0 10*3/uL (ref 0.0–0.1)
Basophils Relative: 0.9 % (ref 0.0–3.0)
EOS ABS: 0.1 10*3/uL (ref 0.0–0.7)
Eosinophils Relative: 2.5 % (ref 0.0–5.0)
HCT: 39.6 % (ref 36.0–46.0)
Hemoglobin: 13.3 g/dL (ref 12.0–15.0)
Lymphocytes Relative: 43.6 % (ref 12.0–46.0)
Lymphs Abs: 1.7 10*3/uL (ref 0.7–4.0)
MCHC: 33.7 g/dL (ref 30.0–36.0)
MCV: 94.2 fl (ref 78.0–100.0)
Monocytes Absolute: 0.4 10*3/uL (ref 0.1–1.0)
Monocytes Relative: 9.8 % (ref 3.0–12.0)
Neutro Abs: 1.7 10*3/uL (ref 1.4–7.7)
Neutrophils Relative %: 43.2 % (ref 43.0–77.0)
Platelets: 188 10*3/uL (ref 150.0–400.0)
RBC: 4.2 Mil/uL (ref 3.87–5.11)
RDW: 14.1 % (ref 11.5–15.5)
WBC: 4 10*3/uL (ref 4.0–10.5)

## 2018-10-18 LAB — IGE: IGE (IMMUNOGLOBULIN E), SERUM: 8 kU/L (ref <=114)

## 2018-10-22 NOTE — Progress Notes (Signed)
Patient returned call.  Advised of lab results / recs as stated by Dr Loanne Drilling.  Pt verbalized understanding and denied any questions.

## 2018-10-22 NOTE — Progress Notes (Signed)
LMOMTCB x 1 

## 2018-10-23 ENCOUNTER — Ambulatory Visit (INDEPENDENT_AMBULATORY_CARE_PROVIDER_SITE_OTHER): Payer: Medicare HMO | Admitting: Psychiatry

## 2018-10-23 DIAGNOSIS — F4323 Adjustment disorder with mixed anxiety and depressed mood: Secondary | ICD-10-CM

## 2018-10-30 ENCOUNTER — Telehealth: Payer: Self-pay | Admitting: Nurse Practitioner

## 2018-10-30 DIAGNOSIS — F411 Generalized anxiety disorder: Secondary | ICD-10-CM

## 2018-10-30 MED ORDER — ALPRAZOLAM 1 MG PO TABS: 0.5000 mg | ORAL_TABLET | Freq: Three times a day (TID) | ORAL | 0 refills | Status: DC | PRN

## 2018-10-30 NOTE — Telephone Encounter (Signed)
Contacted [name] via telephone to discuss xanax refill. She reported that she picked prescription for 30tabs on 10/16/2018 (direction was 1tab every 8hrs as needed). She is about to run out out hence she is needing a refill of original prescription. After review of PMP database and contacting [employer], I found that is a discrepancy in the information entered in PMP database. 30tabs was dispensed on 10/16/2018 and not 45tabs. I informed [name] of the discrepancy. I also informed her that I will be refill her prescription with original directions which is 0.5mg  every 8hrs as needed, #45tabs. She verbalized understanding.

## 2018-10-30 NOTE — Addendum Note (Signed)
Addended by: Leana Gamer on: 10/30/2018 03:34 PM   Modules accepted: Orders

## 2018-10-30 NOTE — Telephone Encounter (Signed)
[place] please advise, PMP checked last 3 pick up was 08/02/2018 for 45 tab 09/14/2018 for 45 tab 10/16/2018 for 45 tab this is rx we sent in for her back on 08/02/2018.   We sent in new rx for 30 tab to help with stress (see phone note) on 10/08/2018. But the pt is requesting 45 tab like the original refill.

## 2018-10-30 NOTE — Telephone Encounter (Signed)
30additional tabs were sent to help her with recent death if her family. If she does not need this prescription, then she will need to waiting till 11/14/2018 for her next refill.

## 2018-10-30 NOTE — Telephone Encounter (Signed)
Baxter Flattery with [facility] calling and states that the patient is needing an updated prescription. States that the prescription that was sent on 10/08/2018 was for 30 tablets, per patient to pharmacy she is needing 45 tablets, not 30- noted on refill request from pharmacy. Please advise.

## 2018-11-06 ENCOUNTER — Encounter: Payer: Self-pay | Admitting: Nurse Practitioner

## 2018-11-06 ENCOUNTER — Ambulatory Visit (INDEPENDENT_AMBULATORY_CARE_PROVIDER_SITE_OTHER): Payer: Medicare HMO | Admitting: Nurse Practitioner

## 2018-11-06 VITALS — BP 140/80 | HR 75 | Temp 98.0°F | Ht 63.0 in | Wt 173.8 lb

## 2018-11-06 DIAGNOSIS — H6523 Chronic serous otitis media, bilateral: Secondary | ICD-10-CM

## 2018-11-06 MED ORDER — MECLIZINE HCL 12.5 MG PO TABS: 12.5000 mg | ORAL_TABLET | Freq: Two times a day (BID) | ORAL | 0 refills | Status: DC | PRN

## 2018-11-06 NOTE — Progress Notes (Signed)
Subjective:  Patient ID: [name], female    DOB: [birth year]  Age: 68 y.o. MRN: [id number]  CC: Ear Fullness (pt is c/o of ears pain and full/using preroxide and tylenol-- felt dizzy-loss balance,green mucus,congestions/going on 3 days ago--alot going on with family. pt also complaining of face felt "HOT')  Otalgia   There is pain in both ears. The current episode started more than 1 year ago. The problem occurs constantly. The problem has been waxing and waning. There has been no fever. Associated symptoms include coughing. Pertinent negatives include no ear discharge, headaches, hearing loss, neck pain, rhinorrhea or sore throat. She has tried ear drops for the symptoms. The treatment provided no relief. There is no history of a chronic ear infection, hearing loss or a tympanostomy tube.  associated with intermittent dizziness. unable to tolerate anthistamine or flonase or prednisone.  Reviewed past Medical, Social and Family history today.  Outpatient Medications Prior to Visit  Medication Sig Dispense Refill  . acetaminophen (TYLENOL) 500 MG tablet Take 500-1,000 mg by mouth every 6 (six) hours as needed for moderate pain.     Marland Kitchen ALPRAZolam (XANAX) 1 MG tablet Take 0.5 tablets (0.5 mg total) by mouth every 8 (eight) hours as needed for anxiety. 45 tablet 0  . amLODipine (NORVASC) 5 MG tablet Take 1 tablet (5 mg total) by mouth daily. 90 tablet 3  . ascorbic acid (VITAMIN C) 1000 MG tablet Take 1,000 mg by mouth daily.     Marland Kitchen aspirin EC 81 MG tablet Take 81 mg by mouth daily.    Marland Kitchen BIOTIN PO Take 1 tablet by mouth every morning. Hair Skin and Nails    . Calcium Carbonate-Vitamin D (CALCIUM-CARB 600 + D) 600-125 MG-UNIT TABS Take 1 tablet by mouth daily.     . cetirizine (ZYRTEC) 10 MG tablet Take 1 tablet (10 mg total) by mouth at bedtime. 30 tablet 1  . EPINEPHrine (EPIPEN 2-PAK) 0.3 mg/0.3 mL IJ SOAJ injection Inject into the muscle.    Marland Kitchen guaiFENesin (MUCINEX) 600 MG 12 hr tablet  Take 1 tablet (600 mg total) by mouth 2 (two) times daily as needed for cough or to loosen phlegm. 14 tablet 0  . losartan (COZAAR) 100 MG tablet Take 1 tablet (100 mg total) by mouth daily. 90 tablet 3  . Magnesium Oxide 400 (240 Mg) MG TABS Take 1 tablet by mouth daily.     . metoprolol tartrate (LOPRESSOR) 100 MG tablet Take 1 tablet (100 mg total) by mouth 2 (two) times daily. 180 tablet 3  . montelukast (SINGULAIR) 10 MG tablet Take 1 tablet (10 mg total) by mouth at bedtime. 30 tablet 3  . nitroGLYCERIN (NITROSTAT) 0.4 MG SL tablet Place 0.4 mg under the tongue every 5 (five) minutes as needed for chest pain.     Marland Kitchen nystatin (MYCOSTATIN) 100000 UNIT/ML suspension Take 5 mLs (500,000 Units total) by mouth 4 (four) times daily. 60 mL 1  . pantoprazole (PROTONIX) 40 MG tablet Take 1 tablet (40 mg total) by mouth daily. 90 tablet 3  . potassium chloride SA (K-DUR,KLOR-CON) 20 MEQ tablet Take 20 mEq by mouth every other day.    . pravastatin (PRAVACHOL) 40 MG tablet TAKE 1 TABLET BY MOUTH IN THE EVENING 90 tablet 3  . promethazine-dextromethorphan (PROMETHAZINE-DM) 6.25-15 MG/5ML syrup Take 5 mLs by mouth 3 (three) times daily as needed for cough. 120 mL 0  . RESTASIS 0.05 % ophthalmic emulsion Place 1 drop into both eyes 2 (  two) times daily.  3  . valACYclovir (VALTREX) 500 MG tablet Take 500 mg by mouth daily as needed (infection).    . VENTOLIN HFA 108 (90 Base) MCG/ACT inhaler Inhale 2 puffs into the lungs every 6 (six) hours as needed for wheezing or shortness of breath.     . vitamin B-12 (CYANOCOBALAMIN) 1000 MCG tablet Take 1 tablet (1,000 mcg total) by mouth daily. 30 tablet 1  . vitamin E (VITAMIN E) 400 UNIT capsule Take 400 Units by mouth daily.    . benzonatate (TESSALON) 100 MG capsule Take 1 capsule (100 mg total) by mouth 3 (three) times daily as needed for cough. (Patient not taking: Reported on 10/17/2018) 60 capsule 1  . budesonide-formoterol (SYMBICORT) 160-4.5 MCG/ACT inhaler  Inhale 1 puff into the lungs 2 (two) times daily. Rinse mouth after each use (Patient not taking: Reported on 10/17/2018) 1 Inhaler 3   No facility-administered medications prior to visit.     ROS See HPI  Objective:  BP 140/80   Pulse 75   Temp 98 F (36.7 C) (Oral)   Ht 5\' 3"  (1.6 m)   Wt 173 lb 12.8 oz (78.8 kg)   SpO2 99%   BMI 30.79 kg/m   BP Readings from Last 3 Encounters:  11/06/18 140/80  10/17/18 (!) 146/78  09/17/18 138/82    Wt Readings from Last 3 Encounters:  11/06/18 173 lb 12.8 oz (78.8 kg)  10/17/18 164 lb (74.4 kg)  09/17/18 174 lb 6.4 oz (79.1 kg)    Physical Exam HENT:     Right Ear: Ear canal and external ear normal. A middle ear effusion is present. There is no impacted cerumen. No mastoid tenderness. Tympanic membrane is not injected.     Left Ear: Ear canal and external ear normal. A middle ear effusion is present. There is no impacted cerumen. No mastoid tenderness. Tympanic membrane is not injected.     Nose: Nose normal.     Mouth/Throat:     Mouth: Mucous membranes are moist.     Pharynx: No oropharyngeal exudate or posterior oropharyngeal erythema.  Neck:     Musculoskeletal: Normal range of motion and neck supple.  Cardiovascular:     Rate and Rhythm: Normal rate.  Pulmonary:     Effort: Pulmonary effort is normal.  Lymphadenopathy:     Cervical: No cervical adenopathy.  Neurological:     Mental Status: She is alert and oriented to person, place, and time.  Psychiatric:        Mood and Affect: Mood normal.        Behavior: Behavior normal.     Lab Results  Component Value Date   WBC 4.0 10/17/2018   HGB 13.3 10/17/2018   HCT 39.6 10/17/2018   PLT 188.0 10/17/2018   GLUCOSE 100 (H) 08/02/2018   CHOL 141 07/13/2018   TRIG 96 07/13/2018   HDL 38 (L) 07/13/2018   LDLCALC 84 07/13/2018   ALT 9 06/12/2018   AST 13 06/12/2018   NA 142 08/02/2018   K 4.5 08/02/2018   CL 102 08/02/2018   CREATININE 0.94 08/02/2018   BUN 20  08/02/2018   CO2 25 08/02/2018   TSH 1.196 07/12/2018   INR 1.02 08/17/2013   HGBA1C 5.8 (H) 07/13/2018    Dg Chest 2 View  Result Date: 07/12/2018 CLINICAL DATA:  Pt c/o not feeling well for several days. reports chest pressure. Pt reports that went to PCP yesterday and was started on new  HTN medications. PT HX: ex smoker, asthma, ex smoker EXAM: CHEST - 2 VIEW COMPARISON:  none FINDINGS: Lungs are clear. Heart size and mediastinal contours are within normal limits. No effusion. Cervical fixation hardware partially visualized. Cholecystectomy clips. IMPRESSION: No acute cardiopulmonary disease. Electronically Signed   By: Lucrezia Europe M.D.   On: 07/12/2018 18:45   Nm Pulmonary Vent And Perf (v/q Scan)  Result Date: 07/12/2018 CLINICAL DATA:  Initial evaluation for acute chest pressure, suspected PE. EXAM: NUCLEAR MEDICINE VENTILATION - PERFUSION LUNG SCAN TECHNIQUE: Ventilation images were obtained in multiple projections using inhaled aerosol Tc-3m DTPA. Perfusion images were obtained in multiple projections after intravenous injection of Tc-43m-MAA. RADIOPHARMACEUTICALS:  32.3 mCi of Tc-33m DTPA aerosol inhalation and 4.31 mCi Tc42m-MAA IV COMPARISON:  Comparison made with prior radiograph from earlier the same day. FINDINGS: Ventilation: No focal ventilation defect. Perfusion: No wedge shaped peripheral perfusion defects to suggest acute pulmonary embolism. IMPRESSION: Negative VQ scan. No imaging findings to suggest pulmonary embolism identified. Electronically Signed   By: Jeannine Boga M.D.   On: 07/12/2018 22:32   Vas [place] Lower Extremity Venous (dvt)  Result Date: 07/15/2018  Lower Venous Study Indications: Edema, and Multiple medical issues, positive D-dimer.  Performing Technologist: Toma Copier RVS  Examination Guidelines: A complete evaluation includes B-mode imaging, spectral Doppler, color Doppler, and power Doppler as needed of all accessible portions of each vessel.  Bilateral testing is considered an integral part of a complete examination. Limited examinations for reoccurring indications may be performed as noted.  Right Venous Findings: +---------+---------------+---------+-----------+----------+-------+          CompressibilityPhasicitySpontaneityPropertiesSummary +---------+---------------+---------+-----------+----------+-------+ CFV      Full           Yes      Yes                          +---------+---------------+---------+-----------+----------+-------+ SFJ      Full                                                 +---------+---------------+---------+-----------+----------+-------+ FV Prox  Full           Yes      Yes                          +---------+---------------+---------+-----------+----------+-------+ FV Mid   Full                                                 +---------+---------------+---------+-----------+----------+-------+ FV DistalFull           Yes      Yes                          +---------+---------------+---------+-----------+----------+-------+ PFV      Full           Yes      Yes                          +---------+---------------+---------+-----------+----------+-------+ POP      Full           Yes  Yes                          +---------+---------------+---------+-----------+----------+-------+ PTV      Full                                                 +---------+---------------+---------+-----------+----------+-------+ PERO     Full                                                 +---------+---------------+---------+-----------+----------+-------+  Left Venous Findings: +---------+---------------+---------+-----------+----------+-------+          CompressibilityPhasicitySpontaneityPropertiesSummary +---------+---------------+---------+-----------+----------+-------+ CFV      Full           Yes      Yes                           +---------+---------------+---------+-----------+----------+-------+ SFJ      Full                                                 +---------+---------------+---------+-----------+----------+-------+ FV Prox  Full           Yes      Yes                          +---------+---------------+---------+-----------+----------+-------+ FV Mid   Full                                                 +---------+---------------+---------+-----------+----------+-------+ FV DistalFull           Yes      Yes                          +---------+---------------+---------+-----------+----------+-------+ PFV      Full           Yes      Yes                          +---------+---------------+---------+-----------+----------+-------+ POP      Full           Yes      Yes                          +---------+---------------+---------+-----------+----------+-------+ PTV      Full                                                 +---------+---------------+---------+-----------+----------+-------+ PERO     Full                                                 +---------+---------------+---------+-----------+----------+-------+  Summary: Right: There is no evidence of deep vein thrombosis in the lower extremity. No cystic structure found in the popliteal fossa. Left: There is no evidence of deep vein thrombosis in the lower extremity. No cystic structure found in the popliteal fossa.  *See table(s) above for measurements and observations. Electronically signed by Curt Jews MD on 07/15/2018 at 8:52:48 AM.    Final     Assessment & Plan:   [name] was seen today for ear fullness.  Diagnoses and all orders for this visit:  Bilateral chronic serous otitis media -     meclizine (ANTIVERT) 12.5 MG tablet; Take 1 tablet (12.5 mg total) by mouth 2 (two) times daily as needed for dizziness (do not use for more than 3days). -     Ambulatory referral to ENT   I am having [name]  start on meclizine. I am also having her maintain her Calcium Carbonate-Vitamin D, vitamin E, ascorbic acid, aspirin EC, acetaminophen, nitroGLYCERIN, BIOTIN PO, pantoprazole, RESTASIS, vitamin B-12, Magnesium Oxide, EPINEPHrine, benzonatate, losartan, metoprolol tartrate, pravastatin, VENTOLIN HFA, potassium chloride SA, valACYclovir, nystatin, cetirizine, promethazine-dextromethorphan, amLODipine, montelukast, budesonide-formoterol, guaiFENesin, and ALPRAZolam.  Meds ordered this encounter  Medications  . meclizine (ANTIVERT) 12.5 MG tablet    Sig: Take 1 tablet (12.5 mg total) by mouth 2 (two) times daily as needed for dizziness (do not use for more than 3days).    Dispense:  6 tablet    Refill:  0    Order Specific Question:   Supervising Provider    Answer:   MATTHEWS, CODY [[id number]]    Problem List Items Addressed This Visit    None    Visit Diagnoses    Bilateral chronic serous otitis media    -  Primary   Relevant Medications   meclizine (ANTIVERT) 12.5 MG tablet   Other Relevant Orders   Ambulatory referral to ENT       Follow-up: No follow-ups on file.  Wilfred Lacy, NP

## 2018-11-06 NOTE — Patient Instructions (Signed)
Please f/up with ENT to address fluid in ears.   Eustachian Tube Dysfunction  Eustachian tube dysfunction refers to a condition in which a blockage develops in the narrow passage that connects the middle ear to the back of the nose (eustachian tube). The eustachian tube regulates air pressure in the middle ear by letting air move between the ear and nose. It also helps to drain fluid from the middle ear space. Eustachian tube dysfunction can affect one or both ears. When the eustachian tube does not function properly, air pressure, fluid, or both can build up in the middle ear. What are the causes? This condition occurs when the eustachian tube becomes blocked or cannot open normally. Common causes of this condition include:  Ear infections.  Colds and other infections that affect the nose, mouth, and throat (upper respiratory tract).  Allergies.  Irritation from cigarette smoke.  Irritation from stomach acid coming up into the esophagus (gastroesophageal reflux). The esophagus is the tube that carries food from the mouth to the stomach.  Sudden changes in air pressure, such as from descending in an airplane or scuba diving.  Abnormal growths in the nose or throat, such as: ? Growths that line the nose (nasal polyps). ? Abnormal growth of cells (tumors). ? Enlarged tissue at the back of the throat (adenoids). What increases the risk? You are more likely to develop this condition if:  You smoke.  You are overweight.  You are a child who has: ? Certain birth defects of the mouth, such as cleft palate. ? Large tonsils or adenoids. What are the signs or symptoms? Common symptoms of this condition include:  A feeling of fullness in the ear.  Ear pain.  Clicking or popping noises in the ear.  Ringing in the ear.  Hearing loss.  Loss of balance.  Dizziness. Symptoms may get worse when the air pressure around you changes, such as when you travel to an area of high  elevation, fly on an airplane, or go scuba diving. How is this diagnosed? This condition may be diagnosed based on:  Your symptoms.  A physical exam of your ears, nose, and throat.  Tests, such as those that measure: ? The movement of your eardrum (tympanogram). ? Your hearing (audiometry). How is this treated? Treatment depends on the cause and severity of your condition.  In mild cases, you may relieve your symptoms by moving air into your ears. This is called "popping the ears."  In more severe cases, or if you have symptoms of fluid in your ears, treatment may include: ? Medicines to relieve congestion (decongestants). ? Medicines that treat allergies (antihistamines). ? Nasal sprays or ear drops that contain medicines that reduce swelling (steroids). ? A procedure to drain the fluid in your eardrum (myringotomy). In this procedure, a small tube is placed in the eardrum to:  Drain the fluid.  Restore the air in the middle ear space. ? A procedure to insert a balloon device through the nose to inflate the opening of the eustachian tube (balloon dilation). Follow these instructions at home: Lifestyle  Do not do any of the following until your health care provider approves: ? Travel to high altitudes. ? Fly in airplanes. ? Work in a Pension scheme manager or room. ? Scuba dive.  Do not use any products that contain nicotine or tobacco, such as cigarettes and e-cigarettes. If you need help quitting, ask your health care provider.  Keep your ears dry. Wear fitted earplugs during showering and  bathing. Dry your ears completely after. General instructions  Take over-the-counter and prescription medicines only as told by your health care provider.  Use techniques to help pop your ears as recommended by your health care provider. These may include: ? Chewing gum. ? Yawning. ? Frequent, forceful swallowing. ? Closing your mouth, holding your nose closed, and gently blowing as if  you are trying to blow air out of your nose.  Keep all follow-up visits as told by your health care provider. This is important. Contact a health care provider if:  Your symptoms do not go away after treatment.  Your symptoms come back after treatment.  You are unable to pop your ears.  You have: ? A fever. ? Pain in your ear. ? Pain in your head or neck. ? Fluid draining from your ear.  Your hearing suddenly changes.  You become very dizzy.  You lose your balance. Summary  Eustachian tube dysfunction refers to a condition in which a blockage develops in the eustachian tube.  It can be caused by ear infections, allergies, inhaled irritants, or abnormal growths in the nose or throat.  Symptoms include ear pain, hearing loss, or ringing in the ears.  Mild cases are treated with maneuvers to unblock the ears, such as yawning or ear popping.  Severe cases are treated with medicines. Surgery may also be done (rare). This information is not intended to replace advice given to you by your health care provider. Make sure you discuss any questions you have with your health care provider. Document Released: 09/25/2015 Document Revised: 12/19/2017 Document Reviewed: 12/19/2017 Elsevier Interactive Patient Education  2019 [employer].

## 2018-11-07 ENCOUNTER — Ambulatory Visit (INDEPENDENT_AMBULATORY_CARE_PROVIDER_SITE_OTHER): Payer: Medicare HMO | Admitting: Psychiatry

## 2018-11-07 DIAGNOSIS — F4323 Adjustment disorder with mixed anxiety and depressed mood: Secondary | ICD-10-CM | POA: Diagnosis not present

## 2018-11-08 ENCOUNTER — Ambulatory Visit: Payer: Self-pay | Admitting: Pulmonary Disease

## 2018-11-08 ENCOUNTER — Encounter: Payer: Self-pay | Admitting: Nurse Practitioner

## 2018-11-12 ENCOUNTER — Ambulatory Visit (INDEPENDENT_AMBULATORY_CARE_PROVIDER_SITE_OTHER): Payer: Medicare HMO | Admitting: Nurse Practitioner

## 2018-11-12 ENCOUNTER — Encounter: Payer: Self-pay | Admitting: Nurse Practitioner

## 2018-11-12 VITALS — BP 148/82 | HR 68 | Temp 98.8°F | Resp 22 | Ht 63.0 in | Wt 173.4 lb

## 2018-11-12 DIAGNOSIS — J020 Streptococcal pharyngitis: Secondary | ICD-10-CM | POA: Diagnosis not present

## 2018-11-12 DIAGNOSIS — B370 Candidal stomatitis: Secondary | ICD-10-CM | POA: Diagnosis not present

## 2018-11-12 LAB — POCT RAPID STREP A (OFFICE): RAPID STREP A SCREEN: POSITIVE — AB

## 2018-11-12 MED ORDER — BENZOCAINE-MENTHOL 6-10 MG MT LOZG: 1.0000 | LOZENGE | OROMUCOSAL | 0 refills | Status: DC | PRN

## 2018-11-12 MED ORDER — NYSTATIN 100000 UNIT/ML MT SUSP: 5.0000 mL | Freq: Four times a day (QID) | OROMUCOSAL | 0 refills | Status: DC

## 2018-11-12 MED ORDER — DOXYCYCLINE HYCLATE 100 MG PO TABS: 100.0000 mg | ORAL_TABLET | Freq: Two times a day (BID) | ORAL | 0 refills | Status: AC

## 2018-11-12 NOTE — Progress Notes (Signed)
Subjective:  Patient ID: [name], female    DOB: 1951/01/20  Age: 68 y.o. MRN: [id number]  CC: URI (sore throat /ears/body aches/sick family members/SOB)  Sore Throat   This is a new problem. The current episode started yesterday. The problem has been unchanged. There has been no fever. Associated symptoms include ear pain. Pertinent negatives include no abdominal pain, congestion, coughing, ear discharge, plugged ear sensation, neck pain, shortness of breath, stridor, swollen glands, trouble swallowing or vomiting. She has had exposure to strep. She has had no exposure to mono. She has tried acetaminophen for the symptoms. The treatment provided mild relief.   Reviewed past Medical, Social and Family history today.  Outpatient Medications Prior to Visit  Medication Sig Dispense Refill  . acetaminophen (TYLENOL) 500 MG tablet Take 500-1,000 mg by mouth every 6 (six) hours as needed for moderate pain.     Marland Kitchen ALPRAZolam (XANAX) 1 MG tablet Take 0.5 tablets (0.5 mg total) by mouth every 8 (eight) hours as needed for anxiety. 45 tablet 0  . amLODipine (NORVASC) 5 MG tablet Take 1 tablet (5 mg total) by mouth daily. 90 tablet 3  . ascorbic acid (VITAMIN C) 1000 MG tablet Take 1,000 mg by mouth daily.     Marland Kitchen aspirin EC 81 MG tablet Take 81 mg by mouth daily.    . benzonatate (TESSALON) 100 MG capsule Take 1 capsule (100 mg total) by mouth 3 (three) times daily as needed for cough. 60 capsule 1  . BIOTIN PO Take 1 tablet by mouth every morning. Hair Skin and Nails    . budesonide-formoterol (SYMBICORT) 160-4.5 MCG/ACT inhaler Inhale 1 puff into the lungs 2 (two) times daily. Rinse mouth after each use 1 Inhaler 3  . Calcium Carbonate-Vitamin D (CALCIUM-CARB 600 + D) 600-125 MG-UNIT TABS Take 1 tablet by mouth daily.     . cetirizine (ZYRTEC) 10 MG tablet Take 1 tablet (10 mg total) by mouth at bedtime. 30 tablet 1  . EPINEPHrine (EPIPEN 2-PAK) 0.3 mg/0.3 mL IJ SOAJ injection Inject into the  muscle.    Marland Kitchen guaiFENesin (MUCINEX) 600 MG 12 hr tablet Take 1 tablet (600 mg total) by mouth 2 (two) times daily as needed for cough or to loosen phlegm. 14 tablet 0  . losartan (COZAAR) 100 MG tablet Take 1 tablet (100 mg total) by mouth daily. 90 tablet 3  . Magnesium Oxide 400 (240 Mg) MG TABS Take 1 tablet by mouth daily.     . meclizine (ANTIVERT) 12.5 MG tablet Take 1 tablet (12.5 mg total) by mouth 2 (two) times daily as needed for dizziness (do not use for more than 3days). 6 tablet 0  . metoprolol tartrate (LOPRESSOR) 100 MG tablet Take 1 tablet (100 mg total) by mouth 2 (two) times daily. 180 tablet 3  . montelukast (SINGULAIR) 10 MG tablet Take 1 tablet (10 mg total) by mouth at bedtime. 30 tablet 3  . nitroGLYCERIN (NITROSTAT) 0.4 MG SL tablet Place 0.4 mg under the tongue every 5 (five) minutes as needed for chest pain.     . pantoprazole (PROTONIX) 40 MG tablet Take 1 tablet (40 mg total) by mouth daily. 90 tablet 3  . potassium chloride SA (K-DUR,KLOR-CON) 20 MEQ tablet Take 20 mEq by mouth every other day.    . pravastatin (PRAVACHOL) 40 MG tablet TAKE 1 TABLET BY MOUTH IN THE EVENING 90 tablet 3  . promethazine-dextromethorphan (PROMETHAZINE-DM) 6.25-15 MG/5ML syrup Take 5 mLs by mouth 3 (three)  times daily as needed for cough. 120 mL 0  . RESTASIS 0.05 % ophthalmic emulsion Place 1 drop into both eyes 2 (two) times daily.  3  . valACYclovir (VALTREX) 500 MG tablet Take 500 mg by mouth daily as needed (infection).    . VENTOLIN HFA 108 (90 Base) MCG/ACT inhaler Inhale 2 puffs into the lungs every 6 (six) hours as needed for wheezing or shortness of breath.     . vitamin B-12 (CYANOCOBALAMIN) 1000 MCG tablet Take 1 tablet (1,000 mcg total) by mouth daily. 30 tablet 1  . vitamin E (VITAMIN E) 400 UNIT capsule Take 400 Units by mouth daily.    Marland Kitchen nystatin (MYCOSTATIN) 100000 UNIT/ML suspension Take 5 mLs (500,000 Units total) by mouth 4 (four) times daily. 60 mL 1   No  facility-administered medications prior to visit.     ROS See HPI  Objective:  BP (!) 148/82 (BP Location: Right Arm)   Pulse 68   Temp 98.8 F (37.1 C) (Oral)   Resp (!) 22   Ht 5\' 3"  (1.6 m)   Wt 173 lb 6.4 oz (78.7 kg)   SpO2 99%   BMI 30.72 kg/m   BP Readings from Last 3 Encounters:  11/12/18 (!) 148/82  11/06/18 140/80  10/17/18 (!) 146/78    Wt Readings from Last 3 Encounters:  11/12/18 173 lb 6.4 oz (78.7 kg)  11/06/18 173 lb 12.8 oz (78.8 kg)  10/17/18 164 lb (74.4 kg)    Physical Exam Vitals signs reviewed.  HENT:     Right Ear: External ear normal. A middle ear effusion is present. There is no impacted cerumen. No mastoid tenderness. Tympanic membrane is not injected or erythematous.     Left Ear: External ear normal. A middle ear effusion is present. There is no impacted cerumen. No mastoid tenderness. Tympanic membrane is not injected or erythematous.     Nose: Nose normal.     Mouth/Throat:     Pharynx: Oropharyngeal exudate and posterior oropharyngeal erythema present.  Cardiovascular:     Rate and Rhythm: Normal rate.     Pulses: Normal pulses.  Pulmonary:     Effort: Pulmonary effort is normal.  Neurological:     Mental Status: She is alert and oriented to person, place, and time.     Lab Results  Component Value Date   WBC 4.0 10/17/2018   HGB 13.3 10/17/2018   HCT 39.6 10/17/2018   PLT 188.0 10/17/2018   GLUCOSE 100 (H) 08/02/2018   CHOL 141 07/13/2018   TRIG 96 07/13/2018   HDL 38 (L) 07/13/2018   LDLCALC 84 07/13/2018   ALT 9 06/12/2018   AST 13 06/12/2018   NA 142 08/02/2018   K 4.5 08/02/2018   CL 102 08/02/2018   CREATININE 0.94 08/02/2018   BUN 20 08/02/2018   CO2 25 08/02/2018   TSH 1.196 07/12/2018   INR 1.02 08/17/2013   HGBA1C 5.8 (H) 07/13/2018    Dg Chest 2 View  Result Date: 07/12/2018 CLINICAL DATA:  Pt c/o not feeling well for several days. reports chest pressure. Pt reports that went to PCP yesterday and was  started on new HTN medications. PT HX: ex smoker, asthma, ex smoker EXAM: CHEST - 2 VIEW COMPARISON:  none FINDINGS: Lungs are clear. Heart size and mediastinal contours are within normal limits. No effusion. Cervical fixation hardware partially visualized. Cholecystectomy clips. IMPRESSION: No acute cardiopulmonary disease. Electronically Signed   By: Eden Emms.D.  On: 07/12/2018 18:45   Nm Pulmonary Vent And Perf (v/q Scan)  Result Date: 07/12/2018 CLINICAL DATA:  Initial evaluation for acute chest pressure, suspected PE. EXAM: NUCLEAR MEDICINE VENTILATION - PERFUSION LUNG SCAN TECHNIQUE: Ventilation images were obtained in multiple projections using inhaled aerosol Tc-32m DTPA. Perfusion images were obtained in multiple projections after intravenous injection of Tc-43m-MAA. RADIOPHARMACEUTICALS:  32.3 mCi of Tc-51m DTPA aerosol inhalation and 4.31 mCi Tc5m-MAA IV COMPARISON:  Comparison made with prior radiograph from earlier the same day. FINDINGS: Ventilation: No focal ventilation defect. Perfusion: No wedge shaped peripheral perfusion defects to suggest acute pulmonary embolism. IMPRESSION: Negative VQ scan. No imaging findings to suggest pulmonary embolism identified. Electronically Signed   By: Jeannine Boga M.D.   On: 07/12/2018 22:32   Vas [place] Lower Extremity Venous (dvt)  Result Date: 07/15/2018  Lower Venous Study Indications: Edema, and Multiple medical issues, positive D-dimer.  Performing Technologist: Toma Copier RVS  Examination Guidelines: A complete evaluation includes B-mode imaging, spectral Doppler, color Doppler, and power Doppler as needed of all accessible portions of each vessel. Bilateral testing is considered an integral part of a complete examination. Limited examinations for reoccurring indications may be performed as noted.  Right Venous Findings: +---------+---------------+---------+-----------+----------+-------+           CompressibilityPhasicitySpontaneityPropertiesSummary +---------+---------------+---------+-----------+----------+-------+ CFV      Full           Yes      Yes                          +---------+---------------+---------+-----------+----------+-------+ SFJ      Full                                                 +---------+---------------+---------+-----------+----------+-------+ FV Prox  Full           Yes      Yes                          +---------+---------------+---------+-----------+----------+-------+ FV Mid   Full                                                 +---------+---------------+---------+-----------+----------+-------+ FV DistalFull           Yes      Yes                          +---------+---------------+---------+-----------+----------+-------+ PFV      Full           Yes      Yes                          +---------+---------------+---------+-----------+----------+-------+ POP      Full           Yes      Yes                          +---------+---------------+---------+-----------+----------+-------+ PTV      Full                                                 +---------+---------------+---------+-----------+----------+-------+  PERO     Full                                                 +---------+---------------+---------+-----------+----------+-------+  Left Venous Findings: +---------+---------------+---------+-----------+----------+-------+          CompressibilityPhasicitySpontaneityPropertiesSummary +---------+---------------+---------+-----------+----------+-------+ CFV      Full           Yes      Yes                          +---------+---------------+---------+-----------+----------+-------+ SFJ      Full                                                 +---------+---------------+---------+-----------+----------+-------+ FV Prox  Full           Yes      Yes                           +---------+---------------+---------+-----------+----------+-------+ FV Mid   Full                                                 +---------+---------------+---------+-----------+----------+-------+ FV DistalFull           Yes      Yes                          +---------+---------------+---------+-----------+----------+-------+ PFV      Full           Yes      Yes                          +---------+---------------+---------+-----------+----------+-------+ POP      Full           Yes      Yes                          +---------+---------------+---------+-----------+----------+-------+ PTV      Full                                                 +---------+---------------+---------+-----------+----------+-------+ PERO     Full                                                 +---------+---------------+---------+-----------+----------+-------+    Summary: Right: There is no evidence of deep vein thrombosis in the lower extremity. No cystic structure found in the popliteal fossa. Left: There is no evidence of deep vein thrombosis in the lower extremity. No cystic structure found in the popliteal fossa.  *See table(s) above for measurements and observations. Electronically signed by Curt Jews MD on 07/15/2018 at 8:52:48 AM.  Final     Assessment & Plan:   Margie was seen today for uri.  Diagnoses and all orders for this visit:  Strep pharyngitis -     POCT rapid strep A -     benzocaine-menthol (CHLORASEPTIC SORE THROAT) 6-10 MG lozenge; Take 1 lozenge by mouth as needed for sore throat. -     nystatin (MYCOSTATIN) 100000 UNIT/ML suspension; Take 5 mLs (500,000 Units total) by mouth 4 (four) times daily. -     doxycycline (VIBRA-TABS) 100 MG tablet; Take 1 tablet (100 mg total) by mouth 2 (two) times daily for 7 days.  Oral thrush -     nystatin (MYCOSTATIN) 100000 UNIT/ML suspension; Take 5 mLs (500,000 Units total) by mouth 4 (four) times daily.   I am  having Mitchel Honour start on benzocaine-menthol and doxycycline. I am also having her maintain her Calcium Carbonate-Vitamin D, vitamin E, ascorbic acid, aspirin EC, acetaminophen, nitroGLYCERIN, BIOTIN PO, pantoprazole, RESTASIS, vitamin B-12, Magnesium Oxide, EPINEPHrine, benzonatate, losartan, metoprolol tartrate, pravastatin, VENTOLIN HFA, potassium chloride SA, valACYclovir, cetirizine, promethazine-dextromethorphan, amLODipine, montelukast, budesonide-formoterol, guaiFENesin, ALPRAZolam, meclizine, and nystatin.  Meds ordered this encounter  Medications  . benzocaine-menthol (CHLORASEPTIC SORE THROAT) 6-10 MG lozenge    Sig: Take 1 lozenge by mouth as needed for sore throat.    Dispense:  100 tablet    Refill:  0    Order Specific Question:   Supervising Provider    Answer:   Lucille Passy [[id number]]  . nystatin (MYCOSTATIN) 100000 UNIT/ML suspension    Sig: Take 5 mLs (500,000 Units total) by mouth 4 (four) times daily.    Dispense:  60 mL    Refill:  0    Order Specific Question:   Supervising Provider    Answer:   Lucille Passy [[id number]]  . doxycycline (VIBRA-TABS) 100 MG tablet    Sig: Take 1 tablet (100 mg total) by mouth 2 (two) times daily for 7 days.    Dispense:  14 tablet    Refill:  0    Order Specific Question:   Supervising Provider    Answer:   Lucille Passy [[id number]]    Problem List Items Addressed This Visit    None    Visit Diagnoses    Strep pharyngitis    -  Primary   Relevant Medications   benzocaine-menthol (CHLORASEPTIC SORE THROAT) 6-10 MG lozenge   nystatin (MYCOSTATIN) 100000 UNIT/ML suspension   doxycycline (VIBRA-TABS) 100 MG tablet   Other Relevant Orders   POCT rapid strep A (Completed)   Oral thrush       Relevant Medications   nystatin (MYCOSTATIN) 100000 UNIT/ML suspension       Follow-up: Return if symptoms worsen or fail to improve.  Wilfred Lacy, NP

## 2018-11-12 NOTE — Patient Instructions (Addendum)
Strep Throat    Strep throat is a bacterial infection of the throat. Your health care provider may call the infection tonsillitis or pharyngitis, depending on whether there is swelling in the tonsils or at the back of the throat. Strep throat is most common during the cold months of the year in children who are 5-66 years of age, but it can happen during any season in people of any age. This infection is spread from person to person (contagious) through coughing, sneezing, or close contact.  What are the causes?  Strep throat is caused by the bacteria called Streptococcus pyogenes.  What increases the risk?  This condition is more likely to develop in:  · People who spend time in crowded places where the infection can spread easily.  · People who have close contact with someone who has strep throat.  What are the signs or symptoms?  Symptoms of this condition include:  · Fever or chills.  · Redness, swelling, or pain in the tonsils or throat.  · Pain or difficulty when swallowing.  · White or yellow spots on the tonsils or throat.  · Swollen, tender glands in the neck or under the jaw.  · Red rash all over the body (rare).  How is this diagnosed?  This condition is diagnosed by performing a rapid strep test or by taking a swab of your throat (throat culture test). Results from a rapid strep test are usually ready in a few minutes, but throat culture test results are available after one or two days.  How is this treated?  This condition is treated with antibiotic medicine.  Follow these instructions at home:  Medicines  · Take over-the-counter and prescription medicines only as told by your health care provider.  · Take your antibiotic as told by your health care provider. Do not stop taking the antibiotic even if you start to feel better.  · Have family members who also have a sore throat or fever tested for strep throat. They may need antibiotics if they have the strep infection.  Eating and drinking  · Do not  share food, drinking cups, or personal items that could cause the infection to spread to other people.  · If swallowing is difficult, try eating soft foods until your sore throat feels better.  · Drink enough fluid to keep your urine clear or pale yellow.  General instructions  · Gargle with a salt-water mixture 3-4 times per day or as needed. To make a salt-water mixture, completely dissolve ½-1 tsp of salt in 1 cup of warm water.  · Make sure that all household members wash their hands well.  · Get plenty of rest.  · Stay home from school or work until you have been taking antibiotics for 24 hours.  · Keep all follow-up visits as told by your health care provider. This is important.  Contact a health care provider if:  · The glands in your neck continue to get bigger.  · You develop a rash, cough, or earache.  · You cough up a thick liquid that is green, yellow-brown, or bloody.  · You have pain or discomfort that does not get better with medicine.  · Your problems seem to be getting worse rather than better.  · You have a fever.  Get help right away if:  · You have new symptoms, such as vomiting, severe headache, stiff or painful neck, chest pain, or shortness of breath.  · You have severe throat   pain, drooling, or changes in your voice.  · You have swelling of the neck, or the skin on the neck becomes red and tender.  · You have signs of dehydration, such as fatigue, dry mouth, and decreased urination.  · You become increasingly sleepy, or you cannot wake up completely.  · Your joints become red or painful.  This information is not intended to replace advice given to you by your health care provider. Make sure you discuss any questions you have with your health care provider.  Document Released: 08/26/2000 Document Revised: 04/27/2016 Document Reviewed: 12/22/2014  [employer]vier Interactive Patient Education © 2019 [employer]

## 2018-11-13 ENCOUNTER — Telehealth: Payer: Self-pay

## 2018-11-13 NOTE — Telephone Encounter (Signed)
Pt has not used her cpap since October. I called pt to verify this. No answer, left a message asking her to call me back.

## 2018-11-14 ENCOUNTER — Ambulatory Visit: Payer: Medicare HMO | Admitting: Neurology

## 2018-11-16 ENCOUNTER — Encounter: Payer: Self-pay | Admitting: Nurse Practitioner

## 2018-11-16 ENCOUNTER — Ambulatory Visit (INDEPENDENT_AMBULATORY_CARE_PROVIDER_SITE_OTHER): Payer: Medicare HMO | Admitting: Nurse Practitioner

## 2018-11-16 VITALS — BP 138/82 | HR 73 | Temp 98.5°F | Ht 63.0 in | Wt 173.4 lb

## 2018-11-16 DIAGNOSIS — Z780 Asymptomatic menopausal state: Secondary | ICD-10-CM

## 2018-11-16 DIAGNOSIS — E782 Mixed hyperlipidemia: Secondary | ICD-10-CM | POA: Diagnosis not present

## 2018-11-16 DIAGNOSIS — F411 Generalized anxiety disorder: Secondary | ICD-10-CM

## 2018-11-16 DIAGNOSIS — J322 Chronic ethmoidal sinusitis: Secondary | ICD-10-CM

## 2018-11-16 DIAGNOSIS — I10 Essential (primary) hypertension: Secondary | ICD-10-CM | POA: Diagnosis not present

## 2018-11-16 MED ORDER — METOPROLOL TARTRATE 100 MG PO TABS: 100.0000 mg | ORAL_TABLET | Freq: Two times a day (BID) | ORAL | 3 refills | Status: AC

## 2018-11-16 MED ORDER — MONTELUKAST SODIUM 10 MG PO TABS: 10.0000 mg | ORAL_TABLET | Freq: Every day | ORAL | 3 refills | Status: DC

## 2018-11-16 MED ORDER — ALPRAZOLAM 1 MG PO TABS: 0.5000 mg | ORAL_TABLET | Freq: Three times a day (TID) | ORAL | 1 refills | Status: DC | PRN

## 2018-11-16 MED ORDER — PRAVASTATIN SODIUM 40 MG PO TABS: ORAL_TABLET | ORAL | 3 refills | Status: AC

## 2018-11-16 MED ORDER — LOSARTAN POTASSIUM 100 MG PO TABS: 100.0000 mg | ORAL_TABLET | Freq: Every day | ORAL | 3 refills | Status: DC

## 2018-11-16 NOTE — Progress Notes (Signed)
Subjective:  Patient ID: [name], female    DOB: 1950/12/06  Age: 68 y.o. MRN: [id number]  CC: Follow-up (HTN/Hyperlip, [facility] called ,no sure of what was said for ENT Ref. ) and Leg Swelling (last few days, Request Dexa Scan)  HPI  Anxiety: Worse since recent death of mother Has multiple drug allergies, hence hesitant about using any other medication other than alprazolam. Declined referral to psychology.  HTN: Controlled with amlodipine, losartan, and metoprolol BP Readings from Last 3 Encounters:  11/16/18 138/82  11/12/18 (!) 148/82  11/06/18 140/80  LE edema noted for last 2days, no SOB, no leg pain, no redness. Traveled to [place] to bury mother, also took part in arrangements whic required prolonged standing.  Hyperlipidemia: LDL at goal CAD without MI Current use of pravastatin  Serous Otitis Media: She is still to make an appt with ENT  Reviewed past Medical, Social and Family history today.  Outpatient Medications Prior to Visit  Medication Sig Dispense Refill  . acetaminophen (TYLENOL) 500 MG tablet Take 500-1,000 mg by mouth every 6 (six) hours as needed for moderate pain.     Marland Kitchen amLODipine (NORVASC) 5 MG tablet Take 1 tablet (5 mg total) by mouth daily. 90 tablet 3  . aspirin EC 81 MG tablet Take 81 mg by mouth daily.    . benzocaine-menthol (CHLORASEPTIC SORE THROAT) 6-10 MG lozenge Take 1 lozenge by mouth as needed for sore throat. 100 tablet 0  . benzonatate (TESSALON) 100 MG capsule Take 1 capsule (100 mg total) by mouth 3 (three) times daily as needed for cough. 60 capsule 1  . budesonide-formoterol (SYMBICORT) 160-4.5 MCG/ACT inhaler Inhale 1 puff into the lungs 2 (two) times daily. Rinse mouth after each use 1 Inhaler 3  . Calcium Carbonate-Vitamin D (CALCIUM-CARB 600 + D) 600-125 MG-UNIT TABS Take 1 tablet by mouth daily.     . cetirizine (ZYRTEC) 10 MG tablet Take 1 tablet (10 mg total) by mouth at bedtime. 30 tablet 1  . doxycycline (VIBRA-TABS)  100 MG tablet Take 1 tablet (100 mg total) by mouth 2 (two) times daily for 7 days. 14 tablet 0  . EPINEPHrine (EPIPEN 2-PAK) 0.3 mg/0.3 mL IJ SOAJ injection Inject into the muscle.    Marland Kitchen guaiFENesin (MUCINEX) 600 MG 12 hr tablet Take 1 tablet (600 mg total) by mouth 2 (two) times daily as needed for cough or to loosen phlegm. 14 tablet 0  . meclizine (ANTIVERT) 12.5 MG tablet Take 1 tablet (12.5 mg total) by mouth 2 (two) times daily as needed for dizziness (do not use for more than 3days). 6 tablet 0  . nystatin (MYCOSTATIN) 100000 UNIT/ML suspension Take 5 mLs (500,000 Units total) by mouth 4 (four) times daily. 60 mL 0  . pantoprazole (PROTONIX) 40 MG tablet Take 1 tablet (40 mg total) by mouth daily. 90 tablet 3  . RESTASIS 0.05 % ophthalmic emulsion Place 1 drop into both eyes 2 (two) times daily.  3  . VENTOLIN HFA 108 (90 Base) MCG/ACT inhaler Inhale 2 puffs into the lungs every 6 (six) hours as needed for wheezing or shortness of breath.     . vitamin B-12 (CYANOCOBALAMIN) 1000 MCG tablet Take 1 tablet (1,000 mcg total) by mouth daily. 30 tablet 1  . ALPRAZolam (XANAX) 1 MG tablet Take 0.5 tablets (0.5 mg total) by mouth every 8 (eight) hours as needed for anxiety. 45 tablet 0  . ascorbic acid (VITAMIN C) 1000 MG tablet Take 1,000 mg by  mouth daily.     Marland Kitchen BIOTIN PO Take 1 tablet by mouth every morning. Hair Skin and Nails    . losartan (COZAAR) 100 MG tablet Take 1 tablet (100 mg total) by mouth daily. 90 tablet 3  . Magnesium Oxide 400 (240 Mg) MG TABS Take 1 tablet by mouth daily.     . metoprolol tartrate (LOPRESSOR) 100 MG tablet Take 1 tablet (100 mg total) by mouth 2 (two) times daily. 180 tablet 3  . montelukast (SINGULAIR) 10 MG tablet Take 1 tablet (10 mg total) by mouth at bedtime. 30 tablet 3  . nitroGLYCERIN (NITROSTAT) 0.4 MG SL tablet Place 0.4 mg under the tongue every 5 (five) minutes as needed for chest pain.     . potassium chloride SA (K-DUR,KLOR-CON) 20 MEQ tablet Take 20  mEq by mouth every other day.    . pravastatin (PRAVACHOL) 40 MG tablet TAKE 1 TABLET BY MOUTH IN THE EVENING 90 tablet 3  . promethazine-dextromethorphan (PROMETHAZINE-DM) 6.25-15 MG/5ML syrup Take 5 mLs by mouth 3 (three) times daily as needed for cough. 120 mL 0  . valACYclovir (VALTREX) 500 MG tablet Take 500 mg by mouth daily as needed (infection).    . vitamin E (VITAMIN E) 400 UNIT capsule Take 400 Units by mouth daily.     No facility-administered medications prior to visit.     ROS See HPI  Objective:  BP 138/82   Pulse 73   Temp 98.5 F (36.9 C) (Oral)   SpO2 98%   BP Readings from Last 3 Encounters:  11/16/18 138/82  11/12/18 (!) 148/82  11/06/18 140/80    Wt Readings from Last 3 Encounters:  11/12/18 173 lb 6.4 oz (78.7 kg)  11/06/18 173 lb 12.8 oz (78.8 kg)  10/17/18 164 lb (74.4 kg)    Physical Exam Vitals signs reviewed.  Cardiovascular:     Rate and Rhythm: Normal rate and regular rhythm.     Pulses: Normal pulses.     Heart sounds: Normal heart sounds.  Pulmonary:     Effort: Pulmonary effort is normal.     Breath sounds: Normal breath sounds.  Musculoskeletal:        General: Tenderness present.     Right lower leg: Edema present.     Left lower leg: Edema present.     Comments: Trace bilateral LE edema  Skin:    Findings: No erythema or rash.  Neurological:     Mental Status: She is alert and oriented to person, place, and time.  Psychiatric:        Mood and Affect: Mood normal.        Behavior: Behavior normal.        Thought Content: Thought content normal.     Lab Results  Component Value Date   WBC 4.0 10/17/2018   HGB 13.3 10/17/2018   HCT 39.6 10/17/2018   PLT 188.0 10/17/2018   GLUCOSE 100 (H) 08/02/2018   CHOL 141 07/13/2018   TRIG 96 07/13/2018   HDL 38 (L) 07/13/2018   LDLCALC 84 07/13/2018   ALT 9 06/12/2018   AST 13 06/12/2018   NA 142 08/02/2018   K 4.5 08/02/2018   CL 102 08/02/2018   CREATININE 0.94 08/02/2018     BUN 20 08/02/2018   CO2 25 08/02/2018   TSH 1.196 07/12/2018   INR 1.02 08/17/2013   HGBA1C 5.8 (H) 07/13/2018    Dg Chest 2 View  Result Date: 07/12/2018 CLINICAL DATA:  Pt  c/o not feeling well for several days. reports chest pressure. Pt reports that went to PCP yesterday and was started on new HTN medications. PT HX: ex smoker, asthma, ex smoker EXAM: CHEST - 2 VIEW COMPARISON:  none FINDINGS: Lungs are clear. Heart size and mediastinal contours are within normal limits. No effusion. Cervical fixation hardware partially visualized. Cholecystectomy clips. IMPRESSION: No acute cardiopulmonary disease. Electronically Signed   By: Lucrezia Europe M.D.   On: 07/12/2018 18:45   Nm Pulmonary Vent And Perf (v/q Scan)  Result Date: 07/12/2018 CLINICAL DATA:  Initial evaluation for acute chest pressure, suspected PE. EXAM: NUCLEAR MEDICINE VENTILATION - PERFUSION LUNG SCAN TECHNIQUE: Ventilation images were obtained in multiple projections using inhaled aerosol Tc-42m DTPA. Perfusion images were obtained in multiple projections after intravenous injection of Tc-37m-MAA. RADIOPHARMACEUTICALS:  32.3 mCi of Tc-75m DTPA aerosol inhalation and 4.31 mCi Tc2m-MAA IV COMPARISON:  Comparison made with prior radiograph from earlier the same day. FINDINGS: Ventilation: No focal ventilation defect. Perfusion: No wedge shaped peripheral perfusion defects to suggest acute pulmonary embolism. IMPRESSION: Negative VQ scan. No imaging findings to suggest pulmonary embolism identified. Electronically Signed   By: Jeannine Boga M.D.   On: 07/12/2018 22:32   Vas [place] Lower Extremity Venous (dvt)  Result Date: 07/15/2018  Lower Venous Study Indications: Edema, and Multiple medical issues, positive D-dimer.  Performing Technologist: Toma Copier RVS  Examination Guidelines: A complete evaluation includes B-mode imaging, spectral Doppler, color Doppler, and power Doppler as needed of all accessible portions of each  vessel. Bilateral testing is considered an integral part of a complete examination. Limited examinations for reoccurring indications may be performed as noted.  Right Venous Findings: +---------+---------------+---------+-----------+----------+-------+          CompressibilityPhasicitySpontaneityPropertiesSummary +---------+---------------+---------+-----------+----------+-------+ CFV      Full           Yes      Yes                          +---------+---------------+---------+-----------+----------+-------+ SFJ      Full                                                 +---------+---------------+---------+-----------+----------+-------+ FV Prox  Full           Yes      Yes                          +---------+---------------+---------+-----------+----------+-------+ FV Mid   Full                                                 +---------+---------------+---------+-----------+----------+-------+ FV DistalFull           Yes      Yes                          +---------+---------------+---------+-----------+----------+-------+ PFV      Full           Yes      Yes                          +---------+---------------+---------+-----------+----------+-------+  POP      Full           Yes      Yes                          +---------+---------------+---------+-----------+----------+-------+ PTV      Full                                                 +---------+---------------+---------+-----------+----------+-------+ PERO     Full                                                 +---------+---------------+---------+-----------+----------+-------+  Left Venous Findings: +---------+---------------+---------+-----------+----------+-------+          CompressibilityPhasicitySpontaneityPropertiesSummary +---------+---------------+---------+-----------+----------+-------+ CFV      Full           Yes      Yes                           +---------+---------------+---------+-----------+----------+-------+ SFJ      Full                                                 +---------+---------------+---------+-----------+----------+-------+ FV Prox  Full           Yes      Yes                          +---------+---------------+---------+-----------+----------+-------+ FV Mid   Full                                                 +---------+---------------+---------+-----------+----------+-------+ FV DistalFull           Yes      Yes                          +---------+---------------+---------+-----------+----------+-------+ PFV      Full           Yes      Yes                          +---------+---------------+---------+-----------+----------+-------+ POP      Full           Yes      Yes                          +---------+---------------+---------+-----------+----------+-------+ PTV      Full                                                 +---------+---------------+---------+-----------+----------+-------+ PERO     Full                                                 +---------+---------------+---------+-----------+----------+-------+  Summary: Right: There is no evidence of deep vein thrombosis in the lower extremity. No cystic structure found in the popliteal fossa. Left: There is no evidence of deep vein thrombosis in the lower extremity. No cystic structure found in the popliteal fossa.  *See table(s) above for measurements and observations. Electronically signed by Curt Jews MD on 07/15/2018 at 8:52:48 AM.    Final     Assessment & Plan:   Elvin was seen today for follow-up and leg swelling.  Diagnoses and all orders for this visit:  Essential hypertension -     losartan (COZAAR) 100 MG tablet; Take 1 tablet (100 mg total) by mouth daily. -     metoprolol tartrate (LOPRESSOR) 100 MG tablet; Take 1 tablet (100 mg total) by mouth 2 (two) times daily.  Chronic ethmoidal  sinusitis -     montelukast (SINGULAIR) 10 MG tablet; Take 1 tablet (10 mg total) by mouth at bedtime.  Mixed hyperlipidemia -     pravastatin (PRAVACHOL) 40 MG tablet; TAKE 1 TABLET BY MOUTH IN THE EVENING  Asymptomatic age-related postmenopausal state -     DG Bone Density; Future  Generalized anxiety disorder -     ALPRAZolam (XANAX) 1 MG tablet; Take 0.5 tablets (0.5 mg total) by mouth every 8 (eight) hours as needed for anxiety.   I have discontinued Rudi Rummage. Roberts's vitamin E, ascorbic acid, nitroGLYCERIN, BIOTIN PO, Magnesium Oxide, potassium chloride SA, valACYclovir, and promethazine-dextromethorphan. I am also having her maintain her Calcium Carbonate-Vitamin D, aspirin EC, acetaminophen, pantoprazole, Restasis, vitamin B-12, EPINEPHrine, benzonatate, Ventolin HFA, cetirizine, amLODipine, budesonide-formoterol, guaiFENesin, meclizine, benzocaine-menthol, nystatin, doxycycline, losartan, metoprolol tartrate, montelukast, pravastatin, and ALPRAZolam.  Meds ordered this encounter  Medications  . losartan (COZAAR) 100 MG tablet    Sig: Take 1 tablet (100 mg total) by mouth daily.    Dispense:  90 tablet    Refill:  3    Order Specific Question:   Supervising Provider    Answer:   Lucille Passy [[id number]]  . metoprolol tartrate (LOPRESSOR) 100 MG tablet    Sig: Take 1 tablet (100 mg total) by mouth 2 (two) times daily.    Dispense:  180 tablet    Refill:  3    Order Specific Question:   Supervising Provider    Answer:   Lucille Passy [[id number]]  . montelukast (SINGULAIR) 10 MG tablet    Sig: Take 1 tablet (10 mg total) by mouth at bedtime.    Dispense:  30 tablet    Refill:  3    Order Specific Question:   Supervising Provider    Answer:   Lucille Passy [[id number]]  . pravastatin (PRAVACHOL) 40 MG tablet    Sig: TAKE 1 TABLET BY MOUTH IN THE EVENING    Dispense:  90 tablet    Refill:  3    Order Specific Question:   Supervising Provider    Answer:   Lucille Passy [[id number]]  .  ALPRAZolam (XANAX) 1 MG tablet    Sig: Take 0.5 tablets (0.5 mg total) by mouth every 8 (eight) hours as needed for anxiety.    Dispense:  45 tablet    Refill:  1    Order Specific Question:   Supervising Provider    Answer:   Lucille Passy [[id number]]    Problem List Items Addressed This Visit      Cardiovascular and Mediastinum   Essential hypertension - Primary (Chronic)   Relevant Medications  losartan (COZAAR) 100 MG tablet   metoprolol tartrate (LOPRESSOR) 100 MG tablet   pravastatin (PRAVACHOL) 40 MG tablet     Respiratory   Chronic ethmoidal sinusitis (Chronic)   Relevant Medications   montelukast (SINGULAIR) 10 MG tablet     Other   HLD (hyperlipidemia)   Relevant Medications   losartan (COZAAR) 100 MG tablet   metoprolol tartrate (LOPRESSOR) 100 MG tablet   pravastatin (PRAVACHOL) 40 MG tablet    Other Visit Diagnoses    Asymptomatic age-related postmenopausal state       Relevant Orders   DG Bone Density   Generalized anxiety disorder  (Chronic)      Relevant Medications   ALPRAZolam (XANAX) 1 MG tablet       Follow-up: Return in about 3 months (around 02/16/2019) for HTN and anxiety (40mins).  Wilfred Lacy, NP

## 2018-11-16 NOTE — Patient Instructions (Addendum)
You will be contacted to schedule appt for bone density.  Elevated legs as much as possible to improve LE edema. Wear compression stocking during the day and off at bedtime. Maintain DASH diet.

## 2018-11-20 ENCOUNTER — Ambulatory Visit (INDEPENDENT_AMBULATORY_CARE_PROVIDER_SITE_OTHER): Payer: Medicare HMO | Admitting: Psychiatry

## 2018-11-20 DIAGNOSIS — F4323 Adjustment disorder with mixed anxiety and depressed mood: Secondary | ICD-10-CM | POA: Diagnosis not present

## 2018-11-21 ENCOUNTER — Ambulatory Visit: Payer: Medicare HMO | Admitting: Psychiatry

## 2018-11-22 ENCOUNTER — Other Ambulatory Visit: Payer: Self-pay

## 2018-11-22 ENCOUNTER — Encounter: Payer: Self-pay | Admitting: Pulmonary Disease

## 2018-11-22 ENCOUNTER — Ambulatory Visit (INDEPENDENT_AMBULATORY_CARE_PROVIDER_SITE_OTHER): Payer: Medicare HMO | Admitting: Pulmonary Disease

## 2018-11-22 VITALS — BP 120/74 | HR 90 | Ht 63.0 in | Wt 174.0 lb

## 2018-11-22 DIAGNOSIS — J4530 Mild persistent asthma, uncomplicated: Secondary | ICD-10-CM

## 2018-11-22 DIAGNOSIS — R0602 Shortness of breath: Secondary | ICD-10-CM

## 2018-11-22 LAB — PULMONARY FUNCTION TEST
DL/VA % pred: 120 %
DL/VA: 5.06 ml/min/mmHg/L
DLCO cor % pred: 100 %
DLCO cor: 19.29 ml/min/mmHg
DLCO unc % pred: 100 %
DLCO unc: 19.23 ml/min/mmHg
FEF 25-75 Post: 1.87 L/sec
FEF 25-75 Pre: 2.41 L/sec
FEF2575-%CHANGE-POST: -22 %
FEF2575-%Pred-Post: 90 %
FEF2575-%Pred-Pre: 116 %
FEV1-%Change-Post: -3 %
FEV1-%Pred-Post: 85 %
FEV1-%Pred-Pre: 88 %
FEV1-Post: 1.99 L
FEV1-Pre: 2.07 L
FEV1FVC-%Change-Post: 6 %
FEV1FVC-%Pred-Pre: 105 %
FEV6-%Change-Post: -9 %
FEV6-%Pred-Post: 78 %
FEV6-%Pred-Pre: 86 %
FEV6-Post: 2.29 L
FEV6-Pre: 2.53 L
FEV6FVC-%Change-Post: 0 %
FEV6FVC-%PRED-POST: 104 %
FEV6FVC-%Pred-Pre: 103 %
FVC-%Change-Post: -9 %
FVC-%PRED-POST: 75 %
FVC-%PRED-PRE: 83 %
FVC-POST: 2.29 L
FVC-PRE: 2.53 L
POST FEV6/FVC RATIO: 100 %
PRE FEV1/FVC RATIO: 82 %
Post FEV1/FVC ratio: 87 %
Pre FEV6/FVC Ratio: 100 %
RV % pred: 83 %
RV: 1.7 L
TLC % PRED: 85 %
TLC: 4.21 L

## 2018-11-22 MED ORDER — VENTOLIN HFA 108 (90 BASE) MCG/ACT IN AERS: 2.0000 | INHALATION_SPRAY | Freq: Four times a day (QID) | RESPIRATORY_TRACT | 3 refills | Status: DC | PRN

## 2018-11-22 MED ORDER — FLUTICASONE PROPIONATE HFA 110 MCG/ACT IN AERO: 2.0000 | INHALATION_SPRAY | Freq: Two times a day (BID) | RESPIRATORY_TRACT | 6 refills | Status: DC

## 2018-11-22 NOTE — Patient Instructions (Addendum)
Mild Persistent Asthma Although your Pulmonary Function Test are not consistent with asthma, I believe your clinical symptoms are sufficient for diagnosis. We will still need to rule out vocal cord dysfunction as your flow-volume loops show variable extrathoracic obstruction.  -We will increase your steroid inhaler (Flovent 110 mcg) to 2 puffs twice a day -We reviewed your action plan including using Albuterol inhaler as your rescue inhaler. If the albuterol does not help, OK to administer extra 2 puffs from your Flovent inhaler. Please call our office if you are consistently needing to use extra puffs. -I agree with consult with ENT. Please have the doctors consider evaluation of your vocal cords to rule out vocal cord dysfunction that may be contributing to your symptoms.
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
## 2018-11-22 NOTE — Progress Notes (Signed)
Full PFT performed today. °

## 2018-11-23 ENCOUNTER — Other Ambulatory Visit: Payer: Self-pay | Admitting: Nurse Practitioner

## 2018-11-23 DIAGNOSIS — E2839 Other primary ovarian failure: Secondary | ICD-10-CM

## 2018-11-27 ENCOUNTER — Telehealth: Payer: Self-pay | Admitting: Pulmonary Disease

## 2018-11-27 DIAGNOSIS — H6981 Other specified disorders of Eustachian tube, right ear: Secondary | ICD-10-CM | POA: Diagnosis not present

## 2018-11-27 DIAGNOSIS — H9313 Tinnitus, bilateral: Secondary | ICD-10-CM | POA: Diagnosis not present

## 2018-11-27 DIAGNOSIS — Z87891 Personal history of nicotine dependence: Secondary | ICD-10-CM | POA: Diagnosis not present

## 2018-11-27 DIAGNOSIS — J383 Other diseases of vocal cords: Secondary | ICD-10-CM | POA: Diagnosis not present

## 2018-11-27 NOTE — Telephone Encounter (Signed)
Pt is calling back [phone]

## 2018-11-27 NOTE — Telephone Encounter (Signed)
Called patient, unable to reach and unable to leave voicemail.

## 2018-11-27 NOTE — Telephone Encounter (Signed)
Unable to reach patient. Voicemail box is full.

## 2018-11-27 NOTE — Telephone Encounter (Signed)
Called patient unable to reach and unable to leave message.

## 2018-11-28 NOTE — Telephone Encounter (Signed)
Spoke with patient. She is requesting a letter to remind out of work due to the coronavirus and her having asthma. Advised her I would have to check with Dr. Loanne Drilling first, she verbalized understanding.   Dr. Loanne Drilling, please advise if you are ok with such a letter.

## 2018-11-28 NOTE — Telephone Encounter (Signed)
PT calling back [phone]//kob

## 2018-11-29 ENCOUNTER — Telehealth: Payer: Self-pay | Admitting: Nurse Practitioner

## 2018-11-29 NOTE — Telephone Encounter (Signed)
In the absence of any symptoms, This does not meet the criteria to be exempt from work or certain duties at work. I strongly encourage proper hand hygiene, and avoid touching face at this time.

## 2018-11-29 NOTE — Telephone Encounter (Signed)
Copied from [place] [id number]. Topic: Quick Communication - See Telephone Encounter >> Nov 29, 2018  7:55 AM Rayann Heman wrote: CRM for notification. See Telephone encounter for: 11/29/18. Pt called and stated that pt would like a note that to her employer that states she needs to be taken out of work until the covid-19 passes over. Pt states that she has multiple health issues

## 2018-11-29 NOTE — Telephone Encounter (Signed)
Pt stated she is working with the public ([employer]) and she was wondering if Baldo Ash can give her out of work note due to her health conditions. Please advise.

## 2018-11-29 NOTE — Telephone Encounter (Signed)
Pt is aware.  

## 2018-11-30 NOTE — Telephone Encounter (Signed)
Spoke with the pt and notified of recs per Dr Loanne Drilling  She denied any symptoms at this time  Will call if needed

## 2018-11-30 NOTE — Telephone Encounter (Signed)
Is she having symptoms (fever, cough, shortness of breath, wheezing)? If not then would advise, proper hand washing, avoiding large crowds and sick contacts and limiting travel for coronovirus prevention.  Reduce your risk of any infection by using the same precautions used for avoiding the common cold or flu:  Marland Kitchen Wash your hands often with soap and warm water for at least 20 seconds.  If soap and water are not readily available, use an alcohol-based hand sanitizer with at least 60% alcohol.  . If coughing or sneezing, cover your mouth and nose by coughing or sneezing into the elbow areas of your shirt or coat, into a tissue or into your sleeve (not your hands). . Avoid shaking hands with others and consider head nods or verbal greetings only. . Avoid touching your eyes, nose, or mouth with unwashed hands.  . Avoid close contact with people who are sick. . Avoid places or events with large numbers of people in one location, like concerts or sporting events. . Carefully consider travel plans you have or are making. . If you are planning any travel outside or inside the [place], visit the CDC's Travelers' Health webpage for the latest health notices. . If you have some symptoms but not all symptoms, continue to monitor at home and seek medical attention if your symptoms worsen. . If you are having a medical emergency, call 911.

## 2018-12-05 ENCOUNTER — Ambulatory Visit: Payer: Medicare HMO | Admitting: Psychiatry

## 2018-12-19 ENCOUNTER — Ambulatory Visit: Payer: Medicare HMO | Admitting: Neurology

## 2018-12-24 IMAGING — NM NM PULMONARY VENT & PERF
16 series · 16 of 16 positions shown · non-contrast
Comparison: Comparison made with prior radiograph from earlier the
same day.

CLINICAL DATA: Initial evaluation for acute chest pressure,
suspected PE.

EXAM:
NUCLEAR MEDICINE VENTILATION - PERFUSION LUNG SCAN
TECHNIQUE: Ventilation images were obtained in multiple projections using
inhaled aerosol 1c-OOm DTPA. Perfusion images were obtained in
multiple projections after intravenous injection of [id number].
RADIOPHARMACEUTICALS:  32.3 mCi of 1c-OOm DTPA aerosol inhalation
and 4.31 mCi [id number] IV

[Series 1: ant-post vent · 2.07mm/px · 1 of 1 slices shown (1 of 2)]
[im 1/1  full-range]
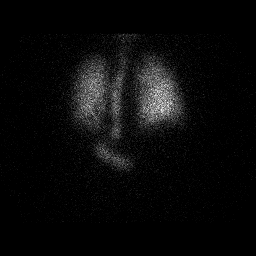

[Series 1: ant-post vent · 2.07mm/px · 1 of 1 slices shown (2 of 2)]
[im 1/1]
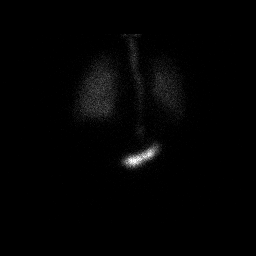

[Series 2: lao-rpo vent · 2.07mm/px · 1 of 1 slices shown (1 of 2)]
[im 1/1]
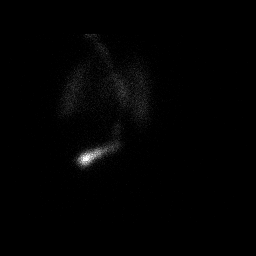

[Series 2: lao-rpo vent · 2.07mm/px · 1 of 1 slices shown (2 of 2)]
[im 1/1  full-range]
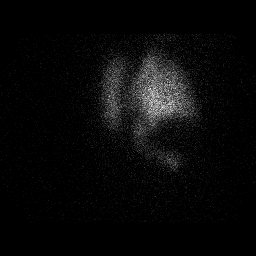

[Series 3: rao-lpo vent · 2.07mm/px · 1 of 1 slices shown (1 of 2)]
[im 1/1  full-range]
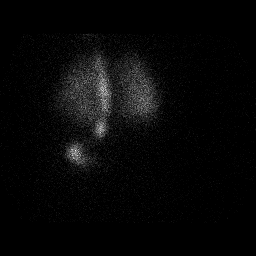

[Series 3: rao-lpo vent · 2.07mm/px · 1 of 1 slices shown (2 of 2)]
[im 1/1]
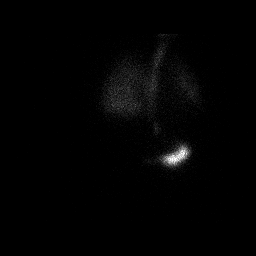

[Series 4: rlat-llat vent · 2.07mm/px · 1 of 1 slices shown (1 of 2)]
[im 1/1]
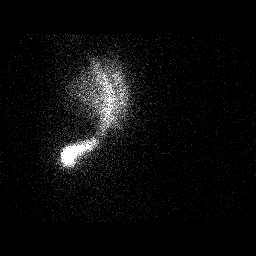

[Series 4: rlat-llat vent · 2.07mm/px · 1 of 1 slices shown (2 of 2)]
[im 1/1  full-range]
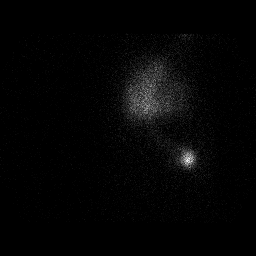

[Series 5: rlat-llat perf · 2.07mm/px · 1 of 1 slices shown (1 of 2)]
[im 1/1]
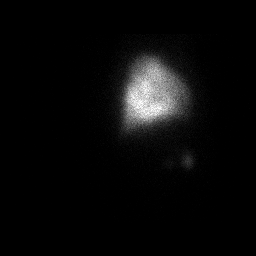

[Series 5: rlat-llat perf · 2.07mm/px · 1 of 1 slices shown (2 of 2)]
[im 1/1]
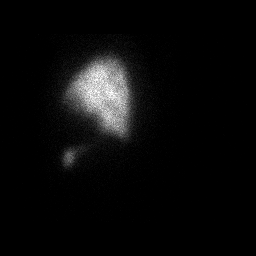

[Series 6: rao-lpo perf · 2.07mm/px · 1 of 1 slices shown (1 of 2)]
[im 1/1]
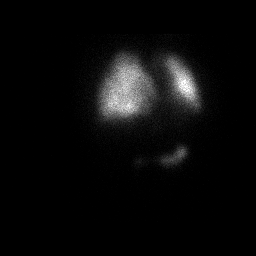

[Series 6: rao-lpo perf · 2.07mm/px · 1 of 1 slices shown (2 of 2)]
[im 1/1]
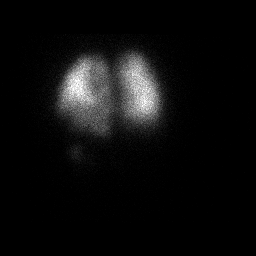

[Series 7: ant-post perf · 2.07mm/px · 1 of 1 slices shown (1 of 2)]
[im 1/1]
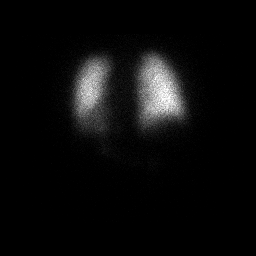

[Series 7: ant-post perf · 2.07mm/px · 1 of 1 slices shown (2 of 2)]
[im 1/1]
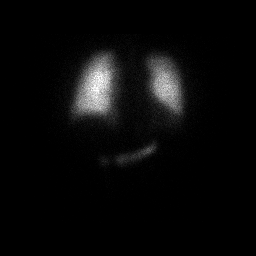

[Series 8: lao-rpo perf · 2.07mm/px · 1 of 1 slices shown (1 of 2)]
[im 1/1]
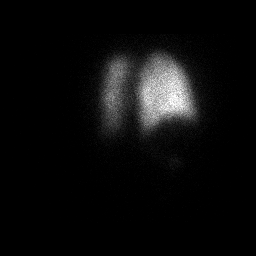

[Series 8: lao-rpo perf · 2.07mm/px · 1 of 1 slices shown (2 of 2)]
[im 1/1]
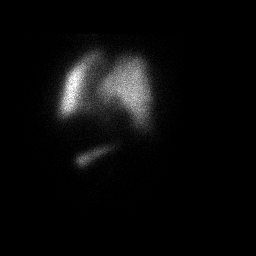

[16 of 16 positions shown; findings below may reference images not displayed]

FINDINGS: Ventilation: No focal ventilation defect.

Perfusion: No wedge shaped peripheral perfusion defects to suggest
acute pulmonary embolism.
IMPRESSION: Negative VQ scan. No imaging findings to suggest pulmonary embolism
identified.

## 2019-01-07 ENCOUNTER — Telehealth: Payer: Self-pay | Admitting: *Deleted

## 2019-01-07 NOTE — Telephone Encounter (Signed)
Follow up  ° ° °Pt is returning call  ° ° °Please call back  °

## 2019-01-07 NOTE — Telephone Encounter (Signed)
LMOM @ 10:29 am, re: appointment.

## 2019-01-08 ENCOUNTER — Ambulatory Visit: Payer: Medicare HMO | Admitting: Psychiatry
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
## 2019-01-09 ENCOUNTER — Encounter: Payer: Self-pay | Admitting: Nurse Practitioner

## 2019-01-09 ENCOUNTER — Ambulatory Visit (INDEPENDENT_AMBULATORY_CARE_PROVIDER_SITE_OTHER): Payer: Medicare HMO | Admitting: Nurse Practitioner

## 2019-01-09 ENCOUNTER — Ambulatory Visit (INDEPENDENT_AMBULATORY_CARE_PROVIDER_SITE_OTHER): Payer: Medicare HMO | Admitting: *Deleted

## 2019-01-09 ENCOUNTER — Encounter: Payer: Self-pay | Admitting: *Deleted

## 2019-01-09 ENCOUNTER — Telehealth: Payer: Self-pay | Admitting: Cardiology

## 2019-01-09 VITALS — BP 170/89 | Ht 63.0 in

## 2019-01-09 DIAGNOSIS — R600 Localized edema: Secondary | ICD-10-CM | POA: Diagnosis not present

## 2019-01-09 DIAGNOSIS — Z0000 Encounter for general adult medical examination without abnormal findings: Secondary | ICD-10-CM

## 2019-01-09 DIAGNOSIS — I10 Essential (primary) hypertension: Secondary | ICD-10-CM | POA: Diagnosis not present

## 2019-01-09 DIAGNOSIS — M722 Plantar fascial fibromatosis: Secondary | ICD-10-CM | POA: Diagnosis not present

## 2019-01-09 MED ORDER — TRIAMTERENE-HCTZ 37.5-25 MG PO TABS: 1.0000 | ORAL_TABLET | Freq: Every day | ORAL | 0 refills | Status: DC

## 2019-01-09 NOTE — Progress Notes (Signed)
Virtual Visit via Video Note  I connected with Mitchel Honour on 01/09/19 at  4:30 PM EDT by a video enabled telemedicine application and verified that I am speaking with the correct person using two identifiers.  Location: Patient: office Provider: home   I discussed the limitations of evaluation and management by telemedicine and the availability of in person appointments. The patient expressed understanding and agreed to proceed.  CC: pt is c/o of left and right leg pain, swelling--2 wks/ epipen and pneumonia inj consult. pt will have her BP reading during ov.   History of Present Illness:  Leg Pain   The incident occurred more than 1 week ago. There was no injury mechanism. The pain is present in the left foot and left heel. The quality of the pain is described as aching and cramping. The pain has been intermittent since onset. Associated symptoms include an inability to bear weight. Pertinent negatives include no loss of motion, loss of sensation, muscle weakness, numbness or tingling. She reports no foreign bodies present. The symptoms are aggravated by weight bearing. She has tried rest for the symptoms. The treatment provided mild relief.  foot pain is worse with walking.  HTN: Elevated today. Denies any change in diet, maintains DASh diet. Reports compliance with amlodipine, losartan and metoprolol. BP Readings from Last 3 Encounters:  01/09/19 (!) 170/89  11/22/18 120/74  11/16/18 138/82   Review of Systems  Constitutional: Negative.   Respiratory: Negative.   Cardiovascular: Negative.   Musculoskeletal: Negative for falls.  Neurological: Negative for dizziness, tingling, numbness and headaches.   Observations/Objective: Physical Exam  Constitutional: She is oriented to person, place, and time.  Pulmonary/Chest: Effort normal.  Musculoskeletal:        General: Edema present.  Neurological: She is alert and oriented to person, place, and time.  Skin: No rash noted.  No erythema.  Skin intact. Bilateral ankle and foot swelling.  Psychiatric: She has a normal mood and affect. Her behavior is normal.  Vitals reviewed.  Assessment and Plan: [place] was seen today for leg pain.  Diagnoses and all orders for this visit:  Essential hypertension -     triamterene-hydrochlorothiazide (MAXZIDE-25) 37.5-25 MG tablet; Take 1 tablet by mouth daily.  Bilateral leg edema -     triamterene-hydrochlorothiazide (MAXZIDE-25) 37.5-25 MG tablet; Take 1 tablet by mouth daily.  Plantar fasciitis of left foot   Follow Up Instructions: Ok to proceed with Prevnar -13 injection. In absence of food allergy or anaphylactic reaction to bee or wasp, Epipen prescription is not necessary. Take amlodipine at hs Send BP readings on Friday via mychart. Start maxzide 1tab day x5days. Use cold compress massage in morning and foot stretches once a day. Will refer to podiatry if no improvement in foot pain   I discussed the assessment and treatment plan with the patient. The patient was provided an opportunity to ask questions and all were answered. The patient agreed with the plan and demonstrated an understanding of the instructions.   The patient was advised to call back or seek an in-person evaluation if the symptoms worsen or if the condition fails to improve as anticipated.   Wilfred Lacy, NP

## 2019-01-09 NOTE — Telephone Encounter (Signed)
Smartphone/consent/ my chart/ pre reg completed °

## 2019-01-09 NOTE — Patient Instructions (Addendum)
Ok to proceed with Prevnar -13 injection. In absence of food allergy or anaphylactic reaction to bee or wasp, Epipen prescription is not necessary. Take amlodipine at hs Send BP readings on Friday via mychart. Start maxzide 1tab day x5days. Use cold compress massage in morning and foot stretches once a day. Will refer to podiatry if no improvement in foot pain   Plantar Fasciitis Rehab Ask your health care provider which exercises are safe for you. Do exercises exactly as told by your health care provider and adjust them as directed. It is normal to feel mild stretching, pulling, tightness, or discomfort as you do these exercises, but you should stop right away if you feel sudden pain or your pain gets worse. Do not begin these exercises until told by your health care provider. Stretching and range of motion exercises These exercises warm up your muscles and joints and improve the movement and flexibility of your foot. These exercises also help to relieve pain. Exercise A: Plantar fascia stretch  1. Sit with your left / right leg crossed over your opposite knee. 2. Hold your heel with one hand with that thumb near your arch. With your other hand, hold your toes and gently pull them back toward the top of your foot. You should feel a stretch on the bottom of your toes or your foot or both. 3. Hold this stretch for___10_______ seconds. 4. Slowly release your toes and return to the starting position. Repeat ____10______ times. Complete this exercise _____1_____ times a day. Exercise B: Gastroc, standing  1. Stand with your hands against a wall. 2. Extend your left / right leg behind you, and bend your front knee slightly. 3. Keeping your heels on the floor and keeping your back knee straight, shift your weight toward the wall without arching your back. You should feel a gentle stretch in your left / right calf. 4. Hold this position for __________ seconds. Repeat __________ times. Complete this  exercise __________ times a day. Exercise C: Soleus, standing 1. Stand with your hands against a wall. 2. Extend your left / right leg behind you, and bend your front knee slightly. 3. Keeping your heels on the floor, bend your back knee and slightly shift your weight over the back leg. You should feel a gentle stretch deep in your calf. 4. Hold this position for __________ seconds. Repeat __________ times. Complete this exercise __________ times a day. Exercise D: Gastrocsoleus, standing 1. Stand with the ball of your left / right foot on a step. The ball of your foot is on the walking surface, right under your toes. 2. Keep your other foot firmly on the same step. 3. Hold onto the wall or a railing for balance. 4. Slowly lift your other foot, allowing your body weight to press your heel down over the edge of the step. You should feel a stretch in your left / right calf. 5. Hold this position for __________ seconds. 6. Return both feet to the step. 7. Repeat this exercise with a slight bend in your left / right knee. Repeat __________ times with your left / right knee straight and __________ times with your left / right knee bent. Complete this exercise __________ times a day. Balance exercise This exercise builds your balance and strength control of your arch to help take pressure off your plantar fascia. Exercise E: Single leg stand 1. Without shoes, stand near a railing or in a doorway. You may hold onto the railing or door frame  as needed. 2. Stand on your left / right foot. Keep your big toe down on the floor and try to keep your arch lifted. Do not let your foot roll inward. 3. Hold this position for __________ seconds. 4. If this exercise is too easy, you can try it with your eyes closed or while standing on a pillow. Repeat __________ times. Complete this exercise __________ times a day. This information is not intended to replace advice given to you by your health care provider.  Make sure you discuss any questions you have with your health care provider. Document Released: 08/29/2005 Document Revised: 05/03/2016 Document Reviewed: 07/13/2015 Elsevier Interactive Patient Education  2019 [employer].

## 2019-01-09 NOTE — Patient Instructions (Signed)
See you next year!  Pt c/o left foot issues x2 wk. Will discuss w/ PCP today at 430 appt.  Continue to eat heart healthy diet (full of fruits, vegetables, whole grains, lean protein, water--limit salt, fat, and sugar intake) and increase physical activity as tolerated.  Continue doing brain stimulating activities (puzzles, reading, adult coloring books, staying active) to keep memory sharp.    [name] , Thank you for taking time to come for your Medicare Wellness Visit. I appreciate your ongoing commitment to your health goals. Please review the following plan we discussed and let me know if I can assist you in the future.   These are the goals we discussed: Goals    . restart part time job       This is a list of the screening recommended for you and due dates:  Health Maintenance  Topic Date Due  . DEXA scan (bone density measurement)  04/23/2018  . Pneumonia vaccines (1 of 2 - PCV13) 04/23/2018  . Flu Shot  04/13/2019  . Cologuard (Stool DNA test)  10/18/2019  . Mammogram  06/15/2020  . Tetanus Vaccine  01/31/2028  .  Hepatitis C: One time screening is recommended by Center for Disease Control  (CDC) for  adults born from [birth year] through 1963.   Completed  . HIV Screening  Completed    Health Maintenance After Age 9 After age 73, you are at a higher risk for certain long-term diseases and infections as well as injuries from falls. Falls are a major cause of broken bones and head injuries in people who are older than age 15. Getting regular preventive care can help to keep you healthy and well. Preventive care includes getting regular testing and making lifestyle changes as recommended by your health care provider. Talk with your health care provider about:  Which screenings and tests you should have. A screening is a test that checks for a disease when you have no symptoms.  A diet and exercise plan that is right for you. What should I know about screenings and tests to  prevent falls? Screening and testing are the best ways to find a health problem early. Early diagnosis and treatment give you the best chance of managing medical conditions that are common after age 42. Certain conditions and lifestyle choices may make you more likely to have a fall. Your health care provider may recommend:  Regular vision checks. Poor vision and conditions such as cataracts can make you more likely to have a fall. If you wear glasses, make sure to get your prescription updated if your vision changes.  Medicine review. Work with your health care provider to regularly review all of the medicines you are taking, including over-the-counter medicines. Ask your health care provider about any side effects that may make you more likely to have a fall. Tell your health care provider if any medicines that you take make you feel dizzy or sleepy.  Osteoporosis screening. Osteoporosis is a condition that causes the bones to get weaker. This can make the bones weak and cause them to break more easily.  Blood pressure screening. Blood pressure changes and medicines to control blood pressure can make you feel dizzy.  Strength and balance checks. Your health care provider may recommend certain tests to check your strength and balance while standing, walking, or changing positions.  Foot health exam. Foot pain and numbness, as well as not wearing proper footwear, can make you more likely to have a fall.  Depression screening. You may be more likely to have a fall if you have a fear of falling, feel emotionally low, or feel unable to do activities that you used to do.  Alcohol use screening. Using too much alcohol can affect your balance and may make you more likely to have a fall. What actions can I take to lower my risk of falls? General instructions  Talk with your health care provider about your risks for falling. Tell your health care provider if: ? You fall. Be sure to tell your health  care provider about all falls, even ones that seem minor. ? You feel dizzy, sleepy, or off-balance.  Take over-the-counter and prescription medicines only as told by your health care provider. These include any supplements.  Eat a healthy diet and maintain a healthy weight. A healthy diet includes low-fat dairy products, low-fat (lean) meats, and fiber from whole grains, beans, and lots of fruits and vegetables. Home safety  Remove any tripping hazards, such as rugs, cords, and clutter.  Install safety equipment such as grab bars in bathrooms and safety rails on stairs.  Keep rooms and walkways well-lit. Activity   Follow a regular exercise program to stay fit. This will help you maintain your balance. Ask your health care provider what types of exercise are appropriate for you.  If you need a cane or walker, use it as recommended by your health care provider.  Wear supportive shoes that have nonskid soles. Lifestyle  Do not drink alcohol if your health care provider tells you not to drink.  If you drink alcohol, limit how much you have: ? 0-1 drink a day for women. ? 0-2 drinks a day for men.  Be aware of how much alcohol is in your drink. In the [place] one drink equals one typical bottle of beer (12 oz), one-half glass of wine (5 oz), or one shot of hard liquor (1 oz).  Do not use any products that contain nicotine or tobacco, such as cigarettes and e-cigarettes. If you need help quitting, ask your health care provider. Summary  Having a healthy lifestyle and getting preventive care can help to protect your health and wellness after age 43.  Screening and testing are the best way to find a health problem early and help you avoid having a fall. Early diagnosis and treatment give you the best chance for managing medical conditions that are more common for people who are older than age 50.  Falls are a major cause of broken bones and head injuries in people who are older than age  43. Take precautions to prevent a fall at home.  Work with your health care provider to learn what changes you can make to improve your health and wellness and to prevent falls. This information is not intended to replace advice given to you by your health care provider. Make sure you discuss any questions you have with your health care provider. Document Released: 07/12/2017 Document Revised: 07/12/2017 Document Reviewed: 07/12/2017 Elsevier Interactive Patient Education  2019 [employer].
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
## 2019-01-09 NOTE — Progress Notes (Signed)
Medical screening examination/treatment/procedure(s) were performed by the Wellness Coach, RN. As primary care provider I was immediately available for consulation/collaboration. I agree with above documentation. Brennan Litzinger, AGNP-C 

## 2019-01-10 ENCOUNTER — Telehealth (INDEPENDENT_AMBULATORY_CARE_PROVIDER_SITE_OTHER): Payer: Medicare HMO | Admitting: Cardiology

## 2019-01-10 ENCOUNTER — Encounter: Payer: Self-pay | Admitting: Cardiology

## 2019-01-10 ENCOUNTER — Ambulatory Visit: Payer: Medicare HMO | Admitting: Neurology

## 2019-01-10 VITALS — BP 169/72 | HR 52 | Ht 63.0 in | Wt 173.0 lb

## 2019-01-10 DIAGNOSIS — Z7189 Other specified counseling: Secondary | ICD-10-CM

## 2019-01-10 DIAGNOSIS — I10 Essential (primary) hypertension: Secondary | ICD-10-CM

## 2019-01-10 DIAGNOSIS — R072 Precordial pain: Secondary | ICD-10-CM | POA: Diagnosis not present

## 2019-01-10 DIAGNOSIS — R002 Palpitations: Secondary | ICD-10-CM

## 2019-01-10 DIAGNOSIS — M7989 Other specified soft tissue disorders: Secondary | ICD-10-CM | POA: Insufficient documentation

## 2019-01-10 NOTE — Patient Instructions (Addendum)
Medication Instructions:  Continue current medications  If you need a refill on your cardiac medications before your next appointment, please call your pharmacy.  Labwork: None Ordered   Testing/Procedures: None Ordered  Follow-Up: . Your physician recommends that you schedule a follow-up appointment in: May 8th 10:40 am   At [facility], you and your health needs are our priority.  As part of our continuing mission to provide you with exceptional heart care, we have created designated Provider Care Teams.  These Care Teams include your primary Cardiologist (physician) and Advanced Practice Providers (APPs -  Physician Assistants and Nurse Practitioners) who all work together to provide you with the care you need, when you need it.  Thank you for choosing [facility] at [facility]!!

## 2019-01-11 ENCOUNTER — Telehealth: Payer: Self-pay | Admitting: Nurse Practitioner

## 2019-01-11 ENCOUNTER — Telehealth: Payer: Self-pay

## 2019-01-11 NOTE — Telephone Encounter (Signed)
Copied from [place] [phone]. Topic: General - Inquiry >> Jan 11, 2019 11:57 AM Scherrie Gerlach wrote: Reason for CRM: pt was to call back with bp readings, this is with no medicine: 169/88  p 68 Please let [place] know.  Pt will call back this afternoon with readings after she takes her bp medicine

## 2019-01-11 NOTE — Telephone Encounter (Signed)
Pt is aware, she will finish her MAXZIDE and keep an eye on her BP--and report to PCP

## 2019-01-11 NOTE — Telephone Encounter (Signed)
Continue with current medications

## 2019-01-11 NOTE — Telephone Encounter (Signed)

## 2019-01-11 NOTE — Telephone Encounter (Signed)
Patient called and would like to give her BP reading of this afternoon at 3pm it was 143/80 with a pulse of 66.

## 2019-01-15 ENCOUNTER — Ambulatory Visit (INDEPENDENT_AMBULATORY_CARE_PROVIDER_SITE_OTHER): Payer: Medicare HMO

## 2019-01-15 DIAGNOSIS — Z23 Encounter for immunization: Secondary | ICD-10-CM | POA: Diagnosis not present

## 2019-01-15 NOTE — Progress Notes (Signed)
Pt came in today for prevnar 13 injection. Pt was given immunization in right deltoid and handled well. Went over pt allergies and pt was given information sheet for prevnar- 13. [name] gave verbal okay for this immunization.
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
## 2019-01-17 ENCOUNTER — Ambulatory Visit: Payer: Medicare HMO | Admitting: Cardiology

## 2019-01-18 ENCOUNTER — Ambulatory Visit: Payer: Medicare HMO | Admitting: Cardiology

## 2019-01-21 ENCOUNTER — Other Ambulatory Visit: Payer: Self-pay | Admitting: Internal Medicine

## 2019-01-21 ENCOUNTER — Ambulatory Visit (INDEPENDENT_AMBULATORY_CARE_PROVIDER_SITE_OTHER): Payer: Medicare HMO | Admitting: Nurse Practitioner

## 2019-01-21 ENCOUNTER — Encounter: Payer: Self-pay | Admitting: Nurse Practitioner

## 2019-01-21 DIAGNOSIS — R600 Localized edema: Secondary | ICD-10-CM

## 2019-01-21 DIAGNOSIS — I10 Essential (primary) hypertension: Secondary | ICD-10-CM | POA: Diagnosis not present

## 2019-01-21 MED ORDER — TRIAMTERENE-HCTZ 37.5-25 MG PO TABS: 1.0000 | ORAL_TABLET | Freq: Every day | ORAL | 0 refills | Status: DC

## 2019-01-21 NOTE — Progress Notes (Signed)
Virtual Visit via Video Note  I connected with [name] on 01/21/19 at  3:30 PM EDT by a video enabled telemedicine application and verified that I am speaking with the correct person using two identifiers.  Location: Patient: home Provider: office   I discussed the limitations of evaluation and management by telemedicine and the availability of in person appointments. The patient expressed understanding and agreed to proceed.  History of Present Illness: HTN:  waxing and waning Report BP reading of 150/72 while taking maxzide. Did not take change amlodipine to HS Denies high sodium diet. BP Readings from Last 3 Encounters:  01/21/19 (!) 179/89  01/10/19 (!) 169/72  01/09/19 (!) 170/89   LE edema: Initially resolved with use of maxzide x 5days Worsening after completion, Associated to pain (burning sensation), pain resolved in absence of swelling.  Review of Systems  Respiratory: Negative.   Cardiovascular: Negative.   Neurological: Negative.      Observations/Objective: Physical Exam  Constitutional: She is oriented to person, place, and time. No distress.  Pulmonary/Chest: Effort normal.  Musculoskeletal:        General: Edema present.  Neurological: She is alert and oriented to person, place, and time.  Psychiatric: She has a normal mood and affect. Her behavior is normal. Thought content normal.  Vitals reviewed.   Assessment and Plan: Tamiki was seen today for leg swelling.  Diagnoses and all orders for this visit:  Essential hypertension -     Basic metabolic panel; Future -     triamterene-hydrochlorothiazide (MAXZIDE-25) 37.5-25 MG tablet; Take 1 tablet by mouth daily.  Bilateral leg edema -     Basic metabolic panel; Future -     triamterene-hydrochlorothiazide (MAXZIDE-25) 37.5-25 MG tablet; Take 1 tablet by mouth daily.   Follow Up Instructions: Take amlodipine at HS only. Use compression stocking during the day at off at night Go to lab for  blood draw. Consider switch maxzide to spironolactone if stable renal function   I discussed the assessment and treatment plan with the patient. The patient was provided an opportunity to ask questions and all were answered. The patient agreed with the plan and demonstrated an understanding of the instructions.   The patient was advised to call back or seek an in-person evaluation if the symptoms worsen or if the condition fails to improve as anticipated.   Wilfred Lacy, NP

## 2019-01-21 NOTE — Patient Instructions (Addendum)
Take amlodipine at HS only. Use compression stocking during the day at off at night Go to lab for blood draw. Consider switch maxzide to spironolactone if stable renal function

## 2019-01-22 ENCOUNTER — Telehealth: Payer: Self-pay | Admitting: Cardiology

## 2019-01-22 ENCOUNTER — Encounter: Payer: Self-pay | Admitting: Nurse Practitioner

## 2019-01-22 ENCOUNTER — Other Ambulatory Visit (INDEPENDENT_AMBULATORY_CARE_PROVIDER_SITE_OTHER): Payer: Medicare HMO

## 2019-01-22 DIAGNOSIS — R600 Localized edema: Secondary | ICD-10-CM

## 2019-01-22 DIAGNOSIS — I10 Essential (primary) hypertension: Secondary | ICD-10-CM

## 2019-01-22 LAB — BASIC METABOLIC PANEL
BUN: 15 mg/dL (ref 6–23)
CO2: 31 mEq/L (ref 19–32)
Calcium: 10 mg/dL (ref 8.4–10.5)
Chloride: 103 mEq/L (ref 96–112)
Creatinine, Ser: 0.86 mg/dL (ref 0.40–1.20)
GFR: 66.07 mL/min (ref >60.00)
Glucose, Bld: 123 mg/dL — ABNORMAL HIGH (ref 70–99)
Potassium: 4.4 mEq/L (ref 3.5–5.1)
Sodium: 139 mEq/L (ref 135–145)

## 2019-01-22 LAB — BRAIN NATRIURETIC PEPTIDE: Pro B Natriuretic peptide (BNP): 77 pg/mL (ref 0.0–100.0)

## 2019-01-22 MED ORDER — TRIAMTERENE-HCTZ 37.5-25 MG PO TABS: 1.0000 | ORAL_TABLET | Freq: Every day | ORAL | 2 refills | Status: DC
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
## 2019-01-22 NOTE — Addendum Note (Signed)
Addended by: Wilfred Lacy L on: 01/22/2019 02:14 PM   Modules accepted: Orders

## 2019-01-22 NOTE — Telephone Encounter (Signed)
Smartphone/consent/ my chart/ pre reg completed °

## 2019-01-22 NOTE — Progress Notes (Signed)
Patient and lab staff wore masks for lab visit per COVID-19 protocol. -DMG

## 2019-01-23 ENCOUNTER — Encounter: Payer: Self-pay | Admitting: Cardiology

## 2019-01-23 ENCOUNTER — Telehealth (INDEPENDENT_AMBULATORY_CARE_PROVIDER_SITE_OTHER): Payer: Medicare HMO | Admitting: Cardiology

## 2019-01-23 VITALS — BP 160/62 | HR 63 | Ht 63.0 in | Wt 178.0 lb

## 2019-01-23 DIAGNOSIS — I10 Essential (primary) hypertension: Secondary | ICD-10-CM

## 2019-01-23 DIAGNOSIS — R002 Palpitations: Secondary | ICD-10-CM | POA: Diagnosis not present

## 2019-01-23 DIAGNOSIS — R079 Chest pain, unspecified: Secondary | ICD-10-CM

## 2019-01-23 NOTE — Patient Instructions (Signed)
Medication Instructions:  Continue current medications  If you need a refill on your cardiac medications before your next appointment, please call your pharmacy.  Labwork: None Ordered   Testing/Procedures: None Ordered   Follow-Up: You will need a follow up appointment in 3 months.  Please call our office 2 months in advance to schedule this appointment.  You may see James Hochrein, MD or one of the following Advanced Practice Providers on your designated Care Team:   Rhonda Barrett, PA-C . Kathryn Lawrence, DNP, ANP     At [facility], you and your health needs are our priority.  As part of our continuing mission to provide you with exceptional heart care, we have created designated Provider Care Teams.  These Care Teams include your primary Cardiologist (physician) and Advanced Practice Providers (APPs -  Physician Assistants and Nurse Practitioners) who all work together to provide you with the care you need, when you need it.  Thank you for choosing [facility] at [facility]!!     

## 2019-01-25 ENCOUNTER — Other Ambulatory Visit: Payer: Self-pay

## 2019-01-25 ENCOUNTER — Ambulatory Visit: Payer: Medicare HMO | Attending: Otolaryngology

## 2019-01-25 DIAGNOSIS — R490 Dysphonia: Secondary | ICD-10-CM | POA: Diagnosis not present

## 2019-01-25 DIAGNOSIS — R1310 Dysphagia, unspecified: Secondary | ICD-10-CM | POA: Diagnosis not present

## 2019-01-25 NOTE — Patient Instructions (Signed)
VOICE CONSERVATION PROGRAM  1. Avoid overuse of voice or excessive use of the voice . The vocal cords can become easily fatigued. Try to sort out what is "necessary" versus "unnecessary" talking in your environment. You do not need to STOP talking, but try to limit it as much as possible.  . Think of resting your voice just as long as you talk. For example, if you talk for 5 minutes, rest your voice completely for 5 minutes. . If your voice feels "tired" during the day, try to rest it as much as possible.  2. Avoid using an excessively loud voice or shouting/raising your voice . When you yell or raise your voice, the vocal cords slam into each other, much like a strong hand clap. This causes irritation, and if this irritation continues, hoarseness may increase. . If people in your home talk loudly, ask them to reduce the volume of their voices to help you decrease your volume as well. . Sit near or face the person to whom you are speaking.   3. Avoid talking over background noise . When talking in background noise, speech automatically has increased loudness, and as in number 2 above, continued loud speech can result in increased hoarseness due to irritation to the vocal cords.  . Do not talk over the radio or the TV. Mute them before speaking, go to a quieter place to talk, or sit next to the person with whom you are watching.  4. Talk in a voice that is soft, smooth, and gentle . This allows the vocal cords to come together in a gentle way and allows the air being exhaled from the lungs to do most/all of the work when you are speaking.  5. Avoid excessive throat clearing, coughing, and loud laughter . During these activities the vocal cords can also be slammed together in a hard way and foster irritation or swelling, which can cause hoarseness. . Try taking sips of room temperature/cool water with hard swallows to clear secretions from the throat, instead of throat clearing or  coughing. . If you absolutely must clear your throat, do so as gently as possible. If you find yourself clearing your throat or coughing a lot, consult your physician. . You will want to laugh. Continue to do so, but softly and gently. Do not laugh loudly for long periods of time because this can increase the chances for vocal fold irritation and thus hoarseness. . Lozenges can help reduce the need to clear throat/cough during cold/allergy season.  6. Keep the mouth and throat lubricated . Drink at least 8-10 8 oz. glasses of water per day (64-80 oz.). This water can come in the form of drink mixes.  . Caffeinated beverages such as colas, coffee, and tea (hot tea AND sweet tea) dry out the vocal cords and then can cause irritation and thus hoarseness.  . Drinking water will keep your body hydrated. This will help to decrease secretions in the throat, which cause people to clear their throats or cough. . If you are exercising outside (especially during drier weather), try to breathe through your nose as much as possible. If this is not possible, lifting the tongue up behind the upper teeth when breathing through the mouth will add some moisture to the air breathed in past the vocal cords.  7. Avoid mouth breathing - breathe through your nose . Your nose serves as a natural filter for dust and dirt particles from the air, and as a humidifier to   moisten the air. Vocal cords like to work in moistened, filtered air. When you breathe through your mouth you lose the air filtering and moistening benefits of breathing through the nose. . Be aware of breathing patterns when sitting quietly (e.g., reading or watching TV). Increase your awareness and try to change habits from mouth breathing to nose breathing during those times.  8. Avoid environmental and/or ingested irritants . Try to avoid smoke-filled and or dusty environments. These items dry out the vocal cords and cause irritation.  9. Use an air filter  if the home is dusty, and/or a humidifier if the air is dry  . This will help to maintain clean, humid air to breathe. Remember, this type of air is what the vocal cords like best.  
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
## 2019-01-29 ENCOUNTER — Telehealth: Payer: Self-pay | Admitting: Nurse Practitioner

## 2019-01-29 NOTE — Telephone Encounter (Signed)
Copied from [facility] [id number]. Topic: Quick Communication - Rx Refill/Question >> Jan 29, 2019  9:15 AM Reyne Dumas L wrote: Medication: Potassium  Has the patient contacted their pharmacy? Yes - pt has a new pharmacy and pharmacy is calling because they can't get this one transferred from [facility] because there are no refills left, last filled by Dr. Ina Homes (Agent: If no, request that the patient contact the pharmacy for the refill.) (Agent: If yes, when and what did the pharmacy advise?)  Preferred Pharmacy (with phone number or street name): [place], [place] - [place] [phone] (Phone) [phone] (Fax)  Agent: Please be advised that RX refills may take up to 3 business days. We ask that you follow-up with your pharmacy.

## 2019-01-29 NOTE — Telephone Encounter (Signed)
[place] please advise, we have not send in this med.

## 2019-01-29 NOTE — Telephone Encounter (Signed)
Never prescribed this, so do not refill. Why is she requesting this?

## 2019-01-30 ENCOUNTER — Ambulatory Visit: Payer: Medicare HMO

## 2019-01-30 NOTE — Telephone Encounter (Signed)
Hold potassium for now It is contraindicated to take potassium and maxzide

## 2019-01-30 NOTE — Telephone Encounter (Signed)
Pt stated Dr. Tarri Abernethy from [facility] gave it to her before she transfer to [facility] (he gave her enough to last until  Now) due to low K+ level. Please advise.

## 2019-01-30 NOTE — Telephone Encounter (Signed)
Pt is aware.  

## 2019-02-01 ENCOUNTER — Ambulatory Visit: Payer: Medicare HMO

## 2019-02-02 IMAGING — DX DG CHEST 2V
2 series · 2 of 2 positions shown · non-contrast
Comparison: 07/12/2018 and earlier.

CLINICAL DATA: 67-year-old female with fever chills and cough.

EXAM:
CHEST - 2 VIEW

[chest pa]
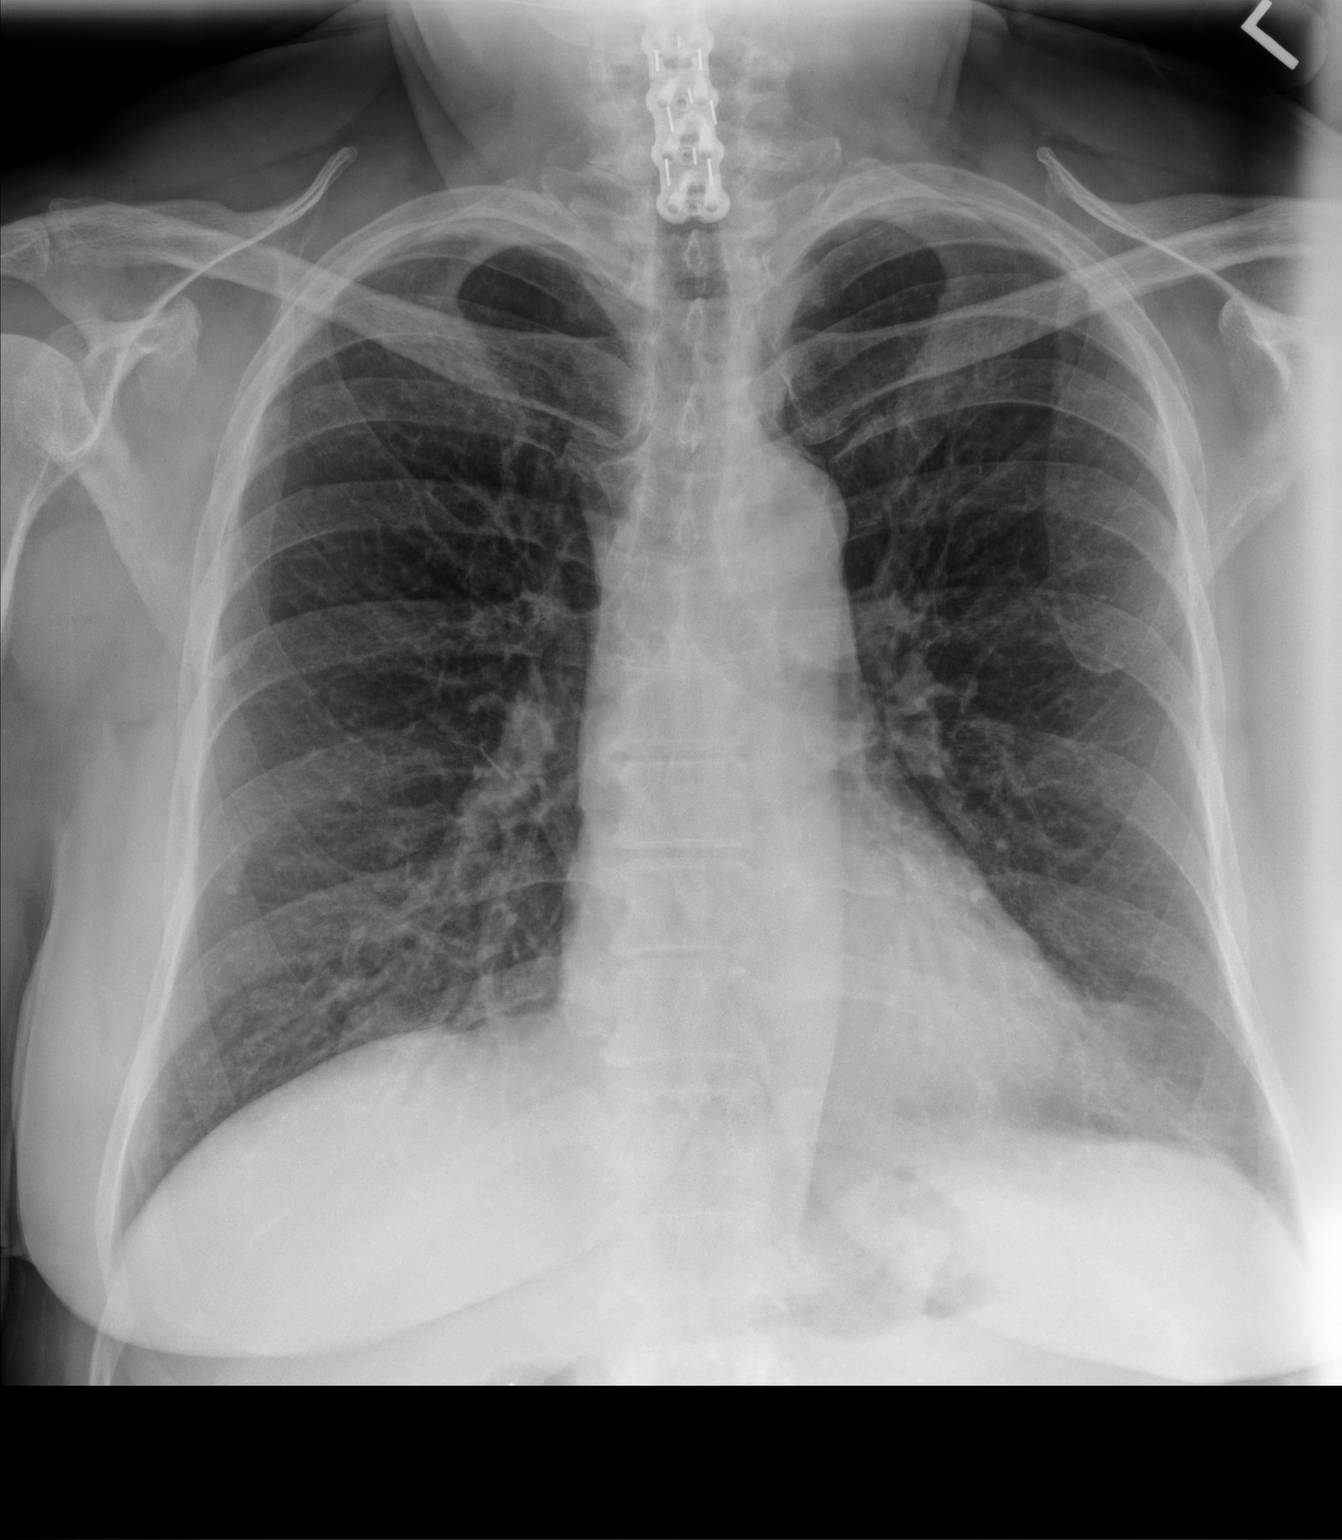

[chest lat]
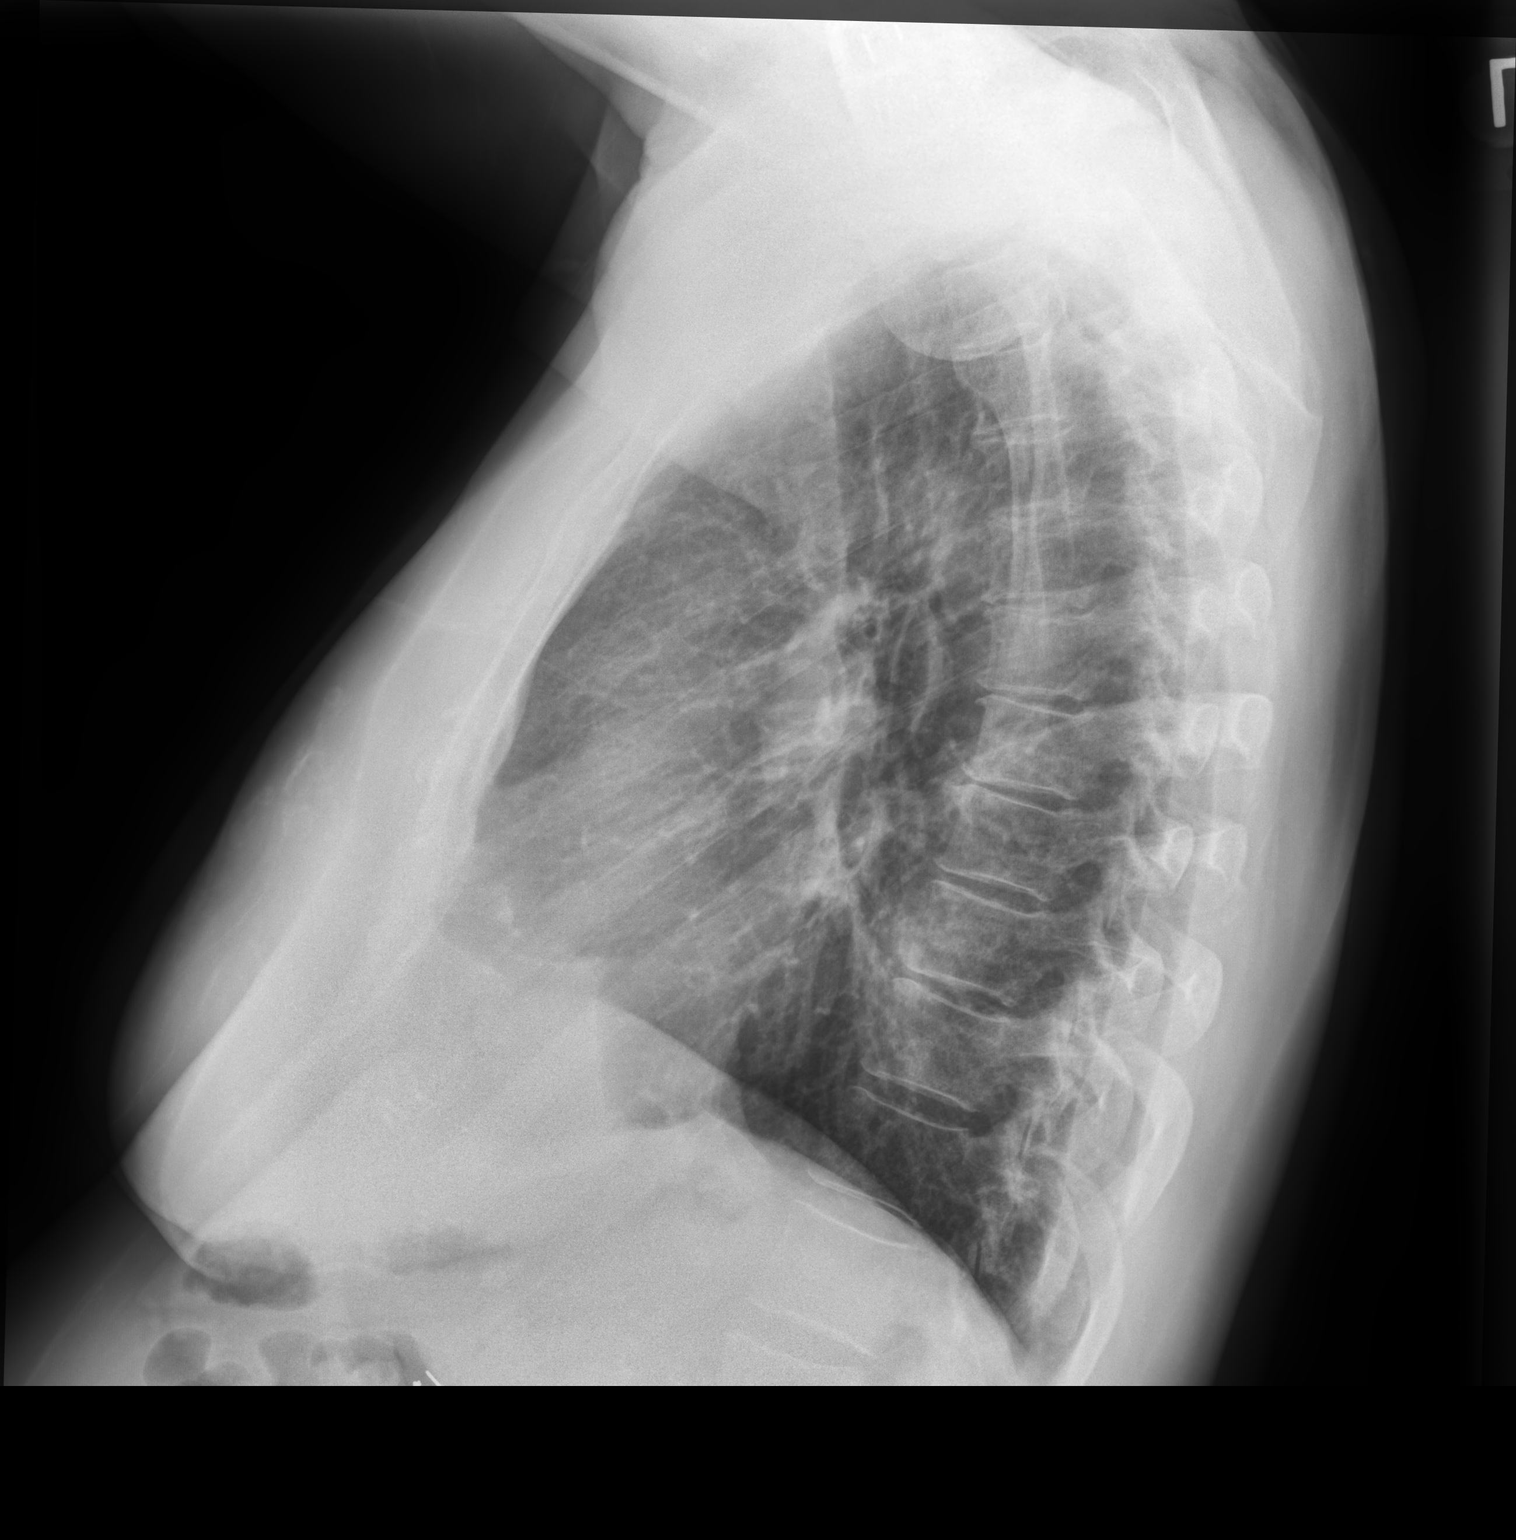

[2 of 2 positions shown; findings below may reference images not displayed]

FINDINGS: Lung volumes and mediastinal contours remain within normal limits.
Indistinct opacity at the left lung base is stable since 3479 and
appears related to epicardial fat. No pneumothorax, pulmonary edema,
pleural effusion or confluent pulmonary opacity. Stable
cholecystectomy clips. Cervical ACDF. No acute osseous abnormality
identified. Negative visible bowel gas pattern.
IMPRESSION: No acute cardiopulmonary abnormality.

## 2019-02-06 ENCOUNTER — Telehealth: Payer: Self-pay | Admitting: Neurology

## 2019-02-06 NOTE — Telephone Encounter (Signed)
Due to current COVID 19 pandemic, our office is severely reducing in office visits until further notice, in order to minimize the risk to our patients and healthcare providers.    Called patient to offer an in office visit for 6/2 appt since she had previously refused a virtual visit. Patient accepted an in office visit with some precautions. Patient understands that upon arrival she will have her temperature checked/will be screened for COVID. Patient is aware that only one person is allowed in office with her if necessary and will also have temp checked.

## 2019-02-08 ENCOUNTER — Other Ambulatory Visit: Payer: Self-pay | Admitting: Nurse Practitioner

## 2019-02-08 ENCOUNTER — Ambulatory Visit: Payer: Medicare HMO

## 2019-02-08 ENCOUNTER — Other Ambulatory Visit: Payer: Self-pay

## 2019-02-08 DIAGNOSIS — R490 Dysphonia: Secondary | ICD-10-CM | POA: Diagnosis not present

## 2019-02-08 DIAGNOSIS — F411 Generalized anxiety disorder: Secondary | ICD-10-CM

## 2019-02-08 DIAGNOSIS — R1310 Dysphagia, unspecified: Secondary | ICD-10-CM

## 2019-02-08 NOTE — Patient Instructions (Addendum)
-  Practice 10 abdominal breaths  -Straw phonation  2 minutes prior to PHoRTE and throughout the day   PHoRTE - twice a day --  You can search you tube for "phorte speech exercises part 1" 1.  10 Loud AH's as loud as you can and as long as you can       YouTube: Speech Therapy Practice - Sustained Phonation /ah/ 10 times  [email or web address]  2. 10 Pitch glides up and down on numbers 1-10 (omit 7) [email or web address]   3. 10 sentences in loud high pitch voice, like you are calling your neighbor over the fence   AND 4. 10 sentences in loud low authoritative pitch, like you are the boss [email or web address]   Use a good belly breath before each exercise- feel your abs contract in as you use your voice
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
## 2019-02-11 NOTE — Telephone Encounter (Signed)
I called pt. She is not using her cpap but does plan on coming for her appt tomorrow to discuss her sleep. Pt understands our COVID policies and procedures.

## 2019-02-12 ENCOUNTER — Ambulatory Visit
Admission: RE | Admit: 2019-02-12 | Discharge: 2019-02-12 | Disposition: A | Discharge status: Home / Self Care | Payer: Medicare HMO | Source: Ambulatory Visit | Attending: Nurse Practitioner | Admitting: Nurse Practitioner

## 2019-02-12 ENCOUNTER — Other Ambulatory Visit: Payer: Self-pay

## 2019-02-12 ENCOUNTER — Encounter: Payer: Self-pay | Admitting: Neurology

## 2019-02-12 ENCOUNTER — Ambulatory Visit: Payer: Medicare HMO

## 2019-02-12 ENCOUNTER — Ambulatory Visit (INDEPENDENT_AMBULATORY_CARE_PROVIDER_SITE_OTHER): Payer: Medicare HMO | Admitting: Neurology

## 2019-02-12 VITALS — BP 160/87 | HR 62 | Temp 98.0°F | Ht 63.0 in | Wt 177.0 lb

## 2019-02-12 DIAGNOSIS — G4730 Sleep apnea, unspecified: Secondary | ICD-10-CM

## 2019-02-12 DIAGNOSIS — M85851 Other specified disorders of bone density and structure, right thigh: Secondary | ICD-10-CM | POA: Diagnosis not present

## 2019-02-12 DIAGNOSIS — E2839 Other primary ovarian failure: Secondary | ICD-10-CM

## 2019-02-12 DIAGNOSIS — Z780 Asymptomatic menopausal state: Secondary | ICD-10-CM | POA: Diagnosis not present
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
## 2019-02-12 NOTE — Patient Instructions (Addendum)
You have not benefited from using the autoPAP. I appreciate you trying AutoPap. As discussed, we will discontinue AutoPap therapy at this time. I will see you back as needed. Please call your DME company to pick up the machine.   For your overall mild sleep apnea: Please try to continue with weight loss endeavors and try to sleep off your back.

## 2019-02-12 NOTE — Progress Notes (Signed)
Order for d/c auto pap sent to [facility] via community message. Confirmation received that the order transmitted was successful.

## 2019-02-15 ENCOUNTER — Other Ambulatory Visit: Payer: Self-pay

## 2019-02-15 ENCOUNTER — Ambulatory Visit: Payer: Medicare HMO

## 2019-02-15 DIAGNOSIS — R490 Dysphonia: Secondary | ICD-10-CM | POA: Insufficient documentation

## 2019-02-15 DIAGNOSIS — R1310 Dysphagia, unspecified: Secondary | ICD-10-CM | POA: Insufficient documentation

## 2019-02-15 NOTE — Therapy (Signed)
[facility] [address] [place], [place], [address] Phone: [phone]   Fax:  [phone]  Patient Details  Name: [name] MRN: [id number] Date of Birth: 1951-06-26 Referring Provider:  Lind Guest, MD  Encounter Date: 02/15/2019  ST Arrived-Cancel  Telehealth was attempted with pt today however she did not have a working email account to send the link to join [employer]. She will come into the clinic for her next appointment (Tuesday 02-19-19) and front office staff will work with her to ascertain whether or not telehealth is possible with her available technology.  If not, pt may wish to cont with in-person visits, or if uncomfortable with additional in-person visits she may choose to be d/c'd.  [facility] ,[place], [place]  02/15/2019, 2:45 PM  [facility] [address] [place], [place], [address] Phone: [phone]   Fax:  [phone]

## 2019-02-19 ENCOUNTER — Ambulatory Visit: Payer: Medicare HMO | Attending: Otolaryngology | Admitting: Speech Pathology

## 2019-02-22 ENCOUNTER — Other Ambulatory Visit: Payer: Self-pay

## 2019-02-22 ENCOUNTER — Ambulatory Visit: Payer: Medicare HMO

## 2019-02-22 DIAGNOSIS — R1310 Dysphagia, unspecified: Secondary | ICD-10-CM

## 2019-02-22 DIAGNOSIS — R490 Dysphonia: Secondary | ICD-10-CM | POA: Diagnosis not present
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
## 2019-02-25 ENCOUNTER — Ambulatory Visit: Payer: Self-pay | Admitting: Pulmonary Disease

## 2019-02-26 ENCOUNTER — Encounter: Payer: Self-pay | Admitting: Nurse Practitioner

## 2019-02-26 ENCOUNTER — Other Ambulatory Visit: Payer: Self-pay

## 2019-02-26 ENCOUNTER — Ambulatory Visit (INDEPENDENT_AMBULATORY_CARE_PROVIDER_SITE_OTHER): Payer: Medicare HMO | Admitting: Nurse Practitioner

## 2019-02-26 ENCOUNTER — Ambulatory Visit: Payer: Medicare HMO

## 2019-02-26 VITALS — BP 136/78 | HR 66 | Temp 98.4°F | Ht 63.0 in | Wt 178.2 lb

## 2019-02-26 DIAGNOSIS — F410 Panic disorder [episodic paroxysmal anxiety] without agoraphobia: Secondary | ICD-10-CM | POA: Diagnosis not present

## 2019-02-26 DIAGNOSIS — R202 Paresthesia of skin: Secondary | ICD-10-CM | POA: Diagnosis not present

## 2019-02-26 DIAGNOSIS — M79672 Pain in left foot: Secondary | ICD-10-CM | POA: Diagnosis not present

## 2019-02-26 DIAGNOSIS — R5383 Other fatigue: Secondary | ICD-10-CM | POA: Diagnosis not present

## 2019-02-26 DIAGNOSIS — I10 Essential (primary) hypertension: Secondary | ICD-10-CM

## 2019-02-26 DIAGNOSIS — R7303 Prediabetes: Secondary | ICD-10-CM | POA: Diagnosis not present

## 2019-02-26 DIAGNOSIS — G4733 Obstructive sleep apnea (adult) (pediatric): Secondary | ICD-10-CM

## 2019-02-26 DIAGNOSIS — R490 Dysphonia: Secondary | ICD-10-CM | POA: Diagnosis not present

## 2019-02-26 DIAGNOSIS — R739 Hyperglycemia, unspecified: Secondary | ICD-10-CM | POA: Diagnosis not present

## 2019-02-26 DIAGNOSIS — E538 Deficiency of other specified B group vitamins: Secondary | ICD-10-CM | POA: Diagnosis not present

## 2019-02-26 DIAGNOSIS — F411 Generalized anxiety disorder: Secondary | ICD-10-CM | POA: Diagnosis not present

## 2019-02-26 DIAGNOSIS — R1310 Dysphagia, unspecified: Secondary | ICD-10-CM

## 2019-02-26 DIAGNOSIS — R8290 Unspecified abnormal findings in urine: Secondary | ICD-10-CM

## 2019-02-26 DIAGNOSIS — G4730 Sleep apnea, unspecified: Secondary | ICD-10-CM | POA: Insufficient documentation

## 2019-02-26 NOTE — Patient Instructions (Signed)
Do the exercises TWICE each DAY. Look at the paper for what to do. Bring the swallowing sheet next time.

## 2019-02-26 NOTE — Assessment & Plan Note (Signed)
Improved with amlodipine, losartan, metoprolol, and maxzide. BP Readings from Last 3 Encounters:  02/26/19 136/78  02/12/19 (!) 160/87  01/23/19 (!) 160/62   Repeat BMP with complain of increased fatigue

## 2019-02-26 NOTE — Assessment & Plan Note (Addendum)
Diagnosed with mild sleep apnea, but has not been able to tolerate autoPAP mask, last seen by neurology 02/12/2019. She does not wish to continue use.   Continues to complain of daytime somnolence. worsening in last 1week. maxzide added for HTN in last 48month. No other medication change made in last 43months

## 2019-02-26 NOTE — Progress Notes (Signed)
Subjective:  Patient ID: [name], female    DOB: 21-Oct-1950  Age: 68 y.o. MRN: [id number]  CC: Follow-up (follow up on HTN and anxiety---lab consult? too tired more often--return to work consult? left leg pain--felt like glass inside heel area?)  Foot Injury  The incident occurred more than 1 week ago. There was no injury mechanism. The pain is present in the left heel. The quality of the pain is described as aching and shooting. The pain has been intermittent since onset. Associated symptoms include an inability to bear weight. Pertinent negatives include no loss of motion, loss of sensation, muscle weakness, numbness or tingling. She reports no foreign bodies present. The symptoms are aggravated by movement, palpation and weight bearing. She has tried nothing for the symptoms.  left heel pain worse in AM, prolonged walking and standing.  HTN: Controlled with amlodipine, losartan, metoprolol, and maxzide. BP Readings from Last 3 Encounters:  02/26/19 136/78  02/12/19 (!) 160/87  01/23/19 (!) 160/62   Today she reports worsening daytime somnolence and fatigue, onset 64month ago and worsening in last 1week. reports falling asleep while watching TV or reading a book or riding in car as passenger. She was diagnosed with mild sleep apnea, but has not been able to tolerate autoPAP mask, last seen by neurology 02/12/2019. She does not wish to continue use.  Anxiety and depression: Uses Alprazolam 0.5mg  once a day at HS. Denies any increased anxiety or depression. Depression screen [facility] 2/9 01/09/2019 01/02/2018 12/11/2017  Decreased Interest 0 0 0  Down, Depressed, Hopeless 0 0 0  PHQ - 2 Score 0 0 0  Altered sleeping - - -  Tired, decreased energy - - -  Change in appetite - - -  Feeling bad or failure about yourself  - - -  Trouble concentrating - - -  Moving slowly or fidgety/restless - - -  Suicidal thoughts - - -  PHQ-9 Score - - -  Difficult doing work/chores - - -  Some recent  data might be hidden   Reviewed past Medical, Social and Family history today.  Outpatient Medications Prior to Visit  Medication Sig Dispense Refill  . acetaminophen (TYLENOL) 500 MG tablet Take 500-1,000 mg by mouth every 6 (six) hours as needed for moderate pain.     Marland Kitchen ALPRAZolam (XANAX) 1 MG tablet TAKE 1/2 TABLET BY MOUTH EVERY 8 HOURS AS NEEDED FOR ANXIETY 45 tablet 1  . amLODipine (NORVASC) 5 MG tablet Take 1 tablet (5 mg total) by mouth at bedtime. 90 tablet 3  . aspirin EC 81 MG tablet Take 81 mg by mouth daily.    . Calcium Carbonate-Vitamin D (CALCIUM-CARB 600 + D) 600-125 MG-UNIT TABS Take 1 tablet by mouth daily.     . cetirizine (ZYRTEC) 10 MG tablet Take 1 tablet (10 mg total) by mouth at bedtime. 30 tablet 1  . fluticasone (FLOVENT HFA) 110 MCG/ACT inhaler Inhale 2 puffs into the lungs 2 (two) times daily. 1 Inhaler 6  . losartan (COZAAR) 100 MG tablet Take 1 tablet (100 mg total) by mouth daily. 90 tablet 3  . metoprolol tartrate (LOPRESSOR) 100 MG tablet Take 1 tablet (100 mg total) by mouth 2 (two) times daily. 180 tablet 3  . pantoprazole (PROTONIX) 40 MG tablet TAKE 1 TABLET (40 MG TOTAL) BY MOUTH DAILY. 90 tablet 0  . pravastatin (PRAVACHOL) 40 MG tablet TAKE 1 TABLET BY MOUTH IN THE EVENING 90 tablet 3  . RESTASIS 0.05 % ophthalmic  emulsion Place 1 drop into both eyes 2 (two) times daily.  3  . triamterene-hydrochlorothiazide (MAXZIDE-25) 37.5-25 MG tablet Take 1 tablet by mouth daily. 30 tablet 2  . VENTOLIN HFA 108 (90 Base) MCG/ACT inhaler Inhale 2 puffs into the lungs every 6 (six) hours as needed for wheezing or shortness of breath. 1 Inhaler 3  . vitamin B-12 (CYANOCOBALAMIN) 1000 MCG tablet Take 1 tablet (1,000 mcg total) by mouth daily. 30 tablet 1   No facility-administered medications prior to visit.     ROS Review of Systems  Constitutional: Positive for malaise/fatigue. Negative for chills and fever.  Respiratory: Negative.   Cardiovascular: Negative.    Gastrointestinal: Negative.   Genitourinary: Negative.   Musculoskeletal: Negative.   Neurological: Negative for tingling and numbness.  Endo/Heme/Allergies: Negative for polydipsia.  Psychiatric/Behavioral: Negative for depression. The patient is not nervous/anxious and does not have insomnia.    Objective:  BP 136/78   Pulse 66   Temp 98.4 F (36.9 C) (Oral)   Ht 5\' 3"  (1.6 m)   Wt 178 lb 3.2 oz (80.8 kg)   SpO2 96%   BMI 31.57 kg/m   BP Readings from Last 3 Encounters:  02/26/19 136/78  02/12/19 (!) 160/87  01/23/19 (!) 160/62    Wt Readings from Last 3 Encounters:  02/26/19 178 lb 3.2 oz (80.8 kg)  02/12/19 177 lb (80.3 kg)  01/23/19 178 lb (80.7 kg)   Physical Exam Cardiovascular:     Pulses:          Dorsalis pedis pulses are 2+ on the right side and 2+ on the left side.  Musculoskeletal:     Right lower leg: No edema.     Left lower leg: No edema.     Right foot: Normal range of motion. No deformity or prominent metatarsal heads.     Left foot: Normal range of motion. No deformity or prominent metatarsal heads.       Feet:  Feet:     Right foot:     Skin integrity: Skin integrity normal.     Toenail Condition: Right toenails are normal.     Left foot:     Skin integrity: Skin integrity normal.     Toenail Condition: Left toenails are normal.  Skin:    Findings: No erythema.     Lab Results  Component Value Date   WBC 4.0 10/17/2018   HGB 13.3 10/17/2018   HCT 39.6 10/17/2018   PLT 188.0 10/17/2018   GLUCOSE 104 (H) 02/26/2019   CHOL 141 07/13/2018   TRIG 96 07/13/2018   HDL 38 (L) 07/13/2018   LDLCALC 84 07/13/2018   ALT 9 06/12/2018   AST 13 06/12/2018   NA 142 02/26/2019   K 4.3 02/26/2019   CL 104 02/26/2019   CREATININE 0.89 02/26/2019   BUN 14 02/26/2019   CO2 28 02/26/2019   TSH 1.31 02/26/2019   INR 1.02 08/17/2013   HGBA1C 6.1 (H) 02/26/2019    Dg Bone Density  Result Date: 02/12/2019 EXAM: DUAL X-RAY ABSORPTIOMETRY (DXA)  FOR BONE MINERAL DENSITY IMPRESSION: Referring Physician:  Katy Your patient completed a BMD test using Lunar IDXA DXA system ( analysis version: 16 ) manufactured by [employer]. Technologist: AW PATIENT: Name: Linda, [name] Patient ID: [id number] Birth Date: 09-06-1951 Height: 62.3 in. Sex: Female Measured: 02/12/2019 Weight: 176.6 lbs. Indications: Caucasian, Estrogen Deficient, Family Hist. (Parent hip fracture), Height Loss (781.91), Pantoprazole, Postmenopausal Fractures: None Treatments: Calcium (E943.0),  Vitamin D (E933.5) ASSESSMENT: The BMD measured at Femur Neck Right is 0.889 g/cm2 with a T-score of -1.1. This patient is considered OSTEOPENIC according to [facility]) criteria. The scan quality is good. L-3 and L-4 were excluded due to degenerative changes. Site Region Measured Date Measured Age YA T-score BMD Significant CHANGE DualFemur Neck Right 02/12/2019    65.8         -1.1    0.889 g/cm2 [facility] Spine  L1-L2      02/12/2019    65.8         -0.2    1.143 g/cm2 DualFemur Total Mean 02/12/2019    65.8         0.3     1.049 g/cm2 World Health Organization [facility]) criteria for post-menopausal, Caucasian Women: Normal       T-score at or above -1 SD Osteopenia   T-score between -1 and -2.5 SD Osteoporosis T-score at or below -2.5 SD RECOMMENDATION: 1. All patients should optimize calcium and vitamin D intake. 2. Consider FDA approved medical therapies in postmenopausal women and men aged 47 years and older, based on the following: a. A hip or vertebral (clinical or morphometric) fracture b. T- score < or = -2.5 at the femoral neck or spine after appropriate evaluation to exclude secondary causes c. Low bone mass (T-score between -1.0 and -2.5 at the femoral neck or spine) and a 10 year probability of a hip fracture > or = 3% or a 10 year probability of a major osteoporosis-related fracture > or = 20% based on the [place]-adapted WHO algorithm d. Clinician judgment and/or patient  preferences may indicate treatment for people with 10-year fracture probabilities above or below these levels FOLLOW-UP: People with diagnosed cases of osteoporosis or at high risk for fracture should have regular bone mineral density tests. For patients eligible for Medicare, routine testing is allowed once every 2 years. The testing frequency can be increased to one year for patients who have rapidly progressing disease, those who are receiving or discontinuing medical therapy to restore bone mass, or have additional risk factors. I have reviewed this report and agree with the above findings. Mark A. Thornton Papas, M.D. [facility] FRAX* 10-year Probability of Fracture Based on femoral neck BMD: DualFemur (Right) Major Osteoporotic Fracture: 14.9% Hip Fracture:                0.7% Population:                  [place] (Caucasian) Risk Factors:                Family Hist. (Parent hip fracture) *FRAX is a [place] [employer] for Metabolic Bone Disease, a [employer] (WHO) [facility]. ASSESSMENT: The probability of a major osteoporotic fracture is 14.9 % within the next ten years. The probability of a hip fracture is 0.7 % within the next ten years. I have reviewed this report and agree with the above findings. Mark A. Thornton Papas, M.D. [facility] Electronically Signed   By: Lavonia Dana M.D.   On: 02/12/2019 09:58    Assessment & Plan:   [place] was seen today for follow-up.  Diagnoses and all orders for this visit:  Fatigue, unspecified type -     Basic metabolic panel -     TSH  Essential hypertension  Hyperglycemia -     Hemoglobin A1c -     B12  Pain of  left heel -     Ambulatory referral to Podiatry  B12 deficiency -     B12  Generalized anxiety disorder with panic attacks  Obstructive sleep apnea syndrome  Abnormal finding on urinalysis -     Urinalysis w microscopic + reflex cultur -     REFLEXIVE URINE CULTURE -      Urine Culture   I am having Mitchel Honour maintain her Calcium Carbonate-Vitamin D, aspirin EC, acetaminophen, Restasis, vitamin B-12, cetirizine, losartan, metoprolol tartrate, pravastatin, fluticasone, Ventolin HFA, amLODipine, pantoprazole, triamterene-hydrochlorothiazide, and ALPRAZolam.  No orders of the defined types were placed in this encounter.   Problem List Items Addressed This Visit      Cardiovascular and Mediastinum   Essential hypertension (Chronic)    Improved with amlodipine, losartan, metoprolol, and maxzide. BP Readings from Last 3 Encounters:  02/26/19 136/78  02/12/19 (!) 160/87  01/23/19 (!) 160/62   Repeat BMP with complain of increased fatigue        Respiratory   Sleep apnea    Diagnosed with mild sleep apnea, but has not been able to tolerate autoPAP mask, last seen by neurology 02/12/2019. She does not wish to continue use.   Continues to complain of daytime somnolence. worsening in last 1week. maxzide added for HTN in last 51month. No other medication change made in last 37months          Other   Generalized anxiety disorder with panic attacks (Chronic)    Stable Continues to take 0.5mg  of alprazolam at HS       Other Visit Diagnoses    Fatigue, unspecified type    -  Primary   Relevant Orders   Basic metabolic panel (Completed)   TSH (Completed)   Hyperglycemia       Relevant Orders   Hemoglobin A1c (Completed)   B12 (Completed)   Pain of left heel       Relevant Orders   Ambulatory referral to Podiatry   B12 deficiency       Relevant Orders   B12 (Completed)   Abnormal finding on urinalysis       Relevant Orders   Urinalysis w microscopic + reflex cultur (Completed)   REFLEXIVE URINE CULTURE (Completed)   Urine Culture      Follow-up: Return if symptoms worsen or fail to improve.  Wilfred Lacy, NP
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
## 2019-02-26 NOTE — Patient Instructions (Addendum)
Abnormal UA, pending urine culture Stable HgbA1c at 6.1 (prediabetes) Normal thyroid, renal function and electrolytes. Elevated B12, you do not need any extra B12 supplement. I do not have an explanation to daytime somnolence. I recommend f/up another appt with neurologist  You will be contacted to schedule appt with podiatry  Use an iced bottle to massage left foot every morning.   Heel Spur  A heel spur is a bony growth that forms on the bottom of the heel bone (calcaneus). Heel spurs are common. They often cause inflammation in the band of tissue that connects the toes to the heel bone (plantar fascia). This may cause pain on the bottom of the foot, near the heel. Many people with plantar fasciitis also have heel spurs. However, spurs are not the cause of plantar fasciitis pain. What are the causes? The exact cause of heel spurs is not known. They may be caused by:  Pressure on the heel bone.  Bands of tissue (tendons) pulling on the heel bone. What increases the risk? You are more likely to develop this condition if you:  Are older than 40.  Are overweight.  Have wear-and-tear arthritis (osteoarthritis).  Have plantar fascia inflammation.  Participate in sports or activities that include a lot of running or jumping.  Wear poorly fitted shoes. What are the signs or symptoms? Some people have no symptoms. If you do have symptoms, they may include:  Pain in the bottom of your heel.  Pain that is worse when you first get out of bed.  Pain that gets worse after walking or standing. How is this diagnosed? This condition may be diagnosed based on:  Your symptoms and medical history.  A physical exam.  A foot X-ray. How is this treated? Treatment for this condition depends on how much pain you have. Treatment options may include:  Doing stretching exercises.  Losing weight, if necessary.  Wearing specific shoes or inserts inside of shoes (orthotics) for comfort and  support.  Wearing splints on your feet while you sleep. Splints keep your feet in a position (usually 90 degrees) that should prevent and relieve the pain you feel when you first get out of bed. They also make stretching easier in the morning.  Taking over-the-counter medicine to relieve pain, such as NSAIDs.  Using high-intensity sound waves to break up the heel spur (extracorporeal shock wave therapy).  Getting steroid injections in your heel to reduce inflammation.  Having surgery, if your heel spur causes long-term (chronic) pain. Follow these instructions at home:  Activity  Avoid activities that cause pain until you recover, or for as long as directed by your health care provider.  Do stretching exercises as directed. Stretch before exercising or being physically active. Managing pain, stiffness, and swelling  If directed, put ice on your foot: ? Put ice in a plastic bag. ? Place a towel between your skin and the bag. ? Leave the ice on for 20 minutes, 2-3 times a day.  Move your toes often to avoid stiffness and to lessen swelling.  When possible, raise (elevate) your foot above the level of your heart while you are sitting or lying down. General instructions  Take over-the-counter and prescription medicines only as told by your health care provider.  Wear supportive shoes that fit well. Wear splints, inserts, or orthotics as told by your health care provider.  If recommended, work with your health care provider to lose weight. This can relieve pressure on your foot.  Do  not use any products that contain nicotine or tobacco, such as cigarettes and e-cigarettes. These can affect bone growth and healing. If you need help quitting, ask your health care provider.  Keep all follow-up visits as told by your health care provider. This is important. Contact a health care provider if:  Your pain does not go away with treatment.  Your pain gets worse. Summary  A heel spur  is a bony growth that forms on the bottom of the heel bone (calcaneus).  Heel spurs often cause inflammation in the band of tissue that connects the toes to the heel bone (plantar fascia). This may cause pain on the bottom of the foot, near the heel.  Doing stretching exercises, losing weight, wearing specific shoes or shoe inserts, wearing splints while you sleep, and taking pain medicine may ease the pain and stiffness.  Other treatment options may include high-intensity sound waves to break up the heel spur, steroid injections, or surgery. This information is not intended to replace advice given to you by your health care provider. Make sure you discuss any questions you have with your health care provider. Document Released: 10/05/2005 Document Revised: 08/16/2017 Document Reviewed: 08/16/2017 Elsevier Interactive Patient Education  2019 [employer].

## 2019-02-26 NOTE — Assessment & Plan Note (Signed)
Stable Continues to take 0.5mg  of alprazolam at HS

## 2019-02-27 ENCOUNTER — Encounter: Payer: Self-pay | Admitting: Nurse Practitioner

## 2019-02-27 LAB — BASIC METABOLIC PANEL
BUN: 14 mg/dL (ref 7–25)
CO2: 28 mmol/L (ref 20–32)
Calcium: 10.4 mg/dL (ref 8.6–10.4)
Chloride: 104 mmol/L (ref 98–110)
Creat: 0.89 mg/dL (ref 0.50–0.99)
Glucose, Bld: 104 mg/dL — ABNORMAL HIGH (ref 65–99)
Potassium: 4.3 mmol/L (ref 3.5–5.3)
Sodium: 142 mmol/L (ref 135–146)

## 2019-02-27 LAB — HEMOGLOBIN A1C
Hgb A1c MFr Bld: 6.1 % of total Hgb — ABNORMAL HIGH (ref <5.7)
Mean Plasma Glucose: 128 (calc)
eAG (mmol/L): 7.1 (calc)

## 2019-02-27 LAB — VITAMIN B12: Vitamin B-12: 1292 pg/mL — ABNORMAL HIGH (ref 200–1100)

## 2019-02-27 LAB — TSH: TSH: 1.31 mIU/L (ref 0.40–4.50)

## 2019-02-28 LAB — URINE CULTURE
MICRO NUMBER:: 578913
SPECIMEN QUALITY:: ADEQUATE

## 2019-02-28 LAB — URINALYSIS W MICROSCOPIC + REFLEX CULTURE
Bacteria, UA: NONE SEEN /HPF
Bilirubin Urine: NEGATIVE
Glucose, UA: NEGATIVE
Hgb urine dipstick: NEGATIVE
Hyaline Cast: NONE SEEN /LPF
Ketones, ur: NEGATIVE
Nitrites, Initial: NEGATIVE
Protein, ur: NEGATIVE
RBC / HPF: NONE SEEN /HPF (ref 0–2)
Specific Gravity, Urine: 1.008 (ref 1.001–1.03)
WBC, UA: NONE SEEN /HPF (ref 0–5)
pH: 6.5 (ref 5.0–8.0)

## 2019-02-28 LAB — CULTURE INDICATED

## 2019-03-01 ENCOUNTER — Ambulatory Visit: Payer: Medicare HMO

## 2019-03-01 ENCOUNTER — Other Ambulatory Visit: Payer: Self-pay

## 2019-03-01 DIAGNOSIS — R1310 Dysphagia, unspecified: Secondary | ICD-10-CM | POA: Diagnosis not present

## 2019-03-01 DIAGNOSIS — R490 Dysphonia: Secondary | ICD-10-CM
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
## 2019-03-04 ENCOUNTER — Other Ambulatory Visit: Payer: Self-pay | Admitting: Otolaryngology

## 2019-03-04 ENCOUNTER — Other Ambulatory Visit: Payer: Self-pay

## 2019-03-04 ENCOUNTER — Telehealth: Payer: Self-pay | Admitting: *Deleted

## 2019-03-04 ENCOUNTER — Ambulatory Visit (INDEPENDENT_AMBULATORY_CARE_PROVIDER_SITE_OTHER): Payer: Medicare HMO | Admitting: Podiatry

## 2019-03-04 ENCOUNTER — Other Ambulatory Visit: Payer: Self-pay | Admitting: Podiatry

## 2019-03-04 ENCOUNTER — Ambulatory Visit (INDEPENDENT_AMBULATORY_CARE_PROVIDER_SITE_OTHER): Payer: Medicare HMO

## 2019-03-04 VITALS — Temp 98.2°F

## 2019-03-04 DIAGNOSIS — R609 Edema, unspecified: Secondary | ICD-10-CM

## 2019-03-04 DIAGNOSIS — G8929 Other chronic pain: Secondary | ICD-10-CM | POA: Diagnosis not present

## 2019-03-04 DIAGNOSIS — R1312 Dysphagia, oropharyngeal phase: Secondary | ICD-10-CM

## 2019-03-04 DIAGNOSIS — M79672 Pain in left foot: Secondary | ICD-10-CM

## 2019-03-04 DIAGNOSIS — M722 Plantar fascial fibromatosis: Secondary | ICD-10-CM | POA: Diagnosis not present

## 2019-03-04 NOTE — Telephone Encounter (Signed)
[facility] - MEDICAID NOTIFICATION STATES, "THIS [place] MEDICAID MEMBER DOES NOT REQUIRE PRIOR AUTHORIZATION FOR OUTPATIENT RADIOLOGY THROUGH [facility] OR [facility] AT THIS TIME." 

## 2019-03-04 NOTE — Telephone Encounter (Signed)
Faxed orders to [facility]. 

## 2019-03-04 NOTE — Patient Instructions (Signed)

## 2019-03-04 NOTE — Progress Notes (Signed)
Discussed with SLP Almyra Deforest. Will order FEES for dysphagia post-ACDF.    Helayne Seminole, MD

## 2019-03-05 ENCOUNTER — Other Ambulatory Visit: Payer: Self-pay

## 2019-03-05 ENCOUNTER — Encounter (HOSPITAL_COMMUNITY): Payer: Self-pay

## 2019-03-05 ENCOUNTER — Telehealth: Payer: Self-pay | Admitting: *Deleted

## 2019-03-05 ENCOUNTER — Ambulatory Visit: Payer: Medicare HMO

## 2019-03-05 ENCOUNTER — Ambulatory Visit (HOSPITAL_COMMUNITY)
Admission: RE | Admit: 2019-03-05 | Discharge: 2019-03-05 | Disposition: A | Discharge status: Home / Self Care | Payer: Medicare HMO | Source: Ambulatory Visit | Attending: Cardiology | Admitting: Cardiology

## 2019-03-05 DIAGNOSIS — R1310 Dysphagia, unspecified: Secondary | ICD-10-CM

## 2019-03-05 DIAGNOSIS — M79672 Pain in left foot: Secondary | ICD-10-CM | POA: Diagnosis not present

## 2019-03-05 DIAGNOSIS — R490 Dysphonia: Secondary | ICD-10-CM | POA: Diagnosis not present

## 2019-03-05 DIAGNOSIS — R609 Edema, unspecified: Secondary | ICD-10-CM | POA: Diagnosis not present
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
## 2019-03-05 NOTE — Telephone Encounter (Signed)
Pt called for results and I informed of Dr. Leigh Aurora review of results.

## 2019-03-05 NOTE — Telephone Encounter (Signed)
Left message for pt to call for results  

## 2019-03-05 NOTE — Progress Notes (Unsigned)
Left lower venous has been completed and is negative for DVT. Preliminary results can be found under CV proc through chart review.  Tariah Transue RVT [facility]  
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
## 2019-03-05 NOTE — Telephone Encounter (Signed)
-----   Message from Trula Slade, DPM sent at 03/05/2019  1:54 PM EDT ----- Val- please let her know that the ultrasound was negative for blood clot.

## 2019-03-08 ENCOUNTER — Ambulatory Visit: Payer: Medicare HMO | Admitting: Speech Pathology

## 2019-03-12 ENCOUNTER — Ambulatory Visit: Payer: Medicare HMO | Admitting: Speech Pathology

## 2019-03-12 ENCOUNTER — Other Ambulatory Visit: Payer: Self-pay

## 2019-03-12 DIAGNOSIS — R1310 Dysphagia, unspecified: Secondary | ICD-10-CM

## 2019-03-12 DIAGNOSIS — R490 Dysphonia: Secondary | ICD-10-CM
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
## 2019-03-12 NOTE — Patient Instructions (Addendum)
VOCAL HYGIENE  1. Avoid overuse of voice or excessive use of the voice . The vocal cords can become easily fatigued. Try to sort out what is "necessary" versus "unnecessary" talking in your environment. You do not need to STOP talking, but try to limit it as much as possible.  . Think of resting your voice just as long as you talk. For example, if you talk for 5 minutes, rest your voice completely for 5 minutes. . If your voice feels "tired" during the day, try to rest it as much as possible.  2. Avoid using an excessively loud voice or shouting/raising your voice . When you yell or raise your voice, the vocal cords slam into each other, much like a strong hand clap. This causes irritation, and if this irritation continues, hoarseness may increase. . If people in your home talk loudly, ask them to reduce the volume of their voices to help you decrease your volume as well. . Sit near or face the person to whom you are speaking.   3. Avoid talking over background noise . When talking in background noise, speech automatically has increased loudness, and as in number 2 above, continued loud speech can result in increased hoarseness due to irritation to the vocal cords.  . Do not talk over the radio or the TV. Mute them before speaking, go to a quieter place to talk, or sit next to the person with whom you are watching.  4. Talk in a voice that is soft, smooth, and gentle . This allows the vocal cords to come together in a gentle way and allows the air being exhaled from the lungs to do most/all of the work when you are speaking.  5. Avoid excessive throat clearing, coughing, and loud laughter . During these activities the vocal cords can also be slammed together in a hard way and foster irritation or swelling, which can cause hoarseness. . Try taking sips of room temperature/cool water with hard swallows to clear secretions from the throat, instead of throat clearing or coughing. . If you  absolutely must clear your throat, do so as gently as possible. If you find yourself clearing your throat or coughing a lot, consult your physician. . You will want to laugh. Continue to do so, but softly and gently. Do not laugh loudly for long periods of time because this can increase the chances for vocal fold irritation and thus hoarseness. . Lozenges can help reduce the need to clear throat/cough during cold/allergy season.  6. Keep the mouth and throat lubricated . Drink at least 8-10 8 oz. glasses of water per day (64-80 oz.). This water can come in the form of drink mixes.  . Caffeinated beverages such as colas, coffee, and tea (hot tea AND sweet tea) dry out the vocal cords and then can cause irritation and thus hoarseness.  . Drinking water will keep your body hydrated. This will help to decrease secretions in the throat, which cause people to clear their throats or cough. . If you are exercising outside (especially during drier weather), try to breathe through your nose as much as possible. If this is not possible, lifting the tongue up behind the upper teeth when breathing through the mouth will add some moisture to the air breathed in past the vocal cords.  7. Avoid mouth breathing - breathe through your nose . Your nose serves as a natural filter for dust and dirt particles from the air, and as a  humidifier to moisten the air. Vocal cords like to work in moistened, filtered air. When you breathe through your mouth you lose the air filtering and moistening benefits of breathing through the nose. Marland Kitchen Be aware of breathing patterns when sitting quietly (e.g., reading or watching TV). Increase your awareness and try to change habits from mouth breathing to nose breathing during those times.  8. Avoid environmental and/or ingested irritants . Try to avoid smoke-filled and or dusty environments. These items dry out the vocal cords and cause irritation.  9. Use an air filter if the home is  dusty, and/or a humidifier if the air is dry  . This will help to maintain clean, humid air to breathe. Remember, this type of air is what the vocal cords like best.      -Practice 10 abdominal breaths. Try it lying down first if you are having trouble coordinating it (Belly OUT when you breathe in)  -Straw phonation  2 minutes prior to [place] and throughout the day. Remember, try not to breathe in through the straw. Take a break, nice deep abdominal breath in, and then keep going.   PHoRTE - twice a day --  You can search you tube for "phorte speech exercises part 1" These exercises should not make you hoarse if you are doing them correctly. If you find that your voice is growling, gravelly, or hoarse, STOP and go back to just abdominal breathing and straw exercises.   1.  10 Loud AH's as loud as you can and as long as you can       YouTube: Speech Therapy Practice - Sustained Phonation /ah/ 10 times  [email or web address].[email or web address]  2. 10 Pitch glides up and down on numbers 1-10 (omit 7) : Check your text messages for the link to these exercises. [email or web address]   3. 10 sentences in loud high pitch voice, like you are calling your neighbor over the fence                       AND 4. 10 sentences in loud low authoritative pitch, like you are the boss [email or web address]   Use a good belly breath before each exercise- feel your abs contract in as you use your voice

## 2019-03-19 ENCOUNTER — Other Ambulatory Visit: Payer: Self-pay

## 2019-03-19 ENCOUNTER — Ambulatory Visit: Payer: Medicare HMO | Attending: Otolaryngology

## 2019-03-19 DIAGNOSIS — R490 Dysphonia: Secondary | ICD-10-CM | POA: Diagnosis not present

## 2019-03-19 DIAGNOSIS — R1310 Dysphagia, unspecified: Secondary | ICD-10-CM | POA: Insufficient documentation
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
## 2019-03-19 NOTE — Patient Instructions (Signed)
   TWICE A DAY:  Breathe lying down for 15 minutes  Straw singing for 3 minutes  Do the voice exercises

## 2019-03-22 ENCOUNTER — Other Ambulatory Visit: Payer: Self-pay

## 2019-03-22 ENCOUNTER — Ambulatory Visit: Payer: Medicare HMO

## 2019-03-22 DIAGNOSIS — R490 Dysphonia: Secondary | ICD-10-CM | POA: Diagnosis not present

## 2019-03-22 DIAGNOSIS — R1310 Dysphagia, unspecified: Secondary | ICD-10-CM | POA: Diagnosis not present
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
## 2019-03-25 ENCOUNTER — Ambulatory Visit (INDEPENDENT_AMBULATORY_CARE_PROVIDER_SITE_OTHER): Payer: Medicare HMO | Admitting: Podiatry

## 2019-03-25 ENCOUNTER — Other Ambulatory Visit: Payer: Self-pay

## 2019-03-25 VITALS — Temp 97.7°F

## 2019-03-25 DIAGNOSIS — R609 Edema, unspecified: Secondary | ICD-10-CM | POA: Diagnosis not present

## 2019-03-25 DIAGNOSIS — M7750 Other enthesopathy of unspecified foot: Secondary | ICD-10-CM | POA: Diagnosis not present

## 2019-03-25 DIAGNOSIS — M722 Plantar fascial fibromatosis: Secondary | ICD-10-CM

## 2019-03-25 NOTE — Patient Instructions (Signed)

## 2019-03-27 ENCOUNTER — Ambulatory Visit: Payer: Medicare HMO

## 2019-03-27 NOTE — Progress Notes (Signed)
Subjective: 68 year old female presents the office today for follow evaluation of plantar fasciitis of the left foot.  She states that the Niaspan has been helpful as well as the plantar fascial brace but she still getting discomfort.  She states that she get some discomfort on the right side as well but most of the left side.  She still gets some pain in the left ankle as well as also occasional swelling but this is intermittent.  No recent injury or falls.  No numbness or tingling.  The pain does not wake her up at night.  No weakness or falls. Denies any systemic complaints such as fevers, chills, nausea, vomiting. No acute changes since last appointment, and no other complaints at this time.   Objective: AAO x3, NAD DP/PT pulses palpable bilaterally, CRT less than 3 seconds There is still tenderness palpation along the plantar medial tubercle of the calcaneus at the insertion of plantar fascia.  Plantar fascia appears to be intact.  There is a mild discomfort in the course of the flexor tendons of the posterior tibial tendon as well as the peroneal tendon.  There is mild pitting edema present bilaterally.  No erythema or warmth. No open lesions or pre-ulcerative lesions.  No pain with calf compression, warmth, erythema  Assessment: Left foot plantar fasciitis, ankle tendinitis; bilateral swelling  Plan: -All treatment options discussed with the patient including all alternatives, risks, complications.  -Regards to her foot pain given her multiple allergies will hold off on steroids as well as anti-inflammatories given her kidney function. -At this point we recommend physical therapy.  Prescription for physical therapy was written today. -I encouraged her to follow-up with her primary care physician/cardiology in regards to the swelling.  She is having more pitting edema present bilateral. -Patient encouraged to call the office with any questions, concerns, change in symptoms.   Trula Slade DPM

## 2019-03-28 ENCOUNTER — Other Ambulatory Visit: Payer: Self-pay

## 2019-03-28 ENCOUNTER — Ambulatory Visit: Payer: Medicare HMO

## 2019-03-28 DIAGNOSIS — R1310 Dysphagia, unspecified: Secondary | ICD-10-CM

## 2019-03-28 DIAGNOSIS — R490 Dysphonia: Secondary | ICD-10-CM
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
## 2019-03-28 NOTE — Patient Instructions (Signed)
   You do better with the numbers if you listen to the lady do them first:  [email or web address]

## 2019-04-02 ENCOUNTER — Ambulatory Visit: Payer: Medicare HMO

## 2019-04-03 ENCOUNTER — Ambulatory Visit: Payer: Medicare HMO

## 2019-04-03 ENCOUNTER — Other Ambulatory Visit: Payer: Self-pay

## 2019-04-03 DIAGNOSIS — R262 Difficulty in walking, not elsewhere classified: Secondary | ICD-10-CM | POA: Diagnosis not present

## 2019-04-03 DIAGNOSIS — R490 Dysphonia: Secondary | ICD-10-CM

## 2019-04-03 DIAGNOSIS — R1310 Dysphagia, unspecified: Secondary | ICD-10-CM

## 2019-04-03 DIAGNOSIS — M6281 Muscle weakness (generalized): Secondary | ICD-10-CM | POA: Diagnosis not present

## 2019-04-03 DIAGNOSIS — M25672 Stiffness of left ankle, not elsewhere classified: Secondary | ICD-10-CM | POA: Diagnosis not present

## 2019-04-03 DIAGNOSIS — M25572 Pain in left ankle and joints of left foot: Secondary | ICD-10-CM | POA: Diagnosis not present

## 2019-04-03 DIAGNOSIS — M722 Plantar fascial fibromatosis: Secondary | ICD-10-CM | POA: Diagnosis not present
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
## 2019-04-03 NOTE — Patient Instructions (Signed)
We will see you next on 04-12-19. Please complete your PHortE exercises EVERY day, twice a day. Do your abdominal breathing (15 minutes), and your straw singing (for 2 minutes) first.

## 2019-04-05 ENCOUNTER — Ambulatory Visit: Payer: Medicare HMO

## 2019-04-08 ENCOUNTER — Other Ambulatory Visit: Payer: Self-pay | Admitting: Internal Medicine

## 2019-04-08 DIAGNOSIS — R262 Difficulty in walking, not elsewhere classified: Secondary | ICD-10-CM | POA: Diagnosis not present

## 2019-04-08 DIAGNOSIS — M25672 Stiffness of left ankle, not elsewhere classified: Secondary | ICD-10-CM | POA: Diagnosis not present

## 2019-04-08 DIAGNOSIS — M6281 Muscle weakness (generalized): Secondary | ICD-10-CM | POA: Diagnosis not present

## 2019-04-08 DIAGNOSIS — M722 Plantar fascial fibromatosis: Secondary | ICD-10-CM | POA: Diagnosis not present

## 2019-04-08 DIAGNOSIS — M25572 Pain in left ankle and joints of left foot: Secondary | ICD-10-CM | POA: Diagnosis not present

## 2019-04-09 ENCOUNTER — Ambulatory Visit: Payer: Medicare HMO | Admitting: Speech Pathology

## 2019-04-12 ENCOUNTER — Ambulatory Visit: Payer: Medicare HMO

## 2019-04-15 DIAGNOSIS — R262 Difficulty in walking, not elsewhere classified: Secondary | ICD-10-CM | POA: Diagnosis not present

## 2019-04-15 DIAGNOSIS — M25572 Pain in left ankle and joints of left foot: Secondary | ICD-10-CM | POA: Diagnosis not present

## 2019-04-15 DIAGNOSIS — M25672 Stiffness of left ankle, not elsewhere classified: Secondary | ICD-10-CM | POA: Diagnosis not present

## 2019-04-15 DIAGNOSIS — M722 Plantar fascial fibromatosis: Secondary | ICD-10-CM | POA: Diagnosis not present

## 2019-04-15 DIAGNOSIS — M6281 Muscle weakness (generalized): Secondary | ICD-10-CM | POA: Diagnosis not present

## 2019-04-16 ENCOUNTER — Other Ambulatory Visit: Payer: Self-pay

## 2019-04-16 ENCOUNTER — Ambulatory Visit: Payer: Medicare HMO | Attending: Otolaryngology

## 2019-04-16 DIAGNOSIS — R490 Dysphonia: Secondary | ICD-10-CM

## 2019-04-16 DIAGNOSIS — R1310 Dysphagia, unspecified: Secondary | ICD-10-CM | POA: Diagnosis not present

## 2019-04-16 NOTE — Patient Instructions (Addendum)
   Dhanya'S EXERCISES to do twice each day  Practice belly breathing 4-5 minutes  Straw sing 2-3 minutes  Do loud and long "ah" (STRONG BELLY) 5x  Numbers (STRONG BELLY)  Sentences (calling over the fence)  Sentences (like you're the boss)  YOU HAVE TO DO THESE 6 OUT OF 7 DAYS A WEEK TO HELP YOURSELF!
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
## 2019-04-18 DIAGNOSIS — R262 Difficulty in walking, not elsewhere classified: Secondary | ICD-10-CM | POA: Diagnosis not present

## 2019-04-18 DIAGNOSIS — M6281 Muscle weakness (generalized): Secondary | ICD-10-CM | POA: Diagnosis not present

## 2019-04-18 DIAGNOSIS — M25672 Stiffness of left ankle, not elsewhere classified: Secondary | ICD-10-CM | POA: Diagnosis not present

## 2019-04-18 DIAGNOSIS — M25572 Pain in left ankle and joints of left foot: Secondary | ICD-10-CM | POA: Diagnosis not present

## 2019-04-18 DIAGNOSIS — M722 Plantar fascial fibromatosis: Secondary | ICD-10-CM | POA: Diagnosis not present

## 2019-04-19 ENCOUNTER — Ambulatory Visit: Payer: Medicare HMO

## 2019-04-26 ENCOUNTER — Ambulatory Visit: Payer: Medicare HMO | Admitting: Cardiology

## 2019-04-29 ENCOUNTER — Other Ambulatory Visit: Payer: Self-pay

## 2019-04-29 ENCOUNTER — Ambulatory Visit (INDEPENDENT_AMBULATORY_CARE_PROVIDER_SITE_OTHER): Payer: Medicare HMO | Admitting: Podiatry

## 2019-04-29 ENCOUNTER — Encounter: Payer: Self-pay | Admitting: Podiatry

## 2019-04-29 VITALS — Temp 97.7°F

## 2019-04-29 DIAGNOSIS — R262 Difficulty in walking, not elsewhere classified: Secondary | ICD-10-CM | POA: Diagnosis not present

## 2019-04-29 DIAGNOSIS — M25672 Stiffness of left ankle, not elsewhere classified: Secondary | ICD-10-CM | POA: Diagnosis not present

## 2019-04-29 DIAGNOSIS — M722 Plantar fascial fibromatosis: Secondary | ICD-10-CM

## 2019-04-29 DIAGNOSIS — M6281 Muscle weakness (generalized): Secondary | ICD-10-CM | POA: Diagnosis not present

## 2019-04-29 DIAGNOSIS — M25572 Pain in left ankle and joints of left foot: Secondary | ICD-10-CM | POA: Diagnosis not present

## 2019-05-02 ENCOUNTER — Other Ambulatory Visit: Payer: Self-pay | Admitting: Nurse Practitioner

## 2019-05-02 DIAGNOSIS — R262 Difficulty in walking, not elsewhere classified: Secondary | ICD-10-CM | POA: Diagnosis not present

## 2019-05-02 DIAGNOSIS — M25672 Stiffness of left ankle, not elsewhere classified: Secondary | ICD-10-CM | POA: Diagnosis not present

## 2019-05-02 DIAGNOSIS — F411 Generalized anxiety disorder: Secondary | ICD-10-CM

## 2019-05-02 DIAGNOSIS — M722 Plantar fascial fibromatosis: Secondary | ICD-10-CM | POA: Diagnosis not present

## 2019-05-02 DIAGNOSIS — M25572 Pain in left ankle and joints of left foot: Secondary | ICD-10-CM | POA: Diagnosis not present

## 2019-05-02 DIAGNOSIS — M6281 Muscle weakness (generalized): Secondary | ICD-10-CM | POA: Diagnosis not present

## 2019-05-02 NOTE — Telephone Encounter (Signed)
[name] please advise, last ov was 02/26/2019 Last refill 02/08/2019 for 45 tab with 1 refill PMP checked last 3 pick up at [facility] for 45 tab was 12/31/2018, 02/08/2019 and 03/25/2019.

## 2019-05-05 DIAGNOSIS — M722 Plantar fascial fibromatosis: Secondary | ICD-10-CM | POA: Insufficient documentation

## 2019-05-05 NOTE — Progress Notes (Signed)
Subjective: 68 year old female presents the office today for follow evaluation of plantar fasciitis of the left foot.  Overall she feels she is doing much better physical therapy has been helpful.  She did have to hold off over the present time because she has back however she is scheduled to get started again.  She says she is happy with progress so far. Denies any systemic complaints such as fevers, chills, nausea, vomiting. No acute changes since last appointment, and no other complaints at this time.   Objective: AAO x3, NAD DP/PT pulses palpable bilaterally, CRT less than 3 seconds There is still tenderness to palpation along the plantar medial tubercle of the calcaneus at the insertion of plantar fascia.  Overall visibility improved.  Plantar fascia appears to be intact.  There is a mild discomfort in the course of the flexor tendons of the posterior tibial tendon as well as the peroneal tendon.  There is mild pitting edema present bilaterally.  No erythema or warmth. No open lesions or pre-ulcerative lesions.  No pain with calf compression, warmth, erythema  Assessment: Left foot plantar fasciitis, ankle tendinitis  Plan: -All treatment options discussed with the patient including all alternatives, risks, complications.  -Patient will continue physical therapy.  She has been making progress.  Future discussed surgical intervention however she would like to hold off on this and I am agreement with this.  We will consider a course of physical therapy.  I will see her back at that point her skin issues are to arise.  Trula Slade DPM
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
## 2019-05-06 ENCOUNTER — Encounter: Payer: Self-pay | Admitting: Cardiology

## 2019-05-06 ENCOUNTER — Other Ambulatory Visit: Payer: Self-pay

## 2019-05-06 ENCOUNTER — Ambulatory Visit (INDEPENDENT_AMBULATORY_CARE_PROVIDER_SITE_OTHER): Payer: Medicare HMO | Admitting: Cardiology

## 2019-05-06 VITALS — BP 147/89 | HR 71 | Temp 97.3°F | Ht 63.0 in | Wt 182.0 lb

## 2019-05-06 DIAGNOSIS — R002 Palpitations: Secondary | ICD-10-CM | POA: Diagnosis not present

## 2019-05-06 DIAGNOSIS — R079 Chest pain, unspecified: Secondary | ICD-10-CM

## 2019-05-06 DIAGNOSIS — R0789 Other chest pain: Secondary | ICD-10-CM

## 2019-05-06 DIAGNOSIS — I10 Essential (primary) hypertension: Secondary | ICD-10-CM | POA: Diagnosis not present

## 2019-05-06 MED ORDER — AMLODIPINE BESYLATE 5 MG PO TABS: ORAL_TABLET | ORAL | 3 refills | Status: DC

## 2019-05-06 NOTE — Patient Instructions (Addendum)
Medication Instructions:  INCREASE YOUR AMLODIPINE TO 5 MG  1 AND 1/2 TABLETS DAILY   If you need a refill on your cardiac medications before your next appointment, please call your pharmacy.   Lab work: NONE  Testing/Procedures: NONE  Follow-Up: At [facility], you and your health needs are our priority.  As part of our continuing mission to provide you with exceptional heart care, we have created designated Provider Care Teams.  These Care Teams include your primary Cardiologist (physician) and Advanced Practice Providers (APPs -  Physician Assistants and Nurse Practitioners) who all work together to provide you with the care you need, when you need it. You will need a follow up appointment in 12 months.  Please call our office 2 months in advance to schedule this appointment.  You may see Minus Breeding, MD or one of the following Advanced Practice Providers on your designated Care Team:   Rosaria Ferries, PA-C . Jory Sims, DNP, ANP

## 2019-05-07 DIAGNOSIS — R262 Difficulty in walking, not elsewhere classified: Secondary | ICD-10-CM | POA: Diagnosis not present

## 2019-05-07 DIAGNOSIS — M25672 Stiffness of left ankle, not elsewhere classified: Secondary | ICD-10-CM | POA: Diagnosis not present

## 2019-05-07 DIAGNOSIS — M722 Plantar fascial fibromatosis: Secondary | ICD-10-CM | POA: Diagnosis not present

## 2019-05-07 DIAGNOSIS — M25572 Pain in left ankle and joints of left foot: Secondary | ICD-10-CM | POA: Diagnosis not present

## 2019-05-07 DIAGNOSIS — M6281 Muscle weakness (generalized): Secondary | ICD-10-CM | POA: Diagnosis not present

## 2019-05-09 ENCOUNTER — Ambulatory Visit (HOSPITAL_BASED_OUTPATIENT_CLINIC_OR_DEPARTMENT_OTHER): Payer: Medicare HMO

## 2019-05-09 ENCOUNTER — Telehealth: Payer: Self-pay | Admitting: *Deleted

## 2019-05-09 ENCOUNTER — Other Ambulatory Visit: Payer: Self-pay

## 2019-05-09 ENCOUNTER — Emergency Department (HOSPITAL_COMMUNITY)
Admission: EM | Admit: 2019-05-09 | Discharge: 2019-05-09 | Disposition: A | Discharge status: Home / Self Care | Payer: Medicare HMO | Attending: Emergency Medicine | Admitting: Emergency Medicine

## 2019-05-09 ENCOUNTER — Encounter (HOSPITAL_COMMUNITY): Payer: Self-pay

## 2019-05-09 DIAGNOSIS — I2510 Atherosclerotic heart disease of native coronary artery without angina pectoris: Secondary | ICD-10-CM | POA: Diagnosis not present

## 2019-05-09 DIAGNOSIS — Z79899 Other long term (current) drug therapy: Secondary | ICD-10-CM | POA: Insufficient documentation

## 2019-05-09 DIAGNOSIS — M25672 Stiffness of left ankle, not elsewhere classified: Secondary | ICD-10-CM | POA: Diagnosis not present

## 2019-05-09 DIAGNOSIS — I5032 Chronic diastolic (congestive) heart failure: Secondary | ICD-10-CM | POA: Insufficient documentation

## 2019-05-09 DIAGNOSIS — R262 Difficulty in walking, not elsewhere classified: Secondary | ICD-10-CM | POA: Diagnosis not present

## 2019-05-09 DIAGNOSIS — J45909 Unspecified asthma, uncomplicated: Secondary | ICD-10-CM | POA: Insufficient documentation

## 2019-05-09 DIAGNOSIS — Z87891 Personal history of nicotine dependence: Secondary | ICD-10-CM | POA: Diagnosis not present

## 2019-05-09 DIAGNOSIS — Z8673 Personal history of transient ischemic attack (TIA), and cerebral infarction without residual deficits: Secondary | ICD-10-CM | POA: Insufficient documentation

## 2019-05-09 DIAGNOSIS — I110 Hypertensive heart disease with heart failure: Secondary | ICD-10-CM | POA: Diagnosis not present

## 2019-05-09 DIAGNOSIS — M79661 Pain in right lower leg: Secondary | ICD-10-CM | POA: Insufficient documentation

## 2019-05-09 DIAGNOSIS — M79604 Pain in right leg: Secondary | ICD-10-CM | POA: Diagnosis not present

## 2019-05-09 DIAGNOSIS — M7989 Other specified soft tissue disorders: Secondary | ICD-10-CM | POA: Diagnosis not present

## 2019-05-09 DIAGNOSIS — R52 Pain, unspecified: Secondary | ICD-10-CM

## 2019-05-09 DIAGNOSIS — R609 Edema, unspecified: Secondary | ICD-10-CM

## 2019-05-09 DIAGNOSIS — Z91040 Latex allergy status: Secondary | ICD-10-CM | POA: Insufficient documentation

## 2019-05-09 DIAGNOSIS — Z853 Personal history of malignant neoplasm of breast: Secondary | ICD-10-CM | POA: Diagnosis not present

## 2019-05-09 DIAGNOSIS — Z7982 Long term (current) use of aspirin: Secondary | ICD-10-CM | POA: Diagnosis not present

## 2019-05-09 DIAGNOSIS — M6281 Muscle weakness (generalized): Secondary | ICD-10-CM | POA: Diagnosis not present

## 2019-05-09 DIAGNOSIS — M25572 Pain in left ankle and joints of left foot: Secondary | ICD-10-CM | POA: Diagnosis not present

## 2019-05-09 DIAGNOSIS — M722 Plantar fascial fibromatosis: Secondary | ICD-10-CM | POA: Diagnosis not present

## 2019-05-09 MED ORDER — FUROSEMIDE 20 MG PO TABS: 20.0000 mg | ORAL_TABLET | Freq: Every day | ORAL | 0 refills | Status: DC

## 2019-05-09 NOTE — Telephone Encounter (Signed)
I informed pt's friend Kyndle Meggitt that pt needed to go to the [facility] ED now, and he stated he would be on his way.

## 2019-05-09 NOTE — ED Triage Notes (Signed)
Pt c/o that for 3 days her right leg has felt "hot" and 4/10 pain. Pt states that she did not take her blood pressure meds this morning. Pt came from PT and they recommended that she come to the ED for an ultrasound to rule out blood clots.

## 2019-05-09 NOTE — Progress Notes (Signed)
Right lower extremity venous duplex completed. Refer to "CV Proc" under chart review to view preliminary results.  05/09/2019 6:47 PM Maudry Mayhew, MHA, RVT, RDCS, RDMS

## 2019-05-09 NOTE — Telephone Encounter (Signed)
I called [employer] to cancel order with Amy W., she stated she did not see a Case, therefore nothing to cancel.

## 2019-05-09 NOTE — Telephone Encounter (Signed)
[facility] - MEDICAID NOTIFICATION STATES, " THIS [place] MEDICAID MEMBER DOES NOT REQUIRE PRIOR AUTHORIZATION FOR OUTPATIENT RADIOLOGY THROUGH [facility] OR [place] [facility] AT THIS TIME." Faxed orders to [facility]. 

## 2019-05-09 NOTE — Telephone Encounter (Signed)
I informed [facility] that pt is going to the ED and to cancel tomorrow's appt.
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
## 2019-05-09 NOTE — Telephone Encounter (Signed)
[facility] scheduled pt for tomorrow arrive 1:45pm for 2:00pm testing.

## 2019-05-09 NOTE — Telephone Encounter (Signed)
Dr. Jacqualyn Posey states [facility] - In-office-[facility], PT ask him to evaluate pt for + right leg DVT, pain and swelling started. Dr. Jacqualyn Posey evaluated and request stat venous doppler right leg. Left message for [facility] to call to schedule urgent Venous Doppler right leg.

## 2019-05-09 NOTE — Telephone Encounter (Signed)
I called [facility] ED and informed Triage Nurse-Sheila, pt was on the way in private vehicle with + signs of right leg DVT.

## 2019-05-09 NOTE — Discharge Instructions (Signed)
Take lasix 20 mg as needed for swelling.   Use compression stockings and keep leg elevated   See your doctor for follow up   Return to ER if you have worse leg swelling, calf pain, trouble breathing

## 2019-05-10 ENCOUNTER — Encounter (HOSPITAL_COMMUNITY): Payer: Medicare HMO

## 2019-05-14 ENCOUNTER — Other Ambulatory Visit: Payer: Self-pay | Admitting: Nurse Practitioner

## 2019-05-14 ENCOUNTER — Ambulatory Visit: Payer: Medicare HMO | Admitting: Nurse Practitioner

## 2019-05-14 DIAGNOSIS — M25572 Pain in left ankle and joints of left foot: Secondary | ICD-10-CM | POA: Diagnosis not present

## 2019-05-14 DIAGNOSIS — M6281 Muscle weakness (generalized): Secondary | ICD-10-CM | POA: Diagnosis not present

## 2019-05-14 DIAGNOSIS — R262 Difficulty in walking, not elsewhere classified: Secondary | ICD-10-CM | POA: Diagnosis not present

## 2019-05-14 DIAGNOSIS — M25672 Stiffness of left ankle, not elsewhere classified: Secondary | ICD-10-CM | POA: Diagnosis not present

## 2019-05-14 DIAGNOSIS — M722 Plantar fascial fibromatosis: Secondary | ICD-10-CM | POA: Diagnosis not present

## 2019-05-14 DIAGNOSIS — Z1231 Encounter for screening mammogram for malignant neoplasm of breast: Secondary | ICD-10-CM

## 2019-05-16 ENCOUNTER — Telehealth: Payer: Self-pay | Admitting: Nurse Practitioner

## 2019-05-16 DIAGNOSIS — M25572 Pain in left ankle and joints of left foot: Secondary | ICD-10-CM | POA: Diagnosis not present

## 2019-05-16 DIAGNOSIS — M6281 Muscle weakness (generalized): Secondary | ICD-10-CM | POA: Diagnosis not present

## 2019-05-16 DIAGNOSIS — M25672 Stiffness of left ankle, not elsewhere classified: Secondary | ICD-10-CM | POA: Diagnosis not present

## 2019-05-16 DIAGNOSIS — R262 Difficulty in walking, not elsewhere classified: Secondary | ICD-10-CM | POA: Diagnosis not present

## 2019-05-16 DIAGNOSIS — M722 Plantar fascial fibromatosis: Secondary | ICD-10-CM | POA: Diagnosis not present

## 2019-05-16 NOTE — Telephone Encounter (Signed)

## 2019-05-17 ENCOUNTER — Ambulatory Visit (INDEPENDENT_AMBULATORY_CARE_PROVIDER_SITE_OTHER): Payer: Medicare HMO | Admitting: Nurse Practitioner

## 2019-05-17 ENCOUNTER — Other Ambulatory Visit: Payer: Self-pay

## 2019-05-17 ENCOUNTER — Other Ambulatory Visit: Payer: Self-pay | Admitting: Nurse Practitioner

## 2019-05-17 VITALS — BP 124/80 | HR 73 | Temp 98.0°F | Ht 63.0 in | Wt 183.4 lb

## 2019-05-17 DIAGNOSIS — Z23 Encounter for immunization: Secondary | ICD-10-CM

## 2019-05-17 DIAGNOSIS — I10 Essential (primary) hypertension: Secondary | ICD-10-CM

## 2019-05-17 DIAGNOSIS — R600 Localized edema: Secondary | ICD-10-CM

## 2019-05-17 MED ORDER — TRIAMTERENE-HCTZ 37.5-25 MG PO TABS: 1.0000 | ORAL_TABLET | Freq: Every day | ORAL | 2 refills | Status: DC

## 2019-05-17 MED ORDER — TRIAMTERENE-HCTZ 37.5-25 MG PO TABS: 0.5000 | ORAL_TABLET | Freq: Every day | ORAL | 2 refills | Status: DC

## 2019-05-17 NOTE — Patient Instructions (Addendum)
Resume maxzide once a day.(1tab once a day x 5days, then decrease to half tab daily continuously)  Use compression stocking during the day and off at night.   Chronic Venous Insufficiency Chronic venous insufficiency is a condition where the leg veins cannot effectively pump blood from the legs to the heart. This happens when the vein walls are either stretched, weakened, or damaged, or when the valves inside the vein are damaged. With the right treatment, you should be able to continue with an active life. This condition is also called venous stasis. What are the causes? Common causes of this condition include:  High blood pressure inside the veins (venous hypertension).  Sitting or standing too long, causing increased blood pressure in the leg veins.  A blood clot that blocks blood flow in a vein (deep vein thrombosis, DVT).  Inflammation of a vein (phlebitis) that causes a blood clot to form.  Tumors in the pelvis that cause blood to back up. What increases the risk? The following factors may make you more likely to develop this condition:  Having a family history of this condition.  Obesity.  Pregnancy.  Living without enough regular physical activity or exercise (sedentary lifestyle).  Smoking.  Having a job that requires long periods of standing or sitting in one place.  Being a certain age. Women in their 76s and 78s and men in their 30s are more likely to develop this condition. What are the signs or symptoms? Symptoms of this condition include:  Veins that are enlarged, bulging, or twisted (varicose veins).  Skin breakdown or ulcers.  Reddened skin or dark discoloration of skin on the leg between the knee and ankle.  Brown, smooth, tight, and painful skin just above the ankle, usually on the inside of the leg (lipodermatosclerosis).  Swelling of the legs. How is this diagnosed? This condition may be diagnosed based on:  Your medical history.  A physical  exam.  Tests, such as: ? A procedure that creates an image of a blood vessel and nearby organs and provides information about blood flow through the blood vessel (duplex ultrasound). ? A procedure that tests blood flow (plethysmography). ? A procedure that looks at the veins using X-ray and dye (venogram). How is this treated? The goals of treatment are to help you return to an active life and to minimize pain or disability. Treatment depends on the severity of your condition, and it may include:  Wearing compression stockings. These can help relieve symptoms and help prevent your condition from getting worse. However, they do not cure the condition.  Sclerotherapy. This procedure involves an injection of a solution that shrinks damaged veins.  Surgery. This may involve: ? Removing a diseased vein (vein stripping). ? Cutting off blood flow through the vein (laser ablation surgery). ? Repairing or reconstructing a valve within the affected vein. Follow these instructions at home:      Wear compression stockings as told by your health care provider. These stockings help to prevent blood clots and reduce swelling in your legs.  Take over-the-counter and prescription medicines only as told by your health care provider.  Stay active by exercising, walking, or doing different activities. Ask your health care provider what activities are safe for you and how much exercise you need.  Drink enough fluid to keep your urine pale yellow.  Do not use any products that contain nicotine or tobacco, such as cigarettes, e-cigarettes, and chewing tobacco. If you need help quitting, ask your health care  provider.  Keep all follow-up visits as told by your health care provider. This is important. Contact a health care provider if you:  Have redness, swelling, or more pain in the affected area.  See a red streak or line that goes up or down from the affected area.  Have skin breakdown or skin loss  in the affected area, even if the breakdown is small.  Get an injury in the affected area. Get help right away if:  You get an injury and an open wound in the affected area.  You have: ? Severe pain that does not get better with medicine. ? Sudden numbness or weakness in the foot or ankle below the affected area. ? Trouble moving your foot or ankle. ? A fever. ? Worse or persistent symptoms. ? Chest pain. ? Shortness of breath. Summary  Chronic venous insufficiency is a condition where the leg veins cannot effectively pump blood from the legs to the heart.  Chronic venous insufficiency occurs when the vein walls become stretched, weakened, or damaged, or when valves within the vein are damaged.  Treatment depends on how severe your condition is. It often involves wearing compression stockings and may involve having a procedure.  Make sure you stay active by exercising, walking, or doing different activities. Ask your health care provider what activities are safe for you and how much exercise you need. This information is not intended to replace advice given to you by your health care provider. Make sure you discuss any questions you have with your health care provider. Document Released: 01/02/2007 Document Revised: 05/22/2018 Document Reviewed: 05/22/2018 Elsevier Patient Education  2020 [employer].

## 2019-05-17 NOTE — Progress Notes (Signed)
Subjective:  Patient ID: [name], female    DOB: 1950/10/04  Age: 68 y.o. MRN: [id number]  CC: Hospitalization Follow-up (pt states that right leg is still swollen around knee and gets warm/negative for blood clot / pt states gained 2 lbs since yesterday/ wants flu shot )  HPI  LE edema: Chronic, waxing and waning, discontinued HCTZ, furosemide used of 3days with some improvement. Venous doppler was negative for DVT and throboplebitis. No SOB, no PND, no chest pain.  Reviewed past Medical, Social and Family history today.  Outpatient Medications Prior to Visit  Medication Sig Dispense Refill   acetaminophen (TYLENOL) 500 MG tablet Take 500-1,000 mg by mouth every 6 (six) hours as needed for moderate pain.      ALPRAZolam (XANAX) 1 MG tablet Take 0.5 tablets (0.5 mg total) by mouth every 8 (eight) hours as needed for anxiety. Needs office visit for additional refills 45 tablet 1   amLODipine (NORVASC) 5 MG tablet TAKE 1 AND 1/2 TABLET DAILY 135 tablet 3   aspirin EC 81 MG tablet Take 81 mg by mouth daily.     Calcium Carbonate-Vitamin D (CALCIUM-CARB 600 + D) 600-125 MG-UNIT TABS Take 1 tablet by mouth daily.      cetirizine (ZYRTEC) 10 MG tablet Take 1 tablet (10 mg total) by mouth at bedtime. 30 tablet 1   fluticasone (FLONASE) 50 MCG/ACT nasal spray      fluticasone (FLOVENT HFA) 110 MCG/ACT inhaler Inhale 2 puffs into the lungs 2 (two) times daily. 1 Inhaler 6   losartan (COZAAR) 100 MG tablet Take 1 tablet (100 mg total) by mouth daily. 90 tablet 3   meclizine (ANTIVERT) 25 MG tablet      metoprolol tartrate (LOPRESSOR) 100 MG tablet Take 1 tablet (100 mg total) by mouth 2 (two) times daily. 180 tablet 3   pantoprazole (PROTONIX) 40 MG tablet TAKE 1 TABLET (40 MG TOTAL) BY MOUTH DAILY. 90 tablet 0   pravastatin (PRAVACHOL) 40 MG tablet TAKE 1 TABLET BY MOUTH IN THE EVENING 90 tablet 3   RESTASIS 0.05 % ophthalmic emulsion Place 1 drop into both eyes 2 (two)  times daily.  3   valACYclovir (VALTREX) 500 MG tablet TK 1 T PO BID FOR 3 DAYS     VENTOLIN HFA 108 (90 Base) MCG/ACT inhaler Inhale 2 puffs into the lungs every 6 (six) hours as needed for wheezing or shortness of breath. 1 Inhaler 3   vitamin C (ASCORBIC ACID) 500 MG tablet Take 500 mg by mouth daily.     VITAMIN E PO Take by mouth.     triamterene-hydrochlorothiazide (MAXZIDE-25) 37.5-25 MG tablet Take 1 tablet by mouth daily. 30 tablet 2   furosemide (LASIX) 20 MG tablet Take 1 tablet (20 mg total) by mouth daily. (Patient not taking: Reported on 05/17/2019) 3 tablet 0   vitamin B-12 (CYANOCOBALAMIN) 1000 MCG tablet Take 1 tablet (1,000 mcg total) by mouth daily. (Patient not taking: Reported on 05/17/2019) 30 tablet 1   No facility-administered medications prior to visit.     ROS See HPI  Objective:  BP 124/80    Pulse 73    Temp 98 F (36.7 C) (Oral)    Ht 5\' 3"  (1.6 m)    Wt 183 lb 6.4 oz (83.2 kg)    SpO2 97%    BMI 32.49 kg/m   BP Readings from Last 3 Encounters:  05/17/19 124/80  05/09/19 (!) 146/89  05/06/19 (!) 147/89  Wt Readings from Last 3 Encounters:  05/17/19 183 lb 6.4 oz (83.2 kg)  05/09/19 176 lb (79.8 kg)  05/06/19 182 lb (82.6 kg)    Physical Exam Vitals signs reviewed.  Neck:     Musculoskeletal: Normal range of motion and neck supple.  Cardiovascular:     Rate and Rhythm: Normal rate and regular rhythm.     Pulses: Normal pulses.     Heart sounds: Normal heart sounds.  Pulmonary:     Effort: Pulmonary effort is normal.     Breath sounds: Normal breath sounds.  Musculoskeletal:     Right lower leg: Edema present.     Left lower leg: Edema present.  Skin:    Findings: No erythema or rash.  Neurological:     Mental Status: She is alert and oriented to person, place, and time.     Lab Results  Component Value Date   WBC 4.0 10/17/2018   HGB 13.3 10/17/2018   HCT 39.6 10/17/2018   PLT 188.0 10/17/2018   GLUCOSE 104 (H) 02/26/2019    CHOL 141 07/13/2018   TRIG 96 07/13/2018   HDL 38 (L) 07/13/2018   LDLCALC 84 07/13/2018   ALT 9 06/12/2018   AST 13 06/12/2018   NA 142 02/26/2019   K 4.3 02/26/2019   CL 104 02/26/2019   CREATININE 0.89 02/26/2019   BUN 14 02/26/2019   CO2 28 02/26/2019   TSH 1.31 02/26/2019   INR 1.02 08/17/2013   HGBA1C 6.1 (H) 02/26/2019    Vas [place] Lower Extremity Venous (dvt) (only [facility])  Result Date: 05/10/2019  Lower Venous Study Indications: Pain, and Swelling.  Comparison Study: No prior study. Performing Technologist: Maudry Mayhew MHA, RDMS, RVT, RDCS  Examination Guidelines: A complete evaluation includes B-mode imaging, spectral Doppler, color Doppler, and power Doppler as needed of all accessible portions of each vessel. Bilateral testing is considered an integral part of a complete examination. Limited examinations for reoccurring indications may be performed as noted.  +---------+---------------+---------+-----------+----------+--------------+  RIGHT     Compressibility Phasicity Spontaneity Properties Thrombus Aging  +---------+---------------+---------+-----------+----------+--------------+  CFV       Full            Yes       Yes                                    +---------+---------------+---------+-----------+----------+--------------+  SFJ       Full                                                             +---------+---------------+---------+-----------+----------+--------------+  FV Prox   Full                                                             +---------+---------------+---------+-----------+----------+--------------+  FV Mid    Full                                                             +---------+---------------+---------+-----------+----------+--------------+  FV Distal Full                                                             +---------+---------------+---------+-----------+----------+--------------+  PFV       Full                                                              +---------+---------------+---------+-----------+----------+--------------+  POP       Full            Yes       Yes                                    +---------+---------------+---------+-----------+----------+--------------+  PTV       Full                                                             +---------+---------------+---------+-----------+----------+--------------+  PERO      Full                                                             +---------+---------------+---------+-----------+----------+--------------+   +----+---------------+---------+-----------+----------+--------------+  LEFT Compressibility Phasicity Spontaneity Properties Thrombus Aging  +----+---------------+---------+-----------+----------+--------------+  CFV  Full            Yes       Yes                                    +----+---------------+---------+-----------+----------+--------------+     Summary: Right: There is no evidence of deep vein thrombosis in the lower extremity. No cystic structure found in the popliteal fossa. Left: No evidence of common femoral vein obstruction.  *See table(s) above for measurements and observations. Electronically signed by Deitra Mayo MD on 05/10/2019 at 10:49:29 AM.    Final     Assessment & Plan:   Katherinne was seen today for hospitalization follow-up.  Diagnoses and all orders for this visit:  Essential hypertension -     Discontinue: triamterene-hydrochlorothiazide (MAXZIDE-25) 37.5-25 MG tablet; Take 1 tablet by mouth daily. -     Discontinue: triamterene-hydrochlorothiazide (MAXZIDE-25) 37.5-25 MG tablet; Take 0.5 tablets by mouth daily.  Bilateral leg edema -     Discontinue: triamterene-hydrochlorothiazide (MAXZIDE-25) 37.5-25 MG tablet; Take 1 tablet by mouth daily. -     Discontinue: triamterene-hydrochlorothiazide (MAXZIDE-25) 37.5-25 MG tablet; Take 0.5 tablets by mouth daily.  Need for immunization against influenza -     Flu Vaccine QUAD  High Dose(Fluad)   I have discontinued Jeani Hawking E. Roberts's vitamin B-12, triamterene-hydrochlorothiazide, furosemide, and triamterene-hydrochlorothiazide. I am also  having her maintain her Calcium Carbonate-Vitamin D, aspirin EC, acetaminophen, Restasis, cetirizine, losartan, metoprolol tartrate, pravastatin, fluticasone, Ventolin HFA, valACYclovir, fluticasone, meclizine, pantoprazole, ALPRAZolam, vitamin C, VITAMIN E PO, and amLODipine.  Meds ordered this encounter  Medications   DISCONTD: triamterene-hydrochlorothiazide (MAXZIDE-25) 37.5-25 MG tablet    Sig: Take 1 tablet by mouth daily.    Dispense:  30 tablet    Refill:  2    Order Specific Question:   Supervising Provider    Answer:   MATTHEWS, CODY [id number]   DISCONTD: triamterene-hydrochlorothiazide (MAXZIDE-25) 37.5-25 MG tablet    Sig: Take 0.5 tablets by mouth daily.    Dispense:  30 tablet    Refill:  2    Order Specific Question:   Supervising Provider    Answer:   MATTHEWS, CODY [[id number]]    Problem List Items Addressed This Visit      Cardiovascular and Mediastinum   Essential hypertension (Chronic)    Other Visit Diagnoses    Bilateral leg edema       Need for immunization against influenza       Relevant Orders   Flu Vaccine QUAD High Dose(Fluad) (Completed)       Follow-up: Return in about 3 months (around 08/16/2019) for CPE(fasting).  Wilfred Lacy, NP

## 2019-05-19 ENCOUNTER — Encounter: Payer: Self-pay | Admitting: Nurse Practitioner

## 2019-05-19 DIAGNOSIS — R600 Localized edema: Secondary | ICD-10-CM | POA: Insufficient documentation

## 2019-05-21 DIAGNOSIS — M25672 Stiffness of left ankle, not elsewhere classified: Secondary | ICD-10-CM | POA: Diagnosis not present

## 2019-05-21 DIAGNOSIS — M6281 Muscle weakness (generalized): Secondary | ICD-10-CM | POA: Diagnosis not present

## 2019-05-21 DIAGNOSIS — R262 Difficulty in walking, not elsewhere classified: Secondary | ICD-10-CM | POA: Diagnosis not present

## 2019-05-21 DIAGNOSIS — M722 Plantar fascial fibromatosis: Secondary | ICD-10-CM | POA: Diagnosis not present

## 2019-05-21 DIAGNOSIS — M25572 Pain in left ankle and joints of left foot: Secondary | ICD-10-CM | POA: Diagnosis not present

## 2019-05-27 ENCOUNTER — Encounter: Payer: Self-pay | Admitting: Podiatry

## 2019-05-27 ENCOUNTER — Other Ambulatory Visit: Payer: Self-pay

## 2019-05-27 ENCOUNTER — Ambulatory Visit (INDEPENDENT_AMBULATORY_CARE_PROVIDER_SITE_OTHER): Payer: Medicare HMO | Admitting: Podiatry

## 2019-05-27 DIAGNOSIS — G8929 Other chronic pain: Secondary | ICD-10-CM

## 2019-05-27 DIAGNOSIS — I872 Venous insufficiency (chronic) (peripheral): Secondary | ICD-10-CM

## 2019-05-27 DIAGNOSIS — M722 Plantar fascial fibromatosis: Secondary | ICD-10-CM

## 2019-05-27 DIAGNOSIS — M79672 Pain in left foot: Secondary | ICD-10-CM | POA: Diagnosis not present

## 2019-06-06 NOTE — Progress Notes (Signed)
Subjective: 68 year old female presents the office today for evaluation of left foot plantar fasciitis as well as tendinitis and swelling.  While in physical therapy she had increased swelling and pain to her leg and venous duplex was ordered that was negative for DVT.  She follow-up with her primary care physician still she has venous insufficiency and recommended compression socks which is been helpful.  Physical therapy is also helpful.  She still gets some discomfort to her foot after she walks the base has helped as well. Denies any systemic complaints such as fevers, chills, nausea, vomiting. No acute changes since last appointment, and no other complaints at this time.   Objective: AAO x3, NAD DP/PT pulses palpable bilaterally, CRT less than 3 seconds There is minimal discomfort to palpation on the plantar medial tubercle of the calcaneus at insertion of plantar fashion left foot.  Plantar fascial, Achilles tendon, flexor, extensor tendons appear to be intact.  No edema, erythema.  No pain with calf compression, swelling, warmth, erythema  Assessment: Left foot resolving plan fasciitis, venous insufficiency  Plan: -All treatment options discussed with the patient including all alternatives, risks, complications.  -Continue stretching, icing daily.  Power step inserts were dispensed.  On the way out she states that they felt great and her pain was gone. -Continue compression socks. -Patient encouraged to call the office with any questions, concerns, change in symptoms.   Return in about 6 weeks (around 07/08/2019), or if symptoms worsen or fail to improve.  Trula Slade DPM

## 2019-06-18 ENCOUNTER — Telehealth: Payer: Self-pay | Admitting: Adult Health

## 2019-06-18 NOTE — Telephone Encounter (Signed)
Returned patient's phone call regarding an appointment, patient confirmed date and time.

## 2019-06-20 ENCOUNTER — Other Ambulatory Visit: Payer: Self-pay

## 2019-06-20 ENCOUNTER — Inpatient Hospital Stay: Payer: Medicare HMO | Attending: Adult Health | Admitting: Adult Health

## 2019-06-20 ENCOUNTER — Encounter: Payer: Self-pay | Admitting: Adult Health

## 2019-06-20 VITALS — BP 145/79 | HR 92 | Temp 99.2°F | Resp 18 | Ht 63.0 in | Wt 179.9 lb

## 2019-06-20 DIAGNOSIS — Z79899 Other long term (current) drug therapy: Secondary | ICD-10-CM | POA: Insufficient documentation

## 2019-06-20 DIAGNOSIS — Z170 Estrogen receptor positive status [ER+]: Secondary | ICD-10-CM | POA: Diagnosis not present

## 2019-06-20 DIAGNOSIS — J45909 Unspecified asthma, uncomplicated: Secondary | ICD-10-CM | POA: Diagnosis not present

## 2019-06-20 DIAGNOSIS — Z7982 Long term (current) use of aspirin: Secondary | ICD-10-CM | POA: Diagnosis not present

## 2019-06-20 DIAGNOSIS — E785 Hyperlipidemia, unspecified: Secondary | ICD-10-CM | POA: Insufficient documentation

## 2019-06-20 DIAGNOSIS — Z923 Personal history of irradiation: Secondary | ICD-10-CM | POA: Insufficient documentation

## 2019-06-20 DIAGNOSIS — K219 Gastro-esophageal reflux disease without esophagitis: Secondary | ICD-10-CM | POA: Diagnosis not present

## 2019-06-20 DIAGNOSIS — M129 Arthropathy, unspecified: Secondary | ICD-10-CM | POA: Insufficient documentation

## 2019-06-20 DIAGNOSIS — I129 Hypertensive chronic kidney disease with stage 1 through stage 4 chronic kidney disease, or unspecified chronic kidney disease: Secondary | ICD-10-CM | POA: Insufficient documentation

## 2019-06-20 DIAGNOSIS — G4730 Sleep apnea, unspecified: Secondary | ICD-10-CM | POA: Insufficient documentation

## 2019-06-20 DIAGNOSIS — F419 Anxiety disorder, unspecified: Secondary | ICD-10-CM | POA: Insufficient documentation

## 2019-06-20 DIAGNOSIS — C50211 Malignant neoplasm of upper-inner quadrant of right female breast: Secondary | ICD-10-CM | POA: Diagnosis not present

## 2019-06-20 DIAGNOSIS — I2510 Atherosclerotic heart disease of native coronary artery without angina pectoris: Secondary | ICD-10-CM | POA: Insufficient documentation

## 2019-06-20 DIAGNOSIS — Z8673 Personal history of transient ischemic attack (TIA), and cerebral infarction without residual deficits: Secondary | ICD-10-CM | POA: Insufficient documentation
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
## 2019-06-21 ENCOUNTER — Telehealth: Payer: Self-pay | Admitting: Adult Health

## 2019-06-21 NOTE — Telephone Encounter (Signed)
I talk with patient regarding schedule  

## 2019-06-28 ENCOUNTER — Other Ambulatory Visit: Payer: Self-pay

## 2019-06-28 ENCOUNTER — Ambulatory Visit
Admission: RE | Admit: 2019-06-28 | Discharge: 2019-06-28 | Disposition: A | Discharge status: Home / Self Care | Payer: Medicare HMO | Source: Ambulatory Visit | Attending: Nurse Practitioner | Admitting: Nurse Practitioner

## 2019-06-28 DIAGNOSIS — Z1231 Encounter for screening mammogram for malignant neoplasm of breast: Secondary | ICD-10-CM

## 2019-06-28 HISTORY — DX: Personal history of irradiation: Z92.3

## 2019-06-28 HISTORY — DX: Malignant neoplasm of unspecified site of unspecified female breast: C50.919

## 2019-06-28 HISTORY — DX: Personal history of antineoplastic chemotherapy: Z92.21

## 2019-07-08 ENCOUNTER — Ambulatory Visit: Payer: Medicare HMO | Admitting: Podiatry

## 2019-07-25 ENCOUNTER — Other Ambulatory Visit: Payer: Self-pay | Admitting: Nurse Practitioner

## 2019-07-25 ENCOUNTER — Encounter: Payer: Self-pay | Admitting: Nurse Practitioner

## 2019-07-25 ENCOUNTER — Other Ambulatory Visit: Payer: Self-pay | Admitting: Internal Medicine

## 2019-07-25 ENCOUNTER — Ambulatory Visit (INDEPENDENT_AMBULATORY_CARE_PROVIDER_SITE_OTHER): Payer: Medicare HMO | Admitting: Nurse Practitioner

## 2019-07-25 ENCOUNTER — Other Ambulatory Visit: Payer: Self-pay

## 2019-07-25 VITALS — BP 140/79 | HR 64 | Ht 63.0 in | Wt 168.0 lb

## 2019-07-25 DIAGNOSIS — F411 Generalized anxiety disorder: Secondary | ICD-10-CM

## 2019-07-25 DIAGNOSIS — F410 Panic disorder [episodic paroxysmal anxiety] without agoraphobia: Secondary | ICD-10-CM

## 2019-07-25 DIAGNOSIS — Z79899 Other long term (current) drug therapy: Secondary | ICD-10-CM

## 2019-07-25 DIAGNOSIS — R600 Localized edema: Secondary | ICD-10-CM

## 2019-07-25 DIAGNOSIS — I10 Essential (primary) hypertension: Secondary | ICD-10-CM

## 2019-07-25 MED ORDER — ALPRAZOLAM 1 MG PO TABS: 0.5000 mg | ORAL_TABLET | Freq: Three times a day (TID) | ORAL | 1 refills | Status: AC | PRN

## 2019-07-25 MED ORDER — TRIAMTERENE-HCTZ 37.5-25 MG PO TABS: 0.5000 | ORAL_TABLET | Freq: Every day | ORAL | 0 refills | Status: DC

## 2019-07-25 NOTE — Progress Notes (Signed)
Virtual Visit via Video Note  I connected with [name] on 07/25/19 at 11:00 AM EST by a video enabled telemedicine application and verified that I am speaking with the correct person using two identifiers.  Location: Patient:home Provider: office Only patient and provider present during video call I discussed the limitations of evaluation and management by telemedicine and the availability of in person appointments. The patient expressed understanding and agreed to proceed.  History of Present Illness: Anxiety: Reports increased anxiety due to recent move to [place] with fiancee 2weeks ago. Reviewed PMP database. No reg flags, last xanax refill 06/20/2019, 45tabs GAD 7 : Generalized Anxiety Score 07/25/2019 06/14/2017  Nervous, Anxious, on Edge 2 3  Control/stop worrying 2 3  Worry too much - different things 2 2  Trouble relaxing 2 -  Restless 0 2  Easily annoyed or irritable 0 0  Afraid - awful might happen 0 3  Total GAD 7 Score 8 -  Anxiety Difficulty Somewhat difficult -   Depression screen [facility] 2/9 07/25/2019 01/09/2019 01/02/2018  Decreased Interest 0 0 0  Down, Depressed, Hopeless 0 0 0  PHQ - 2 Score 0 0 0  Altered sleeping 0 - -  Tired, decreased energy 0 - -  Change in appetite 0 - -  Feeling bad or failure about yourself  0 - -  Trouble concentrating 0 - -  Moving slowly or fidgety/restless 0 - -  Suicidal thoughts 0 - -  PHQ-9 Score 0 - -  Difficult doing work/chores Not difficult at all - -  Some recent data might be hidden   Observations/Objective: Physical Exam  Constitutional: She is oriented to person, place, and time.  Cardiovascular: Normal rate.  Pulmonary/Chest: Effort normal.  Neurological: She is alert and oriented to person, place, and time.  Psychiatric: She has a normal mood and affect. Her behavior is normal. Thought content normal.  Vitals reviewed.  Assessment and Plan: Nehemiah was seen today for follow-up.  Diagnoses and all orders for  this visit:  Chronic use of benzodiazepine for therapeutic purpose  Generalized anxiety disorder with panic attacks  Essential hypertension -     triamterene-hydrochlorothiazide (MAXZIDE-25) 37.5-25 MG tablet; Take 0.5 tablets by mouth daily.  Generalized anxiety disorder -     ALPRAZolam (XANAX) 1 MG tablet; Take 0.5 tablets (0.5 mg total) by mouth every 8 (eight) hours as needed for anxiety. Needs office visit for additional refills  Bilateral leg edema -     triamterene-hydrochlorothiazide (MAXZIDE-25) 37.5-25 MG tablet; Take 0.5 tablets by mouth daily.   Follow Up Instructions: See avs   I discussed the assessment and treatment plan with the patient. The patient was provided an opportunity to ask questions and all were answered. The patient agreed with the plan and demonstrated an understanding of the instructions.   The patient was advised to call back or seek an in-person evaluation if the symptoms worsen or if the condition fails to improve as anticipated.   Wilfred Lacy, NP

## 2019-09-23 ENCOUNTER — Other Ambulatory Visit: Payer: Self-pay

## 2019-09-23 DIAGNOSIS — I10 Essential (primary) hypertension: Secondary | ICD-10-CM

## 2019-09-23 DIAGNOSIS — Z7189 Other specified counseling: Secondary | ICD-10-CM | POA: Diagnosis not present

## 2019-09-23 DIAGNOSIS — F419 Anxiety disorder, unspecified: Secondary | ICD-10-CM | POA: Diagnosis not present

## 2019-09-23 DIAGNOSIS — Z713 Dietary counseling and surveillance: Secondary | ICD-10-CM | POA: Diagnosis not present

## 2019-09-23 DIAGNOSIS — E785 Hyperlipidemia, unspecified: Secondary | ICD-10-CM | POA: Diagnosis not present

## 2019-09-23 DIAGNOSIS — K2100 Gastro-esophageal reflux disease with esophagitis, without bleeding: Secondary | ICD-10-CM | POA: Diagnosis not present

## 2019-09-23 DIAGNOSIS — Z853 Personal history of malignant neoplasm of breast: Secondary | ICD-10-CM | POA: Diagnosis not present

## 2019-09-23 DIAGNOSIS — R739 Hyperglycemia, unspecified: Secondary | ICD-10-CM | POA: Diagnosis not present

## 2019-09-23 NOTE — Telephone Encounter (Signed)
Left a message for patient to call back with the correct pharmacy.

## 2019-10-10 DIAGNOSIS — R200 Anesthesia of skin: Secondary | ICD-10-CM | POA: Diagnosis not present

## 2019-10-10 DIAGNOSIS — H60501 Unspecified acute noninfective otitis externa, right ear: Secondary | ICD-10-CM | POA: Diagnosis not present

## 2019-10-10 DIAGNOSIS — R609 Edema, unspecified: Secondary | ICD-10-CM | POA: Diagnosis not present

## 2019-10-10 DIAGNOSIS — J0100 Acute maxillary sinusitis, unspecified: Secondary | ICD-10-CM | POA: Diagnosis not present

## 2019-10-16 DIAGNOSIS — R0600 Dyspnea, unspecified: Secondary | ICD-10-CM | POA: Diagnosis not present

## 2019-10-16 DIAGNOSIS — I2510 Atherosclerotic heart disease of native coronary artery without angina pectoris: Secondary | ICD-10-CM | POA: Diagnosis not present

## 2019-10-16 DIAGNOSIS — I10 Essential (primary) hypertension: Secondary | ICD-10-CM | POA: Diagnosis not present

## 2019-10-16 DIAGNOSIS — M5381 Other specified dorsopathies, occipito-atlanto-axial region: Secondary | ICD-10-CM | POA: Diagnosis not present

## 2019-10-16 DIAGNOSIS — C50919 Malignant neoplasm of unspecified site of unspecified female breast: Secondary | ICD-10-CM | POA: Diagnosis not present

## 2019-10-16 DIAGNOSIS — R609 Edema, unspecified: Secondary | ICD-10-CM | POA: Diagnosis not present

## 2019-10-16 DIAGNOSIS — E782 Mixed hyperlipidemia: Secondary | ICD-10-CM | POA: Diagnosis not present

## 2019-10-16 DIAGNOSIS — R079 Chest pain, unspecified: Secondary | ICD-10-CM | POA: Diagnosis not present

## 2019-10-18 ENCOUNTER — Telehealth: Payer: Self-pay | Admitting: Nurse Practitioner

## 2019-10-18 DIAGNOSIS — R200 Anesthesia of skin: Secondary | ICD-10-CM | POA: Diagnosis not present

## 2019-10-18 DIAGNOSIS — R202 Paresthesia of skin: Secondary | ICD-10-CM | POA: Diagnosis not present

## 2019-10-18 NOTE — Telephone Encounter (Signed)
Pt called stating she need help from [place] put in referral to [facility] in order for them to pay for the bill that happened back in 07/2019 when she saw [facility] for sinus infections/issue.   I'm not familiar with this at all, please advise. Pt stated all we have do is send referral to [facility] but it has to come from [facility] because that's the PCP at that time.

## 2019-10-18 NOTE — Telephone Encounter (Signed)
Pt stated she moved and est with new PCP already. She will contact her new PCP for this refills.   Rx denied by Faxed.

## 2019-10-18 NOTE — Telephone Encounter (Signed)
Received refill for Pravastatin 40 mg tabs take 1 tab daily in evening from [facility] [address] in [place].   Left vm for the pt to call back, need to offer an appt with [facility], last ov was 07/2019--to follow up in 3 mo. No lipid lab since 07/2018.

## 2019-10-20 NOTE — Telephone Encounter (Signed)
What referral is she referring to? Sinus infection/issue was not addressed during November appt.

## 2019-10-21 NOTE — Telephone Encounter (Signed)
I called and spoke to patient about referral she is requesting. Patient informed me she needed a referral for urgent care back dated for 07/2019.  Patient informed that we don't do referrals to urgent care. Patient verbalized understanding.

## 2019-10-24 DIAGNOSIS — I2510 Atherosclerotic heart disease of native coronary artery without angina pectoris: Secondary | ICD-10-CM | POA: Diagnosis not present

## 2019-10-24 DIAGNOSIS — R0789 Other chest pain: Secondary | ICD-10-CM | POA: Diagnosis not present

## 2019-10-24 DIAGNOSIS — R0609 Other forms of dyspnea: Secondary | ICD-10-CM | POA: Diagnosis not present

## 2019-10-30 DIAGNOSIS — R600 Localized edema: Secondary | ICD-10-CM | POA: Diagnosis not present

## 2019-10-30 DIAGNOSIS — I10 Essential (primary) hypertension: Secondary | ICD-10-CM | POA: Diagnosis not present

## 2019-10-30 DIAGNOSIS — R0609 Other forms of dyspnea: Secondary | ICD-10-CM | POA: Diagnosis not present

## 2019-10-30 DIAGNOSIS — I2510 Atherosclerotic heart disease of native coronary artery without angina pectoris: Secondary | ICD-10-CM | POA: Diagnosis not present

## 2019-10-30 DIAGNOSIS — R0789 Other chest pain: Secondary | ICD-10-CM | POA: Diagnosis not present

## 2019-11-07 DIAGNOSIS — K5901 Slow transit constipation: Secondary | ICD-10-CM | POA: Diagnosis not present

## 2019-11-07 DIAGNOSIS — R600 Localized edema: Secondary | ICD-10-CM | POA: Diagnosis not present

## 2019-11-07 DIAGNOSIS — I10 Essential (primary) hypertension: Secondary | ICD-10-CM | POA: Diagnosis not present

## 2019-11-07 DIAGNOSIS — M545 Low back pain: Secondary | ICD-10-CM | POA: Diagnosis not present

## 2019-11-07 DIAGNOSIS — T782XXD Anaphylactic shock, unspecified, subsequent encounter: Secondary | ICD-10-CM | POA: Diagnosis not present

## 2019-11-07 DIAGNOSIS — G8929 Other chronic pain: Secondary | ICD-10-CM | POA: Diagnosis not present

## 2019-11-07 DIAGNOSIS — Z713 Dietary counseling and surveillance: Secondary | ICD-10-CM | POA: Diagnosis not present

## 2019-11-07 DIAGNOSIS — Z7189 Other specified counseling: Secondary | ICD-10-CM | POA: Diagnosis not present

## 2019-11-10 DIAGNOSIS — I10 Essential (primary) hypertension: Secondary | ICD-10-CM | POA: Diagnosis not present

## 2019-11-10 DIAGNOSIS — K5900 Constipation, unspecified: Secondary | ICD-10-CM | POA: Diagnosis not present

## 2019-11-10 DIAGNOSIS — R1031 Right lower quadrant pain: Secondary | ICD-10-CM | POA: Diagnosis not present

## 2019-11-10 DIAGNOSIS — Z853 Personal history of malignant neoplasm of breast: Secondary | ICD-10-CM | POA: Diagnosis not present

## 2019-11-10 DIAGNOSIS — R1084 Generalized abdominal pain: Secondary | ICD-10-CM | POA: Diagnosis not present

## 2019-11-10 DIAGNOSIS — R1030 Lower abdominal pain, unspecified: Secondary | ICD-10-CM | POA: Diagnosis not present

## 2019-11-10 DIAGNOSIS — R32 Unspecified urinary incontinence: Secondary | ICD-10-CM | POA: Diagnosis not present

## 2019-11-10 DIAGNOSIS — T17920A Food in respiratory tract, part unspecified causing asphyxiation, initial encounter: Secondary | ICD-10-CM | POA: Diagnosis not present

## 2019-11-10 DIAGNOSIS — R0689 Other abnormalities of breathing: Secondary | ICD-10-CM | POA: Diagnosis not present

## 2019-11-10 DIAGNOSIS — R52 Pain, unspecified: Secondary | ICD-10-CM | POA: Diagnosis not present

## 2019-11-12 DIAGNOSIS — K625 Hemorrhage of anus and rectum: Secondary | ICD-10-CM | POA: Diagnosis not present

## 2019-11-12 DIAGNOSIS — Z7189 Other specified counseling: Secondary | ICD-10-CM | POA: Diagnosis not present

## 2019-11-12 DIAGNOSIS — M5442 Lumbago with sciatica, left side: Secondary | ICD-10-CM | POA: Diagnosis not present

## 2019-11-12 DIAGNOSIS — Z713 Dietary counseling and surveillance: Secondary | ICD-10-CM | POA: Diagnosis not present

## 2019-11-18 DIAGNOSIS — K5909 Other constipation: Secondary | ICD-10-CM | POA: Diagnosis not present

## 2019-11-18 DIAGNOSIS — K625 Hemorrhage of anus and rectum: Secondary | ICD-10-CM | POA: Diagnosis not present

## 2019-11-25 DIAGNOSIS — M5417 Radiculopathy, lumbosacral region: Secondary | ICD-10-CM | POA: Diagnosis not present

## 2019-11-25 DIAGNOSIS — Z713 Dietary counseling and surveillance: Secondary | ICD-10-CM | POA: Diagnosis not present

## 2019-11-25 DIAGNOSIS — Z7189 Other specified counseling: Secondary | ICD-10-CM | POA: Diagnosis not present

## 2019-11-25 DIAGNOSIS — Z87442 Personal history of urinary calculi: Secondary | ICD-10-CM | POA: Diagnosis not present

## 2019-11-25 DIAGNOSIS — R109 Unspecified abdominal pain: Secondary | ICD-10-CM | POA: Diagnosis not present

## 2019-11-25 DIAGNOSIS — R609 Edema, unspecified: Secondary | ICD-10-CM | POA: Diagnosis not present

## 2019-11-25 DIAGNOSIS — R1310 Dysphagia, unspecified: Secondary | ICD-10-CM | POA: Diagnosis not present

## 2019-11-29 DIAGNOSIS — I10 Essential (primary) hypertension: Secondary | ICD-10-CM | POA: Diagnosis not present

## 2019-11-29 DIAGNOSIS — I2510 Atherosclerotic heart disease of native coronary artery without angina pectoris: Secondary | ICD-10-CM | POA: Diagnosis not present

## 2019-11-29 DIAGNOSIS — M545 Low back pain: Secondary | ICD-10-CM | POA: Diagnosis not present

## 2019-11-29 DIAGNOSIS — Z1211 Encounter for screening for malignant neoplasm of colon: Secondary | ICD-10-CM | POA: Diagnosis not present

## 2019-11-29 DIAGNOSIS — K5900 Constipation, unspecified: Secondary | ICD-10-CM | POA: Diagnosis not present

## 2019-11-29 DIAGNOSIS — R32 Unspecified urinary incontinence: Secondary | ICD-10-CM | POA: Diagnosis not present

## 2019-11-29 DIAGNOSIS — K625 Hemorrhage of anus and rectum: Secondary | ICD-10-CM | POA: Diagnosis not present

## 2019-11-29 DIAGNOSIS — K5730 Diverticulosis of large intestine without perforation or abscess without bleeding: Secondary | ICD-10-CM | POA: Diagnosis not present

## 2019-11-29 DIAGNOSIS — R1310 Dysphagia, unspecified: Secondary | ICD-10-CM | POA: Diagnosis not present

## 2019-12-03 DIAGNOSIS — R32 Unspecified urinary incontinence: Secondary | ICD-10-CM | POA: Diagnosis not present

## 2019-12-03 DIAGNOSIS — K5900 Constipation, unspecified: Secondary | ICD-10-CM | POA: Diagnosis not present

## 2019-12-03 DIAGNOSIS — K5730 Diverticulosis of large intestine without perforation or abscess without bleeding: Secondary | ICD-10-CM | POA: Diagnosis not present

## 2019-12-03 DIAGNOSIS — Z853 Personal history of malignant neoplasm of breast: Secondary | ICD-10-CM | POA: Diagnosis not present

## 2019-12-03 DIAGNOSIS — I2510 Atherosclerotic heart disease of native coronary artery without angina pectoris: Secondary | ICD-10-CM | POA: Diagnosis not present

## 2019-12-03 DIAGNOSIS — K625 Hemorrhage of anus and rectum: Secondary | ICD-10-CM | POA: Diagnosis not present

## 2019-12-03 DIAGNOSIS — M545 Low back pain: Secondary | ICD-10-CM | POA: Diagnosis not present

## 2019-12-03 DIAGNOSIS — Z1211 Encounter for screening for malignant neoplasm of colon: Secondary | ICD-10-CM | POA: Diagnosis not present

## 2019-12-03 DIAGNOSIS — I10 Essential (primary) hypertension: Secondary | ICD-10-CM | POA: Diagnosis not present

## 2019-12-03 DIAGNOSIS — R1310 Dysphagia, unspecified: Secondary | ICD-10-CM | POA: Diagnosis not present

## 2019-12-04 DIAGNOSIS — M5137 Other intervertebral disc degeneration, lumbosacral region: Secondary | ICD-10-CM | POA: Diagnosis not present

## 2019-12-04 DIAGNOSIS — M5116 Intervertebral disc disorders with radiculopathy, lumbar region: Secondary | ICD-10-CM | POA: Diagnosis not present

## 2019-12-04 DIAGNOSIS — M47817 Spondylosis without myelopathy or radiculopathy, lumbosacral region: Secondary | ICD-10-CM | POA: Diagnosis not present

## 2019-12-09 DIAGNOSIS — N134 Hydroureter: Secondary | ICD-10-CM | POA: Diagnosis not present

## 2019-12-16 DIAGNOSIS — Z7189 Other specified counseling: Secondary | ICD-10-CM | POA: Diagnosis not present

## 2019-12-16 DIAGNOSIS — G544 Lumbosacral root disorders, not elsewhere classified: Secondary | ICD-10-CM | POA: Diagnosis not present

## 2019-12-16 DIAGNOSIS — R200 Anesthesia of skin: Secondary | ICD-10-CM | POA: Diagnosis not present

## 2019-12-16 DIAGNOSIS — Z8679 Personal history of other diseases of the circulatory system: Secondary | ICD-10-CM | POA: Diagnosis not present

## 2019-12-16 DIAGNOSIS — Z713 Dietary counseling and surveillance: Secondary | ICD-10-CM | POA: Diagnosis not present

## 2019-12-26 DIAGNOSIS — M545 Low back pain: Secondary | ICD-10-CM | POA: Diagnosis not present

## 2019-12-31 ENCOUNTER — Other Ambulatory Visit: Payer: Self-pay

## 2019-12-31 NOTE — Telephone Encounter (Signed)
Prescription Refill Request for Pravastatin 40mg  tablets. Called patient to see about setting up for an appointment before refills and pt informed me she had moved and she was going to get a new PCP.

## 2020-01-03 DIAGNOSIS — R200 Anesthesia of skin: Secondary | ICD-10-CM | POA: Diagnosis not present

## 2020-01-06 DIAGNOSIS — R200 Anesthesia of skin: Secondary | ICD-10-CM | POA: Diagnosis not present
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
## 2020-01-15 ENCOUNTER — Ambulatory Visit: Payer: Medicare HMO | Admitting: *Deleted

## 2020-03-31 ENCOUNTER — Ambulatory Visit: Payer: Medicare PPO | Admitting: Physician Assistant

## 2020-04-01 ENCOUNTER — Telehealth: Payer: Self-pay | Admitting: Adult Health

## 2020-04-01 NOTE — Telephone Encounter (Signed)
Rescheduled appointment per 7/21 message. Patient is aware of updated appointment date and time.

## 2020-04-16 ENCOUNTER — Encounter: Payer: Self-pay | Admitting: Nurse Practitioner

## 2020-04-16 ENCOUNTER — Ambulatory Visit: Payer: Medicare PPO | Admitting: Nurse Practitioner

## 2020-04-16 VITALS — BP 136/72 | HR 86 | Ht 63.0 in | Wt 190.4 lb

## 2020-04-16 DIAGNOSIS — K5900 Constipation, unspecified: Secondary | ICD-10-CM | POA: Diagnosis not present

## 2020-04-16 DIAGNOSIS — R1084 Generalized abdominal pain: Secondary | ICD-10-CM

## 2020-04-16 DIAGNOSIS — R140 Abdominal distension (gaseous): Secondary | ICD-10-CM

## 2020-04-16 NOTE — Patient Instructions (Signed)
If you are age 67 or older, your body mass index should be between 23-30. Your Body mass index is 33.73 kg/m. If this is out of the aforementioned range listed, please consider follow up with your Primary Care Provider.  If you are age 59 or younger, your body mass index should be between 19-25. Your Body mass index is 33.73 kg/m. If this is out of the aformentioned range listed, please consider follow up with your Primary Care Provider.   Take Miralax 1 capful mixed in 8 ounces of water at bed time for constipation as tolerated.  Take a phillips probiotic 1 daily  Due to recent changes in healthcare laws, you may see the results of your imaging and laboratory studies on MyChart before your provider has had a chance to review them.  We understand that in some cases there may be results that are confusing or concerning to you. Not all laboratory results come back in the same time frame and the provider may be waiting for multiple results in order to interpret others.  Please give [place] 48 hours in order for your provider to thoroughly review all the results before contacting the office for clarification of your results.   Follow up with Dr Hilarie Fredrickson on 06/16/2020

## 2020-04-16 NOTE — Progress Notes (Addendum)
04/16/2020 [name] [id number] 1950-11-06   CHIEF COMPLAINT:  Abdominal swelling and constipation   HISTORY OF PRESENT ILLNESS: [name] is a 69 year old female with a past medical history of anxiety, asthma, CAD, breast cancer s/p right mastectomy chemo and radiation 2009 and chronic lower back pain. She presents today for further evaluation for abdominal "swelling". She reports her abdomen feels swollen, tight and hard which progressively worsens throughout the day. These symptoms started after she collapsed after leaving church 11/10/2019. She went to [facility] and she stated an abdominal/pelvic CT showed some abnormality without an obstruction but a large amount of stool was noted in the colon. She was discharged home with the instructions to take a stool softener and fiber. She underwent a colonoscopy by Dr. Jennings Books around 11/13/2019 which she reported was normal. I will request a copy of her hospital and colonoscopy records for further review. She continued to have abdominal distension and she underwent a repeat abd/pelvic CT which she reported showed stool throughout the colon. She is passing a normal medium sized dark brown stool most days but does not feel completely emptied. No weight loss. No fever, sweats or chills.  She occasionally sees a small amount of blood on the toilet tissue and on the stool. History of dysphagia. EGD in 2016 by Dr. Hilarie Fredrickson showed esophageal candidiasis which was treated with Diflucan. The esophagus was dilated to 90mm.   A repeat EGD 04/10/2018 done due to dysphagia was normal, the esophagus was dilated to 51mm. No other complaints today.   Allergies  Allergen Reactions  . Amoxicillin Anaphylaxis    Throat Swells Has patient had a PCN reaction causing immediate rash, facial/tongue/throat swelling, SOB or lightheadedness with hypotension: Yes Has patient had a PCN reaction causing severe rash involving mucus membranes or skin necrosis: No Has  patient had a PCN reaction that required hospitalization: No Has patient had a PCN reaction occurring within the last 10 years: No If all of the above answers are "NO", then may proceed with Cephalosporin use.   . Azithromycin Anaphylaxis and Other (See Comments)    Throat Swelling  . Bromfed Anaphylaxis    Throat Swelling  . Cephalexin Anaphylaxis and Other (See Comments)    Throat Swelling Unknown  . Chlordiazepoxide-Clidinium Anaphylaxis    Throat Swelling  . Claritin [Loratadine] Anaphylaxis  . Clotrimazole Swelling, Other (See Comments) and Hypertension    Patient told me that she couldn't swallow due to the medication  . Gabapentin Other (See Comments)    Nausea, weakness, lost movement and feeling in both legs (HAD TO CALL EMS)  . Gatifloxacin Shortness Of Breath and Other (See Comments)    Caused bad chest congestion and caused a severe asthma attack  . Ibuprofen Anaphylaxis  . Iohexol Anaphylaxis, Shortness Of Breath, Swelling and Other (See Comments)     Code: HIVES, Desc: throat swelling no hives 20 yrs ago;needs pre-medication  09/19/07 sg, Onset Date: [id number]   . Lidocaine Hives and Hypertension    REQUIRED A TRIP TO [place]  . Lisinopril Other (See Comments)    ANGIOEDEMA  . Other Other (See Comments)    PT IS HIGHLY ALLERGIC TO THE STICKY ELECTRODE PADS - CAUSES BLEEDING AND SKIN PEELING - LEAVES SCARS  . Paroxetine Anaphylaxis    Throat Swelling  . Penicillins Anaphylaxis and Hives    PATIENT HAS HAD A PCN REACTION WITH IMMEDIATE RASH, FACIAL/TONGUE/THROAT SWELLING, SOB, OR LIGHTHEADEDNESS WITH HYPOTENSION:  #  #  #  YES  #  #  #   Has patient had a PCN reaction causing severe rash involving mucus membranes or skin necrosis: No Has patient had a PCN reaction that required hospitalization No Has patient had a PCN reaction occurring within the last 10 years: No.   . Prednisone Anaphylaxis and Swelling    Throat swelling   . Pregabalin Anxiety, Anaphylaxis  and Other (See Comments)    nervousness nervousness  . Propoxyphene N-Acetaminophen Anaphylaxis    REACTION: swelling in the throat  . Sertraline Hcl Anaphylaxis    Throat Swelling  . Sulfa Antibiotics Anaphylaxis  . Sulfadiazine Anaphylaxis and Other (See Comments)    Throat Swelling Unknown  . Verapamil Anaphylaxis and Other (See Comments)    Throat Swelling Unknown  . Adhesive [Tape] Other (See Comments)    blisters  . Clonazepam Nausea Only and Other (See Comments)    numbness, weakness in her arms, legs and increased tremors  . Effexor [Venlafaxine] Hives and Itching  . Escitalopram Nausea Only and Other (See Comments)    LEXAPRO== Nausea, numb, tingly  . Ibuprofen Nausea And Vomiting  . Latex Swelling    Blisters on Skin  . Sertraline Other (See Comments)    Other reaction(s): Other (See Comments) Other reaction(s): Unknown  . Tussionex Pennkinetic Er [Hydrocod Polst-Cpm Polst Er] Itching and Photosensitivity    "Sunburn"  . Paroxetine Hcl Other (See Comments)  . Chlordiazepoxide-Clidinium Other (See Comments)  . Dicyclomine Hcl Hives  . Hydralazine Hcl Itching and Rash  . Pseudoephedrine Hives, Itching and Rash  . Valium [Diazepam] Itching and Nausea Only    Took in hospital and had nausea, couldn't swallow, itching       Outpatient Encounter Medications as of 04/16/2020  Medication Sig  . acetaminophen (TYLENOL) 500 MG tablet Take 500-1,000 mg by mouth every 6 (six) hours as needed for moderate pain.   Marland Kitchen ALPRAZolam (XANAX) 1 MG tablet Take 0.5 tablets (0.5 mg total) by mouth every 8 (eight) hours as needed for anxiety. Needs office visit for additional refills  . aspirin EC 81 MG tablet Take 81 mg by mouth daily.  . Calcium Carbonate-Vitamin D (CALCIUM-CARB 600 + D) 600-125 MG-UNIT TABS Take 1 tablet by mouth daily.   . cetirizine (ZYRTEC) 10 MG tablet Take 1 tablet (10 mg total) by mouth at bedtime.  . fluticasone (FLONASE) 50 MCG/ACT nasal spray   . fluticasone  (FLOVENT HFA) 110 MCG/ACT inhaler Inhale 2 puffs into the lungs 2 (two) times daily.  Marland Kitchen losartan (COZAAR) 100 MG tablet Take 1 tablet (100 mg total) by mouth daily.  . meclizine (ANTIVERT) 25 MG tablet   . metoprolol tartrate (LOPRESSOR) 100 MG tablet Take 1 tablet (100 mg total) by mouth 2 (two) times daily.  . pantoprazole (PROTONIX) 40 MG tablet TAKE 1 TABLET (40 MG TOTAL) BY MOUTH DAILY.  . pravastatin (PRAVACHOL) 40 MG tablet TAKE 1 TABLET BY MOUTH IN THE EVENING  . pregabalin (LYRICA) 75 MG capsule Take 75 mg by mouth 3 (three) times daily.  . RESTASIS 0.05 % ophthalmic emulsion Place 1 drop into both eyes 2 (two) times daily.  Marland Kitchen triamterene-hydrochlorothiazide (MAXZIDE-25) 37.5-25 MG tablet Take 0.5 tablets by mouth daily.  . valACYclovir (VALTREX) 500 MG tablet TK 1 T PO BID FOR 3 DAYS  . VENTOLIN HFA 108 (90 Base) MCG/ACT inhaler Inhale 2 puffs into the lungs every 6 (six) hours as needed for wheezing or shortness of breath.  . vitamin C (ASCORBIC ACID)  500 MG tablet Take 500 mg by mouth daily.  Marland Kitchen VITAMIN E PO Take by mouth.  . [DISCONTINUED] amLODipine (NORVASC) 5 MG tablet TAKE 1 AND 1/2 TABLET DAILY   No facility-administered encounter medications on file as of 04/16/2020.     REVIEW OF SYSTEMS: All other systems reviewed and negative except where noted in the History of Present Illness.   PHYSICAL EXAM: BP 136/72   Pulse 86   Ht 5\' 3"  (1.6 m)   Wt 190 lb 6.4 oz (86.4 kg)   BMI 33.73 kg/m    Wt Readings from Last 3 Encounters:  04/16/20 190 lb 6.4 oz (86.4 kg)  07/25/19 168 lb (76.2 kg)  06/20/19 179 lb 14.4 oz (81.6 kg)   General: Well developed 69 year old female in no acute distress. Head: Normocephalic and atraumatic. Eyes:  Sclerae non-icteric, conjunctive pink. Ears: Normal auditory acuity. Mouth: Dentition intact. No ulcers or lesions.  Neck: Supple, no lymphadenopathy or thyromegaly.  Lungs: Clear bilaterally to auscultation without wheezes, crackles or  rhonchi. Heart: Regular rate and rhythm. No murmur, rub or gallop appreciated.  Abdomen: Soft, but mildly distended. Nontender. No masses. No hepatosplenomegaly. Normoactive bowel sounds x 4 quadrants.  Rectal: Deferred  Musculoskeletal: Symmetrical with no gross deformities. Skin: Warm and dry. No rash or lesions on visible extremities. Extremities: No edema. Neurological: Alert oriented x 4, no focal deficits.  Psychological:  Alert and cooperative. Normal mood and affect.  ASSESSMENT AND PLAN:  1. Abdominal bloat/distension most likely due to constipation.  -Request copy of CTAP x 2 and colonoscopy report 11/2019 -Miralax Q HS -If no improvement will prescribe Linzess  -Phillip's bacteria probiotic once daily  -Follow up in the office in 8 weeks   2. History of dysphagia, resolved   3. History of breast cancer   Addendum: EGD colonoscopy 12/03/2019 from [facility] received. Note records document patient's name as [name] DOB 1951/07/09. EGD was normal. Colonoscopy showed mild diverticulosis to the transverse colon otherwise was normal. No polyps.     CC:  No ref. provider found

## 2020-04-21 ENCOUNTER — Other Ambulatory Visit: Payer: Self-pay | Admitting: Nurse Practitioner

## 2020-04-21 DIAGNOSIS — Z1231 Encounter for screening mammogram for malignant neoplasm of breast: Secondary | ICD-10-CM

## 2020-05-04 NOTE — Progress Notes (Signed)
Addendum: Reviewed and agree with assessment and management plan. Latravis Grine M, MD  

## 2020-05-21 ENCOUNTER — Ambulatory Visit: Payer: Medicare PPO | Admitting: Cardiology

## 2020-06-16 ENCOUNTER — Ambulatory Visit (INDEPENDENT_AMBULATORY_CARE_PROVIDER_SITE_OTHER): Payer: Medicare PPO | Admitting: Internal Medicine

## 2020-06-16 ENCOUNTER — Encounter: Payer: Self-pay | Admitting: Internal Medicine

## 2020-06-16 VITALS — BP 152/94 | HR 88 | Ht 63.0 in | Wt 190.0 lb

## 2020-06-16 DIAGNOSIS — K581 Irritable bowel syndrome with constipation: Secondary | ICD-10-CM | POA: Diagnosis not present

## 2020-06-16 DIAGNOSIS — K219 Gastro-esophageal reflux disease without esophagitis: Secondary | ICD-10-CM

## 2020-06-16 MED ORDER — POLYETHYLENE GLYCOL 3350 17 GM/SCOOP PO POWD: 1.0000 | Freq: Every day | ORAL | 0 refills | Status: DC

## 2020-06-16 MED ORDER — PANTOPRAZOLE SODIUM 40 MG PO TBEC: 40.0000 mg | DELAYED_RELEASE_TABLET | ORAL | 0 refills | Status: DC

## 2020-06-16 MED ORDER — FAMOTIDINE 20 MG PO TABS: 20.0000 mg | ORAL_TABLET | Freq: Every evening | ORAL | 0 refills | Status: DC

## 2020-06-16 NOTE — Patient Instructions (Signed)
We have sent the following medications to your pharmacy for you to pick up at your convenience: Pantoprazole- Take 1 capsule by mouth every morning 30 minutes before breakfast (change from previous way of taking)  Famotidine 20 mg every evening  Please purchase the following medications over the counter and take as directed: Miralax 17 grams (1 capful) dissolved in at least 8 ounces water/juice once EVERY DAY.  Please follow the low gas diet which we have given you to look over at your visit today.  We have given you samples of the following medication to take: IBGard--- If this works well for you, you may purchase this over the counter.  Please follow up with Dr Hilarie Fredrickson in 3 months.  If you are age 13 or older, your body mass index should be between 23-30. Your Body mass index is 33.66 kg/m. If this is out of the aforementioned range listed, please consider follow up with your Primary Care Provider.  If you are age 57 or younger, your body mass index should be between 19-25. Your Body mass index is 33.66 kg/m. If this is out of the aformentioned range listed, please consider follow up with your Primary Care Provider.   Due to recent changes in healthcare laws, you may see the results of your imaging and laboratory studies on MyChart before your provider has had a chance to review them.  We understand that in some cases there may be results that are confusing or concerning to you. Not all laboratory results come back in the same time frame and the provider may be waiting for multiple results in order to interpret others.  Please give [place] 48 hours in order for your provider to thoroughly review all the results before contacting the office for clarification of your results.

## 2020-06-16 NOTE — Progress Notes (Signed)
   Subjective:    Patient ID: [name], female    DOB: [birth year], 69 y.o.   MRN: [id number]  HPI [name] is a 69 year old female with a past medical history of GERD, constipation, colonic diverticulosis, history of breast cancer, CAD, chronic lower back pain who is here for follow-up.  She was last seen on 04/16/2020 by Carl Best, NP.  She is here today with her husband.  She reports that she continues to have issues with constipation associated with abdominal bloating and discomfort.  MiraLAX is definitively helped with her bowel movements and also has improved pain and bloating however she is not using this daily.  There is no recent blood in her stool or melena.  She is having some ongoing issues with her heartburn, GERD and belching.  This is worse after eating.  She is taking pantoprazole 40 mg daily.  If she skips or misses a dose or reflux is significantly worse.  She did have severe constipation and what sounds like fecal impaction symptoms which led to upper endoscopy and colonoscopy performed in [place] in March 2021.  These documents have not yet been scanned but an addendum was made to her last office visit.  EGD was normal.  Colonoscopy showed diverticulosis to the transverse colon was otherwise normal.  No polyps.   Review of Systems As per HPI, otherwise negative  Current Medications, Allergies, Past Medical History, Past Surgical History, Family History and Social History were reviewed in [employer] record.     Objective:   Physical Exam BP (!) 152/94   Pulse 88   Ht 5\' 3"  (1.6 m)   Wt 190 lb (86.2 kg)   BMI 33.66 kg/m  Gen: awake, alert, NAD HEENT: anicteric CV: RRR, no mrg Pulm: CTA b/l Abd: soft, tenderness in the epigastrium and mid abdomen without rebound or guarding, obese, ND, +BS throughout Ext: no c/c/e Neuro: nonfocal     Assessment & Plan:  68 year old female with a past medical history of GERD,  constipation, colonic diverticulosis, history of breast cancer, CAD, chronic lower back pain who is here for follow-up.  1.  IBS with constipation --we discussed this diagnosis today.  I have recommended that she be scheduled with MiraLAX on a daily basis.  Improving bowel movement should improve abdominal pain and bloating symptom.  If not we could try Linzess.  Also trial of IBgard, samples provided --Schedule MiraLAX 17 g daily as opposed to intermittently --IBgard per box instruction as needed --Low gas diet, copy provided  2.  GERD --breakthrough symptoms despite once daily pantoprazole.  She has been using this in the evening. --Move pantoprazole to 40 mg 30 minutes before breakfast --Add famotidine 20 mg in the evening  3.  CRC screening --normal colonoscopy March 2021, repeat March 2031  34-month follow-up, sooner if needed  30 minutes total spent today including patient facing time, coordination of care, reviewing medical history/procedures/pertinent radiology studies, and documentation of the encounter.

## 2020-06-29 ENCOUNTER — Ambulatory Visit
Admission: RE | Admit: 2020-06-29 | Discharge: 2020-06-29 | Disposition: A | Discharge status: Home / Self Care | Payer: Medicare PPO | Source: Ambulatory Visit | Attending: Nurse Practitioner | Admitting: Nurse Practitioner

## 2020-06-29 ENCOUNTER — Other Ambulatory Visit: Payer: Self-pay

## 2020-06-29 DIAGNOSIS — Z1231 Encounter for screening mammogram for malignant neoplasm of breast: Secondary | ICD-10-CM

## 2020-07-02 ENCOUNTER — Other Ambulatory Visit: Payer: Self-pay | Admitting: Nurse Practitioner

## 2020-07-02 DIAGNOSIS — R928 Other abnormal and inconclusive findings on diagnostic imaging of breast: Secondary | ICD-10-CM

## 2020-07-07 ENCOUNTER — Encounter: Payer: Medicare HMO | Admitting: Adult Health

## 2020-07-09 ENCOUNTER — Encounter: Payer: Medicare PPO | Admitting: Adult Health

## 2020-07-17 ENCOUNTER — Ambulatory Visit: Payer: Medicare PPO

## 2020-07-17 ENCOUNTER — Ambulatory Visit
Admission: RE | Admit: 2020-07-17 | Discharge: 2020-07-17 | Disposition: A | Discharge status: Home / Self Care | Payer: Medicare PPO | Source: Ambulatory Visit | Attending: Nurse Practitioner | Admitting: Nurse Practitioner

## 2020-07-17 ENCOUNTER — Other Ambulatory Visit: Payer: Self-pay

## 2020-07-17 DIAGNOSIS — R928 Other abnormal and inconclusive findings on diagnostic imaging of breast: Secondary | ICD-10-CM

## 2020-07-22 ENCOUNTER — Encounter: Payer: Self-pay | Admitting: Adult Health

## 2020-07-22 ENCOUNTER — Telehealth: Payer: Self-pay | Admitting: Adult Health

## 2020-07-22 ENCOUNTER — Inpatient Hospital Stay: Payer: Medicare PPO | Attending: Adult Health | Admitting: Adult Health

## 2020-07-22 ENCOUNTER — Other Ambulatory Visit: Payer: Self-pay

## 2020-07-22 VITALS — BP 154/80 | HR 88 | Temp 97.7°F | Resp 18 | Ht 63.0 in | Wt 192.2 lb

## 2020-07-22 DIAGNOSIS — Z853 Personal history of malignant neoplasm of breast: Secondary | ICD-10-CM | POA: Insufficient documentation

## 2020-07-22 DIAGNOSIS — Z9221 Personal history of antineoplastic chemotherapy: Secondary | ICD-10-CM | POA: Diagnosis not present

## 2020-07-22 DIAGNOSIS — Z170 Estrogen receptor positive status [ER+]: Secondary | ICD-10-CM | POA: Diagnosis not present

## 2020-07-22 DIAGNOSIS — Z923 Personal history of irradiation: Secondary | ICD-10-CM | POA: Insufficient documentation

## 2020-07-22 DIAGNOSIS — M8580 Other specified disorders of bone density and structure, unspecified site: Secondary | ICD-10-CM | POA: Insufficient documentation

## 2020-07-22 DIAGNOSIS — C50211 Malignant neoplasm of upper-inner quadrant of right female breast: Secondary | ICD-10-CM | POA: Diagnosis not present

## 2020-07-22 DIAGNOSIS — Z87891 Personal history of nicotine dependence: Secondary | ICD-10-CM | POA: Diagnosis not present

## 2020-07-22 NOTE — Telephone Encounter (Signed)
Scheduled appointment 11/10 los. Patient aware of appointment date and time.
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
## 2020-08-20 ENCOUNTER — Ambulatory Visit: Payer: Medicare PPO | Admitting: Nurse Practitioner

## 2020-10-08 ENCOUNTER — Other Ambulatory Visit (INDEPENDENT_AMBULATORY_CARE_PROVIDER_SITE_OTHER): Payer: Medicare PPO

## 2020-10-08 ENCOUNTER — Encounter: Payer: Self-pay | Admitting: Nurse Practitioner

## 2020-10-08 ENCOUNTER — Ambulatory Visit (INDEPENDENT_AMBULATORY_CARE_PROVIDER_SITE_OTHER): Payer: Medicare PPO | Admitting: Nurse Practitioner

## 2020-10-08 VITALS — BP 114/68 | HR 68 | Ht 63.0 in | Wt 195.0 lb

## 2020-10-08 DIAGNOSIS — R1032 Left lower quadrant pain: Secondary | ICD-10-CM | POA: Diagnosis not present

## 2020-10-08 DIAGNOSIS — K5900 Constipation, unspecified: Secondary | ICD-10-CM

## 2020-10-08 LAB — COMPREHENSIVE METABOLIC PANEL
ALT: 24 U/L (ref 0–35)
AST: 20 U/L (ref 0–37)
Albumin: 4.2 g/dL (ref 3.5–5.2)
Alkaline Phosphatase: 53 U/L (ref 39–117)
BUN: 21 mg/dL (ref 6–23)
CO2: 34 mEq/L — ABNORMAL HIGH (ref 19–32)
Calcium: 10.5 mg/dL (ref 8.4–10.5)
Chloride: 101 mEq/L (ref 96–112)
Creatinine, Ser: 0.8 mg/dL (ref 0.40–1.20)
GFR: 76.22 mL/min (ref >60.00)
Glucose, Bld: 100 mg/dL — ABNORMAL HIGH (ref 70–99)
Potassium: 3 mEq/L — ABNORMAL LOW (ref 3.5–5.1)
Sodium: 139 mEq/L (ref 135–145)
Total Bilirubin: 0.8 mg/dL (ref 0.2–1.2)
Total Protein: 7 g/dL (ref 6.0–8.3)

## 2020-10-08 LAB — CBC WITH DIFFERENTIAL/PLATELET
Basophils Absolute: 0 10*3/uL (ref 0.0–0.1)
Basophils Relative: 1.1 % (ref 0.0–3.0)
Eosinophils Absolute: 0.1 10*3/uL (ref 0.0–0.7)
Eosinophils Relative: 2.2 % (ref 0.0–5.0)
HCT: 37.3 % (ref 36.0–46.0)
Hemoglobin: 12.6 g/dL (ref 12.0–15.0)
Lymphocytes Relative: 38.6 % (ref 12.0–46.0)
Lymphs Abs: 1.6 10*3/uL (ref 0.7–4.0)
MCHC: 33.8 g/dL (ref 30.0–36.0)
MCV: 89.3 fl (ref 78.0–100.0)
Monocytes Absolute: 0.4 10*3/uL (ref 0.1–1.0)
Monocytes Relative: 10 % (ref 3.0–12.0)
Neutro Abs: 2.1 10*3/uL (ref 1.4–7.7)
Neutrophils Relative %: 48.1 % (ref 43.0–77.0)
Platelets: 178 10*3/uL (ref 150.0–400.0)
RBC: 4.17 Mil/uL (ref 3.87–5.11)
RDW: 14.1 % (ref 11.5–15.5)
WBC: 4.3 10*3/uL (ref 4.0–10.5)

## 2020-10-08 NOTE — Progress Notes (Signed)
     10/08/2020 [name] [id number] Aug 12, 1951   Chief Complaint: Follow up constipation   History of Present Illness: [name] is a 70 year old female with a past medical history of anxiety, asthma, CAD, breast cancer s/p right mastectomy chemo and radiation 2009 and chronic lower back pain. She presents to our office today for constipation follow up. She was last seen by Dr. Hilarie Fredrickson on 06/16/2020, at that time her constipation was fairly well controlled on Miralax but she also had some abdominal bloat. She was prescribed IBgard and her bloat improved. She was also having increased reflux symptoms and belching. Pantoprazole 40mg  daily was continued and Famotidine 20mg  one tab in the evening was added. Currently, she reports feeling quite well. She is passing a normal formed bowel movement most days, overall feels emptied. However, her stools were a little softer, not quite loose over the past few days and she briefly had LLQ discomfort yesterday which abated. No current abdominal pain. No heartburn or significant belching.  EGD colonoscopy 12/03/2019 from [facility] received.EGD was normal. Colonoscopy showed mild diverticulosis to the transverse colon otherwise was normal. No polyps.  Current Medications, Allergies, Past Medical History, Past Surgical History, Family History and Social History were reviewed in [employer] record.   Review of Systems:   Constitutional: Negative for fever, sweats, chills or weight loss.  Respiratory: Negative for shortness of breath.   Cardiovascular: Negative for chest pain, palpitations and leg swelling.  Gastrointestinal: See HPI.  Musculoskeletal: Negative for back pain or muscle aches.  Neurological: Negative for dizziness, headaches or paresthesias.    Physical Exam: BP 114/68 (BP Location: Left Arm, Patient Position: Sitting, Cuff Size: Normal)   Pulse 68   Ht 5\' 3"  (1.6 m)   Wt 195 lb (88.5 kg)   BMI 34.54 kg/m   General: Well developed 70 year old female in no acute distress. Head: Normocephalic and atraumatic. Eyes: No scleral icterus. Conjunctiva pink . Ears: Normal auditory acuity. Lungs: Clear throughout to auscultation. Heart: Regular rate and rhythm, no murmur. Abdomen: Soft, nontender and nondistended. No masses or hepatomegaly. Normal bowel sounds x 4 quadrants.  Rectal: Deferred.  Musculoskeletal: Symmetrical with no gross deformities. Extremities: No edema. Neurological: Alert oriented x 4. No focal deficits.  Psychological: Alert and cooperative. Normal mood and affect  Assessment and Recommendations: 1.  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 Abdominal bloat mostly resolved after taking Ibgard. Mild LLQ pain yesterday, resolved. Softer BMs past 2 days.  -Hold Mirlax today -If stools are firmer tomorrow restart Miralax 1/2 dose x 1 day then continue 1/2 to 1 capfull daily as tolerated -Patient will call our office if LLQ recurs or if diarrhea deveops -CBC, CMP -Follow up in office in 3 to 4 months and as needed   2.  GERD, stable -Continue Pantoprazole 40mg  QD and Famotidine 20mg  Q pm  3.  CRC screening, normal colonoscopy March 2021, repeat March 2031  4. Numerous medication allergies

## 2020-10-08 NOTE — Patient Instructions (Addendum)
LABS:   Your provider has requested that you go to the basement level for lab work before leaving today. Press "B" on the elevator. The lab is located at the first door on the left as you exit the elevator.  HEALTHCARE LAWS AND MY CHART RESULTS: Due to recent changes in healthcare laws, you may see the results of your imaging and laboratory studies on MyChart before your provider has had a chance to review them.   We understand that in some cases there may be results that are confusing or concerning to you. Not all laboratory results come back in the same time frame and the provider may be waiting for multiple results in order to interpret others.  Please give [place] 48 hours in order for your provider to thoroughly review all the results before contacting the office for clarification of your results.   As discussed, do not use Miralax today. You may restart 1/2 capful of Miralax tomorrow then 1/2 to 1 capful daily as tolerated.  OVER THE COUNTER MEDICATION  Please purchase the following medications over the counter and take as directed:  Hardin Negus Bacteria Probiotic or IBgard twice a day as needed for gas. We have provided you with some samples of IBgard.  Follow up with Dr. Hilarie Fredrickson in 4 months.

## 2020-10-15 NOTE — Progress Notes (Signed)
Addendum: Reviewed and agree with assessment and management plan. Belvie Iribe M, MD  

## 2020-10-19 ENCOUNTER — Ambulatory Visit (INDEPENDENT_AMBULATORY_CARE_PROVIDER_SITE_OTHER): Payer: Medicare HMO

## 2020-10-19 ENCOUNTER — Encounter: Payer: Self-pay | Admitting: Podiatry

## 2020-10-19 ENCOUNTER — Other Ambulatory Visit: Payer: Self-pay

## 2020-10-19 ENCOUNTER — Ambulatory Visit: Payer: Medicare HMO | Admitting: Podiatry

## 2020-10-19 DIAGNOSIS — Z853 Personal history of malignant neoplasm of breast: Secondary | ICD-10-CM | POA: Insufficient documentation

## 2020-10-19 DIAGNOSIS — J4520 Mild intermittent asthma, uncomplicated: Secondary | ICD-10-CM | POA: Insufficient documentation

## 2020-10-19 DIAGNOSIS — M722 Plantar fascial fibromatosis: Secondary | ICD-10-CM

## 2020-10-19 DIAGNOSIS — Z8639 Personal history of other endocrine, nutritional and metabolic disease: Secondary | ICD-10-CM | POA: Insufficient documentation

## 2020-10-19 DIAGNOSIS — M7731 Calcaneal spur, right foot: Secondary | ICD-10-CM

## 2020-10-19 DIAGNOSIS — K5900 Constipation, unspecified: Secondary | ICD-10-CM | POA: Insufficient documentation

## 2020-10-19 DIAGNOSIS — I252 Old myocardial infarction: Secondary | ICD-10-CM | POA: Insufficient documentation

## 2020-10-19 DIAGNOSIS — M5430 Sciatica, unspecified side: Secondary | ICD-10-CM | POA: Insufficient documentation

## 2020-10-19 DIAGNOSIS — E119 Type 2 diabetes mellitus without complications: Secondary | ICD-10-CM | POA: Insufficient documentation

## 2020-10-19 NOTE — Patient Instructions (Signed)

## 2020-10-23 NOTE — Progress Notes (Signed)
Subjective: 70 year old female presents the office today for evaluation of RIGHT foot pain, heel pain.  She states it has been getting worse over the last 3 months has become more consistent mostly the bottom of her heel.  She does not report any injury or falls when this started.  She said no recent treatment.  Left foot is been doing well. Denies any systemic complaints such as fevers, chills, nausea, vomiting. No acute changes since last appointment, and no other complaints at this time.   Objective: AAO x3, NAD DP/PT pulses palpable bilaterally, CRT less than 3 seconds Tenderness to palpation along the plantar medial tubercle of the calcaneus at the insertion of plantar fascia on the right foot. There is no pain along the course of the plantar fascia within the arch of the foot. Plantar fascia appears to be intact. There is no pain with lateral compression of the calcaneus or pain with vibratory sensation. There is mild discomfort on the distal portion of the Achilles tendon however overall the tendon appears to be intact. No other areas of tenderness to bilateral lower extremities.  Negative Tinel's sign.  MMT 5/5 No pain with calf compression, swelling, warmth, erythema  Assessment: Right heel pain, plantar fasciitis with small heel spur  Plan: -All treatment options discussed with the patient including all alternatives, risks, complications.  -Dermatology to be held off on steroid injection or anti-inflammatories.  Plantar fascial strapping was applied today.  Continue with stretching, icing daily.  Also recommended to replace the power step inserts.  Consider other treatment options including physical therapy, EPAT. -Patient encouraged to call the office with any questions, concerns, change in symptoms.   Trula Slade DPM

## 2020-11-19 ENCOUNTER — Other Ambulatory Visit: Payer: Self-pay

## 2020-11-19 ENCOUNTER — Ambulatory Visit: Payer: Medicare HMO | Admitting: Podiatry

## 2020-11-19 DIAGNOSIS — M629 Disorder of muscle, unspecified: Secondary | ICD-10-CM | POA: Diagnosis not present

## 2020-11-19 DIAGNOSIS — M722 Plantar fascial fibromatosis: Secondary | ICD-10-CM | POA: Diagnosis not present

## 2020-11-19 DIAGNOSIS — G8929 Other chronic pain: Secondary | ICD-10-CM

## 2020-11-19 DIAGNOSIS — M79671 Pain in right foot: Secondary | ICD-10-CM | POA: Diagnosis not present

## 2020-11-24 NOTE — Progress Notes (Signed)
Subjective: 70 year old female presents the office today for evaluation of right heel pain.  She states that she is continuing to have discomfort the bottom of her heel.  She states that she also does get some discomfort. Also states that she gets discomfort in the middle the night to the bottom of her heel. She also has back issues and she is following up with somebody else for this.  No recent injury or falls or changes otherwise since I last saw her. Denies any systemic complaints such as fevers, chills, nausea, vomiting. No acute changes since last appointment, and no other complaints at this time.   Objective: AAO x3, NAD DP/PT pulses palpable bilaterally, CRT less than 3 seconds There is tenderness palpation of the plantar medial tubercle of the calcaneus at the insertion of plantar fascia the plantar fascia appears to be intact.  There is no pain with lateral compression of calcaneus.  No pain the Achilles tendon.  Negative Tinel's sign.  MMT 5/5.  No pain with calf compression, swelling, warmth, erythema  Assessment: Chronic heel pain right side  Plan: -All treatment options discussed with the patient including all alternatives, risks, complications.  -We discussed the conservative as well as surgical treatment options.  Would order an MRI to rule out plantar fascial strain versus other pathology.  For now discussed with night splint but she is in a brace over-the-counter and also dispensed cam boot for immobilization today. -Some of her symptoms could also be nerve related and I would like her to also follow-up with her back surgeon for this as well. -Patient encouraged to call the office with any questions, concerns, change in symptoms.   Trula Slade DPM

## 2020-12-04 ENCOUNTER — Ambulatory Visit
Admission: RE | Admit: 2020-12-04 | Discharge: 2020-12-04 | Disposition: A | Discharge status: Home / Self Care | Payer: Medicare HMO | Source: Ambulatory Visit | Attending: Podiatry | Admitting: Podiatry

## 2020-12-04 DIAGNOSIS — M629 Disorder of muscle, unspecified: Secondary | ICD-10-CM

## 2021-01-05 ENCOUNTER — Ambulatory Visit: Payer: Medicare HMO | Admitting: Podiatry

## 2021-01-05 ENCOUNTER — Other Ambulatory Visit: Payer: Self-pay

## 2021-01-05 DIAGNOSIS — M722 Plantar fascial fibromatosis: Secondary | ICD-10-CM | POA: Diagnosis not present

## 2021-01-05 NOTE — Patient Instructions (Signed)
I have ordered physical therapy for [facility]. If you do not hear for them about scheduling within the next 1 week, or you have any questions please give [place] a call at [phone].

## 2021-01-08 NOTE — Progress Notes (Signed)
Subjective: 70 year old female presents the office today for follow-up evaluation of right heel pain.  She states that her symptoms are mostly unchanged.   She is follow-up with a new back doctor about possibly having surgery in June.  Denies any systemic complaints such as fevers, chills, nausea, vomiting. No acute changes since last appointment, and no other complaints at this time.   MRI 12/06/2020 IMPRESSION: 1. Severe plantar fasciitis of the medial band of the plantar fascia at the calcaneal insertion with a small partial-thickness tear and subcortical marrow edema. 2. Mild tendinosis of the peroneus brevis with a longitudinal split tear distal to the lateral malleolus.  Objective: AAO x3, NAD DP/PT pulses palpable bilaterally, CRT less than 3 seconds She still been tender to palpation on plantar medial tubercle of the calcaneus at the insertion of plantar fashion.  The plantar fascia appears intact.  Slight discomfort of the ankle along the course of the peroneal tendon.  Tendon appears to be intact.  No significant pain to the flexor tendons.  MMT 5/5.  No pain with calf compression, swelling, warmth, erythema  Assessment: Heel pain, plantar fasciitis right side with partial tear plantar fascia, peroneal partial tear  Plan: -All treatment options discussed with the patient including all alternatives, risks, complications.  -Reviewed the MRI.  A long discussion regards with conservative as well as surgical options.  After discussion her and her husband agreed to proceed with conservative treatment.  We will go and start start physical therapy.  [facility] for [facility].  She has multiple other issues going on so would hold off on surgery for now.  Discussed other treatment options including EPAT, PRP.  -Patient encouraged to call the office with any questions, concerns, change in symptoms.   Trula Slade DPM

## 2021-02-16 ENCOUNTER — Ambulatory Visit: Payer: Medicare HMO | Admitting: Podiatry

## 2021-02-23 ENCOUNTER — Encounter: Payer: Self-pay | Admitting: Podiatry

## 2021-02-23 ENCOUNTER — Ambulatory Visit: Payer: Medicare HMO | Admitting: Podiatry

## 2021-02-23 ENCOUNTER — Other Ambulatory Visit: Payer: Self-pay

## 2021-02-23 DIAGNOSIS — M722 Plantar fascial fibromatosis: Secondary | ICD-10-CM

## 2021-02-23 DIAGNOSIS — M79671 Pain in right foot: Secondary | ICD-10-CM

## 2021-02-23 DIAGNOSIS — M629 Disorder of muscle, unspecified: Secondary | ICD-10-CM | POA: Diagnosis not present

## 2021-02-23 DIAGNOSIS — M24849 Other specific joint derangements of unspecified hand, not elsewhere classified: Secondary | ICD-10-CM | POA: Insufficient documentation

## 2021-02-23 DIAGNOSIS — G8929 Other chronic pain: Secondary | ICD-10-CM | POA: Diagnosis not present
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
## 2021-03-04 ENCOUNTER — Telehealth: Payer: Self-pay | Admitting: Gastroenterology

## 2021-03-04 ENCOUNTER — Telehealth: Payer: Self-pay | Admitting: Internal Medicine

## 2021-03-04 ENCOUNTER — Encounter: Payer: Self-pay | Admitting: Gastroenterology

## 2021-03-04 ENCOUNTER — Ambulatory Visit (INDEPENDENT_AMBULATORY_CARE_PROVIDER_SITE_OTHER): Payer: Medicare HMO | Admitting: Gastroenterology

## 2021-03-04 VITALS — BP 132/86 | HR 82 | Ht 63.0 in | Wt 195.4 lb

## 2021-03-04 DIAGNOSIS — M542 Cervicalgia: Secondary | ICD-10-CM | POA: Diagnosis not present

## 2021-03-04 DIAGNOSIS — R1310 Dysphagia, unspecified: Secondary | ICD-10-CM | POA: Diagnosis not present

## 2021-03-04 NOTE — Telephone Encounter (Signed)
Inbound call from pt requesting a call back stating that she can not get her CT done on [address] due to Radiology telling her that she needed to go to [facility] because of her allergy. Please advise. Thanks.

## 2021-03-04 NOTE — Telephone Encounter (Signed)
Error on provider: Carl Best-  Patient called states she has been really sick for about 3 days now and is having a really hard time swallowing. She said its painful and is at the point that she can not eat anything sipping on some tea this morningseeking advise.

## 2021-03-04 NOTE — Telephone Encounter (Signed)
DOD Patient of Dr Vena Rua seen last by Carl Best , NP who is also unavailable. Spoke with the patient. She had an allergic reaction last week to medications (Macrobid or pregablin).By the ER note, she was complaining of throat burning then as well. She was given Doxycycline to resume treatment of her UTI. She started experiencing painful swallowing and sharp epigastric pain 3 days after starting the Doxycycline. Everything she swallows causes this pain. She is on Pantoprazole every AM and famotidine every PM. She assures me she always takes the Doxycycline with food and never on an empty stomach. Please advise. The schedule is booked for 4 weeks out.

## 2021-03-04 NOTE — Telephone Encounter (Signed)
Please refer to message below. This will affect the PA. Please let me know if the PA can be altered to reflect request.

## 2021-03-04 NOTE — Telephone Encounter (Signed)
I would be happy to see her this morning at 11:10. Thank you.

## 2021-03-04 NOTE — Patient Instructions (Addendum)
It was my pleasure to provide care to you you today. Based on our discussion, I am providing you with my recommendations below:  RECOMMENDATION(S):   CT SCAN:  You have been scheduled for a CT of the Neck at [address] 1st floor on 03/08/21 at 1020am. Please arrive @ 10am for registration.   PREP:   Do not eat anything after 600am (4 hours prior to your test)   CONTRAST:  You will ONLY receive IV contrast for this exam  MEDICATIONS:  You may take your medications as prescribed with a small amount of water, if necessary.   NEED TO RESCHEDULE?   Please call radiology at [phone].  ENDOSCOPY:   You have been scheduled for an endoscopy. Please follow written instructions given to you at your visit today.  INHALERS:   If you use inhalers (even only as needed), please bring them with you on the day of your procedure.  FOLLOW UP:  After your procedure, you will receive a call from my office staff regarding my recommendation for follow up.  BMI:  If you are age 19 or older, your body mass index should be between 23-30. Your There is no height or weight on file to calculate BMI. If this is out of the aforementioned range listed, please consider follow up with your Primary Care Provider.  MY CHART:  The [facility] providers would like to encourage you to use [facility] to communicate with providers for non-urgent requests or questions.  Due to long hold times on the telephone, sending your provider a message by [facility] may be a faster and more efficient way to get a response.  Please allow 48 business hours for a response.  Please remember that this is for non-urgent requests.   Thank you for trusting me with your gastrointestinal care!    Thornton Park, MD, MPH

## 2021-03-04 NOTE — Telephone Encounter (Signed)
Pt was seen by Dr. Tarri Glenn today, please see note below.
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
## 2021-03-04 NOTE — Telephone Encounter (Signed)
Patient is coming in from [place].  Agrees to this plan.

## 2021-03-05 ENCOUNTER — Other Ambulatory Visit: Payer: Self-pay

## 2021-03-05 ENCOUNTER — Ambulatory Visit (AMBULATORY_SURGERY_CENTER): Payer: Medicare HMO | Admitting: Gastroenterology

## 2021-03-05 ENCOUNTER — Encounter: Payer: Self-pay | Admitting: Gastroenterology

## 2021-03-05 VITALS — BP 149/80 | HR 69 | Temp 98.2°F | Resp 18 | Ht 63.0 in | Wt 195.0 lb

## 2021-03-05 DIAGNOSIS — K319 Disease of stomach and duodenum, unspecified: Secondary | ICD-10-CM

## 2021-03-05 DIAGNOSIS — H9201 Otalgia, right ear: Secondary | ICD-10-CM

## 2021-03-05 DIAGNOSIS — K317 Polyp of stomach and duodenum: Secondary | ICD-10-CM

## 2021-03-05 DIAGNOSIS — K2970 Gastritis, unspecified, without bleeding: Secondary | ICD-10-CM | POA: Diagnosis not present

## 2021-03-05 DIAGNOSIS — K2210 Ulcer of esophagus without bleeding: Secondary | ICD-10-CM

## 2021-03-05 DIAGNOSIS — R1032 Left lower quadrant pain: Secondary | ICD-10-CM

## 2021-03-05 DIAGNOSIS — K219 Gastro-esophageal reflux disease without esophagitis: Secondary | ICD-10-CM

## 2021-03-05 MED ORDER — SUCRALFATE 1 GM/10ML PO SUSP: 1.0000 g | Freq: Four times a day (QID) | ORAL | 1 refills | Status: DC

## 2021-03-05 MED ORDER — SODIUM CHLORIDE 0.9 % IV SOLN: 500.0000 mL | Freq: Once | INTRAVENOUS | Status: AC

## 2021-03-05 MED ORDER — PANTOPRAZOLE SODIUM 40 MG PO TBEC: 40.0000 mg | DELAYED_RELEASE_TABLET | Freq: Two times a day (BID) | ORAL | 3 refills | Status: DC

## 2021-03-05 NOTE — Progress Notes (Signed)
pt tolerated well. VSS. awake and to recovery. Report given to RN.  Bit block left insitu to recovery. No truma.

## 2021-03-05 NOTE — Progress Notes (Signed)
Called to room to assist during endoscopic procedure.  Patient ID and intended procedure confirmed with present staff. Received instructions for my participation in the procedure from the performing physician.  

## 2021-03-05 NOTE — Patient Instructions (Signed)
YOU HAD AN ENDOSCOPIC PROCEDURE TODAY AT THE [facility]:   Refer to the procedure report that was given to you for any specific questions about what was found during the examination.  If the procedure report does not answer your questions, please call your gastroenterologist to clarify.  If you requested that your care partner not be given the details of your procedure findings, then the procedure report has been included in a sealed envelope for you to review at your convenience later.  YOU SHOULD EXPECT: Some feelings of bloating in the abdomen. Passage of more gas than usual.  Walking can help get rid of the air that was put into your GI tract during the procedure and reduce the bloating. If you had a lower endoscopy (such as a colonoscopy or flexible sigmoidoscopy) you may notice spotting of blood in your stool or on the toilet paper. If you underwent a bowel prep for your procedure, you may not have a normal bowel movement for a few days.  Please Note:  You might notice some irritation and congestion in your nose or some drainage.  This is from the oxygen used during your procedure.  There is no need for concern and it should clear up in a day or so.  SYMPTOMS TO REPORT IMMEDIATELY:    Following upper endoscopy (EGD)  Vomiting of blood or coffee ground material  New chest pain or pain under the shoulder blades  Painful or persistently difficult swallowing  New shortness of breath  Fever of 100F or higher  Black, tarry-looking stools  For urgent or emergent issues, a gastroenterologist can be reached at any hour by calling [phone]. Do not use MyChart messaging for urgent concerns.    DIET:  We do recommend a small meal at first, but then you may proceed to your regular diet.  Drink plenty of fluids but you should avoid alcoholic beverages for 24 hours.  ACTIVITY:  You should plan to take it easy for the rest of today and you should NOT DRIVE or use heavy machinery  until tomorrow (because of the sedation medicines used during the test).    FOLLOW UP: Our staff will call the number listed on your records 48-72 hours following your procedure to check on you and address any questions or concerns that you may have regarding the information given to you following your procedure. If we do not reach you, we will leave a message.  We will attempt to reach you two times.  During this call, we will ask if you have developed any symptoms of COVID 19. If you develop any symptoms (ie: fever, flu-like symptoms, shortness of breath, cough etc.) before then, please call [phone].  If you test positive for Covid 19 in the 2 weeks post procedure, please call and report this information to us.    If any biopsies were taken you will be contacted by phone or by letter within the next 1-3 weeks.  Please call us at [phone] if you have not heard about the biopsies in 3 weeks.    SIGNATURES/CONFIDENTIALITY: You and/or your care partner have signed paperwork which will be entered into your electronic medical record.  These signatures attest to the fact that that the information above on your After Visit Summary has been reviewed and is understood.  Full responsibility of the confidentiality of this discharge information lies with you and/or your care-partner. 

## 2021-03-05 NOTE — Telephone Encounter (Signed)
Auth# [id number] good 03/05/21-09/01/21 good for [facility].  Patient can be scheduled there.

## 2021-03-05 NOTE — Op Note (Addendum)
[facility] Patient Name: [name] Procedure Date: 03/05/2021 7:25 AM MRN: [id number] Endoscopist: Thornton Park MD, MD Age: 70 Referring MD:  Date of Birth: 1951/07/10 Gender: Female Account #: [id number] Procedure:                Upper GI endoscopy Indications:              Dysphagia, right ear and right neck pain Medicines:                Monitored Anesthesia Care Procedure:                Pre-Anesthesia Assessment:                           - Prior to the procedure, a History and Physical                            was performed, and patient medications and                            allergies were reviewed. The patient's tolerance of                            previous anesthesia was also reviewed. The risks                            and benefits of the procedure and the sedation                            options and risks were discussed with the patient.                            All questions were answered, and informed consent                            was obtained. Prior Anticoagulants: The patient has                            taken no previous anticoagulant or antiplatelet                            agents. ASA Grade Assessment: III - A patient with                            severe systemic disease. After reviewing the risks                            and benefits, the patient was deemed in                            satisfactory condition to undergo the procedure.                           After obtaining informed consent, the endoscope was  passed under direct vision. Throughout the                            procedure, the patient's blood pressure, pulse, and                            oxygen saturations were monitored continuously. The                            GIF [id number] #[id number] was introduced through the                            mouth, and advanced to the third part of duodenum.                            The upper GI  endoscopy was accomplished without                            difficulty. The patient tolerated the procedure                            well. Scope In: Scope Out: Findings:                 Two medium to large cratered esophageal ulcers with                            no bleeding and no stigmata of recent bleeding were                            found 30 cm from the incisors. The margins were not                            heaped. Biopsies were taken with a cold forceps for                            histology. Estimated blood loss was minimal.                           The Z-line was regular and was found 35 cm from the                            incisors. Biopsies were taken with a cold forceps                            for histology. Estimated blood loss was minimal.                           Patchy mildly erythematous mucosa without bleeding                            was found in the gastric body. Biopsies were taken  from the antrum, body, and fundus with a cold                            forceps for histology. Estimated blood loss was                            minimal.                           Multiple small sessile polyps were found in the                            gastric fundus. Biopsies were taken with a cold                            forceps for histology. Estimated blood loss was                            minimal.                           The examined duodenum was normal. Complications:            No immediate complications. Estimated blood loss:                            Minimal. Estimated Blood Loss:     Estimated blood loss was minimal. Impression:               - Esophageal ulcers with no bleeding and no                            stigmata of recent bleeding. Biopsied.                           - Z-line regular, 35 cm from the incisors. Biopsied.                           - Erythematous mucosa in the gastric body. Biopsied.                            - Multiple gastric polyps. Biopsied.                           - Normal examined duodenum. Recommendation:           - Patient has a contact number available for                            emergencies. The signs and symptoms of potential                            delayed complications were discussed with the                            patient. Return to normal activities tomorrow.  Written discharge instructions were provided to the                            patient.                           - Resume previous diet.                           - Continue present medications. Increase                            pantoprazole to 40 mg BID for 12 weeks. Start                            Carafate 1g slurry QID x 2 weeks.                           - Await pathology results.                           - We have initiated a referral to ENT given the                            right neck and right ear pain.                           - Outpatient follow-up with Dr. Hilarie Fredrickson when these                            results are available. Thornton Park MD, MD 03/05/2021 7:58:24 AM This report has been signed electronically.

## 2021-03-08 ENCOUNTER — Inpatient Hospital Stay: Admission: RE | Admit: 2021-03-08 | Payer: Medicare HMO | Source: Ambulatory Visit

## 2021-03-09 ENCOUNTER — Telehealth: Payer: Self-pay | Admitting: *Deleted

## 2021-03-09 ENCOUNTER — Encounter: Payer: Self-pay | Admitting: Gastroenterology

## 2021-03-09 NOTE — Telephone Encounter (Signed)
  Follow up Call-  Call back number 03/05/2021  Post procedure Call Back phone  # [phone]  Permission to leave phone message Yes  Some recent data might be hidden     Patient questions:  Do you have a fever, pain , or abdominal swelling? No. Pain Score  0 *  Have you tolerated food without any problems? Yes.  - pt continues to experience some dysphagia which was her reason for having an EGD. She reports that "it feels like it is getting a little better on the right side." She is able to eat and drink. She reports she is taking the medications prescribed to treat the esophageal ulcers found on her exam. Pt instructed to call back if her swallowing worsens. Pt agreeable to plan of care. She is to have an appointment with Dr. Hilarie Fredrickson to discuss results when they are available.    Have you been able to return to your normal activities? Yes.    Do you have any questions about your discharge instructions: Diet   No. Medications  No. Follow up visit  No.  Do you have questions or concerns about your Care? No.  Actions: * If pain score is 4 or above: No action needed, pain <4.  Have you developed a fever since your procedure? no  2.   Have you had an respiratory symptoms (SOB or cough) since your procedure? no  3.   Have you tested positive for COVID 19 since your procedure no  4.   Have you had any family members/close contacts diagnosed with the COVID 19 since your procedure?  no   If yes to any of these questions please route to Joylene John, RN and Joella Prince, RN

## 2021-03-09 NOTE — Telephone Encounter (Signed)
Mailbox full unable to leave message.

## 2021-03-10 ENCOUNTER — Telehealth: Payer: Self-pay

## 2021-03-10 NOTE — Telephone Encounter (Signed)
Per Dr. Tarri Glenn "Faythe Ghee. I think it would be best for her to see ENT first. We worked on the referral last week. Hopefully she has an appointment pending. Should hold off on CT until then. Thanks!"   Spoke with patient and she is aware that we are cancelling CT scan for tomorrow. She will keep ENT appt as scheduled for 04/13/21. Patient verbalized understanding and had no concerns at the end of the call.

## 2021-03-10 NOTE — Telephone Encounter (Signed)
Received a message from [place] in radiology. Pt will need 13 hour prep sent to pharmacy due to contrast allergy. Dr. Tarri Glenn is aware. Patient has been notified and will come by the office today to pick up instructions. She has Benadryl at home and has requested that I send in Prednisone to [facility] in [place]. Patient verbalized understanding of all information and had no concerns at the end of the call.  Patient has several listed allergies to include prednisone, will discuss with Dr. Tarri Glenn.

## 2021-03-11 ENCOUNTER — Ambulatory Visit (HOSPITAL_COMMUNITY): Payer: Medicare HMO

## 2021-04-13 ENCOUNTER — Other Ambulatory Visit: Payer: Self-pay

## 2021-04-13 ENCOUNTER — Other Ambulatory Visit: Payer: Self-pay | Admitting: Gastroenterology

## 2021-04-13 ENCOUNTER — Ambulatory Visit (INDEPENDENT_AMBULATORY_CARE_PROVIDER_SITE_OTHER): Payer: Medicare HMO | Admitting: Otolaryngology

## 2021-04-13 DIAGNOSIS — H6123 Impacted cerumen, bilateral: Secondary | ICD-10-CM | POA: Diagnosis not present

## 2021-04-13 DIAGNOSIS — T162XXA Foreign body in left ear, initial encounter: Secondary | ICD-10-CM
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
## 2021-04-20 ENCOUNTER — Telehealth: Payer: Self-pay | Admitting: Gastroenterology

## 2021-04-20 NOTE — Telephone Encounter (Signed)
Pt states she is still having some problems/discomfort with her throat. Pt requested to see Dr. Hilarie Fredrickson. Pt scheduled to see Dr. Hilarie Fredrickson 06/15/21 at 9:50am. Pt aware of appt.

## 2021-04-23 ENCOUNTER — Encounter: Payer: Self-pay | Admitting: *Deleted

## 2021-04-26 ENCOUNTER — Ambulatory Visit: Payer: Medicare HMO | Admitting: Podiatry

## 2021-04-26 ENCOUNTER — Encounter: Payer: Self-pay | Admitting: Podiatry

## 2021-04-26 ENCOUNTER — Other Ambulatory Visit: Payer: Self-pay

## 2021-04-26 DIAGNOSIS — I739 Peripheral vascular disease, unspecified: Secondary | ICD-10-CM

## 2021-04-26 DIAGNOSIS — M722 Plantar fascial fibromatosis: Secondary | ICD-10-CM | POA: Diagnosis not present

## 2021-04-26 DIAGNOSIS — S96911S Strain of unspecified muscle and tendon at ankle and foot level, right foot, sequela: Secondary | ICD-10-CM

## 2021-04-29 NOTE — Progress Notes (Signed)
Subjective: 70 year old female presents the office today for follow-up evaluation of right heel pain.  She states the pain has been terrible and she wants to discuss surgical intervention.  She was going to physical therapy but she has had some issues not have to go consistently.  She is being scheduled for spinal cord stimulator trial.  Also notes she is noted some purple to blue discoloration to her toes.   MRI 12/06/2020 IMPRESSION: 1. Severe plantar fasciitis of the medial band of the plantar fascia at the calcaneal insertion with a small partial-thickness tear and subcortical marrow edema. 2. Mild tendinosis of the peroneus brevis with a longitudinal split tear distal to the lateral malleolus.  Objective: AAO x3, NAD DP/PT pulses palpable bilaterally, CRT less than 3 seconds Bilateral chronic lower extremity edema.  No erythema or warmth or any ulcerations. There is continuation of tenderness to palpation on plantar medial tubercle of the calcaneus at the insertion of plantar fascia.  The plantar fascia appears intact.  Slight discomfort of the ankle along the course of the peroneal tendon.  Tendon appears to be intact.  No significant pain to the flexor tendons.  MMT 5/5.  No pain with calf compression, swelling, warmth, erythema  Assessment: Heel pain, plantar fasciitis right side with partial tear plantar fascia, peroneal partial tear  Plan: -All treatment options discussed with the patient including all alternatives, risks, complications.  -We discussed both conservative as well as surgical treatment options.  Discussed with her endoscopic plantar fascial use, peroneal tendon tear repair, PRP application however prior to surgery on acute arterial studies and also I like for her to undergo the back spinal cord stimulator implant.  We will touch base after both of these are done.  In the meantime continue with supportive shoe gear and rehab exercises.  Trula Slade DPM

## 2021-05-03 ENCOUNTER — Ambulatory Visit (HOSPITAL_COMMUNITY)
Admission: RE | Admit: 2021-05-03 | Discharge: 2021-05-03 | Disposition: A | Discharge status: Home / Self Care | Payer: Medicare HMO | Source: Ambulatory Visit | Attending: Podiatry | Admitting: Podiatry

## 2021-05-03 DIAGNOSIS — I739 Peripheral vascular disease, unspecified: Secondary | ICD-10-CM | POA: Diagnosis not present

## 2021-05-04 ENCOUNTER — Telehealth: Payer: Self-pay | Admitting: *Deleted

## 2021-05-04 ENCOUNTER — Other Ambulatory Visit: Payer: Self-pay

## 2021-05-04 NOTE — Telephone Encounter (Signed)
Called and spoke with the patient and relayed the message per Dr Jacqualyn Posey. Lattie Haw

## 2021-05-04 NOTE — Telephone Encounter (Signed)
-----   Message from Trula Slade, DPM sent at 05/04/2021  1:23 PM EDT ----- Lattie Haw- can you please let her know that the circulation test was normal.

## 2021-05-05 ENCOUNTER — Other Ambulatory Visit: Payer: Self-pay | Admitting: Gastroenterology

## 2021-05-18 ENCOUNTER — Other Ambulatory Visit: Payer: Self-pay | Admitting: Nurse Practitioner

## 2021-05-18 ENCOUNTER — Ambulatory Visit: Payer: Medicare HMO | Admitting: Physician Assistant

## 2021-05-18 DIAGNOSIS — Z1231 Encounter for screening mammogram for malignant neoplasm of breast: Secondary | ICD-10-CM

## 2021-06-15 ENCOUNTER — Ambulatory Visit: Payer: Medicare HMO | Admitting: Internal Medicine

## 2021-06-15 ENCOUNTER — Encounter: Payer: Self-pay | Admitting: Internal Medicine

## 2021-06-15 VITALS — BP 150/90 | HR 78 | Ht 63.0 in | Wt 184.0 lb

## 2021-06-15 DIAGNOSIS — K2100 Gastro-esophageal reflux disease with esophagitis, without bleeding: Secondary | ICD-10-CM

## 2021-06-15 DIAGNOSIS — R490 Dysphonia: Secondary | ICD-10-CM | POA: Diagnosis not present

## 2021-06-15 DIAGNOSIS — R1010 Upper abdominal pain, unspecified: Secondary | ICD-10-CM

## 2021-06-15 DIAGNOSIS — K589 Irritable bowel syndrome without diarrhea: Secondary | ICD-10-CM
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
## 2021-06-15 NOTE — Patient Instructions (Signed)
Stop: Famotidine ( Pepcid)   Continue using Carafate as needed.   Stay on Pantoprazole 40mg - twice daily.   You have been scheduled for an endoscopy. Please follow written instructions given to you at your visit today. If you use inhalers (even only as needed), please bring them with you on the day of your procedure.  If you are age 68 or older, your body mass index should be between 23-30. Your Body mass index is 32.59 kg/m. If this is out of the aforementioned range listed, please consider follow up with your Primary Care Provider.  __________________________________________________________  The [facility] providers would like to encourage you to use [facility] to communicate with providers for non-urgent requests or questions.  Due to long hold times on the telephone, sending your provider a message by [facility] may be a faster and more efficient way to get a response.  Please allow 48 business hours for a response.  Please remember that this is for non-urgent requests.   Thank you for choosing me and [facility].  Dr. Ulice Dash Pyrtle

## 2021-07-01 ENCOUNTER — Ambulatory Visit
Admission: RE | Admit: 2021-07-01 | Discharge: 2021-07-01 | Disposition: A | Discharge status: Home / Self Care | Payer: Medicare HMO | Source: Ambulatory Visit | Attending: Nurse Practitioner | Admitting: Nurse Practitioner

## 2021-07-01 ENCOUNTER — Other Ambulatory Visit: Payer: Self-pay

## 2021-07-01 DIAGNOSIS — Z1231 Encounter for screening mammogram for malignant neoplasm of breast: Secondary | ICD-10-CM

## 2021-07-15 ENCOUNTER — Telehealth: Payer: Self-pay | Admitting: *Deleted
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
## 2021-07-16 ENCOUNTER — Encounter: Payer: Self-pay | Admitting: *Deleted

## 2021-07-16 ENCOUNTER — Other Ambulatory Visit: Payer: Self-pay | Admitting: Internal Medicine

## 2021-07-16 DIAGNOSIS — K2100 Gastro-esophageal reflux disease with esophagitis, without bleeding: Secondary | ICD-10-CM

## 2021-07-16 DIAGNOSIS — K2210 Ulcer of esophagus without bleeding: Secondary | ICD-10-CM

## 2021-07-16 NOTE — Telephone Encounter (Signed)
I have spoken to patient to advise of anesthesiology recommendation to complete procedure at hospital. She has been scheduled at [facility] endoscopy on 07/26/21 at 11:15 am. She is to arrive at 9:45 am. Patient verbalizes understanding of this change and is in agreement with plan. I have sent updated instructions to her MyChart as well as her home address.

## 2021-07-20 ENCOUNTER — Encounter: Payer: Medicare HMO | Admitting: Internal Medicine

## 2021-07-21 ENCOUNTER — Inpatient Hospital Stay: Payer: Medicare HMO | Attending: Adult Health | Admitting: Adult Health

## 2021-07-21 ENCOUNTER — Other Ambulatory Visit: Payer: Self-pay

## 2021-07-21 ENCOUNTER — Encounter: Payer: Self-pay | Admitting: Adult Health

## 2021-07-21 ENCOUNTER — Encounter: Payer: Medicare PPO | Admitting: Adult Health

## 2021-07-21 VITALS — BP 146/73 | HR 71 | Temp 97.8°F | Resp 18 | Ht 63.0 in | Wt 185.3 lb

## 2021-07-21 DIAGNOSIS — Z923 Personal history of irradiation: Secondary | ICD-10-CM | POA: Diagnosis not present

## 2021-07-21 DIAGNOSIS — M8580 Other specified disorders of bone density and structure, unspecified site: Secondary | ICD-10-CM | POA: Diagnosis not present

## 2021-07-21 DIAGNOSIS — Z170 Estrogen receptor positive status [ER+]: Secondary | ICD-10-CM | POA: Insufficient documentation

## 2021-07-21 DIAGNOSIS — Z853 Personal history of malignant neoplasm of breast: Secondary | ICD-10-CM | POA: Insufficient documentation

## 2021-07-21 DIAGNOSIS — C50211 Malignant neoplasm of upper-inner quadrant of right female breast: Secondary | ICD-10-CM | POA: Diagnosis not present

## 2021-07-21 DIAGNOSIS — Z9221 Personal history of antineoplastic chemotherapy: Secondary | ICD-10-CM | POA: Insufficient documentation
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
## 2021-07-21 NOTE — Addendum Note (Signed)
Addended by: Al Pimple on: 07/21/2021 12:05 PM   Modules accepted: Orders

## 2021-07-26 ENCOUNTER — Encounter (HOSPITAL_COMMUNITY)
Admission: RE | Disposition: A | Discharge status: Home / Self Care | Payer: Self-pay | Source: Home / Self Care | Attending: Internal Medicine

## 2021-07-26 ENCOUNTER — Ambulatory Visit (HOSPITAL_COMMUNITY)
Admission: RE | Admit: 2021-07-26 | Discharge: 2021-07-26 | Disposition: A | Discharge status: Home / Self Care | Payer: Medicare HMO | Attending: Internal Medicine | Admitting: Internal Medicine

## 2021-07-26 ENCOUNTER — Other Ambulatory Visit: Payer: Self-pay

## 2021-07-26 ENCOUNTER — Ambulatory Visit (HOSPITAL_COMMUNITY): Payer: Medicare HMO | Admitting: Certified Registered Nurse Anesthetist

## 2021-07-26 ENCOUNTER — Encounter (HOSPITAL_COMMUNITY): Payer: Self-pay | Admitting: Internal Medicine

## 2021-07-26 DIAGNOSIS — K2100 Gastro-esophageal reflux disease with esophagitis, without bleeding: Secondary | ICD-10-CM | POA: Insufficient documentation

## 2021-07-26 DIAGNOSIS — Z6832 Body mass index (BMI) 32.0-32.9, adult: Secondary | ICD-10-CM | POA: Insufficient documentation

## 2021-07-26 DIAGNOSIS — K219 Gastro-esophageal reflux disease without esophagitis: Secondary | ICD-10-CM

## 2021-07-26 DIAGNOSIS — K317 Polyp of stomach and duodenum: Secondary | ICD-10-CM

## 2021-07-26 DIAGNOSIS — Z87891 Personal history of nicotine dependence: Secondary | ICD-10-CM | POA: Diagnosis not present

## 2021-07-26 DIAGNOSIS — I5030 Unspecified diastolic (congestive) heart failure: Secondary | ICD-10-CM | POA: Diagnosis not present

## 2021-07-26 DIAGNOSIS — K2210 Ulcer of esophagus without bleeding: Secondary | ICD-10-CM

## 2021-07-26 DIAGNOSIS — I110 Hypertensive heart disease with heart failure: Secondary | ICD-10-CM | POA: Diagnosis not present

## 2021-07-26 DIAGNOSIS — E669 Obesity, unspecified: Secondary | ICD-10-CM | POA: Insufficient documentation

## 2021-07-26 HISTORY — PX: ESOPHAGOGASTRODUODENOSCOPY (EGD) WITH PROPOFOL: SHX5813

## 2021-07-26 LAB — GLUCOSE, CAPILLARY: Glucose-Capillary: 93 mg/dL (ref 70–99)

## 2021-07-26 SURGERY — ESOPHAGOGASTRODUODENOSCOPY (EGD) WITH PROPOFOL
Anesthesia: Monitor Anesthesia Care

## 2021-07-26 MED ORDER — MIDAZOLAM HCL 2 MG/2ML IJ SOLN
INTRAMUSCULAR | Status: AC
  Filled 2021-07-26: qty 2

## 2021-07-26 MED ORDER — MIDAZOLAM HCL 2 MG/2ML IJ SOLN
INTRAMUSCULAR | Status: DC | PRN
  Administered 2021-07-26: 2 mg via INTRAVENOUS

## 2021-07-26 MED ORDER — LACTATED RINGERS IV SOLN: INTRAVENOUS | Status: DC

## 2021-07-26 MED ORDER — PROPOFOL 10 MG/ML IV BOLUS
INTRAVENOUS | Status: DC | PRN
  Administered 2021-07-26: 30 mg via INTRAVENOUS

## 2021-07-26 MED ORDER — ONDANSETRON HCL 4 MG/2ML IJ SOLN
INTRAMUSCULAR | Status: DC | PRN
  Administered 2021-07-26: 4 mg via INTRAVENOUS

## 2021-07-26 MED ORDER — PROPOFOL 500 MG/50ML IV EMUL
INTRAVENOUS | Status: DC | PRN
  Administered 2021-07-26: 125 ug/kg/min via INTRAVENOUS

## 2021-07-26 MED ORDER — SODIUM CHLORIDE 0.9 % IV SOLN: INTRAVENOUS | Status: DC

## 2021-07-26 MED ORDER — LIDOCAINE 2% (20 MG/ML) 5 ML SYRINGE
INTRAMUSCULAR | Status: DC | PRN
  Administered 2021-07-26: 50 mg via INTRAVENOUS

## 2021-07-26 SURGICAL SUPPLY — 14 items

## 2021-07-26 NOTE — Anesthesia Procedure Notes (Signed)
Procedure Name: MAC Date/Time: 07/26/2021 10:50 AM Performed by: Claudia Desanctis, CRNA Pre-anesthesia Checklist: Patient identified, Emergency Drugs available, Suction available and Patient being monitored Patient Re-evaluated:Patient Re-evaluated prior to induction Oxygen Delivery Method: Simple face mask

## 2021-07-26 NOTE — Transfer of Care (Signed)
Immediate Anesthesia Transfer of Care Note  Patient: [name]  Procedure(s) Performed: ESOPHAGOGASTRODUODENOSCOPY (EGD) WITH PROPOFOL  Patient Location: PACU  Anesthesia Type:MAC  Level of Consciousness: drowsy and responds to stimulation  Airway & Oxygen Therapy: Patient Spontanous Breathing and Patient connected to face mask oxygen  Post-op Assessment: Report given to RN and Post -op Vital signs reviewed and stable  Post vital signs: Reviewed and stable  Last Vitals:  Vitals Value Taken Time  BP 123/53 07/26/21 1110  Temp    Pulse 65 07/26/21 1111  Resp 11 07/26/21 1111  SpO2 100 % 07/26/21 1111  Vitals shown include unvalidated device data.  Last Pain:  Vitals:   07/26/21 0956  TempSrc: Oral  PainSc: 0-No pain         Complications: No notable events documented.

## 2021-07-26 NOTE — Anesthesia Postprocedure Evaluation (Signed)
Anesthesia Post Note  Patient: [name]  Procedure(s) Performed: ESOPHAGOGASTRODUODENOSCOPY (EGD) WITH PROPOFOL     Patient location during evaluation: PACU Anesthesia Type: MAC Level of consciousness: awake and alert Pain management: pain level controlled Vital Signs Assessment: post-procedure vital signs reviewed and stable Respiratory status: spontaneous breathing, nonlabored ventilation, respiratory function stable and patient connected to nasal cannula oxygen Cardiovascular status: stable and blood pressure returned to baseline Postop Assessment: no apparent nausea or vomiting Anesthetic complications: no   No notable events documented.  Last Vitals:  Vitals:   07/26/21 1127 07/26/21 1131  BP: 123/65 (!) 144/71  Pulse: 67 68  Resp: 16 16  Temp:    SpO2: 94% 91%    Last Pain:  Vitals:   07/26/21 1110  TempSrc: Axillary  PainSc: 0-No pain                 [name]
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
## 2021-07-26 NOTE — Anesthesia Preprocedure Evaluation (Addendum)
Anesthesia Evaluation  Patient identified by MRN, date of birth, ID band Patient awake    Reviewed: Allergy & Precautions, NPO status , Patient's Chart, lab work & pertinent test results  Airway Mallampati: II  TM Distance: >3 FB Neck ROM: Full    Dental  (+) Partial Upper, Partial Lower   Pulmonary asthma (mild, controlled) , former smoker,    Pulmonary exam normal breath sounds clear to auscultation       Cardiovascular hypertension, Pt. on medications and Pt. on home beta blockers + CAD  Cardiac stents: x2, 2005.  Normal cardiovascular exam Rhythm:Regular Rate:Normal  Echo 05/04/16:  Left ventricle: The cavity size was normal. There was moderateconcentric hypertrophy. Systolic function was normal. Theestimated ejection fraction was in the range of 60% to 65%. Wallmotion was normal; there were no regional wall motionabnormalities. Doppler parameters are consistent with abnormalleft ventricular relaxation (grade 1 diastolic dysfunction).     Neuro/Psych PSYCHIATRIC DISORDERS Anxiety Depression Spinal stenosis, lumbar region, without neurogenic claudication TIA   GI/Hepatic Neg liver ROS, hiatal hernia, GERD  Medicated,Irritable bowel syndrome   Endo/Other  negative endocrine ROS  Renal/GU negative Renal ROS     Musculoskeletal Chronic back pain   Abdominal (+) + obese,   Peds  Hematology HLD   Anesthesia Other Findings Stenosis  Reproductive/Obstetrics                            Anesthesia Physical  Anesthesia Plan  ASA: 3  Anesthesia Plan: MAC   Post-op Pain Management:    Induction: Intravenous  PONV Risk Score and Plan: 3 and Ondansetron, Dexamethasone, Midazolam and Treatment may vary due to age or medical condition  Airway Management Planned:   Additional Equipment: None  Intra-op Plan:   Post-operative Plan:   Informed Consent: I have reviewed the patients History  and Physical, chart, labs and discussed the procedure including the risks, benefits and alternatives for the proposed anesthesia with the patient or authorized representative who has indicated his/her understanding and acceptance.     Dental advisory given  Plan Discussed with: CRNA and Anesthesiologist  Anesthesia Plan Comments:         Anesthesia Quick Evaluation

## 2021-07-26 NOTE — Op Note (Signed)
[facility] Patient Name: [name] Procedure Date: 07/26/2021 MRN: [id number] Attending MD: Jerene Bears , MD Date of Birth: 05/01/1951 CSN: [id number] Age: 70 Admit Type: Outpatient Procedure:                Upper GI endoscopy Indications:              Gastro-esophageal reflux disease, follow-up of                            esophagitis seen in June 2022 at EGD Providers:                Lajuan Lines. Hilarie Fredrickson, MD, Mariana Arn, [place],                            Technician, [place], Christell Faith, CRNA Referring MD:             Gildardo Pounds, FNP Medicines:                Monitored Anesthesia Care Complications:            No immediate complications. Estimated Blood Loss:     Estimated blood loss: none. Procedure:                Pre-Anesthesia Assessment:                           - Prior to the procedure, a History and Physical                            was performed, and patient medications and                            allergies were reviewed. The patient's tolerance of                            previous anesthesia was also reviewed. The risks                            and benefits of the procedure and the sedation                            options and risks were discussed with the patient.                            All questions were answered, and informed consent                            was obtained. Prior Anticoagulants: The patient has                            taken no previous anticoagulant or antiplatelet                            agents. ASA Grade Assessment: III - A patient with  severe systemic disease. After reviewing the risks                            and benefits, the patient was deemed in                            satisfactory condition to undergo the procedure.                           After obtaining informed consent, the endoscope was                            passed under direct vision. Throughout  the                            procedure, the patient's blood pressure, pulse, and                            oxygen saturations were monitored continuously. The                            GIF-H190 ([id number]) Olympus endoscope was introduced                            through the mouth, and advanced to the second part                            of duodenum. The upper GI endoscopy was                            accomplished without difficulty. The patient                            tolerated the procedure well. Scope In: Scope Out: Findings:      The examined esophagus was normal. Previously seen esophageal       ulcers/esophagitis has healed.      A few diminutive sessile polyps were found in the gastric fundus. These       are benign appearing and were biopsied at last EGD.      The exam of the stomach was otherwise normal including on retroflexed       views.      The examined duodenum was normal. Impression:               - Normal esophagus. Esophageal ulcerations have                            healed completely.                           - A few benign small gastric polyps.                           - Normal examined duodenum.                           -  No specimens collected. Moderate Sedation:      N/A Recommendation:           - Patient has a contact number available for                            emergencies. The signs and symptoms of potential                            delayed complications were discussed with the                            patient. Return to normal activities tomorrow.                            Written discharge instructions were provided to the                            patient.                           - Resume previous diet.                           - Continue present medications.                           - We can consider CT abd/pelvis is abdominal pain                            persists (but will wait to hear back from the                             patient).                           - She can follow-up in the office in 3-6 months or                            as needed. Procedure Code(s):        --- Professional ---                           [phone], Esophagogastroduodenoscopy, flexible,                            transoral; diagnostic, including collection of                            specimen(s) by brushing or washing, when performed                            (separate procedure) Diagnosis Code(s):        --- Professional ---                           K31.7, Polyp of stomach and duodenum  K21.9, Gastro-esophageal reflux disease without                            esophagitis CPT copyright 2019 American Medical Association. All rights reserved. The codes documented in this report are preliminary and upon coder review may  be revised to meet current compliance requirements. Jerene Bears, MD 07/26/2021 11:11:49 AM This report has been signed electronically. Number of Addenda: 0

## 2021-07-26 NOTE — Discharge Instructions (Signed)

## 2021-07-28 ENCOUNTER — Encounter (HOSPITAL_COMMUNITY): Payer: Self-pay | Admitting: Internal Medicine

## 2021-09-16 ENCOUNTER — Other Ambulatory Visit: Payer: Self-pay | Admitting: Gastroenterology

## 2021-10-26 ENCOUNTER — Other Ambulatory Visit (INDEPENDENT_AMBULATORY_CARE_PROVIDER_SITE_OTHER): Payer: Medicare HMO

## 2021-10-26 ENCOUNTER — Encounter: Payer: Self-pay | Admitting: Internal Medicine

## 2021-10-26 ENCOUNTER — Ambulatory Visit: Payer: Medicare HMO | Admitting: Internal Medicine

## 2021-10-26 VITALS — BP 120/70 | HR 76 | Ht 63.0 in | Wt 179.0 lb

## 2021-10-26 DIAGNOSIS — R140 Abdominal distension (gaseous): Secondary | ICD-10-CM

## 2021-10-26 DIAGNOSIS — K2100 Gastro-esophageal reflux disease with esophagitis, without bleeding: Secondary | ICD-10-CM

## 2021-10-26 DIAGNOSIS — K589 Irritable bowel syndrome without diarrhea: Secondary | ICD-10-CM | POA: Diagnosis not present

## 2021-10-26 DIAGNOSIS — R109 Unspecified abdominal pain: Secondary | ICD-10-CM | POA: Diagnosis not present

## 2021-10-26 LAB — CBC WITH DIFFERENTIAL/PLATELET
Basophils Absolute: 0 10*3/uL (ref 0.0–0.1)
Basophils Relative: 1.1 % (ref 0.0–3.0)
Eosinophils Absolute: 0.1 10*3/uL (ref 0.0–0.7)
Eosinophils Relative: 2 % (ref 0.0–5.0)
HCT: 37.5 % (ref 36.0–46.0)
Hemoglobin: 12.5 g/dL (ref 12.0–15.0)
Lymphocytes Relative: 33.3 % (ref 12.0–46.0)
Lymphs Abs: 1.5 10*3/uL (ref 0.7–4.0)
MCHC: 33.4 g/dL (ref 30.0–36.0)
MCV: 88.3 fl (ref 78.0–100.0)
Monocytes Absolute: 0.4 10*3/uL (ref 0.1–1.0)
Monocytes Relative: 8.2 % (ref 3.0–12.0)
Neutro Abs: 2.5 10*3/uL (ref 1.4–7.7)
Neutrophils Relative %: 55.4 % (ref 43.0–77.0)
Platelets: 198 10*3/uL (ref 150.0–400.0)
RBC: 4.25 Mil/uL (ref 3.87–5.11)
RDW: 14.2 % (ref 11.5–15.5)
WBC: 4.6 10*3/uL (ref 4.0–10.5)

## 2021-10-26 LAB — TSH: TSH: 1.12 u[IU]/mL (ref 0.35–5.50)

## 2021-10-26 MED ORDER — HYOSCYAMINE SULFATE SL 0.125 MG SL SUBL: 1.0000 | SUBLINGUAL_TABLET | Freq: Four times a day (QID) | SUBLINGUAL | 1 refills | Status: DC | PRN

## 2021-10-26 NOTE — Progress Notes (Signed)
° °  Subjective:    Patient ID: [name], female    DOB: 01/18/1951, 71 y.o.   MRN: [id number]  HPI [name] is a 71 year old female with a history of GERD with esophagitis, chronic constipation, colonic diverticulosis, history of breast cancer, chronic back pain, CAD who is here for follow-up.  She is seen alone today.  I last saw her in the office in October and then for upper endoscopy in November 2022.  EGD was performed on 07/26/2021 which showed healed esophagitis.  A few benign gastric polyps.  Otherwise normal stomach and examined duodenum.  She had esophagitis with ulceration seen in June 2022 after [place].  She has been maintained on pantoprazole 40 mg twice daily.  Her heartburn has been well controlled though she has intermittent belching symptom.  Her biggest complaint of late has been sharp and stabbing abdominal pain.  This typically is daily but some days are worse than others.  Tends to be triggered by eating.  No nausea or vomiting.  Eating predominantly 1 meal a day which is breakfast.  Appetite feels suppressed to her.  Bowel movements vary from 1/day normal and formed to loose stools several times per day.  No greasy stools, black tarry stools or blood in stool.  She does report abdominal bloating symptom.  Review of Systems As per HPI, otherwise negative  Current Medications, Allergies, Past Medical History, Past Surgical History, Family History and Social History were reviewed in [employer] record.    Objective:   Physical Exam BP 120/70    Pulse 76    Ht 5\' 3"  (1.6 m)    Wt 179 lb (81.2 kg)    BMI 31.71 kg/m  Gen: awake, alert, NAD HEENT: anicteric CV: RRR, no mrg Pulm: CTA b/l Abd: soft, diffusely tender without rebound or guarding, ND +BS throughout Ext: no c/c/e Neuro: nonfocal      Assessment & Plan:   71 year old female with a history of GERD with esophagitis, chronic constipation, colonic  diverticulosis, history of breast cancer, chronic back pain, CAD who is here for follow-up.   Abd pain/bloating --she is up-to-date with upper endoscopy and colonoscopy.  Her esophagitis is healed.  I recommended imaging and lab work --CBC, CMP, TSH, TTG --CT scan considered but she has contrast allergy but also listed allergy to prednisone; thus we have opted for MRI abdomen with and without contrast --Levsin 0.125 mg sublingual every 6 hours as needed abdominal pain and spasm  2.  IBS --history of constipation but now intermittent loose stool; evaluation as above.  3.  GERD with history of esophagitis --documented to have healed with twice daily PPI, recommend to continue pantoprazole 40 mg twice daily for now.  Consider backing down to once a day on follow-up.  4.  CRC screening --normal colonoscopy March 2021, repeat recommended March 2031  30 minutes total spent today including patient facing time, coordination of care, reviewing medical history/procedures/pertinent radiology studies, and documentation of the encounter.

## 2021-10-26 NOTE — Patient Instructions (Signed)
Your provider has requested that you go to the basement level for lab work before leaving today. Press "B" on the elevator. The lab is located at the first door on the left as you exit the elevator.  We have sent the following medications to your pharmacy for you to pick up at your convenience: Levsin SL- 1 tablet under the tongue four times daily as needed.  You have been scheduled for an MRI of the abdomen and pelvis at 8:30 am on 11/03/21 at [facility]. Your appointment time is 9:00 am. Please arrive to admitting (at main entrance of the hospital) 30 minutes prior to your appointment time for registration purposes. Please make certain not to have anything to eat or drink 6 hours prior to your test. In addition, if you have any metal in your body, have a pacemaker or defibrillator, please be sure to let your ordering physician know. This test typically takes 45 minutes to 1 hour to complete. Should you need to reschedule, please call [phone] to do so.] If you are age 79 or older, your body mass index should be between 23-30. Your Body mass index is 31.71 kg/m. If this is out of the aforementioned range listed, please consider follow up with your Primary Care Provider.  If you are age 76 or younger, your body mass index should be between 19-25. Your Body mass index is 31.71 kg/m. If this is out of the aformentioned range listed, please consider follow up with your Primary Care Provider.   ________________________________________________________  The [facility] providers would like to encourage you to use [facility] to communicate with providers for non-urgent requests or questions.  Due to long hold times on the telephone, sending your provider a message by [facility] may be a faster and more efficient way to get a response.  Please allow 48 business hours for a response.  Please remember that this is for non-urgent requests.  _______________________________________________________ Due to  recent changes in healthcare laws, you may see the results of your imaging and laboratory studies on MyChart before your provider has had a chance to review them.  We understand that in some cases there may be results that are confusing or concerning to you. Not all laboratory results come back in the same time frame and the provider may be waiting for multiple results in order to interpret others.  Please give [place] 48 hours in order for your provider to thoroughly review all the results before contacting the office for clarification of your results.

## 2021-10-27 LAB — COMPREHENSIVE METABOLIC PANEL
ALT: 11 U/L (ref 0–35)
AST: 13 U/L (ref 0–37)
Albumin: 4.3 g/dL (ref 3.5–5.2)
Alkaline Phosphatase: 53 U/L (ref 39–117)
BUN: 20 mg/dL (ref 6–23)
CO2: 33 mEq/L — ABNORMAL HIGH (ref 19–32)
Calcium: 10.5 mg/dL (ref 8.4–10.5)
Chloride: 102 mEq/L (ref 96–112)
Creatinine, Ser: 0.87 mg/dL (ref 0.40–1.20)
GFR: 68.41 mL/min (ref >60.00)
Glucose, Bld: 126 mg/dL — ABNORMAL HIGH (ref 70–99)
Potassium: 3.4 mEq/L — ABNORMAL LOW (ref 3.5–5.1)
Sodium: 141 mEq/L (ref 135–145)
Total Bilirubin: 0.8 mg/dL (ref 0.2–1.2)
Total Protein: 6.9 g/dL (ref 6.0–8.3)

## 2021-10-27 LAB — TISSUE TRANSGLUTAMINASE, IGA: (tTG) Ab, IgA: <1 U/mL

## 2021-11-02 ENCOUNTER — Ambulatory Visit (HOSPITAL_COMMUNITY): Payer: Medicare HMO

## 2021-11-03 ENCOUNTER — Other Ambulatory Visit: Payer: Self-pay

## 2021-11-03 ENCOUNTER — Ambulatory Visit (HOSPITAL_COMMUNITY)
Admission: RE | Admit: 2021-11-03 | Discharge: 2021-11-03 | Disposition: A | Discharge status: Home / Self Care | Payer: Medicare HMO | Source: Ambulatory Visit | Attending: Internal Medicine | Admitting: Internal Medicine

## 2021-11-03 ENCOUNTER — Telehealth: Payer: Self-pay | Admitting: Internal Medicine

## 2021-11-03 DIAGNOSIS — R109 Unspecified abdominal pain: Secondary | ICD-10-CM

## 2021-11-03 DIAGNOSIS — R140 Abdominal distension (gaseous): Secondary | ICD-10-CM

## 2021-11-03 DIAGNOSIS — K2100 Gastro-esophageal reflux disease with esophagitis, without bleeding: Secondary | ICD-10-CM

## 2021-11-03 NOTE — Telephone Encounter (Signed)
Inbound call from [facility] requesting a call back stating that they have a question regarding the pt's order. Pt is scheduled for today and has to be there by 8:30am. Please call [phone] and ask for either Horris Latino or [place]. Please advise. Thank you.

## 2021-11-03 NOTE — Telephone Encounter (Signed)
MRI tech spoke with radiologist and got clarification on the orders. Pt could not be scanned today due to her having a neuro stimulator and she left her remote at home. Pt will be rescheduled for the scan. She is to call MRI back and will most likely be done this weekend.

## 2021-11-06 ENCOUNTER — Ambulatory Visit (HOSPITAL_COMMUNITY)
Admission: RE | Admit: 2021-11-06 | Discharge: 2021-11-06 | Disposition: A | Discharge status: Home / Self Care | Payer: Medicare HMO | Source: Ambulatory Visit | Attending: Internal Medicine | Admitting: Internal Medicine

## 2021-11-06 ENCOUNTER — Other Ambulatory Visit: Payer: Self-pay | Admitting: Internal Medicine

## 2021-11-06 DIAGNOSIS — K2100 Gastro-esophageal reflux disease with esophagitis, without bleeding: Secondary | ICD-10-CM

## 2021-11-06 DIAGNOSIS — R140 Abdominal distension (gaseous): Secondary | ICD-10-CM

## 2021-11-06 DIAGNOSIS — R109 Unspecified abdominal pain: Secondary | ICD-10-CM

## 2021-11-06 MED ORDER — GADOBUTROL 1 MMOL/ML IV SOLN
8.0000 mL | Freq: Once | INTRAVENOUS | Status: AC | PRN
  Administered 2021-11-06: 8 mL via INTRAVENOUS

## 2021-11-25 ENCOUNTER — Other Ambulatory Visit: Payer: Self-pay | Admitting: Internal Medicine

## 2021-12-17 ENCOUNTER — Other Ambulatory Visit: Payer: Self-pay | Admitting: Internal Medicine

## 2021-12-20 ENCOUNTER — Other Ambulatory Visit: Payer: Self-pay

## 2021-12-21 ENCOUNTER — Other Ambulatory Visit: Payer: Self-pay | Admitting: Internal Medicine

## 2021-12-21 ENCOUNTER — Encounter: Payer: Self-pay | Admitting: *Deleted

## 2022-02-18 ENCOUNTER — Telehealth: Payer: Self-pay | Admitting: Internal Medicine

## 2022-02-18 NOTE — Telephone Encounter (Signed)
Pt states for 2 weeks she has been having diarrhea 15-20 min after she eats. States she has just been so sick. Pt has not been taking her levsin or tried any imodium. Discussed with pt that she needs to take the levsin as prescribed and take Imodium to see if that helps with her diarrhea and her abd pain. Pt has OV scheduled later this month. Pt verbalized understanding.

## 2022-02-18 NOTE — Telephone Encounter (Signed)
Inbound call from patient stating she has had diarrhea for a little over a weeks and has been experiencing abdominal pain. Patient has an upcoming appointment for 03/11/22. Patient would like to speak with a nurse.Please call patient to advise.  Thank you

## 2022-02-26 ENCOUNTER — Other Ambulatory Visit: Payer: Self-pay | Admitting: Internal Medicine

## 2022-03-11 ENCOUNTER — Ambulatory Visit: Payer: Medicare HMO | Admitting: Physician Assistant

## 2022-03-11 ENCOUNTER — Encounter: Payer: Self-pay | Admitting: Physician Assistant

## 2022-03-11 ENCOUNTER — Telehealth: Payer: Self-pay | Admitting: Physician Assistant

## 2022-03-11 VITALS — BP 132/74 | HR 70 | Ht 63.0 in | Wt 169.0 lb

## 2022-03-11 DIAGNOSIS — K580 Irritable bowel syndrome with diarrhea: Secondary | ICD-10-CM | POA: Diagnosis not present

## 2022-03-11 MED ORDER — HYOSCYAMINE SULFATE 0.125 MG SL SUBL: 0.1250 mg | SUBLINGUAL_TABLET | Freq: Four times a day (QID) | SUBLINGUAL | 11 refills | Status: DC

## 2022-03-11 NOTE — Telephone Encounter (Signed)
Return call to patient this afternoon. Pt states that she didn't realize it, but she can not take Hyoscyamine due to her sulfa allergy. She thought that Dr.Pyrtle had prescribed it in the past, but after picking it up from pharmacy today she remembered that this is one of the medications that she could not take.  Pt would like to know if this could be changed to something different? I have added this medication to her list of allergies.
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
## 2022-03-11 NOTE — Patient Instructions (Signed)
We have sent the following medications to your pharmacy for you to pick up at your convenience: Hyoscyamine   If you are age 69 or older, your body mass index should be between 23-30. Your Body mass index is 29.94 kg/m. If this is out of the aforementioned range listed, please consider follow up with your Primary Care Provider.  If you are age 85 or younger, your body mass index should be between 19-25. Your Body mass index is 29.94 kg/m. If this is out of the aformentioned range listed, please consider follow up with your Primary Care Provider.   ________________________________________________________  The [facility] providers would like to encourage you to use [facility] to communicate with providers for non-urgent requests or questions.  Due to long hold times on the telephone, sending your provider a message by [facility] may be a faster and more efficient way to get a response.  Please allow 48 business hours for a response.  Please remember that this is for non-urgent requests.  _______________________________________________________  Thank you for choosing me and [facility].  Dennison Bulla

## 2022-03-11 NOTE — Telephone Encounter (Signed)
Patient stated the medication prescribed for her today had sulfa in it and she is highly allergic to sulfa.  Is there an alternative she can take for her diarrhea?  Please call patient and advise.

## 2022-03-11 NOTE — Progress Notes (Signed)
Addendum: Reviewed and agree with assessment and management plan. Jishnu Jenniges M, MD  

## 2022-03-14 MED ORDER — DICYCLOMINE HCL 10 MG PO CAPS: 10.0000 mg | ORAL_CAPSULE | Freq: Three times a day (TID) | ORAL | 0 refills | Status: DC

## 2022-03-14 NOTE — Telephone Encounter (Signed)
We can try Dicyclomine instead 10 mg QID for now and increase dose if needed, thanks-JLL

## 2022-03-14 NOTE — Telephone Encounter (Signed)
Prescription sent

## 2022-03-14 NOTE — Telephone Encounter (Signed)
Called and spoke with patient regarding Jennifer's recommendations. Pt would like RX sent to [facility] on file.   Anderson [place], looks like Dicyclomine is also listed on patient's allergy list. Please advise if OK to proceed with ordering. Thanks

## 2022-04-02 ENCOUNTER — Other Ambulatory Visit: Payer: Self-pay | Admitting: Physician Assistant

## 2022-04-30 ENCOUNTER — Other Ambulatory Visit: Payer: Self-pay | Admitting: Physician Assistant

## 2022-05-19 ENCOUNTER — Ambulatory Visit: Payer: Medicare HMO | Admitting: Podiatry

## 2022-05-30 ENCOUNTER — Other Ambulatory Visit: Payer: Self-pay | Admitting: Nurse Practitioner

## 2022-05-30 DIAGNOSIS — Z1231 Encounter for screening mammogram for malignant neoplasm of breast: Secondary | ICD-10-CM

## 2022-07-04 ENCOUNTER — Ambulatory Visit
Admission: RE | Admit: 2022-07-04 | Discharge: 2022-07-04 | Disposition: A | Discharge status: Home / Self Care | Payer: Medicare HMO | Source: Ambulatory Visit | Attending: Nurse Practitioner | Admitting: Nurse Practitioner

## 2022-07-04 DIAGNOSIS — Z1231 Encounter for screening mammogram for malignant neoplasm of breast: Secondary | ICD-10-CM

## 2022-07-13 ENCOUNTER — Encounter: Payer: Self-pay | Admitting: Internal Medicine

## 2022-07-13 ENCOUNTER — Ambulatory Visit: Payer: Medicare HMO | Admitting: Internal Medicine

## 2022-07-13 VITALS — BP 148/68 | HR 74 | Ht 63.0 in | Wt 167.4 lb

## 2022-07-13 DIAGNOSIS — K638219 Small intestinal bacterial overgrowth, unspecified: Secondary | ICD-10-CM | POA: Diagnosis not present

## 2022-07-13 DIAGNOSIS — R197 Diarrhea, unspecified: Secondary | ICD-10-CM

## 2022-07-13 DIAGNOSIS — R143 Flatulence: Secondary | ICD-10-CM

## 2022-07-13 DIAGNOSIS — K2100 Gastro-esophageal reflux disease with esophagitis, without bleeding: Secondary | ICD-10-CM

## 2022-07-13 DIAGNOSIS — R140 Abdominal distension (gaseous): Secondary | ICD-10-CM | POA: Diagnosis not present

## 2022-07-13 MED ORDER — METRONIDAZOLE 250 MG PO TABS: 250.0000 mg | ORAL_TABLET | Freq: Three times a day (TID) | ORAL | 0 refills | Status: AC

## 2022-07-13 NOTE — Patient Instructions (Addendum)
_______________________________________________________  If you are age 69 or older, your body mass index should be between 23-30. Your Body mass index is 29.65 kg/m. If this is out of the aforementioned range listed, please consider follow up with your Primary Care Provider.  If you are age 35 or younger, your body mass index should be between 19-25. Your Body mass index is 29.65 kg/m. If this is out of the aformentioned range listed, please consider follow up with your Primary Care Provider.   ________________________________________________________  The [facility] providers would like to encourage you to use [facility] to communicate with providers for non-urgent requests or questions.  Due to long hold times on the telephone, sending your provider a message by [facility] may be a faster and more efficient way to get a response.  Please allow 48 business hours for a response.  Please remember that this is for non-urgent requests.  _______________________________________________________  We have sent the following medications to your pharmacy for you to pick up at your convenience:  START: Flagyl '250mg'$  one tablet three times daily for 10 days.  Please purchase the following medications over the counter and take as directed:  START: Benefiber one tablespoon daily working up to two tablespoons daily.  You are scheduled to follow up on September 22, 2022 at 2:30pm.  Thank you for entrusting me with your care and choosing [facility].  Dr Hilarie Fredrickson
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
## 2022-07-21 ENCOUNTER — Encounter: Payer: Medicare HMO | Admitting: Adult Health

## 2022-08-07 ENCOUNTER — Other Ambulatory Visit: Payer: Self-pay | Admitting: Internal Medicine

## 2022-08-11 ENCOUNTER — Inpatient Hospital Stay: Payer: Medicare HMO | Attending: Adult Health | Admitting: Adult Health

## 2022-08-11 ENCOUNTER — Encounter: Payer: Self-pay | Admitting: Adult Health

## 2022-08-11 ENCOUNTER — Other Ambulatory Visit: Payer: Self-pay

## 2022-08-11 VITALS — BP 154/87 | HR 79 | Temp 97.8°F | Resp 16 | Wt 169.4 lb

## 2022-08-11 DIAGNOSIS — Z853 Personal history of malignant neoplasm of breast: Secondary | ICD-10-CM | POA: Insufficient documentation

## 2022-08-11 DIAGNOSIS — Z9221 Personal history of antineoplastic chemotherapy: Secondary | ICD-10-CM | POA: Diagnosis not present

## 2022-08-11 DIAGNOSIS — Z923 Personal history of irradiation: Secondary | ICD-10-CM | POA: Diagnosis not present

## 2022-08-11 DIAGNOSIS — Z170 Estrogen receptor positive status [ER+]: Secondary | ICD-10-CM

## 2022-08-11 DIAGNOSIS — C50211 Malignant neoplasm of upper-inner quadrant of right female breast: Secondary | ICD-10-CM
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
## 2022-08-12 NOTE — Assessment & Plan Note (Signed)
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  Vaughan Basta has no clinical or radiographic signs of breast cancer recurrence.  I recommended that she continue to undergo annual mammograms next due in October 2024.  Spent a long time today talking about the GI issues she has been having and her experience.  I am hopeful that she will obtain some answers with her upcoming appointment with Dr. Wynetta Emery next week.  I gave her a copy of my card so they can fax me a note and let her know that if there is anything we can do to help with anything to please let me know.  We will see her back in 1 year for continued long-term follow-up.

## 2022-08-31 ENCOUNTER — Telehealth: Payer: Self-pay | Admitting: *Deleted

## 2022-08-31 NOTE — Telephone Encounter (Signed)
Incoming call from patient informed her of cancellation.

## 2022-08-31 NOTE — Telephone Encounter (Signed)
I have left a message for patient indicating that we are cancelling the previously scheduled appointment she had with Dr Hilarie Fredrickson on 09/22/22 since it appears she has changed GI care to [facility] (had office visit with them on 08/16/22 and procedure with them on 08/26/22).

## 2022-09-22 ENCOUNTER — Ambulatory Visit: Payer: Medicare HMO | Admitting: Internal Medicine

## 2022-10-06 ENCOUNTER — Encounter: Payer: Self-pay | Admitting: Diagnostic Neuroimaging

## 2022-10-06 ENCOUNTER — Ambulatory Visit: Payer: Medicare HMO | Admitting: Diagnostic Neuroimaging

## 2022-10-06 VITALS — BP 139/80 | HR 91 | Ht 63.0 in | Wt 172.0 lb

## 2022-10-06 DIAGNOSIS — F0781 Postconcussional syndrome: Secondary | ICD-10-CM

## 2022-10-06 DIAGNOSIS — H5319 Other subjective visual disturbances: Secondary | ICD-10-CM

## 2022-10-06 DIAGNOSIS — R42 Dizziness and giddiness: Secondary | ICD-10-CM

## 2022-10-06 DIAGNOSIS — M48062 Spinal stenosis, lumbar region with neurogenic claudication: Secondary | ICD-10-CM | POA: Diagnosis not present
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
## 2022-10-07 ENCOUNTER — Telehealth: Payer: Self-pay | Admitting: Diagnostic Neuroimaging

## 2022-10-07 NOTE — Telephone Encounter (Signed)
[employer] medicare sent to [facility] they obtain auth [id number] 

## 2022-10-28 ENCOUNTER — Ambulatory Visit
Admission: RE | Admit: 2022-10-28 | Discharge: 2022-10-28 | Disposition: A | Discharge status: Home / Self Care | Payer: Medicare HMO | Source: Ambulatory Visit | Attending: Diagnostic Neuroimaging | Admitting: Diagnostic Neuroimaging

## 2022-10-28 DIAGNOSIS — F0781 Postconcussional syndrome: Secondary | ICD-10-CM

## 2022-10-28 DIAGNOSIS — H5319 Other subjective visual disturbances: Secondary | ICD-10-CM

## 2022-10-28 DIAGNOSIS — R42 Dizziness and giddiness: Secondary | ICD-10-CM

## 2022-10-28 MED ORDER — GADOPICLENOL 0.5 MMOL/ML IV SOLN
7.5000 mL | Freq: Once | INTRAVENOUS | Status: AC | PRN
  Administered 2022-10-28: 7.5 mL via INTRAVENOUS

## 2022-11-14 ENCOUNTER — Telehealth: Payer: Self-pay

## 2022-11-14 NOTE — Telephone Encounter (Signed)
Contacted pt, VM not set up

## 2022-11-14 NOTE — Telephone Encounter (Signed)
-----   Message from Penni Bombard, MD sent at 11/10/2022  6:02 PM EST ----- No acute findings. Old changes / infarcts noted. Continue aspirin, BP control, sugar control, statin. -VRP

## 2022-11-15 NOTE — Telephone Encounter (Signed)
Patient requested copy of MRI to be mailed to her. Can you send her records please?  Thanks

## 2022-11-15 NOTE — Telephone Encounter (Signed)
Contacted pt again, informed her MRI showed  No acute or major findings. Old changes / infarcts noted. Advised to continue aspirin, BP control, sugar control, statin.  Advised to call the office back with any questions or concerns as she had none at this time. Patient verbally understood and was appreciative.

## 2023-02-03 ENCOUNTER — Other Ambulatory Visit: Payer: Self-pay | Admitting: Family Medicine

## 2023-02-03 DIAGNOSIS — M48061 Spinal stenosis, lumbar region without neurogenic claudication: Secondary | ICD-10-CM

## 2023-02-03 DIAGNOSIS — R32 Unspecified urinary incontinence: Secondary | ICD-10-CM

## 2023-02-03 DIAGNOSIS — M5416 Radiculopathy, lumbar region: Secondary | ICD-10-CM

## 2023-03-22 ENCOUNTER — Ambulatory Visit
Admission: RE | Admit: 2023-03-22 | Discharge: 2023-03-22 | Disposition: A | Discharge status: Home / Self Care | Payer: Medicare HMO | Source: Ambulatory Visit | Attending: Family Medicine | Admitting: Family Medicine

## 2023-03-22 DIAGNOSIS — R32 Unspecified urinary incontinence: Secondary | ICD-10-CM

## 2023-03-22 DIAGNOSIS — M5416 Radiculopathy, lumbar region: Secondary | ICD-10-CM

## 2023-03-22 DIAGNOSIS — M48061 Spinal stenosis, lumbar region without neurogenic claudication: Secondary | ICD-10-CM

## 2023-03-23 ENCOUNTER — Ambulatory Visit
Admission: RE | Admit: 2023-03-23 | Discharge: 2023-03-23 | Disposition: A | Discharge status: Home / Self Care | Payer: Medicare HMO | Source: Ambulatory Visit | Attending: Family Medicine | Admitting: Family Medicine

## 2023-03-23 MED ORDER — GADOPICLENOL 0.5 MMOL/ML IV SOLN
7.5000 mL | Freq: Once | INTRAVENOUS | Status: AC | PRN
  Administered 2023-03-23: 7.5 mL via INTRAVENOUS

## 2023-06-14 ENCOUNTER — Telehealth: Payer: Self-pay | Admitting: Adult Health

## 2023-06-14 NOTE — Telephone Encounter (Signed)
Patient is aware of rescheduled appointment times/dates due to provider being out of office on 08/14/2023

## 2023-06-19 ENCOUNTER — Other Ambulatory Visit: Payer: Self-pay | Admitting: Family

## 2023-06-19 DIAGNOSIS — Z1231 Encounter for screening mammogram for malignant neoplasm of breast: Secondary | ICD-10-CM

## 2023-07-11 ENCOUNTER — Ambulatory Visit
Admission: RE | Admit: 2023-07-11 | Discharge: 2023-07-11 | Disposition: A | Discharge status: Home / Self Care | Payer: Medicare HMO | Source: Ambulatory Visit | Attending: Family | Admitting: Family

## 2023-07-11 DIAGNOSIS — Z1231 Encounter for screening mammogram for malignant neoplasm of breast: Secondary | ICD-10-CM

## 2023-07-12 ENCOUNTER — Other Ambulatory Visit: Payer: Self-pay | Admitting: Hematology and Oncology

## 2023-07-12 DIAGNOSIS — N63 Unspecified lump in unspecified breast: Secondary | ICD-10-CM

## 2023-07-26 ENCOUNTER — Other Ambulatory Visit: Payer: Medicare HMO

## 2023-07-26 ENCOUNTER — Ambulatory Visit
Admission: RE | Admit: 2023-07-26 | Discharge: 2023-07-26 | Disposition: A | Discharge status: Home / Self Care | Payer: Medicare HMO | Source: Ambulatory Visit | Attending: Hematology and Oncology

## 2023-07-26 ENCOUNTER — Ambulatory Visit
Admission: RE | Admit: 2023-07-26 | Discharge: 2023-07-26 | Disposition: A | Discharge status: Home / Self Care | Payer: Medicare HMO | Source: Ambulatory Visit | Attending: Hematology and Oncology | Admitting: Hematology and Oncology

## 2023-07-26 DIAGNOSIS — N63 Unspecified lump in unspecified breast: Secondary | ICD-10-CM

## 2023-08-14 ENCOUNTER — Encounter: Payer: Medicare HMO | Admitting: Adult Health

## 2023-08-15 ENCOUNTER — Inpatient Hospital Stay: Payer: Medicare HMO | Attending: Adult Health | Admitting: Adult Health
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
## 2023-10-02 NOTE — Telephone Encounter (Signed)
Error

## 2024-07-15 ENCOUNTER — Other Ambulatory Visit: Payer: Self-pay | Admitting: Diagnostic Radiology

## 2024-07-15 DIAGNOSIS — Z1231 Encounter for screening mammogram for malignant neoplasm of breast: Secondary | ICD-10-CM

## 2024-08-05 ENCOUNTER — Ambulatory Visit
Admission: RE | Admit: 2024-08-05 | Discharge: 2024-08-05 | Disposition: A | Discharge status: Home / Self Care | Source: Ambulatory Visit | Attending: Diagnostic Radiology | Admitting: Diagnostic Radiology

## 2024-08-05 DIAGNOSIS — Z1231 Encounter for screening mammogram for malignant neoplasm of breast: Secondary | ICD-10-CM
